# Patient Record
Sex: Female | Born: 1965 | Race: White | Hispanic: No | Marital: Married | Smoking: Former smoker
Health system: Southern US, Community
[De-identification: ages and names within clinical notes are randomized; demographics above are authoritative.]

## PROBLEM LIST (undated history)

## (undated) DIAGNOSIS — M2550 Pain in unspecified joint: Secondary | ICD-10-CM

## (undated) DIAGNOSIS — K829 Disease of gallbladder, unspecified: Secondary | ICD-10-CM

## (undated) DIAGNOSIS — Z972 Presence of dental prosthetic device (complete) (partial): Secondary | ICD-10-CM

## (undated) DIAGNOSIS — Z973 Presence of spectacles and contact lenses: Secondary | ICD-10-CM

## (undated) DIAGNOSIS — M1990 Unspecified osteoarthritis, unspecified site: Secondary | ICD-10-CM

## (undated) DIAGNOSIS — S83206A Unspecified tear of unspecified meniscus, current injury, right knee, initial encounter: Secondary | ICD-10-CM

## (undated) DIAGNOSIS — Z9889 Other specified postprocedural states: Secondary | ICD-10-CM

## (undated) DIAGNOSIS — R112 Nausea with vomiting, unspecified: Secondary | ICD-10-CM

## (undated) DIAGNOSIS — C50112 Malignant neoplasm of central portion of left female breast: Principal | ICD-10-CM

## (undated) DIAGNOSIS — F329 Major depressive disorder, single episode, unspecified: Secondary | ICD-10-CM

## (undated) DIAGNOSIS — E119 Type 2 diabetes mellitus without complications: Secondary | ICD-10-CM

## (undated) DIAGNOSIS — H3320 Serous retinal detachment, unspecified eye: Secondary | ICD-10-CM

## (undated) DIAGNOSIS — K589 Irritable bowel syndrome without diarrhea: Secondary | ICD-10-CM

## (undated) DIAGNOSIS — R7303 Prediabetes: Secondary | ICD-10-CM

## (undated) DIAGNOSIS — R3915 Urgency of urination: Secondary | ICD-10-CM

## (undated) DIAGNOSIS — Z8742 Personal history of other diseases of the female genital tract: Secondary | ICD-10-CM

## (undated) DIAGNOSIS — F32A Depression, unspecified: Secondary | ICD-10-CM

## (undated) DIAGNOSIS — F411 Generalized anxiety disorder: Secondary | ICD-10-CM

## (undated) DIAGNOSIS — D649 Anemia, unspecified: Secondary | ICD-10-CM

## (undated) DIAGNOSIS — H269 Unspecified cataract: Secondary | ICD-10-CM

## (undated) DIAGNOSIS — F419 Anxiety disorder, unspecified: Secondary | ICD-10-CM

## (undated) DIAGNOSIS — I10 Essential (primary) hypertension: Secondary | ICD-10-CM

## (undated) DIAGNOSIS — N83209 Unspecified ovarian cyst, unspecified side: Secondary | ICD-10-CM

## (undated) HISTORY — PX: LAPAROSCOPIC APPENDECTOMY: SUR753

## (undated) HISTORY — DX: Malignant neoplasm of central portion of left female breast: C50.112

## (undated) HISTORY — DX: Disease of gallbladder, unspecified: K82.9

## (undated) HISTORY — DX: Pain in unspecified joint: M25.50

## (undated) HISTORY — DX: Anemia, unspecified: D64.9

## (undated) HISTORY — PX: APPENDECTOMY: SHX54

## (undated) HISTORY — PX: LAPAROSCOPIC CHOLECYSTECTOMY: SUR755

## (undated) HISTORY — PX: BILATERAL TOTAL MASTECTOMY WITH AXILLARY LYMPH NODE DISSECTION: SHX6364

## (undated) HISTORY — DX: Unspecified osteoarthritis, unspecified site: M19.90

## (undated) HISTORY — DX: Serous retinal detachment, unspecified eye: H33.20

## (undated) HISTORY — PX: ACHILLES TENDON REPAIR: SUR1153

## (undated) HISTORY — DX: Anxiety disorder, unspecified: F41.9

## (undated) HISTORY — PX: OTHER SURGICAL HISTORY: SHX169

## (undated) HISTORY — DX: Irritable bowel syndrome, unspecified: K58.9

## (undated) HISTORY — PX: ELBOW SURGERY: SHX618

---
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
## 1989-09-08 HISTORY — PX: TUBAL LIGATION: SHX77

## 1996-05-09 HISTORY — PX: TOTAL ABDOMINAL HYSTERECTOMY: SHX209

## 1996-05-09 HISTORY — PX: ABDOMINAL HYSTERECTOMY: SHX81

## 1998-08-10 ENCOUNTER — Emergency Department (HOSPITAL_COMMUNITY): Admission: EM | Admit: 1998-08-10 | Discharge: 1998-08-10 | Payer: Self-pay | Admitting: Unknown Physician Specialty

## 1998-09-08 HISTORY — PX: BLADDER SUSPENSION: SHX72

## 1999-10-04 ENCOUNTER — Inpatient Hospital Stay (HOSPITAL_COMMUNITY): Admission: AD | Admit: 1999-10-04 | Discharge: 1999-10-04 | Payer: Self-pay | Admitting: Obstetrics & Gynecology

## 2000-04-10 ENCOUNTER — Emergency Department (HOSPITAL_COMMUNITY): Admission: EM | Admit: 2000-04-10 | Discharge: 2000-04-10 | Payer: Self-pay | Admitting: Emergency Medicine

## 2000-10-23 ENCOUNTER — Emergency Department (HOSPITAL_COMMUNITY): Admission: EM | Admit: 2000-10-23 | Discharge: 2000-10-23 | Payer: Self-pay | Admitting: Emergency Medicine

## 2001-05-07 ENCOUNTER — Other Ambulatory Visit: Admission: RE | Admit: 2001-05-07 | Discharge: 2001-05-07 | Payer: Self-pay | Admitting: Obstetrics and Gynecology

## 2004-10-29 ENCOUNTER — Ambulatory Visit: Payer: Self-pay | Admitting: Internal Medicine

## 2004-11-14 ENCOUNTER — Ambulatory Visit: Payer: Self-pay | Admitting: Internal Medicine

## 2004-12-25 ENCOUNTER — Ambulatory Visit: Payer: Self-pay | Admitting: Internal Medicine

## 2004-12-26 ENCOUNTER — Ambulatory Visit (HOSPITAL_COMMUNITY): Admission: RE | Admit: 2004-12-26 | Discharge: 2004-12-26 | Payer: Self-pay | Admitting: Family Medicine

## 2005-01-31 ENCOUNTER — Ambulatory Visit: Payer: Self-pay | Admitting: Family Medicine

## 2005-02-19 ENCOUNTER — Ambulatory Visit (HOSPITAL_COMMUNITY): Admission: RE | Admit: 2005-02-19 | Discharge: 2005-02-19 | Payer: Self-pay | Admitting: *Deleted

## 2008-09-08 HISTORY — PX: ACHILLES TENDON REPAIR: SUR1153

## 2009-09-03 ENCOUNTER — Encounter: Admission: RE | Admit: 2009-09-03 | Discharge: 2009-09-03 | Payer: Self-pay | Admitting: Family Medicine

## 2010-09-05 ENCOUNTER — Encounter
Admission: RE | Admit: 2010-09-05 | Discharge: 2010-09-05 | Payer: Self-pay | Source: Home / Self Care | Attending: Family Medicine | Admitting: Family Medicine

## 2011-01-24 NOTE — Group Therapy Note (Signed)
NAME:  Christina [name], Christina [name] NO.:  [id number]   MEDICAL RECORD NO.:  [id number]          PATIENT TYPE:  WOC   LOCATION:  [facility]                   FACILITY:  [facility]   PHYSICIAN:  Tinnie Gens, MD        DATE OF BIRTH:  16-May-1966   DATE OF SERVICE:                                    CLINIC NOTE   CHIEF COMPLAINT:  Right ovarian cyst.   HISTORY OF PRESENT ILLNESS:  The patient is a 45 year old gravida 4, para 3,  0-1-3, who is status post hysterectomy for endometriosis in 1996.  She has  had several recurrent problems with ovarian cysts, the latest of which was  diagnosed in April and was found to be a right-sided complex 5.9 x 4.9 x 4.2  cm cyst with clot in it.  The patient states she had acute onset of pain  then, but has gotten somewhat better and actually is asymptomatic at  present.  The patient is just wondering if there is something she can do  about this because it continues to be a problem.   The patient additionally today, is complaining of some difficulty with  weight loss, despite a lot of different methods that she has tried. She  states that she walks a lot and drinks Pepsi.  But some of it is diet.  She  also snacks, but only a few times a week.  She has tried Dexatrim and other  over-the-counter methods and has not been able to lose enough weight.   The patient also reports some leakage of fluid and incontinence, especially  with sneezing and coughing, but she does have problems with some urge  incontinence and what sounds like bladder spasm as well.   PAST MEDICAL HISTORY:  1.  Elevated blood pressure.  2.  Possibly and episode of PVCs.   PAST SURGICAL HISTORY:  1.  C section x3.  2.  Hysterectomy.  3.  Gallbladder removed.  4.  Several laparoscopies for different things.   MEDICATIONS:  None.   ALLERGIES:  None known.   GYNECOLOGIC HISTORY:  1.  Menarche at age 89.  2.  No more cycles secondary to hysterectomy.  3.  History of  endometriosis  4.  No history of abnormal Pap.   OBSTETRIC HISTORY:  1.  G4, P3.  2.  Three previous C sections.   FAMILY HISTORY:  Diabetes, coronary artery disease, hypertension.   SOCIAL HISTORY:  No tobacco, alcohol or drug use.  She does work.   A 14 point review of systems is reviewed and is positive for swelling in the  legs, problems with vision, some chest pain that she sees the outpatient  clinic for, loss of urine with coughing, sneezing and pain with intercourse.  Otherwise, this is negative.   PHYSICAL EXAMINATION:  VITAL SIGNS:  As noted in the chart.  Her weight is  215 pounds.  GENERAL:  She is a well-developed, well-nourished, moderately obese white  female in no acute distress.  GU:  Normal external female genitalia. The vagina is pink and rugated.  The  cervix and uterus  are surgically absent.  On bimanual exam, there is no  significant mass or tenderness noted.  With Valsalva, there is no  significant descent of the vaginal cuff.  No cystocele nor rectocele was  appreciated.   IMPRESSION:  1.  Recurrent ovarian cysts, hemorrhagic in nature, asymptomatic at present.  2.  History of endometriosis.  3.  Urinary incontinence.   PLAN:  1.  Will follow up her pelvic ultrasound to make sure the cyst as resolved.  2.  Start Mircette to promote ovarian quiescence.  3.  The patient is to see a urologist.  4.  Lots of weight loss techniques were discussed with this patient.  15      minutes were used to talk about her current diet and exercise, muscle      building types of exercise.  5.  The patient, who had a hysterectomy for nondysplastic or cervical cancer      reasons, does not need further vaginal cytologies.  6.  The patient will followup with me in three months to see how her birth      control is doing.  I did advise her that this may increase her blood      pressure.  She will continue to see Dr. Elvin So for followup and      treatment of this.       TP/MEDQ  D:  01/31/2005  T:  01/31/2005  Job:  [id number]   cc:   Cassandria Anger, Dr  Outpatient Clinic

## 2011-02-12 ENCOUNTER — Other Ambulatory Visit: Payer: Self-pay | Admitting: Obstetrics & Gynecology

## 2011-02-12 DIAGNOSIS — R1900 Intra-abdominal and pelvic swelling, mass and lump, unspecified site: Secondary | ICD-10-CM

## 2011-02-17 ENCOUNTER — Other Ambulatory Visit: Payer: Self-pay

## 2011-02-18 ENCOUNTER — Ambulatory Visit
Admission: RE | Admit: 2011-02-18 | Discharge: 2011-02-18 | Disposition: A | Discharge status: Home / Self Care | Payer: Self-pay | Source: Ambulatory Visit | Attending: Obstetrics & Gynecology | Admitting: Obstetrics & Gynecology

## 2011-02-18 DIAGNOSIS — R1900 Intra-abdominal and pelvic swelling, mass and lump, unspecified site: Secondary | ICD-10-CM

## 2011-02-18 MED ORDER — GADOBENATE DIMEGLUMINE 529 MG/ML IV SOLN
15.0000 mL | Freq: Once | INTRAVENOUS | Status: AC | PRN
  Administered 2011-02-18: 15 mL via INTRAVENOUS

## 2011-03-11 ENCOUNTER — Ambulatory Visit (INDEPENDENT_AMBULATORY_CARE_PROVIDER_SITE_OTHER): Payer: BC Managed Care – PPO | Admitting: Surgery

## 2011-03-11 ENCOUNTER — Encounter (INDEPENDENT_AMBULATORY_CARE_PROVIDER_SITE_OTHER): Payer: Self-pay | Admitting: Surgery

## 2011-03-11 VITALS — BP 122/86 | HR 58 | Temp 98.1°F | Ht 67.0 in | Wt 174.6 lb

## 2011-03-11 DIAGNOSIS — N809 Endometriosis, unspecified: Secondary | ICD-10-CM | POA: Insufficient documentation

## 2011-03-11 DIAGNOSIS — K388 Other specified diseases of appendix: Secondary | ICD-10-CM

## 2011-03-11 DIAGNOSIS — K389 Disease of appendix, unspecified: Secondary | ICD-10-CM

## 2011-03-11 NOTE — Patient Instructions (Signed)
Schedule for surgery to laparoscopically explore abdomen and remove appendix. Appendectomy, Adult Care Before and After Surgery Once the diagnosis of appendiceal mucocele, the procedure is to remove the appendix. This is done surgically by making a cut (incision) in the right lower belly. This is most often done under a general anesthetic so that you are sleeping during the procedure. Following the procedure, you will be taken to a recovery room. Once you have recovered from the anesthetic you will be returned to your room. Your caregiver will tell you how long you will stay in the hospital. Following the procedure you will be given pain medications to keep you comfortable. BEFORE SURGERY LET YOUR CAREGIVERS KNOW ABOUT THE FOLLOWING:  Allergies including aspirin and latex.  Medications taken including herbs, eye drops, over the counter medications, and creams.   Use of steroids (by mouth or creams).   Previous problems with anesthetics or novocain.   Previous surgery.   Possibility of pregnancy, if this applies.  History of blood clots (thrombophlebitis).   History of bleeding or blood problems.   Other health problems.   AFTER YOUR SURGERY After surgery, you will be taken to the recovery area where a nurse will watch you and check your progress. Once you're awake, stable, and taking fluids well, without other problems you'll be returned to your room or allowed to go home. HOME CARE INSTRUCTIONS AFTER YOUR SURGERY (APPENDECTOMY)  Once you are home an ice pack applied to the operative site may help with discomfort and keep swelling down. Use ice for ONLY 15-20 minutes at a time, with rest periods of 2-3 hours between uses. Never place the ice pack directly on your skin; use a towel between the ice pack and your skin.   Change dressings as directed.   Only take over-the-counter or prescription medicines for pain, discomfort, or fever as directed by your caregiver.   You may continue  normal diet as directed.   There should be no heavy lifting (more than 10 pounds) or contact sports for three weeks, or as directed.   Keep the wound dry and clean. The wound may be washed gently with soap and water. Gently blot or dab dry following cleansing without rubbing. Do not take baths, use swimming pools or use hot tubs for ten days, or as instructed by your caregiver.  SEEK MEDICAL CARE IF:  There is redness, swelling, or increasing pain in the wound area.   Pus is coming from the wound.   An unexplained oral temperature above 101 F (38.3 C) develops.   You notice a foul smell coming from the wound or dressing.   The wound edges break open after sutures or staples have been removed.   You develop increasing abdominal pain.  SEEK IMMEDIATE MEDICAL CARE IF:  You develop a rash.   You have difficulty breathing, or develop any reaction or side effects to medications given.  Document Released: 04/08/2004 Document Re-Released: 02/12/2010 [facility] Patient Information 2011 [place], [place].

## 2011-03-11 NOTE — Progress Notes (Signed)
Subjective:     Patient ID: Christina [name], female   DOB: 02/27/1966, 45 y.o.   MRN: [id number]    BP 122/86  Pulse 58  Temp 98.1 F (36.7 C)  Ht 5\' 7"  (1.702 m)  Wt 174 lb 9.6 oz (79.198 kg)  BMI 27.35 kg/m2    HPI  Primary care physician: Leda Quail  Reason for visit: Suprapubic pain with cystic mass. ? Appendiceal mucocele.  Patient is a healthy middle-aged woman. She's had history of endometriosis status post hysterectomy. She has a bowel movement every day, however she was told she has irritable bowel syndrome and gets constipation about one time a month where she won't have about 3 days but usually resolves.  No rectal bleeding or discharge no nausea or vomiting. She notes she does get some suprapubic pain occasionally. It's not associated with eating nor activity. She feel it with pressure applied.  Not a severe sharp stabbing pain. More of a crampy ache. She has lost about 40 pounds but this is been intentional. She's getting more physically active.  Because she noticed this lower abdominal pain, she saw Dr. Hyacinth Meeker. U/S pelvis and noted mild cystic ovaries. However there is a fluid filled structure suprapubic that was of concern. An MRI is suspicious for an appendiceal mucocele. She was sent to me by Dr. Hyacinth Meeker for consideration of surgical removal.  Review of Systems  Constitutional: Negative for fever, chills, diaphoresis, appetite change and fatigue.  HENT: Negative for nosebleeds, sore throat, mouth sores, neck pain and neck stiffness.   Eyes: Negative for photophobia, discharge and visual disturbance.  Respiratory: Negative for cough, choking, chest tightness and shortness of breath.   Cardiovascular: Negative for chest pain and palpitations.  Gastrointestinal: Positive for constipation. Negative for nausea, vomiting, abdominal pain, diarrhea, blood in stool, abdominal distention, anal bleeding and rectal pain.       Constipation 1x/month (No BM for ~3d)    Genitourinary: Negative for dysuria, frequency, flank pain, vaginal bleeding, vaginal discharge and difficulty urinating.  Musculoskeletal: Negative for back pain, arthralgias and gait problem.  Skin: Negative for color change, pallor and rash.  Neurological: Negative for dizziness, speech difficulty, weakness and numbness.  Hematological: Negative for adenopathy. Does not bruise/bleed easily.  Psychiatric/Behavioral: Negative for confusion and agitation. The patient is not nervous/anxious.        Objective:   Physical Exam  Constitutional: She is oriented to person, place, and time. She appears well-developed and well-nourished. No distress.  HENT:  Head: Normocephalic.  Mouth/Throat: Oropharynx is clear and moist. No oropharyngeal exudate.  Eyes: Conjunctivae and EOM are normal. Pupils are equal, round, and reactive to light. No scleral icterus.  Neck: Normal range of motion. Neck supple. No tracheal deviation present.  Cardiovascular: Normal rate, regular rhythm and intact distal pulses.   Pulmonary/Chest: Effort normal and breath sounds normal. No respiratory distress. She exhibits no tenderness.  Abdominal: Soft. Bowel sounds are normal. She exhibits no distension and no mass. There is tenderness. There is no rebound and no guarding. Hernia confirmed negative in the right inguinal area and confirmed negative in the left inguinal area.       Mild suprapubic TTP.  Pfannenstiel incision clean. No hernia.  Genitourinary: No vaginal discharge found.  Musculoskeletal: Normal range of motion. She exhibits no tenderness.  Lymphadenopathy:    She has no cervical adenopathy.       Right: No inguinal adenopathy present.       Left: No inguinal adenopathy present.  Neurological: She is alert and oriented to person, place, and time. No cranial nerve deficit. She exhibits normal muscle tone. Coordination normal.  Skin: Skin is warm and dry. No rash noted. She is not diaphoretic. No erythema.   Psychiatric: She has a normal mood and affect. Her behavior is normal. Judgment and thought content normal.       Assessment:     Cystic mass in suprapubic region by MRI and ultrasound. Seems be coming off cecum, strongly suspicious for appendiceal mucocele.    Plan:     At this point I think it would be wise to do diagnostic laparoscopy, lysis of adhesions, and remove the appendix. I did note that the possibility of this cystic mass is not coming off the appendix but other structures. There's a possibility the right ovary or hydrosalpinx or salpinx and may need to be removed at the same time. However the left ovary seemed free and I will try to avoid using that.  The differential diagnosis was discussed. The technique of surgery was discussed. Risks benefits alternatives discussed. I will try to minimize risk of perforation and therefore some peritonitis pseudomyxei. Questions answered and she agreed to proceed . We will work to coordinate this in a convenient time.

## 2011-03-13 ENCOUNTER — Telehealth (INDEPENDENT_AMBULATORY_CARE_PROVIDER_SITE_OTHER): Payer: Self-pay

## 2011-03-14 ENCOUNTER — Telehealth (INDEPENDENT_AMBULATORY_CARE_PROVIDER_SITE_OTHER): Payer: Self-pay | Admitting: Surgery

## 2011-03-14 ENCOUNTER — Telehealth (INDEPENDENT_AMBULATORY_CARE_PROVIDER_SITE_OTHER): Payer: Self-pay

## 2011-03-17 NOTE — Telephone Encounter (Signed)
Spoke with patient, made aware that with no heavy lifting at her job she could return a week after surgery, sometimes up to two weeks. She will get RTW note after surgery and call with any other questions.

## 2011-04-01 NOTE — Telephone Encounter (Signed)
Duplicate

## 2011-04-01 NOTE — Telephone Encounter (Signed)
Patient would like to know how long she will be out of work due to [employer] append ???

## 2011-04-02 ENCOUNTER — Other Ambulatory Visit (INDEPENDENT_AMBULATORY_CARE_PROVIDER_SITE_OTHER): Payer: Self-pay | Admitting: Surgery

## 2011-04-02 ENCOUNTER — Encounter (HOSPITAL_COMMUNITY): Payer: BC Managed Care – PPO

## 2011-04-02 LAB — CBC
HCT: 40.5 % (ref 36.0–46.0)
Hemoglobin: 13 g/dL (ref 12.0–15.0)
MCH: 28.3 pg (ref 26.0–34.0)
MCHC: 32.1 g/dL (ref 30.0–36.0)
MCV: 88.2 fL (ref 78.0–100.0)
Platelets: 288 10*3/uL (ref 150–400)
RBC: 4.59 MIL/uL (ref 3.87–5.11)
RDW: 12.9 % (ref 11.5–15.5)
WBC: 8.6 10*3/uL (ref 4.0–10.5)

## 2011-04-02 LAB — SURGICAL PCR SCREEN
MRSA, PCR: NEGATIVE
Staphylococcus aureus: NEGATIVE

## 2011-04-07 ENCOUNTER — Other Ambulatory Visit (INDEPENDENT_AMBULATORY_CARE_PROVIDER_SITE_OTHER): Payer: Self-pay | Admitting: Surgery

## 2011-04-07 ENCOUNTER — Ambulatory Visit (HOSPITAL_COMMUNITY)
Admission: RE | Admit: 2011-04-07 | Discharge: 2011-04-07 | Disposition: A | Discharge status: Home / Self Care | Payer: BC Managed Care – PPO | Source: Ambulatory Visit | Attending: Surgery | Admitting: Surgery

## 2011-04-07 DIAGNOSIS — D126 Benign neoplasm of colon, unspecified: Secondary | ICD-10-CM

## 2011-04-07 DIAGNOSIS — D201 Benign neoplasm of soft tissue of peritoneum: Secondary | ICD-10-CM

## 2011-04-07 DIAGNOSIS — Z90710 Acquired absence of both cervix and uterus: Secondary | ICD-10-CM | POA: Insufficient documentation

## 2011-04-07 DIAGNOSIS — N736 Female pelvic peritoneal adhesions (postinfective): Secondary | ICD-10-CM | POA: Insufficient documentation

## 2011-04-07 DIAGNOSIS — D200 Benign neoplasm of soft tissue of retroperitoneum: Secondary | ICD-10-CM

## 2011-04-07 DIAGNOSIS — N949 Unspecified condition associated with female genital organs and menstrual cycle: Secondary | ICD-10-CM | POA: Insufficient documentation

## 2011-04-07 DIAGNOSIS — D490 Neoplasm of unspecified behavior of digestive system: Secondary | ICD-10-CM | POA: Insufficient documentation

## 2011-04-07 DIAGNOSIS — Z01812 Encounter for preprocedural laboratory examination: Secondary | ICD-10-CM | POA: Insufficient documentation

## 2011-04-09 NOTE — Op Note (Signed)
[name], [name] NO.:  [id number]  MEDICAL RECORD NO.:  [id number]  LOCATION:  [facility]                         FACILITY:  [facility]  PHYSICIAN:  Ardeth Sportsman, MD     DATE OF BIRTH:  [birth year]  DATE OF PROCEDURE:  04/07/2011 DATE OF DISCHARGE:                              OPERATIVE REPORT   PRIMARY CARE PHYSICIAN:  Gynecology; Lum Keas, MD  SURGEON:  Ardeth Sportsman, MD  ASSISTANT:  RN  PREOPERATIVE DIAGNOSES: 1. 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 2. Pelvic pain status post hysterectomy and removal for endometriosis     in the past.  POSTOPERATIVE DIAGNOSES: 1. 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 2. No evidence of pseudomyxoma peritonei. 3. Firm peritoneal mass of right sigmoid mesentery (? atrophic or     endometrioma). 4. Dense adhesions in pelvis, status post prior surgery.  PROCEDURE PERFORMED: 1. Lysis of adhesions for 90 minutes. 2. Laparoscopic appendectomy. 3. Excision of peritoneal mass, 5 mm in size.  ANESTHESIA: 1. General anesthesia. 2. Local anesthetic as field block around port sites.  SPECIMENS: 1. Appendix. 2. Peritoneal mass of sigmoid mesentery.  ESTIMATED BLOOD LOSS:  Minimal.  DRAINS:  None.  COMPLICATIONS:  None apparent.  INDICATIONS:  Christina [name] is a 45 year old healthy female who has had history of endometriosis status post hysterectomy.  She does have a history of some intermittent constipation.  She often will get some suprapubic pain.  She had a workup including an ultrasound and MRI, which showed a cystic mass within the appendix suspicious for a mucocele.  She had a small right ovarian cyst that was not too impressive.  Dr. Hyacinth Meeker sent the patient to me for evaluation.  The anatomy and physiology of the digestive tract was explained. Differential diagnosis discussed.  Options discussed.  Recommendation was made for diagnostic laparoscopy with probable removal of appendix. Possibility of other  etiologies and other problems were discussed. Risks, benefits, and alternatives were discussed.  Questions were answered and she agreed to proceed.  OPERATIVE FINDINGS:  She had dense adhesions to the pelvis.  She had a redundant sigmoid colon that was sort of an S-shaped tortuousness with part of it adherent down into the low pelvis.  She had a 5 mm firm spherical mass of the sigmoid mesentery at the base of the right mesentery near the sacral promontory.  It looked almost sort of as a coverless endometrioma.  I did not see endometriosis elsewhere.  I did not see pseudomyxoma peritonei elsewhere.  DESCRIPTION OF PROCEDURE:  Informed consent was confirmed.  The patient underwent general anesthesia without any difficulty.  She received IV antibiotics.  She was positioned in low lithotomy with her arms tucked. Her abdomen was prepped and draped in a sterile fashion.  She had a Foley catheter sterilely placed.  Surgical timeout confirmed our plan.  I placed a 5 mm port through the prior infraumbilical incision using a modified cutdown and optical entry technique.  The entry was clean. Again, inspection revealed no injury.  I induced carbon dioxide insufflation.  Under direct visualization, I placed 5 mm ports in the left lower quadrant and left upper quadrant since she had dense adhesions to  her periumbilical midline and pelvis.  I began with dissection to free the greater omental attachments off the anterior abdominal wall from supraumbilical down towards the suprapubic rim.  I then found freeing that up there were dense adhesions to the sigmoid colon, cecum and small bowel and gradually isolated and freed those off using primarily cold scissors.  There were some thicker bands, so I did use an ultrasonic Harmonic scalpel to help free those off and once I had done that, I freed small intestine off the adhesions to the sigmoid mesentery, pelvis, and adnexa.  With that, I could see a  blind loop leaving the cecum going over towards the left lower quadrant consistent with the appendix.  It was thickened in the middle suspicious for a small mucocele within in.  I did not see any drops of mucus or gelatinous material.  There was no evidence of pseudomyxoma peritonei.  Because it did not look particularly inflamed or kinked and she had chronic pelvic pain and she had dense adhesions to the pelvis, I decided to proceed with lysis of adhesions.  I found sigmoid mesentery and freed that off the left ovary.  It was rather densely adherent to it.  I was able to eventually free that off.  I then freed some colon off first the left ovary, then the right ovary, as well as her severe dense adhesions to that.  The right ovary was cystic and about 3 x 2 x 4 cm in size. However, there was no firm abnormality.  The fimbriae and the fallopian tubes all looked normal.  She is obviously status post hysterectomy.  I began to free up the sigmoid and rectum adhesions off the sidewalls. There were some dense inflammatory adhesions, but I was able to stay off that and free those off and the easier ones, I got them off the ovary where the most dense adhesions were.  With that, I could get over to the peritoneal brim, I could help free the prior hysterectomy vaginal cuff off the anterior rectum as well and get down into the true deep pelvis and that was all clean.  There was 1 dense adhesion of sigmoid down into the deep anterior pelvis and I isolated that and freed that off using cold scissors.  With that, I finally could release the rectosigmoid out of the pelvis.  It was rather a S-shaped dense wad.  I eventually freed up from the mesentery side up to the bowel wall serosa.  I was able to free these intraloop of folded adhesions and help straight out the rectosigmoid.  It was mildly redundant but with that, I could better see the sigmoid colon.  She had some generous epiploic appendages,  but eventually freed everything off.  I did meticulous inspection numerous times and saw no evidence of any serosal tears or any abnormalities of the ovaries or any bowel breach.  I then proceeded with appendectomy.  I was able to isolate the appendix and free the mesoappendix off the appendix.  The entrance end of the cecum was normal sized and more narrow.  I transected the appendix off the cecum using a laparoscopic stapler, sticking a healthy cuff of 5 mm of cecum easily.  I replaced that in Endocatch bag and removed that.  Of note when I was doing dissection, I saw a hard spherical nodule coming off the sigmoid mesentery on the right side and about the level of the sacral promontory.  I was able to elevate  that and free that off using scissors and removed that.  I sent that off.  I saw no other studding, no other mucous deposits or any other abnormalities.  I had upsized the umbilical port to 12 mm for the stapling.  I closed that defect using 0 Vicryl stitch using laparoscopic suture passer.  I did copious irrigation and hemostasis was excellent.  I irrigated over 2 L.  I evacuated carbon dioxide from the ports and closed the tight fascial stitch down and closed the skin.  Sterile dressing applied.  The patient was extubated and sent to recovery room in stable condition. I discussed postoperative care with the patient in detail and I am about to discuss with her family now.     Ardeth Sportsman, MD     SCG/MEDQ  D:  04/07/2011  T:  04/07/2011  Job:  [id number]  cc:   M. Leda Quail, MD Fax: [phone]  Electronically Signed by Karie Soda MD on 04/09/2011 08:56:35 AM

## 2011-04-15 ENCOUNTER — Telehealth (INDEPENDENT_AMBULATORY_CARE_PROVIDER_SITE_OTHER): Payer: Self-pay

## 2011-04-15 NOTE — Telephone Encounter (Signed)
Called pt to get her set up for follow up appt with Dr Michaell Cowing. The pt made an appt for 04-29-11.Hulda Humphrey

## 2011-04-29 ENCOUNTER — Ambulatory Visit (INDEPENDENT_AMBULATORY_CARE_PROVIDER_SITE_OTHER): Payer: BC Managed Care – PPO | Admitting: Surgery

## 2011-04-29 ENCOUNTER — Encounter (INDEPENDENT_AMBULATORY_CARE_PROVIDER_SITE_OTHER): Payer: Self-pay | Admitting: Surgery

## 2011-04-29 VITALS — BP 116/78 | HR 64 | Temp 98.1°F | Wt 174.6 lb

## 2011-04-29 DIAGNOSIS — K388 Other specified diseases of appendix: Secondary | ICD-10-CM

## 2011-04-29 DIAGNOSIS — K389 Disease of appendix, unspecified: Secondary | ICD-10-CM

## 2011-04-29 DIAGNOSIS — D259 Leiomyoma of uterus, unspecified: Secondary | ICD-10-CM

## 2011-04-29 NOTE — Progress Notes (Signed)
Subjective:     Patient ID: Christina [name], female   DOB: 1965/12/27, 45 y.o.   MRN: [id number]  HPI  Reason for visit: Followup after surgery.  The patient feels well. Has a little bit gassy and bloating this. Improving. Daily bowel movements. Off all meds. Past Medical History  Diagnosis Date  . Arthritis     hands and knees   Past Surgical History  Procedure Date  . Cesarean section   . Cholecystectomy   . Achilles tendon repair   . Tubal ligation   . Abdominal hysterectomy   . Appendectomy Aug2012    Early mucocele of appendix, removal of small leiomyoma from pelvis, lysis of adhesions   Current outpatient prescriptions:Sertraline HCl (ZOLOFT PO), Take 75 mg by mouth daily.  , Disp: , Rfl:   No Known Allergies    Review of Systems  Constitutional: Negative for fever, chills and diaphoresis.  HENT: Negative for ear pain, sore throat and trouble swallowing.   Eyes: Negative for photophobia and visual disturbance.  Respiratory: Negative for cough and choking.   Cardiovascular: Negative for chest pain and palpitations.  Gastrointestinal: Negative for nausea, vomiting, abdominal pain, diarrhea, constipation, blood in stool, anal bleeding and rectal pain.  Genitourinary: Negative for dysuria, frequency and difficulty urinating.  Musculoskeletal: Negative for myalgias and gait problem.  Skin: Negative for color change, pallor and rash.  Neurological: Negative for dizziness, speech difficulty, weakness and numbness.  Hematological: Negative for adenopathy.  Psychiatric/Behavioral: Negative for confusion and agitation. The patient is not nervous/anxious.        Objective:   Physical Exam  Constitutional: She is oriented to person, place, and time. She appears well-developed and well-nourished. No distress.  HENT:  Head: Normocephalic.  Mouth/Throat: Oropharynx is clear and moist. No oropharyngeal exudate.  Eyes: Conjunctivae and EOM are normal. Pupils are equal, round,  and reactive to light. No scleral icterus.  Neck: Normal range of motion. No tracheal deviation present.  Cardiovascular: Normal rate and intact distal pulses.   Pulmonary/Chest: Effort normal. No respiratory distress. She exhibits no tenderness.  Abdominal: Soft. She exhibits no distension. There is no tenderness. Hernia confirmed negative in the right inguinal area and confirmed negative in the left inguinal area.       Incisions clean with normal healing ridges.  No hernias  Musculoskeletal: Normal range of motion. She exhibits no tenderness.  Lymphadenopathy:       Right: No inguinal adenopathy present.       Left: No inguinal adenopathy present.  Neurological: She is alert and oriented to person, place, and time. No cranial nerve deficit. She exhibits normal muscle tone. Coordination normal.  Skin: Skin is warm and dry. No rash noted. She is not diaphoretic.  Psychiatric: She has a normal mood and affect. Her behavior is normal.       Assessment:     Status post removal of early mucocele the appendix, small lipoma of the pelvis, lysis of adhesions. Improving.    Plan:     Increase activities tolerated.  Consider bowel regimen with flax seed, active culture yogurt, and increased physical activity. That should help improve things.  Return to clinic p.r.n.  She was discussed at [facility]. We do not feel any further monitoring or intervention is required since it was very early with good margins.

## 2011-08-21 ENCOUNTER — Other Ambulatory Visit: Payer: Self-pay | Admitting: Family Medicine

## 2011-08-21 DIAGNOSIS — Z1231 Encounter for screening mammogram for malignant neoplasm of breast: Secondary | ICD-10-CM

## 2011-09-10 ENCOUNTER — Ambulatory Visit
Admission: RE | Admit: 2011-09-10 | Discharge: 2011-09-10 | Disposition: A | Discharge status: Home / Self Care | Payer: BC Managed Care – PPO | Source: Ambulatory Visit | Attending: Family Medicine | Admitting: Family Medicine

## 2011-09-10 DIAGNOSIS — Z1231 Encounter for screening mammogram for malignant neoplasm of breast: Secondary | ICD-10-CM

## 2012-08-30 ENCOUNTER — Other Ambulatory Visit: Payer: Self-pay | Admitting: Family Medicine

## 2012-08-30 DIAGNOSIS — Z1231 Encounter for screening mammogram for malignant neoplasm of breast: Secondary | ICD-10-CM

## 2012-09-17 ENCOUNTER — Ambulatory Visit: Payer: BC Managed Care – PPO

## 2012-09-24 ENCOUNTER — Ambulatory Visit
Admission: RE | Admit: 2012-09-24 | Discharge: 2012-09-24 | Disposition: A | Discharge status: Home / Self Care | Payer: BC Managed Care – PPO | Source: Ambulatory Visit | Attending: Family Medicine | Admitting: Family Medicine

## 2012-09-24 DIAGNOSIS — Z1231 Encounter for screening mammogram for malignant neoplasm of breast: Secondary | ICD-10-CM

## 2013-02-21 ENCOUNTER — Encounter: Payer: Self-pay | Admitting: Nurse Practitioner

## 2013-02-21 ENCOUNTER — Ambulatory Visit (INDEPENDENT_AMBULATORY_CARE_PROVIDER_SITE_OTHER): Payer: BC Managed Care – PPO | Admitting: Nurse Practitioner

## 2013-02-21 VITALS — BP 112/70 | HR 68 | Resp 14 | Ht 66.5 in | Wt 197.2 lb

## 2013-02-21 DIAGNOSIS — Z0000 Encounter for general adult medical examination without abnormal findings: Secondary | ICD-10-CM

## 2013-02-21 DIAGNOSIS — Z01419 Encounter for gynecological examination (general) (routine) without abnormal findings: Secondary | ICD-10-CM

## 2013-02-21 LAB — POCT URINALYSIS DIPSTICK
Leukocytes, UA: NEGATIVE
Spec Grav, UA: 1.02
Urobilinogen, UA: NEGATIVE
pH, UA: 6.5

## 2013-02-21 LAB — HEMOGLOBIN, FINGERSTICK: Hemoglobin, fingerstick: 13.3 g/dL (ref 12.0–16.0)

## 2013-02-21 NOTE — Patient Instructions (Addendum)

## 2013-02-21 NOTE — Progress Notes (Signed)
47 y.o. G4, P3. Divorced Caucasian Fe here for annual exam.   She has had an episode of increased vaso symptoms at night that have been intolerable. They are only occasional.  Will monitor these symptoms and call back if worsens. Mother is having microdiskectomy this afternoon.  Her grandfather is in poor health and may be passing soon.   No LMP recorded. Patient has had a hysterectomy.          Sexually active: yes  The current method of family planning is status post hysterectomy.    Exercising: no  The patient does not participate in regular exercise at present. Smoker:  no  Health Maintenance: Pap:   MMG:  09/27/2012 normal TDaP:  08/2009 Labs: Hgb-13.7   reports that she has quit smoking. She has never used smokeless tobacco. She reports that she does not drink alcohol or use illicit drugs.  Past Medical History  Diagnosis Date  . Arthritis     hands and knees    Past Surgical History  Procedure Laterality Date  . Cesarean section    . Cholecystectomy    . Achilles tendon repair    . Tubal ligation    . Abdominal hysterectomy    . Appendectomy  Aug2012    Early mucocele of appendix, removal of small leiomyoma from pelvis, lysis of adhesions    Current Outpatient Prescriptions  Medication Sig Dispense Refill  . Sertraline HCl (ZOLOFT PO) Take 75 mg by mouth daily.         No current facility-administered medications for this visit.    No family history on file.  ROS:  Pertinent items are noted in HPI.  Otherwise, a comprehensive ROS was negative.  Exam:   Ht 5' 6.5" (1.689 m)  Wt 197 lb 3.2 oz (89.449 kg)  BMI 31.36 kg/m2 Height: 5' 6.5" (168.9 cm)  Ht Readings from Last 3 Encounters:  02/21/13 5' 6.5" (1.689 m)  03/11/11 5\' 7"  (1.702 m)    General appearance: alert, cooperative and appears stated age Head: Normocephalic, without obvious abnormality, atraumatic Neck: no adenopathy, supple, symmetrical, trachea midline and thyroid normal to inspection and  palpation Lungs: clear to auscultation bilaterally Breasts: normal appearance, no masses or tenderness Heart: regular rate and rhythm Abdomen: soft, non-tender; no masses,  no organomegaly Extremities: extremities normal, atraumatic, no cyanosis or edema Skin: Skin color, texture, turgor normal. No rashes or lesions Lymph nodes: Cervical, supraclavicular, and axillary nodes normal. No abnormal inguinal nodes palpated Neurologic: Grossly normal   Pelvic: External genitalia:  no lesions              Urethra:  normal appearing urethra with no masses, tenderness or lesions              Bartholin's and Skene's: normal                 Vagina: normal appearing vagina with normal color and discharge, no lesions              Cervix: absent              Pap taken: no Bimanual Exam:  Uterus:  uterus absent              Adnexa: no mass, fullness, tenderness               Rectovaginal: Confirms               Anus:  normal sphincter tone, no lesions  A:  Well Woman with normal exam  S/P TAH secondary to endometriosis 1997  P:   Pap smear as per guidelines   Mammogram due 1/ 2015  counseled on menopause, adequate intake of calcium and vitamin D,   diet and exercise  If vaso symptoms worsens to call back return annually or prn  An After Visit Summary was printed and given to the patient.

## 2013-02-25 NOTE — Progress Notes (Signed)
Encounter reviewed by Dr. Ramses Klecka Silva.  

## 2013-12-02 ENCOUNTER — Other Ambulatory Visit: Payer: Self-pay

## 2013-12-02 DIAGNOSIS — Z1231 Encounter for screening mammogram for malignant neoplasm of breast: Secondary | ICD-10-CM

## 2013-12-14 ENCOUNTER — Ambulatory Visit
Admission: RE | Admit: 2013-12-14 | Discharge: 2013-12-14 | Disposition: A | Discharge status: Home / Self Care | Payer: Self-pay | Source: Ambulatory Visit

## 2013-12-14 DIAGNOSIS — Z1231 Encounter for screening mammogram for malignant neoplasm of breast: Secondary | ICD-10-CM

## 2013-12-15 ENCOUNTER — Ambulatory Visit: Payer: BC Managed Care – PPO

## 2013-12-15 ENCOUNTER — Other Ambulatory Visit: Payer: Self-pay | Admitting: Family Medicine

## 2013-12-15 DIAGNOSIS — R928 Other abnormal and inconclusive findings on diagnostic imaging of breast: Secondary | ICD-10-CM

## 2013-12-29 ENCOUNTER — Ambulatory Visit
Admission: RE | Admit: 2013-12-29 | Discharge: 2013-12-29 | Disposition: A | Discharge status: Home / Self Care | Payer: BC Managed Care – PPO | Source: Ambulatory Visit | Attending: Family Medicine | Admitting: Family Medicine

## 2013-12-29 DIAGNOSIS — R928 Other abnormal and inconclusive findings on diagnostic imaging of breast: Secondary | ICD-10-CM

## 2013-12-30 ENCOUNTER — Other Ambulatory Visit: Payer: BC Managed Care – PPO

## 2014-07-10 ENCOUNTER — Encounter: Payer: Self-pay | Admitting: Nurse Practitioner

## 2014-07-15 ENCOUNTER — Encounter (HOSPITAL_COMMUNITY): Payer: Self-pay | Admitting: *Deleted

## 2014-07-15 ENCOUNTER — Emergency Department (HOSPITAL_COMMUNITY): Payer: BC Managed Care – PPO

## 2014-07-15 ENCOUNTER — Emergency Department (HOSPITAL_COMMUNITY)
Admission: EM | Admit: 2014-07-15 | Discharge: 2014-07-15 | Disposition: A | Discharge status: Home / Self Care | Payer: BC Managed Care – PPO | Attending: Emergency Medicine | Admitting: Emergency Medicine

## 2014-07-15 DIAGNOSIS — N8320 Unspecified ovarian cysts: Secondary | ICD-10-CM | POA: Insufficient documentation

## 2014-07-15 DIAGNOSIS — Z8739 Personal history of other diseases of the musculoskeletal system and connective tissue: Secondary | ICD-10-CM | POA: Insufficient documentation

## 2014-07-15 DIAGNOSIS — M549 Dorsalgia, unspecified: Secondary | ICD-10-CM | POA: Insufficient documentation

## 2014-07-15 DIAGNOSIS — N83202 Unspecified ovarian cyst, left side: Secondary | ICD-10-CM

## 2014-07-15 DIAGNOSIS — Z79899 Other long term (current) drug therapy: Secondary | ICD-10-CM | POA: Insufficient documentation

## 2014-07-15 DIAGNOSIS — N83201 Unspecified ovarian cyst, right side: Secondary | ICD-10-CM

## 2014-07-15 DIAGNOSIS — R1030 Lower abdominal pain, unspecified: Secondary | ICD-10-CM

## 2014-07-15 DIAGNOSIS — N200 Calculus of kidney: Secondary | ICD-10-CM

## 2014-07-15 DIAGNOSIS — Z87891 Personal history of nicotine dependence: Secondary | ICD-10-CM | POA: Insufficient documentation

## 2014-07-15 HISTORY — DX: Unspecified ovarian cyst, unspecified side: N83.209

## 2014-07-15 LAB — COMPREHENSIVE METABOLIC PANEL
ALT: 14 U/L (ref 0–35)
AST: 15 U/L (ref 0–37)
Albumin: 3 g/dL — ABNORMAL LOW (ref 3.5–5.2)
Alkaline Phosphatase: 50 U/L (ref 39–117)
Anion gap: 8 (ref 5–15)
BUN: 16 mg/dL (ref 6–23)
CO2: 26 mEq/L (ref 19–32)
Calcium: 9.8 mg/dL (ref 8.4–10.5)
Chloride: 107 mEq/L (ref 96–112)
Creatinine, Ser: 0.78 mg/dL (ref 0.50–1.10)
GFR calc Af Amer: >90 mL/min (ref >90)
GFR calc non Af Amer: >90 mL/min (ref >90)
Glucose, Bld: 97 mg/dL (ref 70–99)
Potassium: 3.9 mEq/L (ref 3.7–5.3)
Sodium: 141 mEq/L (ref 137–147)
Total Bilirubin: <0.2 mg/dL — ABNORMAL LOW (ref 0.3–1.2)
Total Protein: 6 g/dL (ref 6.0–8.3)

## 2014-07-15 LAB — CBC WITH DIFFERENTIAL/PLATELET
Basophils Absolute: 0 10*3/uL (ref 0.0–0.1)
Basophils Relative: 0 % (ref 0–1)
Eosinophils Absolute: 0.1 10*3/uL (ref 0.0–0.7)
Eosinophils Relative: 1 % (ref 0–5)
HCT: 40.6 % (ref 36.0–46.0)
Hemoglobin: 13.2 g/dL (ref 12.0–15.0)
Lymphocytes Relative: 29 % (ref 12–46)
Lymphs Abs: 2.9 10*3/uL (ref 0.7–4.0)
MCH: 28.4 pg (ref 26.0–34.0)
MCHC: 32.5 g/dL (ref 30.0–36.0)
MCV: 87.3 fL (ref 78.0–100.0)
Monocytes Absolute: 0.7 10*3/uL (ref 0.1–1.0)
Monocytes Relative: 7 % (ref 3–12)
Neutro Abs: 6.4 10*3/uL (ref 1.7–7.7)
Neutrophils Relative %: 63 % (ref 43–77)
Platelets: 237 10*3/uL (ref 150–400)
RBC: 4.65 MIL/uL (ref 3.87–5.11)
RDW: 13.8 % (ref 11.5–15.5)
WBC: 10.2 10*3/uL (ref 4.0–10.5)

## 2014-07-15 LAB — WET PREP, GENITAL
Clue Cells Wet Prep HPF POC: NONE SEEN
Trich, Wet Prep: NONE SEEN
WBC, Wet Prep HPF POC: NONE SEEN
Yeast Wet Prep HPF POC: NONE SEEN

## 2014-07-15 LAB — URINALYSIS, ROUTINE W REFLEX MICROSCOPIC
Bilirubin Urine: NEGATIVE
Glucose, UA: NEGATIVE mg/dL
Hgb urine dipstick: NEGATIVE
Ketones, ur: NEGATIVE mg/dL
Leukocytes, UA: NEGATIVE
Nitrite: NEGATIVE
Protein, ur: NEGATIVE mg/dL
Specific Gravity, Urine: 1.016 (ref 1.005–1.030)
Urobilinogen, UA: 0.2 mg/dL (ref 0.0–1.0)
pH: 6.5 (ref 5.0–8.0)

## 2014-07-15 LAB — LIPASE, BLOOD: Lipase: 45 U/L (ref 11–59)

## 2014-07-15 MED ORDER — IBUPROFEN 200 MG PO TABS
400.0000 mg | ORAL_TABLET | Freq: Once | ORAL | Status: AC
  Administered 2014-07-15: 400 mg via ORAL
  Filled 2014-07-15: qty 2

## 2014-07-15 MED ORDER — IOHEXOL 300 MG/ML  SOLN
100.0000 mL | Freq: Once | INTRAMUSCULAR | Status: AC | PRN
  Administered 2014-07-15: 100 mL via INTRAVENOUS

## 2014-07-15 MED ORDER — SODIUM CHLORIDE 0.9 % IV BOLUS (SEPSIS)
1000.0000 mL | Freq: Once | INTRAVENOUS | Status: AC
  Administered 2014-07-15: 1000 mL via INTRAVENOUS

## 2014-07-15 MED ORDER — IOHEXOL 300 MG/ML  SOLN
50.0000 mL | Freq: Once | INTRAMUSCULAR | Status: AC | PRN
  Administered 2014-07-15: 50 mL via ORAL

## 2014-07-15 MED ORDER — ONDANSETRON HCL 8 MG PO TABS: 8.0000 mg | ORAL_TABLET | Freq: Three times a day (TID) | ORAL | Status: DC | PRN

## 2014-07-15 MED ORDER — TAMSULOSIN HCL 0.4 MG PO CAPS: 0.4000 mg | ORAL_CAPSULE | Freq: Every day | ORAL | Status: DC

## 2014-07-15 MED ORDER — HYDROCODONE-ACETAMINOPHEN 5-325 MG PO TABS: 1.0000 | ORAL_TABLET | Freq: Four times a day (QID) | ORAL | Status: DC | PRN

## 2014-07-15 MED ORDER — NAPROXEN 500 MG PO TABS: 500.0000 mg | ORAL_TABLET | Freq: Two times a day (BID) | ORAL | Status: DC | PRN

## 2014-07-15 NOTE — ED Notes (Signed)
Pt reports hx of ovarian cyst, pt reports lower abdominal pain, lower back pain x1 month. Pain 7/10. Denies n/v/d.

## 2014-07-15 NOTE — Discharge Instructions (Signed)
Take naprosyn as directed as needed for pain using norco for breakthrough pain. Do not drive or operate machinery with pain medication use. If you develop symptoms of a kidney stone, use these pain medications, as well as zofran as needed for nausea and flomax to help pass the stone. Followup with your gynecologist in the next 1 to 2 weeks for recheck of ongoing pain, however for intractable or uncontrollable pain at home then return to the emergency department. If symptoms worsen or change, return to the ER.   Abdominal Pain, Women Abdominal (stomach, pelvic, or belly) pain can be caused by many things. It is important to tell your doctor:  The location of the pain.  Does it come and go or is it present all the time?  Are there things that start the pain (eating certain foods, exercise)?  Are there other symptoms associated with the pain (fever, nausea, vomiting, diarrhea)? All of this is helpful to know when trying to find the cause of the pain. CAUSES   Stomach: virus or bacteria infection, or ulcer.  Intestine: appendicitis (inflamed appendix), regional ileitis (Crohn's disease), ulcerative colitis (inflamed colon), irritable bowel syndrome, diverticulitis (inflamed diverticulum of the colon), or cancer of the stomach or intestine.  Gallbladder disease or stones in the gallbladder.  Kidney disease, kidney stones, or infection.  Pancreas infection or cancer.  Fibromyalgia (pain disorder).  Diseases of the female organs:  Uterus: fibroid (non-cancerous) tumors or infection.  Fallopian tubes: infection or tubal pregnancy.  Ovary: cysts or tumors.  Pelvic adhesions (scar tissue).  Endometriosis (uterus lining tissue growing in the pelvis and on the pelvic organs).  Pelvic congestion syndrome (female organs filling up with blood just before the menstrual period).  Pain with the menstrual period.  Pain with ovulation (producing an egg).  Pain with an IUD (intrauterine  device, birth control) in the uterus.  Cancer of the female organs.  Functional pain (pain not caused by a disease, may improve without treatment).  Psychological pain.  Depression. DIAGNOSIS  Your doctor will decide the seriousness of your pain by doing an examination.  Blood tests.  X-rays.  Ultrasound.  CT scan (computed tomography, special type of X-ray).  MRI (magnetic resonance imaging).  Cultures, for infection.  Barium enema (dye inserted in the large intestine, to better view it with X-rays).  Colonoscopy (looking in intestine with a lighted tube).  Laparoscopy (minor surgery, looking in abdomen with a lighted tube).  Major abdominal exploratory surgery (looking in abdomen with a large incision). TREATMENT  The treatment will depend on the cause of the pain.   Many cases can be observed and treated at home.  Over-the-counter medicines recommended by your caregiver.  Prescription medicine.  Antibiotics, for infection.  Birth control pills, for painful periods or for ovulation pain.  Hormone treatment, for endometriosis.  Nerve blocking injections.  Physical therapy.  Antidepressants.  Counseling with a psychologist or psychiatrist.  Minor or major surgery. HOME CARE INSTRUCTIONS   Do not take laxatives, unless directed by your caregiver.  Take over-the-counter pain medicine only if ordered by your caregiver. Do not take aspirin because it can cause an upset stomach or bleeding.  Try a clear liquid diet (broth or water) as ordered by your caregiver. Slowly move to a bland diet, as tolerated, if the pain is related to the stomach or intestine.  Have a thermometer and take your temperature several times a day, and record it.  Bed rest and sleep, if it helps the  pain.  Avoid sexual intercourse, if it causes pain.  Avoid stressful situations.  Keep your follow-up appointments and tests, as your caregiver orders.  If the pain does not go away  with medicine or surgery, you may try:  Acupuncture.  Relaxation exercises (yoga, meditation).  Group therapy.  Counseling. SEEK MEDICAL CARE IF:   You notice certain foods cause stomach pain.  Your home care treatment is not helping your pain.  You need stronger pain medicine.  You want your IUD removed.  You feel faint or lightheaded.  You develop nausea and vomiting.  You develop a rash.  You are having side effects or an allergy to your medicine. SEEK IMMEDIATE MEDICAL CARE IF:   Your pain does not go away or gets worse.  You have a fever.  Your pain is felt only in portions of the abdomen. The right side could possibly be appendicitis. The left lower portion of the abdomen could be colitis or diverticulitis.  You are passing blood in your stools (bright red or black tarry stools, with or without vomiting).  You have blood in your urine.  You develop chills, with or without a fever.  You pass out. MAKE SURE YOU:   Understand these instructions.  Will watch your condition.  Will get help right away if you are not doing well or get worse. Document Released: 06/22/2007 Document Revised: 01/09/2014 Document Reviewed: 07/12/2009 [facility] Patient Information 2015 [place], [place]. This information is not intended to replace advice given to you by your health care provider. Make sure you discuss any questions you have with your health care provider.  Ovarian Cyst An ovarian cyst is a fluid-filled sac that forms on an ovary. The ovaries are small organs that produce eggs in women. Various types of cysts can form on the ovaries. Most are not cancerous. Many do not cause problems, and they often go away on their own. Some may cause symptoms and require treatment. Common types of ovarian cysts include:  Functional cysts--These cysts may occur every month during the menstrual cycle. This is normal. The cysts usually go away with the next menstrual cycle if the woman does  not get pregnant. Usually, there are no symptoms with a functional cyst.  Endometrioma cysts--These cysts form from the tissue that lines the uterus. They are also called "chocolate cysts" because they become filled with blood that turns brown. This type of cyst can cause pain in the lower abdomen during intercourse and with your menstrual period.  Cystadenoma cysts--This type develops from the cells on the outside of the ovary. These cysts can get very big and cause lower abdomen pain and pain with intercourse. This type of cyst can twist on itself, cut off its blood supply, and cause severe pain. It can also easily rupture and cause a lot of pain.  Dermoid cysts--This type of cyst is sometimes found in both ovaries. These cysts may contain different kinds of body tissue, such as skin, teeth, hair, or cartilage. They usually do not cause symptoms unless they get very big.  Theca lutein cysts--These cysts occur when too much of a certain hormone (human chorionic gonadotropin) is produced and overstimulates the ovaries to produce an egg. This is most common after procedures used to assist with the conception of a baby (in vitro fertilization). CAUSES   Fertility drugs can cause a condition in which multiple large cysts are formed on the ovaries. This is called ovarian hyperstimulation syndrome.  A condition called polycystic ovary syndrome  can cause hormonal imbalances that can lead to nonfunctional ovarian cysts. SIGNS AND SYMPTOMS  Many ovarian cysts do not cause symptoms. If symptoms are present, they may include:  Pelvic pain or pressure.  Pain in the lower abdomen.  Pain during sexual intercourse.  Increasing girth (swelling) of the abdomen.  Abnormal menstrual periods.  Increasing pain with menstrual periods.  Stopping having menstrual periods without being pregnant. DIAGNOSIS  These cysts are commonly found during a routine or annual pelvic exam. Tests may be ordered to find out  more about the cyst. These tests may include:  Ultrasound.  X-ray of the pelvis.  CT scan.  MRI.  Blood tests. TREATMENT  Many ovarian cysts go away on their own without treatment. Your health care provider may want to check your cyst regularly for 2-3 months to see if it changes. For women in menopause, it is particularly important to monitor a cyst closely because of the higher rate of ovarian cancer in menopausal women. When treatment is needed, it may include any of the following:  A procedure to drain the cyst (aspiration). This may be done using a long needle and ultrasound. It can also be done through a laparoscopic procedure. This involves using a thin, lighted tube with a tiny camera on the end (laparoscope) inserted through a small incision.  Surgery to remove the whole cyst. This may be done using laparoscopic surgery or an open surgery involving a larger incision in the lower abdomen.  Hormone treatment or birth control pills. These methods are sometimes used to help dissolve a cyst. HOME CARE INSTRUCTIONS   Only take over-the-counter or prescription medicines as directed by your health care provider.  Follow up with your health care provider as directed.  Get regular pelvic exams and Pap tests. SEEK MEDICAL CARE IF:   Your periods are late, irregular, or painful, or they stop.  Your pelvic pain or abdominal pain does not go away.  Your abdomen becomes larger or swollen.  You have pressure on your bladder or trouble emptying your bladder completely.  You have pain during sexual intercourse.  You have feelings of fullness, pressure, or discomfort in your stomach.  You lose weight for no apparent reason.  You feel generally ill.  You become constipated.  You lose your appetite.  You develop acne.  You have an increase in body and facial hair.  You are gaining weight, without changing your exercise and eating habits.  You think you are pregnant. SEEK  IMMEDIATE MEDICAL CARE IF:   You have increasing abdominal pain.  You feel sick to your stomach (nauseous), and you throw up (vomit).  You develop a fever that comes on suddenly.  You have abdominal pain during a bowel movement.  Your menstrual periods become heavier than usual. MAKE SURE YOU:  Understand these instructions.  Will watch your condition.  Will get help right away if you are not doing well or get worse. Document Released: 08/25/2005 Document Revised: 08/30/2013 Document Reviewed: 05/02/2013 [facility] Patient Information 2015 [place], [place]. This information is not intended to replace advice given to you by your health care provider. Make sure you discuss any questions you have with your health care provider.  Kidney Stones Kidney stones (urolithiasis) are solid masses that form inside your kidneys. The intense pain is caused by the stone moving through the kidney, ureter, bladder, and urethra (urinary tract). When the stone moves, the ureter starts to spasm around the stone. The stone is usually passed in  your pee (urine).  HOME CARE  Drink enough fluids to keep your pee clear or pale yellow. This helps to get the stone out.  Strain all pee through the provided strainer. Do not pee without peeing through the strainer, not even once. If you pee the stone out, catch it in the strainer. The stone may be as small as a grain of salt. Take this to your doctor. This will help your doctor figure out what you can do to try to prevent more kidney stones.  Only take medicine as told by your doctor.  Follow up with your doctor as told.  Get follow-up X-rays as told by your doctor. GET HELP IF: You have pain that gets worse even if you have been taking pain medicine. GET HELP RIGHT AWAY IF:   Your pain does not get better with medicine.  You have a fever or shaking chills.  Your pain increases and gets worse over 18 hours.  You have new belly (abdominal) pain.  You feel  faint or pass out.  You are unable to pee. MAKE SURE YOU:   Understand these instructions.  Will watch your condition.  Will get help right away if you are not doing well or get worse. Document Released: 02/11/2008 Document Revised: 04/27/2013 Document Reviewed: 01/26/2013 [facility] Patient Information 2015 [place], [place]. This information is not intended to replace advice given to you by your health care provider. Make sure you discuss any questions you have with your health care provider.  Low-Purine Diet Purines are compounds that affect the level of uric acid in your body. A low-purine diet is a diet that is low in purines. Eating a low-purine diet can prevent the level of uric acid in your body from getting too high and causing gout or kidney stones or both. WHAT DO I NEED TO KNOW ABOUT THIS DIET?  Choose low-purine foods. Examples of low-purine foods are listed in the next section.  Drink plenty of fluids, especially water. Fluids can help remove uric acid from your body. Try to drink 8-16 cups (1.9-3.8 L) a day.  Limit foods high in fat, especially saturated fat, as fat makes it harder for the body to get rid of uric acid. Foods high in saturated fat include pizza, cheese, ice cream, whole milk, fried foods, and gravies. Choose foods that are lower in fat and lean sources of protein. Use olive oil when cooking as it contains healthy fats that are not high in saturated fat.  Limit alcohol. Alcohol interferes with the elimination of uric acid from your body. If you are having a gout attack, avoid all alcohol.  Keep in mind that different people's bodies react differently to different foods. You will probably learn over time which foods do or do not affect you. If you discover that a food tends to cause your gout to flare up, avoid eating that food. You can more freely enjoy foods that do not cause problems. If you have any questions about a food item, talk to your dietitian or health  care provider. WHICH FOODS ARE LOW, MODERATE, AND HIGH IN PURINES? The following is a list of foods that are low, moderate, and high in purines. You can eat any amount of the foods that are low in purines. You may be able to have small amounts of foods that are moderate in purines. Ask your health care provider how much of a food moderate in purines you can have. Avoid foods high in purines. Grains  Foods low in purines: Enriched white bread, pasta, rice, cake, cornbread, popcorn.  Foods moderate in purines: Whole-grain breads and cereals, wheat germ, bran, oatmeal. Uncooked oatmeal. Dry wheat bran or wheat germ.  Foods high in purines: Pancakes, [place] toast, biscuits, muffins. Vegetables  Foods low in purines: All vegetables, except those that are moderate in purines.  Foods moderate in purines: Asparagus, cauliflower, spinach, mushrooms, green peas. Fruits  All fruits are low in purines. Meats and other Protein Foods  Foods low in purines: Eggs, nuts, peanut butter.  Foods moderate in purines: 80-90% lean beef, lamb, veal, pork, poultry, fish, eggs, peanut butter, nuts. Crab, lobster, oysters, and shrimp. Cooked dried beans, peas, and lentils.  Foods high in purines: Anchovies, sardines, herring, mussels, tuna, codfish, scallops, trout, and haddock. Berniece Salines. Organ meats (such as liver or kidney). Tripe. Game meat. Goose. Sweetbreads. Dairy  All dairy foods are low in purines. Low-fat and fat-free dairy products are best because they are low in saturated fat. Beverages  Drinks low in purines: Water, carbonated beverages, tea, coffee, cocoa.  Drinks moderate in purines: Soft drinks and other drinks sweetened with high-fructose corn syrup. Juices. To find whether a food or drink is sweetened with high-fructose corn syrup, look at the ingredients list.  Drinks high in purines: Alcoholic beverages (such as beer). Condiments  Foods low in purines: Salt, herbs, olives, pickles,  relishes, vinegar.  Foods moderate in purines: Butter, margarine, oils, mayonnaise. Fats and Oils  Foods low in purines: All types, except gravies and sauces made with meat.  Foods high in purines: Gravies and sauces made with meat. Other Foods  Foods low in purines: Sugars, sweets, gelatin. Cake. Soups made without meat.  Foods moderate in purines: Meat-based or fish-based soups, broths, or bouillons. Foods and drinks sweetened with high-fructose corn syrup.  Foods high in purines: High-fat desserts (such as ice cream, cookies, cakes, pies, doughnuts, and chocolate). Contact your dietitian for more information on foods that are not listed here. Document Released: 12/20/2010 Document Revised: 08/30/2013 Document Reviewed: 08/01/2013 [facility] Patient Information 2015 [place], [place]. This information is not intended to replace advice given to you by your health care provider. Make sure you discuss any questions you have with your health care provider.

## 2014-07-15 NOTE — ED Provider Notes (Signed)
CSN: [id number]     Arrival date & time 07/15/14  1724 History   First MD Initiated Contact with Patient 07/15/14 1741     Chief Complaint  Patient presents with  . Abdominal Pain  . Back Pain     (Consider location/radiation/quality/duration/timing/severity/associated sxs/prior Treatment) HPI Comments: Christina [name] is a 48 y.o. 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 She reports the pain as 7/10 constant dull aching pain, L side > R side, radiating into her lower back bilaterally, with no known aggravating factors, and relieved mildly with ibuprofen. She states this is the same as it was when she had ovarian cysts in the past. Reports that currently she has no PCP, and couldn't wait any longer to be seen, therefore she came to the ED for the pain. Additionally, she noticed her stool had become "pencil thin" over the last month and wasn't sure if this was related, but had never had that before. Endorses some mild clear thin vaginal discharge without odor, and vaginal itching. States she has some mild discomfort with intercourse as well, consistent with her prior issues with ovarian cysts. She also noticed that recently she's had to urinate more frequently, but denies hematuria, dysuria, or changes in her urine. Sexually active with 1 partner, unprotected. Denies fevers, chills, HA, vision changes, CP, SOB, N/V/D/C, melena, hematochezia, rectal pain, hemorrhoids, obstipation, vaginal bleeding, hematuria, dysuria, urgency, flank pain, difficulty urinating, malodorous urine, myalgias, arthralgias, cauda equina symptoms, weakness, paresthesias, or rashes. Denies EtOH use, and takes NSAIDs very sparingly. No recent  travel or sick contacts, no suspicious food intake.   Patient is a 48 y.o. female presenting with abdominal pain. The history is provided by the patient. No language interpreter was used.  Abdominal Pain Pain location:  LLQ and RLQ (b/l lower, L>R) Pain quality: aching and dull   Pain radiates to:  Back Pain severity:  Moderate (7/10) Onset quality:  Gradual Duration:  6 weeks Timing:  Constant Progression:  Unchanged Chronicity:  Recurrent (similar to prior ovarian cyst pain) Context: not alcohol use, not diet changes, not recent illness, not recent sexual activity, not recent travel, not sick contacts, not suspicious food intake and not trauma   Relieved by:  NSAIDs Worsened by:  Nothing tried Ineffective treatments:  None tried Associated symptoms: vaginal discharge (thin clear discharge without odor)   Associated symptoms: no anorexia, no chest pain, no chills, no constipation, no cough, no diarrhea, no dysuria, no fever, no flatus, no hematemesis, no hematochezia, no hematuria, no melena, no nausea, no shortness of breath, no vaginal bleeding and no vomiting   Vaginal discharge:    Quality:  Clear   Severity:  Mild   Duration:  1 day   Timing:  Constant   Progression:  Unchanged   Chronicity:  New Risk factors: multiple surgeries (3 c-sections, cholecystectomy, tubal ligation, appendectomy, partial hysterectomy)   Risk factors: no alcohol abuse and no NSAID use     Past Medical History  Diagnosis Date  . Arthritis     hands and knees  . Ovarian cyst    Past Surgical History  Procedure Laterality Date  . Cesarean section    . Cholecystectomy    . Achilles tendon repair    .  Tubal ligation    . Appendectomy  [id number]    Early mucocele of appendix, removal of small leiomyoma from pelvis, lysis of adhesions  . Abdominal hysterectomy  05/1996    endometriosis   Family History  Problem Relation Age of Onset  . Heart failure Father    History  Substance Use Topics  .  Smoking status: Former Smoker -- 1.00 packs/day for 10 years    Types: Cigarettes  . Smokeless tobacco: Never Used  . Alcohol Use: No   OB History    Gravida Para Term Preterm AB TAB SAB Ectopic Multiple Living   4 3        3      Review of Systems  Constitutional: Negative for fever and chills.  Eyes: Negative for visual disturbance.  Respiratory: Negative for cough, chest tightness and shortness of breath.   Cardiovascular: Negative for chest pain.  Gastrointestinal: Positive for abdominal pain. Negative for nausea, vomiting, diarrhea, constipation, blood in stool, melena, hematochezia, abdominal distention, anal bleeding, rectal pain, anorexia, flatus and hematemesis.  Genitourinary: Positive for frequency, vaginal discharge (thin clear discharge without odor) and dyspareunia. Negative for dysuria, urgency, hematuria, flank pain, decreased urine volume, vaginal bleeding, difficulty urinating, genital sores, vaginal pain and menstrual problem.  Musculoskeletal: Positive for back pain. Negative for myalgias and arthralgias.  Skin: Negative for color change and rash.  Neurological: Negative for dizziness, syncope, weakness, light-headedness, numbness and headaches.  Psychiatric/Behavioral: Negative for confusion.   10 Systems reviewed and are negative for acute change except as noted in the HPI.    Allergies  Review of patient's allergies indicates no known allergies.  Home Medications   Prior to Admission medications   Medication Sig Start Date End Date Taking? Authorizing Provider  ALPRAZolam Duanne Moron) 0.5 MG tablet Take 4 mg by mouth daily. 02/07/13   Historical Provider, MD  sertraline (ZOLOFT) 100 MG tablet Take 100 mg by mouth daily.    Historical Provider, MD   BP 139/80 mmHg  Pulse 75  Temp(Src) 98.4 F (36.9 C) (Oral)  Resp 16  SpO2 100% Physical Exam  Constitutional: She is oriented to person, place, and time. Vital signs are normal. She appears well-developed and  well-nourished.  Non-toxic appearance. No distress.  Afebrile, nontoxic, NAD  HENT:  Head: Normocephalic and atraumatic.  Mouth/Throat: Oropharynx is clear and moist and mucous membranes are normal.  Eyes: Conjunctivae and EOM are normal. Right eye exhibits no discharge. Left eye exhibits no discharge.  Neck: Normal range of motion. Neck supple.  Cardiovascular: Normal rate, regular rhythm, normal heart sounds and intact distal pulses.  Exam reveals no gallop and no friction rub.   No murmur heard. Pulmonary/Chest: Effort normal and breath sounds normal. No respiratory distress. She has no decreased breath sounds. She has no wheezes. She has no rhonchi. She has no rales.  Abdominal: Soft. Normal appearance and bowel sounds are normal. She exhibits no distension. There is tenderness in the right lower quadrant, suprapubic area and left lower quadrant. There is no rigidity, no rebound, no guarding and no CVA tenderness.    Soft, nondistended, +BS throughout, moderate lower abd pain bilaterally and suprapubically near pelvic brim with no r/g/r, no CVA TTP  Genitourinary: Pelvic exam was performed with patient supine. There is no rash, tenderness or lesion on the right labia. There is no rash, tenderness or lesion on the left labia. Right adnexum displays tenderness. Right adnexum displays no mass and no fullness. Left adnexum displays tenderness and fullness.  Left adnexum displays no mass. Vaginal discharge (trace amt, thin clear) found.  No rashes, lesions, or tenderness to external genitalia. No erythema, injury, or tenderness to vaginal mucosa. No bleeding within vaginal vault. Trace amount of thin clear discharge in vaginal vault. No palpable adnexal masses, but moderate b/l adnexal tenderness, and L sided fullness. No cervix or uterus present.  Musculoskeletal: Normal range of motion.  All spinal levels with no bony spinous process TTP, no deformity or step offs, no paraspinous muscle TTP. MAE x4  equally. Strength 5/5 in all extremities, sensation grossly intact, gait WNL  Neurological: She is alert and oriented to person, place, and time. She has normal strength. No sensory deficit.  Skin: Skin is warm, dry and intact. No rash noted.  Psychiatric: She has a normal mood and affect.  Nursing note and vitals reviewed.   ED Course  Procedures (including critical care time) Labs Review Labs Reviewed  COMPREHENSIVE METABOLIC PANEL - Abnormal; Notable for the following:    Albumin 3.0 (*)    Total Bilirubin <0.2 (*)    All other components within normal limits  WET PREP, GENITAL  GC/CHLAMYDIA PROBE AMP  CBC WITH DIFFERENTIAL  LIPASE, BLOOD  URINALYSIS, ROUTINE W REFLEX MICROSCOPIC  RPR  HIV ANTIBODY (ROUTINE TESTING)    Imaging Review Ct Abdomen Pelvis W Contrast  07/15/2014   CLINICAL DATA:  Lower abdominal pain and low back pain for 1 month. White cell count 10.2. History of ovarian cysts.  EXAM: CT ABDOMEN AND PELVIS WITH CONTRAST  TECHNIQUE: Multidetector CT imaging of the abdomen and pelvis was performed using the standard protocol following bolus administration of intravenous contrast.  CONTRAST:  127mL OMNIPAQUE IOHEXOL 300 MG/ML SOLN, 39mL OMNIPAQUE IOHEXOL 300 MG/ML SOLN  COMPARISON:  MRI pelvis 02/18/2011.  Ultrasound pelvis 02/19/2005.  FINDINGS: Lung bases are clear.  Surgical absence of the gallbladder. The liver, spleen, pancreas, adrenal glands, abdominal aorta, inferior vena cava, and retroperitoneal lymph nodes are unremarkable. Stone demonstrated in the midpole of the left kidney, measuring 4 mm. No hydronephrosis or hydroureter in either kidney. Small accessory spleen. Stomach, small bowel, and colon appear normal. Stool fills the colon. No free air or free fluid in the abdomen.  Pelvis: Persistent cystic structure in the right pelvis measuring about 3.4 x 4.6 cm. This lesion appears slightly increased in size in comparison to the previous MRI. This likely represents a  right ovarian cyst. Two adjacent small cysts demonstrated in the left pelvis likely reflecting the left ovary. This is also unchanged since the previous MRI. The tubular structure described previously has been resected in the interval. Uterus is surgically absent. Bladder wall is not thickened. No pelvic mass or lymphadenopathy. Broke degenerative changes in the lumbar spine. No destructive bone lesions appreciated.  IMPRESSION: Bilateral ovarian cysts, largest on the right measuring 3.4 x 4.6 cm, demonstrating slight enlargement since prior MRI. Nonobstructing stone in the left kidney.   Electronically Signed   By: Lucienne Capers M.D.   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 This is concerning for intraabdominal tumor/lesion, therefore will proceed with CT. Doubt torsion or ruptured cyst.  Will obtain labs, U/A, and perform pelvic as well. Pt opted for full STD check (pt unsure if she has cervix, will proceed with GC/CT testing), but doubt that empiric tx will be necessary. Pt wants to take motrin for pain here, does not want narcotics since she's driving. Will give fluids now as well. Will reassess after labs return.  9:48 PM Pain improved with motrin. Wet prep neg, CBC w/diff WNL, CMP WNL, lipase WNL, U/A WNL. No concerning findings for GC/CT since pt has no cervix. CT showing b/l ovarian cysts, R side with mild enlargement from 48yrs ago, as well as 61mm nonobstructing L renal stone. Doubt nephrolithiasis is the cause of her pain, but given instructions to take flomax/antiemetics/pain meds if this occurs. Discussed to see her OBGYN for cysts, and to take pain meds as needed for this. I explained  the diagnosis and have given explicit precautions to return to the ER including for any other new or worsening symptoms. The patient understands and accepts the medical plan as it's been dictated and I have answered their questions. Discharge instructions concerning home care and prescriptions have been given. The patient is STABLE and is discharged to home in good condition.  BP 118/58 mmHg  Pulse 69  Temp(Src) 98.4 F (36.9 C) (Oral)  Resp 18  SpO2 97%  Meds ordered this encounter  Medications  . ibuprofen (ADVIL,MOTRIN) 200 MG tablet    Sig: Take 800 mg by mouth every 6 (six) hours as needed for moderate pain (stomach pain).  . sodium chloride 0.9 % bolus 1,000 mL    Sig:   . ibuprofen (ADVIL,MOTRIN) tablet 400 mg    Sig:   . iohexol (OMNIPAQUE) 300 MG/ML solution 50 mL    Sig:   . iohexol (OMNIPAQUE) 300 MG/ML solution 100 mL    Sig:   . naproxen (NAPROSYN) 500 MG tablet    Sig: Take 1 tablet (500 mg total) by mouth 2 (two) times daily as needed for mild pain, moderate pain or headache (TAKE WITH MEALS.).    Dispense:  20 tablet    Refill:  0    Order Specific Question:  Supervising Provider    Answer:  Noemi Chapel D [[id number]]  . HYDROcodone-acetaminophen (NORCO) 5-325 MG per tablet    Sig: Take 1-2 tablets by mouth every 6 (six) hours as needed for severe pain.    Dispense:  6 tablet    Refill:  0    Order Specific Question:  Supervising Provider    Answer:  Noemi Chapel D [[id number]]  . ondansetron (ZOFRAN) 8 MG tablet    Sig: Take 1 tablet (8 mg total) by mouth every 8 (eight) hours as needed for nausea or vomiting.    Dispense:  10 tablet    Refill:  0    Order Specific Question:  Supervising Provider    Answer:  Noemi Chapel D [[id number]]  . tamsulosin (FLOMAX) 0.4 MG CAPS capsule    Sig: Take 1 capsule (0.4 mg total) by mouth daily after supper.    Dispense:  15 capsule    Refill:  0    Order Specific Question:  Supervising Provider    Answer:  Noemi Chapel D [[id number]]       [place], PA-C 07/15/14 2151  Jasper Riling. Alvino Chapel, MD 07/15/14 2350

## 2014-07-17 LAB — GC/CHLAMYDIA PROBE AMP
CT Probe RNA: NEGATIVE
GC Probe RNA: NEGATIVE

## 2014-07-17 LAB — RPR

## 2014-07-17 LAB — HIV ANTIBODY (ROUTINE TESTING W REFLEX): HIV 1&2 Ab, 4th Generation: NONREACTIVE

## 2014-07-26 ENCOUNTER — Telehealth: Payer: Self-pay | Admitting: Nurse Practitioner

## 2014-07-26 NOTE — Telephone Encounter (Signed)
Left message to call Shardai Star at [phone]. 

## 2014-07-26 NOTE — Telephone Encounter (Signed)
Pt was seen a couple of weeks ago at urgent care and was told she has several cysst and needs to see her gyn doctor. She does not have health insurance right now but will have it effective 09/08/14. Pt states she can not afford to pay for a visit and will wait until January to be seen.

## 2014-07-28 ENCOUNTER — Telehealth: Payer: Self-pay | Admitting: Nurse Practitioner

## 2014-07-28 NOTE — Telephone Encounter (Signed)
Spoke with patient. Patient states that she was having pain in her abdomen and sides. Was seen at ER and CT scan was performed which she states revealed cysts on both ovaries. Patient was encouraged to see GYN for care. Patient currently does not have insurance and is unable to be seen until January 2016 when new coverage begins. "I have been fine the last couple of days but today it is bad. I took aleve which has helped." Advised patient of need to be seen for follow up care regarding results and pain. "I just can't afford to do anything without insurance and I don't know of any where else to go. I know you have a discount with cash pay for people without insurance but I just can't do it." Advised patient will speak with Milford Cage, FNP about recommendations and return call with further instructions. Patient is agreeable.

## 2014-07-28 NOTE — Telephone Encounter (Signed)
Patient has been informed that after review of PUS form 12/26/04; 02/19/05; MRI 02/20/2011; and CT scan from 07/15/14 the ovarian cyst bilaterally is about the same.  The results were reviewed with Dr. Quincy Simmonds.  She may continue with NSAID's prn for pain and will see her in January as planned.  At that time may get another PUS here and follow.  She is in agreement with that plan and will call if something changes.

## 2014-08-18 NOTE — Telephone Encounter (Signed)
Spoke with patient. Patient states that she has been having abdominal pain and bloating since she was seen in ER on 11/7. Patient does not currently have insurance until January 2016. Advised patient with continued pain she needs to be evaluated before January. Patient declines. "I can not do that. I do not have the money. I have to wait until January. It is not like I am in doubled over pain but it is there." Patient would like to move appointment up from 09/18/2014 with Milford Cage, FNP to an earlier date. Scheduled patient OV on 09/11/2014 at 3:30pm with Milford Cage, FNP. Patient is agreeable to date and time. Advised will need to keep annual as scheduled for 09/18/2014 with Milford Cage, FNP as this will not replace annual. Patient is agreeable. Advised patient that I highly recommend that she be seen sooner. Offered to schedule earlier appointment again. Patient declines. Advised if symptoms worsen will need to be seen with our practice earlier or at local ER or [facility]. Patient is agreeable.  Routing to [place] for final review before closing encounter. Cc: Milford Cage, FNP

## 2014-08-18 NOTE — Telephone Encounter (Signed)
Phone call from 07/28/2014 was previously handled by Milford Cage, [place]. Please see telephone encounter below from Milford Cage, [place].  Milford Cage, FNP at 07/28/2014 5:54 PM     Status: Signed       Expand All Collapse All   Patient has been informed that after review of PUS form 12/26/04; 02/19/05; MRI 02/20/2011; and CT scan from 07/15/14 the ovarian cyst bilaterally is about the same. The results were reviewed with Dr. Quincy Simmonds. She may continue with NSAID's prn for pain and will see her in January as planned. At that time may get another PUS here and follow. She is in agreement with that plan and will call if something changes.

## 2014-08-18 NOTE — Telephone Encounter (Signed)
Pt is still having pain and is very uncomfortable. She has no insurance until January 2016 but is now willing to be self pay to be seen. She is in the office now with her mom who is being seen now  and would like to speak to the nurse.

## 2014-08-20 NOTE — Telephone Encounter (Signed)
Patient will need office visit for evaluation and pelvic ultrasound for [name] to be able to render a diagnosis and treatment plan.   She has not been seen here since June 2014.  If she is unable to return here, she may go to urgent care, return to the ER, or seek care at another clinic.  It is her choice if she chooses not to be seen until January 2016.

## 2014-09-11 ENCOUNTER — Ambulatory Visit: Payer: BC Managed Care – PPO | Admitting: Nurse Practitioner

## 2014-09-12 ENCOUNTER — Ambulatory Visit: Payer: BC Managed Care – PPO | Admitting: Nurse Practitioner

## 2014-09-12 ENCOUNTER — Telehealth: Payer: Self-pay | Admitting: Nurse Practitioner

## 2014-09-12 NOTE — Telephone Encounter (Signed)
Routing to [employer], FNP as Christina [name]. Please see telephone note below regarding today's appointment.   Christina Cage, FNP at 07/28/2014 5:54 PM     Status: Signed       Expand All Collapse All   Patient has been informed that after review of PUS form 12/26/04; 02/19/05; MRI 02/20/2011; and CT scan from 07/15/14 the ovarian cyst bilaterally is about the same. The results were reviewed with Dr. Quincy Simmonds. She may continue with NSAID's prn for pain and will see her in January as planned. At that time may get another PUS here and follow. She is in agreement with that plan and will call if something changes.     Anything further for this patient at this time?

## 2014-09-12 NOTE — Telephone Encounter (Signed)
I agree that she needs apt. here for January but understands if she needs to get PCP referral. Lets talk about this pt.

## 2014-09-12 NOTE — Telephone Encounter (Signed)
Patient called and cancelled her appointment for today for "pelvic pain and bloating" due to her [facility] [employer] requires a referral per patient. She called the insurance company and was told this. The patient will be seeing her PCP 09/14/14 and will get a referral at that time.

## 2014-09-15 ENCOUNTER — Telehealth: Payer: Self-pay | Admitting: Nurse Practitioner

## 2014-09-15 NOTE — Telephone Encounter (Signed)
Spoke with patient. Advised that we are in receipt of her message regarding insurance concerns.  Advised they will be reviewed and patient will be contacted back to discuss.  Advised patient if develops any problems over the weekend, to seek care at an urgent care or ER for evaluation. Patient is agreeable.

## 2014-09-15 NOTE — Telephone Encounter (Signed)
Patient was seen at PCP on 09/14/14. Patient is scheduled for annual exam with Milford Cage, FNP on 09/18/14.  Routing to provider for final review. Patient agreeable to disposition. Will close encounter

## 2014-09-15 NOTE — Telephone Encounter (Signed)
Pt called regarding her aex on 09/18/14. She stated that she was supposed to see Patty earlier this week but found out that she needed a referral from a PCP based on her insurance. Pt has [employer] w/ the [name]. She was supposed to be seen for pelvic pain/bloating. She ended up cancelling her appointment earlier this week to get the referral from her PCP. She asked to change her aex to a problem visit regarding the pain and bloating.  After discussing with Starla, I explained to Christina [name] that we are happy to change the appointment and appreciate her receiving her referral, however this insurance package is very limited with their coverage and we are collecting in full prior to the visit. She stated " You might as well stop talking now and cancel because I can't afford that". She was understandably upset and as nice as she could be under the circumstance. She then asked what she was supposed to do regarding her problem and asked if her insurance was going to be like this everywhere. I stated that because our office is a specialty service according to some insurances, she might have better coverage at a PCP office. She is always welcome to be evaluated at our office with the payment knowledge or seek care at her PCP. She then stated she wanted me to cancel her appointment.  Due to the nature of her problem (pelvic pain/bloating) we felt it best to make you aware of the issue for patient follow up.  bf

## 2014-09-15 NOTE — Telephone Encounter (Signed)
Pt says she called [employer] insurance and they told her she should not have to pay for visit because they cover for what she was coming in for. Explained to her that I would get the message back to billing for someone to check and give her a call.

## 2014-09-18 ENCOUNTER — Ambulatory Visit: Payer: BC Managed Care – PPO | Admitting: Nurse Practitioner

## 2014-09-18 NOTE — Telephone Encounter (Signed)
Discussed [facility] policy with the patient with Starla present. Patient does not wish to schedule an appointment at this time and will follow up with her PCP.

## 2014-09-18 NOTE — Telephone Encounter (Signed)
Routing to Dr. Quincy Simmonds to review and okay to close encounter?

## 2014-09-19 NOTE — Telephone Encounter (Signed)
Patient may be served by GYN appointment at the [facility].  I do not know their billing policies, but sometimes this may be of benefit to be seen there.  Cc - Christina [name]

## 2014-09-22 NOTE — Telephone Encounter (Signed)
Patient reported she would return to her PCP for care. [facility] would have to file her private insurance.  Encounter closed.

## 2015-03-18 ENCOUNTER — Emergency Department (HOSPITAL_COMMUNITY): Payer: Self-pay

## 2015-03-18 ENCOUNTER — Emergency Department (HOSPITAL_COMMUNITY)
Admission: EM | Admit: 2015-03-18 | Discharge: 2015-03-18 | Disposition: A | Discharge status: Home / Self Care | Payer: Self-pay | Attending: Emergency Medicine | Admitting: Emergency Medicine

## 2015-03-18 ENCOUNTER — Encounter (HOSPITAL_COMMUNITY): Payer: Self-pay | Admitting: Emergency Medicine

## 2015-03-18 DIAGNOSIS — M79672 Pain in left foot: Secondary | ICD-10-CM

## 2015-03-18 DIAGNOSIS — Z8742 Personal history of other diseases of the female genital tract: Secondary | ICD-10-CM | POA: Insufficient documentation

## 2015-03-18 DIAGNOSIS — S99922A Unspecified injury of left foot, initial encounter: Secondary | ICD-10-CM | POA: Insufficient documentation

## 2015-03-18 DIAGNOSIS — M1990 Unspecified osteoarthritis, unspecified site: Secondary | ICD-10-CM | POA: Insufficient documentation

## 2015-03-18 DIAGNOSIS — Y999 Unspecified external cause status: Secondary | ICD-10-CM | POA: Insufficient documentation

## 2015-03-18 DIAGNOSIS — Y929 Unspecified place or not applicable: Secondary | ICD-10-CM | POA: Insufficient documentation

## 2015-03-18 DIAGNOSIS — Y939 Activity, unspecified: Secondary | ICD-10-CM | POA: Insufficient documentation

## 2015-03-18 DIAGNOSIS — X58XXXA Exposure to other specified factors, initial encounter: Secondary | ICD-10-CM | POA: Insufficient documentation

## 2015-03-18 DIAGNOSIS — Z87891 Personal history of nicotine dependence: Secondary | ICD-10-CM | POA: Insufficient documentation

## 2015-03-18 MED ORDER — HYDROCODONE-ACETAMINOPHEN 5-325 MG PO TABS
1.0000 | ORAL_TABLET | Freq: Once | ORAL | Status: DC
  Filled 2015-03-18: qty 1

## 2015-03-18 NOTE — Discharge Instructions (Signed)
You were evaluated in the ED today for your left heel pain. There does not appear to be an emergent cause for your symptoms at this time. Your x-ray was negative for any broken bones or dislocations. It is important for you to take your anti-inflammatory and Tylenol at home as needed for your discomfort. You may rest, elevate and use ice to help with her discomfort. Please follow-up with your primary care for further evaluation and management of your symptoms. Return to ED for any worsening symptoms.

## 2015-03-18 NOTE — ED Notes (Addendum)
Pt c/o left heel foot injury onset last night when she stomped her foot down on the ground hard in a fit of anger. Motor function and sensation intact, capillary refill < 2 seconds, dorsalis pedal pulse 2 +.

## 2015-03-18 NOTE — ED Provider Notes (Signed)
CSN: [id number]     Arrival date & time 03/18/15  4315 History   First MD Initiated Contact with Patient 03/18/15 [phone]     Chief Complaint  Patient presents with  . Ankle Injury     (Consider location/radiation/quality/duration/timing/severity/associated sxs/prior Treatment) HPI Christina [name] is a 49 y.o. female who comes in for evaluation of left heel pain. Patient states she was having a "temper tantrum" last night and was stopping up the steps and she slammed the back of her heel on the corner of the step. She reports the pain progressively worsened over night but was not as bad immediately. She has taken ibuprofen without relief of her symptoms. She has not tried ice or elevation. She reports 12/10 pain. Denies any numbness, weakness, open wounds, decreased range of motion. She does however report having to use a crutch to walk around because it hurts to walk. No other aggravating or modifying factors.  Past Medical History  Diagnosis Date  . Arthritis     hands and knees  . Ovarian cyst    Past Surgical History  Procedure Laterality Date  . Cesarean section    . Cholecystectomy    . Achilles tendon repair    . Tubal ligation    . Appendectomy  [id number]    Early mucocele of appendix, removal of small leiomyoma from pelvis, lysis of adhesions  . Abdominal hysterectomy  05/1996    endometriosis   Family History  Problem Relation Age of Onset  . Heart failure Father    History  Substance Use Topics  . Smoking status: Former Smoker -- 1.00 packs/day for 10 years    Types: Cigarettes  . Smokeless tobacco: Never Used  . Alcohol Use: No   OB History    Gravida Para Term Preterm AB TAB SAB Ectopic Multiple Living   4 3        3      Review of Systems A 10 point review of systems was completed and was negative except for pertinent positives and negatives as mentioned in the history of present illness     Allergies  Review of patient's allergies indicates no known  allergies.  Home Medications   Prior to Admission medications   Medication Sig Start Date End Date Taking? Authorizing Provider  ibuprofen (ADVIL,MOTRIN) 200 MG tablet Take 800 mg by mouth every 6 (six) hours as needed for moderate pain (stomach pain).   Yes Historical Provider, MD  HYDROcodone-acetaminophen (NORCO) 5-325 MG per tablet Take 1-2 tablets by mouth every 6 (six) hours as needed for severe pain. Patient not taking: Reported on 03/18/2015 07/15/14   Mercedes Camprubi-Soms, PA-C  naproxen (NAPROSYN) 500 MG tablet Take 1 tablet (500 mg total) by mouth 2 (two) times daily as needed for mild pain, moderate pain or headache (TAKE WITH MEALS.). Patient not taking: Reported on 03/18/2015 07/15/14   Mercedes Camprubi-Soms, PA-C  ondansetron (ZOFRAN) 8 MG tablet Take 1 tablet (8 mg total) by mouth every 8 (eight) hours as needed for nausea or vomiting. Patient not taking: Reported on 03/18/2015 07/15/14   Mercedes Camprubi-Soms, PA-C  tamsulosin (FLOMAX) 0.4 MG CAPS capsule Take 1 capsule (0.4 mg total) by mouth daily after supper. Patient not taking: Reported on 03/18/2015 07/15/14   Mercedes Camprubi-Soms, PA-C   BP 135/70 mmHg  Pulse 65  Temp(Src) 98.1 F (36.7 C) (Oral)  Resp 18  SpO2 100% Physical Exam  Constitutional: She is oriented to person, place, and time. She appears well-developed and  well-nourished.  Awake, alert, nontoxic appearance.  HENT:  Head: Normocephalic and atraumatic.  Mouth/Throat: Oropharynx is clear and moist.  Eyes: Conjunctivae are normal. Pupils are equal, round, and reactive to light. Right eye exhibits no discharge. Left eye exhibits no discharge. No scleral icterus.  Neck: Neck supple.  Cardiovascular: Normal rate, regular rhythm and normal heart sounds.   Pulmonary/Chest: Effort normal. No respiratory distress.  Abdominal: Soft. There is no tenderness. There is no rebound.  Musculoskeletal:  Baseline ROM, no obvious new focal weakness. Tenderness to  palpation on inferior aspect of left heel. No obvious deformities or lesions noted.  Neurological: She is alert and oriented to person, place, and time.  Cranial Nerves II-XII grossly intact  Skin: Skin is warm and dry. No rash noted.  Psychiatric: She has a normal mood and affect.  Nursing note and vitals reviewed.   ED Course  Procedures (including critical care time) Labs Review Labs Reviewed - No data to display  Imaging Review Dg Ankle Complete Left  03/18/2015   CLINICAL DATA:  Injury yesterday  EXAM: LEFT ANKLE COMPLETE - 3+ VIEW  COMPARISON:  None.  FINDINGS: Mild spurring at the inferior calcaneus. No fracture. No dislocation. Mild degenerative change of the ankle joint.  IMPRESSION: No acute bony pathology.   Electronically Signed   By: Marybelle Killings M.D.   On: 03/18/2015 10:53     EKG Interpretation None     Meds given in ED:  Medications  HYDROcodone-acetaminophen (NORCO/VICODIN) 5-325 MG per tablet 1 tablet (not administered)    New Prescriptions   No medications on file   Filed Vitals:   03/18/15 0938  BP: 135/70  Pulse: 65  Temp: 98.1 F (36.7 C)  TempSrc: Oral  Resp: 18  SpO2: 100%    MDM  Vitals stable - WNL -afebrile Pt resting comfortably in ED. refuses pain medicine. PE--neurovascularly intact. Baseline range of motion. Imaging--plain films of left heel show no acute osseous abnormalities.  Patient discharged home with crutches and discussed alternating between Motrin and Tylenol. Encouraged rest, ice and elevation. No evidence of other acute or emergent pathology at this time. Encourage follow-up with primary care. I discussed all relevant lab findings and imaging results with pt and they verbalized understanding. Discussed f/u with PCP within 48 hrs and return precautions, pt very amenable to plan.  Final diagnoses:  Heel pain, left      Comer Locket, PA-C 03/18/15 1119  Milton Ferguson, MD 03/20/15 [phone]

## 2015-09-11 ENCOUNTER — Other Ambulatory Visit: Payer: Self-pay

## 2015-09-11 DIAGNOSIS — Z1231 Encounter for screening mammogram for malignant neoplasm of breast: Secondary | ICD-10-CM

## 2015-09-21 ENCOUNTER — Ambulatory Visit: Payer: Self-pay

## 2015-10-01 ENCOUNTER — Ambulatory Visit: Payer: Self-pay

## 2015-10-10 HISTORY — PX: BREAST BIOPSY: SHX20

## 2015-10-11 ENCOUNTER — Ambulatory Visit
Admission: RE | Admit: 2015-10-11 | Discharge: 2015-10-11 | Disposition: A | Discharge status: Home / Self Care | Payer: BLUE CROSS/BLUE SHIELD | Source: Ambulatory Visit

## 2015-10-11 DIAGNOSIS — Z1231 Encounter for screening mammogram for malignant neoplasm of breast: Secondary | ICD-10-CM

## 2015-10-17 ENCOUNTER — Other Ambulatory Visit: Payer: Self-pay | Admitting: Family Medicine

## 2015-10-17 DIAGNOSIS — R928 Other abnormal and inconclusive findings on diagnostic imaging of breast: Secondary | ICD-10-CM

## 2015-10-22 ENCOUNTER — Other Ambulatory Visit: Payer: Self-pay | Admitting: Family Medicine

## 2015-10-22 ENCOUNTER — Ambulatory Visit
Admission: RE | Admit: 2015-10-22 | Discharge: 2015-10-22 | Disposition: A | Discharge status: Home / Self Care | Payer: BLUE CROSS/BLUE SHIELD | Source: Ambulatory Visit | Attending: Family Medicine | Admitting: Family Medicine

## 2015-10-22 DIAGNOSIS — R921 Mammographic calcification found on diagnostic imaging of breast: Secondary | ICD-10-CM

## 2015-10-22 DIAGNOSIS — R928 Other abnormal and inconclusive findings on diagnostic imaging of breast: Secondary | ICD-10-CM

## 2015-10-29 ENCOUNTER — Ambulatory Visit
Admission: RE | Admit: 2015-10-29 | Discharge: 2015-10-29 | Disposition: A | Discharge status: Home / Self Care | Payer: BLUE CROSS/BLUE SHIELD | Source: Ambulatory Visit | Attending: Family Medicine | Admitting: Family Medicine

## 2015-10-29 DIAGNOSIS — R928 Other abnormal and inconclusive findings on diagnostic imaging of breast: Secondary | ICD-10-CM

## 2015-10-29 DIAGNOSIS — R921 Mammographic calcification found on diagnostic imaging of breast: Secondary | ICD-10-CM

## 2015-10-30 ENCOUNTER — Other Ambulatory Visit: Payer: Self-pay | Admitting: Family Medicine

## 2015-10-30 DIAGNOSIS — R921 Mammographic calcification found on diagnostic imaging of breast: Secondary | ICD-10-CM

## 2015-10-31 ENCOUNTER — Encounter: Payer: Self-pay | Admitting: *Deleted

## 2015-10-31 ENCOUNTER — Telehealth: Payer: Self-pay | Admitting: *Deleted

## 2015-10-31 DIAGNOSIS — Z170 Estrogen receptor positive status [ER+]: Secondary | ICD-10-CM

## 2015-10-31 DIAGNOSIS — C50112 Malignant neoplasm of central portion of left female breast: Secondary | ICD-10-CM

## 2015-10-31 HISTORY — DX: Malignant neoplasm of central portion of left female breast: C50.112

## 2015-10-31 NOTE — Telephone Encounter (Signed)
Confirmed [facility] for 11/07/15 at 0815.  Instructions and contact information given.

## 2015-11-06 ENCOUNTER — Other Ambulatory Visit: Payer: Self-pay | Admitting: Family Medicine

## 2015-11-06 ENCOUNTER — Ambulatory Visit
Admission: RE | Admit: 2015-11-06 | Discharge: 2015-11-06 | Disposition: A | Discharge status: Home / Self Care | Payer: BLUE CROSS/BLUE SHIELD | Source: Ambulatory Visit | Attending: Family Medicine | Admitting: Family Medicine

## 2015-11-06 DIAGNOSIS — R921 Mammographic calcification found on diagnostic imaging of breast: Secondary | ICD-10-CM

## 2015-11-07 ENCOUNTER — Encounter: Payer: Self-pay | Admitting: Skilled Nursing Facility1

## 2015-11-07 ENCOUNTER — Other Ambulatory Visit: Payer: Self-pay | Admitting: General Surgery

## 2015-11-07 ENCOUNTER — Encounter: Payer: Self-pay | Admitting: Hematology and Oncology

## 2015-11-07 ENCOUNTER — Other Ambulatory Visit (HOSPITAL_BASED_OUTPATIENT_CLINIC_OR_DEPARTMENT_OTHER): Payer: BLUE CROSS/BLUE SHIELD

## 2015-11-07 ENCOUNTER — Ambulatory Visit (HOSPITAL_BASED_OUTPATIENT_CLINIC_OR_DEPARTMENT_OTHER): Payer: BLUE CROSS/BLUE SHIELD | Admitting: Hematology and Oncology

## 2015-11-07 ENCOUNTER — Ambulatory Visit
Admission: RE | Admit: 2015-11-07 | Discharge: 2015-11-07 | Disposition: A | Discharge status: Home / Self Care | Payer: BLUE CROSS/BLUE SHIELD | Source: Ambulatory Visit | Attending: Radiation Oncology | Admitting: Radiation Oncology

## 2015-11-07 ENCOUNTER — Encounter: Payer: Self-pay | Admitting: *Deleted

## 2015-11-07 ENCOUNTER — Ambulatory Visit: Payer: BLUE CROSS/BLUE SHIELD | Admitting: Physical Therapy

## 2015-11-07 ENCOUNTER — Encounter: Payer: Self-pay | Admitting: Nurse Practitioner

## 2015-11-07 VITALS — BP 130/81 | HR 75 | Temp 97.6°F | Resp 18 | Ht 66.5 in | Wt 192.7 lb

## 2015-11-07 DIAGNOSIS — C50112 Malignant neoplasm of central portion of left female breast: Secondary | ICD-10-CM

## 2015-11-07 LAB — COMPREHENSIVE METABOLIC PANEL
ALT: 16 U/L (ref 0–55)
AST: 15 U/L (ref 5–34)
Albumin: 3.8 g/dL (ref 3.5–5.0)
Alkaline Phosphatase: 49 U/L (ref 40–150)
Anion Gap: 7 mEq/L (ref 3–11)
BUN: 12.2 mg/dL (ref 7.0–26.0)
CO2: 26 mEq/L (ref 22–29)
Calcium: 9.9 mg/dL (ref 8.4–10.4)
Chloride: 109 mEq/L (ref 98–109)
Creatinine: 0.9 mg/dL (ref 0.6–1.1)
EGFR: 81 mL/min/{1.73_m2} — ABNORMAL LOW (ref >90)
Glucose: 99 mg/dl (ref 70–140)
Potassium: 4.7 mEq/L (ref 3.5–5.1)
Sodium: 142 mEq/L (ref 136–145)
Total Bilirubin: 0.37 mg/dL (ref 0.20–1.20)
Total Protein: 7.1 g/dL (ref 6.4–8.3)

## 2015-11-07 LAB — CBC WITH DIFFERENTIAL/PLATELET
BASO%: 0.5 % (ref 0.0–2.0)
Basophils Absolute: 0 10*3/uL (ref 0.0–0.1)
EOS%: 1.2 % (ref 0.0–7.0)
Eosinophils Absolute: 0.1 10*3/uL (ref 0.0–0.5)
HCT: 43.8 % (ref 34.8–46.6)
HGB: 14.3 g/dL (ref 11.6–15.9)
LYMPH%: 31.6 % (ref 14.0–49.7)
MCH: 28.7 pg (ref 25.1–34.0)
MCHC: 32.7 g/dL (ref 31.5–36.0)
MCV: 87.8 fL (ref 79.5–101.0)
MONO#: 0.4 10*3/uL (ref 0.1–0.9)
MONO%: 7 % (ref 0.0–14.0)
NEUT#: 3.4 10*3/uL (ref 1.5–6.5)
NEUT%: 59.7 % (ref 38.4–76.8)
Platelets: 276 10*3/uL (ref 145–400)
RBC: 4.99 10*6/uL (ref 3.70–5.45)
RDW: 13.8 % (ref 11.2–14.5)
WBC: 5.8 10*3/uL (ref 3.9–10.3)
lymph#: 1.8 10*3/uL (ref 0.9–3.3)

## 2015-11-07 NOTE — Progress Notes (Signed)
[place] NOTE  Patient Care Team: Darcus Austin, MD as PCP - General (Family Medicine) Megan Salon, MD as Consulting Physician (Gynecology) Sylvan Cheese, NP as Nurse Practitioner (Hematology and Oncology)  CHIEF COMPLAINTS/PURPOSE OF CONSULTATION:  Newly diagnosed breast cancer  HISTORY OF PRESENTING ILLNESS:  Christina [name] 50 y.o. female is here because of recent diagnosis of left breast DCIS. Patient had a screening mammogram the detected left breast calcifications primarily in the subareolar region 11 mm in size. There was additional calcifications in the central the breast. Biopsy of the central calcification showed fibrocystic changes. 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 She was presented this morning at the multidisciplinary tumor board and she is here today to discuss a treatment plan.  I reviewed her records extensively and collaborated the history with the patient.  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  She was never exposed to fertility medications or hormone replacement therapy.  She has  family history of Breast/GYN/GI cancer Paternal grandmother with breast cancer  MEDICAL HISTORY:  Past Medical History  Diagnosis Date  . Arthritis     hands and knees  . Ovarian cyst   . 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  Breast cancer ([place])   . Anxiety     SURGICAL HISTORY: Past Surgical History  Procedure Laterality Date  . Cesarean section    . Cholecystectomy    . Achilles tendon repair    . Tubal ligation    . Appendectomy  [id number]    Early mucocele of appendix, removal of small leiomyoma from pelvis, lysis of adhesions  . Abdominal hysterectomy  05/1996    endometriosis    SOCIAL HISTORY: Social History   Social History  . Marital Status: Single    Spouse Name: N/A  . Number of Children: N/A  . Years of Education: N/A   Occupational History  . Not on file.   Social History Main Topics  . Smoking status: Former Smoker -- 1.00 packs/day for 10 years    Types: Cigarettes  . Smokeless tobacco: Never Used  . Alcohol Use: No  . Drug Use: No  . Sexual Activity: Yes    Birth Control/ Protection: Surgical     Comment: hysterectomy   Other Topics Concern  . Not on file   Social History Narrative    FAMILY HISTORY: Family History  Problem Relation Age of Onset  . Heart failure Father     ALLERGIES:  has No Known Allergies.  MEDICATIONS:  Current Outpatient Prescriptions  Medication Sig Dispense Refill  . amitriptyline (ELAVIL) 100 MG tablet Take 100 mg by mouth at bedtime.     No current facility-administered medications for this visit.    REVIEW OF SYSTEMS:   Constitutional: Denies fevers, chills or abnormal night sweats Eyes: Denies blurriness of vision, double vision or watery  eyes Ears, nose, mouth, throat, and face: Denies mucositis or sore throat Respiratory: Denies cough, dyspnea or wheezes Cardiovascular: Denies palpitation, chest discomfort or lower extremity swelling Gastrointestinal:  Denies nausea, heartburn or change in bowel habits Skin: Denies abnormal skin rashes Lymphatics: Denies new lymphadenopathy or easy bruising Neurological:Denies numbness, tingling or new weaknesses Behavioral/Psych: Mood is stable, no new changes  Breast:  Denies any palpable  lumps or discharge All other systems were reviewed with the patient and are negative.  PHYSICAL EXAMINATION: ECOG PERFORMANCE STATUS: 0 - Asymptomatic  Filed Vitals:   11/07/15 0850  BP: 130/81  Pulse: 75  Temp: 97.6 F (36.4 C)  Resp: 18   Filed Weights   11/07/15 0850  Weight: 192 lb 11.2 oz (87.408 kg)    GENERAL:alert, no distress and comfortable SKIN: skin color, texture, turgor are normal, no rashes or significant lesions EYES: normal, conjunctiva are pink and non-injected, sclera clear OROPHARYNX:no exudate, no erythema and lips, buccal mucosa, and tongue normal  NECK: supple, thyroid normal size, non-tender, without nodularity LYMPH:  no palpable lymphadenopathy in the cervical, axillary or inguinal LUNGS: clear to auscultation and percussion with normal breathing effort HEART: regular rate & rhythm and no murmurs and no lower extremity edema ABDOMEN:abdomen soft, non-tender and normal bowel sounds Musculoskeletal:no cyanosis of digits and no clubbing  PSYCH: alert & oriented x 3 with fluent speech NEURO: no focal motor/sensory deficits BREAST: No palpable nodules in breast. No palpable axillary or supraclavicular lymphadenopathy (exam performed in the presence of a chaperone)   LABORATORY DATA:  I have reviewed the data as listed Lab Results  Component Value Date   WBC 5.8 11/07/2015   HGB 14.3 11/07/2015   HCT 43.8 11/07/2015   MCV 87.8 11/07/2015   PLT 276 11/07/2015   Lab Results  Component Value Date   NA 142 11/07/2015   K 4.7 11/07/2015   CL 107 07/15/2014   CO2 26 11/07/2015    RADIOGRAPHIC STUDIES: I have personally reviewed the radiological reports and agreed with the findings in the report.  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 It is considered a precancerous lesion. DCIS is classified as a 0. It is generally detected through mammograms as calcifications. We discussed the significance of grades and its impact on prognosis. We also discussed the importance of ER and PR receptors and their implications to adjuvant treatment options. Prognosis of DCIS dependence on grade, comedo necrosis. It is anticipated that if not treated, 20-30% of DCIS can develop into invasive breast cancer.  Recommendation: MRI of the breast to determine the extent of disease, genetics counseling 1. Breast conserving surgery 2. Followed by adjuvant radiation therapy 3. Followed by antiestrogen therapy with tamoxifen 5 years  Tamoxifen counseling: We discussed the risks and benefits of tamoxifen. These include but not limited to insomnia, hot flashes, mood changes, vaginal dryness, and weight gain. Although rare, serious side effects including endometrial cancer, risk of blood clots were also discussed. We strongly believe that the benefits far outweigh the risks. Patient understands these risks and consented to starting treatment. Planned treatment duration is 5 years.  Return to clinic after surgery to discuss the final pathology report and come  up with an adjuvant treatment plan.     All questions were answered. The patient knows to call the clinic with any problems, questions or concerns.    Rulon Eisenmenger, MD 11/07/2015

## 2015-11-07 NOTE — Progress Notes (Signed)
Subjective:     Patient ID: Christina [name], female   DOB: 10/06/1965, 50 y.o.   MRN: [id number]  HPI   Review of Systems     Objective:   Physical Exam For the patient to understand and be given the tools to implement a healthy plant based diet during their cancer diagnosis.    Assessment:     Patient was seen today and found to be pleasant and accompanied by her seemingly supportive husband. PTs ht 5'6'', 192 pounds, BMI 30.7. Pts labs WNL. Pt was responisve to the recommendations/information but did not have questions.  Pt states her and her husband have talked about starting to walk and thinks she will start now.      Plan:     Dietitian educated the patient on implementing a plant based diet by incorporating more plant proteins, fruits, and vegetables. As a part of a healthy routine physical activity was discussed. A folder of evidence based information with a focus on a plant based diet and general nutrition during cancer was given to the patient.  The importance of legitimate, evidence based information was discussed and examples were given. As a part of the continuum of care the cancer dietitian's contact information was given to the patient in the event they would like to have a follow up appointment.

## 2015-11-07 NOTE — Progress Notes (Signed)
Radiation Oncology         [phone] ________________________________  Name: Christina [name] MRN: [id number]  Date: 11/07/2015  DOB: 1966-07-11  [id number] RUTH, MD  Stark Klein, MD     REFERRING PHYSICIAN: Stark Klein, MD   DIAGNOSIS: The encounter diagnosis was 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, left.   HISTORY OF PRESENT ILLNESS:Christina [name] is a 50 y.o. female with a newly diagnosed left breast cancer. She was seen on 10/11/15 for a screening mammogram which revealed calcifications and subsequently a diagnostic mammogram on 10/22/15 11 mm group of course calcifications anteriorly, and subareolar with amorphous calcifications centrally consistent with a milk of calcification. 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 She comes today for further evaluation and discussion of radiotherapy with Dr. Tammi [name].   PREVIOUS RADIATION THERAPY: No   PAST MEDICAL HISTORY:  Past Medical History  Diagnosis Date  . Arthritis     hands and knees  . Ovarian cyst   . 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 Breast cancer ([place])   . Anxiety        PAST SURGICAL HISTORY: Past Surgical History  Procedure Laterality Date  . Cesarean section    . Cholecystectomy    . Achilles tendon repair    . Tubal ligation    . Appendectomy  [id number]    Early mucocele of appendix, removal of small leiomyoma from pelvis, lysis of adhesions  . Abdominal hysterectomy  05/1996    endometriosis     FAMILY HISTORY:  Family History  Problem Relation Age of Onset  . Heart failure Father      SOCIAL HISTORY:  reports that she has quit smoking. Her smoking use included Cigarettes. She has a 10 pack-year smoking history. She has never used smokeless tobacco. She reports that she does not drink alcohol or use illicit drugs.   ALLERGIES: Review of patient's allergies indicates no known  allergies.   MEDICATIONS:  Current Outpatient Prescriptions  Medication Sig Dispense Refill  . amitriptyline (ELAVIL) 100 MG tablet Take 100 mg by mouth at bedtime.     No current facility-administered medications for this encounter.     REVIEW OF SYSTEMS: On review of systems, the patient states she is doing well. She denies any chest pain, shortness of breath, fevers, chills, nausea, vomiting, abdominal pain, urinary or bowel dysfunction. A complete review of systems is otherwise negative.    PHYSICAL EXAM:  BP 130/81 P 75 RR 18 T 97.6 Pulse Ox 100%  Pain scale 0/10 In general this is a well appearing Caucasian female in no acute distress. She is alert and oriented x4 and appropriate throughout the examination. HEENT reveals that the patient is normocephalic, atraumatic. EOMs are intact. Skin is intact without any evidence of gross lesions. Cardiovascular exam reveals a regular rate and rhythm, no clicks rubs or murmurs are auscultated. Chest is clear to auscultation bilaterally. Lymphatic assessment is performed and does not reveal any adenopathy in the cervical, supraclavicular, axillary, or inguinal chains.  Bilateral breasts are examined and no palpable masses are noted of either breast. There are post biopsy changes along the left breast with minimal ecchymosis, no nipple bleeding or discharge is noted. Abdomen has active bowel sounds in all quadrants and is intact. The abdomen is soft, non tender, non distended. Lower extremities are negative for pretibial pitting edema, deep calf tenderness, cyanosis or clubbing.  ECOG = 0  0 - Asymptomatic (Fully active, able to carry on all predisease activities without restriction)  1 - Symptomatic but completely ambulatory (Restricted in physically strenuous activity but ambulatory and able to carry out work of a light or sedentary nature. For example, light housework, office work)  2 - Symptomatic, <50% in bed during the day (Ambulatory  and capable of all self care but unable to carry out any work activities. Up and about more than 50% of waking hours)  3 - Symptomatic, >50% in bed, but not bedbound (Capable of only limited self-care, confined to bed or chair 50% or more of waking hours)  4 - Bedbound (Completely disabled. Cannot carry on any self-care. Totally confined to bed or chair)  5 - Death   Eustace Pen MM, Creech RH, Tormey DC, et al. [phone]). "Toxicity and response criteria of the [facility]". [place] Oncol. 5 (6): 649-55    LABORATORY DATA:  Lab Results  Component Value Date   WBC 5.8 11/07/2015   HGB 14.3 11/07/2015   HCT 43.8 11/07/2015   MCV 87.8 11/07/2015   PLT 276 11/07/2015   Lab Results  Component Value Date   NA 142 11/07/2015   K 4.7 11/07/2015   CL 107 07/15/2014   CO2 26 11/07/2015   Lab Results  Component Value Date   ALT 16 11/07/2015   AST 15 11/07/2015   ALKPHOS 49 11/07/2015   BILITOT 0.37 11/07/2015      RADIOGRAPHY: Mm Digital Diagnostic Unilat L  11/06/2015  CLINICAL DATA:  Stereotactic biopsy was performed of calcifications in the upper central left breast, posterior third. 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 EXAM: DIAGNOSTIC LEFT MAMMOGRAM POST STEREOTACTIC BIOPSY COMPARISON:  10/29/2015, 10/22/2015, 10/11/2015, 12/14/2013 FINDINGS: Mammographic images were obtained following stereotactic guided biopsy of calcifications in the upper central left breast, posterior third. An X shaped biopsy clip is satisfactorily positioned at the biopsy site. X shaped biopsy clip placed today is 6 cm superior and posterior to the coil shaped biopsy clip placed at the stereotactic biopsy performed 10/29/2015. IMPRESSION: Satisfactory position of X shaped biopsy clip in the upper central left breast. X shaped biopsy clip placed today is  approximately 6 cm superior and posterior to the coil shaped biopsy clip placed in the retroareolar left breast on 10/29/2015. Final Assessment: Post Procedure Mammograms for Marker Placement Electronically Signed   By: Curlene Dolphin M.D.   On: 11/06/2015 16:17   Mm Digital Diagnostic Unilat L  10/29/2015  CLINICAL DATA:  Post biopsy mammogram of the left breast for clip placement. EXAM: DIAGNOSTIC LEFT MAMMOGRAM POST STEREOTACTIC BIOPSY COMPARISON:  Previous exam(s). FINDINGS: Mammographic images were obtained following stereotactic guided biopsy of calcifications in the subareolar left breast. The coil shaped biopsy marking clip is appropriately positioned at the intended site of biopsy in the subareolar left breast IMPRESSION: Appropriate positioning of the coil shaped biopsy marking clip in the subareolar left breast. Final Assessment: Post Procedure Mammograms for Marker Placement Electronically Signed   By: Ammie Ferrier M.D.   On: 10/29/2015 09:56   Mm Digital Diagnostic Unilat L  10/22/2015  CLINICAL DATA:  50 year old female, callback from screening mammogram for left breast calcifications EXAM: DIGITAL DIAGNOSTIC LEFT MAMMOGRAM COMPARISON:  Previous exam(s). ACR Breast Density Category c: The breast tissue is heterogeneously dense, which may obscure small masses. FINDINGS: CC and lateral  magnification views of the left breast were performed. On the additional views, there is a new 11 mm group of coarse heterogeneous calcifications within the anterior, subareolar left breast. There are extensive additional, predominantly amorphous calcifications within the central left breast, many of which layer on the lateral view consistent with milk of calcium. IMPRESSION: Indeterminate calcifications within the anterior, subareolar left breast. RECOMMENDATION: Stereotactic guided left breast biopsy. If biopsy results are benign, six-month follow-up of the remaining left breast calcifications is recommended. I  have discussed the findings and recommendations with the patient. Results were also provided in writing at the conclusion of the visit. If applicable, a reminder letter will be sent to the patient regarding the next appointment. BI-RADS CATEGORY  4: Suspicious. Electronically Signed   By: Pamelia Hoit M.D.   On: 10/22/2015 17:54   Mm Digital Screening Bilateral  10/16/2015  CLINICAL DATA:  Screening. EXAM: DIGITAL SCREENING BILATERAL MAMMOGRAM WITH CAD COMPARISON:  Previous exam(s). ACR Breast Density Category c: The breast tissue is heterogeneously dense, which may obscure small masses. FINDINGS: In the left breast, calcifications warrant further evaluation. In the right breast, no findings suspicious for malignancy. Images were processed with CAD. IMPRESSION: Further evaluation is suggested for calcifications in the left breast. RECOMMENDATION: Diagnostic mammogram of the left breast. (Code:[id number]) The patient will be contacted regarding the findings, and additional imaging will be scheduled. BI-RADS CATEGORY  0: Incomplete. Need additional imaging evaluation and/or prior mammograms for comparison. Electronically Signed   By: Nolon Nations M.D.   On: 10/16/2015 11:30   Mm Lt Breast Bx W Loc Dev 1st Lesion Image Bx Spec Stereo Guide  11/06/2015  CLINICAL DATA:  Stereotactic biopsy was recommended of calcifications in the upper central right breast. Recently, calcifications were biopsied in the retroareolar left breast, demonstrating high-grade ductal carcinoma in situ. EXAM: BREAST STEREOTACTIC CORE NEEDLE BIOPSY COMPARISON:  Previous exams. FINDINGS: The patient and I discussed the procedure of stereotactic-guided biopsy including benefits and alternatives. We discussed the high likelihood of a successful procedure. We discussed the risks of the procedure including infection, bleeding, tissue injury, clip migration, and inadequate sampling. Informed written consent was given. The usual time out protocol  was performed immediately prior to the procedure. Using sterile technique and 2 % Lidocaine with and without epinephrine as local anesthetic, under stereotactic guidance, a 9 gauge vacuum assist device was used to perform core needle biopsy of calcifications in the upper central left breast using a superior to inferior approach. Specimen radiograph was performed showing multiple calcifications within 2 of the cores. Specimens with calcifications are identified for pathology. At the conclusion of the procedure, an X shaped tissue marker clip was deployed into the biopsy cavity. Follow-up 2-view mammogram was performed and dictated separately. IMPRESSION: Stereotactic-guided biopsy of the left breast. No apparent complications. Electronically Signed   By: Curlene Dolphin M.D.   On: 11/06/2015 16:13   Mm Lt Breast Bx W Loc Dev 1st Lesion Image Bx Spec Stereo Guide  10/30/2015  ADDENDUM REPORT: 10/30/2015 14:51 ADDENDUM: Pathology revealed HIGH GRADE DUCTAL CARCINOMA IN SITU WITH CALCIFICATIONS of the subareolar area of the Left breast. This was found to be concordant by Dr. Ammie Ferrier. Pathology results were discussed with the patient by telephone. The patient reported tenderness and is doing well after the biopsy. Post biopsy instructions and care were reviewed and questions were answered. The patient was encouraged to call [facility] for any additional concerns. The patient was referred to  [facility] on November 07, 2015. The patient is scheduled for a Left breast stereotatic biopsy on November 06, 2015. Pathology results reported by Terie Purser, RN on 10/30/2015. Electronically Signed   By: Ammie Ferrier M.D.   On: 10/30/2015 14:51  10/30/2015  CLINICAL DATA:  50 year old female presenting for stereotactic biopsy of left breast calcifications. EXAM: LEFT BREAST STEREOTACTIC CORE NEEDLE BIOPSY COMPARISON:   Previous exams. FINDINGS: The patient and I discussed the procedure of stereotactic-guided biopsy including benefits and alternatives. We discussed the high likelihood of a successful procedure. We discussed the risks of the procedure including infection, bleeding, tissue injury, clip migration, and inadequate sampling. Informed written consent was given. The usual time out protocol was performed immediately prior to the procedure. Using sterile technique and 1% Lidocaine as local anesthetic, under stereotactic guidance, a 9 gauge vacuum assisted device was used to perform core needle biopsy of calcifications in the subareolar left breast using a superior approach. Specimen radiograph was performed showing multiple calcifications within several cores. Specimens with calcifications are identified for pathology. At the conclusion of the procedure, a coil shaped tissue marker clip was deployed into the biopsy cavity. Follow-up 2-view mammogram was performed and dictated separately. IMPRESSION: Stereotactic-guided biopsy of calcifications in the subareolar left breast. No apparent complications. Electronically Signed: By: Ammie Ferrier M.D. On: 10/29/2015 09:46       IMPRESSION:  DCIS of the left breast   PLAN:  Dr. Lisbeth Renshaw discusses the pathology findings and reviews the nature of in situ disease. The consensus from the breast conference include proceeding with genetic testing, and an MRI to rule out extensive disease. If both of these are negative, or if she elects to proceed only with breast conservation with lumpectomy, we would recommend 33 fractions of external radiotherapy to the breast followed by antiestrogen therapy. He discusses with the patient the role of breath hold technique to to the location of her cancer.  He discusses the role of radiotherapy to treat microscopic disease and to reduce her risk of recurrence by 70%. The risks, benefits, and long term effects of radiation were reviewed with  the patient.  If she did undergo mastectomy for management of her disease, we would not anticipate a role for radiotherapy. We will follow her case along and see her back about 2-3 weeks following surgery if she does undergo lumpectomy.    The above documentation reflects my direct findings during this shared patient visit. Please see the separate note by Dr. Lisbeth Renshaw on this date for the remainder of the patient's plan of care.  Carola Rhine, PAC

## 2015-11-07 NOTE — Patient Instructions (Signed)
Contact our office if you have any questions following today's appointment: [phone].  

## 2015-11-07 NOTE — Progress Notes (Signed)
Christina [name] is a very pleasant 50 y.o. female from [place], [place] with newly diagnosed ductal carcinoma in situ of the left breast.  Biopsy results revealed the tumor's prognostic profile is ER positive and PR positive.  She presents today with her husband to the [facility]) for treatment consideration and recommendations from the breast surgeon, radiation oncologist, and medical oncologist.     I briefly met with Christina [name] and her husband during her [facility] visit today. We discussed the purpose of the Survivorship Clinic, which will include monitoring for recurrence, coordinating completion of age and gender-appropriate cancer screenings, promotion of overall wellness, as well as managing potential late/long-term side effects of anti-cancer treatments.    The treatment plan for Christina [name] will likely include surgery, radiation therapy, and anti-estrogen therapy.  She will meet with the Genetics Counselor due to her family history of breast cancer.  As of today, the intent of treatment for Christina [name] is cure, therefore she will be eligible for the Survivorship Clinic upon her completion of treatment.  Her survivorship care plan (SCP) document will be drafted and updated throughout the course of her treatment trajectory. She will receive the SCP in an office visit with myself in the Survivorship Clinic once she has completed treatment.   Christina [name] was encouraged to ask questions and all questions were answered to her satisfaction.  She was given my business card and encouraged to contact me with any concerns regarding survivorship.  I look forward to participating in her care.   Kenn File, [place] [phone]

## 2015-11-07 NOTE — Addendum Note (Signed)
Encounter addended by: Hayden Pedro, PA-C on: 11/07/2015  3:30 PM<BR>     Documentation filed: Follow-up Section

## 2015-11-07 NOTE — Progress Notes (Signed)
[facility] Psychosocial Distress Screening Clinical Social Work  Clinical Social Work met with pt and her husband, Christina [name] at [facility] to introduce self, explain role of CSW/Pt and Family Support Team and to review the distress screening protocol. Pt feeling much better and less anxious after learning of her plan today. Pt feels transportation will not be an issue for her and she plans to work through [facility] was referred by distress screening protocol.  The patient scored a 9 on the Psychosocial Distress Thermometer which indicates severe distress. Pt reports to feel less distressed after meeting with the team today. Pt feels she can mange her anxiety, but was encouraged to reach out to [facility] team if needed. Clinical Social Worker reviewed options for support and resources. Pt will consider and agrees to follow up as needed. Pt denied other needs or barriers to treatment.    ONCBCN DISTRESS SCREENING 11/07/2015  Screening Type Initial Screening  Distress experienced in past week (1-10) 9  Emotional problem type Nervousness/Anxiety  Referral to clinical social work Yes  Referral to support programs Yes    Clinical Social Worker follow up needed: No.  If yes, follow up plan:  Christina [name], [name]  [facility] Phone: [phone] Fax: [phone]

## 2015-11-07 NOTE — Assessment & Plan Note (Signed)
Left breast biopsy subareolar 10/29/2015: DCIS with calcifications, ER 95%, PR 90%, high-grade Screening detected left breast calcifications of irregular 11 mm biopsy DCIS high-grade, central calcifications biopsy was fibrocystic change  Pathology review: I discussed with the patient the difference between DCIS and invasive breast cancer. It is considered a precancerous lesion. DCIS is classified as a 0. It is generally detected through mammograms as calcifications. We discussed the significance of grades and its impact on prognosis. We also discussed the importance of ER and PR receptors and their implications to adjuvant treatment options. Prognosis of DCIS dependence on grade, comedo necrosis. It is anticipated that if not treated, 20-30% of DCIS can develop into invasive breast cancer.  Recommendation: MRI of the breast to determine the extent of disease, genetics counseling 1. Breast conserving surgery 2. Followed by adjuvant radiation therapy 3. Followed by antiestrogen therapy with tamoxifen 5 years  Tamoxifen counseling: We discussed the risks and benefits of tamoxifen. These include but not limited to insomnia, hot flashes, mood changes, vaginal dryness, and weight gain. Although rare, serious side effects including endometrial cancer, risk of blood clots were also discussed. We strongly believe that the benefits far outweigh the risks. Patient understands these risks and consented to starting treatment. Planned treatment duration is 5 years.  Return to clinic after surgery to discuss the final pathology report and come up with an adjuvant treatment plan.

## 2015-11-07 NOTE — Progress Notes (Deleted)
ONCBCN DISTRESS SCREENING 11/07/2015  Screening Type Initial Screening  Distress experienced in past week (1-10) 9  Emotional problem type Nervousness/Anxiety  Referral to clinical social work Yes  Referral to support programs Yes

## 2015-11-08 ENCOUNTER — Other Ambulatory Visit: Payer: BLUE CROSS/BLUE SHIELD

## 2015-11-08 ENCOUNTER — Encounter: Payer: Self-pay | Admitting: Genetic Counselor

## 2015-11-08 ENCOUNTER — Ambulatory Visit (HOSPITAL_BASED_OUTPATIENT_CLINIC_OR_DEPARTMENT_OTHER): Payer: BLUE CROSS/BLUE SHIELD | Admitting: Genetic Counselor

## 2015-11-08 DIAGNOSIS — Z800 Family history of malignant neoplasm of digestive organs: Secondary | ICD-10-CM | POA: Diagnosis not present

## 2015-11-08 DIAGNOSIS — C50112 Malignant neoplasm of central portion of left female breast: Secondary | ICD-10-CM | POA: Diagnosis not present

## 2015-11-08 DIAGNOSIS — Z8042 Family history of malignant neoplasm of prostate: Secondary | ICD-10-CM

## 2015-11-08 DIAGNOSIS — Z801 Family history of malignant neoplasm of trachea, bronchus and lung: Secondary | ICD-10-CM

## 2015-11-08 DIAGNOSIS — Z803 Family history of malignant neoplasm of breast: Secondary | ICD-10-CM | POA: Diagnosis not present

## 2015-11-08 DIAGNOSIS — Z315 Encounter for genetic counseling: Secondary | ICD-10-CM | POA: Diagnosis not present

## 2015-11-08 DIAGNOSIS — Z8052 Family history of malignant neoplasm of bladder: Secondary | ICD-10-CM

## 2015-11-08 NOTE — Progress Notes (Signed)
REFERRING PROVIDER: Nicholas Lose, MD  PRIMARY PROVIDER:  Marjorie Smolder, MD  PRIMARY REASON FOR VISIT:  1. 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 Family history of breast cancer in female   3. Family history of prostate cancer in father   67. Family history of colon cancer   5. Family history of bladder cancer   6. Family history of lung cancer      HISTORY OF PRESENT ILLNESS:   Christina [name], a 50 y.o. female, was seen for a [facility] cancer genetics consultation at the request of Dr. Lindi Adie due to a personal history of breast cancer at 43, family history of early onset breast cancer, and family history of prostate and other cancers.  Christina [name] presents to clinic today to discuss the possibility of a hereditary predisposition to cancer, genetic testing, and to further clarify her future cancer risks, as well as potential cancer risks for family members.   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  Hormone receptor status was ER/PR+. Surgical/treatment decisions are pending genetic test results.  Christina [name] also has a history of an "early mucocele" of the appendix, diagnosed in July 2012 at the age of 40.  This was treated with appendectomy.   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  First live birth at age 72.  OCP use for approximately 0 years.  Ovaries intact: Yes.  Hysterectomy: Yes, in 1997 for endometriosis.  Menopausal status: Postmenopausal.  HRT use: 0 years. Colonoscopy: No; not examined. Mammogram within the last year: Yes. Number of breast biopsies:  2. Up to date with pelvic exams:  Yes. Any excessive radiation exposure in the past:  No, but does report a history of secondhand smoke  Past Medical History  Diagnosis Date  . Arthritis     hands and knees  . Ovarian cyst   . 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 Breast cancer ([place])   . Anxiety     Past Surgical History  Procedure Laterality Date  . Cesarean section    . Cholecystectomy    . Achilles tendon repair    . Tubal ligation    . Appendectomy  [id number]    Early mucocele of appendix, removal of small leiomyoma from pelvis, lysis of adhesions  . Abdominal hysterectomy  05/1996    endometriosis    Social History   Social History  . Marital Status: Single    Spouse Name: N/A  . Number of Children: N/A  . Years of Education: N/A   Social History Main Topics  . Smoking status: Former Smoker -- 1.00 packs/day for 10 years    Types: Cigarettes  . Smokeless tobacco: Never Used  . Alcohol Use: No  . Drug Use: No  . Sexual Activity: Yes    Birth Control/ Protection: Surgical     Comment: hysterectomy   Other Topics Concern  . Not on file   Social History Narrative     FAMILY HISTORY:  We obtained a detailed, 4-generation family history.  Significant diagnoses are listed below:  Family History  Problem Relation Age of Onset  . Heart failure Father   . Prostate cancer Father 27  . Colon polyps Mother     approx 2  . Other Mother     hx HPV and hysterectomy due to precancerous cells  . Other Sister 79    hx of hysterectomy for unspecified reason; still has ovaries  . Other Sister 53    paternal half-sister hx of hysterectomy for unspecified reason; still has ovaries  . Bladder Cancer Maternal Uncle 79    not a smoker  . Kidney failure Maternal Grandmother   . Congestive Heart Failure Maternal Grandmother   . Colon cancer Maternal Grandmother 52  . Lung cancer Maternal Grandfather 58    smoker  . Breast cancer Paternal Grandmother      dx. early 23s; w/ hx of trauma to breast  . Crohn's disease Daughter   . Lung cancer Maternal Uncle 25    smoker    Christina [name] has one son, age 34, and two daughters, ages 48 and 71.  She currently has one grandson and one granddaughter, with one granddaughter on the way.  Christina [name] has one full sister, age 77, who has no history of cancer but who does have a history of a hysterectomy at age 54 for an unspecified reason.  She also has one paternal half-sister who is currently 67 who also has a history of a hysterectomy at 50 for an unspecified reason.  Christina [name] mother is currently 58 and also has a history of a hysterectomy--this was due to HPV and a history of pre-cancerous cells.  Christina [name] mother has a history of approximately two colon polyps.  Christina [name]'s father died of a heart attack at the age of 63.  He was diagnosed with prostate cancer at the age of 57.  Christina [name]'s father had two full brothers--both are currently between age 77 and 1 and neither have ever had cancer.  One uncle has one son and a daughter; the other uncle has one son.  There is no history of cancer in these paternal first cousins.  Christina [name]'s paternal grandmother died of breast cancer in her early 48s; she reportedly had a trauma to her breast prior to this diagnosis.  Christina [name]'s paternal grandfather died at the age of 43.  She has no information for any paternal great aunts/uncles or great grandparents.  Christina [name]'s mother has two full brothers and one full sister.  Her sister died as a baby in a car accident at the age of 50 months.  One brother died of lung cancer at the age of 53; he was a smoker.  He has two sons and one daughter; one son died of an overdose in his early 41s and the other two children are currently in their early 72s.  One brother is still living at 82; he was diagnosed with bladder cancer recently and has no history of smoking.  Christina [name]'s maternal grandmother died of kidney failure  and congestive heart failure at the age of 29.  She had a history of colon cancer which was diagnosed at the age of 6.  Christina [name]'s maternal grandfather died of lung cancer at 45; he was a smoker.  She has no further information for any maternal great aunts/uncles or great grandparents.     Patient's maternal and paternal ancestors are of Caucasian descent. There is no reported Ashkenazi Jewish ancestry. There is no known consanguinity.  GENETIC  COUNSELING ASSESSMENT: FELIX PRATT is a 50 y.o. female with a personal and family history of cancer which is somewhat suggestive of a hereditary breast cancer syndrome and predisposition to cancer. We, therefore, discussed and recommended the following at today's visit.   DISCUSSION: We reviewed the characteristics, features and inheritance patterns of hereditary cancer syndromes, particularly those caused by mutations within the BRCA1/2, CHEK2, and Lynch syndrome genes. We also discussed genetic testing, including the appropriate family members to test, the process of testing, insurance coverage and turn-around-time for results. We discussed the implications of a negative, positive and/or variant of uncertain significant result. We recommended Christina [name] pursue genetic testing for the 20-gene Breast/Ovarian Cancer Panel through [facility].  The Breast/Ovarian Cancer Panel offered by [facility] Hope Pigeon, MD) includes sequencing and deletion/duplication analysis for the following 19 genes:  ATM, BARD1, BRCA1, BRCA2, BRIP1, CDH1, CHEK2, FANCC, MLH1, MSH2, MSH6, NBN, PALB2, PMS2, PTEN, RAD51C, RAD51D, TP53, and XRCC2.  This panel also includes deletion/duplication analysis (without sequencing) for one gene, EPCAM.  Based on Christina [name]'s personal and family history of cancer, she meets medical criteria for genetic testing. Despite that she meets criteria, she may still have an out of pocket cost. We discussed that if her out of pocket cost  for testing is over $100, the laboratory will call and confirm whether she wants to proceed with testing.  If the out of pocket cost of testing is less than $100 she will be billed by the genetic testing laboratory.    PLAN: After considering the risks, benefits, and limitations, Christina [name]  provided informed consent to pursue genetic testing and the blood sample was sent to [facility] for analysis of the 20-gene Breast/Ovarian Cancer Panel. Results are generally available within approximately 2-3 weeks' time, however, since Ms. Borger will use these results to help inform her surgical/treatment decisions, we will ask that [facility] return these results STAT.  Thus, we should be able to expect these results back closer to the 2-week time frame, at which point they will be disclosed by telephone to Ms. Locust, as will any additional recommendations warranted by these results. Ms. Zehner will receive a summary of her genetic counseling visit and a copy of her results once available. This information will also be available in Epic. We encouraged Ms. Palau to remain in contact with cancer genetics annually so that we can continuously update the family history and inform her of any changes in cancer genetics and testing that may be of benefit for her family. Ms. Knabe's questions were answered to her satisfaction today. Our contact information was provided should additional questions or concerns arise.  Thank you for the referral and allowing [name] to share in the care of your patient.   Jeanine Luz, MS, [facility] Certified Genetic Counselor [email or web address] Phone: [phone]  The patient was seen for a total of 60 minutes in face-to-face genetic counseling.  This patient was discussed with Drs. Magrinat, Lindi Adie and/or Burr Medico who agrees with the above.    _______________________________________________________________________ For Office Staff:  Number of people involved in session: 1 Was an  Intern/ student involved with case: No

## 2015-11-09 ENCOUNTER — Ambulatory Visit
Admission: RE | Admit: 2015-11-09 | Discharge: 2015-11-09 | Disposition: A | Discharge status: Home / Self Care | Payer: BLUE CROSS/BLUE SHIELD | Source: Ambulatory Visit | Attending: General Surgery | Admitting: General Surgery

## 2015-11-09 DIAGNOSIS — C50112 Malignant neoplasm of central portion of left female breast: Secondary | ICD-10-CM

## 2015-11-09 MED ORDER — GADOBENATE DIMEGLUMINE 529 MG/ML IV SOLN
15.0000 mL | Freq: Once | INTRAVENOUS | Status: AC | PRN
  Administered 2015-11-09: 15 mL via INTRAVENOUS

## 2015-11-12 ENCOUNTER — Telehealth: Payer: Self-pay | Admitting: *Deleted

## 2015-11-12 NOTE — Telephone Encounter (Signed)
Spoke to pt concerning Howard from 11/07/15. Denies questions or concerns regarding dx or treatment care plan. Encourage pt to call with needs. Received verbal understanding.

## 2015-11-22 ENCOUNTER — Telehealth: Payer: Self-pay | Admitting: Genetic Counselor

## 2015-11-22 ENCOUNTER — Telehealth: Payer: Self-pay

## 2015-11-22 NOTE — Telephone Encounter (Signed)
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  Discussed that this is a moderate risk gene and that this confers a lifetime breast cancer risk of approximately 24-40%.  Discussed that additional breast screening is recommended--annual mammograms and consider breast MRIs w/ contrast.  Surgical recommendations from the NCCN state: "risk-reducing mastectomy be considered based on family history".  Christina [name]'s surgery date has not be set yet.  Discussed that no known increased risk for ovarian cancer has been found with mutations in this gene.  Also discussed that there may be other cancer risks, such as prostate, pancreatic, and colon cancer risks that we find are increased by mutations in this gene in the future, but at this time there is not significant evidence.  Discussed that her three children are all at a 50% for having inherited this same mutation.  This information will be helpful for her daughters to have, so that they will have a better idea of their own breast cancer risks.  This information may not be as helpful for Christina [name] son to have, since we are not certain of cancer risks for men at this time, but, he can still have testing for this genetic change, and he would be on our radar for future cancer risks information updates in the future, if he were to test positive.  It can also be helpful information for his own family planning.  Discussed that we can offer testing to Christina [name] mother to see if this mutation is being inherited from her side of the family.  This is helpful information for more distant relatives to have.  Christina [name] would like to come back in for a follow-up appointment, but would like to first find out about when her surgery will be.  She will call me to set-up a follow-up appointment after learning this information.  She knows she is welcome to call me with any questions  she has.

## 2015-11-22 NOTE — Telephone Encounter (Signed)
Patient called for genetic test results.  Christina [name] aware.

## 2015-11-23 ENCOUNTER — Ambulatory Visit: Payer: Self-pay | Admitting: Genetic Counselor

## 2015-11-23 DIAGNOSIS — Z803 Family history of malignant neoplasm of breast: Secondary | ICD-10-CM

## 2015-11-23 DIAGNOSIS — Z1501 Genetic susceptibility to malignant neoplasm of breast: Secondary | ICD-10-CM

## 2015-11-23 DIAGNOSIS — Z1589 Genetic susceptibility to other disease: Secondary | ICD-10-CM

## 2015-11-23 DIAGNOSIS — C50112 Malignant neoplasm of central portion of left female breast: Secondary | ICD-10-CM

## 2015-11-23 DIAGNOSIS — Z809 Family history of malignant neoplasm, unspecified: Secondary | ICD-10-CM

## 2015-11-23 DIAGNOSIS — Z800 Family history of malignant neoplasm of digestive organs: Secondary | ICD-10-CM

## 2015-11-23 DIAGNOSIS — Z1509 Genetic susceptibility to other malignant neoplasm: Secondary | ICD-10-CM

## 2015-11-23 DIAGNOSIS — Z1379 Encounter for other screening for genetic and chromosomal anomalies: Secondary | ICD-10-CM

## 2015-11-26 ENCOUNTER — Ambulatory Visit (HOSPITAL_BASED_OUTPATIENT_CLINIC_OR_DEPARTMENT_OTHER): Payer: BLUE CROSS/BLUE SHIELD | Admitting: Hematology and Oncology

## 2015-11-26 ENCOUNTER — Encounter: Payer: Self-pay | Admitting: Hematology and Oncology

## 2015-11-26 VITALS — BP 129/79 | HR 77 | Temp 98.6°F | Resp 18 | Wt 199.9 lb

## 2015-11-26 DIAGNOSIS — C50112 Malignant neoplasm of central portion of left female breast: Secondary | ICD-10-CM

## 2015-11-26 NOTE — Progress Notes (Signed)
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 She is here to discuss the implications of this finding and to discuss it treatment plan.  REVIEW OF SYSTEMS:   Constitutional: Denies fevers, chills or abnormal weight loss Eyes: Denies blurriness of vision Ears, nose, mouth, throat, and face: Denies mucositis or sore throat Respiratory: Denies cough, dyspnea or wheezes Cardiovascular: Denies palpitation, chest discomfort Gastrointestinal:  Denies nausea, heartburn or change in bowel habits Skin: Denies abnormal skin rashes Lymphatics: Denies new lymphadenopathy or easy bruising Neurological:Denies numbness, tingling or new weaknesses Behavioral/Psych: Mood is stable, no new changes  Extremities: No lower extremity edema Breast:  denies any pain or lumps or nodules in either breasts All other systems were reviewed with the patient and are  negative.  I have reviewed the past medical history, past surgical history, social history and family history with the patient and they are unchanged from previous note.  ALLERGIES:  has No Known Allergies.  MEDICATIONS:  Current Outpatient Prescriptions  Medication Sig Dispense Refill  . amitriptyline (ELAVIL) 100 MG tablet Take 100 mg by mouth at bedtime.     No current facility-administered medications for this visit.    PHYSICAL EXAMINATION: ECOG PERFORMANCE STATUS: 0 - Asymptomatic  Filed Vitals:   11/26/15 1513  BP: 129/79  Pulse: 77  Temp: 98.6 F (37 C)  Resp: 18   Filed Weights   11/26/15 1513  Weight: 199 lb 14.4 oz (90.674 kg)    GENERAL:alert, no distress and comfortable SKIN: skin color, texture, turgor are normal, no rashes or significant lesions EYES: normal, Conjunctiva are pink and non-injected, sclera clear OROPHARYNX:no exudate, no erythema and lips, buccal mucosa, and tongue normal  NECK: supple, thyroid normal size, non-tender, without nodularity LYMPH:  no palpable lymphadenopathy in the cervical, axillary or inguinal LUNGS: clear to auscultation and percussion with normal breathing effort HEART: regular rate & rhythm and no murmurs and no lower extremity edema ABDOMEN:abdomen soft, non-tender and normal bowel sounds MUSCULOSKELETAL:no cyanosis of digits and no clubbing  NEURO: alert & oriented x 3 with fluent speech, no focal motor/sensory deficits EXTREMITIES: No lower extremity edema  LABORATORY DATA:  I have reviewed the data as listed   Chemistry      Component Value Date/Time   NA 142 11/07/2015 0839   NA 141 07/15/2014 1850   K 4.7 11/07/2015 0839   K 3.9 07/15/2014 1850   CL 107 07/15/2014 1850   CO2 26 11/07/2015 0839   CO2  26 07/15/2014 1850   BUN 12.2 11/07/2015 0839   BUN 16 07/15/2014 1850   CREATININE 0.9 11/07/2015 0839   CREATININE 0.78 07/15/2014 1850      Component Value Date/Time   CALCIUM 9.9 11/07/2015 0839    CALCIUM 9.8 07/15/2014 1850   ALKPHOS 49 11/07/2015 0839   ALKPHOS 50 07/15/2014 1850   AST 15 11/07/2015 0839   AST 15 07/15/2014 1850   ALT 16 11/07/2015 0839   ALT 14 07/15/2014 1850   BILITOT 0.37 11/07/2015 0839   BILITOT <0.2* 07/15/2014 1850       Lab Results  Component Value Date   WBC 5.8 11/07/2015   HGB 14.3 11/07/2015   HCT 43.8 11/07/2015   MCV 87.8 11/07/2015   PLT 276 11/07/2015   NEUTROABS 3.4 11/07/2015     ASSESSMENT & PLAN:  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 She does not have any family history of breast cancer hence believe her risk is generally between 20-30%. There is also risk of colon cancer and pancreatic cancer as well as prostate cancer in men. I recommended that she undergo colonoscopy every 5 years. For breast cancer surveillance: I offered the patient 2 options. She can undergo breast reduction bilateral mastectomies or she can undergo active surveillance with mammogram and breast MRI every year. At this point patient chose to undergo active surveillance with mammograms and annual breast MRIs.  Recommendation:  1. Breast conserving surgery 2. Followed by adjuvant radiation therapy 3. Followed by antiestrogen therapy with tamoxifen 5 years  Return to clinic after surgery to discuss the final pathology report and come up with an adjuvant treatment plan. I informed of the patient's decision to Dr. Barry Dienes so that she can plan her surgery. Thank you  No orders of the defined types were  placed in this encounter.   The patient has a good understanding of the overall plan. she agrees with it. she will call with any problems that may develop before the next visit here.   Rulon Eisenmenger, MD 11/26/2015

## 2015-11-26 NOTE — Assessment & Plan Note (Signed)
Left breast biopsy subareolar 10/29/2015: DCIS with calcifications, ER 95%, PR 90%, high-grade Screening detected left breast calcifications of irregular 11 mm biopsy DCIS high-grade, central calcifications biopsy was fibrocystic change Genetic testing: ATM mutation  ATM gene mutation: I discussed with the patient that having ATM gene mutation leads to 2-3 times increased risk of breast cancer which translates into a 30% lifetime risk of breast cancer. She does not have any family history of breast cancer hence believe her risk is generally between 20-30%. There is also risk of colon cancer and pancreatic cancer as well as prostate cancer in men. I recommended that she undergo colonoscopy every 5 years. For breast cancer surveillance: I offered the patient 2 options. She can undergo breast reduction bilateral mastectomies or she can undergo active surveillance with mammogram and breast MRI every year. At this point patient chose to undergo active surveillance with mammograms and annual breast MRIs.  Recommendation:  1. Breast conserving surgery 2. Followed by adjuvant radiation therapy 3. Followed by antiestrogen therapy with tamoxifen 5 years  Tamoxifen counseling: We discussed the risks and benefits of tamoxifen. These include but not limited to insomnia, hot flashes, mood changes, vaginal dryness, and weight gain. Although rare, serious side effects including endometrial cancer, risk of blood clots were also discussed. We strongly believe that the benefits far outweigh the risks. Patient understands these risks and consented to starting treatment. Planned treatment duration is 5 years.  Return to clinic after surgery to discuss the final pathology report and come up with an adjuvant treatment plan.

## 2015-11-28 ENCOUNTER — Other Ambulatory Visit: Payer: Self-pay | Admitting: General Surgery

## 2015-11-28 ENCOUNTER — Telehealth: Payer: Self-pay

## 2015-11-28 DIAGNOSIS — C50112 Malignant neoplasm of central portion of left female breast: Secondary | ICD-10-CM

## 2015-11-28 NOTE — Telephone Encounter (Signed)
Pt called to let Dr. Lindi Adie know she has changed her mind about surgeries and wants to go ahead with bilateral mastectomies.  Routed to Dr. Lindi Adie and Dr. Barry Dienes.

## 2015-11-29 ENCOUNTER — Telehealth: Payer: Self-pay | Admitting: *Deleted

## 2015-11-29 NOTE — Telephone Encounter (Signed)
Called pt to verify surgery decision of bilateral mastectomies. Pt verifies wishes. Asked pt if she would like reconstruction. Pt relay she would like that if her insurance covers it. Scheduled and confirmed appt for pt to see Dr. Marla Roe on 12/07/15 at 1:00PM. Denies further needs at this time. Encourage pt to call with questions.

## 2015-12-07 ENCOUNTER — Other Ambulatory Visit: Payer: Self-pay | Admitting: General Surgery

## 2015-12-07 DIAGNOSIS — C50112 Malignant neoplasm of central portion of left female breast: Secondary | ICD-10-CM

## 2015-12-13 DIAGNOSIS — F331 Major depressive disorder, recurrent, moderate: Secondary | ICD-10-CM | POA: Diagnosis not present

## 2015-12-14 ENCOUNTER — Other Ambulatory Visit: Payer: Self-pay | Admitting: *Deleted

## 2015-12-14 ENCOUNTER — Telehealth: Payer: Self-pay | Admitting: Hematology and Oncology

## 2015-12-14 NOTE — Telephone Encounter (Signed)
left msg for appt 5/12 @ 10:15

## 2015-12-17 ENCOUNTER — Telehealth: Payer: Self-pay | Admitting: Genetic Counselor

## 2015-12-17 NOTE — Telephone Encounter (Signed)
Discussed setting up a follow-up genetic counseling appt now that Christina [name]'s surgery is set for 5/3.  She is trying not to miss too much work since she will be out for her surgery, so she would like to come in during that 2-week period following her surgery that she will already be off from work.  We made a plan for her to call me when she feels recuperated enough to come in, and we can schedule something then.  In the meantime, I will send her a letter so that she has more information on her test result, and also so that she has a copy of her test result and some copies to share with other relatives who may want to get genetic counseling and testing in the meantime.  She has my office number if she has any questions in the meantime.

## 2015-12-18 DIAGNOSIS — F330 Major depressive disorder, recurrent, mild: Secondary | ICD-10-CM | POA: Diagnosis not present

## 2015-12-18 DIAGNOSIS — M7711 Lateral epicondylitis, right elbow: Secondary | ICD-10-CM | POA: Diagnosis not present

## 2015-12-18 DIAGNOSIS — R350 Frequency of micturition: Secondary | ICD-10-CM | POA: Diagnosis not present

## 2015-12-18 DIAGNOSIS — N3000 Acute cystitis without hematuria: Secondary | ICD-10-CM | POA: Diagnosis not present

## 2015-12-31 ENCOUNTER — Encounter: Payer: Self-pay | Admitting: Genetic Counselor

## 2015-12-31 DIAGNOSIS — Z1379 Encounter for other screening for genetic and chromosomal anomalies: Secondary | ICD-10-CM | POA: Insufficient documentation

## 2015-12-31 DIAGNOSIS — Z1589 Genetic susceptibility to other disease: Secondary | ICD-10-CM | POA: Insufficient documentation

## 2015-12-31 DIAGNOSIS — Z1501 Genetic susceptibility to malignant neoplasm of breast: Secondary | ICD-10-CM | POA: Insufficient documentation

## 2015-12-31 DIAGNOSIS — Z1509 Genetic susceptibility to other malignant neoplasm: Secondary | ICD-10-CM | POA: Insufficient documentation

## 2015-12-31 NOTE — Progress Notes (Signed)
GENETIC TEST RESULTS   Patient Name: Christina [name] Patient Age: 50 y.o. 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 Berkovich was seen in the [facility] on personal history of breast cancer at 55, family history of breast, prostate and other cancers, and concern regarding a hereditary predisposition to cancer in the family. Please refer to the prior Genetics clinic note from November 08, 2015 for more information regarding Ms. Minchew's medical and family histories and our assessment at the time.   GENETIC TESTING:  At the time of Ms. Roser's visit on November 08, 2015, we recommended she pursue genetic testing of the 20 genes on the Breast/Ovarian Cancer Panel. This test included sequencing and deletion/duplication analysis of the following genes: ATM, BARD1, BRCA1, BRCA2, BRIP1, CDH1, CHEK2, EPCAM, FANCC, MLH1, MSH2, MSH6, NBN, PALB2, PMS2, PTEN, RAD51C, RAD51D, TP53, and XRCC2, and was performed at [facility] in [place], [place].  Testing revealed a pathogenic mutation in one copy of the ATM gene called "c.3154-2A>G (IVS21-2A>G)".  ATM GENE: The ATM gene is involved in cell growth and division rate as well as in the detection and surveillance of DNA damage.  A pathogenic mutation within the ATM gene can thus increase risks for certain types of cancer.  MEDICAL MANAGEMENT: Women who have one ATM gene mutation have an approximate 2-fold increased risk for breast cancer (approximately 27-40% by age 46y).   Ms. Deckard is at increased risk of a second breast cancer and we, therefore, discussed the recommendations below from the [employer]  (NCCN; version 2.2017) that are specific to women with an ATM mutation.   Breast Cancer:  - Annual mammogram and consider breast MRI with contrast starting at age 84y - Option of risk-reducing  bilateral mastectomies should be considered based on family history  Regular self breast exams are also recommended, as well as are clinical breast exams annually or twice annually.  Ovarian Cancer: - No increased risk of ovarian cancer.  Other Cancers: - Unknown or insufficient evidence for pancreas or prostate cancer risks. - Additional cancer screening can be individualized based on family history.  There is no family history of pancreatic or melanoma cancers.  Ms. Wintle's father was diagnosed with prostate cancer at 55.  Her maternal grandmother had colon cancer at 25.  At this time, there is likely no additional cancer screening that would be recommended.   Ms. Peek will have bilateral mastectomies on Jan 09, 2016, and, thus, she will have taken the most effective option available to reduce breast cancer risk. Following which, we recommend she continue to follow healthcare management guidelines that have been provided to her by her overseeing providers.   FAMILY MEMBERS: It is important that all of Ms. Mccurley's relatives (both men and women) know of the presence of this gene mutation. Site-specific genetic testing can sort out who in the family is at risk and who is not.   Ms. Kraner's children and sisters have a 50% chance to have inherited this mutation. We recommend they have genetic testing for this same mutation, as identifying the presence of this mutation would allow them to also take advantage of risk-reducing measures.   ATAXIA-TELANGIECTASIA: Individuals with a ATM mutation are at a greater risk for having children with a rare autosomal recessive condition called Ataxia-telangiectasia (A-T). AT is characterized by progressive cerebellar degeneration (ataxia), dilated blood vessels in the eyes and skin (  telangiectasia), immunodeficiency, chromosomal instability, increased sensitivity to ionizing radiation and a predisposition to lymphoma and leukemia.  Both parents must be carriers  of a pathogenic ATM mutation to have a chance to have a child with AT.  If both parents are carriers, there is a 25% chance that their child will be affected.  Therefore, individuals of childbearing age who have a known ATM mutation may want to consider having their spouse tested to determine their risk for having a child with AT.  These individuals can discuss testing with a prenatal genetic counselor, and the best time to discuss is when one is thinking about starting a family.  SUPPORT AND RESOURCES: If Ms. Camino is interested in ATM-specific information and support, there are two groups, Facing Our Risk of Cancer Empowered (FORCE; www.facingourrisk.com) and Bright Pink (www.brightpink.org) which some people have found useful. They provide opportunities to speak with other individuals from high-risk families. To locate genetic counselors in other cities, visit the website of the [employer] (ArtistMovie.se) and search for a Oncologist by zip code.  We encouraged Ms. Chestang to meet with [name] in person for a follow-up results session to go over these recommendations in more detail and so that we can answer any of her questions.  She would like to plan to meet during the week following her surgery, so that she does not have to take any additional time off work.  We also recommend that she remain in contact with [name] on an annual basis so we can update her personal and family histories, and let her know of advances in cancer genetics that may benefit the family. Our contact number was provided. Ms. Herzig's questions were answered to her satisfaction today, and she knows she is welcome to call anytime with additional questions.    Jeanine Luz, [place], [place] Certified Genetic Counselor Phone: [phone] [email or web address]

## 2016-01-01 ENCOUNTER — Inpatient Hospital Stay (HOSPITAL_COMMUNITY)
Admission: RE | Admit: 2016-01-01 | Discharge: 2016-01-01 | Disposition: A | Discharge status: Home / Self Care | Payer: BLUE CROSS/BLUE SHIELD | Source: Ambulatory Visit

## 2016-01-03 ENCOUNTER — Other Ambulatory Visit: Payer: Self-pay | Admitting: Plastic Surgery

## 2016-01-03 DIAGNOSIS — C50112 Malignant neoplasm of central portion of left female breast: Secondary | ICD-10-CM

## 2016-01-03 NOTE — H&P (Signed)
Christina [name] is an 50 y.o. female.   Chief Complaint: breast cancer HPI: The patient is a 50 yrs old wf here for breast reconstruction. She went for a screening mammogram and was found to have calcifications of the LEFT breast in the subareolar location. The upper central location was also positive for calcifications and fibrocystic disease. 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 She is 5 feet 7 inches tall, weighs 189 pounds, Preop = 36 C. Her genetics was positive but she is not sure about which one was positive. She does have her kidneys and is wanting to have bilateral mastectomies. She is in not smoking (quit 2009) and has family support with her friends and kids. She had several surgeries as listed below. All referring reports were reviewed. She has mild ptosis of her breasts. No masses palpated.    Past Medical History  Diagnosis Date  . Arthritis     hands and knees  . Ovarian cyst   . 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 Breast cancer ([place])   . Anxiety     Past Surgical History  Procedure Laterality Date  . Cesarean section    . Cholecystectomy    . Achilles tendon repair    . Tubal ligation    . Appendectomy  [id number]    Early mucocele of appendix, removal of small leiomyoma from pelvis, lysis of adhesions  . Abdominal hysterectomy  05/1996    endometriosis    Family History  Problem Relation Age of Onset  . Heart failure Father   . Prostate cancer Father 44  . Colon polyps Mother     approx 2  . Other Mother     hx HPV and hysterectomy due to precancerous cells  . Other Sister 38    hx of hysterectomy for unspecified reason; still has ovaries  . Other Sister 23    paternal half-sister hx of hysterectomy for unspecified reason; still has ovaries  . Bladder Cancer Maternal Uncle 79    not a smoker  . Kidney failure Maternal Grandmother   . Congestive Heart Failure Maternal Grandmother    . Colon cancer Maternal Grandmother 63  . Lung cancer Maternal Grandfather 46    smoker  . Breast cancer Paternal Grandmother     dx. early 18s; w/ hx of trauma to breast  . Crohn's disease Daughter   . Lung cancer Maternal Uncle 20    smoker   Social History:  reports that she has quit smoking. Her smoking use included Cigarettes. She has a 10 pack-year smoking history. She has never used smokeless tobacco. She reports that she does not drink alcohol or use illicit drugs.  Allergies: No Known Allergies   (Not in a hospital admission)  No results found for this or any previous visit (from the past 48 hour(s)). No results found.  Review of Systems  Constitutional: Negative.   HENT: Negative.   Eyes: Negative.   Respiratory: Negative.   Cardiovascular: Negative.   Gastrointestinal: Negative.   Genitourinary: Negative.   Musculoskeletal: Negative.   Skin: Negative.   Neurological: Negative.   Psychiatric/Behavioral: Negative.     There were no vitals taken for this visit. Physical Exam  Constitutional: She is oriented to person, place, and time. She appears well-developed and well-nourished.  HENT:  Head: Normocephalic and atraumatic.  Eyes: Conjunctivae and EOM are normal. Pupils are equal, round, and reactive to light.  Cardiovascular: Normal  rate.   Respiratory: Effort normal.  GI: Soft.  Neurological: She is alert and oriented to person, place, and time.  Skin: Skin is warm.  Psychiatric: She has a normal mood and affect. Her behavior is normal. Judgment and thought content normal.     Assessment/Plan Once all reconstruction options were presented, a focused discussion was had regarding the patient's suitability for each of these procedures. She would like to have bilateral immediate breast reconstruction with expanders and acellular dermal matrix placed. She is aware the NAC may need to be ressected on the left. This may depend on the path  intraoperatively.  Wallace Going, DO 01/03/2016, 7:48 AM

## 2016-01-07 ENCOUNTER — Encounter (HOSPITAL_COMMUNITY)
Admission: RE | Admit: 2016-01-07 | Discharge: 2016-01-07 | Disposition: A | Discharge status: Home / Self Care | Payer: BLUE CROSS/BLUE SHIELD | Source: Ambulatory Visit | Attending: General Surgery | Admitting: General Surgery

## 2016-01-07 ENCOUNTER — Encounter (HOSPITAL_COMMUNITY): Payer: Self-pay

## 2016-01-07 VITALS — BP 117/69 | HR 69 | Temp 97.7°F | Resp 20 | Ht 67.5 in | Wt 196.3 lb

## 2016-01-07 DIAGNOSIS — C50112 Malignant neoplasm of central portion of left female breast: Secondary | ICD-10-CM

## 2016-01-07 DIAGNOSIS — Z87891 Personal history of nicotine dependence: Secondary | ICD-10-CM | POA: Diagnosis not present

## 2016-01-07 DIAGNOSIS — E669 Obesity, unspecified: Secondary | ICD-10-CM | POA: Diagnosis not present

## 2016-01-07 DIAGNOSIS — Z90710 Acquired absence of both cervix and uterus: Secondary | ICD-10-CM | POA: Diagnosis not present

## 2016-01-07 DIAGNOSIS — Z6830 Body mass index (BMI) 30.0-30.9, adult: Secondary | ICD-10-CM | POA: Diagnosis not present

## 2016-01-07 DIAGNOSIS — F419 Anxiety disorder, unspecified: Secondary | ICD-10-CM | POA: Diagnosis not present

## 2016-01-07 HISTORY — DX: Other specified postprocedural states: Z98.890

## 2016-01-07 HISTORY — DX: Depression, unspecified: F32.A

## 2016-01-07 HISTORY — DX: Urgency of urination: R39.15

## 2016-01-07 HISTORY — DX: Nausea with vomiting, unspecified: R11.2

## 2016-01-07 HISTORY — DX: Major depressive disorder, single episode, unspecified: F32.9

## 2016-01-07 LAB — CBC
HCT: 41.5 % (ref 36.0–46.0)
Hemoglobin: 13.6 g/dL (ref 12.0–15.0)
MCH: 28.9 pg (ref 26.0–34.0)
MCHC: 32.8 g/dL (ref 30.0–36.0)
MCV: 88.3 fL (ref 78.0–100.0)
Platelets: 258 10*3/uL (ref 150–400)
RBC: 4.7 MIL/uL (ref 3.87–5.11)
RDW: 13.5 % (ref 11.5–15.5)
WBC: 8.1 10*3/uL (ref 4.0–10.5)

## 2016-01-07 LAB — BASIC METABOLIC PANEL
Anion gap: 8 (ref 5–15)
BUN: 12 mg/dL (ref 6–20)
CO2: 23 mmol/L (ref 22–32)
Calcium: 9.5 mg/dL (ref 8.9–10.3)
Chloride: 109 mmol/L (ref 101–111)
Creatinine, Ser: 0.76 mg/dL (ref 0.44–1.00)
GFR calc Af Amer: >60 mL/min (ref >60)
GFR calc non Af Amer: >60 mL/min (ref >60)
Glucose, Bld: 128 mg/dL — ABNORMAL HIGH (ref 65–99)
Potassium: 4.2 mmol/L (ref 3.5–5.1)
Sodium: 140 mmol/L (ref 135–145)

## 2016-01-08 NOTE — Anesthesia Preprocedure Evaluation (Addendum)
Anesthesia Evaluation  Patient identified by MRN, date of birth, ID band Patient awake    Reviewed: Allergy & Precautions, NPO status , Patient's Chart, lab work & pertinent test results  History of Anesthesia Complications (+) PONV and history of anesthetic complications  Airway Mallampati: II  TM Distance: >3 FB Neck ROM: Full    Dental  (+) Teeth Intact, Dental Advisory Given, Partial Upper   Pulmonary former smoker,    Pulmonary exam normal breath sounds clear to auscultation       Cardiovascular Exercise Tolerance: Good negative cardio ROS Normal cardiovascular exam Rhythm:Regular Rate:Normal     Neuro/Psych PSYCHIATRIC DISORDERS Anxiety Depression negative neurological ROS     GI/Hepatic negative GI ROS, Neg liver ROS,   Endo/Other  Obesity   Renal/GU negative Renal ROS   Urgency     Musculoskeletal  (+) Arthritis , Osteoarthritis,    Abdominal   Peds  Hematology negative hematology ROS (+)   Anesthesia Other Findings Day of surgery medications reviewed with the patient.  Left breast cancer  Reproductive/Obstetrics                           Anesthesia Physical Anesthesia Plan  ASA: III  Anesthesia Plan: General   Post-op Pain Management: GA combined w/ Regional for post-op pain   Induction: Intravenous  Airway Management Planned: Oral ETT  Additional Equipment:   Intra-op Plan:   Post-operative Plan: Extubation in OR  Informed Consent: I have reviewed the patients History and Physical, chart, labs and discussed the procedure including the risks, benefits and alternatives for the proposed anesthesia with the patient or authorized representative who has indicated his/her understanding and acceptance.   Dental advisory given  Plan Discussed with: CRNA  Anesthesia Plan Comments: (Risks/benefits of general anesthesia discussed with patient including risk of damage  to teeth, lips, gum, and tongue, nausea/vomiting, allergic reactions to medications, and the possibility of heart attack, stroke and death.  All patient questions answered.  Patient wishes to proceed.  Bilateral PECS blocks in preop.)       Anesthesia Quick Evaluation

## 2016-01-08 NOTE — H&P (Signed)
Christina [name]  Location: [facility] Patient #: [id number] DOB: [birth year] Undefined / Language: Cleophus Molt / Race: White Female   History of Present Illness  The patient is a 50 year old female who presents with breast cancer. Patient is a 50 year old female who presents in consultation at the request of Dr. Inda Merlin for new diagnosis of left breast cancer. The patient had screening detected calcifications seen on mammogram. These were primarily in the subareolar location. There were 11 mm in diameter. She does have other amorphous calcifications which had been consistent with milk of calcium and were more stable. Upper central location of these calcifications was biopsied and showed fibrocystic disease. The total span of the calcifications was 8.5 cm.  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 This was ER and PR positive. She had a paternal grandmother with breast cancer.  Menarche was at age 78. She is a G3 P3 with her first child in her upper teens. She is status post hysterectomy and therefore is no longer having periods.  mammogram FINDINGS: CC and lateral magnification views of the left breast were performed. On the additional views, there is a new 11 mm group of coarse heterogeneous calcifications within the anterior, subareolar left breast. There are extensive additional, predominantly amorphous calcifications within the central left breast, many of which layer on the lateral view consistent with milk of calcium.  IMPRESSION: Indeterminate calcifications within the anterior, subareolar left breast.  RECOMMENDATION: Stereotactic guided left breast biopsy. If biopsy results are benign, six-month follow-up of the remaining left breast calcifications is recommended.  I have discussed the findings and recommendations with the patient. Results were also provided in writing at the conclusion of the visit. If applicable, a reminder letter  will be sent to the patient regarding the next appointment.  BI-RADS CATEGORY 4: Suspicious.   Pathology Breast, left, needle core biopsy, subareolar - DUCTAL CARCINOMA IN SITU WITH CALCIFICATIONS. - SEE COMMENT.  Breast, left, needle core biopsy, upper central - FIBROCYSTIC CHANGES WITH CALCIFICATIONS AND SCLEROSING ADENOSIS. - NO MALIGNANCY IDENTIFIED.  Labs CBC, CMET essentially normal.   Treatment team Dr. Lisbeth Renshaw Dr. Lindi Adie Dr. Inda Merlin Dr. Theda Sers (radiology)    Other Problems  Anxiety Disorder Arthritis Bladder Problems Depression Gastroesophageal Reflux Disease Heart murmur  Past Surgical History Appendectomy Gallbladder Surgery - Laparoscopic Hysterectomy (not due to cancer) - Partial  Diagnostic Studies History Colonoscopy never Mammogram within last year Pap Smear 1-5 years ago  Medication History  No Current Medications Medications Reconciled  Social History Caffeine use Carbonated beverages. No alcohol use No drug use Tobacco use Former smoker.  Family HistoryHeart Disease Father.  Pregnancy / Birth History  Age at menarche 39 years. Contraceptive History Oral contraceptives. Gravida 3 Maternal age 83-20 Para 3    Review of Systems General Present- Fatigue. Not Present- Appetite Loss, Chills, Fever, Night Sweats, Weight Gain and Weight Loss. Skin Present- Dryness. Not Present- Change in Wart/Mole, Hives, Jaundice, New Lesions, Non-Healing Wounds, Rash and Ulcer. HEENT Present- Wears glasses/contact lenses. Not Present- Earache, Hearing Loss, Hoarseness, Nose Bleed, Oral Ulcers, Ringing in the Ears, Seasonal Allergies, Sinus Pain, Sore Throat, Visual Disturbances and Yellow Eyes. Respiratory Present- Snoring. Not Present- Bloody sputum, Chronic Cough, Difficulty Breathing and Wheezing. Breast Not Present- Breast Mass, Breast Pain, Nipple Discharge and Skin Changes. Cardiovascular Present- Swelling of Extremities.  Not Present- Chest Pain, Difficulty Breathing Lying Down, Leg Cramps, Palpitations, Rapid Heart Rate and Shortness of Breath. Gastrointestinal Present- Difficulty Swallowing. Not Present- Abdominal  Pain, Bloating, Bloody Stool, Change in Bowel Habits, Chronic diarrhea, Constipation, Excessive gas, Gets full quickly at meals, Hemorrhoids, Indigestion, Nausea, Rectal Pain and Vomiting. Female Genitourinary Present- Frequency and Urgency. Not Present- Nocturia, Painful Urination and Pelvic Pain. Musculoskeletal Not Present- Back Pain, Joint Pain, Joint Stiffness, Muscle Pain, Muscle Weakness and Swelling of Extremities. Neurological Not Present- Decreased Memory, Fainting, Headaches, Numbness, Seizures, Tingling, Tremor, Trouble walking and Weakness. Psychiatric Present- Depression. Not Present- Anxiety, Bipolar, Change in Sleep Pattern, Fearful and Frequent crying. Endocrine Not Present- Cold Intolerance, Excessive Hunger, Hair Changes, Heat Intolerance, Hot flashes and New Diabetes. Hematology Not Present- Easy Bruising, Excessive bleeding, Gland problems, HIV and Persistent Infections.  Vitals  Weight: 192.7 lb Height: 66in Body Surface Area: 1.97 m Body Mass Index: 31.1 kg/m  Temp.: 97.51F  Pulse: 75 (Regular)  Resp.: 18 (Unlabored)  BP: 130/81 (Sitting, Left Arm, Standard)       Physical Exam General Mental Status-Alert. General Appearance-Consistent with stated age. Hydration-Well hydrated. Voice-Normal.  Head and Neck Head-normocephalic, atraumatic with no lesions or palpable masses. Trachea-midline. Thyroid Gland Characteristics - normal size and consistency.  Eye Eyeball - Bilateral-Extraocular movements intact. Sclera/Conjunctiva - Bilateral-No scleral icterus.  Chest and Lung Exam Chest and lung exam reveals -quiet, even and easy respiratory effort with no use of accessory muscles and on auscultation, normal breath sounds, no  adventitious sounds and normal vocal resonance. Inspection Chest Wall - Normal. Back - normal.  Breast Note: breasts are symmetric bilaterally. They are reasonably desee with minimal ptosis. She has no palpable masses although her lateral left breast does have some bruising from her biopsies. She has no nipple retraction or skin dimpling. There is no lymphadenopathy in either breast.   Cardiovascular Cardiovascular examination reveals -normal heart sounds, regular rate and rhythm with no murmurs and normal pedal pulses bilaterally.  Abdomen Inspection Inspection of the abdomen reveals - No Hernias. Palpation/Percussion Palpation and Percussion of the abdomen reveal - Soft, Non Tender, No Rebound tenderness, No Rigidity (guarding) and No hepatosplenomegaly. Auscultation Auscultation of the abdomen reveals - Bowel sounds normal.  Neurologic Neurologic evaluation reveals -alert and oriented x 3 with no impairment of recent or remote memory. Mental Status-Normal.  Musculoskeletal Global Assessment -Note: no gross deformities.  Normal Exam - Left-Upper Extremity Strength Normal and Lower Extremity Strength Normal. Normal Exam - Right-Upper Extremity Strength Normal and Lower Extremity Strength Normal.  Lymphatic Head & Neck  General Head & Neck Lymphatics: Bilateral - Description - Normal. Axillary  General Axillary Region: Bilateral - Description - Normal. Tenderness - Non Tender. Femoral & Inguinal  Generalized Femoral & Inguinal Lymphatics: Bilateral - Description - No Generalized lymphadenopathy.    Assessment & Plan CARCINOMA OF CENTRAL PORTION OF LEFT FEMALE BREAST (C50.112) Impression: Patient has a new diagnosis of left breast cancer. This is clinical Tis. It is a bit unclear how extensive the diseases. The new calcifications are 11 mm, but the other calcifications been a 0.5 cm. She will receive an MRI to help clarify this. She may require additional  biopsies. Additionally, we will have her see genetics to to her age and family history. After this information is back, we will then set her up for surgery. If she truly has 8.5 cm DCIS, she will need a mastectomy. This would also lead to likely reconstruction. If she does not have that extensive of disease, we would be able to proceed with lumpectomy followed by radiation. Either way, adjuvant chemoprevention would be offered.  I discussed lumpectomy with  the patient. I reviewed the rationale for seed placement. I discussed the risks and benefits. I do not think she needs a lymph node biopsy due to the small size of DCIS. If she were to get a mastectomy, we would plan to do a sentinel node biopsy.  If we do need to change to a mastectomy, I will need to see her back to discuss surgery again. It will take at least 2 weeks for genetic test results come back.  60 min spent in evaluation, examination, counseling, and coordination of care. >50% spent in counseling. BREAST NEOPLASM, TIS (DCIS), LEFT (D05.12)    Signed by Stark Klein, MD

## 2016-01-09 ENCOUNTER — Encounter (HOSPITAL_COMMUNITY)
Admission: RE | Admit: 2016-01-09 | Discharge: 2016-01-09 | Disposition: A | Discharge status: Home / Self Care | Payer: BLUE CROSS/BLUE SHIELD | Source: Ambulatory Visit | Attending: General Surgery | Admitting: General Surgery

## 2016-01-09 ENCOUNTER — Encounter (HOSPITAL_COMMUNITY)
Admission: RE | Disposition: A | Discharge status: Home / Self Care | Payer: Self-pay | Source: Ambulatory Visit | Attending: General Surgery

## 2016-01-09 ENCOUNTER — Ambulatory Visit (HOSPITAL_COMMUNITY): Payer: BLUE CROSS/BLUE SHIELD | Admitting: Anesthesiology

## 2016-01-09 ENCOUNTER — Ambulatory Visit (HOSPITAL_COMMUNITY)
Admission: RE | Admit: 2016-01-09 | Discharge: 2016-01-11 | Disposition: A | Discharge status: Home / Self Care | Payer: BLUE CROSS/BLUE SHIELD | Source: Ambulatory Visit | Attending: General Surgery | Admitting: General Surgery

## 2016-01-09 ENCOUNTER — Encounter (HOSPITAL_COMMUNITY): Payer: Self-pay | Admitting: Anesthesiology

## 2016-01-09 DIAGNOSIS — Z87891 Personal history of nicotine dependence: Secondary | ICD-10-CM | POA: Insufficient documentation

## 2016-01-09 DIAGNOSIS — D241 Benign neoplasm of right breast: Secondary | ICD-10-CM | POA: Diagnosis not present

## 2016-01-09 DIAGNOSIS — Z6830 Body mass index (BMI) 30.0-30.9, adult: Secondary | ICD-10-CM | POA: Diagnosis not present

## 2016-01-09 DIAGNOSIS — E669 Obesity, unspecified: Secondary | ICD-10-CM | POA: Insufficient documentation

## 2016-01-09 DIAGNOSIS — C9012 Plasma cell leukemia in relapse: Secondary | ICD-10-CM | POA: Diagnosis not present

## 2016-01-09 DIAGNOSIS — C50112 Malignant neoplasm of central portion of left female breast: Secondary | ICD-10-CM | POA: Diagnosis not present

## 2016-01-09 DIAGNOSIS — C50912 Malignant neoplasm of unspecified site of left female breast: Secondary | ICD-10-CM | POA: Diagnosis not present

## 2016-01-09 DIAGNOSIS — F419 Anxiety disorder, unspecified: Secondary | ICD-10-CM | POA: Diagnosis not present

## 2016-01-09 DIAGNOSIS — N6012 Diffuse cystic mastopathy of left breast: Secondary | ICD-10-CM | POA: Diagnosis not present

## 2016-01-09 DIAGNOSIS — K219 Gastro-esophageal reflux disease without esophagitis: Secondary | ICD-10-CM | POA: Diagnosis not present

## 2016-01-09 DIAGNOSIS — N6011 Diffuse cystic mastopathy of right breast: Secondary | ICD-10-CM | POA: Diagnosis not present

## 2016-01-09 DIAGNOSIS — G8918 Other acute postprocedural pain: Secondary | ICD-10-CM | POA: Diagnosis not present

## 2016-01-09 DIAGNOSIS — Z90710 Acquired absence of both cervix and uterus: Secondary | ICD-10-CM | POA: Insufficient documentation

## 2016-01-09 DIAGNOSIS — R921 Mammographic calcification found on diagnostic imaging of breast: Secondary | ICD-10-CM | POA: Diagnosis not present

## 2016-01-09 DIAGNOSIS — N6081 Other benign mammary dysplasias of right breast: Secondary | ICD-10-CM | POA: Diagnosis not present

## 2016-01-09 HISTORY — PX: NIPPLE SPARING MASTECTOMY/SENTINAL LYMPH NODE BIOPSY/RECONSTRUCTION/PLACEMENT OF TISSUE EXPANDER: SHX6484

## 2016-01-09 HISTORY — PX: MASTECTOMY: SHX3

## 2016-01-09 HISTORY — PX: BREAST RECONSTRUCTION WITH PLACEMENT OF TISSUE EXPANDER AND FLEX HD (ACELLULAR HYDRATED DERMIS): SHX6295

## 2016-01-09 SURGERY — NIPPLE SPARING MASTECTOMY WITH SENTINAL LYMPH NODE BIOPSY AND  RECONSTRUCTION WITH PLACEMENT OF TISSUE EXPANDER
Anesthesia: General | Site: Breast | Laterality: Bilateral

## 2016-01-09 MED ORDER — METHOCARBAMOL 500 MG PO TABS
500.0000 mg | ORAL_TABLET | Freq: Four times a day (QID) | ORAL | Status: DC | PRN
  Administered 2016-01-09 – 2016-01-10 (×2): 500 mg via ORAL
  Filled 2016-01-09 (×2): qty 1

## 2016-01-09 MED ORDER — LIDOCAINE 2% (20 MG/ML) 5 ML SYRINGE
INTRAMUSCULAR | Status: AC
  Filled 2016-01-09: qty 5

## 2016-01-09 MED ORDER — GLYCOPYRROLATE 0.2 MG/ML IV SOSY
PREFILLED_SYRINGE | INTRAVENOUS | Status: AC
  Filled 2016-01-09: qty 9

## 2016-01-09 MED ORDER — ARTIFICIAL TEARS OP OINT
TOPICAL_OINTMENT | OPHTHALMIC | Status: AC
  Filled 2016-01-09: qty 3.5

## 2016-01-09 MED ORDER — SODIUM CHLORIDE 0.9 % IJ SOLN
INTRAMUSCULAR | Status: AC
  Filled 2016-01-09: qty 20

## 2016-01-09 MED ORDER — 0.9 % SODIUM CHLORIDE (POUR BTL) OPTIME
TOPICAL | Status: DC | PRN
  Administered 2016-01-09 (×2): 1000 mL

## 2016-01-09 MED ORDER — DEXAMETHASONE SODIUM PHOSPHATE 10 MG/ML IJ SOLN
INTRAMUSCULAR | Status: DC | PRN
  Administered 2016-01-09: 8 mg via INTRAVENOUS

## 2016-01-09 MED ORDER — MIDAZOLAM HCL 5 MG/5ML IJ SOLN
INTRAMUSCULAR | Status: DC | PRN
  Administered 2016-01-09: 1 mg via INTRAVENOUS

## 2016-01-09 MED ORDER — SODIUM CHLORIDE 0.9 % IJ SOLN
INTRAMUSCULAR | Status: DC | PRN
  Administered 2016-01-09: 120 mL

## 2016-01-09 MED ORDER — KETOROLAC TROMETHAMINE 30 MG/ML IJ SOLN
INTRAMUSCULAR | Status: AC
  Filled 2016-01-09: qty 1

## 2016-01-09 MED ORDER — LIDOCAINE HCL (CARDIAC) 20 MG/ML IV SOLN
INTRAVENOUS | Status: DC | PRN
  Administered 2016-01-09: 100 mg via INTRAVENOUS

## 2016-01-09 MED ORDER — SCOPOLAMINE 1 MG/3DAYS TD PT72
1.0000 | MEDICATED_PATCH | TRANSDERMAL | Status: DC
  Filled 2016-01-09: qty 1

## 2016-01-09 MED ORDER — DIPHENHYDRAMINE HCL 50 MG/ML IJ SOLN: 12.5000 mg | Freq: Four times a day (QID) | INTRAMUSCULAR | Status: DC | PRN

## 2016-01-09 MED ORDER — CEFAZOLIN SODIUM 1 G IJ SOLR
INTRAMUSCULAR | Status: AC
  Filled 2016-01-09: qty 20

## 2016-01-09 MED ORDER — ROCURONIUM BROMIDE 50 MG/5ML IV SOLN
INTRAVENOUS | Status: AC
  Filled 2016-01-09: qty 2

## 2016-01-09 MED ORDER — SENNA 8.6 MG PO TABS
1.0000 | ORAL_TABLET | Freq: Two times a day (BID) | ORAL | Status: DC
Start: 2016-01-09 — End: 2016-01-11
  Administered 2016-01-09 – 2016-01-11 (×4): 8.6 mg via ORAL
  Filled 2016-01-09 (×4): qty 1

## 2016-01-09 MED ORDER — AMITRIPTYLINE HCL 50 MG PO TABS
100.0000 mg | ORAL_TABLET | Freq: Every day | ORAL | Status: DC
  Administered 2016-01-09 – 2016-01-10 (×2): 100 mg via ORAL
  Filled 2016-01-09 (×2): qty 2

## 2016-01-09 MED ORDER — CEFAZOLIN SODIUM-DEXTROSE 2-4 GM/100ML-% IV SOLN
2.0000 g | Freq: Three times a day (TID) | INTRAVENOUS | Status: AC
  Administered 2016-01-09: 2 g via INTRAVENOUS
  Filled 2016-01-09 (×2): qty 100

## 2016-01-09 MED ORDER — LACTATED RINGERS IV SOLN
INTRAVENOUS | Status: DC | PRN
  Administered 2016-01-09 (×3): via INTRAVENOUS

## 2016-01-09 MED ORDER — PROMETHAZINE HCL 25 MG/ML IJ SOLN
6.2500 mg | INTRAMUSCULAR | Status: DC | PRN
  Administered 2016-01-09: 6.25 mg via INTRAVENOUS
  Filled 2016-01-09 (×2): qty 1

## 2016-01-09 MED ORDER — PROPOFOL 10 MG/ML IV BOLUS
INTRAVENOUS | Status: DC | PRN
  Administered 2016-01-09: 150 mg via INTRAVENOUS

## 2016-01-09 MED ORDER — ROCURONIUM BROMIDE 100 MG/10ML IV SOLN
INTRAVENOUS | Status: DC | PRN
  Administered 2016-01-09: 50 mg via INTRAVENOUS

## 2016-01-09 MED ORDER — KCL IN DEXTROSE-NACL 20-5-0.45 MEQ/L-%-% IV SOLN
INTRAVENOUS | Status: DC
  Administered 2016-01-09 – 2016-01-10 (×2): via INTRAVENOUS
  Filled 2016-01-09 (×2): qty 1000

## 2016-01-09 MED ORDER — ACETAMINOPHEN 650 MG RE SUPP: 650.0000 mg | Freq: Four times a day (QID) | RECTAL | Status: DC | PRN

## 2016-01-09 MED ORDER — ONDANSETRON HCL 4 MG/2ML IJ SOLN
INTRAMUSCULAR | Status: AC
  Filled 2016-01-09: qty 2

## 2016-01-09 MED ORDER — SODIUM CHLORIDE 0.9 % IR SOLN
Status: DC | PRN
  Administered 2016-01-09: 1000 mL

## 2016-01-09 MED ORDER — BUPROPION HCL ER (XL) 150 MG PO TB24
150.0000 mg | ORAL_TABLET | ORAL | Status: DC
  Administered 2016-01-10 – 2016-01-11 (×2): 150 mg via ORAL
  Filled 2016-01-09 (×2): qty 1

## 2016-01-09 MED ORDER — ONDANSETRON HCL 4 MG/2ML IJ SOLN: 4.0000 mg | Freq: Four times a day (QID) | INTRAMUSCULAR | Status: DC | PRN

## 2016-01-09 MED ORDER — KETOROLAC TROMETHAMINE 30 MG/ML IJ SOLN
30.0000 mg | Freq: Four times a day (QID) | INTRAMUSCULAR | Status: DC | PRN
  Administered 2016-01-09: 30 mg via INTRAVENOUS

## 2016-01-09 MED ORDER — SODIUM CHLORIDE 0.9 % IR SOLN
Status: DC | PRN
  Administered 2016-01-09: 500 mL

## 2016-01-09 MED ORDER — MIDAZOLAM HCL 2 MG/2ML IJ SOLN
INTRAMUSCULAR | Status: AC
  Filled 2016-01-09: qty 2

## 2016-01-09 MED ORDER — ARTIFICIAL TEARS OP OINT
TOPICAL_OINTMENT | OPHTHALMIC | Status: DC | PRN
  Administered 2016-01-09: 1 via OPHTHALMIC

## 2016-01-09 MED ORDER — HYDROMORPHONE HCL 1 MG/ML IJ SOLN
0.5000 mg | INTRAMUSCULAR | Status: DC | PRN
  Administered 2016-01-09 – 2016-01-10 (×2): 1 mg via INTRAVENOUS
  Filled 2016-01-09 (×2): qty 1

## 2016-01-09 MED ORDER — ONDANSETRON HCL 4 MG/2ML IJ SOLN
4.0000 mg | Freq: Once | INTRAMUSCULAR | Status: AC | PRN
  Administered 2016-01-09: 4 mg via INTRAVENOUS

## 2016-01-09 MED ORDER — CEFAZOLIN SODIUM-DEXTROSE 2-4 GM/100ML-% IV SOLN
2.0000 g | INTRAVENOUS | Status: AC
  Administered 2016-01-09 (×2): 2 g via INTRAVENOUS
  Filled 2016-01-09: qty 100

## 2016-01-09 MED ORDER — ONDANSETRON 4 MG PO TBDP: 4.0000 mg | ORAL_TABLET | Freq: Four times a day (QID) | ORAL | Status: DC | PRN

## 2016-01-09 MED ORDER — ACETAMINOPHEN 325 MG PO TABS: 650.0000 mg | ORAL_TABLET | Freq: Four times a day (QID) | ORAL | Status: DC | PRN

## 2016-01-09 MED ORDER — DEXAMETHASONE SODIUM PHOSPHATE 10 MG/ML IJ SOLN
INTRAMUSCULAR | Status: AC
  Filled 2016-01-09: qty 1

## 2016-01-09 MED ORDER — FENTANYL CITRATE (PF) 100 MCG/2ML IJ SOLN: 25.0000 ug | INTRAMUSCULAR | Status: DC | PRN

## 2016-01-09 MED ORDER — FENTANYL CITRATE (PF) 250 MCG/5ML IJ SOLN
INTRAMUSCULAR | Status: AC
  Filled 2016-01-09: qty 5

## 2016-01-09 MED ORDER — WHITE PETROLATUM GEL
Status: AC
  Administered 2016-01-09: 18:00:00
  Filled 2016-01-09: qty 1

## 2016-01-09 MED ORDER — DIPHENHYDRAMINE HCL 12.5 MG/5ML PO ELIX: 12.5000 mg | ORAL_SOLUTION | Freq: Four times a day (QID) | ORAL | Status: DC | PRN

## 2016-01-09 MED ORDER — NEOSTIGMINE METHYLSULFATE 5 MG/5ML IV SOSY
PREFILLED_SYRINGE | INTRAVENOUS | Status: AC
  Filled 2016-01-09: qty 5

## 2016-01-09 MED ORDER — GLYCOPYRROLATE 0.2 MG/ML IJ SOLN
INTRAMUSCULAR | Status: DC | PRN
  Administered 2016-01-09: .4 mg via INTRAVENOUS

## 2016-01-09 MED ORDER — KETOROLAC TROMETHAMINE 30 MG/ML IJ SOLN
30.0000 mg | Freq: Four times a day (QID) | INTRAMUSCULAR | Status: AC
  Administered 2016-01-09 – 2016-01-10 (×4): 30 mg via INTRAVENOUS
  Filled 2016-01-09 (×4): qty 1

## 2016-01-09 MED ORDER — TECHNETIUM TC 99M SULFUR COLLOID FILTERED
1.0000 | Freq: Once | INTRAVENOUS | Status: AC | PRN
  Administered 2016-01-09: 1 via INTRADERMAL

## 2016-01-09 MED ORDER — PROPOFOL 10 MG/ML IV BOLUS
INTRAVENOUS | Status: AC
  Filled 2016-01-09: qty 40

## 2016-01-09 MED ORDER — FENTANYL CITRATE (PF) 100 MCG/2ML IJ SOLN
INTRAMUSCULAR | Status: DC | PRN
  Administered 2016-01-09 (×8): 50 ug via INTRAVENOUS

## 2016-01-09 MED ORDER — SIMETHICONE 80 MG PO CHEW: 40.0000 mg | CHEWABLE_TABLET | Freq: Four times a day (QID) | ORAL | Status: DC | PRN

## 2016-01-09 MED ORDER — ONDANSETRON HCL 4 MG/2ML IJ SOLN
INTRAMUSCULAR | Status: DC | PRN
  Administered 2016-01-09: 4 mg via INTRAVENOUS

## 2016-01-09 MED ORDER — OXYCODONE HCL 5 MG PO TABS
5.0000 mg | ORAL_TABLET | ORAL | Status: DC | PRN
  Administered 2016-01-10 – 2016-01-11 (×5): 10 mg via ORAL
  Filled 2016-01-09 (×5): qty 2

## 2016-01-09 MED ORDER — SCOPOLAMINE 1 MG/3DAYS TD PT72
MEDICATED_PATCH | TRANSDERMAL | Status: DC | PRN
  Administered 2016-01-09: 1 via TRANSDERMAL

## 2016-01-09 MED ORDER — NEOSTIGMINE METHYLSULFATE 10 MG/10ML IV SOLN
INTRAVENOUS | Status: DC | PRN
  Administered 2016-01-09: 3 mg via INTRAVENOUS

## 2016-01-09 SURGICAL SUPPLY — 89 items
ATCH SMKEVC FLXB CAUT HNDSWH (FILTER) ×1 IMPLANT
BAG DECANTER FOR FLEXI CONT (MISCELLANEOUS) ×6 IMPLANT
BINDER BREAST XLRG (GAUZE/BANDAGES/DRESSINGS) ×3 IMPLANT
BIOPATCH RED 1 DISK 7.0 (GAUZE/BANDAGES/DRESSINGS) ×4 IMPLANT
BIOPATCH RED 1IN DISK 7.0MM (GAUZE/BANDAGES/DRESSINGS) ×2
BNDG COHESIVE 6X5 TAN STRL LF (GAUZE/BANDAGES/DRESSINGS) ×3 IMPLANT
CANISTER SUCTION 2500CC (MISCELLANEOUS) ×6 IMPLANT
CHLORAPREP W/TINT 26ML (MISCELLANEOUS) ×3 IMPLANT
CLIP TI MEDIUM 24 (CLIP) ×3 IMPLANT
CLIP TI WIDE RED SMALL 24 (CLIP) ×3 IMPLANT
COUNTER NEEDLE 20 DBL MAG RED (NEEDLE) ×6 IMPLANT
COVER MAYO STAND STRL (DRAPES) ×3 IMPLANT
COVER PROBE W GEL 5X96 (DRAPES) ×3 IMPLANT
COVER SURGICAL LIGHT HANDLE (MISCELLANEOUS) ×9 IMPLANT
DERMABOND ADVANCED (GAUZE/BANDAGES/DRESSINGS) ×2
DERMABOND ADVANCED .7 DNX12 (GAUZE/BANDAGES/DRESSINGS) ×1 IMPLANT
DRAIN CHANNEL 19F RND (DRAIN) ×6 IMPLANT
DRAPE ORTHO SPLIT 77X108 STRL (DRAPES) ×4
DRAPE PROXIMA HALF (DRAPES) ×15 IMPLANT
DRAPE SURG 17X23 STRL (DRAPES) ×6 IMPLANT
DRAPE SURG ORHT 6 SPLT 77X108 (DRAPES) ×2 IMPLANT
DRAPE UTILITY XL STRL (DRAPES) ×6 IMPLANT
DRAPE WARM FLUID 44X44 (DRAPE) ×3 IMPLANT
ELECT BLADE 4.0 EZ CLEAN MEGAD (MISCELLANEOUS) ×6
ELECT BLADE 6.5 EXT (BLADE) ×3 IMPLANT
ELECT CAUTERY BLADE 6.4 (BLADE) ×3 IMPLANT
ELECT REM PT RETURN 9FT ADLT (ELECTROSURGICAL) ×3
ELECTRODE BLDE 4.0 EZ CLN MEGD (MISCELLANEOUS) ×2 IMPLANT
ELECTRODE REM PT RTRN 9FT ADLT (ELECTROSURGICAL) ×1 IMPLANT
EVACUATOR SILICONE 100CC (DRAIN) ×6 IMPLANT
EVACUATOR SMOKE ACCUVAC VALLEY (FILTER) ×2
GAUZE SPONGE 4X4 12PLY STRL (GAUZE/BANDAGES/DRESSINGS) ×6 IMPLANT
GLOVE BIO SURGEON STRL SZ 6 (GLOVE) ×3 IMPLANT
GLOVE BIO SURGEON STRL SZ 6.5 (GLOVE) ×4 IMPLANT
GLOVE BIO SURGEONS STRL SZ 6.5 (GLOVE) ×2
GLOVE BIOGEL M 7.0 STRL (GLOVE) ×3 IMPLANT
GLOVE BIOGEL PI IND STRL 6 (GLOVE) ×1 IMPLANT
GLOVE BIOGEL PI IND STRL 6.5 (GLOVE) ×2 IMPLANT
GLOVE BIOGEL PI IND STRL 7.5 (GLOVE) ×1 IMPLANT
GLOVE BIOGEL PI INDICATOR 6 (GLOVE) ×2
GLOVE BIOGEL PI INDICATOR 6.5 (GLOVE) ×4
GLOVE BIOGEL PI INDICATOR 7.5 (GLOVE) ×2
GLOVE ECLIPSE 7.5 STRL STRAW (GLOVE) ×6 IMPLANT
GLOVE SURG SS PI 8.0 STRL IVOR (GLOVE) ×3 IMPLANT
GOWN STRL REUS W/ TWL LRG LVL3 (GOWN DISPOSABLE) ×4 IMPLANT
GOWN STRL REUS W/ TWL XL LVL3 (GOWN DISPOSABLE) ×1 IMPLANT
GOWN STRL REUS W/TWL 2XL LVL3 (GOWN DISPOSABLE) ×3 IMPLANT
GOWN STRL REUS W/TWL LRG LVL3 (GOWN DISPOSABLE) ×8
GOWN STRL REUS W/TWL XL LVL3 (GOWN DISPOSABLE) ×2
GRAFT FLEX HD BILAT 4X16 THICK (Tissue Mesh) ×3 IMPLANT
ILLUMINATOR WAVEGUIDE N/F (MISCELLANEOUS) IMPLANT
IMPL BREAST TIS EXP M 350CC (Breast) ×2 IMPLANT
IMPLANT BREAST TIS EXP M 350CC (Breast) ×6 IMPLANT
KIT BASIN OR (CUSTOM PROCEDURE TRAY) ×6 IMPLANT
KIT ROOM TURNOVER OR (KITS) ×6 IMPLANT
LIGHT WAVEGUIDE WIDE FLAT (MISCELLANEOUS) ×3 IMPLANT
NEEDLE FILTER BLUNT 18X 1/2SAF (NEEDLE)
NEEDLE FILTER BLUNT 18X1 1/2 (NEEDLE) IMPLANT
NEEDLE SPNL 22GX3.5 QUINCKE BK (NEEDLE) ×3 IMPLANT
NS IRRIG 1000ML POUR BTL (IV SOLUTION) ×6 IMPLANT
PACK GENERAL/GYN (CUSTOM PROCEDURE TRAY) ×3 IMPLANT
PAD ABD 8X10 STRL (GAUZE/BANDAGES/DRESSINGS) ×12 IMPLANT
PAD ARMBOARD 7.5X6 YLW CONV (MISCELLANEOUS) ×3 IMPLANT
PIN SAFETY STERILE (MISCELLANEOUS) ×6 IMPLANT
PREFILTER EVAC NS 1 1/3-3/8IN (MISCELLANEOUS) ×3 IMPLANT
PREFILTER SMOKE EVAC (FILTER) ×3 IMPLANT
SET ASEPTIC TRANSFER (MISCELLANEOUS) ×3 IMPLANT
SPONGE LAP 18X18 X RAY DECT (DISPOSABLE) ×18 IMPLANT
STAPLER VISISTAT 35W (STAPLE) ×3 IMPLANT
STOCKINETTE IMPERVIOUS LG (DRAPES) ×3 IMPLANT
SUT ETHILON 2 0 FS 18 (SUTURE) IMPLANT
SUT MNCRL AB 4-0 PS2 18 (SUTURE) ×6 IMPLANT
SUT MON AB 3-0 SH 27 (SUTURE) ×4
SUT MON AB 3-0 SH27 (SUTURE) ×2 IMPLANT
SUT MON AB 4-0 PC3 18 (SUTURE) IMPLANT
SUT MON AB 5-0 PS2 18 (SUTURE) ×6 IMPLANT
SUT PDS AB 2-0 CT1 27 (SUTURE) ×18 IMPLANT
SUT SILK 4 0 PS 2 (SUTURE) ×6 IMPLANT
SUT VIC AB 3-0 PS2 18 (SUTURE) ×2
SUT VIC AB 3-0 PS2 18XBRD (SUTURE) ×1 IMPLANT
SUT VIC AB 3-0 SH 18 (SUTURE) IMPLANT
SUT VIC AB 3-0 SH 8-18 (SUTURE) ×3 IMPLANT
SYR 50ML SLIP (SYRINGE) ×3 IMPLANT
TOWEL OR 17X24 6PK STRL BLUE (TOWEL DISPOSABLE) ×3 IMPLANT
TOWEL OR 17X26 10 PK STRL BLUE (TOWEL DISPOSABLE) ×3 IMPLANT
TRAY FOLEY CATH 14FRSI W/METER (CATHETERS) ×3 IMPLANT
TUBE CONNECTING 12'X1/4 (SUCTIONS) ×1
TUBE CONNECTING 12X1/4 (SUCTIONS) ×2 IMPLANT
YANKAUER SUCT BULB TIP NO VENT (SUCTIONS) ×3 IMPLANT

## 2016-01-09 NOTE — Anesthesia Procedure Notes (Addendum)
Anesthesia Regional Block:  Pectoralis block  Pre-Anesthetic Checklist: ,, timeout performed, Correct Patient, Correct Site, Correct Laterality, Correct Procedure, Correct Position, site marked, Risks and benefits discussed,  Surgical consent,  Pre-op evaluation,  At surgeon's request and post-op pain management  Laterality: Right and Left  Prep: chloraprep       Needles:  Injection technique: Single-shot  Needle Type: Echogenic Needle     Needle Length: 9cm 9 cm Needle Gauge: 21 and 21 G    Additional Needles:  Procedures: ultrasound guided (picture in chart) Pectoralis block Narrative:  Injection made incrementally with aspirations every 5 mL.  Performed by: Personally  Anesthesiologist: Catalina Gravel  Additional Notes: No pain on injection. No increased resistance to injection. Injection made in 5cc increments.  Good needle visualization.  Patient tolerated procedure well.  Right and Left PECS blocks.   Procedure Name: Intubation Date/Time: 01/09/2016 9:00 AM Performed by: Terrill Mohr Pre-anesthesia Checklist: Patient identified, Emergency Drugs available, Suction available and Patient being monitored Patient Re-evaluated:Patient Re-evaluated prior to inductionOxygen Delivery Method: Circle system utilized Preoxygenation: Pre-oxygenation with 100% oxygen Intubation Type: IV induction Ventilation: Mask ventilation without difficulty Laryngoscope Size: Mac and 3 Grade View: Grade II Tube type: Oral Tube size: 7.5 mm Number of attempts: 1 Airway Equipment and Method: Stylet Placement Confirmation: ETT inserted through vocal cords under direct vision,  breath sounds checked- equal and bilateral and positive ETCO2 Secured at: 21 (cm at upper gum) cm Tube secured with: Tape Dental Injury: Teeth and Oropharynx as per pre-operative assessment

## 2016-01-09 NOTE — Transfer of Care (Signed)
Immediate Anesthesia Transfer of Care Note  Patient: Christina [name]  Procedure(s) Performed: Procedure(s): BILATERAL NIPPLE SPARING MASTECTOMY WITH LEFT SENTINAL LYMPH NODE BIOPSY  (Bilateral) BREAST RECONSTRUCTION WITH PLACEMENT OF TISSUE EXPANDER AND FLEX HD (ACELLULAR HYDRATED DERMIS) (Bilateral)  Patient Location: PACU  Anesthesia Type:General  Level of Consciousness: sedated and patient cooperative  Airway & Oxygen Therapy: Patient Spontanous Breathing and Patient connected to nasal cannula oxygen  Post-op Assessment: Report given to RN, Post -op Vital signs reviewed and stable and Patient moving all extremities  Post vital signs: Reviewed and stable  Last Vitals:  Filed Vitals:   01/09/16 0736  BP: 141/78  Pulse: 74  Temp: 36.9 C  Resp: 20    Last Pain: There were no vitals filed for this visit.    Patients Stated Pain Goal: 4 ([id number])  Complications: No apparent anesthesia complications

## 2016-01-09 NOTE — Op Note (Signed)
Bilateral Nipple sparing Mastectomy with left Sentinel Node Biopsy Procedure Note  Indications: This patient presents with history of left breast cancer with clinically negative axillary lymph node exam and genetic predisposition to breast cancer.  Pre-operative Diagnosis: left breast cancer, [id number]  Post-operative Diagnosis: left breast cancer, same  Surgeon: Stark Klein   Anesthesia: General endotracheal anesthesia and Local anesthesia 0.25.% bupivacaine, with epinephrine  ASA Class: 3  Procedure Details  The patient was seen in the Holding Room. The risks, benefits, complications, treatment options, and expected outcomes were discussed with the patient. The possibilities of reaction to medication, pulmonary aspiration, bleeding, infection, the need for additional procedures, failure to diagnose a condition, and creating a complication requiring transfusion or operation were discussed with the patient. The patient concurred with the proposed plan, giving informed consent.  The site of surgery properly noted/marked. The patient was taken to Operating Room # 8, identified as Janeane Jurica and the procedure verified as Bilateral Nipple Sparing Mastectomy and Left Sentinel Node Biopsy. A Time Out was held and the above information confirmed.    After induction of anesthesia, the chest, upper abdomen, and left arm were prepped and draped in standard fashion.   The borders of the breast were identified and marked.  The inframammary incisions of the breast were drawn out to make sure incision lines were equidistant in length.  The right mastectomy was done first.  Mastectomy hooks were used to provide elevation of the skin edges, and the cautery was used to create the mastectomy flaps.  The dissection was taken to the fascia of the pectoralis major.  The penetrating vessels were clipped as needed.  The superior flap was taken medially to the lateral sternal border, superiorly to the inferior border of  the clavicle. The dissection was taken laterally to the border of the latissimus.  The breast was taken off including the pectoralis fascia and the axillary tail marked.  The breast under the nipple was marked.  There was a skin burn in the right axillary area.  This was trimmed and closed.    The left side was addressed similarly.  Additional tissue was taken at the subareolar location.  Using a hand-held gamma probe, axillary sentinel nodes were identified.  Four level 2 axillary sentinel nodes were removed and submitted to pathology.  The findings are below.  The lymphovascular channels were clipped with metal clips.    The sentinel lymph node was performed via the inframammary incision.      The wounds were irrigated.  The patient was turned over to Dr. Marla Roe for reconstruction.    Findings: grossly clear surgical margins  Estimated Blood Loss:  100 mL         Drains: per Dr. Marla Roe                Specimens: right breast, left breast, left subareolar tissue and 4 axillary sentinel nodes, all were hot, cps from [id number]         Complications:  None; patient tolerated the procedure well.         Disposition: PACU - hemodynamically stable.         Condition: stable

## 2016-01-09 NOTE — Interval H&P Note (Signed)
History and Physical Interval Note:  01/09/2016 8:07 AM  Christina [name]  has presented today for surgery, with the diagnosis of LEFT BREAST CANCER, GENTETIC PREDISPOSITION TO BREAST CANCER  The various methods of treatment have been discussed with the patient and family. After consideration of risks, benefits and other options for treatment, the patient has consented to  Procedure(s): BILATERAL NIPPLE SPARING MASTECTOMY WITH LEFT SENTINAL LYMPH NODE BIOPSY  (Bilateral) BREAST RECONSTRUCTION WITH PLACEMENT OF TISSUE EXPANDER AND FLEX HD (ACELLULAR HYDRATED DERMIS) (Bilateral) as a surgical intervention .  The patient's history has been reviewed, patient examined, no change in status, stable for surgery.  I have reviewed the patient's chart and labs.  Questions were answered to the patient's satisfaction.     Wallace Going

## 2016-01-09 NOTE — Brief Op Note (Signed)
01/09/2016  11:58 AM  PATIENT:  Christina [name]  50 y.o. female  PRE-OPERATIVE DIAGNOSIS:  LEFT BREAST CANCER, GENTETIC PREDISPOSITION TO BREAST CANCER  POST-OPERATIVE DIAGNOSIS:  LEFT BREAST CANCER, GENTETIC PREDISPOSITION TO BREAST CANCER  PROCEDURE:  Procedure(s): BILATERAL NIPPLE SPARING MASTECTOMY WITH LEFT SENTINAL LYMPH NODE BIOPSY  (Bilateral) BREAST RECONSTRUCTION WITH PLACEMENT OF TISSUE EXPANDER AND FLEX HD (ACELLULAR HYDRATED DERMIS) (Bilateral)  SURGEON:  Surgeon(s) and Role: Panel 1:    * Stark Klein, MD - Primary  Panel 2:    * Loel Lofty Anais Koenen, DO - Primary  PHYSICIAN ASSISTANT: Shawn Rayburn, PA  ASSISTANTS: none   ANESTHESIA:   general  EBL:  Total I/O In: 1000 [I.V.:1000] Out: 975 [Urine:850; Blood:125]  BLOOD ADMINISTERED:none  DRAINS: (2) Jackson-Pratt drain(s) with closed bulb suction in the breast pocket with one on each side   LOCAL MEDICATIONS USED:  NONE  SPECIMEN:  No Specimen  DISPOSITION OF SPECIMEN:  N/A  COUNTS:  YES  TOURNIQUET:  * No tourniquets in log *  DICTATION: .Dragon Dictation  PLAN OF CARE: admit  PATIENT DISPOSITION:  PACU - hemodynamically stable.   Delay start of Pharmacological VTE agent (>24hrs) due to surgical blood loss or risk of bleeding: no

## 2016-01-09 NOTE — H&P (View-Only) (Signed)
Christina [name] is an 50 y.o. female.   Chief Complaint: breast cancer HPI: The patient is a 50 yrs old wf here for breast reconstruction. She went for a screening mammogram and was found to have calcifications of the LEFT breast in the subareolar location. The upper central location was also positive for calcifications and fibrocystic disease. 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 She is 5 feet 7 inches tall, weighs 189 pounds, Preop = 36 C. Her genetics was positive but she is not sure about which one was positive. She does have her kidneys and is wanting to have bilateral mastectomies. She is in not smoking (quit 2009) and has family support with her friends and kids. She had several surgeries as listed below. All referring reports were reviewed. She has mild ptosis of her breasts. No masses palpated.    Past Medical History  Diagnosis Date  . Arthritis     hands and knees  . Ovarian cyst   . 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 Breast cancer ([place])   . Anxiety     Past Surgical History  Procedure Laterality Date  . Cesarean section    . Cholecystectomy    . Achilles tendon repair    . Tubal ligation    . Appendectomy  [id number]    Early mucocele of appendix, removal of small leiomyoma from pelvis, lysis of adhesions  . Abdominal hysterectomy  05/1996    endometriosis    Family History  Problem Relation Age of Onset  . Heart failure Father   . Prostate cancer Father 32  . Colon polyps Mother     approx 2  . Other Mother     hx HPV and hysterectomy due to precancerous cells  . Other Sister 39    hx of hysterectomy for unspecified reason; still has ovaries  . Other Sister 44    paternal half-sister hx of hysterectomy for unspecified reason; still has ovaries  . Bladder Cancer Maternal Uncle 79    not a smoker  . Kidney failure Maternal Grandmother   . Congestive Heart Failure Maternal Grandmother    . Colon cancer Maternal Grandmother 83  . Lung cancer Maternal Grandfather 1    smoker  . Breast cancer Paternal Grandmother     dx. early 28s; w/ hx of trauma to breast  . Crohn's disease Daughter   . Lung cancer Maternal Uncle 40    smoker   Social History:  reports that she has quit smoking. Her smoking use included Cigarettes. She has a 10 pack-year smoking history. She has never used smokeless tobacco. She reports that she does not drink alcohol or use illicit drugs.  Allergies: No Known Allergies   (Not in a hospital admission)  No results found for this or any previous visit (from the past 48 hour(s)). No results found.  Review of Systems  Constitutional: Negative.   HENT: Negative.   Eyes: Negative.   Respiratory: Negative.   Cardiovascular: Negative.   Gastrointestinal: Negative.   Genitourinary: Negative.   Musculoskeletal: Negative.   Skin: Negative.   Neurological: Negative.   Psychiatric/Behavioral: Negative.     There were no vitals taken for this visit. Physical Exam  Constitutional: She is oriented to person, place, and time. She appears well-developed and well-nourished.  HENT:  Head: Normocephalic and atraumatic.  Eyes: Conjunctivae and EOM are normal. Pupils are equal, round, and reactive to light.  Cardiovascular: Normal  rate.   Respiratory: Effort normal.  GI: Soft.  Neurological: She is alert and oriented to person, place, and time.  Skin: Skin is warm.  Psychiatric: She has a normal mood and affect. Her behavior is normal. Judgment and thought content normal.     Assessment/Plan Once all reconstruction options were presented, a focused discussion was had regarding the patient's suitability for each of these procedures. She would like to have bilateral immediate breast reconstruction with expanders and acellular dermal matrix placed. She is aware the NAC may need to be ressected on the left. This may depend on the path  intraoperatively.  Wallace Going, DO 01/03/2016, 7:48 AM

## 2016-01-09 NOTE — Op Note (Signed)
Op report    DATE OF OPERATION:  01/09/2016  LOCATION: [facility]  SURGICAL DIVISION: Plastic Surgery  PREOPERATIVE DIAGNOSES:  1.Left Breast cancer.    POSTOPERATIVE DIAGNOSES:  1. Left Breast cancer.   PROCEDURE:  1. Bilateral immediate breast reconstruction with placement of Acellular Dermal Matrix and tissue expanders.  SURGEON: Anvitha Hutmacher Sanger Devorah Givhan, DO  ASSISTANT: Shawn Rayburn, PA  ANESTHESIA:  General.   COMPLICATIONS: None.   EXPANDERS: Left - Mentor 350 cc.  250 cc of saline placed Right - Mentor 350 cc. 250 cc of saline placed Acellular Dermal Matrix 4 x 16 cm (two)  INDICATIONS FOR PROCEDURE:  The patient, Christina [name], is a 50 y.o. female born on 02-Sep-1966, is here for  immediate first stage breast reconstruction with placement of bilateral tissue expander and Acellular dermal matrix. MRN: [id number]  CONSENT:  Informed consent was obtained directly from the patient. Risks, benefits and alternatives were fully discussed. Specific risks including but not limited to bleeding, infection, hematoma, seroma, scarring, pain, implant infection, implant extrusion, capsular contracture, asymmetry, wound healing problems, and need for further surgery were all discussed. The patient did have an ample opportunity to have her questions answered to her satisfaction.   DESCRIPTION OF PROCEDURE:  The patient was taken to the operating room by the general surgery team. SCDs were placed and IV antibiotics were given. The patient's chest was prepped and draped in a sterile fashion. A time out was performed and the implants to be used were identified.  Bilateral mastectomies were performed.  Once the general surgery team had completed their portion of the case the patient was rendered to the plastic and reconstructive surgery team.  Right:  The pectoralis major muscle was lifted from the chest wall with release of the lateral edge and lateral inframammary fold.  The  pocket was irrigated with antibiotic solution and hemostasis was achieved with electrocautery.  The ADM was then prepared according to the manufacture guidelines and slits placed to help with postoperative fluid management.  The ADM was then sutured to the inferior and lateral edge of the inframammary fold with 2-0 PDS starting with an interrupted stitch and then a running stitch.  The lateral portion was sutured to with interrupted sutures after the expander was placed.  The expander was prepared according to the manufacture guidelines, the air evacuated and then it was placed under the ADM and pectoralis major muscle.  The inferior and lateral tabs were used to secure the expander to the chest wall with 2-0 PDS.  The drain was placed at the inframammary fold over the ADM and secured to the skin with 4-0 Silk.  Left:  The pectoralis major muscle was lifted from the chest wall with release of the lateral edge and lateral inframammary fold.  The pocket was irrigated with antibiotic solution and hemostasis was achieved with electrocautery.  The ADM was then prepared according to the manufacture guidelines and slits placed to help with postoperative fluid management.  The ADM was then sutured to the inferior and lateral edge of the inframammary fold with 2-0 PDS starting with an interrupted stitch and then a running stitch.  The lateral portion was sutured to with interrupted sutures after the expander was placed.  The expander was prepared according to the manufacture guidelines, the air evacuated and then it was placed under the ADM and pectoralis major muscle.  The inferior and lateral tabs were used to secure the expander to the chest wall with 2-0  PDS.  The drain was placed at the inframammary fold over the ADM and secured to the skin with 4-0 Silk.    The deep layers were closed with 3-0 Vicryl followed by 4-0 Monocryl.  The skin was closed with 5-0 Monocryl and then dermabond was applied.  The ABDs and  breast binder were placed.  The patient tolerated the procedure well and there were no complications.  The patient was allowed to wake from anesthesia and taken to the recovery room in satisfactory condition.

## 2016-01-09 NOTE — Interval H&P Note (Signed)
History and Physical Interval Note:  01/09/2016 8:39 AM  Christina [name]  has presented today for surgery, with the diagnosis of LEFT BREAST CANCER, GENTETIC PREDISPOSITION TO BREAST CANCER  The various methods of treatment have been discussed with the patient and family. After consideration of risks, benefits and other options for treatment, the patient has consented to  Procedure(s): BILATERAL NIPPLE SPARING MASTECTOMY WITH LEFT SENTINAL LYMPH NODE BIOPSY  (Bilateral) BREAST RECONSTRUCTION WITH PLACEMENT OF TISSUE EXPANDER AND FLEX HD (ACELLULAR HYDRATED DERMIS) (Bilateral) as a surgical intervention .  The patient's history has been reviewed, patient examined, no change in status, stable for surgery.  I have reviewed the patient's chart and labs.  Questions were answered to the patient's satisfaction.     Mekhi Lascola

## 2016-01-10 ENCOUNTER — Encounter (HOSPITAL_COMMUNITY): Payer: Self-pay | Admitting: General Surgery

## 2016-01-10 DIAGNOSIS — Z6830 Body mass index (BMI) 30.0-30.9, adult: Secondary | ICD-10-CM | POA: Diagnosis not present

## 2016-01-10 DIAGNOSIS — E669 Obesity, unspecified: Secondary | ICD-10-CM | POA: Diagnosis not present

## 2016-01-10 DIAGNOSIS — C50112 Malignant neoplasm of central portion of left female breast: Secondary | ICD-10-CM | POA: Diagnosis not present

## 2016-01-10 DIAGNOSIS — F419 Anxiety disorder, unspecified: Secondary | ICD-10-CM | POA: Diagnosis not present

## 2016-01-10 DIAGNOSIS — Z90710 Acquired absence of both cervix and uterus: Secondary | ICD-10-CM | POA: Diagnosis not present

## 2016-01-10 DIAGNOSIS — Z87891 Personal history of nicotine dependence: Secondary | ICD-10-CM | POA: Diagnosis not present

## 2016-01-10 LAB — BASIC METABOLIC PANEL
Anion gap: 7 (ref 5–15)
BUN: 9 mg/dL (ref 6–20)
CO2: 22 mmol/L (ref 22–32)
Calcium: 8.5 mg/dL — ABNORMAL LOW (ref 8.9–10.3)
Chloride: 110 mmol/L (ref 101–111)
Creatinine, Ser: 0.69 mg/dL (ref 0.44–1.00)
GFR calc Af Amer: >60 mL/min (ref >60)
GFR calc non Af Amer: >60 mL/min (ref >60)
Glucose, Bld: 120 mg/dL — ABNORMAL HIGH (ref 65–99)
Potassium: 4.2 mmol/L (ref 3.5–5.1)
Sodium: 139 mmol/L (ref 135–145)

## 2016-01-10 LAB — CBC
HCT: 31.8 % — ABNORMAL LOW (ref 36.0–46.0)
Hemoglobin: 10 g/dL — ABNORMAL LOW (ref 12.0–15.0)
MCH: 27.8 pg (ref 26.0–34.0)
MCHC: 31.4 g/dL (ref 30.0–36.0)
MCV: 88.3 fL (ref 78.0–100.0)
Platelets: 265 10*3/uL (ref 150–400)
RBC: 3.6 MIL/uL — ABNORMAL LOW (ref 3.87–5.11)
RDW: 13.9 % (ref 11.5–15.5)
WBC: 15.7 10*3/uL — ABNORMAL HIGH (ref 4.0–10.5)

## 2016-01-10 NOTE — Progress Notes (Signed)
1 Day Post-Op  Subjective: Patient is quite sore.  She has been requiring IV meds for pain control.    Objective: Vital signs in last 24 hours: Temp:  [98.2 F (36.8 C)-98.7 F (37.1 C)] 98.6 F (37 C) (05/04 0457) Pulse Rate:  [73-85] 73 (05/04 0457) Resp:  [18-28] 18 (05/04 0457) BP: (107-141)/(53-87) 107/58 mmHg (05/04 0457) SpO2:  [97 %-100 %] 97 % (05/04 0457) Weight:  [88.905 kg (196 lb)] 88.905 kg (196 lb) (05/03 0755) Last BM Date: 01/08/16  Intake/Output from previous day: 05/03 0701 - 05/04 0700 In: 3250 [P.O.:240; I.V.:2910; IV Piggyback:100] Out: [id number] [Urine:3165; Drains:195; Blood:195] Intake/Output this shift:    General appearance: alert, cooperative and mild distress Resp: breathing comfortably Chest wall: anticipated tenderness bilaterally.  No significant bruising.  Drains serosang.  incisions c/d/i.    Lab Results:   Recent Labs  01/07/16 0821 01/10/16 0632  WBC 8.1 15.7*  HGB 13.6 10.0*  HCT 41.5 31.8*  PLT 258 265   BMET  Recent Labs  01/07/16 0821  NA 140  K 4.2  CL 109  CO2 23  GLUCOSE 128*  BUN 12  CREATININE 0.76  CALCIUM 9.5   PT/INR No results for input(s): LABPROT, INR in the last 72 hours. ABG No results for input(s): PHART, HCO3 in the last 72 hours.  Invalid input(s): PCO2, PO2  Studies/Results: Nm Sentinel Node Inj-no Rpt (breast)  01/09/2016  CLINICAL DATA: left breast cancer Sulfur colloid was injected intradermally by the nuclear medicine technologist for breast cancer sentinel node localization.    Anti-infectives: Anti-infectives    Start     Dose/Rate Route Frequency Ordered Stop   01/09/16 2100  ceFAZolin (ANCEF) IVPB 2g/100 mL premix     2 g 200 mL/hr over 30 Minutes Intravenous Every 8 hours 01/09/16 1734 01/09/16 2235   01/09/16 1328  polymyxin B 500,000 Units, bacitracin 50,000 Units in sodium chloride irrigation 0.9 % 500 mL irrigation  Status:  Discontinued       As needed 01/09/16 1328 01/09/16 1428    01/09/16 0737  ceFAZolin (ANCEF) IVPB 2g/100 mL premix     2 g 200 mL/hr over 30 Minutes Intravenous On call to O.R. 01/09/16 0737 01/09/16 1320      Assessment/Plan: s/p Procedure(s): BILATERAL NIPPLE SPARING MASTECTOMY WITH LEFT SENTINAL LYMPH NODE BIOPSY  (Bilateral) BREAST RECONSTRUCTION WITH PLACEMENT OF TISSUE EXPANDER AND FLEX HD (ACELLULAR HYDRATED DERMIS) (Bilateral) Diet as tolerated.    Probably will need another day due to pain control.     Ambulate   Davonna Ertl 01/10/2016

## 2016-01-10 NOTE — Anesthesia Postprocedure Evaluation (Signed)
Anesthesia Post Note  Patient: Christina [name]  Procedure(s) Performed: Procedure(s) (LRB): BILATERAL NIPPLE SPARING MASTECTOMY WITH LEFT SENTINAL LYMPH NODE BIOPSY  (Bilateral) BREAST RECONSTRUCTION WITH PLACEMENT OF TISSUE EXPANDER AND FLEX HD (ACELLULAR HYDRATED DERMIS) (Bilateral)  Patient location during evaluation: PACU Anesthesia Type: General and Regional Level of consciousness: awake and alert Pain management: pain level controlled Vital Signs Assessment: post-procedure vital signs reviewed and stable Respiratory status: spontaneous breathing, nonlabored ventilation, respiratory function stable and patient connected to nasal cannula oxygen Cardiovascular status: blood pressure returned to baseline and stable Postop Assessment: no signs of nausea or vomiting Anesthetic complications: no    Last Vitals:  Filed Vitals:   01/10/16 0206 01/10/16 0457  BP: 108/53 107/58  Pulse: 80 73  Temp: 36.8 C 37 C  Resp: 18 18    Last Pain:  Filed Vitals:   01/10/16 0542  PainSc: 3                  Catalina Gravel

## 2016-01-11 DIAGNOSIS — C50912 Malignant neoplasm of unspecified site of left female breast: Secondary | ICD-10-CM | POA: Diagnosis not present

## 2016-01-11 DIAGNOSIS — Z6830 Body mass index (BMI) 30.0-30.9, adult: Secondary | ICD-10-CM | POA: Diagnosis not present

## 2016-01-11 DIAGNOSIS — E669 Obesity, unspecified: Secondary | ICD-10-CM | POA: Diagnosis not present

## 2016-01-11 DIAGNOSIS — Z87891 Personal history of nicotine dependence: Secondary | ICD-10-CM | POA: Diagnosis not present

## 2016-01-11 DIAGNOSIS — Z90710 Acquired absence of both cervix and uterus: Secondary | ICD-10-CM | POA: Diagnosis not present

## 2016-01-11 DIAGNOSIS — C50112 Malignant neoplasm of central portion of left female breast: Secondary | ICD-10-CM | POA: Diagnosis not present

## 2016-01-11 DIAGNOSIS — F419 Anxiety disorder, unspecified: Secondary | ICD-10-CM | POA: Diagnosis not present

## 2016-01-11 LAB — BASIC METABOLIC PANEL
Anion gap: 6 (ref 5–15)
BUN: 11 mg/dL (ref 6–20)
CO2: 22 mmol/L (ref 22–32)
Calcium: 9.1 mg/dL (ref 8.9–10.3)
Chloride: 112 mmol/L — ABNORMAL HIGH (ref 101–111)
Creatinine, Ser: 0.76 mg/dL (ref 0.44–1.00)
GFR calc Af Amer: >60 mL/min (ref >60)
GFR calc non Af Amer: >60 mL/min (ref >60)
Glucose, Bld: 116 mg/dL — ABNORMAL HIGH (ref 65–99)
Potassium: 4.1 mmol/L (ref 3.5–5.1)
Sodium: 140 mmol/L (ref 135–145)

## 2016-01-11 LAB — CBC
HCT: 30.5 % — ABNORMAL LOW (ref 36.0–46.0)
Hemoglobin: 10 g/dL — ABNORMAL LOW (ref 12.0–15.0)
MCH: 29.6 pg (ref 26.0–34.0)
MCHC: 32.8 g/dL (ref 30.0–36.0)
MCV: 90.2 fL (ref 78.0–100.0)
Platelets: 238 10*3/uL (ref 150–400)
RBC: 3.38 MIL/uL — ABNORMAL LOW (ref 3.87–5.11)
RDW: 14.4 % (ref 11.5–15.5)
WBC: 11.9 10*3/uL — ABNORMAL HIGH (ref 4.0–10.5)

## 2016-01-11 MED ORDER — DIPHENHYDRAMINE HCL 12.5 MG/5ML PO ELIX: 12.5000 mg | ORAL_SOLUTION | Freq: Four times a day (QID) | ORAL | Status: DC | PRN

## 2016-01-11 NOTE — Progress Notes (Signed)
2 Days Post-Op  Subjective: Feeling much better this morning and ready to go home.  Bilateral breast flaps are viable, slightly ecchymotic, no hematoma, no infection. JP drainage is as expected.   Objective: Vital signs in last 24 hours: Temp:  [98.4 F (36.9 C)-99 F (37.2 C)] 99 F (37.2 C) (05/05 0616) Pulse Rate:  [78-88] 78 (05/05 0616) Resp:  [16-18] 16 (05/05 0616) BP: (106-126)/(57-68) 115/68 mmHg (05/05 0616) SpO2:  [98 %-100 %] 98 % (05/05 0616) Last BM Date: 01/08/16  Intake/Output from previous day: 05/04 0701 - 05/05 0700 In: 2769.3 [P.O.:1270; I.V.:1499.3] Out: 1370 [Urine:900; Drains:470] Intake/Output this shift:    General appearance: alert, cooperative, appears stated age and no distress Resp: clear to auscultation bilaterally Cardio: regular rate and rhythm Bilateral NSM with viable flaps, no hematoma, no signs of infection. JP drainage moderate serosanguinous  Lab Results:   Recent Labs  01/10/16 0632 01/11/16 0609  WBC 15.7* 11.9*  HGB 10.0* 10.0*  HCT 31.8* 30.5*  PLT 265 238   BMET  Recent Labs  01/10/16 0632 01/11/16 0609  NA 139 140  K 4.2 4.1  CL 110 112*  CO2 22 22  GLUCOSE 120* 116*  BUN 9 11  CREATININE 0.69 0.76  CALCIUM 8.5* 9.1   PT/INR No results for input(s): LABPROT, INR in the last 72 hours. ABG No results for input(s): PHART, HCO3 in the last 72 hours.  Invalid input(s): PCO2, PO2  Studies/Results: Nm Sentinel Node Inj-no Rpt (breast)  01/09/2016  CLINICAL DATA: left breast cancer Sulfur colloid was injected intradermally by the nuclear medicine technologist for breast cancer sentinel node localization.    Anti-infectives: Anti-infectives    Start     Dose/Rate Route Frequency Ordered Stop   01/09/16 2100  ceFAZolin (ANCEF) IVPB 2g/100 mL premix     2 g 200 mL/hr over 30 Minutes Intravenous Every 8 hours 01/09/16 1734 01/09/16 2235   01/09/16 1328  polymyxin B 500,000 Units, bacitracin 50,000 Units in sodium  chloride irrigation 0.9 % 500 mL irrigation  Status:  Discontinued       As needed 01/09/16 1328 01/09/16 1428   01/09/16 0737  ceFAZolin (ANCEF) IVPB 2g/100 mL premix     2 g 200 mL/hr over 30 Minutes Intravenous On call to O.R. 01/09/16 0737 01/09/16 1320      Assessment/Plan: s/p Procedure(s): BILATERAL NIPPLE SPARING MASTECTOMY WITH LEFT SENTINAL LYMPH NODE BIOPSY  (Bilateral) BREAST RECONSTRUCTION WITH PLACEMENT OF TISSUE EXPANDER AND FLEX HD (ACELLULAR HYDRATED DERMIS) (Bilateral)  Doing well following bilateral NSM for left breast cancer.  Discharge patient home today.  She has prescriptions already at home and follow up appointments.      Orlandis Sanden,PA-C Plastic Surgery [phone]

## 2016-01-11 NOTE — Progress Notes (Signed)
Patient ID: Christina [name], female   DOB: [birth year], 50 y.o.   MRN: [id number]      [address] [place], [place] [address]    Phone: [phone] FAX: [phone]     Subjective: Labs are stable. Vss.  Afebrile.  Pain controlled. Tolerating POs.   Objective:  Vital signs:  Filed Vitals:   01/10/16 0958 01/10/16 1341 01/10/16 2303 01/11/16 0616  BP: 106/57 126/60 115/58 115/68  Pulse: 79 88 79 78  Temp: 98.5 F (36.9 C) 98.4 F (36.9 C) 98.7 F (37.1 C) 99 F (37.2 C)  TempSrc: Oral Oral Oral Oral  Resp: '18 18 18 16  ' Height:      Weight:      SpO2: 100% 100% 100% 98%    Last BM Date: 01/08/16  Intake/Output   Yesterday:  05/04 0701 - 05/05 0700 In: 9233.0 [P.O.:1270; I.V.:1499.3] Out: 1370 [Urine:900; Drains:470] This shift:  Total I/O In: 100 [P.O.:100] Out: -    Physical Exam: General: Pt awake/alert/oriented x4 in no acute distress    Problem List:   Active Problems:   Breast cancer, left (HCC)    Results:   Labs: Results for orders placed or performed during the hospital encounter of 01/09/16 (from the past 48 hour(s))  CBC     Status: Abnormal   Collection Time: 01/10/16  6:32 AM  Result Value Ref Range   WBC 15.7 (H) 4.0 - 10.5 K/uL   RBC 3.60 (L) 3.87 - 5.11 MIL/uL   Hemoglobin 10.0 (L) 12.0 - 15.0 g/dL   HCT 31.8 (L) 36.0 - 46.0 %   MCV 88.3 78.0 - 100.0 fL   MCH 27.8 26.0 - 34.0 pg   MCHC 31.4 30.0 - 36.0 g/dL   RDW 13.9 11.5 - 15.5 %   Platelets 265 150 - 400 K/uL  Basic metabolic panel     Status: Abnormal   Collection Time: 01/10/16  6:32 AM  Result Value Ref Range   Sodium 139 135 - 145 mmol/L   Potassium 4.2 3.5 - 5.1 mmol/L   Chloride 110 101 - 111 mmol/L   CO2 22 22 - 32 mmol/L   Glucose, Bld 120 (H) 65 - 99 mg/dL   BUN 9 6 - 20 mg/dL   Creatinine, Ser 0.69 0.44 - 1.00 mg/dL   Calcium 8.5 (L) 8.9 - 10.3 mg/dL   GFR calc non Af Amer >60 >60 mL/min   GFR calc Af Amer >60 >60 mL/min    Comment: (NOTE) The eGFR has been calculated using the CKD EPI equation. This calculation has not been validated in all clinical situations. eGFR's persistently <60 mL/min signify possible Chronic Kidney Disease.    Anion gap 7 5 - 15  CBC     Status: Abnormal   Collection Time: 01/11/16  6:09 AM  Result Value Ref Range   WBC 11.9 (H) 4.0 - 10.5 K/uL   RBC 3.38 (L) 3.87 - 5.11 MIL/uL   Hemoglobin 10.0 (L) 12.0 - 15.0 g/dL   HCT 30.5 (L) 36.0 - 46.0 %   MCV 90.2 78.0 - 100.0 fL   MCH 29.6 26.0 - 34.0 pg   MCHC 32.8 30.0 - 36.0 g/dL   RDW 14.4 11.5 - 15.5 %   Platelets 238 150 - 400 K/uL  Basic metabolic panel     Status: Abnormal   Collection Time: 01/11/16  6:09 AM  Result Value Ref  Range   Sodium 140 135 - 145 mmol/L   Potassium 4.1 3.5 - 5.1 mmol/L   Chloride 112 (H) 101 - 111 mmol/L   CO2 22 22 - 32 mmol/L   Glucose, Bld 116 (H) 65 - 99 mg/dL   BUN 11 6 - 20 mg/dL   Creatinine, Ser 0.76 0.44 - 1.00 mg/dL   Calcium 9.1 8.9 - 10.3 mg/dL   GFR calc non Af Amer >60 >60 mL/min   GFR calc Af Amer >60 >60 mL/min    Comment: (NOTE) The eGFR has been calculated using the CKD EPI equation. This calculation has not been validated in all clinical situations. eGFR's persistently <60 mL/min signify possible Chronic Kidney Disease.    Anion gap 6 5 - 15    Imaging / Studies: No results found.  Medications / Allergies:  Scheduled Meds: . amitriptyline  100 mg Oral QHS  . buPROPion  150 mg Oral [id number]  . senna  1 tablet Oral BID   Continuous Infusions: . dextrose 5 % and 0.45 % NaCl with KCl 20 mEq/L 100 mL/hr at 01/10/16 0434   PRN Meds:.acetaminophen **OR** acetaminophen, diphenhydrAMINE **OR** diphenhydrAMINE, HYDROmorphone (DILAUDID) injection, [COMPLETED] ketorolac **FOLLOWED BY** ketorolac, methocarbamol, ondansetron **OR** ondansetron (ZOFRAN) IV, oxyCODONE, promethazine, simethicone  Antibiotics: Anti-infectives    Start      Dose/Rate Route Frequency Ordered Stop   01/09/16 2100  ceFAZolin (ANCEF) IVPB 2g/100 mL premix     2 g 200 mL/hr over 30 Minutes Intravenous Every 8 hours 01/09/16 1734 01/09/16 2235   01/09/16 1328  polymyxin B 500,000 Units, bacitracin 50,000 Units in sodium chloride irrigation 0.9 % 500 mL irrigation  Status:  Discontinued       As needed 01/09/16 1328 01/09/16 1428   01/09/16 0737  ceFAZolin (ANCEF) IVPB 2g/100 mL premix     2 g 200 mL/hr over 30 Minutes Intravenous On call to O.R. 01/09/16 0737 01/09/16 1320        Assessment/Plan POD#2 bilatetal nipple sparing mastectomy with left sentinel LNB, breast reconstruction with placement of soft tissue expander--Dr. Byerly/Dillingham  Stable for DC Follow up arranged.   Erby Pian, [facility] Surgery Pager [phone]) For consults and floor pages call [phone](7A-4:30P)  01/11/2016 12:19 PM

## 2016-01-11 NOTE — Discharge Instructions (Signed)
Wear breast binder except for hygiene  Record drain amounts  Up walking as much as tolerable  No lifting more than 5 lbs.

## 2016-01-11 NOTE — Discharge Summary (Signed)
Physician Discharge Summary  Patient ID: Christina [name] MRN: [id number] DOB/AGE: 1965-10-15 50 y.o.  Admit date: 01/09/2016 Discharge date: 01/11/2016  Admission Diagnoses: 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, left  Discharge Diagnoses:  Active Problems:   Breast cancer, left [facility])   Discharged Condition: good  Hospital Course: IMOJEAN MUNDORF is a 50 yo female diagnosed with left breast cancer, [id number],  who was admitted for bilateral nipple sparing mastectomies and sentinel node biopsy on the left followed by immediate bilateral breast reconstruction with tissue expanders and ADM placement. Pathology is pending at this time. She did well following the procedure and is medically stable for discharge.   Consults: None  Treatments: surgery: Bilateral nipple sparing mastectomies with left sentinel node biopsy x 4 for left breast cancer with immediate bilateral reconstruction with tissue expanders and ADM.   Discharge Exam: Blood pressure 115/68, pulse 78, temperature 99 F (37.2 C), temperature source Oral, resp. rate 16, height 5' 7.5" (1.715 m), weight 88.905 kg (196 lb), SpO2 98 %. General appearance: alert, cooperative, appears stated age and no distress Resp: clear to auscultation bilaterally Cardio: regular rate and rhythm bilateral breast flaps are viable and without signs of infection or hematoma. JP drainage serosanguinous  Disposition: 01-Home or Self Care     Medication List    TAKE these medications        amitriptyline 100 MG tablet  Commonly known as:  ELAVIL  Take 100 mg by mouth at bedtime.     b complex vitamins tablet  Take 1 tablet by mouth daily.     buPROPion 150 MG 24 hr tablet  Commonly known as:  WELLBUTRIN XL  Take 150 mg by mouth every morning.     diphenhydrAMINE 12.5 MG/5ML elixir  Commonly known as:  BENADRYL  Take 5 mLs (12.5 mg total) by mouth every 6 (six) hours as needed for itching.     multivitamin tablet  Take 1 tablet by  mouth daily.     vitamin C 1000 MG tablet  Take 1,000 mg by mouth daily.           Follow-up Information    Follow up with Wallace Going, DO In 1 week.   Specialty:  Plastic Surgery   Contact information:   [place] [address] [phone]       Follow up with [facility], MD In 2 weeks.   Specialty:  General Surgery   Contact information:   [address] [place] [address] [phone]       Follow up with Wallace Going, DO In 1 week.   Specialty:  Plastic Surgery   Why:  follow up next week   Contact information:   [place] [address] [phone]       Signed: [facility] [phone]

## 2016-01-11 NOTE — Discharge Planning (Signed)
AVS reviewed and given to patient who has her meds at home already per PA. D'cd per w/c at 18 to private car home with all personal belongings accompanied by Mother.

## 2016-01-16 NOTE — Progress Notes (Signed)
Quick Note:  Please let patient know margins are negative, opposite breast had no cancer, and lymph nodes are negative. ______

## 2016-01-18 ENCOUNTER — Telehealth: Payer: Self-pay | Admitting: Hematology and Oncology

## 2016-01-18 ENCOUNTER — Ambulatory Visit (HOSPITAL_BASED_OUTPATIENT_CLINIC_OR_DEPARTMENT_OTHER): Payer: BLUE CROSS/BLUE SHIELD | Admitting: Hematology and Oncology

## 2016-01-18 ENCOUNTER — Encounter: Payer: Self-pay | Admitting: Hematology and Oncology

## 2016-01-18 VITALS — BP 132/78 | HR 98 | Temp 98.3°F | Resp 18 | Ht 67.5 in | Wt 194.2 lb

## 2016-01-18 DIAGNOSIS — C50112 Malignant neoplasm of central portion of left female breast: Secondary | ICD-10-CM

## 2016-01-18 NOTE — Progress Notes (Signed)
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, left   Staging form: Breast, AJCC 7th Edition     Clinical stage from 11/07/2015: Stage 0 (Tis (DCIS), N0, M0) - Unsigned       Staging comments: Staged in breast conference on 3.1.17  SUMMARY OF ONCOLOGIC HISTORY:   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, left   10/29/2015 Mammogram Screening detected left breast calcifications of irregular 11 mm biopsy DCIS high-grade, central calcifications biopsy was fibrocystic change   10/29/2015 Initial Diagnosis Left breast biopsy subareolar: DCIS with calcifications, ER 95%, PR 90%, high-grade   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 She is recoveringwas reasonably well from the surgery and is not necessarily needing any pain medications.  REVIEW OF SYSTEMS:   Constitutional: Denies fevers, chills or abnormal weight loss Eyes: Denies blurriness of vision Ears, nose, mouth, throat, and face: Denies mucositis or sore throat Respiratory: Denies cough, dyspnea or wheezes Cardiovascular: Denies palpitation, chest discomfort Gastrointestinal:  Denies nausea, heartburn or change in bowel  habits Skin: Denies abnormal skin rashes Lymphatics: Denies new lymphadenopathy or easy bruising Neurological:Denies numbness, tingling or new weaknesses Behavioral/Psych: Mood is stable, no new changes  Extremities: No lower extremity edema Breast:  recent bilateral mastectomies All other systems were reviewed with the patient and are negative.  I have reviewed the past medical history, past surgical history, social history and family history with the patient and they are unchanged from previous note.  ALLERGIES:  has No Known Allergies.  MEDICATIONS:  Current Outpatient Prescriptions  Medication Sig Dispense Refill  . amitriptyline (ELAVIL) 100 MG tablet Take 100 mg by mouth at bedtime.    . Ascorbic Acid (VITAMIN C) 1000 MG tablet Take 1,000 mg by mouth daily.    Marland Kitchen b complex vitamins tablet Take 1 tablet by mouth daily.    Marland Kitchen buPROPion (WELLBUTRIN XL) 150 MG 24 hr tablet Take 150 mg by mouth every morning.    . diphenhydrAMINE (BENADRYL) 12.5 MG/5ML elixir Take 5 mLs (12.5 mg total) by mouth every 6 (six) hours as needed for itching. 120 mL 0  . Multiple Vitamin (MULTIVITAMIN) tablet Take 1 tablet by mouth daily.     No current facility-administered medications for this visit.    PHYSICAL EXAMINATION: ECOG PERFORMANCE STATUS: 1 - Symptomatic but completely ambulatory  Filed Vitals:   01/18/16 0948  BP: 132/78  Pulse: 98  Temp: 98.3 F (36.8 C)  Resp: 18   Filed Weights   01/18/16 0948  Weight: 194 lb 3.2 oz (88.089 kg)    GENERAL:alert, no distress and comfortable SKIN: skin color, texture, turgor are normal, no rashes or significant lesions EYES: normal, Conjunctiva are pink and non-injected, sclera clear OROPHARYNX:no exudate, no erythema and lips,  buccal mucosa, and tongue normal  NECK: supple, thyroid normal size, non-tender, without nodularity LYMPH:  no palpable lymphadenopathy in the cervical, axillary or inguinal LUNGS: clear to auscultation and percussion  with normal breathing effort HEART: regular rate & rhythm and no murmurs and no lower extremity edema ABDOMEN:abdomen soft, non-tender and normal bowel sounds MUSCULOSKELETAL:no cyanosis of digits and no clubbing  NEURO: alert & oriented x 3 with fluent speech, no focal motor/sensory deficits EXTREMITIES: No lower extremity edema  LABORATORY DATA:  I have reviewed the data as listed   Chemistry      Component Value Date/Time   NA 140 01/11/2016 0609   NA 142 11/07/2015 0839   K 4.1 01/11/2016 0609   K 4.7 11/07/2015 0839   CL 112* 01/11/2016 0609   CO2 22 01/11/2016 0609   CO2 26 11/07/2015 0839   BUN 11 01/11/2016 0609   BUN 12.2 11/07/2015 0839   CREATININE 0.76 01/11/2016 0609   CREATININE 0.9 11/07/2015 0839      Component Value Date/Time   CALCIUM 9.1 01/11/2016 0609   CALCIUM 9.9 11/07/2015 0839   ALKPHOS 49 11/07/2015 0839   ALKPHOS 50 07/15/2014 1850   AST 15 11/07/2015 0839   AST 15 07/15/2014 1850   ALT 16 11/07/2015 0839   ALT 14 07/15/2014 1850   BILITOT 0.37 11/07/2015 0839   BILITOT <0.2* 07/15/2014 1850       Lab Results  Component Value Date   WBC 11.9* 01/11/2016   HGB 10.0* 01/11/2016   HCT 30.5* 01/11/2016   MCV 90.2 01/11/2016   PLT 238 01/11/2016   NEUTROABS 3.4 11/07/2015   ASSESSMENT & PLAN:  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, left left mastectomy 01/09/2016: Multifocal IDC 0.6cm (grade 2), 0.4, 0.3 cm (grade 1), IG-DCIS and sep focus of HG DCIS; right mastectomy:PASH, 0/4 Lt Axill LN Neg ER/PR HER-2 are being retested Previously E 95%, PR 90% ATM Mutation (risk of breast, colon, pancreatic cancers)  Pathology review: I discussed the final pathology report including the fact that she does have invasive breast cancer. Staging was also discussed. Her weight for the results of ER/PR and HER-2/neu testing.  Recommendation: 1. Oncotype DX testing to determine if she would benefit from systemic chemotherapy 2. No role of adjuvant  radiation therapy since she had mastectomies 3. Adjuvant antiestrogen therapy with tamoxifen 5 years to decrease the likelihood of distant recurrence. I will call the patient when the results of Oncotype DX testing. If she is low risk that I will call a prescription for tamoxifen. Return to clinic in 4 months for follow-up or sooner if she needs chemotherapy.  No orders of the defined types were placed in this encounter.   The patient has a good understanding of the overall plan. she agrees with it. she will call with any problems that may develop before the next visit here.   Rulon Eisenmenger, MD 01/18/2016

## 2016-01-18 NOTE — Telephone Encounter (Signed)
appt made and avs printed °

## 2016-01-18 NOTE — Assessment & Plan Note (Addendum)
left mastectomy 01/09/2016: Multifocal IDC 0.6cm (grade 2), 0.4, 0.3 cm (grade 1), IG-DCIS and sep focus of HG DCIS; right mastectomy:PASH, 0/4 Lt Axill LN Neg ER/PR HER-2 are being retested Previously E 95%, PR 90% ATM Mutation (risk of breast, colon, pancreatic cancers)  Pathology review: I discussed the final pathology report including the fact that she does have invasive breast cancer. Staging was also discussed. Her weight for the results of ER/PR and HER-2/neu testing.  Recommendation: 1. Oncotype DX testing to determine if she would benefit from systemic chemotherapy 2. No role of adjuvant radiation therapy since she had mastectomies 3. Adjuvant antiestrogen therapy with tamoxifen 5 years to decrease the likelihood of distant recurrence. 

## 2016-01-21 ENCOUNTER — Telehealth: Payer: Self-pay | Admitting: *Deleted

## 2016-01-21 NOTE — Telephone Encounter (Signed)
Received order per Dr. Lindi Adie for Oncotype testing. Requisition sent to pathology. Received by Varney Biles. PAC sent to [facility]

## 2016-01-22 NOTE — Progress Notes (Signed)
Received VM from pt requesting HER-2 test results.  I spoke with pt to inform her that these results take about 14 days for results to come back.  I also instructed patient that we would contact her with these results once obtained.  Pt verbalized understanding and no further questions at time of call.

## 2016-01-27 ENCOUNTER — Encounter (HOSPITAL_COMMUNITY): Payer: Self-pay

## 2016-01-27 ENCOUNTER — Emergency Department (HOSPITAL_COMMUNITY): Payer: BLUE CROSS/BLUE SHIELD

## 2016-01-27 ENCOUNTER — Emergency Department (HOSPITAL_COMMUNITY)
Admission: EM | Admit: 2016-01-27 | Discharge: 2016-01-28 | Disposition: A | Discharge status: Home / Self Care | Payer: BLUE CROSS/BLUE SHIELD | Attending: Emergency Medicine | Admitting: Emergency Medicine

## 2016-01-27 DIAGNOSIS — Z79899 Other long term (current) drug therapy: Secondary | ICD-10-CM | POA: Diagnosis not present

## 2016-01-27 DIAGNOSIS — M1990 Unspecified osteoarthritis, unspecified site: Secondary | ICD-10-CM | POA: Diagnosis not present

## 2016-01-27 DIAGNOSIS — R5081 Fever presenting with conditions classified elsewhere: Secondary | ICD-10-CM | POA: Diagnosis not present

## 2016-01-27 DIAGNOSIS — Z862 Personal history of diseases of the blood and blood-forming organs and certain disorders involving the immune mechanism: Secondary | ICD-10-CM | POA: Diagnosis not present

## 2016-01-27 DIAGNOSIS — Z87891 Personal history of nicotine dependence: Secondary | ICD-10-CM | POA: Diagnosis not present

## 2016-01-27 DIAGNOSIS — F419 Anxiety disorder, unspecified: Secondary | ICD-10-CM | POA: Insufficient documentation

## 2016-01-27 DIAGNOSIS — R000 Tachycardia, unspecified: Secondary | ICD-10-CM | POA: Insufficient documentation

## 2016-01-27 DIAGNOSIS — Z853 Personal history of malignant neoplasm of breast: Secondary | ICD-10-CM | POA: Diagnosis not present

## 2016-01-27 DIAGNOSIS — L03313 Cellulitis of chest wall: Secondary | ICD-10-CM | POA: Diagnosis not present

## 2016-01-27 DIAGNOSIS — Z8719 Personal history of other diseases of the digestive system: Secondary | ICD-10-CM | POA: Insufficient documentation

## 2016-01-27 DIAGNOSIS — L539 Erythematous condition, unspecified: Secondary | ICD-10-CM | POA: Insufficient documentation

## 2016-01-27 DIAGNOSIS — R509 Fever, unspecified: Secondary | ICD-10-CM

## 2016-01-27 DIAGNOSIS — Z0389 Encounter for observation for other suspected diseases and conditions ruled out: Secondary | ICD-10-CM | POA: Diagnosis not present

## 2016-01-27 DIAGNOSIS — L03818 Cellulitis of other sites: Secondary | ICD-10-CM

## 2016-01-27 DIAGNOSIS — F329 Major depressive disorder, single episode, unspecified: Secondary | ICD-10-CM | POA: Diagnosis not present

## 2016-01-27 LAB — URINALYSIS, ROUTINE W REFLEX MICROSCOPIC
Bilirubin Urine: NEGATIVE
Glucose, UA: NEGATIVE mg/dL
Hgb urine dipstick: NEGATIVE
Ketones, ur: NEGATIVE mg/dL
Leukocytes, UA: NEGATIVE
Nitrite: NEGATIVE
Protein, ur: NEGATIVE mg/dL
Specific Gravity, Urine: 1.016 (ref 1.005–1.030)
pH: 7.5 (ref 5.0–8.0)

## 2016-01-27 LAB — COMPREHENSIVE METABOLIC PANEL
ALT: 18 U/L (ref 14–54)
AST: 16 U/L (ref 15–41)
Albumin: 3.4 g/dL — ABNORMAL LOW (ref 3.5–5.0)
Alkaline Phosphatase: 54 U/L (ref 38–126)
Anion gap: 7 (ref 5–15)
BUN: 11 mg/dL (ref 6–20)
CO2: 22 mmol/L (ref 22–32)
Calcium: 9.6 mg/dL (ref 8.9–10.3)
Chloride: 107 mmol/L (ref 101–111)
Creatinine, Ser: 0.86 mg/dL (ref 0.44–1.00)
GFR calc Af Amer: >60 mL/min (ref >60)
GFR calc non Af Amer: >60 mL/min (ref >60)
Glucose, Bld: 120 mg/dL — ABNORMAL HIGH (ref 65–99)
Potassium: 3.9 mmol/L (ref 3.5–5.1)
Sodium: 136 mmol/L (ref 135–145)
Total Bilirubin: 0.4 mg/dL (ref 0.3–1.2)
Total Protein: 6.7 g/dL (ref 6.5–8.1)

## 2016-01-27 LAB — CBC WITH DIFFERENTIAL/PLATELET
Basophils Absolute: 0 10*3/uL (ref 0.0–0.1)
Basophils Relative: 0 %
Eosinophils Absolute: 0.2 10*3/uL (ref 0.0–0.7)
Eosinophils Relative: 1 %
HCT: 37 % (ref 36.0–46.0)
Hemoglobin: 11.8 g/dL — ABNORMAL LOW (ref 12.0–15.0)
Lymphocytes Relative: 11 %
Lymphs Abs: 1.6 10*3/uL (ref 0.7–4.0)
MCH: 27.7 pg (ref 26.0–34.0)
MCHC: 31.9 g/dL (ref 30.0–36.0)
MCV: 86.9 fL (ref 78.0–100.0)
Monocytes Absolute: 1.3 10*3/uL — ABNORMAL HIGH (ref 0.1–1.0)
Monocytes Relative: 9 %
Neutro Abs: 11.2 10*3/uL — ABNORMAL HIGH (ref 1.7–7.7)
Neutrophils Relative %: 79 %
Platelets: 376 10*3/uL (ref 150–400)
RBC: 4.26 MIL/uL (ref 3.87–5.11)
RDW: 14 % (ref 11.5–15.5)
WBC: 14.2 10*3/uL — ABNORMAL HIGH (ref 4.0–10.5)

## 2016-01-27 LAB — I-STAT CG4 LACTIC ACID, ED: Lactic Acid, Venous: 1.59 mmol/L (ref 0.5–2.0)

## 2016-01-27 MED ORDER — SODIUM CHLORIDE 0.9 % IV BOLUS (SEPSIS)
1000.0000 mL | Freq: Once | INTRAVENOUS | Status: AC
  Administered 2016-01-28: 1000 mL via INTRAVENOUS

## 2016-01-27 MED ORDER — IBUPROFEN 200 MG PO TABS
600.0000 mg | ORAL_TABLET | Freq: Once | ORAL | Status: AC
  Administered 2016-01-28: 600 mg via ORAL
  Filled 2016-01-27: qty 3

## 2016-01-27 NOTE — ED Notes (Signed)
Patient complains of a fever of 104 at home and took tylenol 650mg .  Fever 102 in triage warm to the touch.  Had a double mastectomy on may 3rd.  Incision sites appear to have slight purulent drainage. Tachycardic in triage. Patient A&Ox4

## 2016-01-27 NOTE — ED Provider Notes (Signed)
CSN: [id number]     Arrival date & time 01/27/16  2211 History  By signing my name below, I, Rowan Blase, attest that this documentation has been prepared under the direction and in the presence of Merryl Hacker, MD . Electronically Signed: Rowan Blase, Scribe. 01/27/2016. 11:21 PM.   Chief Complaint  Patient presents with  . Fever    recent surgery   The history is provided by the patient. No language interpreter was used.   HPI Comments:  Christina [name] is a 50 y.o. female with PMHx of breast cancer who presents to the Emergency Department complaining of fever measured 104.6 at home ~4 hours ago. Pt had a double mastectomy 2.5 weeks ago. She reports associated headache, and intermittent throbbing pain and increasing redness to incision site of her right breast for the past few days. She also notes green drainage from the incision earlier today. Pt took 650 mg Tylenol PTA without relief. Pt is not currently in chemotherapy. Pt called her surgeon Dr. Eldridge Abrahams earlier tonight and was referred to the ED; she has a follow-up appointment with the surgeon tomorrow. Denies any pain currently, cough, sore throat, congestion, neck stiffness, diarrhea, vomiting, back pain, or urinary symptoms.  Past Medical History  Diagnosis Date  . Arthritis     hands and knees  . Ovarian cyst     "they come and go" (01/09/2016)  . Anxiety   . PONV (postoperative nausea and vomiting)   . Depression   . Urgency of urination   . 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 GERD (gastroesophageal reflux disease)    Past Surgical History  Procedure Laterality Date  . Cesarean section  1987; 1989; 1991  . Achilles tendon repair Right 2010  . Appendectomy  [id number]    Early mucocele of appendix, removal of small leiomyoma from pelvis, lysis of adhesions  . Mastectomy Bilateral 01/09/2016  . Breast reconstruction with placement of tissue expander and flex hd (acellular hydrated dermis)  Bilateral 01/09/2016  . Laparoscopic cholecystectomy  ~ 1999  . Breast biopsy Left 10/2015  . Abdominal hysterectomy  05/1996    endometriosis  . Tubal ligation  1991  . Nipple sparing mastectomy/sentinal lymph node biopsy/reconstruction/placement of tissue expander Bilateral 01/09/2016    Procedure: BILATERAL NIPPLE SPARING MASTECTOMY WITH LEFT SENTINAL LYMPH NODE BIOPSY ;  Surgeon: Stark Klein, MD;  Location: [place];  Service: General;  Laterality: Bilateral;  . Breast reconstruction with placement of tissue expander and flex hd (acellular hydrated dermis) Bilateral 01/09/2016    Procedure: BREAST RECONSTRUCTION WITH PLACEMENT OF TISSUE EXPANDER AND FLEX HD (ACELLULAR HYDRATED DERMIS);  Surgeon: Loel Lofty Dillingham, DO;  Location: [place];  Service: Plastics;  Laterality: Bilateral;   Family History  Problem Relation Age of Onset  . Heart failure Father   . Prostate cancer Father 78  . Colon polyps Mother     approx 2  . Other Mother     hx HPV and hysterectomy due to precancerous cells  . Other Sister 66    hx of hysterectomy for unspecified reason; still has ovaries  . Other Sister 32    paternal half-sister hx of hysterectomy for unspecified reason; still has ovaries  . Bladder Cancer Maternal Uncle 79    not a smoker  . Kidney failure Maternal Grandmother   . Congestive Heart Failure Maternal Grandmother   . Colon cancer Maternal Grandmother 17  . Lung cancer Maternal Grandfather 59  smoker  . Breast cancer Paternal Grandmother     dx. early 35s; w/ hx of trauma to breast  . Crohn's disease Daughter   . Lung cancer Maternal Uncle 52    smoker   Social History  Substance Use Topics  . Smoking status: Former Smoker -- 1.00 packs/day for 10 years    Types: Cigarettes    Quit date: 06/08/2008  . Smokeless tobacco: Never Used  . Alcohol Use: No   OB History    Gravida Para Term Preterm AB TAB SAB Ectopic Multiple Living   4 3        3      Review of Systems  Constitutional:  Positive for fever.  HENT: Negative for sore throat.   Respiratory: Negative for cough and shortness of breath.   Cardiovascular: Negative for chest pain.  Gastrointestinal: Negative for nausea, vomiting, abdominal pain and diarrhea.  Genitourinary: Negative for dysuria and flank pain.  Skin: Positive for color change. Negative for wound.  Neurological: Positive for headaches.  All other systems reviewed and are negative.   Allergies  Review of patient's allergies indicates no known allergies.  Home Medications   Prior to Admission medications   Medication Sig Start Date End Date Taking? Authorizing Provider  amitriptyline (ELAVIL) 100 MG tablet Take 100 mg by mouth at bedtime.   Yes Historical Provider, MD  Ascorbic Acid (VITAMIN C) 1000 MG tablet Take 1,000 mg by mouth daily.   Yes Historical Provider, MD  b complex vitamins tablet Take 1 tablet by mouth daily.   Yes Historical Provider, MD  buPROPion (WELLBUTRIN XL) 150 MG 24 hr tablet Take 150 mg by mouth every morning.   Yes Historical Provider, MD  Multiple Vitamin (MULTIVITAMIN) tablet Take 1 tablet by mouth daily.   Yes Historical Provider, MD  cephALEXin (KEFLEX) 500 MG capsule Take 1 capsule (500 mg total) by mouth 4 (four) times daily. 01/28/16   Merryl Hacker, MD  diphenhydrAMINE (BENADRYL) 12.5 MG/5ML elixir Take 5 mLs (12.5 mg total) by mouth every 6 (six) hours as needed for itching. 01/11/16   Shawn M Rayburn, PA-C   BP 122/67 mmHg  Pulse 101  Temp(Src) 102.9 F (39.4 C) (Oral)  Resp 15  Ht 5\' 7"  (1.702 m)  Wt 175 lb (79.379 kg)  BMI 27.40 kg/m2  SpO2 98%   Physical Exam  Constitutional: She is oriented to person, place, and time. She appears well-developed and well-nourished. No distress.  HENT:  Head: Normocephalic and atraumatic.  Eyes: Pupils are equal, round, and reactive to light.  Cardiovascular: Regular rhythm and normal heart sounds.   Tachycardia  Pulmonary/Chest: Effort normal and breath sounds  normal. No respiratory distress. She has no wheezes.  Incisions in the bilateral axilla and below the bilateral breasts clean dry and intact, well healing, 2 drains in the bilateral lower chest walls, serosanguineous fluid drainage noted, mild erythema noted around the right drain with some tenderness to palpation, no crepitus  Abdominal: Soft. Bowel sounds are normal.  Neurological: She is alert and oriented to person, place, and time.  Skin: Skin is warm and dry.  Psychiatric: She has a normal mood and affect.  Nursing note and vitals reviewed.   ED Course  Procedures  DIAGNOSTIC STUDIES:  Oxygen Saturation is 100% on RA, normal by my interpretation.    COORDINATION OF CARE:  11:07 PM Will evaluate imaging when it returns and administer pain medication. Discussed treatment plan with pt at bedside and pt agreed  to plan.  12:44 AM Consult with surgery.  Labs Review Labs Reviewed  COMPREHENSIVE METABOLIC PANEL - Abnormal; Notable for the following:    Glucose, Bld 120 (*)    Albumin 3.4 (*)    All other components within normal limits  CBC WITH DIFFERENTIAL/PLATELET - Abnormal; Notable for the following:    WBC 14.2 (*)    Hemoglobin 11.8 (*)    Neutro Abs 11.2 (*)    Monocytes Absolute 1.3 (*)    All other components within normal limits  URINALYSIS, ROUTINE W REFLEX MICROSCOPIC (NOT AT [facility]) - Abnormal; Notable for the following:    APPearance HAZY (*)    All other components within normal limits  CULTURE, BLOOD (ROUTINE X 2)  CULTURE, BLOOD (ROUTINE X 2)  URINE CULTURE  I-STAT BETA HCG BLOOD, ED ([facility], [facility], [facility] ONLY)  I-STAT CG4 LACTIC ACID, ED  I-STAT CG4 LACTIC ACID, ED    Imaging Review Dg Chest 2 View  01/28/2016  CLINICAL DATA:  Initial evaluation for possible sepsis. EXAM: CHEST  2 VIEW COMPARISON:  Prior radiograph from 01/08/2007. FINDINGS: Cardiac and mediastinal silhouettes are within normal limits. Lungs are normally inflated. No focal infiltrate, pulmonary  edema, or pleural effusion. No pneumothorax. Surgical clips overlie the left axilla. Bilateral breast implants in place. IMPRESSION: No radiographic evidence for active cardiopulmonary disease. Electronically Signed   By: Jeannine Boga M.D.   On: 01/28/2016 00:27   I have personally reviewed and evaluated these images and lab results as part of my medical decision-making.   EKG Interpretation None      MDM   Final diagnoses:  Other specified fever  Cellulitis of other specified site    Patient presents with fever. Recent mastectomy and breast augmentation for rest cancer. She denies any infectious symptoms. She is nontoxic but febrile to 102.9 and tachycardic. She is not hypotensive. Basic labwork is notable for leukocytosis. On physical exam she is draining serosanguineous drainage in her bilateral drains. No purulent drainage. Breath sounds are clear. She does have mild erythema surrounding the right drainage site which is tender to palpation. This could be early cellulitis. Lactate is normal. She does not appear to be septic. Patient was given fluids. She was also given a dose of Keflex. Discuss with Dr. Barry Dienes. Will discharge home with Keflex and close follow-up with Dr. Marla Roe as scheduled today.  After history, exam, and medical workup I feel the patient has been appropriately medically screened and is safe for discharge home. Pertinent diagnoses were discussed with the patient. Patient was given return precautions.   I personally performed the services described in this documentation, which was scribed in my presence. The recorded information has been reviewed and is accurate.    Merryl Hacker, MD 01/28/16 [place]

## 2016-01-27 NOTE — ED Notes (Signed)
Pt states she took 650mg  of Tylenol already tonight PTA around 2100.

## 2016-01-28 MED ORDER — VANCOMYCIN HCL IN DEXTROSE 1-5 GM/200ML-% IV SOLN: 1000.0000 mg | Freq: Once | INTRAVENOUS | Status: DC

## 2016-01-28 MED ORDER — CEPHALEXIN 250 MG PO CAPS
500.0000 mg | ORAL_CAPSULE | Freq: Once | ORAL | Status: AC
  Administered 2016-01-28: 500 mg via ORAL
  Filled 2016-01-28: qty 2

## 2016-01-28 MED ORDER — CEPHALEXIN 500 MG PO CAPS: 500.0000 mg | ORAL_CAPSULE | Freq: Four times a day (QID) | ORAL | Status: DC

## 2016-01-28 NOTE — Discharge Instructions (Signed)
You were seen today for fever. Your workup is largely reassuring. You do have some redness around your drainage site which could be early cellulitis. Follow-up closely with your surgeon. If you develop any new or worsening symptoms he'll need to be reevaluated immediately.  Cellulitis Cellulitis is an infection of the skin and the tissue beneath it. The infected area is usually red and tender. Cellulitis occurs most often in the arms and lower legs.  CAUSES  Cellulitis is caused by bacteria that enter the skin through cracks or cuts in the skin. The most common types of bacteria that cause cellulitis are staphylococci and streptococci. SIGNS AND SYMPTOMS   Redness and warmth.  Swelling.  Tenderness or pain.  Fever. DIAGNOSIS  Your health care provider can usually determine what is wrong based on a physical exam. Blood tests may also be done. TREATMENT  Treatment usually involves taking an antibiotic medicine. HOME CARE INSTRUCTIONS   Take your antibiotic medicine as directed by your health care provider. Finish the antibiotic even if you start to feel better.  Keep the infected arm or leg elevated to reduce swelling.  Apply a warm cloth to the affected area up to 4 times per day to relieve pain.  Take medicines only as directed by your health care provider.  Keep all follow-up visits as directed by your health care provider. SEEK MEDICAL CARE IF:   You notice red streaks coming from the infected area.  Your red area gets larger or turns dark in color.  Your bone or joint underneath the infected area becomes painful after the skin has healed.  Your infection returns in the same area or another area.  You notice a swollen bump in the infected area.  You develop new symptoms.  You have a fever. SEEK IMMEDIATE MEDICAL CARE IF:   You feel very sleepy.  You develop vomiting or diarrhea.  You have a general ill feeling (malaise) with muscle aches and pains.   This  information is not intended to replace advice given to you by your health care provider. Make sure you discuss any questions you have with your health care provider.   Document Released: 06/04/2005 Document Revised: 05/16/2015 Document Reviewed: 11/10/2011 Elsevier Interactive Patient Education 2016 [employer].  Fever, Adult A fever is an increase in the body's temperature. It is usually defined as a temperature of 100F (38C) or higher. Brief mild or moderate fevers generally have no long-term effects, and they often do not require treatment. Moderate or high fevers may make you feel uncomfortable and can sometimes be a sign of a serious illness or disease. The sweating that may occur with repeated or prolonged fever may also cause dehydration. Fever is confirmed by taking a temperature with a thermometer. A measured temperature can vary with:  Age.  Time of day.  Location of the thermometer:  Mouth (oral).  Rectum (rectal).  Ear (tympanic).  Underarm (axillary).  Forehead (temporal). HOME CARE INSTRUCTIONS Pay attention to any changes in your symptoms. Take these actions to help with your condition:  Take over-the counter and prescription medicines only as told by your health care provider. Follow the dosing instructions carefully.  If you were prescribed an antibiotic medicine, take it as told by your health care provider. Do not stop taking the antibiotic even if you start to feel better.  Rest as needed.  Drink enough fluid to keep your urine clear or pale yellow. This helps to prevent dehydration.  Sponge yourself or bathe  with room-temperature water to help reduce your body temperature as needed. Do not use ice water.  Do not overbundle yourself in blankets or heavy clothes. SEEK MEDICAL CARE IF:  You vomit.  You cannot eat or drink without vomiting.  You have diarrhea.  You have pain when you urinate.  Your symptoms do not improve with treatment.  You  develop new symptoms.  You develop excessive weakness. SEEK IMMEDIATE MEDICAL CARE IF:  You have shortness of breath or have trouble breathing.  You are dizzy or you faint.  You are disoriented or confused.  You develop signs of dehydration, such as a dry mouth, decreased urination, or paleness.  You develop severe pain in your abdomen.  You have persistent vomiting or diarrhea.  You develop a skin rash.  Your symptoms suddenly get worse.   This information is not intended to replace advice given to you by your health care provider. Make sure you discuss any questions you have with your health care provider.   Document Released: 02/18/2001 Document Revised: 05/16/2015 Document Reviewed: 10/19/2014 Elsevier Interactive Patient Education [employer].

## 2016-01-29 ENCOUNTER — Telehealth: Payer: Self-pay | Admitting: *Deleted

## 2016-01-29 DIAGNOSIS — C50112 Malignant neoplasm of central portion of left female breast: Secondary | ICD-10-CM | POA: Diagnosis not present

## 2016-01-29 DIAGNOSIS — Z9013 Acquired absence of bilateral breasts and nipples: Secondary | ICD-10-CM | POA: Diagnosis not present

## 2016-01-29 LAB — URINE CULTURE

## 2016-01-29 MED ORDER — TAMOXIFEN CITRATE 20 MG PO TABS: 20.0000 mg | ORAL_TABLET | Freq: Every day | ORAL | Status: DC

## 2016-01-29 NOTE — Telephone Encounter (Signed)
Received oncotype score of 5/5%. Called pt with results. Discussed Tamoxifen and need for pt to call PCP to change Wellbutrin to Effexor. Discussed survivorship program and referral. Encourage pt to call with needs. Received verbal understanding.

## 2016-01-30 ENCOUNTER — Telehealth: Payer: Self-pay | Admitting: Hematology and Oncology

## 2016-01-30 ENCOUNTER — Encounter (HOSPITAL_COMMUNITY): Payer: Self-pay

## 2016-01-30 NOTE — Telephone Encounter (Signed)
s.w. pt and advised on Aug appts....pt ok and aware °

## 2016-02-01 ENCOUNTER — Encounter (HOSPITAL_COMMUNITY): Payer: Self-pay

## 2016-02-01 LAB — CULTURE, BLOOD (ROUTINE X 2)
Culture: NO GROWTH
Culture: NO GROWTH

## 2016-02-08 ENCOUNTER — Encounter (HOSPITAL_BASED_OUTPATIENT_CLINIC_OR_DEPARTMENT_OTHER): Payer: Self-pay | Admitting: *Deleted

## 2016-02-08 ENCOUNTER — Ambulatory Visit: Payer: Self-pay | Admitting: Physician Assistant

## 2016-02-11 ENCOUNTER — Ambulatory Visit (HOSPITAL_BASED_OUTPATIENT_CLINIC_OR_DEPARTMENT_OTHER)
Admission: RE | Admit: 2016-02-11 | Discharge: 2016-02-11 | Disposition: A | Discharge status: Home / Self Care | Payer: BLUE CROSS/BLUE SHIELD | Source: Ambulatory Visit | Attending: Plastic Surgery | Admitting: Plastic Surgery

## 2016-02-11 ENCOUNTER — Encounter (HOSPITAL_BASED_OUTPATIENT_CLINIC_OR_DEPARTMENT_OTHER): Payer: Self-pay | Admitting: *Deleted

## 2016-02-11 ENCOUNTER — Ambulatory Visit (HOSPITAL_BASED_OUTPATIENT_CLINIC_OR_DEPARTMENT_OTHER): Payer: BLUE CROSS/BLUE SHIELD | Admitting: Anesthesiology

## 2016-02-11 ENCOUNTER — Encounter (HOSPITAL_BASED_OUTPATIENT_CLINIC_OR_DEPARTMENT_OTHER)
Admission: RE | Disposition: A | Discharge status: Home / Self Care | Payer: Self-pay | Source: Ambulatory Visit | Attending: Plastic Surgery

## 2016-02-11 DIAGNOSIS — Z853 Personal history of malignant neoplasm of breast: Secondary | ICD-10-CM | POA: Insufficient documentation

## 2016-02-11 DIAGNOSIS — L7682 Other postprocedural complications of skin and subcutaneous tissue: Secondary | ICD-10-CM | POA: Diagnosis not present

## 2016-02-11 DIAGNOSIS — F419 Anxiety disorder, unspecified: Secondary | ICD-10-CM | POA: Diagnosis not present

## 2016-02-11 DIAGNOSIS — M1990 Unspecified osteoarthritis, unspecified site: Secondary | ICD-10-CM | POA: Diagnosis not present

## 2016-02-11 DIAGNOSIS — L7634 Postprocedural seroma of skin and subcutaneous tissue following other procedure: Secondary | ICD-10-CM | POA: Insufficient documentation

## 2016-02-11 DIAGNOSIS — T814XXA Infection following a procedure, initial encounter: Secondary | ICD-10-CM | POA: Diagnosis not present

## 2016-02-11 DIAGNOSIS — Y838 Other surgical procedures as the cause of abnormal reaction of the patient, or of later complication, without mention of misadventure at the time of the procedure: Secondary | ICD-10-CM | POA: Insufficient documentation

## 2016-02-11 DIAGNOSIS — Z87891 Personal history of nicotine dependence: Secondary | ICD-10-CM | POA: Diagnosis not present

## 2016-02-11 DIAGNOSIS — C50912 Malignant neoplasm of unspecified site of left female breast: Secondary | ICD-10-CM | POA: Diagnosis not present

## 2016-02-11 HISTORY — PX: INCISION AND DRAINAGE OF WOUND: SHX1803

## 2016-02-11 HISTORY — PX: REMOVAL OF TISSUE EXPANDER: SHX6324

## 2016-02-11 SURGERY — IRRIGATION AND DEBRIDEMENT WOUND
Anesthesia: General | Site: Breast | Laterality: Bilateral

## 2016-02-11 MED ORDER — HYDROCODONE-ACETAMINOPHEN 5-325 MG PO TABS: 1.0000 | ORAL_TABLET | Freq: Four times a day (QID) | ORAL | Status: DC | PRN

## 2016-02-11 MED ORDER — FENTANYL CITRATE (PF) 100 MCG/2ML IJ SOLN
50.0000 ug | INTRAMUSCULAR | Status: AC | PRN
  Administered 2016-02-11: 25 ug via INTRAVENOUS
  Administered 2016-02-11: 50 ug via INTRAVENOUS
  Administered 2016-02-11: 25 ug via INTRAVENOUS
  Administered 2016-02-11: 50 ug via INTRAVENOUS
  Administered 2016-02-11 (×2): 25 ug via INTRAVENOUS
  Administered 2016-02-11: 100 ug via INTRAVENOUS

## 2016-02-11 MED ORDER — ONDANSETRON HCL 4 MG/2ML IJ SOLN
INTRAMUSCULAR | Status: AC
  Filled 2016-02-11: qty 2

## 2016-02-11 MED ORDER — DEXAMETHASONE SODIUM PHOSPHATE 4 MG/ML IJ SOLN
INTRAMUSCULAR | Status: DC | PRN
  Administered 2016-02-11: 10 mg via INTRAVENOUS

## 2016-02-11 MED ORDER — MIDAZOLAM HCL 2 MG/2ML IJ SOLN
1.0000 mg | INTRAMUSCULAR | Status: DC | PRN
  Administered 2016-02-11: 2 mg via INTRAVENOUS

## 2016-02-11 MED ORDER — SODIUM CHLORIDE 0.9 % IR SOLN
Status: DC | PRN
  Administered 2016-02-11: 1000 mL

## 2016-02-11 MED ORDER — LIDOCAINE HCL (PF) 1 % IJ SOLN
INTRAMUSCULAR | Status: AC
  Filled 2016-02-11: qty 30

## 2016-02-11 MED ORDER — PROPOFOL 10 MG/ML IV BOLUS
INTRAVENOUS | Status: AC
  Filled 2016-02-11: qty 20

## 2016-02-11 MED ORDER — METOCLOPRAMIDE HCL 5 MG/ML IJ SOLN: 10.0000 mg | Freq: Once | INTRAMUSCULAR | Status: DC | PRN

## 2016-02-11 MED ORDER — CEFAZOLIN SODIUM-DEXTROSE 2-4 GM/100ML-% IV SOLN: 2.0000 g | INTRAVENOUS | Status: DC

## 2016-02-11 MED ORDER — GLYCOPYRROLATE 0.2 MG/ML IJ SOLN: 0.2000 mg | Freq: Once | INTRAMUSCULAR | Status: DC | PRN

## 2016-02-11 MED ORDER — SCOPOLAMINE 1 MG/3DAYS TD PT72
1.0000 | MEDICATED_PATCH | Freq: Once | TRANSDERMAL | Status: DC | PRN
  Administered 2016-02-11: 1.5 mg via TRANSDERMAL

## 2016-02-11 MED ORDER — ARTIFICIAL TEARS OP OINT
TOPICAL_OINTMENT | OPHTHALMIC | Status: AC
  Filled 2016-02-11: qty 3.5

## 2016-02-11 MED ORDER — BUPIVACAINE-EPINEPHRINE (PF) 0.25% -1:200000 IJ SOLN
INTRAMUSCULAR | Status: AC
  Filled 2016-02-11: qty 30

## 2016-02-11 MED ORDER — FENTANYL CITRATE (PF) 100 MCG/2ML IJ SOLN
INTRAMUSCULAR | Status: AC
  Filled 2016-02-11: qty 2

## 2016-02-11 MED ORDER — LIDOCAINE 2% (20 MG/ML) 5 ML SYRINGE
INTRAMUSCULAR | Status: DC | PRN
  Administered 2016-02-11: 100 mg via INTRAVENOUS

## 2016-02-11 MED ORDER — CEFAZOLIN SODIUM-DEXTROSE 2-4 GM/100ML-% IV SOLN
INTRAVENOUS | Status: AC
  Filled 2016-02-11: qty 100

## 2016-02-11 MED ORDER — LIDOCAINE 2% (20 MG/ML) 5 ML SYRINGE
INTRAMUSCULAR | Status: AC
  Filled 2016-02-11: qty 5

## 2016-02-11 MED ORDER — 0.9 % SODIUM CHLORIDE (POUR BTL) OPTIME
TOPICAL | Status: DC | PRN
  Administered 2016-02-11: 1000 mL

## 2016-02-11 MED ORDER — BUPIVACAINE HCL (PF) 0.25 % IJ SOLN
INTRAMUSCULAR | Status: AC
  Filled 2016-02-11: qty 30

## 2016-02-11 MED ORDER — CEFAZOLIN SODIUM-DEXTROSE 2-4 GM/100ML-% IV SOLN
2.0000 g | INTRAVENOUS | Status: AC
  Administered 2016-02-11: 2 g via INTRAVENOUS

## 2016-02-11 MED ORDER — PROPOFOL 10 MG/ML IV BOLUS
INTRAVENOUS | Status: DC | PRN
  Administered 2016-02-11: 200 mg via INTRAVENOUS

## 2016-02-11 MED ORDER — DEXAMETHASONE SODIUM PHOSPHATE 10 MG/ML IJ SOLN
INTRAMUSCULAR | Status: AC
  Filled 2016-02-11: qty 1

## 2016-02-11 MED ORDER — ONDANSETRON HCL 4 MG/2ML IJ SOLN
INTRAMUSCULAR | Status: DC | PRN
  Administered 2016-02-11: 4 mg via INTRAVENOUS

## 2016-02-11 MED ORDER — LACTATED RINGERS IV SOLN
INTRAVENOUS | Status: DC
Start: 2016-02-11 — End: 2016-02-11

## 2016-02-11 MED ORDER — LACTATED RINGERS IV SOLN
INTRAVENOUS | Status: DC
Start: 2016-02-11 — End: 2016-02-11
  Administered 2016-02-11 (×2): via INTRAVENOUS

## 2016-02-11 MED ORDER — LIDOCAINE-EPINEPHRINE 1 %-1:100000 IJ SOLN
INTRAMUSCULAR | Status: AC
  Filled 2016-02-11: qty 1

## 2016-02-11 MED ORDER — MIDAZOLAM HCL 2 MG/2ML IJ SOLN
INTRAMUSCULAR | Status: AC
  Filled 2016-02-11: qty 2

## 2016-02-11 MED ORDER — FENTANYL CITRATE (PF) 100 MCG/2ML IJ SOLN
25.0000 ug | INTRAMUSCULAR | Status: DC | PRN
  Administered 2016-02-11 (×4): 25 ug via INTRAVENOUS

## 2016-02-11 MED ORDER — MEPERIDINE HCL 25 MG/ML IJ SOLN: 6.2500 mg | INTRAMUSCULAR | Status: DC | PRN

## 2016-02-11 MED ORDER — MUPIROCIN 2 % EX OINT
TOPICAL_OINTMENT | CUTANEOUS | Status: DC | PRN
  Administered 2016-02-11: 1 via TOPICAL

## 2016-02-11 MED ORDER — SCOPOLAMINE 1 MG/3DAYS TD PT72
MEDICATED_PATCH | TRANSDERMAL | Status: AC
  Filled 2016-02-11: qty 1

## 2016-02-11 MED ORDER — MUPIROCIN 2 % EX OINT
TOPICAL_OINTMENT | CUTANEOUS | Status: AC
  Filled 2016-02-11: qty 22

## 2016-02-11 SURGICAL SUPPLY — 93 items
BAG DECANTER FOR FLEXI CONT (MISCELLANEOUS) ×4 IMPLANT
BANDAGE ACE 3X5.8 VEL STRL LF (GAUZE/BANDAGES/DRESSINGS) IMPLANT
BANDAGE ACE 4X5 VEL STRL LF (GAUZE/BANDAGES/DRESSINGS) IMPLANT
BANDAGE ACE 6X5 VEL STRL LF (GAUZE/BANDAGES/DRESSINGS) IMPLANT
BINDER BREAST LRG (GAUZE/BANDAGES/DRESSINGS) IMPLANT
BINDER BREAST MEDIUM (GAUZE/BANDAGES/DRESSINGS) IMPLANT
BINDER BREAST XLRG (GAUZE/BANDAGES/DRESSINGS) ×2 IMPLANT
BINDER BREAST XXLRG (GAUZE/BANDAGES/DRESSINGS) IMPLANT
BIOPATCH RED 1 DISK 7.0 (GAUZE/BANDAGES/DRESSINGS) IMPLANT
BLADE HEX COATED 2.75 (ELECTRODE) ×2 IMPLANT
BLADE SURG 10 STRL SS (BLADE) IMPLANT
BLADE SURG 15 STRL LF DISP TIS (BLADE) ×2 IMPLANT
BLADE SURG 15 STRL SS (BLADE) ×2
BNDG CONFORM 2 STRL LF (GAUZE/BANDAGES/DRESSINGS) IMPLANT
BNDG CONFORM 3 STRL LF (GAUZE/BANDAGES/DRESSINGS) IMPLANT
BNDG GAUZE ELAST 4 BULKY (GAUZE/BANDAGES/DRESSINGS) IMPLANT
CANISTER SUCT 1200ML W/VALVE (MISCELLANEOUS) ×6 IMPLANT
CHLORAPREP W/TINT 26ML (MISCELLANEOUS) ×6 IMPLANT
CONT SPEC 4OZ CLIKSEAL STRL BL (MISCELLANEOUS) ×4 IMPLANT
CORDS BIPOLAR (ELECTRODE) IMPLANT
COVER BACK TABLE 60X90IN (DRAPES) ×2 IMPLANT
COVER MAYO STAND STRL (DRAPES) ×2 IMPLANT
DECANTER SPIKE VIAL GLASS SM (MISCELLANEOUS) IMPLANT
DERMABOND ADVANCED (GAUZE/BANDAGES/DRESSINGS)
DERMABOND ADVANCED .7 DNX12 (GAUZE/BANDAGES/DRESSINGS) IMPLANT
DRAIN CHANNEL 19F RND (DRAIN) ×4 IMPLANT
DRAPE INCISE IOBAN 66X45 STRL (DRAPES) IMPLANT
DRAPE LAPAROSCOPIC ABDOMINAL (DRAPES) ×2 IMPLANT
DRAPE U-SHAPE 76X120 STRL (DRAPES) IMPLANT
DRESSING DUODERM 4X4 STERILE (GAUZE/BANDAGES/DRESSINGS) IMPLANT
DRSG ADAPTIC 3X8 NADH LF (GAUZE/BANDAGES/DRESSINGS) IMPLANT
DRSG EMULSION OIL 3X3 NADH (GAUZE/BANDAGES/DRESSINGS) IMPLANT
DRSG PAD ABDOMINAL 8X10 ST (GAUZE/BANDAGES/DRESSINGS) ×4 IMPLANT
ELECT BLADE 4.0 EZ CLEAN MEGAD (MISCELLANEOUS) ×2
ELECT REM PT RETURN 9FT ADLT (ELECTROSURGICAL) ×2
ELECTRODE BLDE 4.0 EZ CLN MEGD (MISCELLANEOUS) ×1 IMPLANT
ELECTRODE REM PT RTRN 9FT ADLT (ELECTROSURGICAL) ×1 IMPLANT
EVACUATOR SILICONE 100CC (DRAIN) ×4 IMPLANT
GAUZE SPONGE 4X4 12PLY STRL (GAUZE/BANDAGES/DRESSINGS) ×2 IMPLANT
GAUZE XEROFORM 1X8 LF (GAUZE/BANDAGES/DRESSINGS) IMPLANT
GAUZE XEROFORM 5X9 LF (GAUZE/BANDAGES/DRESSINGS) IMPLANT
GLOVE BIO SURGEON STRL SZ 6.5 (GLOVE) ×8 IMPLANT
GLOVE BIO SURGEON STRL SZ8 (GLOVE) IMPLANT
GLOVE SURG SS PI 7.0 STRL IVOR (GLOVE) ×2 IMPLANT
GOWN STRL REUS W/ TWL LRG LVL3 (GOWN DISPOSABLE) ×3 IMPLANT
GOWN STRL REUS W/TWL LRG LVL3 (GOWN DISPOSABLE) ×3
IV NS 1000ML (IV SOLUTION)
IV NS 1000ML BAXH (IV SOLUTION) IMPLANT
IV NS 500ML (IV SOLUTION)
IV NS 500ML BAXH (IV SOLUTION) IMPLANT
KIT FILL SYSTEM UNIVERSAL (SET/KITS/TRAYS/PACK) IMPLANT
MANIFOLD NEPTUNE II (INSTRUMENTS) IMPLANT
NDL SAFETY ECLIPSE 18X1.5 (NEEDLE) ×1 IMPLANT
NEEDLE HYPO 18GX1.5 SHARP (NEEDLE) ×1
NEEDLE HYPO 25X1 1.5 SAFETY (NEEDLE) IMPLANT
NS IRRIG 1000ML POUR BTL (IV SOLUTION) ×2 IMPLANT
PACK BASIN DAY SURGERY FS (CUSTOM PROCEDURE TRAY) ×2 IMPLANT
PADDING CAST ABS 3INX4YD NS (CAST SUPPLIES)
PADDING CAST ABS 4INX4YD NS (CAST SUPPLIES)
PADDING CAST ABS COTTON 3X4 (CAST SUPPLIES) IMPLANT
PADDING CAST ABS COTTON 4X4 ST (CAST SUPPLIES) IMPLANT
PENCIL BUTTON HOLSTER BLD 10FT (ELECTRODE) ×2 IMPLANT
PIN SAFETY STERILE (MISCELLANEOUS) ×2 IMPLANT
SHEET MEDIUM DRAPE 40X70 STRL (DRAPES) ×2 IMPLANT
SLEEVE SCD COMPRESS KNEE MED (MISCELLANEOUS) ×2 IMPLANT
SPLINT PLASTER CAST XFAST 3X15 (CAST SUPPLIES) IMPLANT
SPLINT PLASTER XTRA FASTSET 3X (CAST SUPPLIES)
SPONGE GAUZE 4X4 12PLY STER LF (GAUZE/BANDAGES/DRESSINGS) IMPLANT
SPONGE LAP 18X18 X RAY DECT (DISPOSABLE) ×6 IMPLANT
STAPLER VISISTAT 35W (STAPLE) IMPLANT
STRIP CLOSURE SKIN 1/2X4 (GAUZE/BANDAGES/DRESSINGS) IMPLANT
SURGILUBE 2OZ TUBE FLIPTOP (MISCELLANEOUS) IMPLANT
SUT MNCRL AB 4-0 PS2 18 (SUTURE) ×4 IMPLANT
SUT MON AB 3-0 SH 27 (SUTURE) ×2
SUT MON AB 3-0 SH27 (SUTURE) ×2 IMPLANT
SUT MON AB 5-0 PS2 18 (SUTURE) ×4 IMPLANT
SUT PDS AB 2-0 CT2 27 (SUTURE) ×2 IMPLANT
SUT SILK 3 0 PS 1 (SUTURE) ×4 IMPLANT
SUT SILK 4 0 PS 2 (SUTURE) IMPLANT
SUT VIC AB 3-0 SH 27 (SUTURE)
SUT VIC AB 3-0 SH 27X BRD (SUTURE) IMPLANT
SUT VIC AB 5-0 PS2 18 (SUTURE) IMPLANT
SUT VICRYL 4-0 PS2 18IN ABS (SUTURE) IMPLANT
SWAB COLLECTION DEVICE MRSA (MISCELLANEOUS) ×2 IMPLANT
SWAB CULTURE ESWAB REG 1ML (MISCELLANEOUS) ×2 IMPLANT
SYR BULB IRRIGATION 50ML (SYRINGE) ×2 IMPLANT
SYR CONTROL 10ML LL (SYRINGE) IMPLANT
TAPE HYPAFIX 6X30 (GAUZE/BANDAGES/DRESSINGS) IMPLANT
TOWEL OR 17X24 6PK STRL BLUE (TOWEL DISPOSABLE) ×4 IMPLANT
TRAY DSU PREP LF (CUSTOM PROCEDURE TRAY) IMPLANT
TUBE CONNECTING 20X1/4 (TUBING) ×2 IMPLANT
UNDERPAD 30X30 (UNDERPADS AND DIAPERS) ×4 IMPLANT
YANKAUER SUCT BULB TIP NO VENT (SUCTIONS) ×2 IMPLANT

## 2016-02-11 NOTE — Interval H&P Note (Signed)
History and Physical Interval Note:  02/11/2016 12:03 PM  Christina [name]  has presented today for surgery, with the diagnosis of SEROMA AFTER PLACEMENT OF BILATERAL TISSUE EXPANDERS AND ACELLULAR DERMIS MATRIX  The various methods of treatment have been discussed with the patient and family. After consideration of risks, benefits and other options for treatment, the patient has consented to  Procedure(s): IRRIGATION AND DEBRIDEMENT OF BILATERAL BREAST POCKET (Bilateral) REMOVAL OF BILATERAL TISSUE EXPANDERS WITH POSSIBLE  REPLACEMENT OF ACELLULAR DERMIS AND TISSUE EXPANDERS (Bilateral) as a surgical intervention .  The patient's history has been reviewed, patient examined, no change in status, stable for surgery.  I have reviewed the patient's chart and labs.  Questions were answered to the patient's satisfaction.     Wallace Going

## 2016-02-11 NOTE — Anesthesia Postprocedure Evaluation (Signed)
Anesthesia Post Note  Patient: Christina [name]  Procedure(s) Performed: Procedure(s) (LRB): IRRIGATION AND DEBRIDEMENT OF BILATERAL BREAST POCKET (Bilateral) REMOVAL OF BILATERAL TISSUE EXPANDERS AND FLEX HD REMOVAL (Bilateral)  Patient location during evaluation: PACU Anesthesia Type: General Level of consciousness: awake and alert Pain management: pain level controlled Vital Signs Assessment: post-procedure vital signs reviewed and stable Respiratory status: spontaneous breathing, nonlabored ventilation, respiratory function stable and patient connected to nasal cannula oxygen Cardiovascular status: blood pressure returned to baseline and stable Postop Assessment: no signs of nausea or vomiting Anesthetic complications: no    Last Vitals:  Filed Vitals:   02/11/16 1214 02/11/16 1445  BP: 129/74 129/66  Pulse: 94 93  Temp: 36.8 C 37 C  Resp: 20 20    Last Pain:  Filed Vitals:   02/11/16 1456  PainSc: 0-No pain                 Montez Hageman

## 2016-02-11 NOTE — Anesthesia Preprocedure Evaluation (Addendum)
Anesthesia Evaluation  Patient identified by MRN, date of birth, ID band Patient awake    Reviewed: Allergy & Precautions, NPO status , Patient's Chart, lab work & pertinent test results  History of Anesthesia Complications (+) PONV  Airway Mallampati: II  TM Distance: >3 FB Neck ROM: Full    Dental  (+) Teeth Intact, Dental Advisory Given, Partial Upper   Pulmonary former smoker,    Pulmonary exam normal breath sounds clear to auscultation       Cardiovascular Exercise Tolerance: Good negative cardio ROS Normal cardiovascular exam Rhythm:Regular Rate:Normal     Neuro/Psych PSYCHIATRIC DISORDERS Anxiety Depression negative neurological ROS     GI/Hepatic negative GI ROS, Neg liver ROS,   Endo/Other  Obesity   Renal/GU negative Renal ROS   Urgency     Musculoskeletal  (+) Arthritis , Osteoarthritis,    Abdominal   Peds  Hematology negative hematology ROS (+)   Anesthesia Other Findings Day of surgery medications reviewed with the patient.  Left breast cancer  Reproductive/Obstetrics                            Anesthesia Physical  Anesthesia Plan  ASA: III  Anesthesia Plan: General   Post-op Pain Management:    Induction: Intravenous  Airway Management Planned: Oral ETT and LMA  Additional Equipment:   Intra-op Plan:   Post-operative Plan: Extubation in OR  Informed Consent: I have reviewed the patients History and Physical, chart, labs and discussed the procedure including the risks, benefits and alternatives for the proposed anesthesia with the patient or authorized representative who has indicated his/her understanding and acceptance.   Dental advisory given  Plan Discussed with: CRNA  Anesthesia Plan Comments:         Anesthesia Quick Evaluation

## 2016-02-11 NOTE — Op Note (Signed)
OPERATIVE NOTE  DATE OF OPERATION: 02/11/2016  LOCATION: [place]  PREOPERATIVE DIAGNOSIS: Breast Cancer, post bilateral mastectomies with Tissue Expander and FlexHD placement - seroma, infection  POSTOPERATIVE DIAGNOSIS: Same  PROCEDURE:  Removal of bilateral breast Tissue Expander and FlexHD  SURGEON: [name] Richins Sanger Prudy Candy, DO  ASSISTANT: Shawn Rayburn, PA  ANESTHESIA: General and Local  EBL: Minimal  CONDITION: Stable  COMPLICATIONS: None  INDICATION:  The patient, Christina [name], is a 50 y.o. year old female born on [birth year].  She underwent bilateral mastectomies with tissue expanders and Flex HD placement over one month ago.  She was found to have a severe seroma on both sides.  There was concern for infection and cultures showed positive.  She presents for removal and replacement if possible. Otherwise removal and delayed replacement.  Risks and complications were discussed and included bleeding, pain, scar and risk of infection. [id number]  PROCEDURE DETAILS:  The patient was taken to the operating room and placed on the operating room table in the supine position.  General anesthetic was administered.  All bone prominent areas were padded.  The involved area was prepped and draped in a sterile fashion.  A time out was called and all information was confirmed to be correct by all in the room.  The previous incision was opened on the left breast.  The expander was released from the tissue and removed completely. The Flex HD was not incorporated and therefore removed.  The drainage was green in color.  The Flex was sent for gram stain culture and sensitivity.  The pocket was irrigated with antibiotic solution and hemostasis was achieved with electrocautery.  A drain was placed and secured to the skin with a 4-0 Silk.  The deep layers were closed with 3-0 Monocryl.  The subcutaneous tissue was re-approximated with 4.0 Monocryl.  The skin edges were closed with a  running 5-0 Monocryl.    The previous incision was opened on the right breast.  The expander was released from the tissue and removed completely. The Flex HD was not incorporated and therefore removed.  The drainage was thick and yellow in color.  The Flex was sent for gram stain culture and sensitivity.  Additional fluid cultures were sent for micro as well.  The pocket was irrigated with antibiotic solution and hemostasis was achieved with electrocautery.  A drain was placed and secured to the skin with a 4-0 Silk.  The deep layers were closed with 3-0 Monocryl.  The subcutaneous tissue was re-approximated with 4.0 Monocryl.  The skin edges were closed with a running 5-0 Monocryl.  The skin on both sides looked much better by the end of the case.  The area was then washed off and a protective dressing was applied.  The patient tolerated the procedure well.  There were no complication.  The patient was allowed to wake from anesthesia and taken to the recovery room in stable condition. The family was notified at the end of the case.

## 2016-02-11 NOTE — Discharge Instructions (Signed)
About my Jackson-Pratt Bulb Drain  What is a Jackson-Pratt bulb? A Jackson-Pratt is a soft, round device used to collect drainage. It is connected to a long, thin drainage catheter, which is held in place by one or two small stiches near your surgical incision site. When the bulb is squeezed, it forms a vacuum, forcing the drainage to empty into the bulb.  Emptying the Jackson-Pratt bulb- To empty the bulb: 1. Release the plug on the top of the bulb. 2. Pour the bulb's contents into a measuring container which your nurse will provide. 3. Record the time emptied and amount of drainage. Empty the drain(s) as often as your     doctor or nurse recommends.  Date                  Time                    Amount (Drain 1)                 Amount (Drain 2)  _____________________________________________________________________  _____________________________________________________________________  _____________________________________________________________________  _____________________________________________________________________  _____________________________________________________________________  _____________________________________________________________________  _____________________________________________________________________  _____________________________________________________________________  Squeezing the Jackson-Pratt Bulb- To squeeze the bulb: 1. Make sure the plug at the top of the bulb is open. 2. Squeeze the bulb tightly in your fist. You will hear air squeezing from the bulb. 3. Replace the plug while the bulb is squeezed. 4. Use a safety pin to attach the bulb to your clothing. This will keep the catheter from     pulling at the bulb insertion site.  When to call your doctor- Call your doctor if:  Drain site becomes red, swollen or hot.  You have a fever greater than 101 degrees F.  There is oozing at the drain site.  Drain falls out (apply a guaze  bandage over the drain hole and secure it with tape).  Drainage increases daily not related to activity patterns. (You will usually have more drainage when you are active than when you are resting.)  Drainage has a bad odor.  Call your surgeon if you experience:   1.  Fever over 101.0. 2.  Inability to urinate. 3.  Nausea and/or vomiting. 4.  Extreme swelling or bruising at the surgical site. 5.  Continued bleeding from the incision. 6.  Increased pain, redness or drainage from the incision. 7.  Problems related to your pain medication. 8.  Any problems and/or concerns   Post Anesthesia Home Care Instructions  Activity: Get plenty of rest for the remainder of the day. A responsible adult should stay with you for 24 hours following the procedure.  For the next 24 hours, DO NOT: -Drive a car -Paediatric nurse -Drink alcoholic beverages -Take any medication unless instructed by your physician -Make any legal decisions or sign important papers.  Meals: Start with liquid foods such as gelatin or soup. Progress to regular foods as tolerated. Avoid greasy, spicy, heavy foods. If nausea and/or vomiting occur, drink only clear liquids until the nausea and/or vomiting subsides. Call your physician if vomiting continues.  Special Instructions/Symptoms: Your throat may feel dry or sore from the anesthesia or the breathing tube placed in your throat during surgery. If this causes discomfort, gargle with warm salt water. The discomfort should disappear within 24 hours.  If you had a scopolamine patch placed behind your ear for the management of post- operative nausea and/or vomiting:  1. The medication in the patch is effective for 72 hours,  after which it should be removed.  Wrap patch in a tissue and discard in the trash. Wash hands thoroughly with soap and water. 2. You may remove the patch earlier than 72 hours if you experience unpleasant side effects which may include dry mouth,  dizziness or visual disturbances. 3. Avoid touching the patch. Wash your hands with soap and water after contact with the patch.   May shower. Continue binder or sports bra. Drain care.

## 2016-02-11 NOTE — Brief Op Note (Signed)
02/11/2016  2:41 PM  PATIENT:  Christina [name]  50 y.o. female  PRE-OPERATIVE DIAGNOSIS:  SEROMA AFTER PLACEMENT OF BILATERAL TISSUE EXPANDERS AND ACELLULAR DERMIS MATRIX  POST-OPERATIVE DIAGNOSIS:  same  PROCEDURE:  Procedure(s): IRRIGATION AND DEBRIDEMENT OF BILATERAL BREAST POCKET (Bilateral) REMOVAL OF BILATERAL TISSUE EXPANDERS WITH POSSIBLE  REPLACEMENT OF ACELLULAR DERMIS AND TISSUE EXPANDERS (Bilateral)  SURGEON:  Surgeon(s) and Role:    * Darice Vicario S Sister Carbone, DO - Primary  PHYSICIAN ASSISTANT: Shawn Rayburn, PA  ASSISTANTS: none   ANESTHESIA:   general  EBL:  Total I/O In: 1300 [I.V.:1300] Out: 100 [Blood:100]  BLOOD ADMINISTERED:none  DRAINS: (2) Jackson-Pratt drain(s) with closed bulb suction in the breast pocket   LOCAL MEDICATIONS USED:  NONE  SPECIMEN:  Source of Specimen:  micro bilaterally  DISPOSITION OF SPECIMEN:  micro  COUNTS:  YES  TOURNIQUET:  * No tourniquets in log *  DICTATION: .Dragon Dictation  PLAN OF CARE: Discharge to home after PACU  PATIENT DISPOSITION:  PACU - hemodynamically stable.   Delay start of Pharmacological VTE agent (>24hrs) due to surgical blood loss or risk of bleeding: no

## 2016-02-11 NOTE — Anesthesia Procedure Notes (Signed)
Procedure Name: LMA Insertion Date/Time: 02/11/2016 12:15 PM Performed by: Lieutenant Diego Pre-anesthesia Checklist: Patient identified, Emergency Drugs available, Suction available and Patient being monitored Patient Re-evaluated:Patient Re-evaluated prior to inductionOxygen Delivery Method: Circle system utilized Preoxygenation: Pre-oxygenation with 100% oxygen Intubation Type: IV induction Ventilation: Mask ventilation without difficulty LMA: LMA inserted LMA Size: 4.0 Number of attempts: 1 Airway Equipment and Method: Bite block Placement Confirmation: positive ETCO2 and breath sounds checked- equal and bilateral Tube secured with: Tape Dental Injury: Teeth and Oropharynx as per pre-operative assessment

## 2016-02-11 NOTE — H&P (Signed)
Christina [name] is an 50 y.o. female.   Chief Complaint: breast seroma HPI: The patient is a 50 yrs old wf here for further treatment after mastectomy.  She is 5 weeks post op breast reconstruction.  Breast flaps are health and not red.  She has had high amounts of serous drainage since surgery.  It became cloudy and cultures showed MSSA and A. Ursingii.  She is being treated with antibiotics.  History: She went for a screening mammogram and was found to have calcifications of the LEFT breast in the subareolar location. The upper central location was also positive for calcifications and fibrocystic disease. 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 She is 5 feet 7 inches tall, weighs 189 pounds, Preop = 36 C. Her genetics was positive. She quit smoking in 2009. She had mild ptosis of her breasts.   Past Medical History  Diagnosis Date  . Arthritis     hands and knees  . Ovarian cyst     "they come and go" (01/09/2016)  . Anxiety   . PONV (postoperative nausea and vomiting)   . Depression   . Urgency of urination   . 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 Cesarean section  1987; 1989; 1991  . Achilles tendon repair Right 2010  . Appendectomy  [id number]    Early mucocele of appendix, removal of small leiomyoma from pelvis, lysis of adhesions  . Mastectomy Bilateral 01/09/2016  . Breast reconstruction with placement of tissue expander and flex hd (acellular hydrated dermis) Bilateral 01/09/2016  . Laparoscopic cholecystectomy  ~ 1999  . Breast biopsy Left 10/2015  . Abdominal hysterectomy  05/1996    endometriosis  . Tubal ligation  1991  . Nipple sparing mastectomy/sentinal lymph node biopsy/reconstruction/placement of tissue expander Bilateral 01/09/2016    Procedure: BILATERAL NIPPLE SPARING MASTECTOMY WITH LEFT SENTINAL LYMPH NODE BIOPSY ;  Surgeon: Stark Klein, MD;   Location: [facility];  Service: General;  Laterality: Bilateral;  . Breast reconstruction with placement of tissue expander and flex hd (acellular hydrated dermis) Bilateral 01/09/2016    Procedure: BREAST RECONSTRUCTION WITH PLACEMENT OF TISSUE EXPANDER AND FLEX HD (ACELLULAR HYDRATED DERMIS);  Surgeon: Loel Lofty Dillingham, DO;  Location: [facility];  Service: Plastics;  Laterality: Bilateral;    Family History  Problem Relation Age of Onset  . Heart failure Father   . Prostate cancer Father 60  . Colon polyps Mother     approx 2  . Other Mother     hx HPV and hysterectomy due to precancerous cells  . Other Sister 69    hx of hysterectomy for unspecified reason; still has ovaries  . Other Sister 3    paternal half-sister hx of hysterectomy for unspecified reason; still has ovaries  . Bladder Cancer Maternal Uncle 79    not a smoker  . Kidney failure Maternal Grandmother   . Congestive Heart Failure Maternal Grandmother   . Colon cancer Maternal Grandmother 15  . Lung cancer Maternal Grandfather 77    smoker  . Breast cancer Paternal Grandmother     dx. early 59s; w/ hx of trauma to breast  . Crohn's disease Daughter   . Lung cancer Maternal Uncle 57    smoker   Social History:  reports that she quit smoking about 7 years ago. Her smoking use included Cigarettes. She has a 10 pack-year smoking history.  She has never used smokeless tobacco. She reports that she does not drink alcohol or use illicit drugs.  Allergies: No Known Allergies  No prescriptions prior to admission    No results found for this or any previous visit (from the past 48 hour(s)). No results found.  Review of Systems  Constitutional: Positive for fever.  HENT: Negative.   Eyes: Negative.   Respiratory: Negative.   Cardiovascular: Negative.   Gastrointestinal: Negative.   Genitourinary: Negative.   Musculoskeletal: Negative.   Skin: Negative.   Psychiatric/Behavioral: Negative.     Height 5\' 7"  (1.702 m),  weight 83.915 kg (185 lb). Physical Exam  Constitutional: She is oriented to person, place, and time. She appears well-developed and well-nourished.  HENT:  Head: Normocephalic and atraumatic.  Eyes: Conjunctivae and EOM are normal. Pupils are equal, round, and reactive to light.  Cardiovascular: Normal rate.   Respiratory: Effort normal. No respiratory distress. She has no wheezes.  GI: Soft. She exhibits no distension. There is no tenderness.  Neurological: She is alert and oriented to person, place, and time.  Skin: Skin is warm.  Psychiatric: She has a normal mood and affect. Her behavior is normal. Thought content normal.     Assessment/Plan Plan for wash out and possible exchange of expanders in breasts.  Wallace Going, DO 02/11/2016, 7:22 AM

## 2016-02-11 NOTE — Transfer of Care (Signed)
Immediate Anesthesia Transfer of Care Note  Patient: Christina [name]  Procedure(s) Performed: Procedure(s): IRRIGATION AND DEBRIDEMENT OF BILATERAL BREAST POCKET (Bilateral) REMOVAL OF BILATERAL TISSUE EXPANDERS WITH POSSIBLE  REPLACEMENT OF ACELLULAR DERMIS AND TISSUE EXPANDERS (Bilateral)  Patient Location: PACU  Anesthesia Type:General  Level of Consciousness: awake  Airway & Oxygen Therapy: Patient Spontanous Breathing and Patient connected to face mask oxygen  Post-op Assessment: Report given to RN and Post -op Vital signs reviewed and stable  Post vital signs: Reviewed and stable  Last Vitals:  Filed Vitals:   02/11/16 1214 02/11/16 1445  BP: 129/74 129/66  Pulse: 94 93  Temp: 36.8 C   Resp: 20 20    Last Pain:  Filed Vitals:   02/11/16 1447  PainSc: 7          Complications: No apparent anesthesia complications

## 2016-02-12 ENCOUNTER — Encounter (HOSPITAL_BASED_OUTPATIENT_CLINIC_OR_DEPARTMENT_OTHER): Payer: Self-pay | Admitting: Plastic Surgery

## 2016-02-16 LAB — AEROBIC/ANAEROBIC CULTURE W GRAM STAIN (SURGICAL/DEEP WOUND)

## 2016-02-16 LAB — AEROBIC/ANAEROBIC CULTURE (SURGICAL/DEEP WOUND)

## 2016-02-18 DIAGNOSIS — C50912 Malignant neoplasm of unspecified site of left female breast: Secondary | ICD-10-CM | POA: Diagnosis not present

## 2016-02-26 DIAGNOSIS — M25521 Pain in right elbow: Secondary | ICD-10-CM | POA: Diagnosis not present

## 2016-03-17 DIAGNOSIS — N898 Other specified noninflammatory disorders of vagina: Secondary | ICD-10-CM | POA: Diagnosis not present

## 2016-03-17 DIAGNOSIS — C50912 Malignant neoplasm of unspecified site of left female breast: Secondary | ICD-10-CM | POA: Diagnosis not present

## 2016-03-18 ENCOUNTER — Other Ambulatory Visit: Payer: Self-pay | Admitting: Plastic Surgery

## 2016-03-18 DIAGNOSIS — N6489 Other specified disorders of breast: Secondary | ICD-10-CM

## 2016-03-18 DIAGNOSIS — R609 Edema, unspecified: Secondary | ICD-10-CM

## 2016-03-18 DIAGNOSIS — Z853 Personal history of malignant neoplasm of breast: Secondary | ICD-10-CM

## 2016-03-20 ENCOUNTER — Ambulatory Visit
Admission: RE | Admit: 2016-03-20 | Discharge: 2016-03-20 | Disposition: A | Discharge status: Home / Self Care | Payer: BLUE CROSS/BLUE SHIELD | Source: Ambulatory Visit | Attending: Plastic Surgery | Admitting: Plastic Surgery

## 2016-03-20 ENCOUNTER — Other Ambulatory Visit: Payer: Self-pay

## 2016-03-20 DIAGNOSIS — C50112 Malignant neoplasm of central portion of left female breast: Secondary | ICD-10-CM

## 2016-03-20 DIAGNOSIS — N6489 Other specified disorders of breast: Secondary | ICD-10-CM | POA: Diagnosis not present

## 2016-03-20 DIAGNOSIS — Z853 Personal history of malignant neoplasm of breast: Secondary | ICD-10-CM

## 2016-03-20 MED ORDER — VENLAFAXINE HCL ER 37.5 MG PO CP24: 37.5000 mg | ORAL_CAPSULE | Freq: Every day | ORAL | Status: DC

## 2016-03-20 NOTE — Progress Notes (Signed)
Received VM from pt requesting a call back to discuss her medications.  Called pt back to discuss.  Pt reports being on Wellbutrin prior to starting tamoxifen; however, once started on tamoxifen she stopped taking Wellbutrin d/t potential for decrease in effectiveness when taken in combination of tamoxifen.  Pt states she did not want to take anything that would hinder tamoxifen effectiveness.  I informed pt there were other options that were better in combination with tamoxifen.  Prescribed pt Effexor ER 37.5mg  daily per Dr. Lindi Adie.  Pt verbalized understanding and without further questions at this time.  Pt knows to contact us should she have further questions or concerns.

## 2016-03-24 ENCOUNTER — Other Ambulatory Visit: Payer: BLUE CROSS/BLUE SHIELD

## 2016-04-02 DIAGNOSIS — F331 Major depressive disorder, recurrent, moderate: Secondary | ICD-10-CM | POA: Diagnosis not present

## 2016-04-09 ENCOUNTER — Telehealth: Payer: Self-pay | Admitting: Adult Health

## 2016-04-09 NOTE — Telephone Encounter (Signed)
lvm to inform pt of r/s survivorship appt to 8/28 at 930 am per GD

## 2016-04-17 DIAGNOSIS — C50911 Malignant neoplasm of unspecified site of right female breast: Secondary | ICD-10-CM | POA: Diagnosis not present

## 2016-04-18 ENCOUNTER — Encounter: Payer: BLUE CROSS/BLUE SHIELD | Admitting: Adult Health

## 2016-04-18 DIAGNOSIS — C50112 Malignant neoplasm of central portion of left female breast: Secondary | ICD-10-CM | POA: Diagnosis not present

## 2016-04-18 DIAGNOSIS — Z9013 Acquired absence of bilateral breasts and nipples: Secondary | ICD-10-CM | POA: Diagnosis not present

## 2016-04-18 DIAGNOSIS — Z0000 Encounter for general adult medical examination without abnormal findings: Secondary | ICD-10-CM | POA: Diagnosis not present

## 2016-04-25 DIAGNOSIS — M7711 Lateral epicondylitis, right elbow: Secondary | ICD-10-CM | POA: Diagnosis not present

## 2016-04-25 DIAGNOSIS — M25521 Pain in right elbow: Secondary | ICD-10-CM | POA: Diagnosis not present

## 2016-05-05 ENCOUNTER — Ambulatory Visit (HOSPITAL_BASED_OUTPATIENT_CLINIC_OR_DEPARTMENT_OTHER): Payer: BLUE CROSS/BLUE SHIELD | Admitting: Adult Health

## 2016-05-05 ENCOUNTER — Encounter: Payer: Self-pay | Admitting: Adult Health

## 2016-05-05 ENCOUNTER — Telehealth: Payer: Self-pay | Admitting: Genetic Counselor

## 2016-05-05 VITALS — BP 132/77 | HR 80 | Temp 97.8°F | Resp 18 | Ht 67.0 in | Wt 190.6 lb

## 2016-05-05 DIAGNOSIS — Z1589 Genetic susceptibility to other disease: Secondary | ICD-10-CM

## 2016-05-05 DIAGNOSIS — Z1501 Genetic susceptibility to malignant neoplasm of breast: Secondary | ICD-10-CM

## 2016-05-05 DIAGNOSIS — Z1509 Genetic susceptibility to other malignant neoplasm: Secondary | ICD-10-CM

## 2016-05-05 DIAGNOSIS — C50112 Malignant neoplasm of central portion of left female breast: Secondary | ICD-10-CM

## 2016-05-05 DIAGNOSIS — K5900 Constipation, unspecified: Secondary | ICD-10-CM

## 2016-05-05 NOTE — Telephone Encounter (Signed)
Christina [name] let me know that Christina [name] was interested in getting her adult children connected with genetics for genetic testing for the ATM mutation.  I called her to discuss further.  Her three children live in the area.  I left her Christina [name] phone number: [phone] for them to schedule an appt.

## 2016-05-05 NOTE — Progress Notes (Signed)
CLINIC:  Survivorship   REASON FOR VISIT:  Routine follow-up post-treatment for a recent history of breast cancer.  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 p(m)T1b, pN0: Stage IA; RIGHT mastectomy: PASH       01/09/2016 Oncotype testing    Oncotype score: 5; 5% ROR      02/2016 -  Anti-estrogen oral therapy    Tamoxifen 20 mg daily.        INTERVAL HISTORY:  Christina [name] presents to the [facility] today for our initial meeting to review her survivorship care plan detailing her treatment course for breast cancer, as well as monitoring long-term side effects of that treatment, education regarding health maintenance, screening, and overall wellness and health promotion.     Overall, Christina [name] reports feeling quite well since completing her bilateral mastectomy surgery almost 4 months ago.  She started her anti-estrogen therapy with Tamoxifen in 02/2016 and is tolerating it very well thus far.  She denies any hot flashes now; "I had a few when I first started the medicine, but they're pretty much gone now."  She has some vaginal discharge/wetness, but this is not worrisome for her.  She  denies any arthralgias.  She notes some blood on the toilet tissue after a BM. This is occasional and no frank bleeding noted in the stool; no dark/tarry stools. She is occasionally constipated.  She reports that her mother recently underwent genetic testing and found out that she also carries the ATM genetic mutation.  The patient has questions about how to get her children tested.  Ms. [name] also reports financial concerns, as it relates to her medical expenses.  Otherwise, she is largely without complaints.  She is anticipating her plastic surgery in 05/2016 with Dr. Marla Roe.     REVIEW OF SYSTEMS:  Review of Systems  Constitutional: Negative.   HENT: Negative.   Eyes: Negative.   Respiratory: Negative.   Cardiovascular: Negative.   Gastrointestinal: Positive for constipation. Negative for blood in stool and melena.  Genitourinary: Negative.   Musculoskeletal: Negative.  Negative for joint pain.  Skin: Negative.   Neurological: Negative.   Endo/Heme/Allergies: Negative.   Psychiatric/Behavioral: The patient is nervous/anxious.   GU: Denies vaginal bleeding or dryness. Endorses vaginal wetness/stickiness since starting Tamoxifen.  Breast: Denies any new nodularity, masses, tenderness, nipple changes, or nipple discharge.    A 14-point review of systems was completed and was negative, except as noted above.   ONCOLOGY TREATMENT TEAM:  1. Surgeon:  Dr. Barry Dienes at [facility] 2. Medical Oncologist: Dr. Lindi Adie 3. Plastic Surgery: Dr. Audelia Hives    PAST MEDICAL/SURGICAL HISTORY:  Past Medical History:  Diagnosis Date  . Anxiety   . Arthritis    hands and knees  . 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 Depression   . Ovarian cyst    "they come and go" (01/09/2016)  . PONV (postoperative nausea and vomiting)   . Urgency of urination    Past Surgical History:  Procedure Laterality Date  . ABDOMINAL HYSTERECTOMY  05/1996   endometriosis   . ACHILLES TENDON REPAIR Right 2010  . APPENDECTOMY  [id number]   Early mucocele of appendix, removal of small leiomyoma from pelvis, lysis of adhesions  . BREAST BIOPSY Left 10/2015  . BREAST RECONSTRUCTION WITH PLACEMENT OF TISSUE EXPANDER AND FLEX HD (ACELLULAR HYDRATED DERMIS) Bilateral 01/09/2016  . BREAST RECONSTRUCTION WITH PLACEMENT OF TISSUE EXPANDER AND FLEX HD (ACELLULAR HYDRATED DERMIS) Bilateral 01/09/2016   Procedure: BREAST RECONSTRUCTION WITH PLACEMENT OF TISSUE EXPANDER AND FLEX HD (ACELLULAR HYDRATED DERMIS);  Surgeon: Loel Lofty Dillingham, DO;  Location: [facility];  Service: Plastics;  Laterality: Bilateral;  . [place]; 1989; 1991  . INCISION AND DRAINAGE OF WOUND Bilateral 02/11/2016   Procedure: IRRIGATION AND DEBRIDEMENT OF BILATERAL BREAST POCKET;  Surgeon: Wallace Going, DO;  Location: [facility];  Service: Plastics;  Laterality: Bilateral;  . LAPAROSCOPIC CHOLECYSTECTOMY  ~ 1999  . MASTECTOMY Bilateral 01/09/2016  . NIPPLE SPARING MASTECTOMY/SENTINAL LYMPH NODE BIOPSY/RECONSTRUCTION/PLACEMENT OF TISSUE EXPANDER Bilateral 01/09/2016   Procedure: BILATERAL NIPPLE SPARING MASTECTOMY WITH LEFT SENTINAL LYMPH NODE BIOPSY ;  Surgeon: Stark Klein, MD;  Location: [facility];  Service: General;  Laterality: Bilateral;  . REMOVAL OF TISSUE EXPANDER Bilateral 02/11/2016   Procedure: REMOVAL OF BILATERAL TISSUE EXPANDERS AND FLEX HD REMOVAL;  Surgeon: Wallace Going, DO;  Location: [facility];  Service: Plastics;  Laterality: Bilateral;  . TUBAL LIGATION  1991     ALLERGIES:  No Known Allergies   CURRENT MEDICATIONS:  Outpatient Encounter Prescriptions as of 05/05/2016  Medication Sig  . amitriptyline (ELAVIL) 100 MG tablet Take 100 mg by mouth at bedtime.  . Ascorbic Acid (VITAMIN C) 1000 MG tablet Take 1,000 mg by mouth daily.  Marland Kitchen b complex vitamins tablet Take 1 tablet by mouth daily.  . Multiple Vitamin (MULTIVITAMIN) tablet Take 1 tablet  by mouth daily.  . tamoxifen (NOLVADEX) 20 MG tablet Take 1 tablet (20 mg total) by mouth daily.  Marland Kitchen venlafaxine XR (EFFEXOR-XR) 37.5 MG 24 hr capsule Take 1 capsule (37.5 mg total) by mouth daily with breakfast.  . HYDROcodone-acetaminophen (NORCO) 5-325 MG tablet Take 1 tablet by mouth every 6 (six) hours as needed for moderate pain. (Patient not taking: Reported on 05/05/2016)  . [DISCONTINUED] ciprofloxacin (CIPRO) 500 MG tablet Take 500 mg by mouth 2 (two) times daily.  . [DISCONTINUED] sulfamethoxazole-trimethoprim (BACTRIM DS,SEPTRA DS) 800-160 MG tablet Take 1 tablet by mouth 2 (two) times daily.   No facility-administered encounter medications on file as of 05/05/2016.      ONCOLOGIC FAMILY HISTORY:  Family History  Problem Relation Age of Onset  . Heart failure Father   . Prostate cancer Father 69  . Colon polyps Mother     approx 2  . Other Mother     hx HPV and hysterectomy due to precancerous cells  . Other Sister 24    hx of hysterectomy for unspecified reason; still has ovaries  . Other Sister 22    paternal half-sister hx of hysterectomy for unspecified reason; still has ovaries  . Bladder  Cancer Maternal Uncle 63    not a smoker  . Kidney failure Maternal Grandmother   . Congestive Heart Failure Maternal Grandmother   . Colon cancer Maternal Grandmother 75  . Lung cancer Maternal Grandfather 26    smoker  . Breast cancer Paternal Grandmother     dx. early 43s; w/ hx of trauma to breast  . Crohn's disease Daughter   . 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  She has 3 children; 1 son who is 60 and 2 daughters who are 30 and 62 years  old.  She currently works full-time in Teacher, adult education.  She is a former smoker (quit 2009); previously smoked about 1 ppd x 10 years.  She denies any alcohol or illicit drug use.    PHYSICAL EXAMINATION:  Vital Signs:   Vitals:   05/05/16 0909  BP: 132/77  Pulse: 80  Resp: 18  Temp: 97.8 F (36.6 C)   Filed Weights   05/05/16 0909  Weight: 190 lb 9.6 oz (86.5 kg)   General: Well-nourished, well-appearing female in no acute distress.  She is unaccompanied today.   HEENT: Head is normocephalic.  Pupils equal and reactive to light. Conjunctivae clear without exudate.  Sclerae anicteric. Oral mucosa is pink, moist.  Oropharynx is pink without lesions or erythema.  Lymph: No cervical, supraclavicular, or infraclavicular lymphadenopathy noted on palpation.  Cardiovascular: Regular rate and rhythm.Marland Kitchen Respiratory: Clear to auscultation bilaterally. Chest expansion symmetric; breathing non-labored.  GI: Abdomen soft and round; non-tender, non-distended. Bowel sounds normoactive. GU: Deferred.  Neuro: No focal deficits. Steady gait.  Psych: Mood and affect normal and appropriate for situation.  Extremities: No edema. Skin: Warm and dry.  LABORATORY DATA:  None for this visit.  DIAGNOSTIC IMAGING:  None for this visit.      ASSESSMENT AND PLAN:  Ms.. Cervantez is a pleasant 50 y.o. female with Stage IA left breast invasive ductal carcinoma, ATM genetic mutation,  ER+/PR+/HER2-, diagnosed in 10/2015, treated with bilateral mastectomies and anti-estrogen therapy with Tamoxifen beginning in 02/2016.  She presents to the Survivorship Clinic for our initial meeting and routine follow-up post-completion of treatment for breast cancer.    1. Stage IA left breast cancer:  Ms. Silvestro is continuing to recover from definitive treatment for breast cancer.  She has upcoming plastic surgery scheduled for 05/2016.  She will follow-up with her medical oncologist, Dr. Lindi Adie in 05/2016 with history and physical  exam per surveillance protocol.  She will continue her anti-estrogen therapy with Tamoxifen. Thus far, she is tolerating the tamoxifen well, with minimal side effects. She was instructed to make Dr. Lindi Adie or myself aware if she begins to experience any worsening side effects of the medication and I could see her back in clinic to help manage those side effects, as needed. Though the incidence is low, there is an associated risk of endometrial cancer with anti-estrogen therapies like Tamoxifen.  Ms. Theissen was encouraged to contact Dr. Lindi Adie or myself with any vaginal bleeding while taking Tamoxifen. Common side effects of Tamoxifen were again reviewed with her as well. I educated her that the vaginal wetness/mild discharge she is currently experiencing is a common  side effect of Tamoxifen.  Today, a comprehensive survivorship care plan and treatment summary was reviewed with the patient today detailing her breast cancer diagnosis, treatment course, potential late/long-term effects of treatment, appropriate follow-up care with recommendations for the future, and patient education resources.  A copy of this summary, along with a letter will be sent to the patient's primary care provider via mail/fax/In Basket message after today's visit.    2. Constipation:  The intermittent mild bleeding after a BM may be associated with rectal fissure secondary to constipation. She does endorse occasional problems with constipation.  This is likely unrelated to her cancer history and subsequent treatment.  However, I did encourage her to try some OTC stool softeners like Colace or Miralax to see if this may alleviate her symptoms.  I instructed her to follow-up with her PCP if the rectal bleeding continues or becomes worse.  She voiced understanding and agrees with this plan.   3. ATM genetic mutation: We reviewed Ms. Dudas genetic testing report. She understands her risks of future cancer associated with ATM mutations.  Her  mother was recently tested and also carries the ATM mutation. Ms. Tegeler would like her adult children to undergo genetic testing as well and has questions about that process.  I let Ms. Gater know that I would reach out to Jeanine Luz, the genetics counselor who saw the patient and ask for her assistant in facilitating the process to get the patient's children tested.    4. Financial concerns:  Ms. Bigos reported having financial concerns today during our visit.  She is wondering if there are any resources available to help her now.  I shared that so many of the resources available to cancer patients are available during treatment and there are less options once treatment is completed.  However, I did validate her concerns today and shared that I would reach out to our social workers to have them touch base with the patient to see if she may qualify for any assistance.  Ms. Barlowe voiced appreciation.   5. Bone health:  Given Ms. Lundstrom's history of breast cancer, she is at risk for bone demineralization. We do not have a baseline DEXA scan for her available to review.  Since she is likely not post-menopausal as of yet and is not taking an aromatase inhibitor, she may not need DEXA imaging for a few more years.  We discussed that often Tamoxifen provides a positive side effect of increased bone density.  Therefore, I encouratged her to increase her consumption of foods rich in calcium, as well as increase her weight-bearing activities.  She was given education on specific activities to promote bone health.  6. Cancer screening:  Due to Ms. Demuro's history and her age, she should receive screening for skin cancers, colon cancer, and gynecologic cancers.  The information and recommendations are listed on the patient's comprehensive care plan/treatment summary and were reviewed in detail with the patient.    7. Health maintenance and wellness promotion: Ms. Winters was encouraged to consume 5-7 servings of  fruits and vegetables per day. We reviewed the "Nutrition Rainbow" handout, as well as the handout "Recommendations for Nutrition & Physical Activity" from the [facility].  She was also encouraged to engage in moderate to vigorous exercise for 30 minutes per day most days of the week. We discussed the [employer] fitness program, which is designed for cancer survivors to help them become more physically fit after cancer treatments.  She  was instructed to limit her alcohol consumption and continue to abstain from tobacco use.  8. Support services/counseling: It is not uncommon for this period of the patient's cancer care trajectory to be one of many emotions and stressors.  We discussed an opportunity for her to participate in the next session of [facility] ("Finding Your New Normal") support group series designed for patients after they have completed treatment.   Ms. Nobrega was encouraged to take advantage of our many other support services programs, support groups, and/or counseling in coping with her new life as a cancer survivor after completing anti-cancer treatment.  She was offered support today through active listening and expressive supportive counseling.  She was given information regarding our available services and encouraged to contact me with any questions or for help enrolling in any of our support group/programs.    Dispo:   -Return to cancer center to see Dr. Lindi Adie in 05/2016. -She is welcome to return back to the Survivorship Clinic at any time; no additional follow-up needed at this time.  -Consider referral back to survivorship as a long-term survivor for continued surveillance  A total of 30 minutes of face-to-face time was spent with this patient with greater than 50% of that time in counseling and care-coordination.   Mike Craze, NP Survivorship Program [facility] [phone]   Note: PRIMARY CARE PROVIDER Marjorie Smolder,  [place] [phone]

## 2016-05-07 ENCOUNTER — Encounter: Payer: Self-pay | Admitting: *Deleted

## 2016-05-07 NOTE — Progress Notes (Signed)
[place] Work  Clinical Social Work was referred by Engineer, mining for assessment of psychosocial needs due to possible financial concerns.  Clinical Social Worker contacted patient at home to offer support and assess for needs. CSW reviewed breast cancer financial resource options for assistance. Pt has limited access to resources due to not being in active treatment, but due to recent surgeries pt was educated to contact [facility] for possible assistance. Pt plans to contact them and will follow up with CSW after she receives application for assistance. Pt denied other concerns and appreciated call.    Clinical Social Work interventions: Resource education   Loren Racer, [place] Worker [place]  [place] Phone: [phone] Fax: [phone]

## 2016-05-08 DIAGNOSIS — C50912 Malignant neoplasm of unspecified site of left female breast: Secondary | ICD-10-CM | POA: Diagnosis not present

## 2016-05-08 DIAGNOSIS — Z9011 Acquired absence of right breast and nipple: Secondary | ICD-10-CM | POA: Diagnosis not present

## 2016-05-20 NOTE — Assessment & Plan Note (Signed)
Left mastectomy 01/09/2016: Multifocal IDC 0.6cm (grade 2), 0.4, 0.3 cm (grade 1), IG-DCIS and sep focus of HG DCIS; right mastectomy:PASH, 0/4 Lt Axill LN Neg ER/PR HER-2 are being retested Previously E 95%, PR 90% ATM Mutation (risk of breast, colon, pancreatic cancers) Oncotype DX score 5: 5% risk of recurrence with tamoxifen  Current treatment: Tamoxifen 20 mg daily started June 2017 Tamoxifen toxicities:  Return to clinic in 6 months for follow-up

## 2016-05-21 ENCOUNTER — Encounter: Payer: Self-pay | Admitting: Hematology and Oncology

## 2016-05-21 ENCOUNTER — Telehealth: Payer: Self-pay | Admitting: Hematology and Oncology

## 2016-05-21 ENCOUNTER — Ambulatory Visit (HOSPITAL_BASED_OUTPATIENT_CLINIC_OR_DEPARTMENT_OTHER): Payer: BLUE CROSS/BLUE SHIELD | Admitting: Hematology and Oncology

## 2016-05-21 DIAGNOSIS — C50112 Malignant neoplasm of central portion of left female breast: Secondary | ICD-10-CM | POA: Diagnosis not present

## 2016-05-21 MED ORDER — VENLAFAXINE HCL ER 75 MG PO CP24: 75.0000 mg | ORAL_CAPSULE | Freq: Every day | ORAL | 6 refills | Status: DC

## 2016-05-21 NOTE — Progress Notes (Signed)
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, left   Staging form: Breast, AJCC 7th Edition   - Clinical stage from 11/07/2015: Stage 0 (Tis (DCIS), N0, M0) - Unsigned         Staging comments: Staged in breast conference on 3.1.17   - Pathologic stage from 01/09/2016: Stage IA (T1b(m), N0, cM0) - Signed by Holley Bouche, NP on 05/04/2016  SUMMARY OF ONCOLOGIC HISTORY:   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, left   10/29/2015 Mammogram    Screening detected left breast calcifications of irregular 11 mm biopsy DCIS high-grade, central calcifications biopsy was fibrocystic change      10/29/2015 Initial Diagnosis    Left breast biopsy subareolar: DCIS with calcifications, ER 95%, PR 90%, high-grade      11/19/2015 Procedure    Genetic testing: ATM gene mutation, "c.3154-2A>G (IVS21-2A>G)."   Genes analyzed:ATM, BARD1, BRCA1, BRCA2, BRIP1, CDH1, CHEK2, EPCAM, FANCC, MLH1, MSH2, MSH6, NBN, PALB2, PMS2, PTEN, RAD51C, RAD51D, TP53, and XRCC2       01/09/2016 Surgery    Bilateral mastectomy with (L) SLNB ([place]): LEFT mastectomy: 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 p(m)T1b, pN0: Stage IA; RIGHT mastectomy: PASH       01/09/2016 Oncotype testing    Oncotype score: 5; 5% ROR      02/2016 -  Anti-estrogen oral therapy    Tamoxifen 20 mg daily.        CHIEF COMPLIANT: Follow-up on tamoxifen therapy  INTERVAL HISTORY: Christina [name] is a 50 year old with above-mentioned history of left breast cancer who underwent bilateral mastectomies with an ATM mutation. She was low risk Oncotype DX and did not need chemotherapy. She is currently on oral antiestrogen  therapy with tamoxifen that started in June 2017. She is tolerating it extremely well. She reports that initially she had a few hot flashes but they resolved. She does not have any myalgias.  REVIEW OF SYSTEMS:   Constitutional: Denies fevers, chills or abnormal weight loss Eyes: Denies blurriness of vision Ears, nose, mouth, throat, and face: Denies mucositis or sore throat Respiratory: Denies cough, dyspnea or wheezes Cardiovascular: Denies palpitation, chest discomfort Gastrointestinal:  Denies nausea, heartburn or change in bowel habits Skin: Denies abnormal skin rashes Lymphatics: Denies new lymphadenopathy or easy bruising Neurological:Denies numbness, tingling or new weaknesses Behavioral/Psych: Mood is stable, no new changes  Extremities: No lower extremity edema Breast:  denies any pain or lumps or nodules in either breasts All other systems were reviewed with the patient and are negative.  I have reviewed the past medical history, past surgical history, social history and family history with the patient and they are unchanged from previous note.  ALLERGIES:  has No Known Allergies.  MEDICATIONS:  Current Outpatient Prescriptions  Medication Sig Dispense Refill  . amitriptyline (ELAVIL) 100 MG tablet Take 100 mg by mouth at bedtime.    . Ascorbic Acid (VITAMIN C) 1000 MG tablet Take 1,000 mg by mouth daily.    Marland Kitchen b complex vitamins tablet Take 1 tablet by mouth daily.    Marland Kitchen HYDROcodone-acetaminophen (NORCO) 5-325 MG tablet Take 1 tablet by mouth every 6 (six) hours as needed for  moderate pain. (Patient not taking: Reported on 05/05/2016) 30 tablet 0  . Multiple Vitamin (MULTIVITAMIN) tablet Take 1 tablet by mouth daily.    . tamoxifen (NOLVADEX) 20 MG tablet Take 1 tablet (20 mg total) by mouth daily. 30 tablet 12  . venlafaxine XR (EFFEXOR-XR) 37.5 MG 24 hr capsule Take 1 capsule (37.5 mg total) by mouth daily with breakfast. 30 capsule 2   No current facility-administered  medications for this visit.     PHYSICAL EXAMINATION: ECOG PERFORMANCE STATUS: 0 - Asymptomatic  Vitals:   05/21/16 0831  BP: 135/78  Pulse: 71  Resp: 18  Temp: 98.2 F (36.8 C)   Filed Weights   05/21/16 0831  Weight: 188 lb 12.8 oz (85.6 kg)    GENERAL:alert, no distress and comfortable SKIN: skin color, texture, turgor are normal, no rashes or significant lesions EYES: normal, Conjunctiva are pink and non-injected, sclera clear OROPHARYNX:no exudate, no erythema and lips, buccal mucosa, and tongue normal  NECK: supple, thyroid normal size, non-tender, without nodularity LYMPH:  no palpable lymphadenopathy in the cervical, axillary or inguinal LUNGS: clear to auscultation and percussion with normal breathing effort HEART: regular rate & rhythm and no murmurs and no lower extremity edema ABDOMEN:abdomen soft, non-tender and normal bowel sounds MUSCULOSKELETAL:no cyanosis of digits and no clubbing  NEURO: alert & oriented x 3 with fluent speech, no focal motor/sensory deficits EXTREMITIES: No lower extremity edema  LABORATORY DATA:  I have reviewed the data as listed   Chemistry      Component Value Date/Time   NA 136 01/27/2016 2225   NA 142 11/07/2015 0839   K 3.9 01/27/2016 2225   K 4.7 11/07/2015 0839   CL 107 01/27/2016 2225   CO2 22 01/27/2016 2225   CO2 26 11/07/2015 0839   BUN 11 01/27/2016 2225   BUN 12.2 11/07/2015 0839   CREATININE 0.86 01/27/2016 2225   CREATININE 0.9 11/07/2015 0839      Component Value Date/Time   CALCIUM 9.6 01/27/2016 2225   CALCIUM 9.9 11/07/2015 0839   ALKPHOS 54 01/27/2016 2225   ALKPHOS 49 11/07/2015 0839   AST 16 01/27/2016 2225   AST 15 11/07/2015 0839   ALT 18 01/27/2016 2225   ALT 16 11/07/2015 0839   BILITOT 0.4 01/27/2016 2225   BILITOT 0.37 11/07/2015 0839       Lab Results  Component Value Date   WBC 14.2 (H) 01/27/2016   HGB 11.8 (L) 01/27/2016   HCT 37.0 01/27/2016   MCV 86.9 01/27/2016   PLT 376  01/27/2016   NEUTROABS 11.2 (H) 01/27/2016     ASSESSMENT & PLAN:  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, left Left mastectomy 01/09/2016: Multifocal IDC 0.6cm (grade 2), 0.4, 0.3 cm (grade 1), IG-DCIS and sep focus of HG DCIS; right mastectomy:PASH, 0/4 Lt Axill LN Neg ER/PR HER-2 are being retested Previously E 95%, PR 90% ATM Mutation (risk of breast, colon, pancreatic cancers) Oncotype DX score 5: 5% risk of recurrence with tamoxifen  Current treatment: Tamoxifen 20 mg daily started June 2017 Tamoxifen toxicities:Denies any hot flashes or myalgias (patient had a prior hysterectomy)  Return to clinic in 6 months for follow-up for breast exams and surveillance  No orders of the defined types were placed in this encounter.  The patient has a good understanding of the overall plan. she agrees with it. she will call with any problems that may develop before the next visit here.   Rulon Eisenmenger, MD 05/21/16

## 2016-05-21 NOTE — Telephone Encounter (Signed)
appt made and avs printed °

## 2016-05-22 ENCOUNTER — Encounter (HOSPITAL_BASED_OUTPATIENT_CLINIC_OR_DEPARTMENT_OTHER): Payer: Self-pay | Admitting: *Deleted

## 2016-05-27 ENCOUNTER — Ambulatory Visit: Payer: Self-pay | Admitting: Plastic Surgery

## 2016-05-27 DIAGNOSIS — C50919 Malignant neoplasm of unspecified site of unspecified female breast: Secondary | ICD-10-CM

## 2016-05-27 DIAGNOSIS — Z9013 Acquired absence of bilateral breasts and nipples: Secondary | ICD-10-CM

## 2016-05-29 ENCOUNTER — Encounter (HOSPITAL_BASED_OUTPATIENT_CLINIC_OR_DEPARTMENT_OTHER): Payer: Self-pay | Admitting: *Deleted

## 2016-05-29 ENCOUNTER — Encounter (HOSPITAL_BASED_OUTPATIENT_CLINIC_OR_DEPARTMENT_OTHER)
Admission: RE | Disposition: A | Discharge status: Home / Self Care | Payer: Self-pay | Source: Ambulatory Visit | Attending: Plastic Surgery

## 2016-05-29 ENCOUNTER — Ambulatory Visit (HOSPITAL_BASED_OUTPATIENT_CLINIC_OR_DEPARTMENT_OTHER): Payer: BLUE CROSS/BLUE SHIELD | Admitting: Anesthesiology

## 2016-05-29 ENCOUNTER — Ambulatory Visit (HOSPITAL_BASED_OUTPATIENT_CLINIC_OR_DEPARTMENT_OTHER)
Admission: RE | Admit: 2016-05-29 | Discharge: 2016-05-29 | Disposition: A | Discharge status: Home / Self Care | Payer: BLUE CROSS/BLUE SHIELD | Source: Ambulatory Visit | Attending: Plastic Surgery | Admitting: Plastic Surgery

## 2016-05-29 DIAGNOSIS — N83209 Unspecified ovarian cyst, unspecified side: Secondary | ICD-10-CM | POA: Insufficient documentation

## 2016-05-29 DIAGNOSIS — Z801 Family history of malignant neoplasm of trachea, bronchus and lung: Secondary | ICD-10-CM | POA: Diagnosis not present

## 2016-05-29 DIAGNOSIS — Z8052 Family history of malignant neoplasm of bladder: Secondary | ICD-10-CM | POA: Insufficient documentation

## 2016-05-29 DIAGNOSIS — M1990 Unspecified osteoarthritis, unspecified site: Secondary | ICD-10-CM | POA: Diagnosis not present

## 2016-05-29 DIAGNOSIS — Z803 Family history of malignant neoplasm of breast: Secondary | ICD-10-CM | POA: Diagnosis not present

## 2016-05-29 DIAGNOSIS — F329 Major depressive disorder, single episode, unspecified: Secondary | ICD-10-CM | POA: Diagnosis not present

## 2016-05-29 DIAGNOSIS — Z87891 Personal history of nicotine dependence: Secondary | ICD-10-CM | POA: Diagnosis not present

## 2016-05-29 DIAGNOSIS — R3915 Urgency of urination: Secondary | ICD-10-CM | POA: Insufficient documentation

## 2016-05-29 DIAGNOSIS — Z8249 Family history of ischemic heart disease and other diseases of the circulatory system: Secondary | ICD-10-CM | POA: Insufficient documentation

## 2016-05-29 DIAGNOSIS — Z853 Personal history of malignant neoplasm of breast: Secondary | ICD-10-CM | POA: Insufficient documentation

## 2016-05-29 DIAGNOSIS — C50912 Malignant neoplasm of unspecified site of left female breast: Secondary | ICD-10-CM | POA: Diagnosis not present

## 2016-05-29 DIAGNOSIS — F419 Anxiety disorder, unspecified: Secondary | ICD-10-CM | POA: Diagnosis not present

## 2016-05-29 DIAGNOSIS — T85898A Other specified complication of other internal prosthetic devices, implants and grafts, initial encounter: Secondary | ICD-10-CM | POA: Diagnosis not present

## 2016-05-29 DIAGNOSIS — Z9013 Acquired absence of bilateral breasts and nipples: Secondary | ICD-10-CM

## 2016-05-29 DIAGNOSIS — C50919 Malignant neoplasm of unspecified site of unspecified female breast: Secondary | ICD-10-CM

## 2016-05-29 DIAGNOSIS — Z421 Encounter for breast reconstruction following mastectomy: Secondary | ICD-10-CM | POA: Diagnosis not present

## 2016-05-29 HISTORY — PX: BREAST RECONSTRUCTION WITH PLACEMENT OF TISSUE EXPANDER AND FLEX HD (ACELLULAR HYDRATED DERMIS): SHX6295

## 2016-05-29 SURGERY — BREAST RECONSTRUCTION WITH PLACEMENT OF TISSUE EXPANDER AND FLEX HD (ACELLULAR HYDRATED DERMIS)
Anesthesia: General | Site: Chest | Laterality: Bilateral

## 2016-05-29 MED ORDER — LIDOCAINE 2% (20 MG/ML) 5 ML SYRINGE
INTRAMUSCULAR | Status: AC
  Filled 2016-05-29: qty 5

## 2016-05-29 MED ORDER — LIDOCAINE-EPINEPHRINE 1 %-1:100000 IJ SOLN
INTRAMUSCULAR | Status: AC
  Filled 2016-05-29: qty 2

## 2016-05-29 MED ORDER — SUCCINYLCHOLINE CHLORIDE 200 MG/10ML IV SOSY
PREFILLED_SYRINGE | INTRAVENOUS | Status: AC
  Filled 2016-05-29: qty 10

## 2016-05-29 MED ORDER — CHLORHEXIDINE GLUCONATE CLOTH 2 % EX PADS
6.0000 | MEDICATED_PAD | Freq: Once | CUTANEOUS | Status: DC
Start: 2016-05-29 — End: 2016-05-29

## 2016-05-29 MED ORDER — SODIUM CHLORIDE 0.9 % IR SOLN
Status: DC | PRN
  Administered 2016-05-29 (×2): 500 mL

## 2016-05-29 MED ORDER — MIDAZOLAM HCL 2 MG/2ML IJ SOLN
1.0000 mg | INTRAMUSCULAR | Status: DC | PRN
  Administered 2016-05-29: 2 mg via INTRAVENOUS

## 2016-05-29 MED ORDER — ONDANSETRON HCL 4 MG/2ML IJ SOLN
INTRAMUSCULAR | Status: AC
  Filled 2016-05-29: qty 2

## 2016-05-29 MED ORDER — CEFAZOLIN SODIUM-DEXTROSE 2-4 GM/100ML-% IV SOLN
2.0000 g | INTRAVENOUS | Status: AC
Start: 2016-05-29 — End: 2016-05-29
  Administered 2016-05-29: 2 g via INTRAVENOUS

## 2016-05-29 MED ORDER — DEXAMETHASONE SODIUM PHOSPHATE 10 MG/ML IJ SOLN
INTRAMUSCULAR | Status: AC
  Filled 2016-05-29: qty 1

## 2016-05-29 MED ORDER — FENTANYL CITRATE (PF) 100 MCG/2ML IJ SOLN
50.0000 ug | INTRAMUSCULAR | Status: AC | PRN
  Administered 2016-05-29: 25 ug via INTRAVENOUS
  Administered 2016-05-29: 50 ug via INTRAVENOUS
  Administered 2016-05-29: 100 ug via INTRAVENOUS
  Administered 2016-05-29 (×3): 25 ug via INTRAVENOUS

## 2016-05-29 MED ORDER — SUCCINYLCHOLINE CHLORIDE 20 MG/ML IJ SOLN
INTRAMUSCULAR | Status: DC | PRN
  Administered 2016-05-29: 100 mg via INTRAVENOUS

## 2016-05-29 MED ORDER — BACITRACIN-NEOMYCIN-POLYMYXIN 400-5-5000 EX OINT
TOPICAL_OINTMENT | CUTANEOUS | Status: AC
  Filled 2016-05-29: qty 2

## 2016-05-29 MED ORDER — FENTANYL CITRATE (PF) 100 MCG/2ML IJ SOLN
INTRAMUSCULAR | Status: AC
  Filled 2016-05-29: qty 2

## 2016-05-29 MED ORDER — HYDROMORPHONE HCL 1 MG/ML IJ SOLN
INTRAMUSCULAR | Status: AC
  Filled 2016-05-29: qty 1

## 2016-05-29 MED ORDER — PROPOFOL 10 MG/ML IV BOLUS
INTRAVENOUS | Status: AC
  Filled 2016-05-29: qty 20

## 2016-05-29 MED ORDER — DEXAMETHASONE SODIUM PHOSPHATE 4 MG/ML IJ SOLN
INTRAMUSCULAR | Status: DC | PRN
  Administered 2016-05-29: 10 mg via INTRAVENOUS

## 2016-05-29 MED ORDER — PROPOFOL 10 MG/ML IV BOLUS
INTRAVENOUS | Status: AC
Start: 2016-05-29 — End: 2016-05-29
  Filled 2016-05-29: qty 20

## 2016-05-29 MED ORDER — PROPOFOL 10 MG/ML IV BOLUS
INTRAVENOUS | Status: DC | PRN
  Administered 2016-05-29: 50 mg via INTRAVENOUS
  Administered 2016-05-29: 150 mg via INTRAVENOUS

## 2016-05-29 MED ORDER — CEFAZOLIN SODIUM-DEXTROSE 2-4 GM/100ML-% IV SOLN
INTRAVENOUS | Status: AC
  Filled 2016-05-29: qty 100

## 2016-05-29 MED ORDER — CHLORHEXIDINE GLUCONATE CLOTH 2 % EX PADS: 6.0000 | MEDICATED_PAD | Freq: Once | CUTANEOUS | Status: DC

## 2016-05-29 MED ORDER — PHENYLEPHRINE 40 MCG/ML (10ML) SYRINGE FOR IV PUSH (FOR BLOOD PRESSURE SUPPORT)
PREFILLED_SYRINGE | INTRAVENOUS | Status: AC
  Filled 2016-05-29: qty 10

## 2016-05-29 MED ORDER — SCOPOLAMINE 1 MG/3DAYS TD PT72
MEDICATED_PATCH | TRANSDERMAL | Status: AC
  Filled 2016-05-29: qty 1

## 2016-05-29 MED ORDER — SCOPOLAMINE 1 MG/3DAYS TD PT72
1.0000 | MEDICATED_PATCH | Freq: Once | TRANSDERMAL | Status: DC | PRN
  Administered 2016-05-29: 1.5 mg via TRANSDERMAL

## 2016-05-29 MED ORDER — BUPIVACAINE-EPINEPHRINE (PF) 0.25% -1:200000 IJ SOLN
INTRAMUSCULAR | Status: AC
  Filled 2016-05-29: qty 120

## 2016-05-29 MED ORDER — GLYCOPYRROLATE 0.2 MG/ML IJ SOLN: 0.2000 mg | Freq: Once | INTRAMUSCULAR | Status: DC | PRN

## 2016-05-29 MED ORDER — EPHEDRINE 5 MG/ML INJ
INTRAVENOUS | Status: AC
  Filled 2016-05-29: qty 10

## 2016-05-29 MED ORDER — ATROPINE SULFATE 0.4 MG/ML IV SOSY
PREFILLED_SYRINGE | INTRAVENOUS | Status: AC
  Filled 2016-05-29: qty 2.5

## 2016-05-29 MED ORDER — MIDAZOLAM HCL 2 MG/2ML IJ SOLN
INTRAMUSCULAR | Status: AC
  Filled 2016-05-29: qty 2

## 2016-05-29 MED ORDER — HYDROMORPHONE HCL 1 MG/ML IJ SOLN
0.2500 mg | INTRAMUSCULAR | Status: DC | PRN
  Administered 2016-05-29 (×4): 0.5 mg via INTRAVENOUS

## 2016-05-29 MED ORDER — ARTIFICIAL TEARS OP OINT
TOPICAL_OINTMENT | OPHTHALMIC | Status: AC
  Filled 2016-05-29: qty 3.5

## 2016-05-29 MED ORDER — DIPHENHYDRAMINE HCL 50 MG/ML IJ SOLN
INTRAMUSCULAR | Status: AC
  Filled 2016-05-29: qty 1

## 2016-05-29 MED ORDER — BUPIVACAINE-EPINEPHRINE 0.25% -1:200000 IJ SOLN
INTRAMUSCULAR | Status: DC | PRN
  Administered 2016-05-29: 13 mL

## 2016-05-29 MED ORDER — LIDOCAINE HCL (CARDIAC) 20 MG/ML IV SOLN
INTRAVENOUS | Status: DC | PRN
  Administered 2016-05-29: 20 mg via INTRAVENOUS
  Administered 2016-05-29: 60 mg via INTRAVENOUS

## 2016-05-29 MED ORDER — LACTATED RINGERS IV SOLN
INTRAVENOUS | Status: DC
  Administered 2016-05-29 (×3): via INTRAVENOUS

## 2016-05-29 SURGICAL SUPPLY — 63 items
BAG DECANTER FOR FLEXI CONT (MISCELLANEOUS) ×2 IMPLANT
BINDER BREAST LRG (GAUZE/BANDAGES/DRESSINGS) IMPLANT
BINDER BREAST MEDIUM (GAUZE/BANDAGES/DRESSINGS) IMPLANT
BINDER BREAST XLRG (GAUZE/BANDAGES/DRESSINGS) ×2 IMPLANT
BINDER BREAST XXLRG (GAUZE/BANDAGES/DRESSINGS) IMPLANT
BIOPATCH RED 1 DISK 7.0 (GAUZE/BANDAGES/DRESSINGS) ×4 IMPLANT
BLADE HEX COATED 2.75 (ELECTRODE) ×2 IMPLANT
BLADE SURG 15 STRL LF DISP TIS (BLADE) ×2 IMPLANT
BLADE SURG 15 STRL SS (BLADE) ×2
BNDG GAUZE ELAST 4 BULKY (GAUZE/BANDAGES/DRESSINGS) ×4 IMPLANT
CANISTER SUCT 1200ML W/VALVE (MISCELLANEOUS) ×2 IMPLANT
CHLORAPREP W/TINT 26ML (MISCELLANEOUS) ×2 IMPLANT
COVER BACK TABLE 60X90IN (DRAPES) ×2 IMPLANT
COVER MAYO STAND STRL (DRAPES) ×2 IMPLANT
DECANTER SPIKE VIAL GLASS SM (MISCELLANEOUS) IMPLANT
DERMABOND ADVANCED (GAUZE/BANDAGES/DRESSINGS) ×2
DERMABOND ADVANCED .7 DNX12 (GAUZE/BANDAGES/DRESSINGS) ×2 IMPLANT
DRAIN CHANNEL 19F RND (DRAIN) ×4 IMPLANT
DRAPE LAPAROSCOPIC ABDOMINAL (DRAPES) ×2 IMPLANT
DRSG PAD ABDOMINAL 8X10 ST (GAUZE/BANDAGES/DRESSINGS) ×4 IMPLANT
ELECT BLADE 4.0 EZ CLEAN MEGAD (MISCELLANEOUS) ×2
ELECT REM PT RETURN 9FT ADLT (ELECTROSURGICAL) ×2
ELECTRODE BLDE 4.0 EZ CLN MEGD (MISCELLANEOUS) ×1 IMPLANT
ELECTRODE REM PT RTRN 9FT ADLT (ELECTROSURGICAL) ×1 IMPLANT
EVACUATOR SILICONE 100CC (DRAIN) ×4 IMPLANT
GLOVE BIO SURGEON STRL SZ 6.5 (GLOVE) ×8 IMPLANT
GLOVE BIOGEL PI IND STRL 7.0 (GLOVE) ×3 IMPLANT
GLOVE BIOGEL PI INDICATOR 7.0 (GLOVE) ×3
GLOVE ECLIPSE 6.5 STRL STRAW (GLOVE) ×4 IMPLANT
GOWN STRL REUS W/ TWL LRG LVL3 (GOWN DISPOSABLE) ×5 IMPLANT
GOWN STRL REUS W/TWL LRG LVL3 (GOWN DISPOSABLE) ×5
GRAFT FLEX HD BILAT 4X16 THICK (Tissue Mesh) ×4 IMPLANT
IMPL BREAST TIS EXP M 350CC (Breast) ×2 IMPLANT
IMPLANT BREAST TIS EXP M 350CC (Breast) ×4 IMPLANT
IV NS 1000ML (IV SOLUTION) ×1
IV NS 1000ML BAXH (IV SOLUTION) ×1 IMPLANT
IV NS 500ML (IV SOLUTION)
IV NS 500ML BAXH (IV SOLUTION) IMPLANT
KIT FILL SYSTEM UNIVERSAL (SET/KITS/TRAYS/PACK) IMPLANT
NEEDLE HYPO 25X1 1.5 SAFETY (NEEDLE) ×2 IMPLANT
NS IRRIG 1000ML POUR BTL (IV SOLUTION) ×4 IMPLANT
PACK BASIN DAY SURGERY FS (CUSTOM PROCEDURE TRAY) ×2 IMPLANT
PENCIL BUTTON HOLSTER BLD 10FT (ELECTRODE) ×2 IMPLANT
PIN SAFETY STERILE (MISCELLANEOUS) ×2 IMPLANT
SET ASEPTIC TRANSFER (MISCELLANEOUS) ×4 IMPLANT
SLEEVE SCD COMPRESS KNEE MED (MISCELLANEOUS) ×2 IMPLANT
SPONGE LAP 18X18 X RAY DECT (DISPOSABLE) ×6 IMPLANT
SUT MNCRL AB 4-0 PS2 18 (SUTURE) ×4 IMPLANT
SUT MON AB 3-0 SH 27 (SUTURE) ×2
SUT MON AB 3-0 SH27 (SUTURE) ×2 IMPLANT
SUT MON AB 5-0 PS2 18 (SUTURE) ×4 IMPLANT
SUT PDS 3-0 CT2 (SUTURE)
SUT PDS AB 2-0 CT2 27 (SUTURE) ×16 IMPLANT
SUT PDS II 3-0 CT2 27 ABS (SUTURE) IMPLANT
SUT SILK 3 0 PS 1 (SUTURE) ×4 IMPLANT
SUT VIC AB 3-0 SH 27 (SUTURE)
SUT VIC AB 3-0 SH 27X BRD (SUTURE) IMPLANT
SYR BULB IRRIGATION 50ML (SYRINGE) ×2 IMPLANT
SYR CONTROL 10ML LL (SYRINGE) ×2 IMPLANT
TOWEL OR 17X24 6PK STRL BLUE (TOWEL DISPOSABLE) ×4 IMPLANT
TUBE CONNECTING 20X1/4 (TUBING) ×2 IMPLANT
UNDERPAD 30X30 (UNDERPADS AND DIAPERS) ×4 IMPLANT
YANKAUER SUCT BULB TIP NO VENT (SUCTIONS) ×2 IMPLANT

## 2016-05-29 NOTE — Op Note (Signed)
Op report    DATE OF OPERATION:  05/29/2016  LOCATION: [place]  SURGICAL DIVISION: Plastic Surgery  PREOPERATIVE DIAGNOSES:  1. Acquired absence of bilateral breasts. 2. History of Breast cancer.    POSTOPERATIVE DIAGNOSES:  1. Acquired absence of bilateral breasts. 2. History of Breast cancer.    PROCEDURE:  1. Bilateral breast reconstruction with placement of Acellular Dermal Matrix and tissue expanders. 2. Capsulectomy of left breast pocket  SURGEON: Micheale Schlack Sanger Bronsen Serano, DO  ASSISTANT: Shawn Rayburn, PA  ANESTHESIA:  General.   COMPLICATIONS: None.   IMPLANTS: Left - Mentor 350cc. Ref [id number].  Serial Number [id number]  100 cc of saline placed in the expander Right - Mentor 350cc. Ref [id number].  Serial Number [id number]  100 cc of saline placed in the expander Acellular Dermal Matrix 4 x 16 cm (two)  INDICATIONS FOR PROCEDURE:  The patient, Christina [name], is a 50 y.o. female born on 1966/01/04, is here for breast reconstruction with placement of bilateral tissue expander and Acellular dermal matrix.  She had expanders placed in the past and had infection with removal of the expanders. MRN: [id number]  CONSENT:  Informed consent was obtained directly from the patient. Risks, benefits and alternatives were fully discussed. Specific risks including but not limited to bleeding, infection, hematoma, seroma, scarring, pain, implant infection, implant extrusion, capsular contracture, asymmetry, wound healing problems, and need for further surgery were all discussed. The patient did have an ample opportunity to have her questions answered to her satisfaction.   DESCRIPTION OF PROCEDURE:  The patient was taken to the operating room. SCDs were placed and IV antibiotics were given. The patient's chest was prepped and draped in a sterile fashion. A time out was performed and the implants to be used were identified.  Once the general surgery team had  completed their portion of the case the patient was rendered to the plastic and reconstructive surgery team.  Right:  The incision from the previous surgery was utilized.  A #15 blade was used to open the incision.  The bovie was used to lift the pectoralis major muscle from the chest wall with release of the lateral edge and lateral inframammary fold.  There was significant scar tissue.  The muscle was then freed from the flap.  The pocket was irrigated with antibiotic solution and hemostasis was achieved with electrocautery.  The ADM was then prepared according to the manufacture guidelines and slits placed to help with postoperative fluid management.  The ADM was then sutured to the inferior and lateral edge of the inframammary fold with 2-0 PDS starting with an interrupted stitch and then a running stitch.  The lateral portion was sutured to with interrupted sutures after the expander was placed.  The expander was prepared according to the manufacture guidelines, the air evacuated and then it was placed under the ADM and pectoralis major muscle.  The inferior and lateral tabs were used to secure the expander to the chest wall with 2-0 PDS.  The drain was placed at the inframammary fold over the ADM and secured to the skin with 3-0 Silk.    Left: The incision from the previous surgery was utilized.  A #15 blade was used to open the incision.  There was a pocket between the pectoralis muscle and the chest wall.  The bovie was used free the pectoralis from the skin flap.  The excess capsule was excised and sent to pathology.  The pocket was irrigated with antibiotic solution and  hemostasis was achieved with electrocautery.  The ADM was then prepared according to the manufacture guidelines and slits placed to help with postoperative fluid management.  The ADM was then sutured to the inferior and lateral edge of the inframammary fold with 2-0 PDS starting with an interrupted stitch and then a running stitch.  The  lateral portion was sutured to with interrupted sutures after the expander was placed.  The expander was prepared according to the manufacture guidelines, the air evacuated and then it was placed under the ADM and pectoralis major muscle.  The inferior and lateral tabs were used to secure the expander to the chest wall with 2-0 PDS.  The drain was placed at the inframammary fold over the ADM and secured to the skin with 3-0 Silk.  The deep layers were closed with 3-0 Monocryl followed by 4-0 Monocryl.  The skin was closed with 5-0 Monocryl and then dermabond was applied.  The ABDs and breast binder were placed.  The patient tolerated the procedure well and there were no complications.  The patient was allowed to wake from anesthesia and taken to the recovery room in satisfactory condition.

## 2016-05-29 NOTE — Discharge Instructions (Addendum)
Sink showers No heavy lifting Binder or sports bra  Call your surgeon if you experience:   1.  Fever over 101.0. 2.  Inability to urinate. 3.  Nausea and/or vomiting. 4.  Extreme swelling or bruising at the surgical site. 5.  Continued bleeding from the incision. 6.  Increased pain, redness or drainage from the incision. 7.  Problems related to your pain medication. 8.  Any problems and/or concerns  About my Jackson-Pratt Bulb Drain  What is a Jackson-Pratt bulb? A Jackson-Pratt is a soft, round device used to collect drainage. It is connected to a long, thin drainage catheter, which is held in place by one or two small stiches near your surgical incision site. When the bulb is squeezed, it forms a vacuum, forcing the drainage to empty into the bulb.  Emptying the Jackson-Pratt bulb- To empty the bulb: 1. Release the plug on the top of the bulb. 2. Pour the bulb's contents into a measuring container which your nurse will provide. 3. Record the time emptied and amount of drainage. Empty the drain(s) as often as your     doctor or nurse recommends.  Date                  Time                    Amount (Drain 1)                 Amount (Drain 2)  _____________________________________________________________________  _____________________________________________________________________  _____________________________________________________________________  _____________________________________________________________________  _____________________________________________________________________  _____________________________________________________________________  _____________________________________________________________________  _____________________________________________________________________  Squeezing the Jackson-Pratt Bulb- To squeeze the bulb: 1. Make sure the plug at the top of the bulb is open. 2. Squeeze the bulb tightly in your fist. You will hear air  squeezing from the bulb. 3. Replace the plug while the bulb is squeezed. 4. Use a safety pin to attach the bulb to your clothing. This will keep the catheter from     pulling at the bulb insertion site.  When to call your doctor- Call your doctor if:  Drain site becomes red, swollen or hot.  You have a fever greater than 101 degrees F.  There is oozing at the drain site.  Drain falls out (apply a guaze bandage over the drain hole and secure it with tape).  Drainage increases daily not related to activity patterns. (You will usually have more drainage when you are active than when you are resting.)  Drainage has a bad odor.   Post Anesthesia Home Care Instructions  Activity: Get plenty of rest for the remainder of the day. A responsible adult should stay with you for 24 hours following the procedure.  For the next 24 hours, DO NOT: -Drive a car -Paediatric nurse -Drink alcoholic beverages -Take any medication unless instructed by your physician -Make any legal decisions or sign important papers.  Meals: Start with liquid foods such as gelatin or soup. Progress to regular foods as tolerated. Avoid greasy, spicy, heavy foods. If nausea and/or vomiting occur, drink only clear liquids until the nausea and/or vomiting subsides. Call your physician if vomiting continues.  Special Instructions/Symptoms: Your throat may feel dry or sore from the anesthesia or the breathing tube placed in your throat during surgery. If this causes discomfort, gargle with warm salt water. The discomfort should disappear within 24 hours.  If you had a scopolamine patch placed behind your ear for the management of post- operative nausea and/or vomiting:  1.  The medication in the patch is effective for 72 hours, after which it should be removed.  Wrap patch in a tissue and discard in the trash. Wash hands thoroughly with soap and water. 2. You may remove the patch earlier than 72 hours if you experience  unpleasant side effects which may include dry mouth, dizziness or visual disturbances. 3. Avoid touching the patch. Wash your hands with soap and water after contact with the patch.

## 2016-05-29 NOTE — Brief Op Note (Signed)
05/29/2016  7:39 AM  PATIENT:  Christina [name]  50 y.o. female  PRE-OPERATIVE DIAGNOSIS:  left breast cancer  POST-OPERATIVE DIAGNOSIS:  same  PROCEDURE:  Procedure(s): PLACEMENT OF BILATERAL TISSUE EXPANDER AND FLEX HD (ACELLULAR HYDRATED DERMIS) (Bilateral)  SURGEON:  Surgeon(s) and Role:    * Loel Lofty Dillingham, DO - Primary  PHYSICIAN ASSISTANT: Shawn Rayburn, PA  ASSISTANTS: none   ANESTHESIA:   general  EBL:  No intake/output data recorded.  BLOOD ADMINISTERED:none  DRAINS: (2) Jackson-Pratt drain(s) with closed bulb suction in the breast pocket   LOCAL MEDICATIONS USED:  MARCAINE     SPECIMEN:  No Specimen  DISPOSITION OF SPECIMEN:  N/A  COUNTS:  YES  TOURNIQUET:  * No tourniquets in log *  DICTATION: .Dragon Dictation  PLAN OF CARE: Discharge to home after PACU  PATIENT DISPOSITION:  PACU - hemodynamically stable.   Delay start of Pharmacological VTE agent (>24hrs) due to surgical blood loss or risk of bleeding: no

## 2016-05-29 NOTE — H&P (Signed)
Christina [name] is an 50 y.o. female.   Chief Complaint: acquired absence of bilateral breasts HPI: The patient is a 50 yrs old wf here for pre operative history and physical prior to placement of tissue expanders and ADM for bilateral breast reconstruction. She underwent bilateral nipple sparing mastectomies with immediate reconstruction using expanders and FlexHD on 01/09/16. She developed MSSA/Acinetobacter infections in bilateral breast pockets and bilateral expanders and ADM were removed on 02/11/16. She has been cleared for surgery to replace the expanders at this time and resume breast reconstruction at this time.   History: She went for a screening mammogram and was found to have calcifications of the LEFT breast in the subareolar location. The upper central location was also positive for calcifications and fibrocystic disease. 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 She is 5 feet 7 inches tall, weighs 189 pounds, Preop = 36 C. Her genetics was positive. She quit smoking in 2009. She had mild ptosis of her breasts.  Past Medical History:  Diagnosis Date  . Anxiety   . Arthritis    hands and knees  . 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 Depression   . Ovarian cyst    "they come and go" (01/09/2016)  . PONV (postoperative nausea and vomiting)   . Urgency of urination     Past Surgical History:  Procedure Laterality Date  . ABDOMINAL HYSTERECTOMY  05/1996   endometriosis  . ACHILLES TENDON REPAIR Right 2010  . APPENDECTOMY  [id number]   Early mucocele of appendix, removal of small leiomyoma from pelvis, lysis of adhesions  . BREAST BIOPSY Left 10/2015  . BREAST RECONSTRUCTION WITH PLACEMENT OF TISSUE EXPANDER AND FLEX HD (ACELLULAR HYDRATED DERMIS) Bilateral 01/09/2016  . BREAST RECONSTRUCTION WITH PLACEMENT OF TISSUE EXPANDER AND FLEX HD (ACELLULAR HYDRATED DERMIS) Bilateral 01/09/2016   Procedure: BREAST RECONSTRUCTION  WITH PLACEMENT OF TISSUE EXPANDER AND FLEX HD (ACELLULAR HYDRATED DERMIS);  Surgeon: Loel Lofty Dillingham, DO;  Location: [place];  Service: Plastics;  Laterality: Bilateral;  . [place]; 1989; 1991  . INCISION AND DRAINAGE OF WOUND Bilateral 02/11/2016   Procedure: IRRIGATION AND DEBRIDEMENT OF BILATERAL BREAST POCKET;  Surgeon: Wallace Going, DO;  Location: [facility];  Service: Plastics;  Laterality: Bilateral;  . LAPAROSCOPIC CHOLECYSTECTOMY  ~ 1999  . MASTECTOMY Bilateral 01/09/2016  . NIPPLE SPARING MASTECTOMY/SENTINAL LYMPH NODE BIOPSY/RECONSTRUCTION/PLACEMENT OF TISSUE EXPANDER Bilateral 01/09/2016   Procedure: BILATERAL NIPPLE SPARING MASTECTOMY WITH LEFT SENTINAL LYMPH NODE BIOPSY ;  Surgeon: Stark Klein, MD;  Location: [place];  Service: General;  Laterality: Bilateral;  . REMOVAL OF TISSUE EXPANDER Bilateral 02/11/2016   Procedure: REMOVAL OF BILATERAL TISSUE EXPANDERS AND FLEX HD REMOVAL;  Surgeon: Wallace Going, DO;  Location: [facility];  Service: Plastics;  Laterality: Bilateral;  . TUBAL LIGATION  1991    Family History  Problem Relation Age of Onset  . Heart failure Father   . Prostate cancer Father 43  . Colon polyps Mother     approx 2  . Other Mother     hx HPV and hysterectomy due to precancerous cells  . Other Sister 78    hx of hysterectomy for unspecified reason; still has ovaries  . Other Sister 33    paternal half-sister hx of hysterectomy for unspecified reason; still has ovaries  . Bladder Cancer Maternal Uncle 79    not a smoker  . Kidney failure  Maternal Grandmother   . Congestive Heart Failure Maternal Grandmother   . Colon cancer Maternal Grandmother 2  . Lung cancer Maternal Grandfather 60    smoker  . Breast cancer Paternal Grandmother     dx. early 47s; w/ hx of trauma to breast  . Crohn's disease Daughter   . Lung cancer Maternal Uncle 31    smoker   Social History:  reports that she quit smoking  about 7 years ago. Her smoking use included Cigarettes. She has a 10.00 pack-year smoking history. She has never used smokeless tobacco. She reports that she does not drink alcohol or use drugs.  Allergies: No Known Allergies  Medications Prior to Admission  Medication Sig Dispense Refill  . amitriptyline (ELAVIL) 100 MG tablet Take 100 mg by mouth at bedtime.    . Ascorbic Acid (VITAMIN C) 1000 MG tablet Take 1,000 mg by mouth daily.    Marland Kitchen b complex vitamins tablet Take 1 tablet by mouth daily.    . Multiple Vitamin (MULTIVITAMIN) tablet Take 1 tablet by mouth daily.    . tamoxifen (NOLVADEX) 20 MG tablet Take 1 tablet (20 mg total) by mouth daily. 30 tablet 12  . venlafaxine XR (EFFEXOR-XR) 75 MG 24 hr capsule Take 1 capsule (75 mg total) by mouth daily with breakfast. 30 capsule 6  . HYDROcodone-acetaminophen (NORCO) 5-325 MG tablet Take 1 tablet by mouth every 6 (six) hours as needed for moderate pain. (Patient not taking: Reported on 05/22/2016) 30 tablet 0    No results found for this or any previous visit (from the past 48 hour(s)). No results found.  Review of Systems  Constitutional: Negative.   HENT: Negative.   Eyes: Negative.   Respiratory: Negative.   Cardiovascular: Negative.   Gastrointestinal: Negative.   Genitourinary: Negative.   Musculoskeletal: Negative.   Skin: Negative.   Neurological: Negative.   Psychiatric/Behavioral: Negative.     Blood pressure 119/78, pulse 69, temperature 98.2 F (36.8 C), temperature source Oral, resp. rate 18, height 5\' 7"  (1.702 m), weight 85.3 kg (188 lb), SpO2 100 %. Physical Exam  Constitutional: She appears well-developed and well-nourished.  HENT:  Head: Normocephalic and atraumatic.  Eyes: EOM are normal. Pupils are equal, round, and reactive to light.  Cardiovascular: Normal rate.   Respiratory: Effort normal.  GI: Soft. She exhibits no distension. There is no tenderness.  Neurological: She is alert.  Skin: Skin is warm.   Psychiatric: She has a normal mood and affect. Her behavior is normal. Judgment and thought content normal.     Assessment/Plan Bilateral reconstruction with expanders and flex HD.  Wallace Going, DO 05/29/2016, 7:23 AM

## 2016-05-29 NOTE — Anesthesia Preprocedure Evaluation (Addendum)
Anesthesia Evaluation  Patient identified by MRN, date of birth, ID band Patient awake    Reviewed: Allergy & Precautions, H&P , NPO status , Patient's Chart, lab work & pertinent test results  History of Anesthesia Complications (+) PONV  Airway Mallampati: II  TM Distance: >3 FB Neck ROM: Full    Dental no notable dental hx. (+) Teeth Intact, Dental Advisory Given   Pulmonary neg pulmonary ROS, former smoker,    Pulmonary exam normal breath sounds clear to auscultation       Cardiovascular negative cardio ROS   Rhythm:Regular Rate:Normal     Neuro/Psych Anxiety Depression negative neurological ROS     GI/Hepatic negative GI ROS, Neg liver ROS,   Endo/Other  negative endocrine ROS  Renal/GU negative Renal ROS  negative genitourinary   Musculoskeletal  (+) Arthritis , Osteoarthritis,    Abdominal   Peds  Hematology negative hematology ROS (+)   Anesthesia Other Findings   Reproductive/Obstetrics negative OB ROS                            Anesthesia Physical Anesthesia Plan  ASA: II  Anesthesia Plan: General   Post-op Pain Management:    Induction: Intravenous  Airway Management Planned: LMA and Oral ETT  Additional Equipment:   Intra-op Plan:   Post-operative Plan: Extubation in OR  Informed Consent: I have reviewed the patients History and Physical, chart, labs and discussed the procedure including the risks, benefits and alternatives for the proposed anesthesia with the patient or authorized representative who has indicated his/her understanding and acceptance.   Dental advisory given  Plan Discussed with: CRNA  Anesthesia Plan Comments:         Anesthesia Quick Evaluation

## 2016-05-29 NOTE — Transfer of Care (Signed)
Immediate Anesthesia Transfer of Care Note  Patient: Christina [name]  Procedure(s) Performed: Procedure(s): PLACEMENT OF BILATERAL TISSUE EXPANDER AND FLEX HD (ACELLULAR HYDRATED DERMIS) (Bilateral)  Patient Location: PACU  Anesthesia Type:General  Level of Consciousness: awake, alert  and oriented  Airway & Oxygen Therapy: Patient Spontanous Breathing and Patient connected to face mask oxygen  Post-op Assessment: Report given to RN and Post -op Vital signs reviewed and stable  Post vital signs: Reviewed and stable  Last Vitals:  Vitals:   05/29/16 1017 05/29/16 1019  BP: 132/73   Pulse: 91 89  Resp:  (!) 27  Temp:      Last Pain:  Vitals:   05/29/16 0645  TempSrc: Oral         Complications: No apparent anesthesia complications

## 2016-05-29 NOTE — Anesthesia Procedure Notes (Signed)
Procedure Name: Intubation Date/Time: 05/29/2016 7:31 AM Performed by: Melynda Ripple D Pre-anesthesia Checklist: Patient identified, Emergency Drugs available, Suction available and Patient being monitored Patient Re-evaluated:Patient Re-evaluated prior to inductionOxygen Delivery Method: Circle system utilized Preoxygenation: Pre-oxygenation with 100% oxygen Intubation Type: IV induction Ventilation: Mask ventilation without difficulty Laryngoscope Size: Mac and 3 Grade View: Grade II Tube type: Oral Tube size: 7.0 mm Number of attempts: 1 Airway Equipment and Method: Stylet and Oral airway Placement Confirmation: ETT inserted through vocal cords under direct vision,  positive ETCO2 and breath sounds checked- equal and bilateral Secured at: 23 cm Tube secured with: Tape Dental Injury: Teeth and Oropharynx as per pre-operative assessment

## 2016-05-29 NOTE — Anesthesia Postprocedure Evaluation (Signed)
Anesthesia Post Note  Patient: Christina [name]  Procedure(s) Performed: Procedure(s) (LRB): PLACEMENT OF BILATERAL TISSUE EXPANDER AND FLEX HD (ACELLULAR HYDRATED DERMIS) (Bilateral)  Patient location during evaluation: PACU Anesthesia Type: General Level of consciousness: awake and alert Pain management: pain level controlled Vital Signs Assessment: post-procedure vital signs reviewed and stable Respiratory status: spontaneous breathing, nonlabored ventilation and respiratory function stable Cardiovascular status: blood pressure returned to baseline and stable Postop Assessment: no signs of nausea or vomiting Anesthetic complications: no    Last Vitals:  Vitals:   05/29/16 1145 05/29/16 1200  BP: 133/76 125/80  Pulse: 85 86  Resp: 18 15  Temp:      Last Pain:  Vitals:   05/29/16 1145  TempSrc:   PainSc: 6                  Landan Fedie,W. EDMOND

## 2016-05-30 ENCOUNTER — Encounter (HOSPITAL_BASED_OUTPATIENT_CLINIC_OR_DEPARTMENT_OTHER): Payer: Self-pay | Admitting: Plastic Surgery

## 2016-06-10 DIAGNOSIS — C50912 Malignant neoplasm of unspecified site of left female breast: Secondary | ICD-10-CM | POA: Diagnosis not present

## 2016-06-18 DIAGNOSIS — Z0000 Encounter for general adult medical examination without abnormal findings: Secondary | ICD-10-CM | POA: Diagnosis not present

## 2016-06-18 DIAGNOSIS — Z13220 Encounter for screening for lipoid disorders: Secondary | ICD-10-CM | POA: Diagnosis not present

## 2016-06-18 DIAGNOSIS — Z23 Encounter for immunization: Secondary | ICD-10-CM | POA: Diagnosis not present

## 2016-07-02 DIAGNOSIS — C50912 Malignant neoplasm of unspecified site of left female breast: Secondary | ICD-10-CM | POA: Diagnosis not present

## 2016-07-02 DIAGNOSIS — Z9011 Acquired absence of right breast and nipple: Secondary | ICD-10-CM | POA: Diagnosis not present

## 2016-08-11 DIAGNOSIS — Z6831 Body mass index (BMI) 31.0-31.9, adult: Secondary | ICD-10-CM | POA: Diagnosis not present

## 2016-08-11 DIAGNOSIS — Z713 Dietary counseling and surveillance: Secondary | ICD-10-CM | POA: Diagnosis not present

## 2016-08-14 ENCOUNTER — Encounter (HOSPITAL_BASED_OUTPATIENT_CLINIC_OR_DEPARTMENT_OTHER): Payer: Self-pay | Admitting: *Deleted

## 2016-08-18 ENCOUNTER — Ambulatory Visit: Payer: Self-pay | Admitting: Plastic Surgery

## 2016-08-18 DIAGNOSIS — Z853 Personal history of malignant neoplasm of breast: Secondary | ICD-10-CM

## 2016-08-18 DIAGNOSIS — Z9013 Acquired absence of bilateral breasts and nipples: Secondary | ICD-10-CM

## 2016-08-18 NOTE — H&P (Signed)
Christina [name] is an 50 y.o. female.   Chief Complaint: acquired absence of breasts, breast cancer history HPI: The patient is a 50 yrs old wf here for pre operative history and physical prior to secondary breast reconstruction surgery with removal of bilateral tissue expanders and placement of bilateral silicone implants. She underwent bilateral nipple sparing mastectomies with immediate reconstruction using expanders and FlexHD on 01/09/16. She developed MSSA/Acinetobacter infections in bilateral breast pockets and bilateral expanders and ADM were removed on 02/11/16. She was treated with appropriate antibiotics and showed resolution and she then underwent replacement of bilateral tissue expanders and ADM for bilateralbreast reconstruction on 05/29/16.   She has 420/350 cc bilaterally in her TE.   History: She went for a screening mammogram and was found to have calcifications of the LEFT breast in the subareolar location. The upper central location was also positive for calcifications and fibrocystic disease. 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 She is 5 feet 7 inches tall, weighs 189 pounds, Preop = 36 C. Her genetics was positive. She quit smoking in 2009. She had mild ptosis of her breasts.  Past Medical History:  Diagnosis Date  . Anxiety   . Arthritis    hands and knees  . 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 Depression   . Ovarian cyst    "they come and go" (01/09/2016)  . PONV (postoperative nausea and vomiting)   . Urgency of urination     Past Surgical History:  Procedure Laterality Date  . ABDOMINAL HYSTERECTOMY  05/1996   endometriosis  . ACHILLES TENDON REPAIR Right 2010  . APPENDECTOMY  [id number]   Early mucocele of appendix, removal of small leiomyoma from pelvis, lysis of adhesions  . BREAST BIOPSY Left 10/2015  . BREAST RECONSTRUCTION WITH PLACEMENT OF TISSUE EXPANDER AND FLEX HD (ACELLULAR  HYDRATED DERMIS) Bilateral 01/09/2016  . BREAST RECONSTRUCTION WITH PLACEMENT OF TISSUE EXPANDER AND FLEX HD (ACELLULAR HYDRATED DERMIS) Bilateral 01/09/2016   Procedure: BREAST RECONSTRUCTION WITH PLACEMENT OF TISSUE EXPANDER AND FLEX HD (ACELLULAR HYDRATED DERMIS);  Surgeon: Loel Lofty Allyn Bartelson, DO;  Location: [place];  Service: Plastics;  Laterality: Bilateral;  . BREAST RECONSTRUCTION WITH PLACEMENT OF TISSUE EXPANDER AND FLEX HD (ACELLULAR HYDRATED DERMIS) Bilateral 05/29/2016   Procedure: PLACEMENT OF BILATERAL TISSUE EXPANDER AND FLEX HD (ACELLULAR HYDRATED DERMIS);  Surgeon: Wallace Going, DO;  Location: [place];  Service: Plastics;  Laterality: Bilateral;  . [place]; 1989; 1991  . INCISION AND DRAINAGE OF WOUND Bilateral 02/11/2016   Procedure: IRRIGATION AND DEBRIDEMENT OF BILATERAL BREAST POCKET;  Surgeon: Wallace Going, DO;  Location: [facility] [place];  Service: Plastics;  Laterality: Bilateral;  . LAPAROSCOPIC CHOLECYSTECTOMY  ~ 1999  . MASTECTOMY Bilateral 01/09/2016  . NIPPLE SPARING MASTECTOMY/SENTINAL LYMPH NODE BIOPSY/RECONSTRUCTION/PLACEMENT OF TISSUE EXPANDER Bilateral 01/09/2016   Procedure: BILATERAL NIPPLE SPARING MASTECTOMY WITH LEFT SENTINAL LYMPH NODE BIOPSY ;  Surgeon: Stark Klein, MD;  Location: [place];  Service: General;  Laterality: Bilateral;  . REMOVAL OF TISSUE EXPANDER Bilateral 02/11/2016   Procedure: REMOVAL OF BILATERAL TISSUE EXPANDERS AND FLEX HD REMOVAL;  Surgeon: Wallace Going, DO;  Location: [place];  Service: Plastics;  Laterality: Bilateral;  . TUBAL LIGATION  1991    Family History  Problem Relation Age of Onset  . Heart failure Father   . Prostate cancer Father 44  . Colon polyps Mother  approx 2  . Other Mother     hx HPV and hysterectomy due to precancerous cells  . Other Sister 81    hx of hysterectomy for unspecified reason; still has ovaries  . Other Sister 71    paternal  half-sister hx of hysterectomy for unspecified reason; still has ovaries  . Bladder Cancer Maternal Uncle 79    not a smoker  . Kidney failure Maternal Grandmother   . Congestive Heart Failure Maternal Grandmother   . Colon cancer Maternal Grandmother 24  . Lung cancer Maternal Grandfather 26    smoker  . Breast cancer Paternal Grandmother     dx. early 24s; w/ hx of trauma to breast  . Crohn's disease Daughter   . Lung cancer Maternal Uncle 71    smoker   Social History:  reports that she quit smoking about 8 years ago. Her smoking use included Cigarettes. She has a 10.00 pack-year smoking history. She has never used smokeless tobacco. She reports that she does not drink alcohol or use drugs.  Allergies: No Known Allergies   (Not in a hospital admission)  No results found for this or any previous visit (from the past 48 hour(s)). No results found.  Review of Systems  Constitutional: Negative.   HENT: Negative.   Eyes: Negative.   Respiratory: Negative.   Cardiovascular: Negative.   Gastrointestinal: Negative.   Genitourinary: Negative.   Musculoskeletal: Negative.   Skin: Negative.   Neurological: Negative.   Psychiatric/Behavioral: Negative.     There were no vitals taken for this visit. Physical Exam  Constitutional: She is oriented to person, place, and time. She appears well-developed and well-nourished.  HENT:  Head: Normocephalic and atraumatic.  Eyes: EOM are normal. Pupils are equal, round, and reactive to light.  Cardiovascular: Normal rate.   Respiratory: Effort normal.  GI: Soft.  Neurological: She is alert and oriented to person, place, and time.  Skin: Skin is warm. No rash noted. No erythema.  Psychiatric: She has a normal mood and affect. Her behavior is normal. Judgment and thought content normal.     Assessment/Plan Removal of bilateral breast expanders and placement of silicone implants.  Wallace Going, DO 08/18/2016, 8:04 PM

## 2016-08-20 ENCOUNTER — Ambulatory Visit (HOSPITAL_BASED_OUTPATIENT_CLINIC_OR_DEPARTMENT_OTHER)
Admission: RE | Admit: 2016-08-20 | Discharge: 2016-08-20 | Disposition: A | Discharge status: Home / Self Care | Payer: BLUE CROSS/BLUE SHIELD | Source: Ambulatory Visit | Attending: Plastic Surgery | Admitting: Plastic Surgery

## 2016-08-20 ENCOUNTER — Ambulatory Visit (HOSPITAL_BASED_OUTPATIENT_CLINIC_OR_DEPARTMENT_OTHER): Payer: BLUE CROSS/BLUE SHIELD | Admitting: Anesthesiology

## 2016-08-20 ENCOUNTER — Encounter (HOSPITAL_BASED_OUTPATIENT_CLINIC_OR_DEPARTMENT_OTHER)
Admission: RE | Disposition: A | Discharge status: Home / Self Care | Payer: Self-pay | Source: Ambulatory Visit | Attending: Plastic Surgery

## 2016-08-20 ENCOUNTER — Encounter (HOSPITAL_BASED_OUTPATIENT_CLINIC_OR_DEPARTMENT_OTHER): Payer: Self-pay | Admitting: *Deleted

## 2016-08-20 DIAGNOSIS — M1990 Unspecified osteoarthritis, unspecified site: Secondary | ICD-10-CM | POA: Diagnosis not present

## 2016-08-20 DIAGNOSIS — Z853 Personal history of malignant neoplasm of breast: Secondary | ICD-10-CM | POA: Diagnosis not present

## 2016-08-20 DIAGNOSIS — Z9013 Acquired absence of bilateral breasts and nipples: Secondary | ICD-10-CM | POA: Diagnosis not present

## 2016-08-20 DIAGNOSIS — Z45812 Encounter for adjustment or removal of left breast implant: Secondary | ICD-10-CM | POA: Diagnosis not present

## 2016-08-20 DIAGNOSIS — Z803 Family history of malignant neoplasm of breast: Secondary | ICD-10-CM | POA: Diagnosis not present

## 2016-08-20 DIAGNOSIS — Z45811 Encounter for adjustment or removal of right breast implant: Secondary | ICD-10-CM | POA: Diagnosis not present

## 2016-08-20 DIAGNOSIS — Z87891 Personal history of nicotine dependence: Secondary | ICD-10-CM | POA: Insufficient documentation

## 2016-08-20 DIAGNOSIS — Z421 Encounter for breast reconstruction following mastectomy: Secondary | ICD-10-CM | POA: Insufficient documentation

## 2016-08-20 DIAGNOSIS — F419 Anxiety disorder, unspecified: Secondary | ICD-10-CM | POA: Diagnosis not present

## 2016-08-20 DIAGNOSIS — C50912 Malignant neoplasm of unspecified site of left female breast: Secondary | ICD-10-CM | POA: Diagnosis not present

## 2016-08-20 DIAGNOSIS — T8579XA Infection and inflammatory reaction due to other internal prosthetic devices, implants and grafts, initial encounter: Secondary | ICD-10-CM | POA: Diagnosis not present

## 2016-08-20 HISTORY — PX: REMOVAL OF BILATERAL TISSUE EXPANDERS WITH PLACEMENT OF BILATERAL BREAST IMPLANTS: SHX6431

## 2016-08-20 SURGERY — REMOVAL, TISSUE EXPANDER, BREAST, BILATERAL, WITH BILATERAL IMPLANT IMPLANT INSERTION
Anesthesia: General | Site: Breast | Laterality: Bilateral

## 2016-08-20 MED ORDER — MIDAZOLAM HCL 2 MG/2ML IJ SOLN
INTRAMUSCULAR | Status: AC
  Filled 2016-08-20: qty 2

## 2016-08-20 MED ORDER — FENTANYL CITRATE (PF) 100 MCG/2ML IJ SOLN
50.0000 ug | INTRAMUSCULAR | Status: AC | PRN
  Administered 2016-08-20: 50 ug via INTRAVENOUS
  Administered 2016-08-20: 100 ug via INTRAVENOUS
  Administered 2016-08-20 (×2): 25 ug via INTRAVENOUS

## 2016-08-20 MED ORDER — SODIUM CHLORIDE 0.9 % IR SOLN
Status: DC | PRN
  Administered 2016-08-20: 500 mL

## 2016-08-20 MED ORDER — LIDOCAINE HCL (CARDIAC) 20 MG/ML IV SOLN
INTRAVENOUS | Status: DC | PRN
  Administered 2016-08-20: 100 mg via INTRAVENOUS

## 2016-08-20 MED ORDER — BUPIVACAINE-EPINEPHRINE 0.25% -1:200000 IJ SOLN
INTRAMUSCULAR | Status: DC | PRN
  Administered 2016-08-20: 8 mL

## 2016-08-20 MED ORDER — SCOPOLAMINE 1 MG/3DAYS TD PT72
1.0000 | MEDICATED_PATCH | Freq: Once | TRANSDERMAL | Status: DC | PRN
  Administered 2016-08-20: 1.5 mg via TRANSDERMAL

## 2016-08-20 MED ORDER — SUGAMMADEX SODIUM 200 MG/2ML IV SOLN
INTRAVENOUS | Status: DC | PRN
  Administered 2016-08-20: 200 mg via INTRAVENOUS

## 2016-08-20 MED ORDER — DEXAMETHASONE SODIUM PHOSPHATE 4 MG/ML IJ SOLN
INTRAMUSCULAR | Status: DC | PRN
  Administered 2016-08-20: 10 mg via INTRAVENOUS

## 2016-08-20 MED ORDER — SUGAMMADEX SODIUM 200 MG/2ML IV SOLN
INTRAVENOUS | Status: AC
  Filled 2016-08-20: qty 2

## 2016-08-20 MED ORDER — HYDROMORPHONE HCL 1 MG/ML IJ SOLN
0.2500 mg | INTRAMUSCULAR | Status: DC | PRN
  Administered 2016-08-20: 0.5 mg via INTRAVENOUS
  Administered 2016-08-20: 0.25 mg via INTRAVENOUS
  Administered 2016-08-20: 0.5 mg via INTRAVENOUS

## 2016-08-20 MED ORDER — ONDANSETRON HCL 4 MG/2ML IJ SOLN
INTRAMUSCULAR | Status: AC
  Filled 2016-08-20: qty 2

## 2016-08-20 MED ORDER — MIDAZOLAM HCL 2 MG/2ML IJ SOLN
1.0000 mg | INTRAMUSCULAR | Status: DC | PRN
  Administered 2016-08-20: 2 mg via INTRAVENOUS

## 2016-08-20 MED ORDER — DEXAMETHASONE SODIUM PHOSPHATE 10 MG/ML IJ SOLN
INTRAMUSCULAR | Status: AC
  Filled 2016-08-20: qty 1

## 2016-08-20 MED ORDER — FENTANYL CITRATE (PF) 100 MCG/2ML IJ SOLN
INTRAMUSCULAR | Status: AC
  Filled 2016-08-20: qty 2

## 2016-08-20 MED ORDER — LIDOCAINE 2% (20 MG/ML) 5 ML SYRINGE
INTRAMUSCULAR | Status: AC
  Filled 2016-08-20: qty 5

## 2016-08-20 MED ORDER — CEFAZOLIN SODIUM-DEXTROSE 2-4 GM/100ML-% IV SOLN
INTRAVENOUS | Status: AC
  Filled 2016-08-20: qty 100

## 2016-08-20 MED ORDER — PROPOFOL 10 MG/ML IV BOLUS
INTRAVENOUS | Status: DC | PRN
  Administered 2016-08-20: 200 mg via INTRAVENOUS

## 2016-08-20 MED ORDER — CEFAZOLIN SODIUM-DEXTROSE 2-4 GM/100ML-% IV SOLN
2.0000 g | INTRAVENOUS | Status: AC
  Administered 2016-08-20: 2 g via INTRAVENOUS

## 2016-08-20 MED ORDER — HYDROMORPHONE HCL 1 MG/ML IJ SOLN
INTRAMUSCULAR | Status: AC
  Filled 2016-08-20: qty 1

## 2016-08-20 MED ORDER — ONDANSETRON HCL 4 MG/2ML IJ SOLN
INTRAMUSCULAR | Status: DC | PRN
  Administered 2016-08-20: 4 mg via INTRAVENOUS

## 2016-08-20 MED ORDER — LACTATED RINGERS IV SOLN
INTRAVENOUS | Status: DC
  Administered 2016-08-20 (×2): via INTRAVENOUS

## 2016-08-20 MED ORDER — ROCURONIUM BROMIDE 100 MG/10ML IV SOLN
INTRAVENOUS | Status: DC | PRN
  Administered 2016-08-20: 50 mg via INTRAVENOUS

## 2016-08-20 MED ORDER — SCOPOLAMINE 1 MG/3DAYS TD PT72
MEDICATED_PATCH | TRANSDERMAL | Status: AC
  Filled 2016-08-20: qty 1

## 2016-08-20 SURGICAL SUPPLY — 67 items
BAG DECANTER FOR FLEXI CONT (MISCELLANEOUS) ×2 IMPLANT
BINDER BREAST LRG (GAUZE/BANDAGES/DRESSINGS) IMPLANT
BINDER BREAST MEDIUM (GAUZE/BANDAGES/DRESSINGS) IMPLANT
BINDER BREAST XLRG (GAUZE/BANDAGES/DRESSINGS) ×2 IMPLANT
BINDER BREAST XXLRG (GAUZE/BANDAGES/DRESSINGS) IMPLANT
BIOPATCH RED 1 DISK 7.0 (GAUZE/BANDAGES/DRESSINGS) IMPLANT
BLADE HEX COATED 2.75 (ELECTRODE) ×2 IMPLANT
BLADE SURG 15 STRL LF DISP TIS (BLADE) ×2 IMPLANT
BLADE SURG 15 STRL SS (BLADE) ×2
BNDG GAUZE ELAST 4 BULKY (GAUZE/BANDAGES/DRESSINGS) ×4 IMPLANT
CANISTER SUCT 1200ML W/VALVE (MISCELLANEOUS) ×4 IMPLANT
CHLORAPREP W/TINT 26ML (MISCELLANEOUS) ×2 IMPLANT
CORDS BIPOLAR (ELECTRODE) IMPLANT
COVER BACK TABLE 60X90IN (DRAPES) ×2 IMPLANT
COVER MAYO STAND STRL (DRAPES) ×2 IMPLANT
DECANTER SPIKE VIAL GLASS SM (MISCELLANEOUS) IMPLANT
DERMABOND ADVANCED (GAUZE/BANDAGES/DRESSINGS) ×2
DERMABOND ADVANCED .7 DNX12 (GAUZE/BANDAGES/DRESSINGS) ×2 IMPLANT
DRAIN CHANNEL 19F RND (DRAIN) IMPLANT
DRAPE LAPAROSCOPIC ABDOMINAL (DRAPES) ×2 IMPLANT
DRSG PAD ABDOMINAL 8X10 ST (GAUZE/BANDAGES/DRESSINGS) ×4 IMPLANT
ELECT BLADE 4.0 EZ CLEAN MEGAD (MISCELLANEOUS) ×2
ELECT REM PT RETURN 9FT ADLT (ELECTROSURGICAL) ×2
ELECTRODE BLDE 4.0 EZ CLN MEGD (MISCELLANEOUS) ×1 IMPLANT
ELECTRODE REM PT RTRN 9FT ADLT (ELECTROSURGICAL) ×1 IMPLANT
EVACUATOR SILICONE 100CC (DRAIN) IMPLANT
GLOVE BIO SURGEON STRL SZ 6 (GLOVE) IMPLANT
GLOVE BIO SURGEON STRL SZ 6.5 (GLOVE) ×8 IMPLANT
GLOVE BIOGEL PI IND STRL 7.0 (GLOVE) ×2 IMPLANT
GLOVE BIOGEL PI INDICATOR 7.0 (GLOVE) ×2
GLOVE ECLIPSE 6.5 STRL STRAW (GLOVE) ×2 IMPLANT
GOWN STRL REUS W/ TWL LRG LVL3 (GOWN DISPOSABLE) ×3 IMPLANT
GOWN STRL REUS W/TWL LRG LVL3 (GOWN DISPOSABLE) ×3
IMPL BREAST SMOOTH UH 455CC (Breast) ×2 IMPLANT
IMPLANT BREAST SMOOTH UH 455CC (Breast) ×4 IMPLANT
IV NS 1000ML (IV SOLUTION)
IV NS 1000ML BAXH (IV SOLUTION) IMPLANT
IV NS 500ML (IV SOLUTION)
IV NS 500ML BAXH (IV SOLUTION) IMPLANT
KIT FILL SYSTEM UNIVERSAL (SET/KITS/TRAYS/PACK) IMPLANT
NDL SAFETY ECLIPSE 18X1.5 (NEEDLE) ×1 IMPLANT
NEEDLE HYPO 18GX1.5 SHARP (NEEDLE) ×1
NEEDLE HYPO 25X1 1.5 SAFETY (NEEDLE) ×2 IMPLANT
PACK BASIN DAY SURGERY FS (CUSTOM PROCEDURE TRAY) ×2 IMPLANT
PENCIL BUTTON HOLSTER BLD 10FT (ELECTRODE) ×2 IMPLANT
PIN SAFETY STERILE (MISCELLANEOUS) IMPLANT
SIZER BREAST REUSE 455CC (SIZER) ×2
SIZER BRST REUSE 455CC (SIZER) ×1 IMPLANT
SLEEVE SCD COMPRESS KNEE MED (MISCELLANEOUS) ×2 IMPLANT
SPONGE GAUZE 4X4 12PLY STER LF (GAUZE/BANDAGES/DRESSINGS) IMPLANT
SPONGE LAP 18X18 X RAY DECT (DISPOSABLE) ×6 IMPLANT
SUT MNCRL AB 4-0 PS2 18 (SUTURE) ×4 IMPLANT
SUT MON AB 3-0 SH 27 (SUTURE) ×2
SUT MON AB 3-0 SH27 (SUTURE) ×2 IMPLANT
SUT MON AB 5-0 PS2 18 (SUTURE) ×4 IMPLANT
SUT PDS 3-0 CT2 (SUTURE) ×2
SUT PDS AB 2-0 CT2 27 (SUTURE) IMPLANT
SUT PDS II 3-0 CT2 27 ABS (SUTURE) ×1 IMPLANT
SUT VIC AB 3-0 SH 27 (SUTURE)
SUT VIC AB 3-0 SH 27X BRD (SUTURE) IMPLANT
SUT VICRYL 4-0 PS2 18IN ABS (SUTURE) IMPLANT
SYR BULB IRRIGATION 50ML (SYRINGE) ×2 IMPLANT
SYR CONTROL 10ML LL (SYRINGE) IMPLANT
TOWEL OR 17X24 6PK STRL BLUE (TOWEL DISPOSABLE) ×8 IMPLANT
TUBE CONNECTING 20X1/4 (TUBING) ×2 IMPLANT
UNDERPAD 30X30 (UNDERPADS AND DIAPERS) ×4 IMPLANT
YANKAUER SUCT BULB TIP NO VENT (SUCTIONS) ×2 IMPLANT

## 2016-08-20 NOTE — Anesthesia Postprocedure Evaluation (Signed)
Anesthesia Post Note  Patient: Christina [name] [facility]  Procedure(s) Performed: Procedure(s) (LRB): REMOVAL OF BILATERAL TISSUE EXPANDERS WITH PLACEMENT OF BILATERAL SILICONE IMPLANTS (Bilateral)  Patient location during evaluation: PACU Anesthesia Type: General Level of consciousness: awake Pain management: pain level controlled Respiratory status: spontaneous breathing Cardiovascular status: stable Anesthetic complications: no    Last Vitals:  Vitals:   08/20/16 1300 08/20/16 1335  BP: 132/77 126/78  Pulse: 85 86  Resp: 18 18  Temp:  37.2 C    Last Pain:  Vitals:   08/20/16 1335  TempSrc:   PainSc: 3                  Christina [name]

## 2016-08-20 NOTE — Transfer of Care (Signed)
Immediate Anesthesia Transfer of Care Note  Patient: Christina [name]  Procedure(s) Performed: Procedure(s): REMOVAL OF BILATERAL TISSUE EXPANDERS WITH PLACEMENT OF BILATERAL SILICONE IMPLANTS (Bilateral)  Patient Location: PACU  Anesthesia Type:General  Level of Consciousness: sedated, patient cooperative and responds to stimulation  Airway & Oxygen Therapy: Patient Spontanous Breathing and Patient connected to face mask oxygen  Post-op Assessment: Report given to RN and Post -op Vital signs reviewed and stable  Post vital signs: Reviewed and stable  Last Vitals:  Vitals:   08/20/16 0841  BP: 122/78  Pulse: 78  Resp: 20  Temp: 36.8 C    Last Pain:  Vitals:   08/20/16 0841  TempSrc: Oral         Complications: No apparent anesthesia complications

## 2016-08-20 NOTE — Discharge Instructions (Signed)
Can shower on Friday Continue binder No heavy lifting    Post Anesthesia Home Care Instructions  Activity: Get plenty of rest for the remainder of the day. A responsible adult should stay with you for 24 hours following the procedure.  For the next 24 hours, DO NOT: -Drive a car -Paediatric nurse -Drink alcoholic beverages -Take any medication unless instructed by your physician -Make any legal decisions or sign important papers.  Meals: Start with liquid foods such as gelatin or soup. Progress to regular foods as tolerated. Avoid greasy, spicy, heavy foods. If nausea and/or vomiting occur, drink only clear liquids until the nausea and/or vomiting subsides. Call your physician if vomiting continues.  Special Instructions/Symptoms: Your throat may feel dry or sore from the anesthesia or the breathing tube placed in your throat during surgery. If this causes discomfort, gargle with warm salt water. The discomfort should disappear within 24 hours.  If you had a scopolamine patch placed behind your ear for the management of post- operative nausea and/or vomiting:  1. The medication in the patch is effective for 72 hours, after which it should be removed.  Wrap patch in a tissue and discard in the trash. Wash hands thoroughly with soap and water. 2. You may remove the patch earlier than 72 hours if you experience unpleasant side effects which may include dry mouth, dizziness or visual disturbances. 3. Avoid touching the patch. Wash your hands with soap and water after contact with the patch.

## 2016-08-20 NOTE — Anesthesia Preprocedure Evaluation (Signed)
Anesthesia Evaluation  Patient identified by MRN, date of birth, ID band Patient awake    Reviewed: Allergy & Precautions, NPO status , Patient's Chart, lab work & pertinent test results  History of Anesthesia Complications (+) PONV  Airway Mallampati: II  TM Distance: >3 FB     Dental   Pulmonary neg pulmonary ROS, former smoker,    breath sounds clear to auscultation       Cardiovascular negative cardio ROS   Rhythm:Regular Rate:Normal     Neuro/Psych    GI/Hepatic negative GI ROS, Neg liver ROS,   Endo/Other  negative endocrine ROS  Renal/GU negative Renal ROS     Musculoskeletal  (+) Arthritis ,   Abdominal   Peds  Hematology   Anesthesia Other Findings   Reproductive/Obstetrics                             Anesthesia Physical Anesthesia Plan  ASA: II  Anesthesia Plan: General   Post-op Pain Management:    Induction: Intravenous  Airway Management Planned: Oral ETT  Additional Equipment:   Intra-op Plan:   Post-operative Plan: Extubation in OR  Informed Consent: I have reviewed the patients History and Physical, chart, labs and discussed the procedure including the risks, benefits and alternatives for the proposed anesthesia with the patient or authorized representative who has indicated his/her understanding and acceptance.   Dental advisory given  Plan Discussed with: CRNA and Anesthesiologist  Anesthesia Plan Comments:         Anesthesia Quick Evaluation

## 2016-08-20 NOTE — Op Note (Signed)
Op report Bilateral Exchange   DATE OF OPERATION: 08/20/2016  LOCATION: [place]  SURGICAL DIVISION: Plastic Surgery  PREOPERATIVE DIAGNOSES:  1.History of breast cancer.  2. Acquired absence of bilateral breast.   POSTOPERATIVE DIAGNOSES:  1. History of breast cancer.  2. Acquired absence of bilateral breast.   PROCEDURE:  1. Bilateral exchange of tissue expanders for implants.  2. Bilateral capsulotomies for implant respositioning.  SURGEON: Claire Sanger Dillingham, DO  ASSISTANT: Shawn Rayburn, PA  ANESTHESIA:  General.   COMPLICATIONS: None.   IMPLANTS: Left - Mentor Smooth Round High Profile Gel 455cc. Ref [id number].  Serial Number [id number] Right - Mentor Smooth Round High Profile Gel 455cc. Ref [id number].  Serial Number [id number]  INDICATIONS FOR PROCEDURE:  The patient, Christina [name], is a 50 y.o. female born on 12/10/1965, is here for treatment after bilateral mastectomies.  She had tissue expanders placed at the time of mastectomies. She now presents for exchange of her expanders for implants.  She requires capsulotomies to better position the implants. MRN: [id number]  CONSENT:  Informed consent was obtained directly from the patient. Risks, benefits and alternatives were fully discussed. Specific risks including but not limited to bleeding, infection, hematoma, seroma, scarring, pain, implant infection, implant extrusion, capsular contracture, asymmetry, wound healing problems, and need for further surgery were all discussed. The patient did have an ample opportunity to have her questions answered to her satisfaction.   DESCRIPTION OF PROCEDURE:  The patient was taken to the operating room. SCDs were placed and IV antibiotics were given. The patient's chest was prepped and draped in a sterile fashion. A time out was performed and the implants to be used were identified.    On the right breast: One percent Lidocaine with epinephrine  was used to infiltrate at the incision site. The old mastectomy scar was excised.  The mastectomy flaps from the superior and inferior flaps were raised over the pectoralis major muscle for several centimeters to minimize tension for the closure. The pectoralis was split inferior to the skin incision to expose and remove the tissue expander.  Inspection of the pocket showed a normal healthy capsule and good integration of the biologic matrix.  The pocket was irrigated with antibiotic solution.  The capsule was freed from the NAC to decrease the crease.  Circumferential capsulotomies and lateral capsulectomies were performed to allow for breast pocket expansion.  Measurements were made and a sizer used to confirm adequate pocket size for the implant dimensions.  Hemostasis was ensured with electrocautery. New gloves were placed. The implant was soaked in antibiotic solution and then placed in the pocket and oriented appropriately. The pectoralis major muscle and capsule on the anterior surface were re-closed with a 3-0 Monocryl suture. The remaining skin was closed with 4-0 Monocryl deep dermal and 5-0 Monocryl subcuticular stitches.   On the left breast: The old mastectomy scar was excised.  The mastectomy flaps from the superior and inferior flaps were raised over the pectoralis major muscle for several centimeters to minimize tension for the closure. The pectoralis was split inferior to the skin incision to expose and remove the tissue expander.  Inspection of the pocket showed a normal healthy capsule and good integration of the biologic matrix.   Circumferential capsulotomies and lateral capsulectomies were performed to allow for breast pocket expansion.  Measurements were made and a sizer utilized to confirm adequate pocket size for the implant dimensions.  Hemostasis was ensured with the electrocautery.  New gloves were  applied. The implant was soaked in antibiotic solution and placed in the pocket and  oriented appropriately. The pectoralis major muscle and capsule on the anterior surface were re-closed with a 3-0 Monocryl suture. The remaining skin was closed with 4-0 Monocryl deep dermal and 5-0 Monocryl subcuticular stitches.  Dermabond was applied to the incision site. A breast binder and ABDs were placed.  The patient was allowed to wake from anesthesia and taken to the recovery room in satisfactory condition.

## 2016-08-20 NOTE — Brief Op Note (Signed)
08/20/2016  12:03 PM  PATIENT:  Christina [name]  50 y.o. female  PRE-OPERATIVE DIAGNOSIS:  HISTORY OF LEFT BREAST CANCER  POST-OPERATIVE DIAGNOSIS:  same  PROCEDURE:  Procedure(s): REMOVAL OF BILATERAL TISSUE EXPANDERS WITH PLACEMENT OF BILATERAL SILICONE IMPLANTS (Bilateral)  SURGEON:  Surgeon(s) and Role:    * Tyrihanna Wingert S [place], DO - Primary  PHYSICIAN ASSISTANT: Shawn Rayburn, PA  ASSISTANTS: none   ANESTHESIA:   general  EBL:  Total I/O In: 1000 [I.V.:1000] Out: -   BLOOD ADMINISTERED:none  DRAINS: none   LOCAL MEDICATIONS USED:  marcaine  SPECIMEN:  Source of Specimen:  bilateral breast capsule  DISPOSITION OF SPECIMEN:  PATHOLOGY  COUNTS:  YES  TOURNIQUET:  * No tourniquets in log *  DICTATION: .Dragon Dictation  PLAN OF CARE: Discharge to home after PACU  PATIENT DISPOSITION:  PACU - hemodynamically stable.   Delay start of Pharmacological VTE agent (>24hrs) due to surgical blood loss or risk of bleeding: no

## 2016-08-20 NOTE — H&P (View-Only) (Signed)
Christina [name] is an 50 y.o. female.   Chief Complaint: acquired absence of breasts, breast cancer history HPI: The patient is a 50 yrs old wf here for pre operative history and physical prior to secondary breast reconstruction surgery with removal of bilateral tissue expanders and placement of bilateral silicone implants. She underwent bilateral nipple sparing mastectomies with immediate reconstruction using expanders and FlexHD on 01/09/16. She developed MSSA/Acinetobacter infections in bilateral breast pockets and bilateral expanders and ADM were removed on 02/11/16. She was treated with appropriate antibiotics and showed resolution and she then underwent replacement of bilateral tissue expanders and ADM for bilateralbreast reconstruction on 05/29/16.   She has 420/350 cc bilaterally in her TE.   History: She went for a screening mammogram and was found to have calcifications of the LEFT breast in the subareolar location. The upper central location was also positive for calcifications and fibrocystic disease. 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 She is 5 feet 7 inches tall, weighs 189 pounds, Preop = 36 C. Her genetics was positive. She quit smoking in 2009. She had mild ptosis of her breasts.  Past Medical History:  Diagnosis Date  . Anxiety   . Arthritis    hands and knees  . 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 Depression   . Ovarian cyst    "they come and go" (01/09/2016)  . PONV (postoperative nausea and vomiting)   . Urgency of urination     Past Surgical History:  Procedure Laterality Date  . ABDOMINAL HYSTERECTOMY  05/1996   endometriosis  . ACHILLES TENDON REPAIR Right 2010  . APPENDECTOMY  [id number]   Early mucocele of appendix, removal of small leiomyoma from pelvis, lysis of adhesions  . BREAST BIOPSY Left 10/2015  . BREAST RECONSTRUCTION WITH PLACEMENT OF TISSUE EXPANDER AND FLEX HD (ACELLULAR  HYDRATED DERMIS) Bilateral 01/09/2016  . BREAST RECONSTRUCTION WITH PLACEMENT OF TISSUE EXPANDER AND FLEX HD (ACELLULAR HYDRATED DERMIS) Bilateral 01/09/2016   Procedure: BREAST RECONSTRUCTION WITH PLACEMENT OF TISSUE EXPANDER AND FLEX HD (ACELLULAR HYDRATED DERMIS);  Surgeon: Loel Lofty Cova [place]ieriem, DO;  Location: [place];  Service: Plastics;  Laterality: Bilateral;  . BREAST RECONSTRUCTION WITH PLACEMENT OF TISSUE EXPANDER AND FLEX HD (ACELLULAR HYDRATED DERMIS) Bilateral 05/29/2016   Procedure: PLACEMENT OF BILATERAL TISSUE EXPANDER AND FLEX HD (ACELLULAR HYDRATED DERMIS);  Surgeon: Wallace Going, DO;  Location: [place];  Service: Plastics;  Laterality: Bilateral;  . [place]; 1989; 1991  . INCISION AND DRAINAGE OF WOUND Bilateral 02/11/2016   Procedure: IRRIGATION AND DEBRIDEMENT OF BILATERAL BREAST POCKET;  Surgeon: Wallace Going, DO;  Location: [place];  Service: Plastics;  Laterality: Bilateral;  . LAPAROSCOPIC CHOLECYSTECTOMY  ~ 1999  . MASTECTOMY Bilateral 01/09/2016  . NIPPLE SPARING MASTECTOMY/SENTINAL LYMPH NODE BIOPSY/RECONSTRUCTION/PLACEMENT OF TISSUE EXPANDER Bilateral 01/09/2016   Procedure: BILATERAL NIPPLE SPARING MASTECTOMY WITH LEFT SENTINAL LYMPH NODE BIOPSY ;  Surgeon: Stark Klein, MD;  Location: [place];  Service: General;  Laterality: Bilateral;  . REMOVAL OF TISSUE EXPANDER Bilateral 02/11/2016   Procedure: REMOVAL OF BILATERAL TISSUE EXPANDERS AND FLEX HD REMOVAL;  Surgeon: Wallace Going, DO;  Location: [place];  Service: Plastics;  Laterality: Bilateral;  . TUBAL LIGATION  1991    Family History  Problem Relation Age of Onset  . Heart failure Father   . Prostate cancer Father 73  . Colon polyps Mother  approx 2  . Other Mother     hx HPV and hysterectomy due to precancerous cells  . Other Sister 63    hx of hysterectomy for unspecified reason; still has ovaries  . Other Sister 72    paternal  half-sister hx of hysterectomy for unspecified reason; still has ovaries  . Bladder Cancer Maternal Uncle 79    not a smoker  . Kidney failure Maternal Grandmother   . Congestive Heart Failure Maternal Grandmother   . Colon cancer Maternal Grandmother 63  . Lung cancer Maternal Grandfather 57    smoker  . Breast cancer Paternal Grandmother     dx. early 52s; w/ hx of trauma to breast  . Crohn's disease Daughter   . Lung cancer Maternal Uncle 27    smoker   Social History:  reports that she quit smoking about 8 years ago. Her smoking use included Cigarettes. She has a 10.00 pack-year smoking history. She has never used smokeless tobacco. She reports that she does not drink alcohol or use drugs.  Allergies: No Known Allergies   (Not in a hospital admission)  No results found for this or any previous visit (from the past 48 hour(s)). No results found.  Review of Systems  Constitutional: Negative.   HENT: Negative.   Eyes: Negative.   Respiratory: Negative.   Cardiovascular: Negative.   Gastrointestinal: Negative.   Genitourinary: Negative.   Musculoskeletal: Negative.   Skin: Negative.   Neurological: Negative.   Psychiatric/Behavioral: Negative.     There were no vitals taken for this visit. Physical Exam  Constitutional: She is oriented to person, place, and time. She appears well-developed and well-nourished.  HENT:  Head: Normocephalic and atraumatic.  Eyes: EOM are normal. Pupils are equal, round, and reactive to light.  Cardiovascular: Normal rate.   Respiratory: Effort normal.  GI: Soft.  Neurological: She is alert and oriented to person, place, and time.  Skin: Skin is warm. No rash noted. No erythema.  Psychiatric: She has a normal mood and affect. Her behavior is normal. Judgment and thought content normal.     Assessment/Plan Removal of bilateral breast expanders and placement of silicone implants.  Wallace Going, DO 08/18/2016, 8:04 PM

## 2016-08-20 NOTE — Interval H&P Note (Signed)
History and Physical Interval Note:  08/20/2016 9:54 AM  Christina [name]  has presented today for surgery, with the diagnosis of HISTORY OF LEFT BREAST CANCER  The various methods of treatment have been discussed with the patient and family. After consideration of risks, benefits and other options for treatment, the patient has consented to  Procedure(s): REMOVAL OF BILATERAL TISSUE EXPANDERS WITH PLACEMENT OF BILATERAL SILICONE IMPLANTS (Bilateral) as a surgical intervention .  The patient's history has been reviewed, patient examined, no change in status, stable for surgery.  I have reviewed the patient's chart and labs.  Questions were answered to the patient's satisfaction.     Wallace Going

## 2016-08-20 NOTE — Anesthesia Procedure Notes (Signed)
Procedure Name: Intubation Date/Time: 08/20/2016 10:17 AM Performed by: Christina [name] Pre-anesthesia Checklist: Patient identified, Emergency Drugs available, Suction available and Patient being monitored Patient Re-evaluated:Patient Re-evaluated prior to inductionOxygen Delivery Method: Circle system utilized Preoxygenation: Pre-oxygenation with 100% oxygen Intubation Type: IV induction Ventilation: Mask ventilation without difficulty Laryngoscope Size: Miller and 2 Grade View: Grade I Tube type: Oral Tube size: 7.0 mm Number of attempts: 1 Airway Equipment and Method: Stylet and Oral airway Placement Confirmation: ETT inserted through vocal cords under direct vision,  positive ETCO2 and breath sounds checked- equal and bilateral Secured at: 21 cm Tube secured with: Tape Dental Injury: Teeth and Oropharynx as per pre-operative assessment

## 2016-08-21 ENCOUNTER — Encounter (HOSPITAL_BASED_OUTPATIENT_CLINIC_OR_DEPARTMENT_OTHER): Payer: Self-pay | Admitting: Plastic Surgery

## 2016-10-13 ENCOUNTER — Telehealth: Payer: Self-pay | Admitting: Genetic Counselor

## 2016-10-13 NOTE — Telephone Encounter (Signed)
[facility] reached out to me to contact the patient and see if she would be willing to sign a consent for them to appeal on her behalf for the insurance company to cover her genetic testing.  I called and spoke with patient who agreed to call the lab and provide consent.  I gave her the phone number of the lab and my phone number in case she had further questions.

## 2016-10-22 DIAGNOSIS — Z79899 Other long term (current) drug therapy: Secondary | ICD-10-CM | POA: Diagnosis not present

## 2016-10-22 DIAGNOSIS — E669 Obesity, unspecified: Secondary | ICD-10-CM | POA: Diagnosis not present

## 2016-10-22 DIAGNOSIS — Z713 Dietary counseling and surveillance: Secondary | ICD-10-CM | POA: Diagnosis not present

## 2016-10-29 DIAGNOSIS — M25521 Pain in right elbow: Secondary | ICD-10-CM | POA: Diagnosis not present

## 2016-10-29 DIAGNOSIS — M7711 Lateral epicondylitis, right elbow: Secondary | ICD-10-CM | POA: Diagnosis not present

## 2016-10-29 DIAGNOSIS — M25571 Pain in right ankle and joints of right foot: Secondary | ICD-10-CM | POA: Diagnosis not present

## 2016-11-18 ENCOUNTER — Ambulatory Visit: Payer: BLUE CROSS/BLUE SHIELD | Admitting: Hematology and Oncology

## 2016-11-18 NOTE — Assessment & Plan Note (Deleted)
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

## 2016-11-19 ENCOUNTER — Encounter (HOSPITAL_BASED_OUTPATIENT_CLINIC_OR_DEPARTMENT_OTHER): Payer: Self-pay | Admitting: *Deleted

## 2016-11-21 ENCOUNTER — Ambulatory Visit: Payer: Self-pay | Admitting: Plastic Surgery

## 2016-11-21 DIAGNOSIS — N651 Disproportion of reconstructed breast: Secondary | ICD-10-CM

## 2016-11-21 NOTE — H&P (Signed)
Christina [name] is an 51 y.o. female.   Chief Complaint: breast asymmetry HPI: The patient is a 51 yrs old wf here pre operative history and physical prior to bilateral breast reconstruction revision with release of bilateral capsular contracture and lipo-filling of bilateral breasts for improved symmetry. She underwent bilateral secondary breast reconstruction with placement of bilateral silicone implants on 74/08/14. She has the implants in place.  There is capsule contracture at the medial and middle portion of the left breast and the middle inferior portion of the right breast.  This is creating a fold at the central portion inferior to the NAC. She desires improved symmetry and shape and is ready to proceed with revision.   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 She is 5 feet 7 inches tall, weighs 189 pounds, Preop = 36 C. Her genetics was positive. She quit smoking in 2009. She had mild ptosis of her breasts.  She underwent bilateral nipple sparing mastectomies with immediate reconstruction using expanders and FlexHD on 01/09/16. She developed infections in the breast pockets and bilateral expanders / ADM were removed 02/11/16. She was treated with antibiotics and showed resolution.  She then underwent replacement of bilateral tissue expanders and ADM for bilateralbreast reconstruction on 05/29/16.   Past Medical History:  Diagnosis Date  . Anxiety   . Arthritis    hands and knees  . 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 Depression   . Ovarian cyst    "they come and go" (01/09/2016)  . PONV (postoperative nausea and vomiting)   . Urgency of urination     Past Surgical History:  Procedure Laterality Date  . ABDOMINAL HYSTERECTOMY  05/1996   endometriosis  . ACHILLES TENDON REPAIR Right 2010  . APPENDECTOMY  [id number]   Early mucocele of appendix,  removal of small leiomyoma from pelvis, lysis of adhesions  . BREAST BIOPSY Left 10/2015  . BREAST RECONSTRUCTION WITH PLACEMENT OF TISSUE EXPANDER AND FLEX HD (ACELLULAR HYDRATED DERMIS) Bilateral 01/09/2016  . BREAST RECONSTRUCTION WITH PLACEMENT OF TISSUE EXPANDER AND FLEX HD (ACELLULAR HYDRATED DERMIS) Bilateral 01/09/2016   Procedure: BREAST RECONSTRUCTION WITH PLACEMENT OF TISSUE EXPANDER AND FLEX HD (ACELLULAR HYDRATED DERMIS);  Surgeon: Loel Lofty Morry Veiga, DO;  Location: [place];  Service: Plastics;  Laterality: Bilateral;  . BREAST RECONSTRUCTION WITH PLACEMENT OF TISSUE EXPANDER AND FLEX HD (ACELLULAR HYDRATED DERMIS) Bilateral 05/29/2016   Procedure: PLACEMENT OF BILATERAL TISSUE EXPANDER AND FLEX HD (ACELLULAR HYDRATED DERMIS);  Surgeon: Wallace Going, DO;  Location: [place];  Service: Plastics;  Laterality: Bilateral;  . [place]; 1989; 1991  . INCISION AND DRAINAGE OF WOUND Bilateral 02/11/2016   Procedure: IRRIGATION AND DEBRIDEMENT OF BILATERAL BREAST POCKET;  Surgeon: Wallace Going, DO;  Location: [place];  Service: Plastics;  Laterality: Bilateral;  . LAPAROSCOPIC CHOLECYSTECTOMY  ~ 1999  . MASTECTOMY Bilateral 01/09/2016  . NIPPLE SPARING MASTECTOMY/SENTINAL LYMPH NODE BIOPSY/RECONSTRUCTION/PLACEMENT OF TISSUE EXPANDER Bilateral 01/09/2016   Procedure: BILATERAL NIPPLE SPARING MASTECTOMY WITH LEFT SENTINAL LYMPH NODE BIOPSY ;  Surgeon: Stark Klein, MD;  Location: [place];  Service: General;  Laterality: Bilateral;  . REMOVAL OF BILATERAL TISSUE EXPANDERS WITH PLACEMENT OF BILATERAL BREAST IMPLANTS Bilateral 08/20/2016   Procedure: REMOVAL OF BILATERAL TISSUE EXPANDERS WITH PLACEMENT OF BILATERAL SILICONE IMPLANTS;  Surgeon: Loel Lofty Victorhugo Preis, DO;  Location: [facility];  Service: Plastics;  Laterality: Bilateral;  . REMOVAL OF TISSUE EXPANDER Bilateral 02/11/2016   Procedure: REMOVAL OF BILATERAL TISSUE EXPANDERS AND  FLEX HD REMOVAL;  Surgeon: Wallace Going, DO;  Location: [place];  Service: Plastics;  Laterality: Bilateral;  . TUBAL LIGATION  1991    Family History  Problem Relation Age of Onset  . Heart failure Father   . Prostate cancer Father 54  . Colon polyps Mother     approx 2  . Other Mother     hx HPV and hysterectomy due to precancerous cells  . Other Sister 13    hx of hysterectomy for unspecified reason; still has ovaries  . Other Sister 49    paternal half-sister hx of hysterectomy for unspecified reason; still has ovaries  . Bladder Cancer Maternal Uncle 79    not a smoker  . Kidney failure Maternal Grandmother   . Congestive Heart Failure Maternal Grandmother   . Colon cancer Maternal Grandmother 17  . Lung cancer Maternal Grandfather 32    smoker  . Breast cancer Paternal Grandmother     dx. early 58s; w/ hx of trauma to breast  . Crohn's disease Daughter   . Lung cancer Maternal Uncle 39    smoker   Social History:  reports that she quit smoking about 8 years ago. Her smoking use included Cigarettes. She has a 10.00 pack-year smoking history. She has never used smokeless tobacco. She reports that she does not drink alcohol or use drugs.  Allergies: No Known Allergies   (Not in a hospital admission)  No results found for this or any previous visit (from the past 48 hour(s)). No results found.  Review of Systems  Constitutional: Negative.   HENT: Negative.   Eyes: Negative.   Respiratory: Negative.   Cardiovascular: Negative.   Gastrointestinal: Negative.   Genitourinary: Negative.   Musculoskeletal: Negative.   Skin: Negative.   Neurological: Negative.   Psychiatric/Behavioral: Negative.     There were no vitals taken for this visit. Physical Exam  Constitutional: She appears well-developed and well-nourished.  HENT:  Head: Normocephalic and atraumatic.  Eyes: EOM are normal. Pupils are equal, round, and reactive to light.   Cardiovascular: Normal rate.   Respiratory: Effort normal. No respiratory distress.  GI: Soft. She exhibits no distension. There is no tenderness.  Neurological: She is alert.  Skin: No rash noted. No erythema. No pallor.  Psychiatric: She has a normal mood and affect. Her behavior is normal. Judgment and thought content normal.     Assessment/Plan Breast asymmetry following reconstructive surgery - Plan: SCD's, Informed Consent Details: Transcribe to consent form and obtain patient signature, Initiate Pre-op Protocol, Pre-admission testing diagnosis, N/A-Follow Short Stay CHG Process, Labs per anesthesia, Diet NPO time specified, ceFAZolin (ANCEF) 2 g in dextrose 5 % 100 mL IVPB  Plan release of bilateral breast capsular contracture and lipo-filling bilateral breasts to improve symmetry.   The risks that can be encountered with and after liposuction/lipo-filling were discussed and include the following but no limited to these:  Asymmetry, fluid accumulation, firmness of the area, fat necrosis with death of fat tissue, bleeding, infection, delayed healing, anesthesia risks, skin sensation changes, injury to structures including nerves, blood vessels, and muscles which may be temporary or permanent, allergies to tape, suture materials and glues, blood products, topical preparations or injected agents, skin and contour irregularities, skin discoloration and swelling, deep vein thrombosis, cardiac and pulmonary complications, pain, which may  persist, persistent pain, recurrence of the lesion, poor healing of the incision, possible need for revisional surgery or staged procedures. There can also be persistent swelling, poor wound healing, rippling or loose skin, worsening of cellulite, and swelling. Any change in weight fluctuations can alter the outcome. The patient's questions were answered and she desires to proceed and consent was obtained.  Wallace Going, DO 11/21/2016, 7:09 AM

## 2016-11-25 NOTE — Anesthesia Preprocedure Evaluation (Addendum)
Anesthesia Evaluation  Patient identified by MRN, date of birth, ID band Patient awake    Reviewed: Allergy & Precautions, NPO status , Patient's Chart, lab work & pertinent test results  History of Anesthesia Complications (+) PONV and history of anesthetic complications  Airway Mallampati: II  TM Distance: >3 FB Neck ROM: Full    Dental no notable dental hx. (+) Dental Advisory Given, Partial Upper   Pulmonary neg pulmonary ROS, former smoker,    Pulmonary exam normal        Cardiovascular negative cardio ROS Normal cardiovascular exam     Neuro/Psych PSYCHIATRIC DISORDERS Anxiety Depression    GI/Hepatic negative GI ROS, Neg liver ROS,   Endo/Other  negative endocrine ROS  Renal/GU negative Renal ROS     Musculoskeletal  (+) Arthritis ,   Abdominal   Peds  Hematology   Anesthesia Other Findings   Reproductive/Obstetrics                           Anesthesia Physical  Anesthesia Plan  ASA: II  Anesthesia Plan: General   Post-op Pain Management:    Induction: Intravenous  Airway Management Planned: LMA  Additional Equipment:   Intra-op Plan:   Post-operative Plan: Extubation in OR  Informed Consent: I have reviewed the patients History and Physical, chart, labs and discussed the procedure including the risks, benefits and alternatives for the proposed anesthesia with the patient or authorized representative who has indicated his/her understanding and acceptance.   Dental advisory given  Plan Discussed with: CRNA and Anesthesiologist  Anesthesia Plan Comments:        Anesthesia Quick Evaluation

## 2016-11-26 ENCOUNTER — Ambulatory Visit (HOSPITAL_BASED_OUTPATIENT_CLINIC_OR_DEPARTMENT_OTHER): Payer: BLUE CROSS/BLUE SHIELD | Admitting: Anesthesiology

## 2016-11-26 ENCOUNTER — Encounter (HOSPITAL_BASED_OUTPATIENT_CLINIC_OR_DEPARTMENT_OTHER): Payer: Self-pay | Admitting: Anesthesiology

## 2016-11-26 ENCOUNTER — Encounter (HOSPITAL_BASED_OUTPATIENT_CLINIC_OR_DEPARTMENT_OTHER)
Admission: RE | Disposition: A | Discharge status: Home / Self Care | Payer: Self-pay | Source: Ambulatory Visit | Attending: Plastic Surgery

## 2016-11-26 ENCOUNTER — Ambulatory Visit (HOSPITAL_BASED_OUTPATIENT_CLINIC_OR_DEPARTMENT_OTHER)
Admission: RE | Admit: 2016-11-26 | Discharge: 2016-11-26 | Disposition: A | Discharge status: Home / Self Care | Payer: BLUE CROSS/BLUE SHIELD | Source: Ambulatory Visit | Attending: Plastic Surgery | Admitting: Plastic Surgery

## 2016-11-26 DIAGNOSIS — Z803 Family history of malignant neoplasm of breast: Secondary | ICD-10-CM | POA: Diagnosis not present

## 2016-11-26 DIAGNOSIS — Y838 Other surgical procedures as the cause of abnormal reaction of the patient, or of later complication, without mention of misadventure at the time of the procedure: Secondary | ICD-10-CM | POA: Insufficient documentation

## 2016-11-26 DIAGNOSIS — Z9013 Acquired absence of bilateral breasts and nipples: Secondary | ICD-10-CM | POA: Insufficient documentation

## 2016-11-26 DIAGNOSIS — M1990 Unspecified osteoarthritis, unspecified site: Secondary | ICD-10-CM | POA: Diagnosis not present

## 2016-11-26 DIAGNOSIS — Z87891 Personal history of nicotine dependence: Secondary | ICD-10-CM | POA: Diagnosis not present

## 2016-11-26 DIAGNOSIS — F419 Anxiety disorder, unspecified: Secondary | ICD-10-CM | POA: Diagnosis not present

## 2016-11-26 DIAGNOSIS — T8544XA Capsular contracture of breast implant, initial encounter: Secondary | ICD-10-CM | POA: Insufficient documentation

## 2016-11-26 DIAGNOSIS — N6489 Other specified disorders of breast: Secondary | ICD-10-CM | POA: Diagnosis not present

## 2016-11-26 DIAGNOSIS — N809 Endometriosis, unspecified: Secondary | ICD-10-CM | POA: Diagnosis not present

## 2016-11-26 DIAGNOSIS — F418 Other specified anxiety disorders: Secondary | ICD-10-CM | POA: Diagnosis not present

## 2016-11-26 DIAGNOSIS — Z853 Personal history of malignant neoplasm of breast: Secondary | ICD-10-CM | POA: Diagnosis not present

## 2016-11-26 DIAGNOSIS — N651 Disproportion of reconstructed breast: Secondary | ICD-10-CM

## 2016-11-26 DIAGNOSIS — C50112 Malignant neoplasm of central portion of left female breast: Secondary | ICD-10-CM | POA: Diagnosis not present

## 2016-11-26 HISTORY — PX: BREAST REDUCTION SURGERY: SHX8

## 2016-11-26 HISTORY — PX: LIPOSUCTION WITH LIPOFILLING: SHX6436

## 2016-11-26 SURGERY — BREAST REDUCTION WITH LIPOSUCTION
Anesthesia: General | Site: Breast | Laterality: Bilateral

## 2016-11-26 MED ORDER — SODIUM CHLORIDE 0.9% FLUSH: 3.0000 mL | Freq: Two times a day (BID) | INTRAVENOUS | Status: DC

## 2016-11-26 MED ORDER — EPINEPHRINE PF 1 MG/ML IJ SOLN
INTRAMUSCULAR | Status: DC | PRN
  Administered 2016-11-26: 1000 mL

## 2016-11-26 MED ORDER — CEFAZOLIN SODIUM-DEXTROSE 2-4 GM/100ML-% IV SOLN
2.0000 g | INTRAVENOUS | Status: AC
  Administered 2016-11-26: 2 g via INTRAVENOUS

## 2016-11-26 MED ORDER — SCOPOLAMINE 1 MG/3DAYS TD PT72
1.0000 | MEDICATED_PATCH | Freq: Once | TRANSDERMAL | Status: AC | PRN
  Administered 2016-11-26: 1 via TRANSDERMAL

## 2016-11-26 MED ORDER — ONDANSETRON HCL 4 MG/2ML IJ SOLN
INTRAMUSCULAR | Status: AC
  Filled 2016-11-26: qty 2

## 2016-11-26 MED ORDER — CEFAZOLIN SODIUM-DEXTROSE 2-4 GM/100ML-% IV SOLN
INTRAVENOUS | Status: AC
  Filled 2016-11-26: qty 100

## 2016-11-26 MED ORDER — ONDANSETRON HCL 4 MG/2ML IJ SOLN
INTRAMUSCULAR | Status: DC | PRN
  Administered 2016-11-26: 4 mg via INTRAVENOUS

## 2016-11-26 MED ORDER — MIDAZOLAM HCL 2 MG/2ML IJ SOLN
INTRAMUSCULAR | Status: AC
  Filled 2016-11-26: qty 2

## 2016-11-26 MED ORDER — SODIUM CHLORIDE 0.9 % IR SOLN
Status: DC | PRN
  Administered 2016-11-26: 500 mL

## 2016-11-26 MED ORDER — SODIUM CHLORIDE 0.9 % IV SOLN: 250.0000 mL | INTRAVENOUS | Status: DC | PRN

## 2016-11-26 MED ORDER — DEXAMETHASONE SODIUM PHOSPHATE 4 MG/ML IJ SOLN
INTRAMUSCULAR | Status: DC | PRN
  Administered 2016-11-26: 10 mg via INTRAVENOUS

## 2016-11-26 MED ORDER — LACTATED RINGERS IV SOLN
INTRAVENOUS | Status: DC | PRN
Start: 2016-11-26 — End: 2016-11-26
  Administered 2016-11-26: 1000 mL

## 2016-11-26 MED ORDER — LIDOCAINE HCL (PF) 1 % IJ SOLN
INTRAMUSCULAR | Status: AC
  Filled 2016-11-26: qty 150

## 2016-11-26 MED ORDER — FENTANYL CITRATE (PF) 100 MCG/2ML IJ SOLN
INTRAMUSCULAR | Status: AC
  Filled 2016-11-26: qty 2

## 2016-11-26 MED ORDER — LIDOCAINE HCL 1 % IJ SOLN
INTRAMUSCULAR | Status: DC | PRN
  Administered 2016-11-26: 50 mL

## 2016-11-26 MED ORDER — DEXAMETHASONE SODIUM PHOSPHATE 10 MG/ML IJ SOLN
INTRAMUSCULAR | Status: AC
  Filled 2016-11-26: qty 1

## 2016-11-26 MED ORDER — ACETAMINOPHEN 650 MG RE SUPP: 650.0000 mg | RECTAL | Status: DC | PRN

## 2016-11-26 MED ORDER — HYDROMORPHONE HCL 1 MG/ML IJ SOLN: 0.2500 mg | INTRAMUSCULAR | Status: DC | PRN

## 2016-11-26 MED ORDER — LACTATED RINGERS IV SOLN
INTRAVENOUS | Status: DC
  Administered 2016-11-26 (×2): via INTRAVENOUS

## 2016-11-26 MED ORDER — EPINEPHRINE 30 MG/30ML IJ SOLN
INTRAMUSCULAR | Status: AC
  Filled 2016-11-26: qty 1

## 2016-11-26 MED ORDER — SCOPOLAMINE 1 MG/3DAYS TD PT72
1.0000 | MEDICATED_PATCH | TRANSDERMAL | Status: DC
  Administered 2016-11-26: 1.5 mg via TRANSDERMAL

## 2016-11-26 MED ORDER — MIDAZOLAM HCL 2 MG/2ML IJ SOLN
1.0000 mg | INTRAMUSCULAR | Status: DC | PRN
  Administered 2016-11-26: 2 mg via INTRAVENOUS

## 2016-11-26 MED ORDER — LIDOCAINE 2% (20 MG/ML) 5 ML SYRINGE
INTRAMUSCULAR | Status: DC | PRN
  Administered 2016-11-26: 100 mg via INTRAVENOUS

## 2016-11-26 MED ORDER — SCOPOLAMINE 1 MG/3DAYS TD PT72
MEDICATED_PATCH | TRANSDERMAL | Status: AC
  Filled 2016-11-26: qty 1

## 2016-11-26 MED ORDER — LIDOCAINE-EPINEPHRINE (PF) 1 %-1:200000 IJ SOLN
INTRAMUSCULAR | Status: AC
  Filled 2016-11-26: qty 30

## 2016-11-26 MED ORDER — PROPOFOL 500 MG/50ML IV EMUL
INTRAVENOUS | Status: AC
  Filled 2016-11-26: qty 50

## 2016-11-26 MED ORDER — ACETAMINOPHEN 325 MG PO TABS
650.0000 mg | ORAL_TABLET | ORAL | Status: DC | PRN
Start: 2016-11-26 — End: 2016-11-26

## 2016-11-26 MED ORDER — PROMETHAZINE HCL 25 MG/ML IJ SOLN: 6.2500 mg | INTRAMUSCULAR | Status: DC | PRN

## 2016-11-26 MED ORDER — SODIUM CHLORIDE 0.9% FLUSH: 3.0000 mL | INTRAVENOUS | Status: DC | PRN

## 2016-11-26 MED ORDER — FENTANYL CITRATE (PF) 100 MCG/2ML IJ SOLN
50.0000 ug | INTRAMUSCULAR | Status: AC | PRN
  Administered 2016-11-26 (×7): 25 ug via INTRAVENOUS
  Administered 2016-11-26: 100 ug via INTRAVENOUS
  Administered 2016-11-26: 25 ug via INTRAVENOUS

## 2016-11-26 MED ORDER — PROPOFOL 10 MG/ML IV BOLUS
INTRAVENOUS | Status: DC | PRN
  Administered 2016-11-26: 180 mg via INTRAVENOUS

## 2016-11-26 MED ORDER — LIDOCAINE-EPINEPHRINE (PF) 1 %-1:200000 IJ SOLN
INTRAMUSCULAR | Status: DC | PRN
  Administered 2016-11-26: 10 mL

## 2016-11-26 SURGICAL SUPPLY — 74 items
BAG DECANTER FOR FLEXI CONT (MISCELLANEOUS) ×2 IMPLANT
BINDER ABDOMINAL  9 SM 30-45 (SOFTGOODS) ×1
BINDER ABDOMINAL 10 UNV 27-48 (MISCELLANEOUS) IMPLANT
BINDER ABDOMINAL 12 SM 30-45 (SOFTGOODS) IMPLANT
BINDER ABDOMINAL 9 SM 30-45 (SOFTGOODS) ×1 IMPLANT
BINDER BREAST LRG (GAUZE/BANDAGES/DRESSINGS) IMPLANT
BINDER BREAST MEDIUM (GAUZE/BANDAGES/DRESSINGS) IMPLANT
BINDER BREAST XLRG (GAUZE/BANDAGES/DRESSINGS) ×2 IMPLANT
BINDER BREAST XXLRG (GAUZE/BANDAGES/DRESSINGS) IMPLANT
BIOPATCH RED 1 DISK 7.0 (GAUZE/BANDAGES/DRESSINGS) IMPLANT
BLADE HEX COATED 2.75 (ELECTRODE) ×2 IMPLANT
BLADE KNIFE PERSONA 10 (BLADE) ×4 IMPLANT
BLADE SURG 15 STRL LF DISP TIS (BLADE) ×1 IMPLANT
BLADE SURG 15 STRL SS (BLADE) ×1
BNDG GAUZE ELAST 4 BULKY (GAUZE/BANDAGES/DRESSINGS) ×4 IMPLANT
CANISTER SUCT 1200ML W/VALVE (MISCELLANEOUS) ×2 IMPLANT
CHLORAPREP W/TINT 26ML (MISCELLANEOUS) ×2 IMPLANT
COVER BACK TABLE 60X90IN (DRAPES) ×2 IMPLANT
COVER MAYO STAND STRL (DRAPES) ×2 IMPLANT
DECANTER SPIKE VIAL GLASS SM (MISCELLANEOUS) IMPLANT
DERMABOND ADVANCED (GAUZE/BANDAGES/DRESSINGS) ×1
DERMABOND ADVANCED .7 DNX12 (GAUZE/BANDAGES/DRESSINGS) ×1 IMPLANT
DRAIN CHANNEL 19F RND (DRAIN) IMPLANT
DRAPE LAPAROSCOPIC ABDOMINAL (DRAPES) ×2 IMPLANT
DRSG PAD ABDOMINAL 8X10 ST (GAUZE/BANDAGES/DRESSINGS) ×4 IMPLANT
ELECT BLADE 4.0 EZ CLEAN MEGAD (MISCELLANEOUS)
ELECT REM PT RETURN 9FT ADLT (ELECTROSURGICAL) ×2
ELECTRODE BLDE 4.0 EZ CLN MEGD (MISCELLANEOUS) IMPLANT
ELECTRODE REM PT RTRN 9FT ADLT (ELECTROSURGICAL) ×1 IMPLANT
EVACUATOR SILICONE 100CC (DRAIN) IMPLANT
EXTRACTOR CANIST REVOLVE STRL (CANNISTER) ×2 IMPLANT
FILTER LIPOSUCTION (MISCELLANEOUS) ×2 IMPLANT
GLOVE BIO SURGEON STRL SZ 6.5 (GLOVE) ×10 IMPLANT
GOWN STRL REUS W/ TWL LRG LVL3 (GOWN DISPOSABLE) ×2 IMPLANT
GOWN STRL REUS W/TWL LRG LVL3 (GOWN DISPOSABLE) ×2
IV LACTATED RINGERS 1000ML (IV SOLUTION) ×4 IMPLANT
LINER CANISTER 1000CC FLEX (MISCELLANEOUS) ×2 IMPLANT
NDL SAFETY ECLIPSE 18X1.5 (NEEDLE) ×1 IMPLANT
NEEDLE HYPO 18GX1.5 SHARP (NEEDLE) ×1
NEEDLE HYPO 25X1 1.5 SAFETY (NEEDLE) ×2 IMPLANT
NS IRRIG 1000ML POUR BTL (IV SOLUTION) IMPLANT
PACK BASIN DAY SURGERY FS (CUSTOM PROCEDURE TRAY) ×2 IMPLANT
PAD ALCOHOL SWAB (MISCELLANEOUS) ×2 IMPLANT
PENCIL BUTTON HOLSTER BLD 10FT (ELECTRODE) ×2 IMPLANT
SLEEVE SCD COMPRESS KNEE MED (MISCELLANEOUS) ×2 IMPLANT
SPONGE LAP 18X18 X RAY DECT (DISPOSABLE) ×4 IMPLANT
STRIP SUTURE WOUND CLOSURE 1/2 (SUTURE) ×4 IMPLANT
SUT MNCRL 6-0 UNDY P1 1X18 (SUTURE) ×2 IMPLANT
SUT MNCRL AB 4-0 PS2 18 (SUTURE) ×4 IMPLANT
SUT MON AB 3-0 SH 27 (SUTURE) ×4
SUT MON AB 3-0 SH27 (SUTURE) ×4 IMPLANT
SUT MON AB 5-0 PS2 18 (SUTURE) ×6 IMPLANT
SUT MONOCRYL 6-0 P1 1X18 (SUTURE) ×2
SUT PDS 3-0 CT2 (SUTURE)
SUT PDS AB 2-0 CT2 27 (SUTURE) IMPLANT
SUT PDS II 3-0 CT2 27 ABS (SUTURE) IMPLANT
SUT SILK 3 0 PS 1 (SUTURE) IMPLANT
SUT VIC AB 3-0 SH 27 (SUTURE)
SUT VIC AB 3-0 SH 27X BRD (SUTURE) IMPLANT
SUT VICRYL 4-0 PS2 18IN ABS (SUTURE) IMPLANT
SYR 10ML LL (SYRINGE) ×8 IMPLANT
SYR 3ML 18GX1 1/2 (SYRINGE) IMPLANT
SYR 3ML 23GX1 SAFETY (SYRINGE) ×2 IMPLANT
SYR 50ML LL SCALE MARK (SYRINGE) ×4 IMPLANT
SYR BULB IRRIGATION 50ML (SYRINGE) ×2 IMPLANT
SYR CONTROL 10ML LL (SYRINGE) ×2 IMPLANT
SYR TOOMEY 50ML (SYRINGE) ×4 IMPLANT
TAPE MEASURE VINYL STERILE (MISCELLANEOUS) IMPLANT
TOWEL OR 17X24 6PK STRL BLUE (TOWEL DISPOSABLE) ×4 IMPLANT
TUBE CONNECTING 20X1/4 (TUBING) ×2 IMPLANT
TUBING INFILTRATION IT-10001 (TUBING) IMPLANT
TUBING SET GRADUATE ASPIR 12FT (MISCELLANEOUS) ×2 IMPLANT
UNDERPAD 30X30 (UNDERPADS AND DIAPERS) ×4 IMPLANT
YANKAUER SUCT BULB TIP NO VENT (SUCTIONS) ×2 IMPLANT

## 2016-11-26 NOTE — Interval H&P Note (Signed)
History and Physical Interval Note:  11/26/2016 8:12 AM  Christina [name]  has presented today for surgery, with the diagnosis of history of left breast cancer  The various methods of treatment have been discussed with the patient and family. After consideration of risks, benefits and other options for treatment, the patient has consented to  Procedure(s): BILATERAL BREAST CAPSULE CONTRACTURE RELASE (Bilateral) LIPOFILLING FOR SYMMETRY (Bilateral) as a surgical intervention .  The patient's history has been reviewed, patient examined, no change in status, stable for surgery.  I have reviewed the patient's chart and labs.  Questions were answered to the patient's satisfaction.     Wallace Going

## 2016-11-26 NOTE — Anesthesia Postprocedure Evaluation (Addendum)
Anesthesia Post Note  Patient: Christina [name]  Procedure(s) Performed: Procedure(s) (LRB): BILATERAL BREAST CAPSULE CONTRACTURE RELASE (Bilateral) LIPOFILLING FOR SYMMETRY (Bilateral)  Patient location during evaluation: PACU Anesthesia Type: General Level of consciousness: sedated Pain management: pain level controlled Vital Signs Assessment: post-procedure vital signs reviewed and stable Respiratory status: spontaneous breathing and respiratory function stable Cardiovascular status: stable Anesthetic complications: no       Last Vitals:  Vitals:   11/26/16 1130 11/26/16 1150  BP: 130/76 130/86  Pulse: 80 80  Resp: 20 16  Temp:  36.8 C    Last Pain:  Vitals:   11/26/16 1150  TempSrc: Oral  PainSc: 0-No pain                 Manfred Laspina DANIEL

## 2016-11-26 NOTE — Discharge Instructions (Signed)
No heavy lifting May shower tomorrow Continue binders   Post Anesthesia Home Care Instructions  Activity: Get plenty of rest for the remainder of the day. A responsible individual must stay with you for 24 hours following the procedure.  For the next 24 hours, DO NOT: -Drive a car -Paediatric nurse -Drink alcoholic beverages -Take any medication unless instructed by your physician -Make any legal decisions or sign important papers.  Meals: Start with liquid foods such as gelatin or soup. Progress to regular foods as tolerated. Avoid greasy, spicy, heavy foods. If nausea and/or vomiting occur, drink only clear liquids until the nausea and/or vomiting subsides. Call your physician if vomiting continues.  Special Instructions/Symptoms: Your throat may feel dry or sore from the anesthesia or the breathing tube placed in your throat during surgery. If this causes discomfort, gargle with warm salt water. The discomfort should disappear within 24 hours.  If you had a scopolamine patch placed behind your ear for the management of post- operative nausea and/or vomiting:  1. The medication in the patch is effective for 72 hours, after which it should be removed.  Wrap patch in a tissue and discard in the trash. Wash hands thoroughly with soap and water. 2. You may remove the patch earlier than 72 hours if you experience unpleasant side effects which may include dry mouth, dizziness or visual disturbances. 3. Avoid touching the patch. Wash your hands with soap and water after contact with the patch.

## 2016-11-26 NOTE — Transfer of Care (Signed)
Immediate Anesthesia Transfer of Care Note  Patient: Christina [name]  Procedure(s) Performed: Procedure(s): BILATERAL BREAST CAPSULE CONTRACTURE RELASE (Bilateral) LIPOFILLING FOR SYMMETRY (Bilateral)  Patient Location: PACU  Anesthesia Type:General  Level of Consciousness: sedated  Airway & Oxygen Therapy: Patient Spontanous Breathing and Patient connected to face mask oxygen  Post-op Assessment: Report given to RN and Post -op Vital signs reviewed and stable  Post vital signs: Reviewed and stable  Last Vitals:  Vitals:   11/26/16 1038 11/26/16 1040  BP: (P) 135/78   Pulse: 86 84  Resp: (P) 19 16  Temp:      Last Pain:  Vitals:   11/26/16 0732  TempSrc: Oral      Patients Stated Pain Goal: 0 (20/10/07 1219)  Complications: No apparent anesthesia complications

## 2016-11-26 NOTE — Op Note (Signed)
Op report Bilateral Exchange   DATE OF OPERATION: 11/26/2016  LOCATION: [place]  SURGICAL DIVISION: Plastic Surgery  PREOPERATIVE DIAGNOSES:  1. Breast asymmetry and capsule contracture 2. History of breast cancer.  3. Acquired absence of bilateral breast.   POSTOPERATIVE DIAGNOSES:  1. Breast asymmetry and capsule contracture 2. History of breast cancer.  3. Acquired absence of bilateral breast.  PROCEDURE:  1. Bilateral capsulotomies for implant respositioning. 2. Lipofilling bilateral breasts for improved symmetry and contour.  SURGEON: Cecilia Vancleve Sanger Samuell Knoble, DO  ASSISTANT: Shawn Rayburn, PA  ANESTHESIA:  General.   COMPLICATIONS: None.   INDICATIONS FOR PROCEDURE:  The patient, Christina [name], is a 51 y.o. female born on 20-Dec-1965, is here for further treatment after bilateral mastectomies.  She had tissue expanders placed at the time of mastectomies. She had an infection and the expanders were removed.  She healed and the expanders were replaced.  She was able to go on for exchange of her expanders for implants.  She has been doing well and the implants have been stable.  She requires capsulotomies to better position the implants and lipofilling for improved contour.  Due to her history we had a long conversation about the risks which are very high in her for loss of the implants.  She wanted to proceed. MRN: [id number]  CONSENT:  Informed consent was obtained directly from the patient. Risks, benefits and alternatives were fully discussed. Specific risks including but not limited to bleeding, infection, hematoma, seroma, scarring, pain, implant infection, implant extrusion, capsular contracture, asymmetry, wound healing problems, and need for further surgery were all discussed. The patient did have an ample opportunity to have her questions answered to her satisfaction.   DESCRIPTION OF PROCEDURE:  The patient was taken to the operating room.  SCDs were placed and IV antibiotics were given. The patient's chest was prepped and draped in a sterile fashion. A time out was performed and the implants to be used were identified.  The #15 blade was used to make a small incision at the umbilicus.  The tumescent was infused into the abdominal fat.  On the right breast: One percent Lidocaine with epinephrine was used to infiltrate at the incision site. The old mastectomy scar was incised.  The mastectomy flaps from the superior and inferior flaps were raised over the pectoralis major muscle for several centimeters to minimize tension for the closure. The capsule was opened to expose and remove the implant and it was removed and placed in antibiotic solution.  Inspection of the pocket showed a capsule that was tight at the medial and superior aspect.  This was released with the bovie and the pocket was irrigated with antibiotic solution.  Hemostasis was achieved with electrocautery.   The pocket was irrigated with antibiotic solution. The implant was placed back in the pocket.  There was a portion of the capsule and skin that were so thin, as explained to the patient and anticipated, there was a opening of the skin created and this was closed with 6-0 Monocryl after releasing the capsule.  The capsule was closed with 5-0 Monocryl.  The capsule on the anterior surface was re-closed with a 3-0 Monocryl suture. The remaining skin was closed with 4-0 Monocryl deep dermal and 5-0 Monocryl subcuticular stitches.  Liposuction was performed and the fat was prepared using the Revolve system.  The fat was injected into the superior and lateral aspect of the breast for improved contour and symmetry 110 cc.  On  the left breast: The old mastectomy scar was incised.  The mastectomy flaps from the superior and inferior flaps were raised over the pectoralis major muscle and capsule for several centimeters to minimize tension for the closure and release the contracture. The  capsule was released from the skin that was tethering it down and creating folds.  This was predominetly in medial and lower portion of the breast  This improved the mobilization of the skin over the capsule.  Hemostasis was ensured with the electrocautery.  New gloves were applied.  The capsule on the anterior surface was re-closed with a 3-0 Monocryl suture. The capsule was not opened as the release was obtained on the skin / capsule side and did not want to increase risks. The remaining skin was closed with 4-0 Monocryl deep dermal and 5-0 Monocryl subcuticular stitches. The 150 cc of fat was injected into the superior and medial aspect of the breast for improved contour and symmetry.   Dermabond was applied to the incision site. A breast binder and ABDs were placed.  The patient was allowed to wake from anesthesia and taken to the recovery room in satisfactory condition.

## 2016-11-26 NOTE — Anesthesia Procedure Notes (Signed)
Procedure Name: LMA Insertion Date/Time: 11/26/2016 8:30 AM Performed by: Maryella Shivers Pre-anesthesia Checklist: Patient identified, Emergency Drugs available, Suction available and Patient being monitored Patient Re-evaluated:Patient Re-evaluated prior to inductionOxygen Delivery Method: Circle system utilized Preoxygenation: Pre-oxygenation with 100% oxygen Intubation Type: IV induction Ventilation: Mask ventilation without difficulty LMA: LMA inserted LMA Size: 4.0 Number of attempts: 1 Airway Equipment and Method: Bite block Placement Confirmation: positive ETCO2 Tube secured with: Tape Dental Injury: Teeth and Oropharynx as per pre-operative assessment

## 2016-11-26 NOTE — H&P (View-Only) (Signed)
Christina [name] is an 51 y.o. female.   Chief Complaint: breast asymmetry HPI: The patient is a 51 yrs old wf here pre operative history and physical prior to bilateral breast reconstruction revision with release of bilateral capsular contracture and lipo-filling of bilateral breasts for improved symmetry. She underwent bilateral secondary breast reconstruction with placement of bilateral silicone implants on 32/12/24. She has the implants in place.  There is capsule contracture at the medial and middle portion of the left breast and the middle inferior portion of the right breast.  This is creating a fold at the central portion inferior to the NAC. She desires improved symmetry and shape and is ready to proceed with revision.   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 She is 5 feet 7 inches tall, weighs 189 pounds, Preop = 36 C. Her genetics was positive. She quit smoking in 2009. She had mild ptosis of her breasts.  She underwent bilateral nipple sparing mastectomies with immediate reconstruction using expanders and FlexHD on 01/09/16. She developed infections in the breast pockets and bilateral expanders / ADM were removed 02/11/16. She was treated with antibiotics and showed resolution.  She then underwent replacement of bilateral tissue expanders and ADM for bilateralbreast reconstruction on 05/29/16.   Past Medical History:  Diagnosis Date  . Anxiety   . Arthritis    hands and knees  . 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 Depression   . Ovarian cyst    "they come and go" (01/09/2016)  . PONV (postoperative nausea and vomiting)   . Urgency of urination     Past Surgical History:  Procedure Laterality Date  . ABDOMINAL HYSTERECTOMY  05/1996   endometriosis  . ACHILLES TENDON REPAIR Right 2010  . APPENDECTOMY  [id number]   Early mucocele of appendix,  removal of small leiomyoma from pelvis, lysis of adhesions  . BREAST BIOPSY Left 10/2015  . BREAST RECONSTRUCTION WITH PLACEMENT OF TISSUE EXPANDER AND FLEX HD (ACELLULAR HYDRATED DERMIS) Bilateral 01/09/2016  . BREAST RECONSTRUCTION WITH PLACEMENT OF TISSUE EXPANDER AND FLEX HD (ACELLULAR HYDRATED DERMIS) Bilateral 01/09/2016   Procedure: BREAST RECONSTRUCTION WITH PLACEMENT OF TISSUE EXPANDER AND FLEX HD (ACELLULAR HYDRATED DERMIS);  Surgeon: Loel Lofty Dillingham, DO;  Location: [facility];  Service: Plastics;  Laterality: Bilateral;  . BREAST RECONSTRUCTION WITH PLACEMENT OF TISSUE EXPANDER AND FLEX HD (ACELLULAR HYDRATED DERMIS) Bilateral 05/29/2016   Procedure: PLACEMENT OF BILATERAL TISSUE EXPANDER AND FLEX HD (ACELLULAR HYDRATED DERMIS);  Surgeon: Wallace Going, DO;  Location: [facility];  Service: Plastics;  Laterality: Bilateral;  . [place]; 1989; 1991  . INCISION AND DRAINAGE OF WOUND Bilateral 02/11/2016   Procedure: IRRIGATION AND DEBRIDEMENT OF BILATERAL BREAST POCKET;  Surgeon: Wallace Going, DO;  Location: [facility];  Service: Plastics;  Laterality: Bilateral;  . LAPAROSCOPIC CHOLECYSTECTOMY  ~ 1999  . MASTECTOMY Bilateral 01/09/2016  . NIPPLE SPARING MASTECTOMY/SENTINAL LYMPH NODE BIOPSY/RECONSTRUCTION/PLACEMENT OF TISSUE EXPANDER Bilateral 01/09/2016   Procedure: BILATERAL NIPPLE SPARING MASTECTOMY WITH LEFT SENTINAL LYMPH NODE BIOPSY ;  Surgeon: Stark Klein, MD;  Location: [facility];  Service: General;  Laterality: Bilateral;  . REMOVAL OF BILATERAL TISSUE EXPANDERS WITH PLACEMENT OF BILATERAL BREAST IMPLANTS Bilateral 08/20/2016   Procedure: REMOVAL OF BILATERAL TISSUE EXPANDERS WITH PLACEMENT OF BILATERAL SILICONE IMPLANTS;  Surgeon: Loel Lofty Dillingham, DO;  Location: [facility];  Service: Plastics;  Laterality: Bilateral;  . REMOVAL OF TISSUE EXPANDER Bilateral 02/11/2016   Procedure: REMOVAL OF BILATERAL TISSUE EXPANDERS AND  FLEX HD REMOVAL;  Surgeon: Wallace Going, DO;  Location: [facility];  Service: Plastics;  Laterality: Bilateral;  . TUBAL LIGATION  1991    Family History  Problem Relation Age of Onset  . Heart failure Father   . Prostate cancer Father 32  . Colon polyps Mother     approx 2  . Other Mother     hx HPV and hysterectomy due to precancerous cells  . Other Sister 74    hx of hysterectomy for unspecified reason; still has ovaries  . Other Sister 41    paternal half-sister hx of hysterectomy for unspecified reason; still has ovaries  . Bladder Cancer Maternal Uncle 79    not a smoker  . Kidney failure Maternal Grandmother   . Congestive Heart Failure Maternal Grandmother   . Colon cancer Maternal Grandmother 83  . Lung cancer Maternal Grandfather 54    smoker  . Breast cancer Paternal Grandmother     dx. early 24s; w/ hx of trauma to breast  . Crohn's disease Daughter   . Lung cancer Maternal Uncle 78    smoker   Social History:  reports that she quit smoking about 8 years ago. Her smoking use included Cigarettes. She has a 10.00 pack-year smoking history. She has never used smokeless tobacco. She reports that she does not drink alcohol or use drugs.  Allergies: No Known Allergies   (Not in a hospital admission)  No results found for this or any previous visit (from the past 48 hour(s)). No results found.  Review of Systems  Constitutional: Negative.   HENT: Negative.   Eyes: Negative.   Respiratory: Negative.   Cardiovascular: Negative.   Gastrointestinal: Negative.   Genitourinary: Negative.   Musculoskeletal: Negative.   Skin: Negative.   Neurological: Negative.   Psychiatric/Behavioral: Negative.     There were no vitals taken for this visit. Physical Exam  Constitutional: She appears well-developed and well-nourished.  HENT:  Head: Normocephalic and atraumatic.  Eyes: EOM are normal. Pupils are equal, round, and reactive to light.   Cardiovascular: Normal rate.   Respiratory: Effort normal. No respiratory distress.  GI: Soft. She exhibits no distension. There is no tenderness.  Neurological: She is alert.  Skin: No rash noted. No erythema. No pallor.  Psychiatric: She has a normal mood and affect. Her behavior is normal. Judgment and thought content normal.     Assessment/Plan Breast asymmetry following reconstructive surgery - Plan: SCD's, Informed Consent Details: Transcribe to consent form and obtain patient signature, Initiate Pre-op Protocol, Pre-admission testing diagnosis, N/A-Follow Short Stay CHG Process, Labs per anesthesia, Diet NPO time specified, ceFAZolin (ANCEF) 2 g in dextrose 5 % 100 mL IVPB  Plan release of bilateral breast capsular contracture and lipo-filling bilateral breasts to improve symmetry.   The risks that can be encountered with and after liposuction/lipo-filling were discussed and include the following but no limited to these:  Asymmetry, fluid accumulation, firmness of the area, fat necrosis with death of fat tissue, bleeding, infection, delayed healing, anesthesia risks, skin sensation changes, injury to structures including nerves, blood vessels, and muscles which may be temporary or permanent, allergies to tape, suture materials and glues, blood products, topical preparations or injected agents, skin and contour irregularities, skin discoloration and swelling, deep vein thrombosis, cardiac and pulmonary complications, pain, which may  persist, persistent pain, recurrence of the lesion, poor healing of the incision, possible need for revisional surgery or staged procedures. There can also be persistent swelling, poor wound healing, rippling or loose skin, worsening of cellulite, and swelling. Any change in weight fluctuations can alter the outcome. The patient's questions were answered and she desires to proceed and consent was obtained.  Wallace Going, DO 11/21/2016, 7:09 AM

## 2016-11-27 ENCOUNTER — Encounter (HOSPITAL_BASED_OUTPATIENT_CLINIC_OR_DEPARTMENT_OTHER): Payer: Self-pay | Admitting: Plastic Surgery

## 2016-12-03 ENCOUNTER — Telehealth: Payer: Self-pay | Admitting: Emergency Medicine

## 2016-12-03 NOTE — Telephone Encounter (Signed)
Patient called to requesting date and time of upcoming appointment; Per our records patient missed her office visit with Dr Lindi Adie 3/13. Gave patient new appointment for 3/19 at 2:00; patient verbalized understanding.

## 2016-12-04 ENCOUNTER — Ambulatory Visit: Payer: BLUE CROSS/BLUE SHIELD | Admitting: Hematology and Oncology

## 2016-12-04 NOTE — Assessment & Plan Note (Deleted)
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 Breast exam 12/04/2016: Normal 2. Mammogram to be done in May 2018   Return to clinic in 1 year for follow-up for breast exams and surveillance

## 2016-12-05 DIAGNOSIS — C50912 Malignant neoplasm of unspecified site of left female breast: Secondary | ICD-10-CM | POA: Diagnosis not present

## 2016-12-30 DIAGNOSIS — L298 Other pruritus: Secondary | ICD-10-CM | POA: Diagnosis not present

## 2016-12-31 ENCOUNTER — Encounter: Payer: Self-pay | Admitting: Hematology and Oncology

## 2016-12-31 ENCOUNTER — Ambulatory Visit (HOSPITAL_BASED_OUTPATIENT_CLINIC_OR_DEPARTMENT_OTHER): Payer: BLUE CROSS/BLUE SHIELD | Admitting: Hematology and Oncology

## 2016-12-31 DIAGNOSIS — Z170 Estrogen receptor positive status [ER+]: Secondary | ICD-10-CM | POA: Diagnosis not present

## 2016-12-31 DIAGNOSIS — C50112 Malignant neoplasm of central portion of left female breast: Secondary | ICD-10-CM

## 2016-12-31 MED ORDER — TAMOXIFEN CITRATE 20 MG PO TABS: 20.0000 mg | ORAL_TABLET | Freq: Every day | ORAL | 3 refills | Status: DC

## 2016-12-31 NOTE — Progress Notes (Signed)
Patient Care Team: Maurice Small, MD as PCP - General (Family Medicine) Megan Salon, MD as Consulting Physician (Gynecology) Sylvan Cheese, NP as Nurse Practitioner (Hematology and Oncology)  DIAGNOSIS:  Encounter Diagnosis  Name Primary?  . 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 p(m)T1b, pN0: Stage IA; RIGHT mastectomy: PASH       01/09/2016 Oncotype testing    Oncotype score: 5; 5% ROR      02/2016 -  Anti-estrogen oral therapy    Tamoxifen 20 mg daily.        CHIEF COMPLIANT: Follow-up on tamoxifen therapy  INTERVAL HISTORY: Christina [name] is a 6 year with above-mentioned history of ATM gene mutation who was diagnosed with breast cancer underwent bilateral mastectomies and is currently on tamoxifen. She is tolerating it extremely well. She does have hot flashes intermittently. Denies any myalgias. Denies any lumps or nodules. She has no appointment to see Dr. Man with gastroenterology for  colonoscopy.  REVIEW OF SYSTEMS:   Constitutional: Denies fevers, chills or abnormal weight loss Eyes: Denies blurriness of vision Ears, nose, mouth, throat, and face: Denies mucositis or sore throat Respiratory: Denies cough, dyspnea or wheezes Cardiovascular: Denies palpitation, chest discomfort Gastrointestinal:  Denies nausea, heartburn or change in bowel habits Skin: Denies abnormal skin rashes Lymphatics: Denies new lymphadenopathy or easy bruising Neurological:Denies numbness, tingling or new weaknesses Behavioral/Psych: Mood is stable, no new changes  Extremities: No lower extremity edema Breast:  denies any pain or lumps or nodules in either breasts All other systems were reviewed with the patient and are negative.  I have reviewed the past medical history, past surgical history, social history and family history with the patient and they are unchanged from previous note.  ALLERGIES:  has No Known Allergies.  MEDICATIONS:  Current Outpatient Prescriptions  Medication Sig Dispense Refill  . amitriptyline (ELAVIL) 100 MG tablet Take 100 mg by mouth at bedtime.    . Ascorbic Acid (VITAMIN C) 1000 MG tablet Take 1,000 mg by mouth daily.    Marland Kitchen b complex vitamins tablet Take 1 tablet by mouth daily.    . Multiple Vitamin (MULTIVITAMIN) tablet Take 1 tablet by mouth daily.    . tamoxifen (NOLVADEX) 20 MG tablet Take 1 tablet (20 mg total) by mouth daily. 30 tablet 12  . venlafaxine XR (EFFEXOR-XR) 75 MG 24 hr capsule Take 1 capsule (75 mg total) by mouth daily with breakfast. 30 capsule 6   No current facility-administered medications for this visit.  PHYSICAL EXAMINATION: ECOG PERFORMANCE STATUS: 1 - Symptomatic but completely ambulatory  Vitals:   12/31/16 1409  BP: (!) 125/55  Pulse: 79  Resp: 18  Temp: 98.4 F (36.9 C)   Filed Weights   12/31/16 1409  Weight: 202 lb (91.6 kg)    GENERAL:alert, no distress and comfortable SKIN: skin color, texture, turgor are  normal, no rashes or significant lesions EYES: normal, Conjunctiva are pink and non-injected, sclera clear OROPHARYNX:no exudate, no erythema and lips, buccal mucosa, and tongue normal  NECK: supple, thyroid normal size, non-tender, without nodularity LYMPH:  no palpable lymphadenopathy in the cervical, axillary or inguinal LUNGS: clear to auscultation and percussion with normal breathing effort HEART: regular rate & rhythm and no murmurs and no lower extremity edema ABDOMEN:abdomen soft, non-tender and normal bowel sounds MUSCULOSKELETAL:no cyanosis of digits and no clubbing  NEURO: alert & oriented x 3 with fluent speech, no focal motor/sensory deficits EXTREMITIES: No lower extremity edema BREAST no palpable lumps or nodules in bilateral chest wall or axilla. (exam performed in the presence of a chaperone)  LABORATORY DATA:  I have reviewed the data as listed   Chemistry      Component Value Date/Time   NA 136 01/27/2016 2225   NA 142 11/07/2015 0839   K 3.9 01/27/2016 2225   K 4.7 11/07/2015 0839   CL 107 01/27/2016 2225   CO2 22 01/27/2016 2225   CO2 26 11/07/2015 0839   BUN 11 01/27/2016 2225   BUN 12.2 11/07/2015 0839   CREATININE 0.86 01/27/2016 2225   CREATININE 0.9 11/07/2015 0839      Component Value Date/Time   CALCIUM 9.6 01/27/2016 2225   CALCIUM 9.9 11/07/2015 0839   ALKPHOS 54 01/27/2016 2225   ALKPHOS 49 11/07/2015 0839   AST 16 01/27/2016 2225   AST 15 11/07/2015 0839   ALT 18 01/27/2016 2225   ALT 16 11/07/2015 0839   BILITOT 0.4 01/27/2016 2225   BILITOT 0.37 11/07/2015 0839       Lab Results  Component Value Date   WBC 14.2 (H) 01/27/2016   HGB 11.8 (L) 01/27/2016   HCT 37.0 01/27/2016   MCV 86.9 01/27/2016   PLT 376 01/27/2016   NEUTROABS 11.2 (H) 01/27/2016    ASSESSMENT & PLAN:  Malignant neoplasm of central portion of left breast in female, estrogen receptor positive ([place]) Left mastectomy 01/09/2016: Multifocal IDC 0.6cm (grade 2), 0.4,  0.3 cm (grade 1), IG-DCIS and sep focus of HG DCIS; right mastectomy:PASH, 0/4 Lt Axill LN Neg ER/PR HER-2 are being retested 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 Denies any myalgias (patient had a prior hysterectomy)  Surveillance: No role of mammograms since she had bil mastectomies Chest exam: 12/31/16: No palpable lumps in chest wall or axilla.  Return to clinic in 1 year for follow-up and breast exams and surveillance  I spent 25 minutes talking to the patient of which more than half was spent in counseling and coordination of care.  No orders of the defined types were placed in this encounter.  The patient has a good understanding of the overall plan. she agrees with it. she will call with any problems that may develop before the next visit here.   Rulon Eisenmenger, MD 12/31/16

## 2016-12-31 NOTE — Assessment & Plan Note (Signed)
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  Return to clinic in 1 year for follow-up and breast exams and surveillance

## 2017-01-06 DIAGNOSIS — R140 Abdominal distension (gaseous): Secondary | ICD-10-CM | POA: Diagnosis not present

## 2017-01-06 DIAGNOSIS — Z800 Family history of malignant neoplasm of digestive organs: Secondary | ICD-10-CM | POA: Diagnosis not present

## 2017-01-06 DIAGNOSIS — Z1211 Encounter for screening for malignant neoplasm of colon: Secondary | ICD-10-CM | POA: Diagnosis not present

## 2017-01-06 DIAGNOSIS — K5904 Chronic idiopathic constipation: Secondary | ICD-10-CM | POA: Diagnosis not present

## 2017-01-15 ENCOUNTER — Encounter (HOSPITAL_BASED_OUTPATIENT_CLINIC_OR_DEPARTMENT_OTHER): Payer: Self-pay | Admitting: *Deleted

## 2017-01-16 ENCOUNTER — Ambulatory Visit: Payer: Self-pay | Admitting: Plastic Surgery

## 2017-01-16 DIAGNOSIS — N651 Disproportion of reconstructed breast: Secondary | ICD-10-CM

## 2017-01-16 DIAGNOSIS — C50912 Malignant neoplasm of unspecified site of left female breast: Secondary | ICD-10-CM | POA: Diagnosis not present

## 2017-01-21 ENCOUNTER — Ambulatory Visit (HOSPITAL_BASED_OUTPATIENT_CLINIC_OR_DEPARTMENT_OTHER): Payer: BLUE CROSS/BLUE SHIELD | Admitting: Anesthesiology

## 2017-01-21 ENCOUNTER — Encounter (HOSPITAL_BASED_OUTPATIENT_CLINIC_OR_DEPARTMENT_OTHER): Payer: Self-pay | Admitting: *Deleted

## 2017-01-21 ENCOUNTER — Encounter (HOSPITAL_BASED_OUTPATIENT_CLINIC_OR_DEPARTMENT_OTHER)
Admission: RE | Disposition: A | Discharge status: Home / Self Care | Payer: Self-pay | Source: Ambulatory Visit | Attending: Plastic Surgery

## 2017-01-21 ENCOUNTER — Ambulatory Visit (HOSPITAL_BASED_OUTPATIENT_CLINIC_OR_DEPARTMENT_OTHER)
Admission: RE | Admit: 2017-01-21 | Discharge: 2017-01-21 | Disposition: A | Discharge status: Home / Self Care | Payer: BLUE CROSS/BLUE SHIELD | Source: Ambulatory Visit | Attending: Plastic Surgery | Admitting: Plastic Surgery

## 2017-01-21 DIAGNOSIS — Z853 Personal history of malignant neoplasm of breast: Secondary | ICD-10-CM | POA: Insufficient documentation

## 2017-01-21 DIAGNOSIS — N651 Disproportion of reconstructed breast: Secondary | ICD-10-CM

## 2017-01-21 DIAGNOSIS — Z9013 Acquired absence of bilateral breasts and nipples: Secondary | ICD-10-CM | POA: Diagnosis not present

## 2017-01-21 DIAGNOSIS — N6489 Other specified disorders of breast: Secondary | ICD-10-CM | POA: Insufficient documentation

## 2017-01-21 DIAGNOSIS — T8544XA Capsular contracture of breast implant, initial encounter: Secondary | ICD-10-CM | POA: Diagnosis not present

## 2017-01-21 HISTORY — PX: LIPOSUCTION WITH LIPOFILLING: SHX6436

## 2017-01-21 SURGERY — LIPOSUCTION, WITH FAT TRANSFER
Anesthesia: General | Site: Breast | Laterality: Bilateral

## 2017-01-21 MED ORDER — SCOPOLAMINE 1 MG/3DAYS TD PT72
MEDICATED_PATCH | TRANSDERMAL | Status: AC
  Filled 2017-01-21: qty 1

## 2017-01-21 MED ORDER — SODIUM CHLORIDE 0.9 % IV SOLN: 250.0000 mL | INTRAVENOUS | Status: DC | PRN

## 2017-01-21 MED ORDER — DEXAMETHASONE SODIUM PHOSPHATE 4 MG/ML IJ SOLN
INTRAMUSCULAR | Status: DC | PRN
Start: 2017-01-21 — End: 2017-01-21
  Administered 2017-01-21: 10 mg via INTRAVENOUS

## 2017-01-21 MED ORDER — ONDANSETRON HCL 4 MG/2ML IJ SOLN
INTRAMUSCULAR | Status: AC
  Filled 2017-01-21: qty 2

## 2017-01-21 MED ORDER — SCOPOLAMINE 1 MG/3DAYS TD PT72: 1.0000 | MEDICATED_PATCH | Freq: Once | TRANSDERMAL | Status: DC | PRN

## 2017-01-21 MED ORDER — EPINEPHRINE 30 MG/30ML IJ SOLN
INTRAMUSCULAR | Status: AC
  Filled 2017-01-21: qty 1

## 2017-01-21 MED ORDER — MIDAZOLAM HCL 2 MG/2ML IJ SOLN
INTRAMUSCULAR | Status: AC
  Filled 2017-01-21: qty 2

## 2017-01-21 MED ORDER — OXYCODONE HCL 5 MG/5ML PO SOLN: 5.0000 mg | Freq: Once | ORAL | Status: DC | PRN

## 2017-01-21 MED ORDER — PROPOFOL 10 MG/ML IV BOLUS
INTRAVENOUS | Status: DC | PRN
  Administered 2017-01-21: 200 mg via INTRAVENOUS

## 2017-01-21 MED ORDER — SUFENTANIL CITRATE 50 MCG/ML IV SOLN
INTRAVENOUS | Status: AC
  Filled 2017-01-21: qty 1

## 2017-01-21 MED ORDER — MIDAZOLAM HCL 2 MG/2ML IJ SOLN
1.0000 mg | INTRAMUSCULAR | Status: DC | PRN
Start: 2017-01-21 — End: 2017-01-21

## 2017-01-21 MED ORDER — MIDAZOLAM HCL 5 MG/5ML IJ SOLN
INTRAMUSCULAR | Status: DC | PRN
  Administered 2017-01-21: 2 mg via INTRAVENOUS

## 2017-01-21 MED ORDER — EPINEPHRINE PF 1 MG/ML IJ SOLN
INTRAMUSCULAR | Status: DC | PRN
  Administered 2017-01-21: 1000 mL

## 2017-01-21 MED ORDER — HYDROMORPHONE HCL 1 MG/ML IJ SOLN
INTRAMUSCULAR | Status: AC
  Filled 2017-01-21: qty 1

## 2017-01-21 MED ORDER — LIDOCAINE-EPINEPHRINE 1 %-1:100000 IJ SOLN
INTRAMUSCULAR | Status: AC
  Filled 2017-01-21: qty 2

## 2017-01-21 MED ORDER — SODIUM CHLORIDE 0.9% FLUSH: 3.0000 mL | INTRAVENOUS | Status: DC | PRN

## 2017-01-21 MED ORDER — ACETAMINOPHEN 650 MG RE SUPP: 650.0000 mg | RECTAL | Status: DC | PRN

## 2017-01-21 MED ORDER — LIDOCAINE HCL 1 % IJ SOLN
INTRAMUSCULAR | Status: AC
  Filled 2017-01-21: qty 40

## 2017-01-21 MED ORDER — HYDROMORPHONE HCL 1 MG/ML IJ SOLN
0.2500 mg | INTRAMUSCULAR | Status: DC | PRN
  Administered 2017-01-21: 0.5 mg via INTRAVENOUS

## 2017-01-21 MED ORDER — ONDANSETRON HCL 4 MG/2ML IJ SOLN: 4.0000 mg | Freq: Four times a day (QID) | INTRAMUSCULAR | Status: DC | PRN

## 2017-01-21 MED ORDER — BUPIVACAINE HCL (PF) 0.25 % IJ SOLN
INTRAMUSCULAR | Status: AC
Start: 2017-01-21 — End: 2017-01-21
  Filled 2017-01-21: qty 30

## 2017-01-21 MED ORDER — SODIUM CHLORIDE 0.9 % IJ SOLN
INTRAMUSCULAR | Status: AC
  Filled 2017-01-21: qty 10

## 2017-01-21 MED ORDER — CEFAZOLIN SODIUM-DEXTROSE 2-4 GM/100ML-% IV SOLN
INTRAVENOUS | Status: AC
  Filled 2017-01-21: qty 100

## 2017-01-21 MED ORDER — SUCCINYLCHOLINE CHLORIDE 200 MG/10ML IV SOSY
PREFILLED_SYRINGE | INTRAVENOUS | Status: AC
  Filled 2017-01-21: qty 10

## 2017-01-21 MED ORDER — LACTATED RINGERS IV SOLN
INTRAVENOUS | Status: DC | PRN
  Administered 2017-01-21 (×2): via INTRAVENOUS

## 2017-01-21 MED ORDER — OXYCODONE HCL 5 MG PO TABS: 5.0000 mg | ORAL_TABLET | ORAL | Status: DC | PRN

## 2017-01-21 MED ORDER — PHENYLEPHRINE 40 MCG/ML (10ML) SYRINGE FOR IV PUSH (FOR BLOOD PRESSURE SUPPORT)
PREFILLED_SYRINGE | INTRAVENOUS | Status: AC
  Filled 2017-01-21: qty 10

## 2017-01-21 MED ORDER — LIDOCAINE-EPINEPHRINE 1 %-1:100000 IJ SOLN
INTRAMUSCULAR | Status: DC | PRN
  Administered 2017-01-21: 5 mL

## 2017-01-21 MED ORDER — OXYCODONE HCL 5 MG PO TABS: 5.0000 mg | ORAL_TABLET | Freq: Once | ORAL | Status: DC | PRN

## 2017-01-21 MED ORDER — LIDOCAINE 2% (20 MG/ML) 5 ML SYRINGE
INTRAMUSCULAR | Status: AC
  Filled 2017-01-21: qty 5

## 2017-01-21 MED ORDER — LACTATED RINGERS IV SOLN: INTRAVENOUS | Status: DC

## 2017-01-21 MED ORDER — DEXAMETHASONE SODIUM PHOSPHATE 10 MG/ML IJ SOLN
INTRAMUSCULAR | Status: AC
  Filled 2017-01-21: qty 1

## 2017-01-21 MED ORDER — LIDOCAINE HCL (CARDIAC) 20 MG/ML IV SOLN
INTRAVENOUS | Status: DC | PRN
Start: 2017-01-21 — End: 2017-01-21
  Administered 2017-01-21: 60 mg via INTRAVENOUS

## 2017-01-21 MED ORDER — ACETAMINOPHEN 325 MG PO TABS: 650.0000 mg | ORAL_TABLET | ORAL | Status: DC | PRN

## 2017-01-21 MED ORDER — EPHEDRINE 5 MG/ML INJ
INTRAVENOUS | Status: AC
  Filled 2017-01-21: qty 10

## 2017-01-21 MED ORDER — SUFENTANIL CITRATE 50 MCG/ML IV SOLN
INTRAVENOUS | Status: DC | PRN
  Administered 2017-01-21: 5 ug via INTRAVENOUS
  Administered 2017-01-21: 10 ug via INTRAVENOUS
  Administered 2017-01-21: 5 ug via INTRAVENOUS

## 2017-01-21 MED ORDER — ONDANSETRON HCL 4 MG/2ML IJ SOLN
INTRAMUSCULAR | Status: DC | PRN
  Administered 2017-01-21: 4 mg via INTRAVENOUS

## 2017-01-21 MED ORDER — CEFAZOLIN SODIUM-DEXTROSE 2-3 GM-% IV SOLR
INTRAVENOUS | Status: DC | PRN
  Administered 2017-01-21: 2 g via INTRAVENOUS

## 2017-01-21 MED ORDER — FENTANYL CITRATE (PF) 100 MCG/2ML IJ SOLN: 50.0000 ug | INTRAMUSCULAR | Status: DC | PRN

## 2017-01-21 MED ORDER — SODIUM CHLORIDE 0.9% FLUSH: 3.0000 mL | Freq: Two times a day (BID) | INTRAVENOUS | Status: DC

## 2017-01-21 MED ORDER — CEFAZOLIN SODIUM-DEXTROSE 2-4 GM/100ML-% IV SOLN: 2.0000 g | INTRAVENOUS | Status: DC

## 2017-01-21 SURGICAL SUPPLY — 48 items
BINDER ABDOMINAL  9 SM 30-45 (SOFTGOODS)
BINDER ABDOMINAL 10 UNV 27-48 (MISCELLANEOUS) ×2 IMPLANT
BINDER ABDOMINAL 12 SM 30-45 (SOFTGOODS) IMPLANT
BINDER ABDOMINAL 9 SM 30-45 (SOFTGOODS) IMPLANT
BINDER BREAST LRG (GAUZE/BANDAGES/DRESSINGS) ×2 IMPLANT
BINDER BREAST MEDIUM (GAUZE/BANDAGES/DRESSINGS) IMPLANT
BINDER BREAST XLRG (GAUZE/BANDAGES/DRESSINGS) IMPLANT
BINDER BREAST XXLRG (GAUZE/BANDAGES/DRESSINGS) IMPLANT
BLADE HEX COATED 2.75 (ELECTRODE) IMPLANT
BLADE SURG 15 STRL LF DISP TIS (BLADE) ×1 IMPLANT
BLADE SURG 15 STRL SS (BLADE) ×1
BNDG GAUZE ELAST 4 BULKY (GAUZE/BANDAGES/DRESSINGS) IMPLANT
CHLORAPREP W/TINT 26ML (MISCELLANEOUS) ×2 IMPLANT
COVER BACK TABLE 60X90IN (DRAPES) ×2 IMPLANT
COVER MAYO STAND STRL (DRAPES) ×2 IMPLANT
DECANTER SPIKE VIAL GLASS SM (MISCELLANEOUS) IMPLANT
DERMABOND ADVANCED (GAUZE/BANDAGES/DRESSINGS) ×1
DERMABOND ADVANCED .7 DNX12 (GAUZE/BANDAGES/DRESSINGS) ×1 IMPLANT
DRAPE LAPAROSCOPIC ABDOMINAL (DRAPES) ×2 IMPLANT
DRSG PAD ABDOMINAL 8X10 ST (GAUZE/BANDAGES/DRESSINGS) ×4 IMPLANT
ELECT REM PT RETURN 9FT ADLT (ELECTROSURGICAL) ×2
ELECTRODE REM PT RTRN 9FT ADLT (ELECTROSURGICAL) ×1 IMPLANT
EXTRACTOR CANIST REVOLVE STRL (CANNISTER) ×2 IMPLANT
FILTER LIPOSUCTION (MISCELLANEOUS) ×2 IMPLANT
GLOVE BIO SURGEON STRL SZ 6.5 (GLOVE) ×8 IMPLANT
GOWN STRL REUS W/ TWL LRG LVL3 (GOWN DISPOSABLE) ×3 IMPLANT
GOWN STRL REUS W/TWL LRG LVL3 (GOWN DISPOSABLE) ×3
IV LACTATED RINGERS 1000ML (IV SOLUTION) ×8 IMPLANT
LINER CANISTER 1000CC FLEX (MISCELLANEOUS) ×12 IMPLANT
NDL SAFETY ECLIPSE 18X1.5 (NEEDLE) ×2 IMPLANT
NEEDLE HYPO 18GX1.5 SHARP (NEEDLE) ×2
NEEDLE HYPO 25X1 1.5 SAFETY (NEEDLE) ×2 IMPLANT
PACK BASIN DAY SURGERY FS (CUSTOM PROCEDURE TRAY) ×2 IMPLANT
PAD ALCOHOL SWAB (MISCELLANEOUS) ×4 IMPLANT
PENCIL BUTTON HOLSTER BLD 10FT (ELECTRODE) IMPLANT
SLEEVE SCD COMPRESS KNEE MED (MISCELLANEOUS) ×2 IMPLANT
SPONGE LAP 18X18 X RAY DECT (DISPOSABLE) ×2 IMPLANT
SUT MNCRL AB 4-0 PS2 18 (SUTURE) IMPLANT
SUT MON AB 5-0 PS2 18 (SUTURE) ×4 IMPLANT
SYR 10ML LL (SYRINGE) ×8 IMPLANT
SYR 3ML 18GX1 1/2 (SYRINGE) IMPLANT
SYR 50ML LL SCALE MARK (SYRINGE) ×4 IMPLANT
SYR CONTROL 10ML LL (SYRINGE) ×2 IMPLANT
SYR TOOMEY 50ML (SYRINGE) ×4 IMPLANT
TOWEL OR 17X24 6PK STRL BLUE (TOWEL DISPOSABLE) ×4 IMPLANT
TUBING INFILTRATION IT-10001 (TUBING) IMPLANT
TUBING SET GRADUATE ASPIR 12FT (MISCELLANEOUS) ×2 IMPLANT
UNDERPAD 30X30 (UNDERPADS AND DIAPERS) ×4 IMPLANT

## 2017-01-21 NOTE — Op Note (Signed)
Op report Breast Surgery   DATE OF OPERATION:  01/21/2017  LOCATION: [facility]  SURGICAL DIVISION: Plastic Surgery  PREOPERATIVE DIAGNOSES:  1. Breast asymmetry after breast cancer and reconstruction 2. History of left breast cancer.  3. Acquired absence of bilateral breast.  4. Capsule contracture.  POSTOPERATIVE DIAGNOSES:  1. Breast asymmetry after breast cancer and reconstruction 2. History of left breast cancer.  3. Acquired absence of bilateral breast.  4. Capsule contracture  PROCEDURE:  1. Release of bilateral capsular contracture. 2. Lipofilling bilateral breast for symmetry.  SURGEON: Claire Sanger Dillingham, DO  ASSISTANT: Shawn Rayburn, PA  ANESTHESIA:  General.   COMPLICATIONS: None.   INDICATIONS FOR PROCEDURE:  The patient, Christina [name], is a 51 y.o. female born on 1965/10/15, is here for treatment after a mastectomy and reconstruction with expanders followed by implants.  She has breast asymmetry and capsule contractures as well.   MRN: [id number]  CONSENT:  Informed consent was obtained directly from the patient. Risks, benefits and alternatives were fully discussed. Specific risks including but not limited to bleeding, infection, hematoma, seroma, scarring, pain, implant infection, implant extrusion, capsular contracture, asymmetry, wound healing problems, and need for further surgery were all discussed. The patient did have an ample opportunity to have her questions answered to her satisfaction.   DESCRIPTION OF PROCEDURE:  The patient was taken to the operating room. SCDs were placed and IV antibiotics were given. The patient's chest and abdomen were prepped and draped in a sterile fashion. A time out was performed and the implants to be used were identified.  One percent Xylocaine with epinephrine was used to infiltrate the skin at the incision site of the umbilicus and medial breast.   The #15 blade was used to make a 1 cm  incision at the lower portion of the umbilicus in an old scar site.  The tumescent was infused into the fatty layer.  After waiting several minutes for the local to take effect the liposuction was performed to harvest the adipose.  The Revolve was used to prepared the tissue.  While this was being done, attention was turned to the breasts.  The sharp cannula was used to release the capsule contracture between the capsule and skin on both breasts at the inferior poll.  This improved the contour of the breasts.  The fat was then injected for 81 cc on the right and 125 cc on the left. This was done at the superior and superior lateral area and then the inferior poll on both sides.  The skin was closed with 5-0 Monocryl deep and then at the skin.  A breast binder and ABD was applied.  The patient was allowed to wake from anesthesia and taken to the recovery room in satisfactory condition.

## 2017-01-21 NOTE — Discharge Instructions (Addendum)
No heavy lifting Continue binder or sports bra spanx for abdominal area Has pain meds at home.  Call your surgeon if you experience:   1.  Fever over 101.0. 2.  Inability to urinate. 3.  Nausea and/or vomiting. 4.  Extreme swelling or bruising at the surgical site. 5.  Continued bleeding from the incision. 6.  Increased pain, redness or drainage from the incision. 7.  Problems related to your pain medication. 8.  Any problems and/or concerns Post Anesthesia Home Care Instructions  Activity: Get plenty of rest for the remainder of the day. A responsible individual must stay with you for 24 hours following the procedure.  For the next 24 hours, DO NOT: -Drive a car -Paediatric nurse -Drink alcoholic beverages -Take any medication unless instructed by your physician -Make any legal decisions or sign important papers.  Meals: Start with liquid foods such as gelatin or soup. Progress to regular foods as tolerated. Avoid greasy, spicy, heavy foods. If nausea and/or vomiting occur, drink only clear liquids until the nausea and/or vomiting subsides. Call your physician if vomiting continues.  Special Instructions/Symptoms: Your throat may feel dry or sore from the anesthesia or the breathing tube placed in your throat during surgery. If this causes discomfort, gargle with warm salt water. The discomfort should disappear within 24 hours.  If you had a scopolamine patch placed behind your ear for the management of post- operative nausea and/or vomiting:  1. The medication in the patch is effective for 72 hours, after which it should be removed.  Wrap patch in a tissue and discard in the trash. Wash hands thoroughly with soap and water. 2. You may remove the patch earlier than 72 hours if you experience unpleasant side effects which may include dry mouth, dizziness or visual disturbances. 3. Avoid touching the patch. Wash your hands with soap and water after contact with the patch.

## 2017-01-21 NOTE — H&P (Signed)
Christina [name] is an 51 y.o. female.   Chief Complaint: breast asymmetry HPI: The patient is a 51 yrs old wf here for further reconstruction for symmetry of breasts.  She underwent bilateral nipple sparing mastectomies for a LEFT breast cancer.  She had immediate reconstruction with expanders and FlexHD.  There were complications and she had explantation and replantation.  She did well and went on to exchange the expanders with silicone implants.  She now presents for more symmetry.  Past Medical History:  Diagnosis Date  . Anxiety   . Arthritis    hands and knees  . 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 Depression   . Ovarian cyst    "they come and go" (01/09/2016)  . PONV (postoperative nausea and vomiting)   . Urgency of urination     Past Surgical History:  Procedure Laterality Date  . ABDOMINAL HYSTERECTOMY  05/1996   endometriosis  . ACHILLES TENDON REPAIR Right 2010  . APPENDECTOMY  [id number]   Early mucocele of appendix, removal of small leiomyoma from pelvis, lysis of adhesions  . BREAST BIOPSY Left 10/2015  . BREAST RECONSTRUCTION WITH PLACEMENT OF TISSUE EXPANDER AND FLEX HD (ACELLULAR HYDRATED DERMIS) Bilateral 01/09/2016  . BREAST RECONSTRUCTION WITH PLACEMENT OF TISSUE EXPANDER AND FLEX HD (ACELLULAR HYDRATED DERMIS) Bilateral 01/09/2016   Procedure: BREAST RECONSTRUCTION WITH PLACEMENT OF TISSUE EXPANDER AND FLEX HD (ACELLULAR HYDRATED DERMIS);  Surgeon: Loel Lofty Dillingham, DO;  Location: [facility];  Service: Plastics;  Laterality: Bilateral;  . BREAST RECONSTRUCTION WITH PLACEMENT OF TISSUE EXPANDER AND FLEX HD (ACELLULAR HYDRATED DERMIS) Bilateral 05/29/2016   Procedure: PLACEMENT OF BILATERAL TISSUE EXPANDER AND FLEX HD (ACELLULAR HYDRATED DERMIS);  Surgeon: Wallace Going, DO;  Location: [facility];  Service: Plastics;  Laterality: Bilateral;  . BREAST REDUCTION SURGERY Bilateral 11/26/2016   Procedure: BILATERAL BREAST CAPSULE  CONTRACTURE RELASE;  Surgeon: Wallace Going, DO;  Location: [place];  Service: Plastics;  Laterality: Bilateral;  . [place]; 1989; 1991  . INCISION AND DRAINAGE OF WOUND Bilateral 02/11/2016   Procedure: IRRIGATION AND DEBRIDEMENT OF BILATERAL BREAST POCKET;  Surgeon: Wallace Going, DO;  Location: [place];  Service: Plastics;  Laterality: Bilateral;  . LAPAROSCOPIC CHOLECYSTECTOMY  ~ 1999  . LIPOSUCTION WITH LIPOFILLING Bilateral 11/26/2016   Procedure: LIPOFILLING FOR SYMMETRY;  Surgeon: Wallace Going, DO;  Location: [place];  Service: Plastics;  Laterality: Bilateral;  . MASTECTOMY Bilateral 01/09/2016  . NIPPLE SPARING MASTECTOMY/SENTINAL LYMPH NODE BIOPSY/RECONSTRUCTION/PLACEMENT OF TISSUE EXPANDER Bilateral 01/09/2016   Procedure: BILATERAL NIPPLE SPARING MASTECTOMY WITH LEFT SENTINAL LYMPH NODE BIOPSY ;  Surgeon: Stark Klein, MD;  Location: [place];  Service: General;  Laterality: Bilateral;  . REMOVAL OF BILATERAL TISSUE EXPANDERS WITH PLACEMENT OF BILATERAL BREAST IMPLANTS Bilateral 08/20/2016   Procedure: REMOVAL OF BILATERAL TISSUE EXPANDERS WITH PLACEMENT OF BILATERAL SILICONE IMPLANTS;  Surgeon: Wallace Going, DO;  Location: [place];  Service: Plastics;  Laterality: Bilateral;  . REMOVAL OF TISSUE EXPANDER Bilateral 02/11/2016   Procedure: REMOVAL OF BILATERAL TISSUE EXPANDERS AND FLEX HD REMOVAL;  Surgeon: Wallace Going, DO;  Location: [place];  Service: Plastics;  Laterality: Bilateral;  . TUBAL LIGATION  1991    Family History  Problem Relation Age of Onset  . Heart failure Father   . Prostate cancer Father 37  . Colon polyps Mother  approx 2  . Other Mother        hx HPV and hysterectomy due to precancerous cells  . Other Sister 84       hx of hysterectomy for unspecified reason; still has ovaries  . Other Sister 54       paternal half-sister  hx of hysterectomy for unspecified reason; still has ovaries  . Bladder Cancer Maternal Uncle 79       not a smoker  . Kidney failure Maternal Grandmother   . Congestive Heart Failure Maternal Grandmother   . Colon cancer Maternal Grandmother 77  . Lung cancer Maternal Grandfather 80       smoker  . Breast cancer Paternal Grandmother        dx. early 30s; w/ hx of trauma to breast  . Crohn's disease Daughter   . Lung cancer Maternal Uncle 27       smoker   Social History:  reports that she quit smoking about 8 years ago. Her smoking use included Cigarettes. She has a 10.00 pack-year smoking history. She has never used smokeless tobacco. She reports that she does not drink alcohol or use drugs.  Allergies: No Known Allergies  Medications Prior to Admission  Medication Sig Dispense Refill  . amitriptyline (ELAVIL) 100 MG tablet Take 100 mg by mouth at bedtime.    . tamoxifen (NOLVADEX) 20 MG tablet Take 1 tablet (20 mg total) by mouth daily. 90 tablet 3  . venlafaxine XR (EFFEXOR-XR) 75 MG 24 hr capsule Take 1 capsule (75 mg total) by mouth daily with breakfast. 30 capsule 6    No results found for this or any previous visit (from the past 48 hour(s)). No results found.  Review of Systems  Constitutional: Negative.   HENT: Negative.   Eyes: Negative.   Respiratory: Negative.   Cardiovascular: Negative.   Gastrointestinal: Negative.   Genitourinary: Negative.   Musculoskeletal: Negative.   Skin: Negative.   Neurological: Negative.   Psychiatric/Behavioral: Negative.     Blood pressure 106/66, pulse 66, temperature 98.6 F (37 C), temperature source Oral, resp. rate 20, height 5\' 7"  (1.702 m), weight 89.4 kg (197 lb), SpO2 100 %. Physical Exam  Constitutional: She appears well-developed and well-nourished.  HENT:  Head: Normocephalic and atraumatic.  Eyes: EOM are normal. Pupils are equal, round, and reactive to light.  Cardiovascular: Normal rate.   Respiratory: Effort  normal. No respiratory distress.  GI: Soft. She exhibits no distension.  Neurological: She is alert.  Skin: Skin is warm. No rash noted. No erythema. No pallor.  Psychiatric: She has a normal mood and affect. Her behavior is normal. Judgment and thought content normal.     Assessment/Plan Plan for lipofilling of breasts for contour and symmetry.  Wallace Going, DO 01/21/2017, 8:25 AM

## 2017-01-21 NOTE — Anesthesia Postprocedure Evaluation (Addendum)
Anesthesia Post Note  Patient: Christina [name]  Procedure(s) Performed: Procedure(s) (LRB): BILATERAL BREAST  LIPOFILLING FOR ASYMMETRY (Bilateral)  Patient location during evaluation: PACU Anesthesia Type: General Level of consciousness: awake and alert and patient cooperative Pain management: pain level controlled Vital Signs Assessment: post-procedure vital signs reviewed and stable Respiratory status: spontaneous breathing and respiratory function stable Cardiovascular status: stable Anesthetic complications: no       Last Vitals:  Vitals:   01/21/17 1030 01/21/17 1045  BP: 131/78 132/74  Pulse: 71 70  Resp: 20 (!) 22  Temp:      Last Pain:  Vitals:   01/21/17 1018  TempSrc:   PainSc: [place]

## 2017-01-21 NOTE — Anesthesia Procedure Notes (Signed)
Procedure Name: LMA Insertion Date/Time: 01/21/2017 8:45 AM Performed by: Melynda Ripple D Pre-anesthesia Checklist: Patient identified, Emergency Drugs available, Suction available and Patient being monitored Patient Re-evaluated:Patient Re-evaluated prior to inductionOxygen Delivery Method: Circle system utilized Preoxygenation: Pre-oxygenation with 100% oxygen Intubation Type: IV induction Ventilation: Mask ventilation without difficulty LMA: LMA inserted LMA Size: 4.0 Number of attempts: 1 Airway Equipment and Method: Bite block Placement Confirmation: positive ETCO2 Tube secured with: Tape Dental Injury: Teeth and Oropharynx as per pre-operative assessment

## 2017-01-21 NOTE — Anesthesia Preprocedure Evaluation (Signed)
Anesthesia Evaluation  Patient identified by MRN, date of birth, ID band Patient awake    Reviewed: Allergy & Precautions, H&P , NPO status , Patient's Chart, lab work & pertinent test results  History of Anesthesia Complications (+) PONV and history of anesthetic complications  Airway Mallampati: II   Neck ROM: full    Dental   Pulmonary former smoker,    breath sounds clear to auscultation       Cardiovascular negative cardio ROS   Rhythm:regular Rate:Normal     Neuro/Psych PSYCHIATRIC DISORDERS Anxiety Depression    GI/Hepatic   Endo/Other    Renal/GU      Musculoskeletal  (+) Arthritis ,   Abdominal   Peds  Hematology   Anesthesia Other Findings   Reproductive/Obstetrics H/o breast CA                             Anesthesia Physical Anesthesia Plan  ASA: II  Anesthesia Plan: General   Post-op Pain Management:    Induction: Intravenous  Airway Management Planned: LMA  Additional Equipment:   Intra-op Plan:   Post-operative Plan:   Informed Consent: I have reviewed the patients History and Physical, chart, labs and discussed the procedure including the risks, benefits and alternatives for the proposed anesthesia with the patient or authorized representative who has indicated his/her understanding and acceptance.     Plan Discussed with: CRNA, Anesthesiologist and Surgeon  Anesthesia Plan Comments:         Anesthesia Quick Evaluation

## 2017-01-21 NOTE — Transfer of Care (Signed)
Immediate Anesthesia Transfer of Care Note  Patient: Christina [name]  Procedure(s) Performed: Procedure(s): BILATERAL BREAST  LIPOFILLING FOR ASYMMETRY (Bilateral)  Patient Location: PACU  Anesthesia Type:General  Level of Consciousness: awake, alert  and drowsy  Airway & Oxygen Therapy: Patient Spontanous Breathing and Patient connected to face mask oxygen  Post-op Assessment: Report given to RN and Post -op Vital signs reviewed and stable  Post vital signs: Reviewed and stable  Last Vitals:  Vitals:   01/21/17 0732  BP: 106/66  Pulse: 66  Resp: 20  Temp: 37 C    Last Pain:  Vitals:   01/21/17 0732  TempSrc: Oral         Complications: No apparent anesthesia complications

## 2017-01-23 ENCOUNTER — Encounter (HOSPITAL_BASED_OUTPATIENT_CLINIC_OR_DEPARTMENT_OTHER): Payer: Self-pay | Admitting: Plastic Surgery

## 2017-02-07 NOTE — Addendum Note (Signed)
Addendum  created 02/07/17 1015 by Duane Boston, MD   Sign clinical note

## 2017-02-11 DIAGNOSIS — K5904 Chronic idiopathic constipation: Secondary | ICD-10-CM | POA: Diagnosis not present

## 2017-02-11 DIAGNOSIS — Z1211 Encounter for screening for malignant neoplasm of colon: Secondary | ICD-10-CM | POA: Diagnosis not present

## 2017-02-11 DIAGNOSIS — Z800 Family history of malignant neoplasm of digestive organs: Secondary | ICD-10-CM | POA: Diagnosis not present

## 2017-03-02 DIAGNOSIS — M25521 Pain in right elbow: Secondary | ICD-10-CM | POA: Diagnosis not present

## 2017-03-14 ENCOUNTER — Other Ambulatory Visit: Payer: Self-pay | Admitting: Hematology and Oncology

## 2017-03-14 DIAGNOSIS — C50112 Malignant neoplasm of central portion of left female breast: Secondary | ICD-10-CM

## 2017-04-22 DIAGNOSIS — F330 Major depressive disorder, recurrent, mild: Secondary | ICD-10-CM | POA: Diagnosis not present

## 2017-04-22 DIAGNOSIS — L918 Other hypertrophic disorders of the skin: Secondary | ICD-10-CM | POA: Diagnosis not present

## 2017-04-29 NOTE — Addendum Note (Signed)
Addendum  created 04/29/17 1419 by Albertha Ghee, MD   Sign clinical note

## 2017-05-01 DIAGNOSIS — K130 Diseases of lips: Secondary | ICD-10-CM | POA: Diagnosis not present

## 2017-05-01 DIAGNOSIS — B351 Tinea unguium: Secondary | ICD-10-CM | POA: Diagnosis not present

## 2017-05-01 DIAGNOSIS — Z23 Encounter for immunization: Secondary | ICD-10-CM | POA: Diagnosis not present

## 2017-05-01 DIAGNOSIS — Z853 Personal history of malignant neoplasm of breast: Secondary | ICD-10-CM | POA: Diagnosis not present

## 2017-05-12 DIAGNOSIS — C50112 Malignant neoplasm of central portion of left female breast: Secondary | ICD-10-CM | POA: Diagnosis not present

## 2017-05-12 DIAGNOSIS — Z9889 Other specified postprocedural states: Secondary | ICD-10-CM | POA: Diagnosis not present

## 2017-05-12 DIAGNOSIS — Z170 Estrogen receptor positive status [ER+]: Secondary | ICD-10-CM | POA: Diagnosis not present

## 2017-05-15 DIAGNOSIS — C50112 Malignant neoplasm of central portion of left female breast: Secondary | ICD-10-CM | POA: Diagnosis not present

## 2017-05-15 DIAGNOSIS — Z170 Estrogen receptor positive status [ER+]: Secondary | ICD-10-CM | POA: Diagnosis not present

## 2017-05-15 DIAGNOSIS — Z9889 Other specified postprocedural states: Secondary | ICD-10-CM | POA: Diagnosis not present

## 2017-06-04 DIAGNOSIS — M25521 Pain in right elbow: Secondary | ICD-10-CM | POA: Diagnosis not present

## 2017-06-04 DIAGNOSIS — M7711 Lateral epicondylitis, right elbow: Secondary | ICD-10-CM | POA: Diagnosis not present

## 2017-07-15 DIAGNOSIS — Z853 Personal history of malignant neoplasm of breast: Secondary | ICD-10-CM | POA: Diagnosis not present

## 2017-07-15 DIAGNOSIS — Z9889 Other specified postprocedural states: Secondary | ICD-10-CM | POA: Diagnosis not present

## 2017-08-05 ENCOUNTER — Other Ambulatory Visit: Payer: Self-pay

## 2017-08-05 ENCOUNTER — Encounter (HOSPITAL_BASED_OUTPATIENT_CLINIC_OR_DEPARTMENT_OTHER): Payer: Self-pay | Admitting: *Deleted

## 2017-08-07 ENCOUNTER — Ambulatory Visit: Payer: Self-pay | Admitting: Plastic Surgery

## 2017-08-07 DIAGNOSIS — Z9013 Acquired absence of bilateral breasts and nipples: Secondary | ICD-10-CM

## 2017-08-12 ENCOUNTER — Encounter (HOSPITAL_BASED_OUTPATIENT_CLINIC_OR_DEPARTMENT_OTHER): Payer: Self-pay | Admitting: *Deleted

## 2017-08-12 ENCOUNTER — Encounter (HOSPITAL_BASED_OUTPATIENT_CLINIC_OR_DEPARTMENT_OTHER)
Admission: RE | Disposition: A | Discharge status: Home / Self Care | Payer: Self-pay | Source: Ambulatory Visit | Attending: Plastic Surgery

## 2017-08-12 ENCOUNTER — Other Ambulatory Visit: Payer: Self-pay

## 2017-08-12 ENCOUNTER — Ambulatory Visit (HOSPITAL_BASED_OUTPATIENT_CLINIC_OR_DEPARTMENT_OTHER)
Admission: RE | Admit: 2017-08-12 | Discharge: 2017-08-12 | Disposition: A | Discharge status: Home / Self Care | Payer: BLUE CROSS/BLUE SHIELD | Source: Ambulatory Visit | Attending: Plastic Surgery | Admitting: Plastic Surgery

## 2017-08-12 ENCOUNTER — Ambulatory Visit (HOSPITAL_BASED_OUTPATIENT_CLINIC_OR_DEPARTMENT_OTHER): Payer: BLUE CROSS/BLUE SHIELD | Admitting: Anesthesiology

## 2017-08-12 DIAGNOSIS — F419 Anxiety disorder, unspecified: Secondary | ICD-10-CM | POA: Diagnosis not present

## 2017-08-12 DIAGNOSIS — N6489 Other specified disorders of breast: Secondary | ICD-10-CM | POA: Diagnosis not present

## 2017-08-12 DIAGNOSIS — Z9013 Acquired absence of bilateral breasts and nipples: Secondary | ICD-10-CM | POA: Diagnosis not present

## 2017-08-12 DIAGNOSIS — Z79899 Other long term (current) drug therapy: Secondary | ICD-10-CM | POA: Insufficient documentation

## 2017-08-12 DIAGNOSIS — Z87891 Personal history of nicotine dependence: Secondary | ICD-10-CM | POA: Diagnosis not present

## 2017-08-12 DIAGNOSIS — N651 Disproportion of reconstructed breast: Secondary | ICD-10-CM | POA: Diagnosis not present

## 2017-08-12 DIAGNOSIS — F329 Major depressive disorder, single episode, unspecified: Secondary | ICD-10-CM | POA: Diagnosis not present

## 2017-08-12 DIAGNOSIS — Z853 Personal history of malignant neoplasm of breast: Secondary | ICD-10-CM | POA: Insufficient documentation

## 2017-08-12 DIAGNOSIS — Z800 Family history of malignant neoplasm of digestive organs: Secondary | ICD-10-CM | POA: Diagnosis not present

## 2017-08-12 HISTORY — PX: BREAST IMPLANT REMOVAL: SHX5361

## 2017-08-12 HISTORY — PX: TISSUE EXPANDER PLACEMENT: SHX2530

## 2017-08-12 SURGERY — REMOVAL, IMPLANT, BREAST
Anesthesia: General | Site: Breast | Laterality: Bilateral

## 2017-08-12 MED ORDER — LIDOCAINE 2% (20 MG/ML) 5 ML SYRINGE
INTRAMUSCULAR | Status: AC
  Filled 2017-08-12: qty 5

## 2017-08-12 MED ORDER — CEFAZOLIN SODIUM-DEXTROSE 2-4 GM/100ML-% IV SOLN
INTRAVENOUS | Status: AC
  Filled 2017-08-12: qty 100

## 2017-08-12 MED ORDER — LACTATED RINGERS IV SOLN: INTRAVENOUS | Status: DC

## 2017-08-12 MED ORDER — SODIUM CHLORIDE 0.9 % IV SOLN
INTRAVENOUS | Status: DC | PRN
  Administered 2017-08-12: 500 mL

## 2017-08-12 MED ORDER — LIDOCAINE 2% (20 MG/ML) 5 ML SYRINGE
INTRAMUSCULAR | Status: DC | PRN
  Administered 2017-08-12: 60 mg via INTRAVENOUS

## 2017-08-12 MED ORDER — CEFAZOLIN SODIUM-DEXTROSE 2-4 GM/100ML-% IV SOLN
2.0000 g | INTRAVENOUS | Status: AC
  Administered 2017-08-12: 2 g via INTRAVENOUS

## 2017-08-12 MED ORDER — ONDANSETRON HCL 4 MG/2ML IJ SOLN
INTRAMUSCULAR | Status: DC | PRN
  Administered 2017-08-12: 4 mg via INTRAVENOUS

## 2017-08-12 MED ORDER — ACETAMINOPHEN 325 MG PO TABS
ORAL_TABLET | ORAL | Status: AC
  Filled 2017-08-12: qty 2

## 2017-08-12 MED ORDER — SCOPOLAMINE 1 MG/3DAYS TD PT72
1.0000 | MEDICATED_PATCH | Freq: Once | TRANSDERMAL | Status: DC | PRN
  Administered 2017-08-12: 1.5 mg via TRANSDERMAL

## 2017-08-12 MED ORDER — DEXAMETHASONE SODIUM PHOSPHATE 10 MG/ML IJ SOLN
INTRAMUSCULAR | Status: AC
  Filled 2017-08-12: qty 1

## 2017-08-12 MED ORDER — DEXAMETHASONE SODIUM PHOSPHATE 4 MG/ML IJ SOLN
INTRAMUSCULAR | Status: DC | PRN
  Administered 2017-08-12: 10 mg via INTRAVENOUS

## 2017-08-12 MED ORDER — ONDANSETRON HCL 4 MG/2ML IJ SOLN
INTRAMUSCULAR | Status: AC
  Filled 2017-08-12: qty 2

## 2017-08-12 MED ORDER — MIDAZOLAM HCL 2 MG/2ML IJ SOLN
1.0000 mg | INTRAMUSCULAR | Status: DC | PRN
  Administered 2017-08-12: 2 mg via INTRAVENOUS

## 2017-08-12 MED ORDER — MEPERIDINE HCL 25 MG/ML IJ SOLN: 6.2500 mg | INTRAMUSCULAR | Status: DC | PRN

## 2017-08-12 MED ORDER — LACTATED RINGERS IV SOLN
INTRAVENOUS | Status: DC
  Administered 2017-08-12 (×2): via INTRAVENOUS

## 2017-08-12 MED ORDER — SCOPOLAMINE 1 MG/3DAYS TD PT72
MEDICATED_PATCH | TRANSDERMAL | Status: AC
  Filled 2017-08-12: qty 1

## 2017-08-12 MED ORDER — ACETAMINOPHEN 325 MG PO TABS
650.0000 mg | ORAL_TABLET | ORAL | Status: DC | PRN
  Administered 2017-08-12: 650 mg via ORAL

## 2017-08-12 MED ORDER — FENTANYL CITRATE (PF) 100 MCG/2ML IJ SOLN: 25.0000 ug | INTRAMUSCULAR | Status: DC | PRN

## 2017-08-12 MED ORDER — PROPOFOL 10 MG/ML IV BOLUS
INTRAVENOUS | Status: AC
  Filled 2017-08-12: qty 20

## 2017-08-12 MED ORDER — ACETAMINOPHEN 650 MG RE SUPP: 650.0000 mg | RECTAL | Status: DC | PRN

## 2017-08-12 MED ORDER — FENTANYL CITRATE (PF) 100 MCG/2ML IJ SOLN
50.0000 ug | INTRAMUSCULAR | Status: AC | PRN
  Administered 2017-08-12: 25 ug via INTRAVENOUS
  Administered 2017-08-12: 100 ug via INTRAVENOUS
  Administered 2017-08-12: 50 ug via INTRAVENOUS

## 2017-08-12 MED ORDER — SODIUM CHLORIDE 0.9% FLUSH: 3.0000 mL | Freq: Two times a day (BID) | INTRAVENOUS | Status: DC

## 2017-08-12 MED ORDER — OXYCODONE HCL 5 MG PO TABS: 5.0000 mg | ORAL_TABLET | ORAL | Status: DC | PRN

## 2017-08-12 MED ORDER — SODIUM CHLORIDE 0.9 % IV SOLN: 250.0000 mL | INTRAVENOUS | Status: DC | PRN

## 2017-08-12 MED ORDER — PROPOFOL 10 MG/ML IV BOLUS
INTRAVENOUS | Status: DC | PRN
Start: 2017-08-12 — End: 2017-08-12
  Administered 2017-08-12: 200 mg via INTRAVENOUS

## 2017-08-12 MED ORDER — FENTANYL CITRATE (PF) 100 MCG/2ML IJ SOLN
INTRAMUSCULAR | Status: AC
  Filled 2017-08-12: qty 2

## 2017-08-12 MED ORDER — SODIUM CHLORIDE 0.9% FLUSH: 3.0000 mL | INTRAVENOUS | Status: DC | PRN

## 2017-08-12 MED ORDER — MIDAZOLAM HCL 2 MG/2ML IJ SOLN
INTRAMUSCULAR | Status: AC
  Filled 2017-08-12: qty 2

## 2017-08-12 MED ORDER — METOCLOPRAMIDE HCL 5 MG/ML IJ SOLN: 10.0000 mg | Freq: Once | INTRAMUSCULAR | Status: DC | PRN

## 2017-08-12 SURGICAL SUPPLY — 67 items
BAG DECANTER FOR FLEXI CONT (MISCELLANEOUS) ×2 IMPLANT
BINDER BREAST LRG (GAUZE/BANDAGES/DRESSINGS) ×2 IMPLANT
BINDER BREAST MEDIUM (GAUZE/BANDAGES/DRESSINGS) IMPLANT
BINDER BREAST XLRG (GAUZE/BANDAGES/DRESSINGS) IMPLANT
BINDER BREAST XXLRG (GAUZE/BANDAGES/DRESSINGS) IMPLANT
BIOPATCH RED 1 DISK 7.0 (GAUZE/BANDAGES/DRESSINGS) ×2 IMPLANT
BLADE HEX COATED 2.75 (ELECTRODE) ×2 IMPLANT
BLADE SURG 15 STRL LF DISP TIS (BLADE) ×1 IMPLANT
BLADE SURG 15 STRL SS (BLADE) ×1
BNDG GAUZE ELAST 4 BULKY (GAUZE/BANDAGES/DRESSINGS) ×4 IMPLANT
CANISTER SUCT 1200ML W/VALVE (MISCELLANEOUS) ×2 IMPLANT
CHLORAPREP W/TINT 26ML (MISCELLANEOUS) ×2 IMPLANT
COVER BACK TABLE 60X90IN (DRAPES) ×2 IMPLANT
COVER MAYO STAND STRL (DRAPES) ×2 IMPLANT
DECANTER SPIKE VIAL GLASS SM (MISCELLANEOUS) IMPLANT
DERMABOND ADVANCED (GAUZE/BANDAGES/DRESSINGS)
DERMABOND ADVANCED .7 DNX12 (GAUZE/BANDAGES/DRESSINGS) IMPLANT
DRAIN CHANNEL 19F RND (DRAIN) ×2 IMPLANT
DRAPE LAPAROSCOPIC ABDOMINAL (DRAPES) ×2 IMPLANT
DRSG PAD ABDOMINAL 8X10 ST (GAUZE/BANDAGES/DRESSINGS) ×4 IMPLANT
ELECT BLADE 4.0 EZ CLEAN MEGAD (MISCELLANEOUS) ×4
ELECT BLADE 6.5 .24CM SHAFT (ELECTRODE) IMPLANT
ELECT REM PT RETURN 9FT ADLT (ELECTROSURGICAL) ×2
ELECTRODE BLDE 4.0 EZ CLN MEGD (MISCELLANEOUS) ×2 IMPLANT
ELECTRODE REM PT RTRN 9FT ADLT (ELECTROSURGICAL) ×1 IMPLANT
EVACUATOR SILICONE 100CC (DRAIN) ×2 IMPLANT
GLOVE BIO SURGEON STRL SZ 6.5 (GLOVE) ×4 IMPLANT
GOWN STRL REUS W/ TWL LRG LVL3 (GOWN DISPOSABLE) ×3 IMPLANT
GOWN STRL REUS W/TWL LRG LVL3 (GOWN DISPOSABLE) ×3
IMPL EXPANDER BREAST 535CC (Breast) ×2 IMPLANT
IMPLANT BREAST 535CC (Breast) ×2 IMPLANT
IMPLANT EXPANDER BREAST 535CC (Breast) ×2 IMPLANT
IV NS 1000ML (IV SOLUTION) ×1
IV NS 1000ML BAXH (IV SOLUTION) ×1 IMPLANT
IV NS 500ML (IV SOLUTION)
IV NS 500ML BAXH (IV SOLUTION) IMPLANT
KIT FILL SYSTEM UNIVERSAL (SET/KITS/TRAYS/PACK) IMPLANT
NDL SAFETY ECLIPSE 18X1.5 (NEEDLE) IMPLANT
NEEDLE HYPO 18GX1.5 SHARP (NEEDLE)
NEEDLE HYPO 25X1 1.5 SAFETY (NEEDLE) IMPLANT
NS IRRIG 1000ML POUR BTL (IV SOLUTION) IMPLANT
PACK BASIN DAY SURGERY FS (CUSTOM PROCEDURE TRAY) ×2 IMPLANT
PENCIL BUTTON HOLSTER BLD 10FT (ELECTRODE) ×2 IMPLANT
PIN SAFETY STERILE (MISCELLANEOUS) ×2 IMPLANT
SLEEVE SCD COMPRESS KNEE MED (MISCELLANEOUS) ×2 IMPLANT
SPONGE LAP 18X18 X RAY DECT (DISPOSABLE) ×6 IMPLANT
SUT MNCRL AB 4-0 PS2 18 (SUTURE) ×4 IMPLANT
SUT MON AB 3-0 SH 27 (SUTURE) ×2
SUT MON AB 3-0 SH27 (SUTURE) ×2 IMPLANT
SUT MON AB 5-0 PS2 18 (SUTURE) ×2 IMPLANT
SUT PDS 3-0 CT2 (SUTURE)
SUT PDS AB 2-0 CT2 27 (SUTURE) ×4 IMPLANT
SUT PDS II 3-0 CT2 27 ABS (SUTURE) IMPLANT
SUT PROLENE 3 0 PS 2 (SUTURE) IMPLANT
SUT SILK 3 0 PS 1 (SUTURE) ×2 IMPLANT
SUT VIC AB 3-0 SH 27 (SUTURE)
SUT VIC AB 3-0 SH 27X BRD (SUTURE) IMPLANT
SUT VICRYL 4-0 PS2 18IN ABS (SUTURE) IMPLANT
SWAB COLLECTION DEVICE MRSA (MISCELLANEOUS) IMPLANT
SWAB CULTURE ESWAB REG 1ML (MISCELLANEOUS) IMPLANT
SYR 50ML LL SCALE MARK (SYRINGE) IMPLANT
SYR BULB IRRIGATION 50ML (SYRINGE) ×2 IMPLANT
SYR CONTROL 10ML LL (SYRINGE) IMPLANT
TOWEL OR 17X24 6PK STRL BLUE (TOWEL DISPOSABLE) ×4 IMPLANT
TUBE CONNECTING 20X1/4 (TUBING) ×2 IMPLANT
UNDERPAD 30X30 (UNDERPADS AND DIAPERS) ×4 IMPLANT
YANKAUER SUCT BULB TIP NO VENT (SUCTIONS) ×2 IMPLANT

## 2017-08-12 NOTE — Discharge Instructions (Signed)
May shower tomorrow May go into a sports bra No heavy lifting  TOOK TYLENOL @3 :30 pm  No more tylenol products till 4 hrs later, 7:30 pm or later    Post Anesthesia Home Care Instructions  Activity: Get plenty of rest for the remainder of the day. A responsible individual must stay with you for 24 hours following the procedure.  For the next 24 hours, DO NOT: -Drive a car -Paediatric nurse -Drink alcoholic beverages -Take any medication unless instructed by your physician -Make any legal decisions or sign important papers.  Meals: Start with liquid foods such as gelatin or soup. Progress to regular foods as tolerated. Avoid greasy, spicy, heavy foods. If nausea and/or vomiting occur, drink only clear liquids until the nausea and/or vomiting subsides. Call your physician if vomiting continues.  Special Instructions/Symptoms: Your throat may feel dry or sore from the anesthesia or the breathing tube placed in your throat during surgery. If this causes discomfort, gargle with warm salt water. The discomfort should disappear within 24 hours.  If you had a scopolamine patch placed behind your ear for the management of post- operative nausea and/or vomiting:  1. The medication in the patch is effective for 72 hours, after which it should be removed.  Wrap patch in a tissue and discard in the trash. Wash hands thoroughly with soap and water. 2. You may remove the patch earlier than 72 hours if you experience unpleasant side effects which may include dry mouth, dizziness or visual disturbances. 3. Avoid touching the patch. Wash your hands with soap and water after contact with the patch.   Call your surgeon if you experience:   1.  Fever over 101.0. 2.  Inability to urinate. 3.  Nausea and/or vomiting. 4.  Extreme swelling or bruising at the surgical site. 5.  Continued bleeding from the incision. 6.  Increased pain, redness or drainage from the incision. 7.  Problems related to your  pain medication. 8.  Any problems and/or concerns

## 2017-08-12 NOTE — Anesthesia Preprocedure Evaluation (Signed)
Anesthesia Evaluation  Patient identified by MRN, date of birth, ID band Patient awake    Reviewed: Allergy & Precautions, NPO status , Patient's Chart, lab work & pertinent test results  History of Anesthesia Complications (+) PONV  Airway Mallampati: II  TM Distance: >3 FB Neck ROM: Full    Dental no notable dental hx.    Pulmonary former smoker,    Pulmonary exam normal breath sounds clear to auscultation       Cardiovascular negative cardio ROS Normal cardiovascular exam Rhythm:Regular Rate:Normal     Neuro/Psych negative neurological ROS  negative psych ROS   GI/Hepatic negative GI ROS, Neg liver ROS,   Endo/Other  negative endocrine ROS  Renal/GU negative Renal ROS  negative genitourinary   Musculoskeletal negative musculoskeletal ROS (+)   Abdominal   Peds negative pediatric ROS (+)  Hematology negative hematology ROS (+)   Anesthesia Other Findings   Reproductive/Obstetrics negative OB ROS                             Anesthesia Physical Anesthesia Plan  ASA: I  Anesthesia Plan: General   Post-op Pain Management:    Induction: Intravenous  PONV Risk Score and Plan: 4 or greater and Ondansetron, Dexamethasone, Midazolam and Scopolamine patch - Pre-op  Airway Management Planned: LMA  Additional Equipment:   Intra-op Plan:   Post-operative Plan: Extubation in OR  Informed Consent: I have reviewed the patients History and Physical, chart, labs and discussed the procedure including the risks, benefits and alternatives for the proposed anesthesia with the patient or authorized representative who has indicated his/her understanding and acceptance.   Dental advisory given  Plan Discussed with: CRNA  Anesthesia Plan Comments:         Anesthesia Quick Evaluation

## 2017-08-12 NOTE — Transfer of Care (Signed)
Immediate Anesthesia Transfer of Care Note  Patient: Christina [name]  Procedure(s) Performed: REMOVAL BILATERAL BREAST IMPLANTS (Bilateral Breast) PLACEMENT OF BILATERAL TISSUE EXPANDER (Bilateral Breast)  Patient Location: PACU  Anesthesia Type:General  Level of Consciousness: sedated  Airway & Oxygen Therapy: Patient Spontanous Breathing and Patient connected to face mask oxygen  Post-op Assessment: Report given to RN and Post -op Vital signs reviewed and stable  Post vital signs: Reviewed and stable  Last Vitals:  Vitals:   08/12/17 1104  BP: 118/76  Pulse: 73  Resp: 16  Temp: 36.8 C  SpO2: 100%    Last Pain:  Vitals:   08/12/17 1104  TempSrc: Oral         Complications: No apparent anesthesia complications

## 2017-08-12 NOTE — Anesthesia Procedure Notes (Signed)
Procedure Name: LMA Insertion Date/Time: 08/12/2017 1:17 PM Performed by: Maryella Shivers, CRNA Pre-anesthesia Checklist: Patient identified, Emergency Drugs available, Suction available and Patient being monitored Patient Re-evaluated:Patient Re-evaluated prior to induction Oxygen Delivery Method: Circle system utilized Preoxygenation: Pre-oxygenation with 100% oxygen Induction Type: IV induction Ventilation: Mask ventilation without difficulty LMA: LMA inserted LMA Size: 4.0 Number of attempts: 1 Airway Equipment and Method: Bite block Placement Confirmation: positive ETCO2 Tube secured with: Tape Dental Injury: Teeth and Oropharynx as per pre-operative assessment

## 2017-08-12 NOTE — Op Note (Signed)
Op report Bilateral Exchange   DATE OF OPERATION: 08/12/2017  LOCATION: [facility]  SURGICAL DIVISION: Plastic Surgery  PREOPERATIVE DIAGNOSES:  1.History of breast cancer.  2. Acquired absence of bilateral breast.  3. Breast asymmetry.  POSTOPERATIVE DIAGNOSES:  1. History of breast cancer.  2. Acquired absence of bilateral breast.  3. Breast asymmetry  PROCEDURE:  1. Bilateral removal of 937 cc silicone breast implants. 2. Bilateral capsulotomies for expander placement. 3. Bilateral breast expander placement.   SURGEON: Kelissa Merlin Sanger Masiyah Engen, DO  ANESTHESIA:  General.   COMPLICATIONS: None.   IMPLANTS: Left - [employer] Smooth [employer] ultra high Profile expander 535cc. Ref #[id number].  Serial Number [id number], 350 cc of saline placed. Right - [employer] Smooth [employer] ultra high Profile expander 535cc. Ref #[id number].  Serial Number [id number], 350 cc of saline placed.  INDICATIONS FOR PROCEDURE:  The patient, Christina [name], is a 51 y.o. female born on 02/03/1966, is here for treatment after bilateral breast reconstruction after mastectomies.  She had tissue expanders placed at the time of mastectomies followed by exchange.  She wanted to be larger and presents for exchange of her implants for expanders.  She requires capsulotomies to better position the larger expanders. MRN: [id number]  CONSENT:  Informed consent was obtained directly from the patient. Risks, benefits and alternatives were fully discussed. Specific risks including but not limited to bleeding, infection, hematoma, seroma, scarring, pain, implant infection, implant extrusion, capsular contracture, asymmetry, wound healing problems, and need for further surgery were all discussed. The patient did have an ample opportunity to have her questions answered to her satisfaction.   DESCRIPTION OF PROCEDURE:  The patient was taken to the operating room. SCDs were placed and IV  antibiotics were given. The patient's chest was prepped and draped in a sterile fashion. A time out was performed and the implants to be used were identified.    On the right breast: One percent Lidocaine with epinephrine was used to infiltrate at the incision site. The old mastectomy scar was incised.  The mastectomy flaps from the superior and inferior flaps were raised  for several centimeters to minimize tension for the closure. The ADM was split to expose and remove the implant.  Inspection of the pocket showed a normal healthy capsule and good integration of the biologic matrix.  The pocket was irrigated with antibiotic solution.  Medial capsulotomies were performed to allow for breast pocket expansion.  Measurements were made and a sizer used to confirm adequate pocket size.  Hemostasis was ensured with electrocautery. New gloves were placed. The expander was soaked in antibiotic solution and then placed in the pocket and oriented appropriately. The ADM and capsule on the anterior surface were re-closed with a 3-0 Monocryl suture. The remaining skin was closed with 4-0 Monocryl deep dermal and 5-0 Monocryl subcuticular stitches.   On the left breast: The old mastectomy scar was incised.  The mastectomy flaps from the superior and inferior flaps were raised over the ADM for several centimeters to minimize tension for the closure. The ADM was split to expose and remove the implant.  Inspection of the pocket showed a normal healthy capsule and good integration of the biologic matrix.   Medial capsulotomies were performed to allow for breast pocket expansion.  Measurements were made to confirm adequate pocket size for the expander dimensions.  Hemostasis was ensured with the electrocautery.  New gloves were applied. The expander was soaked in antibiotic solution and placed in the pocket and oriented  appropriately. Both expanders were secured by two of the tabs. The ADM and capsule on the anterior surface  were re-closed with a 3-0 Monocryl suture. The remaining skin was closed with 4-0 Monocryl deep dermal and 5-0 Monocryl subcuticular stitches.  Dermabond was applied to the incision site. A breast binder and ABDs were placed.  The patient was allowed to wake from anesthesia and taken to the recovery room in satisfactory condition.

## 2017-08-12 NOTE — H&P (Signed)
Christina [name] is an 51 y.o. female.   Chief Complaint: acquired absence of bilateral breasts HPI: The patient is a 51 yrs old wf here for reconstruction of her bilateral breasts.  She underwent expansion in the past and then implant placement as she was not wanting to go larger.  Now she has asymmetry and would like to be larger and more symmetric.  We have discussed the high risks of complication and she wishes to proceed.  Past Medical History:  Diagnosis Date  . Anxiety   . Arthritis    hands and knees  . 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 Depression   . Ovarian cyst    "they come and go" (01/09/2016)  . PONV (postoperative nausea and vomiting)   . Urgency of urination     Past Surgical History:  Procedure Laterality Date  . ABDOMINAL HYSTERECTOMY  05/1996   endometriosis  . ACHILLES TENDON REPAIR Right 2010  . APPENDECTOMY  [id number]   Early mucocele of appendix, removal of small leiomyoma from pelvis, lysis of adhesions  . BREAST BIOPSY Left 10/2015  . BREAST RECONSTRUCTION WITH PLACEMENT OF TISSUE EXPANDER AND FLEX HD (ACELLULAR HYDRATED DERMIS) Bilateral 01/09/2016  . BREAST RECONSTRUCTION WITH PLACEMENT OF TISSUE EXPANDER AND FLEX HD (ACELLULAR HYDRATED DERMIS) Bilateral 01/09/2016   Procedure: BREAST RECONSTRUCTION WITH PLACEMENT OF TISSUE EXPANDER AND FLEX HD (ACELLULAR HYDRATED DERMIS);  Surgeon: Loel Lofty Nigil Braman, DO;  Location: [place];  Service: Plastics;  Laterality: Bilateral;  . BREAST RECONSTRUCTION WITH PLACEMENT OF TISSUE EXPANDER AND FLEX HD (ACELLULAR HYDRATED DERMIS) Bilateral 05/29/2016   Procedure: PLACEMENT OF BILATERAL TISSUE EXPANDER AND FLEX HD (ACELLULAR HYDRATED DERMIS);  Surgeon: Wallace Going, DO;  Location: [place];  Service: Plastics;  Laterality: Bilateral;  . BREAST REDUCTION SURGERY Bilateral 11/26/2016   Procedure: BILATERAL BREAST CAPSULE CONTRACTURE RELASE;  Surgeon: Wallace Going, DO;  Location:  [place];  Service: Plastics;  Laterality: Bilateral;  . [place]; 1989; 1991  . INCISION AND DRAINAGE OF WOUND Bilateral 02/11/2016   Procedure: IRRIGATION AND DEBRIDEMENT OF BILATERAL BREAST POCKET;  Surgeon: Wallace Going, DO;  Location: [place];  Service: Plastics;  Laterality: Bilateral;  . LAPAROSCOPIC CHOLECYSTECTOMY  ~ 1999  . LIPOSUCTION WITH LIPOFILLING Bilateral 11/26/2016   Procedure: LIPOFILLING FOR SYMMETRY;  Surgeon: Wallace Going, DO;  Location: [place];  Service: Plastics;  Laterality: Bilateral;  . LIPOSUCTION WITH LIPOFILLING Bilateral 01/21/2017   Procedure: BILATERAL BREAST  LIPOFILLING FOR ASYMMETRY;  Surgeon: Wallace Going, DO;  Location: [place];  Service: Plastics;  Laterality: Bilateral;  . MASTECTOMY Bilateral 01/09/2016  . NIPPLE SPARING MASTECTOMY/SENTINAL LYMPH NODE BIOPSY/RECONSTRUCTION/PLACEMENT OF TISSUE EXPANDER Bilateral 01/09/2016   Procedure: BILATERAL NIPPLE SPARING MASTECTOMY WITH LEFT SENTINAL LYMPH NODE BIOPSY ;  Surgeon: Stark Klein, MD;  Location: [place];  Service: General;  Laterality: Bilateral;  . REMOVAL OF BILATERAL TISSUE EXPANDERS WITH PLACEMENT OF BILATERAL BREAST IMPLANTS Bilateral 08/20/2016   Procedure: REMOVAL OF BILATERAL TISSUE EXPANDERS WITH PLACEMENT OF BILATERAL SILICONE IMPLANTS;  Surgeon: Wallace Going, DO;  Location: [place];  Service: Plastics;  Laterality: Bilateral;  . REMOVAL OF TISSUE EXPANDER Bilateral 02/11/2016   Procedure: REMOVAL OF BILATERAL TISSUE EXPANDERS AND FLEX HD REMOVAL;  Surgeon: Wallace Going, DO;  Location: [place];  Service: Plastics;  Laterality: Bilateral;  . [place]  Family History  Problem Relation Age of Onset  . Heart failure Father   . Prostate cancer Father 52  . Colon polyps Mother        approx 2  . Other Mother        hx HPV and hysterectomy  due to precancerous cells  . Other Sister 2       hx of hysterectomy for unspecified reason; still has ovaries  . Other Sister 44       paternal half-sister hx of hysterectomy for unspecified reason; still has ovaries  . Bladder Cancer Maternal Uncle 79       not a smoker  . Kidney failure Maternal Grandmother   . Congestive Heart Failure Maternal Grandmother   . Colon cancer Maternal Grandmother 96  . Lung cancer Maternal Grandfather 82       smoker  . Breast cancer Paternal Grandmother        dx. early 66s; w/ hx of trauma to breast  . Crohn's disease Daughter   . Lung cancer Maternal Uncle 50       smoker   Social History:  reports that she quit smoking about 9 years ago. Her smoking use included cigarettes. She has a 10.00 pack-year smoking history. she has never used smokeless tobacco. She reports that she does not drink alcohol or use drugs.  Allergies: No Known Allergies  Medications Prior to Admission  Medication Sig Dispense Refill  . amitriptyline (ELAVIL) 100 MG tablet Take 100 mg by mouth at bedtime.    . tamoxifen (NOLVADEX) 20 MG tablet Take 1 tablet (20 mg total) by mouth daily. 90 tablet 3  . venlafaxine XR (EFFEXOR-XR) 75 MG 24 hr capsule TAKE ONE CAPSULE BY MOUTH ONCE DAILY WITH  BREAKFAST 30 capsule 6    No results found for this or any previous visit (from the past 32 hour(s)). No results found.  Review of Systems  Constitutional: Negative.   HENT: Negative.   Eyes: Negative.   Respiratory: Negative.   Cardiovascular: Negative.   Genitourinary: Negative.   Musculoskeletal: Negative.   Skin: Negative.   Neurological: Negative.   Psychiatric/Behavioral: Negative.     Blood pressure 118/76, pulse 73, temperature 98.3 F (36.8 C), temperature source Oral, resp. rate 16, height 5\' 7"  (1.702 m), weight 85 kg (187 lb 6.4 oz), SpO2 100 %. Physical Exam  Constitutional: She is oriented to person, place, and time. She appears well-developed and  well-nourished.  HENT:  Head: Normocephalic and atraumatic.  Eyes: EOM are normal.  Cardiovascular: Normal rate.  Respiratory: Effort normal.  GI: Soft. She exhibits no distension.  Neurological: She is alert and oriented to person, place, and time.  Psychiatric: She has a normal mood and affect. Her behavior is normal. Judgment and thought content normal.     Assessment/Plan Plan for removal of implants and placement of breast expanders.  [place], DO 08/12/2017, 12:53 PM

## 2017-08-13 ENCOUNTER — Encounter (HOSPITAL_BASED_OUTPATIENT_CLINIC_OR_DEPARTMENT_OTHER): Payer: Self-pay | Admitting: Plastic Surgery

## 2017-08-13 NOTE — Anesthesia Postprocedure Evaluation (Signed)
Anesthesia Post Note  Patient: Denzil Bristol Seiber  Procedure(s) Performed: REMOVAL BILATERAL BREAST IMPLANTS (Bilateral Breast) PLACEMENT OF BILATERAL TISSUE EXPANDER (Bilateral Breast)     Patient location during evaluation: PACU Anesthesia Type: General Level of consciousness: awake and alert Pain management: pain level controlled Vital Signs Assessment: post-procedure vital signs reviewed and stable Respiratory status: spontaneous breathing, nonlabored ventilation, respiratory function stable and patient connected to nasal cannula oxygen Cardiovascular status: blood pressure returned to baseline and stable Postop Assessment: no apparent nausea or vomiting Anesthetic complications: no    Last Vitals:  Vitals:   08/12/17 1535 08/12/17 1548  BP:  120/76  Pulse: 78 80  Resp: 17 18  Temp:  36.4 C  SpO2: 100% 100%    Last Pain:  Vitals:   08/12/17 1104  TempSrc: Oral                 Montez Hageman

## 2017-08-18 DIAGNOSIS — Z853 Personal history of malignant neoplasm of breast: Secondary | ICD-10-CM | POA: Diagnosis not present

## 2017-10-23 DIAGNOSIS — M25521 Pain in right elbow: Secondary | ICD-10-CM | POA: Diagnosis not present

## 2017-10-23 DIAGNOSIS — M7711 Lateral epicondylitis, right elbow: Secondary | ICD-10-CM | POA: Diagnosis not present

## 2017-10-23 DIAGNOSIS — M19021 Primary osteoarthritis, right elbow: Secondary | ICD-10-CM | POA: Diagnosis not present

## 2017-10-23 DIAGNOSIS — M25562 Pain in left knee: Secondary | ICD-10-CM | POA: Diagnosis not present

## 2017-10-23 DIAGNOSIS — M1712 Unilateral primary osteoarthritis, left knee: Secondary | ICD-10-CM | POA: Diagnosis not present

## 2017-10-26 DIAGNOSIS — B079 Viral wart, unspecified: Secondary | ICD-10-CM | POA: Diagnosis not present

## 2017-11-02 ENCOUNTER — Encounter (HOSPITAL_BASED_OUTPATIENT_CLINIC_OR_DEPARTMENT_OTHER): Payer: Self-pay | Admitting: *Deleted

## 2017-11-02 ENCOUNTER — Other Ambulatory Visit: Payer: Self-pay

## 2017-11-03 ENCOUNTER — Ambulatory Visit: Payer: Self-pay | Admitting: Plastic Surgery

## 2017-11-03 DIAGNOSIS — Z9013 Acquired absence of bilateral breasts and nipples: Secondary | ICD-10-CM

## 2017-11-05 ENCOUNTER — Ambulatory Visit (HOSPITAL_BASED_OUTPATIENT_CLINIC_OR_DEPARTMENT_OTHER): Payer: BLUE CROSS/BLUE SHIELD | Admitting: Anesthesiology

## 2017-11-05 ENCOUNTER — Other Ambulatory Visit: Payer: Self-pay

## 2017-11-05 ENCOUNTER — Encounter (HOSPITAL_BASED_OUTPATIENT_CLINIC_OR_DEPARTMENT_OTHER)
Admission: RE | Disposition: A | Discharge status: Home / Self Care | Payer: Self-pay | Source: Ambulatory Visit | Attending: Plastic Surgery

## 2017-11-05 ENCOUNTER — Encounter (HOSPITAL_BASED_OUTPATIENT_CLINIC_OR_DEPARTMENT_OTHER): Payer: Self-pay | Admitting: *Deleted

## 2017-11-05 ENCOUNTER — Ambulatory Visit (HOSPITAL_BASED_OUTPATIENT_CLINIC_OR_DEPARTMENT_OTHER)
Admission: RE | Admit: 2017-11-05 | Discharge: 2017-11-05 | Disposition: A | Discharge status: Home / Self Care | Payer: BLUE CROSS/BLUE SHIELD | Source: Ambulatory Visit | Attending: Plastic Surgery | Admitting: Plastic Surgery

## 2017-11-05 DIAGNOSIS — Z45811 Encounter for adjustment or removal of right breast implant: Secondary | ICD-10-CM | POA: Diagnosis not present

## 2017-11-05 DIAGNOSIS — F329 Major depressive disorder, single episode, unspecified: Secondary | ICD-10-CM | POA: Diagnosis not present

## 2017-11-05 DIAGNOSIS — Z6830 Body mass index (BMI) 30.0-30.9, adult: Secondary | ICD-10-CM | POA: Diagnosis not present

## 2017-11-05 DIAGNOSIS — Z79899 Other long term (current) drug therapy: Secondary | ICD-10-CM | POA: Diagnosis not present

## 2017-11-05 DIAGNOSIS — F419 Anxiety disorder, unspecified: Secondary | ICD-10-CM | POA: Insufficient documentation

## 2017-11-05 DIAGNOSIS — Z9013 Acquired absence of bilateral breasts and nipples: Secondary | ICD-10-CM | POA: Insufficient documentation

## 2017-11-05 DIAGNOSIS — Z853 Personal history of malignant neoplasm of breast: Secondary | ICD-10-CM | POA: Insufficient documentation

## 2017-11-05 DIAGNOSIS — L905 Scar conditions and fibrosis of skin: Secondary | ICD-10-CM | POA: Diagnosis not present

## 2017-11-05 DIAGNOSIS — Z87891 Personal history of nicotine dependence: Secondary | ICD-10-CM | POA: Diagnosis not present

## 2017-11-05 DIAGNOSIS — Z45812 Encounter for adjustment or removal of left breast implant: Secondary | ICD-10-CM | POA: Diagnosis not present

## 2017-11-05 DIAGNOSIS — E669 Obesity, unspecified: Secondary | ICD-10-CM | POA: Insufficient documentation

## 2017-11-05 HISTORY — PX: REMOVAL OF BILATERAL TISSUE EXPANDERS WITH PLACEMENT OF BILATERAL BREAST IMPLANTS: SHX6431

## 2017-11-05 SURGERY — REMOVAL, TISSUE EXPANDER, BREAST, BILATERAL, WITH BILATERAL IMPLANT IMPLANT INSERTION
Anesthesia: General | Site: Breast | Laterality: Bilateral

## 2017-11-05 MED ORDER — SUCCINYLCHOLINE CHLORIDE 200 MG/10ML IV SOSY
PREFILLED_SYRINGE | INTRAVENOUS | Status: AC
  Filled 2017-11-05: qty 10

## 2017-11-05 MED ORDER — ROCURONIUM BROMIDE 10 MG/ML (PF) SYRINGE
PREFILLED_SYRINGE | INTRAVENOUS | Status: AC
  Filled 2017-11-05: qty 5

## 2017-11-05 MED ORDER — SODIUM CHLORIDE 0.9% FLUSH: 3.0000 mL | Freq: Two times a day (BID) | INTRAVENOUS | Status: DC

## 2017-11-05 MED ORDER — PROPOFOL 10 MG/ML IV BOLUS
INTRAVENOUS | Status: DC | PRN
  Administered 2017-11-05: 50 mg via INTRAVENOUS
  Administered 2017-11-05: 150 mg via INTRAVENOUS

## 2017-11-05 MED ORDER — MIDAZOLAM HCL 2 MG/2ML IJ SOLN
INTRAMUSCULAR | Status: AC
  Filled 2017-11-05: qty 2

## 2017-11-05 MED ORDER — SCOPOLAMINE 1 MG/3DAYS TD PT72
1.0000 | MEDICATED_PATCH | Freq: Once | TRANSDERMAL | Status: DC | PRN
Start: 2017-11-05 — End: 2017-11-05
  Administered 2017-11-05: 1.5 mg via TRANSDERMAL

## 2017-11-05 MED ORDER — ONDANSETRON HCL 4 MG/2ML IJ SOLN
INTRAMUSCULAR | Status: AC
  Filled 2017-11-05: qty 2

## 2017-11-05 MED ORDER — SODIUM CHLORIDE 0.9 % IV SOLN
INTRAVENOUS | Status: DC | PRN
  Administered 2017-11-05: 500 mL

## 2017-11-05 MED ORDER — HYDROCODONE-ACETAMINOPHEN 7.5-325 MG PO TABS: 1.0000 | ORAL_TABLET | Freq: Once | ORAL | Status: DC | PRN

## 2017-11-05 MED ORDER — BUPIVACAINE-EPINEPHRINE 0.5% -1:200000 IJ SOLN
INTRAMUSCULAR | Status: DC | PRN
  Administered 2017-11-05: 10 mL

## 2017-11-05 MED ORDER — SODIUM CHLORIDE 0.9 % IV SOLN: 250.0000 mL | INTRAVENOUS | Status: DC | PRN

## 2017-11-05 MED ORDER — PHENYLEPHRINE 40 MCG/ML (10ML) SYRINGE FOR IV PUSH (FOR BLOOD PRESSURE SUPPORT)
PREFILLED_SYRINGE | INTRAVENOUS | Status: AC
  Filled 2017-11-05: qty 10

## 2017-11-05 MED ORDER — SODIUM CHLORIDE 0.9% FLUSH
3.0000 mL | INTRAVENOUS | Status: DC | PRN
Start: 2017-11-05 — End: 2017-11-05

## 2017-11-05 MED ORDER — PROPOFOL 500 MG/50ML IV EMUL
INTRAVENOUS | Status: AC
  Filled 2017-11-05: qty 50

## 2017-11-05 MED ORDER — LIDOCAINE 2% (20 MG/ML) 5 ML SYRINGE
INTRAMUSCULAR | Status: AC
  Filled 2017-11-05: qty 5

## 2017-11-05 MED ORDER — HYDROMORPHONE HCL 1 MG/ML IJ SOLN: 0.2500 mg | INTRAMUSCULAR | Status: DC | PRN

## 2017-11-05 MED ORDER — DEXAMETHASONE SODIUM PHOSPHATE 4 MG/ML IJ SOLN
INTRAMUSCULAR | Status: DC | PRN
  Administered 2017-11-05: 10 mg via INTRAVENOUS

## 2017-11-05 MED ORDER — OXYCODONE HCL 5 MG PO TABS
ORAL_TABLET | ORAL | Status: AC
  Filled 2017-11-05: qty 1

## 2017-11-05 MED ORDER — MEPERIDINE HCL 25 MG/ML IJ SOLN: 6.2500 mg | INTRAMUSCULAR | Status: DC | PRN

## 2017-11-05 MED ORDER — ONDANSETRON HCL 4 MG/2ML IJ SOLN
INTRAMUSCULAR | Status: DC | PRN
  Administered 2017-11-05: 4 mg via INTRAVENOUS

## 2017-11-05 MED ORDER — SUFENTANIL CITRATE 50 MCG/ML IV SOLN
INTRAVENOUS | Status: AC
  Filled 2017-11-05: qty 1

## 2017-11-05 MED ORDER — LACTATED RINGERS IV SOLN
INTRAVENOUS | Status: DC
  Administered 2017-11-05 (×3): via INTRAVENOUS

## 2017-11-05 MED ORDER — MIDAZOLAM HCL 2 MG/2ML IJ SOLN
1.0000 mg | INTRAMUSCULAR | Status: DC | PRN
  Administered 2017-11-05: 2 mg via INTRAVENOUS

## 2017-11-05 MED ORDER — LIDOCAINE HCL (CARDIAC) 20 MG/ML IV SOLN
INTRAVENOUS | Status: DC | PRN
  Administered 2017-11-05: 50 mg via INTRAVENOUS

## 2017-11-05 MED ORDER — OXYCODONE HCL 5 MG PO TABS
5.0000 mg | ORAL_TABLET | ORAL | Status: DC | PRN
  Administered 2017-11-05: 5 mg via ORAL

## 2017-11-05 MED ORDER — CEFAZOLIN SODIUM-DEXTROSE 2-4 GM/100ML-% IV SOLN
INTRAVENOUS | Status: AC
  Filled 2017-11-05: qty 100

## 2017-11-05 MED ORDER — CEFAZOLIN SODIUM-DEXTROSE 2-4 GM/100ML-% IV SOLN
2.0000 g | INTRAVENOUS | Status: AC
  Administered 2017-11-05: 2 g via INTRAVENOUS

## 2017-11-05 MED ORDER — SUFENTANIL CITRATE 50 MCG/ML IV SOLN
INTRAVENOUS | Status: DC | PRN
  Administered 2017-11-05: 10 ug via INTRAVENOUS

## 2017-11-05 MED ORDER — SCOPOLAMINE 1 MG/3DAYS TD PT72: 1.0000 | MEDICATED_PATCH | TRANSDERMAL | Status: DC

## 2017-11-05 MED ORDER — DEXAMETHASONE SODIUM PHOSPHATE 10 MG/ML IJ SOLN
INTRAMUSCULAR | Status: AC
  Filled 2017-11-05: qty 1

## 2017-11-05 MED ORDER — EPHEDRINE 5 MG/ML INJ
INTRAVENOUS | Status: AC
  Filled 2017-11-05: qty 10

## 2017-11-05 MED ORDER — FENTANYL CITRATE (PF) 100 MCG/2ML IJ SOLN: 50.0000 ug | INTRAMUSCULAR | Status: DC | PRN

## 2017-11-05 MED ORDER — METOCLOPRAMIDE HCL 5 MG/ML IJ SOLN: 10.0000 mg | Freq: Once | INTRAMUSCULAR | Status: DC | PRN

## 2017-11-05 SURGICAL SUPPLY — 64 items
BAG DECANTER FOR FLEXI CONT (MISCELLANEOUS) ×2 IMPLANT
BINDER BREAST LRG (GAUZE/BANDAGES/DRESSINGS) IMPLANT
BINDER BREAST MEDIUM (GAUZE/BANDAGES/DRESSINGS) IMPLANT
BINDER BREAST XLRG (GAUZE/BANDAGES/DRESSINGS) ×2 IMPLANT
BINDER BREAST XXLRG (GAUZE/BANDAGES/DRESSINGS) IMPLANT
BIOPATCH RED 1 DISK 7.0 (GAUZE/BANDAGES/DRESSINGS) IMPLANT
BLADE HEX COATED 2.75 (ELECTRODE) ×2 IMPLANT
BLADE SURG 15 STRL LF DISP TIS (BLADE) ×2 IMPLANT
BLADE SURG 15 STRL SS (BLADE) ×2
BNDG GAUZE ELAST 4 BULKY (GAUZE/BANDAGES/DRESSINGS) IMPLANT
CANISTER SUCT 1200ML W/VALVE (MISCELLANEOUS) ×4 IMPLANT
CHLORAPREP W/TINT 26ML (MISCELLANEOUS) ×2 IMPLANT
CORD BIPOLAR FORCEPS 12FT (ELECTRODE) IMPLANT
COVER BACK TABLE 60X90IN (DRAPES) ×2 IMPLANT
COVER MAYO STAND STRL (DRAPES) ×2 IMPLANT
DECANTER SPIKE VIAL GLASS SM (MISCELLANEOUS) IMPLANT
DERMABOND ADVANCED (GAUZE/BANDAGES/DRESSINGS) ×2
DERMABOND ADVANCED .7 DNX12 (GAUZE/BANDAGES/DRESSINGS) ×2 IMPLANT
DRAIN CHANNEL 19F RND (DRAIN) IMPLANT
DRAPE LAPAROSCOPIC ABDOMINAL (DRAPES) ×2 IMPLANT
DRSG PAD ABDOMINAL 8X10 ST (GAUZE/BANDAGES/DRESSINGS) ×4 IMPLANT
ELECT BLADE 4.0 EZ CLEAN MEGAD (MISCELLANEOUS) ×2
ELECT REM PT RETURN 9FT ADLT (ELECTROSURGICAL) ×2
ELECTRODE BLDE 4.0 EZ CLN MEGD (MISCELLANEOUS) ×1 IMPLANT
ELECTRODE REM PT RTRN 9FT ADLT (ELECTROSURGICAL) ×1 IMPLANT
EVACUATOR SILICONE 100CC (DRAIN) IMPLANT
GAUZE SPONGE 4X4 12PLY STRL LF (GAUZE/BANDAGES/DRESSINGS) IMPLANT
GLOVE BIO SURGEON STRL SZ 6.5 (GLOVE) ×8 IMPLANT
GLOVE BIO SURGEON STRL SZ7 (GLOVE) ×2 IMPLANT
GOWN STRL REUS W/ TWL LRG LVL3 (GOWN DISPOSABLE) ×2 IMPLANT
GOWN STRL REUS W/ TWL XL LVL3 (GOWN DISPOSABLE) ×1 IMPLANT
GOWN STRL REUS W/TWL LRG LVL3 (GOWN DISPOSABLE) ×2
GOWN STRL REUS W/TWL XL LVL3 (GOWN DISPOSABLE) ×1
IMPL GEL HP 590CC (Breast) ×2 IMPLANT
IMPLANT GEL HP 590CC (Breast) ×4 IMPLANT
IV NS 1000ML (IV SOLUTION)
IV NS 1000ML BAXH (IV SOLUTION) IMPLANT
IV NS 500ML (IV SOLUTION)
IV NS 500ML BAXH (IV SOLUTION) IMPLANT
KIT FILL SYSTEM UNIVERSAL (SET/KITS/TRAYS/PACK) IMPLANT
NDL SAFETY ECLIPSE 18X1.5 (NEEDLE) ×1 IMPLANT
NEEDLE HYPO 18GX1.5 SHARP (NEEDLE) ×1
NEEDLE HYPO 25X1 1.5 SAFETY (NEEDLE) ×2 IMPLANT
PACK BASIN DAY SURGERY FS (CUSTOM PROCEDURE TRAY) ×2 IMPLANT
PENCIL BUTTON HOLSTER BLD 10FT (ELECTRODE) ×2 IMPLANT
PIN SAFETY STERILE (MISCELLANEOUS) IMPLANT
SIZER BREAST REUSE 590CC (SIZER) ×2
SIZER BRST REUSE 590CC (SIZER) ×1 IMPLANT
SLEEVE SCD COMPRESS KNEE MED (MISCELLANEOUS) ×2 IMPLANT
SPONGE LAP 18X18 RF (DISPOSABLE) ×4 IMPLANT
SUT MNCRL AB 4-0 PS2 18 (SUTURE) ×4 IMPLANT
SUT MON AB 3-0 SH 27 (SUTURE) ×2
SUT MON AB 3-0 SH27 (SUTURE) ×2 IMPLANT
SUT MON AB 5-0 PS2 18 (SUTURE) ×4 IMPLANT
SUT PDS AB 2-0 CT2 27 (SUTURE) IMPLANT
SUT VIC AB 3-0 SH 27 (SUTURE)
SUT VIC AB 3-0 SH 27X BRD (SUTURE) IMPLANT
SUT VICRYL 4-0 PS2 18IN ABS (SUTURE) IMPLANT
SYR BULB IRRIGATION 50ML (SYRINGE) ×2 IMPLANT
SYR CONTROL 10ML LL (SYRINGE) ×2 IMPLANT
TOWEL OR 17X24 6PK STRL BLUE (TOWEL DISPOSABLE) ×4 IMPLANT
TUBE CONNECTING 20X1/4 (TUBING) ×2 IMPLANT
UNDERPAD 30X30 (UNDERPADS AND DIAPERS) ×4 IMPLANT
YANKAUER SUCT BULB TIP NO VENT (SUCTIONS) ×2 IMPLANT

## 2017-11-05 NOTE — Anesthesia Postprocedure Evaluation (Signed)
Anesthesia Post Note  Patient: Oneka Parada Housewright  Procedure(s) Performed: REMOVAL OF BILATERAL TISSUE EXPANDERS WITH PLACEMENT OF BILATERAL BREAST SILICONE IMPLANTS (Bilateral Breast)     Patient location during evaluation: PACU Anesthesia Type: General Level of consciousness: awake and alert Pain management: pain level controlled Vital Signs Assessment: post-procedure vital signs reviewed and stable Respiratory status: spontaneous breathing, nonlabored ventilation and respiratory function stable Cardiovascular status: blood pressure returned to baseline and stable Postop Assessment: no apparent nausea or vomiting Anesthetic complications: no    Last Vitals:  Vitals:   11/05/17 1416 11/05/17 1430  BP: 132/73 122/73  Pulse: 81 73  Resp: (!) 21 (!) 21  Temp: 36.9 C   SpO2: 98% 100%    Last Pain:  Vitals:   11/05/17 0929  TempSrc: Oral                 Hillary Struss A.

## 2017-11-05 NOTE — Transfer of Care (Signed)
Immediate Anesthesia Transfer of Care Note  Patient: Kashay Cavenaugh [facility]  Procedure(s) Performed: REMOVAL OF BILATERAL TISSUE EXPANDERS WITH PLACEMENT OF BILATERAL BREAST SILICONE IMPLANTS (Bilateral Breast)  Patient Location: PACU  Anesthesia Type:General  Level of Consciousness: awake, alert  and drowsy  Airway & Oxygen Therapy: Patient Spontanous Breathing and Patient connected to face mask oxygen  Post-op Assessment: Report given to RN and Post -op Vital signs reviewed and stable  Post vital signs: Reviewed and stable  Last Vitals:  Vitals:   11/05/17 0929 11/05/17 1416  BP: 124/74 132/73  Pulse: 69 81  Resp: 18 (!) 21  Temp: 36.5 C   SpO2: 99% 98%    Last Pain:  Vitals:   11/05/17 0929  TempSrc: Oral         Complications: No apparent anesthesia complications

## 2017-11-05 NOTE — Discharge Instructions (Signed)
°  Post Anesthesia Home Care Instructions  Activity: Get plenty of rest for the remainder of the day. A responsible individual must stay with you for 24 hours following the procedure.  For the next 24 hours, DO NOT: -Drive a car -Paediatric nurse -Drink alcoholic beverages -Take any medication unless instructed by your physician -Make any legal decisions or sign important papers.  Meals: Start with liquid foods such as gelatin or soup. Progress to regular foods as tolerated. Avoid greasy, spicy, heavy foods. If nausea and/or vomiting occur, drink only clear liquids until the nausea and/or vomiting subsides. Call your physician if vomiting continues.  Special Instructions/Symptoms: Your throat may feel dry or sore from the anesthesia or the breathing tube placed in your throat during surgery. If this causes discomfort, gargle with warm salt water. The discomfort should disappear within 24 hours.  If you had a scopolamine patch placed behind your ear for the management of post- operative nausea and/or vomiting:  1. The medication in the patch is effective for 72 hours, after which it should be removed.  Wrap patch in a tissue and discard in the trash. Wash hands thoroughly with soap and water. 2. You may remove the patch earlier than 72 hours if you experience unpleasant side effects which may include dry mouth, dizziness or visual disturbances. 3. Avoid touching the patch. Wash your hands with soap and water after contact with the patch.      May shower tomorrow Continue binder or sports bra No heavy lifting

## 2017-11-05 NOTE — H&P (Signed)
Christina [name] is an 52 y.o. female.   Chief Complaint:  HPI: Christina [name] is a 52 yo female who is here for pre operative history and physical prior to exchange surgery with removal of bilateral tissue expanders and placement of bilateral silicone implants.  Expanders currently are 570/535 cc  History: Screening mammogram = calcifications of the LEFT breast in the subareolar location. The upper central location = positive for calcifications and fibrocystic disease. 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 She is 5 feet 7 inches tall, weighs =189 pounds, Preop = 36 C. Genetics positive. Quit smoking in 2009. She had mild ptosis of her breasts.  She underwent bilateral nipple sparing mastectomies with immediate reconstruction using expanders and FlexHD on 01/09/16. She had bilateral breast pockets infections and removal of the expanders and ADM on 02/11/16. Replacement of expanders and ADM on 05/29/16. Silicone implants placed 07/21/16. Lipo-filling of bilateral capsular contracture release and lipo-filling 11/26/16 for symmetry.  Revision with repeat bilateral breast lipo-filling 01/21/17. Desired to be larger and underwent replacement of bilateral tissue expanders/ADM on 08/12/17 and has tolerated expander fills well and is now ready for exchange surgery with placement of bilateral silicone implants.   Past Medical History:  Diagnosis Date  . Anxiety   . Arthritis    hands and knees  . 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 Depression   . Ovarian cyst    "they come and go" (01/09/2016)  . PONV (postoperative nausea and vomiting)    does well with scop patch  . Urgency of urination     Past Surgical History:  Procedure Laterality Date  . ABDOMINAL HYSTERECTOMY  05/1996   endometriosis  . ACHILLES TENDON REPAIR Right 2010  . APPENDECTOMY  [id number]   Early mucocele of appendix, removal of small leiomyoma from pelvis, lysis of  adhesions  . BREAST BIOPSY Left 10/2015  . BREAST IMPLANT REMOVAL Bilateral 08/12/2017   Procedure: REMOVAL BILATERAL BREAST IMPLANTS;  Surgeon: Wallace Going, DO;  Location: [place];  Service: Plastics;  Laterality: Bilateral;  . BREAST RECONSTRUCTION WITH PLACEMENT OF TISSUE EXPANDER AND FLEX HD (ACELLULAR HYDRATED DERMIS) Bilateral 01/09/2016  . BREAST RECONSTRUCTION WITH PLACEMENT OF TISSUE EXPANDER AND FLEX HD (ACELLULAR HYDRATED DERMIS) Bilateral 01/09/2016   Procedure: BREAST RECONSTRUCTION WITH PLACEMENT OF TISSUE EXPANDER AND FLEX HD (ACELLULAR HYDRATED DERMIS);  Surgeon: Loel Lofty Gray Maugeri, DO;  Location: [place];  Service: Plastics;  Laterality: Bilateral;  . BREAST RECONSTRUCTION WITH PLACEMENT OF TISSUE EXPANDER AND FLEX HD (ACELLULAR HYDRATED DERMIS) Bilateral 05/29/2016   Procedure: PLACEMENT OF BILATERAL TISSUE EXPANDER AND FLEX HD (ACELLULAR HYDRATED DERMIS);  Surgeon: Wallace Going, DO;  Location: [place];  Service: Plastics;  Laterality: Bilateral;  . BREAST REDUCTION SURGERY Bilateral 11/26/2016   Procedure: BILATERAL BREAST CAPSULE CONTRACTURE RELASE;  Surgeon: Wallace Going, DO;  Location: [place];  Service: Plastics;  Laterality: Bilateral;  . [place]; 1989; 1991  . INCISION AND DRAINAGE OF WOUND Bilateral 02/11/2016   Procedure: IRRIGATION AND DEBRIDEMENT OF BILATERAL BREAST POCKET;  Surgeon: Wallace Going, DO;  Location: [place];  Service: Plastics;  Laterality: Bilateral;  . LAPAROSCOPIC CHOLECYSTECTOMY  ~ 1999  . LIPOSUCTION WITH LIPOFILLING Bilateral 11/26/2016   Procedure: LIPOFILLING FOR SYMMETRY;  Surgeon: Wallace Going, DO;  Location: [place];  Service: Plastics;  Laterality: Bilateral;  . LIPOSUCTION WITH LIPOFILLING  Bilateral 01/21/2017   Procedure: BILATERAL BREAST  LIPOFILLING FOR ASYMMETRY;  Surgeon: Wallace Going, DO;  Location:  [place];  Service: Plastics;  Laterality: Bilateral;  . MASTECTOMY Bilateral 01/09/2016  . NIPPLE SPARING MASTECTOMY/SENTINAL LYMPH NODE BIOPSY/RECONSTRUCTION/PLACEMENT OF TISSUE EXPANDER Bilateral 01/09/2016   Procedure: BILATERAL NIPPLE SPARING MASTECTOMY WITH LEFT SENTINAL LYMPH NODE BIOPSY ;  Surgeon: Stark Klein, MD;  Location: [place];  Service: General;  Laterality: Bilateral;  . REMOVAL OF BILATERAL TISSUE EXPANDERS WITH PLACEMENT OF BILATERAL BREAST IMPLANTS Bilateral 08/20/2016   Procedure: REMOVAL OF BILATERAL TISSUE EXPANDERS WITH PLACEMENT OF BILATERAL SILICONE IMPLANTS;  Surgeon: Wallace Going, DO;  Location: [place];  Service: Plastics;  Laterality: Bilateral;  . REMOVAL OF TISSUE EXPANDER Bilateral 02/11/2016   Procedure: REMOVAL OF BILATERAL TISSUE EXPANDERS AND FLEX HD REMOVAL;  Surgeon: Wallace Going, DO;  Location: [place];  Service: Plastics;  Laterality: Bilateral;  . TISSUE EXPANDER PLACEMENT Bilateral 08/12/2017   Procedure: PLACEMENT OF BILATERAL TISSUE EXPANDER;  Surgeon: Wallace Going, DO;  Location: [place];  Service: Plastics;  Laterality: Bilateral;  . TUBAL LIGATION  1991    Family History  Problem Relation Age of Onset  . Heart failure Father   . Prostate cancer Father 85  . Colon polyps Mother        approx 2  . Other Mother        hx HPV and hysterectomy due to precancerous cells  . Other Sister 59       hx of hysterectomy for unspecified reason; still has ovaries  . Other Sister 63       paternal half-sister hx of hysterectomy for unspecified reason; still has ovaries  . Bladder Cancer Maternal Uncle 79       not a smoker  . Kidney failure Maternal Grandmother   . Congestive Heart Failure Maternal Grandmother   . Colon cancer Maternal Grandmother 31  . Lung cancer Maternal Grandfather 22       smoker  . Breast cancer Paternal Grandmother        dx. early 67s; w/ hx of  trauma to breast  . Crohn's disease Daughter   . Lung cancer Maternal Uncle 4       smoker   Social History:  reports that she quit smoking about 9 years ago. Her smoking use included cigarettes. She has a 10.00 pack-year smoking history. she has never used smokeless tobacco. She reports that she does not drink alcohol or use drugs.  Allergies: No Known Allergies  No medications prior to admission.    No results found for this or any previous visit (from the past 48 hour(s)). No results found.  Review of Systems  Constitutional: Negative.   HENT: Negative.   Eyes: Negative.   Respiratory: Negative.   Cardiovascular: Negative.   Gastrointestinal: Negative.   Genitourinary: Negative.   Musculoskeletal: Negative.   Skin: Negative.   Neurological: Negative.   Psychiatric/Behavioral: Negative.     Height 5\' 7"  (1.702 m), weight 84.4 kg (186 lb). Physical Exam  Constitutional: She is oriented to person, place, and time. She appears well-developed and well-nourished.  HENT:  Head: Normocephalic and atraumatic.  Eyes: Conjunctivae are normal. Pupils are equal, round, and reactive to light.  Cardiovascular: Normal rate.  Respiratory: Effort normal.  GI: Soft.  Neurological: She is alert and oriented to person, place, and time.  Skin: Skin is warm.  Psychiatric: She has a normal  mood and affect. Her behavior is normal. Judgment and thought content normal.     Assessment/Plan Plan for removal of bilateral breast expanders and placement of implants.  [place], DO 11/05/2017, 7:15 AM

## 2017-11-05 NOTE — Op Note (Signed)
Op report Bilateral Exchange   DATE OF OPERATION: 11/05/2017  LOCATION: [place]  SURGICAL DIVISION: Plastic Surgery  PREOPERATIVE DIAGNOSES:  1.History of breast cancer.  2. Acquired absence of bilateral breast.   POSTOPERATIVE DIAGNOSES:  1. History of breast cancer.  2. Acquired absence of bilateral breast.   PROCEDURE:  1. Bilateral exchange of tissue expanders for implants.  2. Bilateral capsulotomies for implant respositioning.  SURGEON: Claire Sanger Dillingham, DO  ASSISTANT: Shawn Rayburn, PA  ANESTHESIA:  General.   COMPLICATIONS: None.   IMPLANTS: Left - [employer]or Smooth Round Ultra High Profile Gel 590cc. Ref #[id number].  Serial Number [id number] Right - [employer]or Smooth Round Ultra High Profile Gel 590cc. Ref #[id number].  Serial Number [id number]  INDICATIONS FOR PROCEDURE:  The patient, Christina [name], is a 52 y.o. female born on 11-16-1965, is here for treatment after bilateral mastectomies.  She had tissue expanders placed at the time of mastectomies. She now presents for exchange of her expanders for implants.  She requires capsulotomies to better position the implants. MRN: [id number]  CONSENT:  Informed consent was obtained directly from the patient. Risks, benefits and alternatives were fully discussed. Specific risks including but not limited to bleeding, infection, hematoma, seroma, scarring, pain, implant infection, implant extrusion, capsular contracture, asymmetry, wound healing problems, and need for further surgery were all discussed. The patient did have an ample opportunity to have her questions answered to her satisfaction.   DESCRIPTION OF PROCEDURE:  The patient was taken to the operating room. SCDs were placed and IV antibiotics were given. The patient's chest was prepped and draped in a sterile fashion. A time out was performed and the implants to be used were identified.    On the right breast: One percent Lidocaine with  epinephrine was used to infiltrate at the incision site. The old mastectomy scar was excised.  The mastectomy flaps from the superior and inferior flaps were raised over the pectoralis major muscle for several centimeters to minimize tension for the closure. The pectoralis was split inferior to the skin incision to expose and remove the tissue expander.  Inspection of the pocket showed a normal healthy capsule and good integration of the biologic matrix.  The pocket was irrigated with antibiotic solution.  Circumferential capsulotomies were performed to allow for breast pocket expansion.  Measurements were made and a sizer used to confirm adequate pocket size for the implant dimensions.  Hemostasis was ensured with electrocautery. New gloves were placed. The implant was soaked in antibiotic solution and then placed in the pocket and oriented appropriately. The pectoralis major muscle and capsule on the anterior surface were re-closed with a 3-0 Monocryl suture. The remaining skin was closed with 4-0 Monocryl deep dermal and 5-0 Monocryl subcuticular stitches.   On the left breast: The old mastectomy scar was excised.  The mastectomy flaps from the superior and inferior flaps were raised over the pectoralis major muscle for several centimeters to minimize tension for the closure. The pectoralis was split inferior to the skin incision to expose and remove the tissue expander.  Inspection of the pocket showed a normal healthy capsule and good integration of the biologic matrix.   Circumferential capsulotomies were performed to allow for breast pocket expansion.  Measurements were made and a sizer utilized to confirm adequate pocket size for the implant dimensions.  Hemostasis was ensured with the electrocautery.  New gloves were applied. The implant was soaked in antibiotic solution and placed in the pocket and oriented appropriately.  The pectoralis major muscle and capsule on the anterior surface were re-closed  with a 3-0 Monocryl suture. The remaining skin was closed with 4-0 Monocryl deep dermal and 5-0 Monocryl subcuticular stitches.  Dermabond was applied to the incision site. A breast binder and ABDs were placed.  The patient was allowed to wake from anesthesia and taken to the recovery room in satisfactory condition.

## 2017-11-05 NOTE — Anesthesia Procedure Notes (Signed)
Procedure Name: LMA Insertion Date/Time: 11/05/2017 1:04 PM Performed by: Willa Frater, CRNA Pre-anesthesia Checklist: Patient identified, Emergency Drugs available, Suction available and Patient being monitored Patient Re-evaluated:Patient Re-evaluated prior to induction Oxygen Delivery Method: Circle system utilized Preoxygenation: Pre-oxygenation with 100% oxygen Induction Type: IV induction Ventilation: Mask ventilation without difficulty LMA: LMA inserted LMA Size: 4.0 Number of attempts: 1 Airway Equipment and Method: Bite block Placement Confirmation: positive ETCO2 Tube secured with: Tape Dental Injury: Teeth and Oropharynx as per pre-operative assessment

## 2017-11-05 NOTE — Anesthesia Preprocedure Evaluation (Signed)
Anesthesia Evaluation  Patient identified by MRN, date of birth, ID band Patient awake    Reviewed: Allergy & Precautions, NPO status , Patient's Chart, lab work & pertinent test results  History of Anesthesia Complications (+) PONV and history of anesthetic complications  Airway Mallampati: II  TM Distance: >3 FB Neck ROM: Full    Dental no notable dental hx. (+) Teeth Intact   Pulmonary former smoker,    Pulmonary exam normal breath sounds clear to auscultation       Cardiovascular negative cardio ROS Normal cardiovascular exam Rhythm:Regular Rate:Normal     Neuro/Psych PSYCHIATRIC DISORDERS Anxiety Depression negative neurological ROS     GI/Hepatic negative GI ROS, Neg liver ROS,   Endo/Other  Obesity Hx/o Breast Ca - S/P Bilateral Mastectomies  Renal/GU negative Renal ROS  negative genitourinary   Musculoskeletal  (+) Arthritis , Osteoarthritis,    Abdominal (+) + obese,   Peds  Hematology   Anesthesia Other Findings   Reproductive/Obstetrics Hx/o endometriosis                             Anesthesia Physical Anesthesia Plan  ASA: II  Anesthesia Plan: General   Post-op Pain Management:    Induction: Intravenous  PONV Risk Score and Plan: 4 or greater and Scopolamine patch - Pre-op, Midazolam, Dexamethasone, Ondansetron and Treatment may vary due to age or medical condition  Airway Management Planned: LMA  Additional Equipment:   Intra-op Plan:   Post-operative Plan: Extubation in OR  Informed Consent: I have reviewed the patients History and Physical, chart, labs and discussed the procedure including the risks, benefits and alternatives for the proposed anesthesia with the patient or authorized representative who has indicated his/her understanding and acceptance.   Dental advisory given  Plan Discussed with: CRNA and Surgeon  Anesthesia Plan Comments:          Anesthesia Quick Evaluation

## 2017-11-06 ENCOUNTER — Encounter (HOSPITAL_BASED_OUTPATIENT_CLINIC_OR_DEPARTMENT_OTHER): Payer: Self-pay | Admitting: Plastic Surgery

## 2017-11-19 ENCOUNTER — Telehealth: Payer: Self-pay

## 2017-11-19 NOTE — Telephone Encounter (Signed)
Pt left VM regarding she has a question about follow-ups.  Returned pt's call, no answer, left VM with our contact info.

## 2017-12-31 ENCOUNTER — Ambulatory Visit: Payer: BLUE CROSS/BLUE SHIELD | Admitting: Hematology and Oncology

## 2018-01-12 ENCOUNTER — Inpatient Hospital Stay: Payer: BLUE CROSS/BLUE SHIELD | Attending: Hematology and Oncology | Admitting: Hematology and Oncology

## 2018-01-12 ENCOUNTER — Telehealth: Payer: Self-pay | Admitting: Hematology and Oncology

## 2018-01-12 DIAGNOSIS — Z79810 Long term (current) use of selective estrogen receptor modulators (SERMs): Secondary | ICD-10-CM | POA: Diagnosis not present

## 2018-01-12 DIAGNOSIS — Z170 Estrogen receptor positive status [ER+]: Secondary | ICD-10-CM | POA: Diagnosis not present

## 2018-01-12 DIAGNOSIS — Z9013 Acquired absence of bilateral breasts and nipples: Secondary | ICD-10-CM | POA: Insufficient documentation

## 2018-01-12 DIAGNOSIS — Z90710 Acquired absence of both cervix and uterus: Secondary | ICD-10-CM | POA: Diagnosis not present

## 2018-01-12 DIAGNOSIS — Z79899 Other long term (current) drug therapy: Secondary | ICD-10-CM | POA: Diagnosis not present

## 2018-01-12 DIAGNOSIS — M1611 Unilateral primary osteoarthritis, right hip: Secondary | ICD-10-CM | POA: Insufficient documentation

## 2018-01-12 DIAGNOSIS — C50112 Malignant neoplasm of central portion of left female breast: Secondary | ICD-10-CM | POA: Insufficient documentation

## 2018-01-12 MED ORDER — TAMOXIFEN CITRATE 20 MG PO TABS: 20.0000 mg | ORAL_TABLET | Freq: Every day | ORAL | 3 refills | Status: DC

## 2018-01-12 NOTE — Telephone Encounter (Signed)
Gave avs and calendar ° °

## 2018-01-12 NOTE — Assessment & Plan Note (Signed)
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 Denies any myalgias (patient had a prior hysterectomy)  Surveillance: No role of mammograms since she had bil mastectomies Chest exam: 12/31/16: No palpable lumps in chest wall or axilla.  Return to clinic in 1 year for follow-up and breast exams and surveillance

## 2018-01-12 NOTE — Progress Notes (Signed)
Patient Care Team: Maurice Small, MD as PCP - General (Family Medicine) Megan Salon, MD as Consulting Physician (Gynecology) Sylvan Cheese, NP as Nurse Practitioner (Hematology and Oncology)  DIAGNOSIS:  Encounter Diagnosis  Name Primary?  . 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 p(m)T1b, pN0: Stage IA; RIGHT mastectomy: PASH       01/09/2016 Oncotype testing    Oncotype score: 5; 5% ROR      02/2016 -  Anti-estrogen oral therapy    Tamoxifen 20 mg daily.        CHIEF COMPLIANT: Follow-up on tamoxifen therapy  INTERVAL HISTORY: Christina [name] is a 53 year old with above-mentioned history of breast cancer treated with mastectomy followed by tamoxifen therapy.  She had an ATM gene mutation.  Denies any lumps or nodules in the breast.  Tolerating tamoxifen extremely well.  Does not have any hot flashes or myalgias.  She had a previous hysterectomy.  REVIEW OF SYSTEMS:   Constitutional: Denies fevers, chills  or abnormal weight loss Eyes: Denies blurriness of vision Ears, nose, mouth, throat, and face: Denies mucositis or sore throat Respiratory: Denies cough, dyspnea or wheezes Cardiovascular: Denies palpitation, chest discomfort Gastrointestinal:  Denies nausea, heartburn or change in bowel habits Skin: Denies abnormal skin rashes Lymphatics: Denies new lymphadenopathy or easy bruising Neurological:Denies numbness, tingling or new weaknesses Behavioral/Psych: Mood is stable, no new changes  Extremities: No lower extremity edema Breast:  denies any pain or lumps or nodules in either breasts All other systems were reviewed with the patient and are negative.  I have reviewed the past medical history, past surgical history, social history and family history with the patient and they are unchanged from previous note.  ALLERGIES:  has No Known Allergies.  MEDICATIONS:  Current Outpatient Medications  Medication Sig Dispense Refill  . amitriptyline (ELAVIL) 100 MG tablet Take 100 mg by mouth at bedtime.    Marland Kitchen buPROPion (WELLBUTRIN SR) 100 MG 12 hr tablet Take 100 mg by mouth 2 (two) times daily.    . diazepam (VALIUM) 2 MG tablet Take 2 mg by mouth every 12 (twelve) hours as needed for anxiety.    Marland Kitchen HYDROcodone-acetaminophen (NORCO/VICODIN) 5-325 MG tablet Take 1 tablet by mouth every 6 (six) hours as needed for moderate pain.    . tamoxifen (NOLVADEX) 20 MG tablet Take 1 tablet (20 mg total) by mouth daily. 90 tablet 3  . venlafaxine XR (EFFEXOR-XR) 75 MG 24 hr capsule TAKE ONE CAPSULE BY MOUTH ONCE DAILY WITH  BREAKFAST 30 capsule 6   No current facility-administered medications for this visit.     PHYSICAL EXAMINATION: ECOG PERFORMANCE STATUS: 1 - Symptomatic but completely ambulatory  Vitals:   01/12/18 1012  BP: 128/79  Pulse: 75  Resp: 18  Temp: 98.6 F (37 C)  SpO2: 100%   Filed Weights   01/12/18 1012  Weight: 205 lb (93 kg)    GENERAL:alert, no distress and  comfortable SKIN: skin color, texture, turgor are normal, no rashes or significant lesions EYES: normal, Conjunctiva are pink and non-injected, sclera clear OROPHARYNX:no exudate, no erythema and lips, buccal mucosa, and tongue normal  NECK: supple, thyroid normal size, non-tender, without nodularity LYMPH:  no palpable lymphadenopathy in the cervical, axillary or inguinal LUNGS: clear to auscultation and percussion with normal breathing effort HEART: regular rate & rhythm and no murmurs and no lower extremity edema ABDOMEN:abdomen soft, non-tender and normal bowel sounds MUSCULOSKELETAL:no cyanosis of digits and no clubbing  NEURO: alert & oriented x 3 with fluent speech, no focal motor/sensory deficits EXTREMITIES: No lower extremity edema BREAST: No palpable masses or nodules in either right or left breasts. No palpable axillary supraclavicular or infraclavicular adenopathy no breast tenderness or nipple discharge. (exam performed in the presence of a chaperone)  LABORATORY DATA:  I have reviewed the data as listed CMP Latest Ref Rng & Units 01/27/2016 01/11/2016 01/10/2016  Glucose 65 - 99 mg/dL 120(H) 116(H) 120(H)  BUN 6 - 20 mg/dL _0 Creatinine 0.44 - 1.00 mg/dL 0.86 0.76 0.69  Sodium 135 - 145 mmol/L 136 140 139  Potassium 3.5 - 5.1 mmol/L 3.9 4.1 4.2  Chloride 101 - 111 mmol/L 107 112(H) 110  CO2 22 - 32 mmol/L _1 Calcium 8.9 - 10.3 mg/dL 9.6 9.1 8.5(L)  Total Protein 6.5 - 8.1 g/dL 6.7 - -  Total Bilirubin 0.3 - 1.2 mg/dL 0.4 - -  Alkaline Phos 38 - 126 U/L 54 - -  AST 15 - 41 U/L 16 - -  ALT 14 - 54 U/L 18 - -    Lab Results  Component Value Date   WBC 14.2 (H) 01/27/2016   HGB 11.8 (L) 01/27/2016   HCT 37.0 01/27/2016   MCV 86.9 01/27/2016   PLT 376 01/27/2016   NEUTROABS 11.2 (H) 01/27/2016    ASSESSMENT & PLAN:  Malignant neoplasm of central portion of left breast in female, estrogen receptor positive (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  I discussed with her about exercise regularly.  Surveillance: No role of mammograms since she had bil mastectomies Chest exam: 12/31/16: No palpable lumps in chest wall or axilla.  Return to clinic in 1 year for follow-up and breast exams and surveillance    No orders of the defined types were placed in this encounter.  The patient has a good understanding of the overall plan. she agrees with it. she will call with any problems that may develop before the next visit here.   Harriette Ohara, MD 01/12/18

## 2018-02-15 DIAGNOSIS — M545 Low back pain: Secondary | ICD-10-CM | POA: Diagnosis not present

## 2018-02-15 DIAGNOSIS — M25521 Pain in right elbow: Secondary | ICD-10-CM | POA: Diagnosis not present

## 2018-02-15 DIAGNOSIS — M25551 Pain in right hip: Secondary | ICD-10-CM | POA: Diagnosis not present

## 2018-03-04 ENCOUNTER — Telehealth: Payer: Self-pay

## 2018-03-04 NOTE — Telephone Encounter (Signed)
Received voicemail from patient regarding 2 lumps at right axillary area.  Returned call to patient to f/u and obtain more information.  No answer, left voicemail to have patient call back.

## 2018-03-15 DIAGNOSIS — R2231 Localized swelling, mass and lump, right upper limb: Secondary | ICD-10-CM | POA: Diagnosis not present

## 2018-03-15 DIAGNOSIS — F330 Major depressive disorder, recurrent, mild: Secondary | ICD-10-CM | POA: Diagnosis not present

## 2018-03-15 DIAGNOSIS — F5101 Primary insomnia: Secondary | ICD-10-CM | POA: Diagnosis not present

## 2018-03-19 ENCOUNTER — Other Ambulatory Visit: Payer: Self-pay | Admitting: Family Medicine

## 2018-03-19 DIAGNOSIS — R2231 Localized swelling, mass and lump, right upper limb: Secondary | ICD-10-CM

## 2018-03-22 DIAGNOSIS — M545 Low back pain: Secondary | ICD-10-CM | POA: Diagnosis not present

## 2018-03-24 ENCOUNTER — Other Ambulatory Visit: Payer: Self-pay | Admitting: Family Medicine

## 2018-03-24 ENCOUNTER — Ambulatory Visit
Admission: RE | Admit: 2018-03-24 | Discharge: 2018-03-24 | Disposition: A | Discharge status: Home / Self Care | Payer: BLUE CROSS/BLUE SHIELD | Source: Ambulatory Visit | Attending: Family Medicine | Admitting: Family Medicine

## 2018-03-24 DIAGNOSIS — R928 Other abnormal and inconclusive findings on diagnostic imaging of breast: Secondary | ICD-10-CM | POA: Diagnosis not present

## 2018-03-24 DIAGNOSIS — R2231 Localized swelling, mass and lump, right upper limb: Secondary | ICD-10-CM

## 2018-03-24 DIAGNOSIS — Z853 Personal history of malignant neoplasm of breast: Secondary | ICD-10-CM | POA: Diagnosis not present

## 2018-03-24 DIAGNOSIS — N6489 Other specified disorders of breast: Secondary | ICD-10-CM | POA: Diagnosis not present

## 2018-04-14 DIAGNOSIS — M5416 Radiculopathy, lumbar region: Secondary | ICD-10-CM | POA: Diagnosis not present

## 2018-04-19 DIAGNOSIS — C50912 Malignant neoplasm of unspecified site of left female breast: Secondary | ICD-10-CM | POA: Diagnosis not present

## 2018-04-19 DIAGNOSIS — Z9011 Acquired absence of right breast and nipple: Secondary | ICD-10-CM | POA: Diagnosis not present

## 2018-04-26 DIAGNOSIS — D0512 Intraductal carcinoma in situ of left breast: Secondary | ICD-10-CM | POA: Diagnosis not present

## 2018-04-26 DIAGNOSIS — Z5181 Encounter for therapeutic drug level monitoring: Secondary | ICD-10-CM | POA: Diagnosis not present

## 2018-04-26 DIAGNOSIS — Z13220 Encounter for screening for lipoid disorders: Secondary | ICD-10-CM | POA: Diagnosis not present

## 2018-04-26 DIAGNOSIS — Z0000 Encounter for general adult medical examination without abnormal findings: Secondary | ICD-10-CM | POA: Diagnosis not present

## 2018-04-30 DIAGNOSIS — H04123 Dry eye syndrome of bilateral lacrimal glands: Secondary | ICD-10-CM | POA: Diagnosis not present

## 2018-04-30 DIAGNOSIS — H40033 Anatomical narrow angle, bilateral: Secondary | ICD-10-CM | POA: Diagnosis not present

## 2018-06-14 DIAGNOSIS — I83813 Varicose veins of bilateral lower extremities with pain: Secondary | ICD-10-CM | POA: Diagnosis not present

## 2018-06-22 DIAGNOSIS — C50912 Malignant neoplasm of unspecified site of left female breast: Secondary | ICD-10-CM | POA: Diagnosis not present

## 2018-06-22 DIAGNOSIS — Z9011 Acquired absence of right breast and nipple: Secondary | ICD-10-CM | POA: Diagnosis not present

## 2018-07-02 DIAGNOSIS — I83813 Varicose veins of bilateral lower extremities with pain: Secondary | ICD-10-CM | POA: Diagnosis not present

## 2018-07-05 DIAGNOSIS — M25521 Pain in right elbow: Secondary | ICD-10-CM | POA: Diagnosis not present

## 2018-07-05 DIAGNOSIS — M25522 Pain in left elbow: Secondary | ICD-10-CM | POA: Diagnosis not present

## 2018-07-05 DIAGNOSIS — M545 Low back pain: Secondary | ICD-10-CM | POA: Diagnosis not present

## 2018-07-16 DIAGNOSIS — Z23 Encounter for immunization: Secondary | ICD-10-CM | POA: Diagnosis not present

## 2018-08-03 DIAGNOSIS — C50112 Malignant neoplasm of central portion of left female breast: Secondary | ICD-10-CM | POA: Diagnosis not present

## 2018-08-03 DIAGNOSIS — Z170 Estrogen receptor positive status [ER+]: Secondary | ICD-10-CM | POA: Diagnosis not present

## 2018-08-09 DIAGNOSIS — Z421 Encounter for breast reconstruction following mastectomy: Secondary | ICD-10-CM | POA: Diagnosis not present

## 2018-08-09 DIAGNOSIS — N651 Disproportion of reconstructed breast: Secondary | ICD-10-CM | POA: Diagnosis not present

## 2018-08-09 DIAGNOSIS — Z853 Personal history of malignant neoplasm of breast: Secondary | ICD-10-CM | POA: Diagnosis not present

## 2018-08-09 DIAGNOSIS — Z9013 Acquired absence of bilateral breasts and nipples: Secondary | ICD-10-CM | POA: Diagnosis not present

## 2018-08-09 DIAGNOSIS — Z87891 Personal history of nicotine dependence: Secondary | ICD-10-CM | POA: Diagnosis not present

## 2018-08-16 DIAGNOSIS — B078 Other viral warts: Secondary | ICD-10-CM | POA: Diagnosis not present

## 2018-08-23 DIAGNOSIS — M1712 Unilateral primary osteoarthritis, left knee: Secondary | ICD-10-CM | POA: Diagnosis not present

## 2018-08-23 DIAGNOSIS — M25562 Pain in left knee: Secondary | ICD-10-CM | POA: Diagnosis not present

## 2018-08-26 DIAGNOSIS — M25562 Pain in left knee: Secondary | ICD-10-CM | POA: Diagnosis not present

## 2018-09-14 DIAGNOSIS — I83813 Varicose veins of bilateral lower extremities with pain: Secondary | ICD-10-CM | POA: Diagnosis not present

## 2018-09-14 DIAGNOSIS — I8311 Varicose veins of right lower extremity with inflammation: Secondary | ICD-10-CM | POA: Diagnosis not present

## 2018-09-14 DIAGNOSIS — I8312 Varicose veins of left lower extremity with inflammation: Secondary | ICD-10-CM | POA: Diagnosis not present

## 2018-10-12 DIAGNOSIS — R51 Headache: Secondary | ICD-10-CM | POA: Diagnosis not present

## 2018-10-14 ENCOUNTER — Other Ambulatory Visit: Payer: Self-pay | Admitting: Family Medicine

## 2018-10-14 DIAGNOSIS — R519 Headache, unspecified: Secondary | ICD-10-CM

## 2018-10-14 DIAGNOSIS — R51 Headache: Principal | ICD-10-CM

## 2018-10-20 ENCOUNTER — Ambulatory Visit
Admission: RE | Admit: 2018-10-20 | Discharge: 2018-10-20 | Disposition: A | Discharge status: Home / Self Care | Payer: BLUE CROSS/BLUE SHIELD | Source: Ambulatory Visit | Attending: Family Medicine | Admitting: Family Medicine

## 2018-10-20 DIAGNOSIS — R519 Headache, unspecified: Secondary | ICD-10-CM

## 2018-10-20 DIAGNOSIS — R51 Headache: Secondary | ICD-10-CM | POA: Diagnosis not present

## 2018-10-25 DIAGNOSIS — J0100 Acute maxillary sinusitis, unspecified: Secondary | ICD-10-CM | POA: Diagnosis not present

## 2018-11-09 DIAGNOSIS — Z713 Dietary counseling and surveillance: Secondary | ICD-10-CM | POA: Diagnosis not present

## 2018-11-09 DIAGNOSIS — Z6831 Body mass index (BMI) 31.0-31.9, adult: Secondary | ICD-10-CM | POA: Diagnosis not present

## 2018-11-23 DIAGNOSIS — M79672 Pain in left foot: Secondary | ICD-10-CM | POA: Diagnosis not present

## 2018-12-30 DIAGNOSIS — M25521 Pain in right elbow: Secondary | ICD-10-CM | POA: Diagnosis not present

## 2019-01-07 DIAGNOSIS — M25521 Pain in right elbow: Secondary | ICD-10-CM | POA: Diagnosis not present

## 2019-01-10 NOTE — Assessment & Plan Note (Signed)
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  I discussed with her about exercise regularly.  Surveillance: No role of mammograms since she had bil mastectomies Chest exam: 12/31/16: No palpable lumps in chest wall or axilla. Palpable lump right axilla: Mammogram and ultrasound 03/24/2018: Benign  Return to clinic in1 yearfor follow-up andbreast exams and surveillance

## 2019-01-11 ENCOUNTER — Telehealth: Payer: Self-pay | Admitting: Hematology and Oncology

## 2019-01-11 DIAGNOSIS — M25521 Pain in right elbow: Secondary | ICD-10-CM | POA: Diagnosis not present

## 2019-01-11 NOTE — Telephone Encounter (Signed)
Called patient regarding upcoming Webex appointment, patient would prefer this be a Doximetry visit.

## 2019-01-12 NOTE — Progress Notes (Signed)
HEMATOLOGY-ONCOLOGY video VISIT PROGRESS NOTE  I connected with Ziyonna W Arroyave on 01/13/2019 at  8:15 AM EDT by Doximity video conference and verified that I am speaking with the correct person using two identifiers.  I discussed the limitations, risks, security and privacy concerns of performing an evaluation and management service by Doximity and the availability of in person appointments.  I also discussed with the patient that there may be a patient responsible charge related to this service. The patient expressed understanding and agreed to proceed.  Patient's Location: Home Physician Location: Clinic  CHIEF COMPLIANT: Follow-up on tamoxifen therapy  INTERVAL HISTORY: Christina [name] is a 53 y.o. female with above-mentioned history of breast cancer treated with bilateral mastectomies followed by tamoxifen therapy. I last saw her a year ago. Mammogram and US on 03/25/18 for a mass in the right axilla showed no evidence of malignancy or enlarged lymph nodes. She presents today over Doximity for annual follow-up.  She does not report any pain or discomfort in the breast.  She has been tolerating tamoxifen extremely well.  Denies any hot flashes.  She has arthritis in the hip which was treated with a cortisone injection which has improved.    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 p(m)T1b, pN0: Stage IA; RIGHT mastectomy: PASH     01/09/2016 Oncotype testing    Oncotype score: 5; 5% ROR    02/2016 -  Anti-estrogen oral therapy    Tamoxifen 20 mg daily.      REVIEW OF SYSTEMS:   Constitutional: Denies fevers, chills or abnormal weight loss Eyes: Denies blurriness of vision Ears, nose, mouth, throat, and face: Denies mucositis or sore throat Respiratory: Denies cough, dyspnea or wheezes Cardiovascular: Denies palpitation, chest discomfort Gastrointestinal:  Denies nausea, heartburn or change in bowel habits Skin: Denies abnormal skin rashes Lymphatics: Denies new lymphadenopathy or easy bruising Neurological:Denies numbness, tingling or new weaknesses Behavioral/Psych: Mood is stable, no new changes  Extremities: No lower extremity edema Breast: denies any pain or lumps or nodules in either breasts All other systems were reviewed with the patient and are negative.  Observations/Objective:  There were no vitals filed for this visit. There is no height or weight on file to calculate BMI.  I have reviewed the data as listed CMP Latest Ref Rng & Units 01/27/2016 01/11/2016 01/10/2016  Glucose 65 - 99 mg/dL 120(H) 116(H) 120(H)  BUN 6 - 20 mg/dL 11 11 9  Creatinine 0.44 - 1.00 mg/dL 0.86 0.76 0.69  Sodium 135 - 145 mmol/L 136 140 139  Potassium 3.5 - 5.1 mmol/L 3.9 4.1 4.2  Chloride 101 - 111 mmol/L 107 112(H) 110  CO2 22 -   32 mmol/L _0 Calcium 8.9 - 10.3 mg/dL 9.6 9.1 8.5(L)  Total Protein 6.5 - 8.1 g/dL 6.7 - -  Total Bilirubin 0.3 - 1.2 mg/dL 0.4 - -  Alkaline Phos 38 - 126 U/L 54 - -  AST 15 - 41 U/L 16 - -  ALT 14 - 54 U/L 18 - -    Lab Results  Component Value Date   WBC 14.2 (H) 01/27/2016   HGB 11.8 (L) 01/27/2016   HCT 37.0  01/27/2016   MCV 86.9 01/27/2016   PLT 376 01/27/2016   NEUTROABS 11.2 (H) 01/27/2016      Assessment Plan:  Malignant neoplasm of central portion of left breast in female, estrogen receptor positive ([place]) Left mastectomy 01/09/2016: 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  Surveillance: No role of mammograms since she had bil mastectomies Chest exam: 12/31/16: No palpable lumps in chest wall or axilla. Palpable lump right axilla: Mammogram and ultrasound 03/24/2018: Benign  Return to clinic in1 yearfor follow-up andbreast exams and surveillance   I discussed the assessment and treatment plan with the patient. The patient was provided an opportunity to ask questions and all were answered. The patient agreed with the plan and demonstrated an understanding of the instructions. The patient was advised to call back or seek an in-person evaluation if the symptoms worsen or if the condition fails to improve as anticipated.   I provided 11 minutes of face-to-face Doximity time during this encounter.    Rulon Eisenmenger, MD 01/13/2019   I, Molly Dorshimer, am acting as scribe for Nicholas Lose, MD.  I have reviewed the above documentation for accuracy and completeness, and I agree with the above.

## 2019-01-13 ENCOUNTER — Inpatient Hospital Stay: Payer: BLUE CROSS/BLUE SHIELD | Attending: Hematology and Oncology | Admitting: Hematology and Oncology

## 2019-01-13 DIAGNOSIS — C50112 Malignant neoplasm of central portion of left female breast: Secondary | ICD-10-CM

## 2019-01-13 DIAGNOSIS — Z170 Estrogen receptor positive status [ER+]: Secondary | ICD-10-CM

## 2019-01-13 DIAGNOSIS — Z79810 Long term (current) use of selective estrogen receptor modulators (SERMs): Secondary | ICD-10-CM

## 2019-01-13 MED ORDER — TAMOXIFEN CITRATE 20 MG PO TABS: 20.0000 mg | ORAL_TABLET | Freq: Every day | ORAL | 3 refills | Status: DC

## 2019-01-29 DIAGNOSIS — Z01812 Encounter for preprocedural laboratory examination: Secondary | ICD-10-CM | POA: Diagnosis not present

## 2019-01-29 DIAGNOSIS — Z1159 Encounter for screening for other viral diseases: Secondary | ICD-10-CM | POA: Diagnosis not present

## 2019-01-29 DIAGNOSIS — M7711 Lateral epicondylitis, right elbow: Secondary | ICD-10-CM | POA: Diagnosis not present

## 2019-02-01 DIAGNOSIS — G8918 Other acute postprocedural pain: Secondary | ICD-10-CM | POA: Diagnosis not present

## 2019-02-01 DIAGNOSIS — Z79899 Other long term (current) drug therapy: Secondary | ICD-10-CM | POA: Diagnosis not present

## 2019-02-01 DIAGNOSIS — M19021 Primary osteoarthritis, right elbow: Secondary | ICD-10-CM | POA: Diagnosis not present

## 2019-02-01 DIAGNOSIS — Z791 Long term (current) use of non-steroidal anti-inflammatories (NSAID): Secondary | ICD-10-CM | POA: Diagnosis not present

## 2019-02-01 DIAGNOSIS — M7711 Lateral epicondylitis, right elbow: Secondary | ICD-10-CM | POA: Diagnosis not present

## 2019-02-01 DIAGNOSIS — M25521 Pain in right elbow: Secondary | ICD-10-CM | POA: Diagnosis not present

## 2019-02-01 DIAGNOSIS — M1712 Unilateral primary osteoarthritis, left knee: Secondary | ICD-10-CM | POA: Diagnosis not present

## 2019-02-01 DIAGNOSIS — M5416 Radiculopathy, lumbar region: Secondary | ICD-10-CM | POA: Diagnosis not present

## 2019-02-01 DIAGNOSIS — Z87891 Personal history of nicotine dependence: Secondary | ICD-10-CM | POA: Diagnosis not present

## 2019-02-18 DIAGNOSIS — N390 Urinary tract infection, site not specified: Secondary | ICD-10-CM | POA: Diagnosis not present

## 2019-03-03 DIAGNOSIS — M79671 Pain in right foot: Secondary | ICD-10-CM | POA: Diagnosis not present

## 2019-03-23 DIAGNOSIS — C50912 Malignant neoplasm of unspecified site of left female breast: Secondary | ICD-10-CM | POA: Diagnosis not present

## 2019-03-23 DIAGNOSIS — Z9011 Acquired absence of right breast and nipple: Secondary | ICD-10-CM | POA: Diagnosis not present

## 2019-03-29 DIAGNOSIS — C50912 Malignant neoplasm of unspecified site of left female breast: Secondary | ICD-10-CM | POA: Diagnosis not present

## 2019-03-29 DIAGNOSIS — Z9011 Acquired absence of right breast and nipple: Secondary | ICD-10-CM | POA: Diagnosis not present

## 2019-03-30 DIAGNOSIS — M79672 Pain in left foot: Secondary | ICD-10-CM | POA: Diagnosis not present

## 2019-03-30 DIAGNOSIS — M79671 Pain in right foot: Secondary | ICD-10-CM | POA: Diagnosis not present

## 2019-03-31 DIAGNOSIS — C50912 Malignant neoplasm of unspecified site of left female breast: Secondary | ICD-10-CM | POA: Diagnosis not present

## 2019-03-31 DIAGNOSIS — Z9011 Acquired absence of right breast and nipple: Secondary | ICD-10-CM | POA: Diagnosis not present

## 2019-04-01 DIAGNOSIS — C50912 Malignant neoplasm of unspecified site of left female breast: Secondary | ICD-10-CM | POA: Diagnosis not present

## 2019-04-01 DIAGNOSIS — Z9011 Acquired absence of right breast and nipple: Secondary | ICD-10-CM | POA: Diagnosis not present

## 2019-04-20 DIAGNOSIS — N898 Other specified noninflammatory disorders of vagina: Secondary | ICD-10-CM | POA: Diagnosis not present

## 2019-05-04 DIAGNOSIS — Z0000 Encounter for general adult medical examination without abnormal findings: Secondary | ICD-10-CM | POA: Diagnosis not present

## 2019-05-12 DIAGNOSIS — Z713 Dietary counseling and surveillance: Secondary | ICD-10-CM | POA: Diagnosis not present

## 2019-05-12 DIAGNOSIS — Z6831 Body mass index (BMI) 31.0-31.9, adult: Secondary | ICD-10-CM | POA: Diagnosis not present

## 2019-05-12 DIAGNOSIS — Z5181 Encounter for therapeutic drug level monitoring: Secondary | ICD-10-CM | POA: Diagnosis not present

## 2019-06-07 DIAGNOSIS — Z800 Family history of malignant neoplasm of digestive organs: Secondary | ICD-10-CM | POA: Diagnosis not present

## 2019-06-07 DIAGNOSIS — K5904 Chronic idiopathic constipation: Secondary | ICD-10-CM | POA: Diagnosis not present

## 2019-06-07 DIAGNOSIS — K625 Hemorrhage of anus and rectum: Secondary | ICD-10-CM | POA: Diagnosis not present

## 2019-06-07 DIAGNOSIS — Z1211 Encounter for screening for malignant neoplasm of colon: Secondary | ICD-10-CM | POA: Diagnosis not present

## 2019-06-14 DIAGNOSIS — Z23 Encounter for immunization: Secondary | ICD-10-CM | POA: Diagnosis not present

## 2019-06-14 DIAGNOSIS — L821 Other seborrheic keratosis: Secondary | ICD-10-CM | POA: Diagnosis not present

## 2019-06-14 DIAGNOSIS — R7303 Prediabetes: Secondary | ICD-10-CM | POA: Diagnosis not present

## 2019-06-14 DIAGNOSIS — F330 Major depressive disorder, recurrent, mild: Secondary | ICD-10-CM | POA: Diagnosis not present

## 2019-07-18 DIAGNOSIS — Z1211 Encounter for screening for malignant neoplasm of colon: Secondary | ICD-10-CM | POA: Diagnosis not present

## 2019-07-18 DIAGNOSIS — K6389 Other specified diseases of intestine: Secondary | ICD-10-CM | POA: Diagnosis not present

## 2019-07-18 DIAGNOSIS — K635 Polyp of colon: Secondary | ICD-10-CM | POA: Diagnosis not present

## 2019-07-18 DIAGNOSIS — D123 Benign neoplasm of transverse colon: Secondary | ICD-10-CM | POA: Diagnosis not present

## 2019-08-07 ENCOUNTER — Other Ambulatory Visit: Payer: Self-pay

## 2019-08-07 ENCOUNTER — Ambulatory Visit
Admission: EM | Admit: 2019-08-07 | Discharge: 2019-08-07 | Disposition: A | Discharge status: Home / Self Care | Payer: BC Managed Care – PPO | Attending: Physician Assistant | Admitting: Physician Assistant

## 2019-08-07 DIAGNOSIS — Z20828 Contact with and (suspected) exposure to other viral communicable diseases: Secondary | ICD-10-CM | POA: Diagnosis not present

## 2019-08-07 DIAGNOSIS — J069 Acute upper respiratory infection, unspecified: Secondary | ICD-10-CM

## 2019-08-07 LAB — POC SARS CORONAVIRUS 2 AG -  ED: SARS Coronavirus 2 Ag: NEGATIVE

## 2019-08-07 MED ORDER — BENZONATATE 200 MG PO CAPS: 200.0000 mg | ORAL_CAPSULE | Freq: Three times a day (TID) | ORAL | 0 refills | Status: DC

## 2019-08-07 NOTE — ED Provider Notes (Signed)
[name]    CSN: [id number] Arrival date & time: 08/07/19  0801      History   Chief Complaint Chief Complaint  Patient presents with  . Sore Throat  . Cough    HPI Christina [name] is a 53 y.o. female.   53 year old female comes in for 5 day history of URI symptoms. Has had cough, sore throat. Denies rhinorrhea, nasal congestion. Subjective fever last night. Feels taste is different from normal. Denies chills, body aches. Denies abdominal pain, nausea, vomiting, diarrhea. Denies shortness of breath, loss of smell. Positive COVID contact x 2.      Past Medical History:  Diagnosis Date  . Anxiety   . Arthritis    hands and knees  . 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 Depression   . Ovarian cyst    "they come and go" (01/09/2016)  . PONV (postoperative nausea and vomiting)    does well with scop patch  . Urgency of urination     Patient Active Problem List   Diagnosis Date Noted  . Breast cancer, left ([place]) 01/09/2016  . Genetic testing 12/31/2015  . 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 Family history of breast cancer in female 11/08/2015  . Family history of colon cancer 11/08/2015  . Malignant neoplasm of central portion of left breast in female, estrogen receptor positive ([place]) 10/31/2015  . Endometriosis 03/11/2011    Past Surgical History:  Procedure Laterality Date  . ABDOMINAL HYSTERECTOMY  05/1996   endometriosis  . ACHILLES TENDON REPAIR Right 2010  . APPENDECTOMY  [id number]   Early mucocele of appendix, removal of small leiomyoma from pelvis, lysis of adhesions  . BREAST BIOPSY Left 10/2015  . BREAST IMPLANT REMOVAL Bilateral 08/12/2017   Procedure: REMOVAL BILATERAL BREAST IMPLANTS;  Surgeon: Wallace Going, DO;  Location: [place];  Service: Plastics;  Laterality: Bilateral;  . BREAST RECONSTRUCTION WITH PLACEMENT OF TISSUE EXPANDER AND FLEX HD (ACELLULAR HYDRATED DERMIS)  Bilateral 01/09/2016  . BREAST RECONSTRUCTION WITH PLACEMENT OF TISSUE EXPANDER AND FLEX HD (ACELLULAR HYDRATED DERMIS) Bilateral 01/09/2016   Procedure: BREAST RECONSTRUCTION WITH PLACEMENT OF TISSUE EXPANDER AND FLEX HD (ACELLULAR HYDRATED DERMIS);  Surgeon: Loel Lofty Dillingham, DO;  Location: [place];  Service: Plastics;  Laterality: Bilateral;  . BREAST RECONSTRUCTION WITH PLACEMENT OF TISSUE EXPANDER AND FLEX HD (ACELLULAR HYDRATED DERMIS) Bilateral 05/29/2016   Procedure: PLACEMENT OF BILATERAL TISSUE EXPANDER AND FLEX HD (ACELLULAR HYDRATED DERMIS);  Surgeon: Wallace Going, DO;  Location: [place];  Service: Plastics;  Laterality: Bilateral;  . BREAST REDUCTION SURGERY Bilateral 11/26/2016   Procedure: BILATERAL BREAST CAPSULE CONTRACTURE RELASE;  Surgeon: Wallace Going, DO;  Location: [place];  Service: Plastics;  Laterality: Bilateral;  . [place]; 1989; 1991  . INCISION AND DRAINAGE OF WOUND Bilateral 02/11/2016   Procedure: IRRIGATION AND DEBRIDEMENT OF BILATERAL BREAST POCKET;  Surgeon: Wallace Going, DO;  Location: [place];  Service: Plastics;  Laterality: Bilateral;  . LAPAROSCOPIC CHOLECYSTECTOMY  ~ 1999  . LIPOSUCTION WITH LIPOFILLING Bilateral 11/26/2016   Procedure: LIPOFILLING FOR SYMMETRY;  Surgeon: Wallace Going, DO;  Location: [place];  Service: Plastics;  Laterality: Bilateral;  . LIPOSUCTION WITH LIPOFILLING Bilateral 01/21/2017   Procedure: BILATERAL BREAST  LIPOFILLING FOR ASYMMETRY;  Surgeon: Wallace Going, DO;  Location: [place];  Service: Plastics;  Laterality: Bilateral;  .  MASTECTOMY Bilateral 01/09/2016  . NIPPLE SPARING MASTECTOMY/SENTINAL LYMPH NODE BIOPSY/RECONSTRUCTION/PLACEMENT OF TISSUE EXPANDER Bilateral 01/09/2016   Procedure: BILATERAL NIPPLE SPARING MASTECTOMY WITH LEFT SENTINAL LYMPH NODE BIOPSY ;  Surgeon: Stark Klein, MD;  Location: [place];  Service: General;  Laterality: Bilateral;  . REMOVAL OF BILATERAL TISSUE EXPANDERS WITH PLACEMENT OF BILATERAL BREAST IMPLANTS Bilateral 08/20/2016   Procedure: REMOVAL OF BILATERAL TISSUE EXPANDERS WITH PLACEMENT OF BILATERAL SILICONE IMPLANTS;  Surgeon: Wallace Going, DO;  Location: [place];  Service: Plastics;  Laterality: Bilateral;  . REMOVAL OF BILATERAL TISSUE EXPANDERS WITH PLACEMENT OF BILATERAL BREAST IMPLANTS Bilateral 11/05/2017   Procedure: REMOVAL OF BILATERAL TISSUE EXPANDERS WITH PLACEMENT OF BILATERAL BREAST SILICONE IMPLANTS;  Surgeon: Wallace Going, DO;  Location: [place];  Service: Plastics;  Laterality: Bilateral;  . REMOVAL OF TISSUE EXPANDER Bilateral 02/11/2016   Procedure: REMOVAL OF BILATERAL TISSUE EXPANDERS AND FLEX HD REMOVAL;  Surgeon: Wallace Going, DO;  Location: [place];  Service: Plastics;  Laterality: Bilateral;  . TISSUE EXPANDER PLACEMENT Bilateral 08/12/2017   Procedure: PLACEMENT OF BILATERAL TISSUE EXPANDER;  Surgeon: Wallace Going, DO;  Location: [place];  Service: Plastics;  Laterality: Bilateral;  . TUBAL LIGATION  1991    OB History    Gravida  4   Para  3   Term      Preterm      AB      Living  3     SAB      TAB      Ectopic      Multiple      Live Births               Home Medications    Prior to Admission medications   Medication Sig Start Date End Date Taking? Authorizing Provider  amitriptyline (ELAVIL) 100 MG tablet Take 100 mg by mouth at bedtime.    [provider]  benzonatate (TESSALON) 200 MG capsule Take 1 capsule (200 mg total) by mouth every 8 (eight) hours. 08/07/19   Ok Edwards, PA-C  tamoxifen (NOLVADEX) 20 MG tablet Take 1 tablet (20 mg total) by mouth daily. 01/13/19   Nicholas Lose, MD  venlafaxine XR (EFFEXOR-XR) 75 MG 24 hr capsule TAKE ONE CAPSULE BY MOUTH ONCE DAILY WITH  BREAKFAST 03/16/17    Nicholas Lose, MD    Family History Family History  Problem Relation Age of Onset  . Heart failure Father   . Prostate cancer Father 45  . Colon polyps Mother        approx 2  . Other Mother        hx HPV and hysterectomy due to precancerous cells  . Other Sister 53       hx of hysterectomy for unspecified reason; still has ovaries  . Other Sister 89       paternal half-sister hx of hysterectomy for unspecified reason; still has ovaries  . Bladder Cancer Maternal Uncle 79       not a smoker  . Kidney failure Maternal Grandmother   . Congestive Heart Failure Maternal Grandmother   . Colon cancer Maternal Grandmother 80  . Lung cancer Maternal Grandfather 62       smoker  . Breast cancer Paternal Grandmother        dx. early 79s; w/ hx of trauma to breast  . Crohn's disease Daughter   . Lung cancer Maternal Uncle 11  smoker    Social History Social History   Tobacco Use  . Smoking status: Former Smoker    Packs/day: 1.00    Years: 10.00    Pack years: 10.00    Types: Cigarettes    Quit date: 06/08/2008    Years since quitting: 11.1  . Smokeless tobacco: Never Used  Substance Use Topics  . Alcohol use: No  . Drug use: No     Allergies   Patient has no known allergies.   Review of Systems Review of Systems  Reason unable to perform ROS: See HPI as above.     Physical Exam Triage Vital Signs ED Triage Vitals [08/07/19 0812]  Enc Vitals Group     BP 132/79     Pulse Rate 70     Resp 18     Temp 98.1 F (36.7 C)     Temp src      SpO2 100 %     Weight      Height      Head Circumference      Peak Flow      Pain Score 0     Pain Loc      Pain Edu?      Excl. in [place]?    No data found.  Updated Vital Signs BP 132/79   Pulse 70   Temp 98.1 F (36.7 C)   Resp 18   SpO2 100%   Physical Exam Constitutional:      General: She is not in acute distress.    Appearance: Normal appearance. She is not ill-appearing, toxic-appearing or  diaphoretic.  HENT:     Head: Normocephalic and atraumatic.     Mouth/Throat:     Mouth: Mucous membranes are moist.     Pharynx: Oropharynx is clear. Uvula midline.  Neck:     Musculoskeletal: Normal range of motion and neck supple.  Cardiovascular:     Rate and Rhythm: Normal rate and regular rhythm.     Heart sounds: Normal heart sounds. No murmur. No friction rub. No gallop.   Pulmonary:     Effort: Pulmonary effort is normal. No accessory muscle usage, prolonged expiration, respiratory distress or retractions.     Comments: Lungs clear to auscultation without adventitious lung sounds. Neurological:     General: No focal deficit present.     Mental Status: She is alert and oriented to person, place, and time.      UC Treatments / Results  Labs (all labs ordered are listed, but only abnormal results are displayed) Labs Reviewed  NOVEL CORONAVIRUS, NAA  POC SARS CORONAVIRUS 2 AG -  ED    EKG   Radiology No results found.  Procedures Procedures (including critical care time)  Medications Ordered in UC Medications - No data to display  Initial Impression / Assessment and Plan / UC Course  I have reviewed the triage vital signs and the nursing notes.  Pertinent labs & imaging results that were available during my care of the patient were reviewed by me and considered in my medical decision making (see chart for details).    Rapid COVID negative. PCR test ordered. Patient to quarantine until testing results return. No alarming signs on exam.  Patient speaking in full sentences without respiratory distress.  Symptomatic treatment discussed.  Push fluids.  Return precautions given.  Patient expresses understanding and agrees to plan.  Final Clinical Impressions(s) / UC Diagnoses   Final diagnoses:  Viral URI   ED  Prescriptions    Medication Sig Dispense Auth. Provider   benzonatate (TESSALON) 200 MG capsule Take 1 capsule (200 mg total) by mouth every 8 (eight)  hours. 21 capsule Ok Edwards, PA-C     PDMP not reviewed this encounter.   Ok Edwards, PA-C 08/07/19 [phone]

## 2019-08-07 NOTE — ED Triage Notes (Signed)
Pt presents with complaints of sore throat and cough that started on Wednesday. The patient has been in close contact with a coworker that is positive for COVID.

## 2019-08-07 NOTE — Discharge Instructions (Addendum)
Rapid COVID negative. PCR testing ordered. I would like you to quarantine until testing results. You can take over the counter flonase/nasacort to help with nasal congestion/drainage. Tessalon as needed for cough. If experiencing shortness of breath, trouble breathing, go to the emergency department for further evaluation needed.

## 2019-08-08 LAB — NOVEL CORONAVIRUS, NAA: SARS-CoV-2, NAA: NOT DETECTED

## 2019-08-12 DIAGNOSIS — H40033 Anatomical narrow angle, bilateral: Secondary | ICD-10-CM | POA: Diagnosis not present

## 2019-08-12 DIAGNOSIS — H43393 Other vitreous opacities, bilateral: Secondary | ICD-10-CM | POA: Diagnosis not present

## 2019-12-13 ENCOUNTER — Other Ambulatory Visit: Payer: Self-pay

## 2019-12-13 ENCOUNTER — Encounter (HOSPITAL_BASED_OUTPATIENT_CLINIC_OR_DEPARTMENT_OTHER): Payer: Self-pay | Admitting: Orthopedic Surgery

## 2019-12-13 NOTE — Progress Notes (Signed)
Spoke w/ via phone for pre-op interview--- PT Lab needs dos---- Istat 8 and EKG              Lab results------ no COVID test ------ 12-16-2019 @ [id number] Arrive at ------- 0730 NPO after ------ MN w/ exception clear liquids until 0630 then nothing by mouth (no cream/ milk products) Medications to take morning of surgery ----- Celexa, Tamoxifen w/ sips of water Diabetic medication ----- does not take meds Patient Special Instructions ----- n/a Pre-Op special Istructions ----- n/a Patient verbalized understanding of instructions that were given at this phone interview. Patient denies shortness of breath, chest pain, fever, cough a this phone interview.

## 2019-12-16 ENCOUNTER — Other Ambulatory Visit (HOSPITAL_COMMUNITY)
Admission: RE | Admit: 2019-12-16 | Discharge: 2019-12-16 | Disposition: A | Discharge status: Home / Self Care | Payer: 59 | Source: Ambulatory Visit | Attending: Orthopedic Surgery | Admitting: Orthopedic Surgery

## 2019-12-16 DIAGNOSIS — Z01812 Encounter for preprocedural laboratory examination: Secondary | ICD-10-CM | POA: Diagnosis present

## 2019-12-16 DIAGNOSIS — Z20822 Contact with and (suspected) exposure to covid-19: Secondary | ICD-10-CM | POA: Diagnosis not present

## 2019-12-16 LAB — SARS CORONAVIRUS 2 (TAT 6-24 HRS): SARS Coronavirus 2: NEGATIVE

## 2019-12-20 ENCOUNTER — Encounter (HOSPITAL_BASED_OUTPATIENT_CLINIC_OR_DEPARTMENT_OTHER)
Admission: RE | Disposition: A | Discharge status: Home / Self Care | Payer: Self-pay | Source: Home / Self Care | Attending: Orthopedic Surgery

## 2019-12-20 ENCOUNTER — Ambulatory Visit (HOSPITAL_COMMUNITY)
Admission: RE | Admit: 2019-12-20 | Discharge: 2019-12-20 | Disposition: A | Discharge status: Home / Self Care | Payer: 59 | Attending: Orthopedic Surgery | Admitting: Orthopedic Surgery

## 2019-12-20 ENCOUNTER — Ambulatory Visit (HOSPITAL_BASED_OUTPATIENT_CLINIC_OR_DEPARTMENT_OTHER): Payer: 59 | Admitting: Certified Registered"

## 2019-12-20 ENCOUNTER — Encounter (HOSPITAL_BASED_OUTPATIENT_CLINIC_OR_DEPARTMENT_OTHER): Payer: Self-pay | Admitting: Orthopedic Surgery

## 2019-12-20 ENCOUNTER — Other Ambulatory Visit: Payer: Self-pay

## 2019-12-20 DIAGNOSIS — Z79899 Other long term (current) drug therapy: Secondary | ICD-10-CM | POA: Insufficient documentation

## 2019-12-20 DIAGNOSIS — Z170 Estrogen receptor positive status [ER+]: Secondary | ICD-10-CM | POA: Diagnosis not present

## 2019-12-20 DIAGNOSIS — Z9013 Acquired absence of bilateral breasts and nipples: Secondary | ICD-10-CM | POA: Diagnosis not present

## 2019-12-20 DIAGNOSIS — Z803 Family history of malignant neoplasm of breast: Secondary | ICD-10-CM | POA: Insufficient documentation

## 2019-12-20 DIAGNOSIS — F419 Anxiety disorder, unspecified: Secondary | ICD-10-CM | POA: Insufficient documentation

## 2019-12-20 DIAGNOSIS — F329 Major depressive disorder, single episode, unspecified: Secondary | ICD-10-CM | POA: Diagnosis not present

## 2019-12-20 DIAGNOSIS — X58XXXA Exposure to other specified factors, initial encounter: Secondary | ICD-10-CM | POA: Diagnosis not present

## 2019-12-20 DIAGNOSIS — Z87891 Personal history of nicotine dependence: Secondary | ICD-10-CM | POA: Insufficient documentation

## 2019-12-20 DIAGNOSIS — C50112 Malignant neoplasm of central portion of left female breast: Secondary | ICD-10-CM | POA: Diagnosis not present

## 2019-12-20 DIAGNOSIS — Z79810 Long term (current) use of selective estrogen receptor modulators (SERMs): Secondary | ICD-10-CM | POA: Diagnosis not present

## 2019-12-20 DIAGNOSIS — S83241A Other tear of medial meniscus, current injury, right knee, initial encounter: Secondary | ICD-10-CM | POA: Insufficient documentation

## 2019-12-20 DIAGNOSIS — S83242A Other tear of medial meniscus, current injury, left knee, initial encounter: Secondary | ICD-10-CM | POA: Diagnosis present

## 2019-12-20 HISTORY — DX: Personal history of other diseases of the female genital tract: Z87.42

## 2019-12-20 HISTORY — PX: KNEE ARTHROSCOPY WITH MEDIAL MENISECTOMY: SHX5651

## 2019-12-20 HISTORY — DX: Generalized anxiety disorder: F41.1

## 2019-12-20 HISTORY — DX: Unspecified tear of unspecified meniscus, current injury, right knee, initial encounter: S83.206A

## 2019-12-20 HISTORY — DX: Prediabetes: R73.03

## 2019-12-20 LAB — POCT I-STAT, CHEM 8
BUN: 17 mg/dL (ref 6–20)
Calcium, Ion: 1.44 mmol/L — ABNORMAL HIGH (ref 1.15–1.40)
Chloride: 107 mmol/L (ref 98–111)
Creatinine, Ser: 0.5 mg/dL (ref 0.44–1.00)
Glucose, Bld: 110 mg/dL — ABNORMAL HIGH (ref 70–99)
HCT: 40 % (ref 36.0–46.0)
Hemoglobin: 13.6 g/dL (ref 12.0–15.0)
Potassium: 4.1 mmol/L (ref 3.5–5.1)
Sodium: 139 mmol/L (ref 135–145)
TCO2: 23 mmol/L (ref 22–32)

## 2019-12-20 SURGERY — ARTHROSCOPY, KNEE, WITH MEDIAL MENISCECTOMY
Anesthesia: General | Site: Knee | Laterality: Right

## 2019-12-20 MED ORDER — CEFAZOLIN SODIUM-DEXTROSE 2-4 GM/100ML-% IV SOLN
INTRAVENOUS | Status: AC
  Filled 2019-12-20: qty 100

## 2019-12-20 MED ORDER — KETOROLAC TROMETHAMINE 30 MG/ML IJ SOLN
INTRAMUSCULAR | Status: AC
  Filled 2019-12-20: qty 1

## 2019-12-20 MED ORDER — MIDAZOLAM HCL 2 MG/2ML IJ SOLN
INTRAMUSCULAR | Status: DC | PRN
  Administered 2019-12-20: 2 mg via INTRAVENOUS

## 2019-12-20 MED ORDER — FENTANYL CITRATE (PF) 100 MCG/2ML IJ SOLN
INTRAMUSCULAR | Status: AC
  Filled 2019-12-20: qty 2

## 2019-12-20 MED ORDER — PROPOFOL 10 MG/ML IV BOLUS
INTRAVENOUS | Status: DC | PRN
  Administered 2019-12-20: 150 mg via INTRAVENOUS

## 2019-12-20 MED ORDER — DEXAMETHASONE SODIUM PHOSPHATE 10 MG/ML IJ SOLN
INTRAMUSCULAR | Status: AC
  Filled 2019-12-20: qty 1

## 2019-12-20 MED ORDER — MIDAZOLAM HCL 2 MG/2ML IJ SOLN
INTRAMUSCULAR | Status: AC
  Filled 2019-12-20: qty 2

## 2019-12-20 MED ORDER — HYDROMORPHONE HCL 1 MG/ML IJ SOLN
0.2500 mg | INTRAMUSCULAR | Status: DC | PRN
  Filled 2019-12-20: qty 0.5

## 2019-12-20 MED ORDER — LIDOCAINE 2% (20 MG/ML) 5 ML SYRINGE
INTRAMUSCULAR | Status: AC
  Filled 2019-12-20: qty 5

## 2019-12-20 MED ORDER — DEXAMETHASONE SODIUM PHOSPHATE 10 MG/ML IJ SOLN
INTRAMUSCULAR | Status: DC | PRN
  Administered 2019-12-20: 10 mg via INTRAVENOUS

## 2019-12-20 MED ORDER — ACETAMINOPHEN 500 MG PO TABS
1000.0000 mg | ORAL_TABLET | Freq: Once | ORAL | Status: AC
  Administered 2019-12-20: 1000 mg via ORAL
  Filled 2019-12-20: qty 2

## 2019-12-20 MED ORDER — SODIUM CHLORIDE 0.9 % IR SOLN
Status: DC | PRN
  Administered 2019-12-20: 6000 mL

## 2019-12-20 MED ORDER — MEPERIDINE HCL 25 MG/ML IJ SOLN
6.2500 mg | INTRAMUSCULAR | Status: DC | PRN
  Filled 2019-12-20: qty 1

## 2019-12-20 MED ORDER — SENNA-DOCUSATE SODIUM 8.6-50 MG PO TABS: 2.0000 | ORAL_TABLET | Freq: Every day | ORAL | 1 refills | Status: DC

## 2019-12-20 MED ORDER — LIDOCAINE 2% (20 MG/ML) 5 ML SYRINGE
INTRAMUSCULAR | Status: DC | PRN
  Administered 2019-12-20: 60 mg via INTRAVENOUS

## 2019-12-20 MED ORDER — ONDANSETRON 4 MG PO TBDP: 4.0000 mg | ORAL_TABLET | Freq: Three times a day (TID) | ORAL | 0 refills | Status: DC | PRN

## 2019-12-20 MED ORDER — PROPOFOL 10 MG/ML IV BOLUS
INTRAVENOUS | Status: AC
  Filled 2019-12-20: qty 20

## 2019-12-20 MED ORDER — CEFAZOLIN SODIUM-DEXTROSE 2-4 GM/100ML-% IV SOLN
2.0000 g | INTRAVENOUS | Status: AC
  Administered 2019-12-20: 2 g via INTRAVENOUS
  Filled 2019-12-20: qty 100

## 2019-12-20 MED ORDER — BUPIVACAINE HCL (PF) 0.5 % IJ SOLN
INTRAMUSCULAR | Status: DC | PRN
  Administered 2019-12-20: 20 mL

## 2019-12-20 MED ORDER — FENTANYL CITRATE (PF) 100 MCG/2ML IJ SOLN
INTRAMUSCULAR | Status: DC | PRN
  Administered 2019-12-20 (×3): 50 ug via INTRAVENOUS

## 2019-12-20 MED ORDER — ACETAMINOPHEN 500 MG PO TABS
ORAL_TABLET | ORAL | Status: AC
  Filled 2019-12-20: qty 2

## 2019-12-20 MED ORDER — HYDROCODONE-ACETAMINOPHEN 10-325 MG PO TABS: 1.0000 | ORAL_TABLET | Freq: Four times a day (QID) | ORAL | 0 refills | Status: DC | PRN

## 2019-12-20 MED ORDER — SCOPOLAMINE 1 MG/3DAYS TD PT72
MEDICATED_PATCH | TRANSDERMAL | Status: DC | PRN
  Administered 2019-12-20: 1 via TRANSDERMAL

## 2019-12-20 MED ORDER — BACLOFEN 10 MG PO TABS: 10.0000 mg | ORAL_TABLET | Freq: Three times a day (TID) | ORAL | 0 refills | Status: DC

## 2019-12-20 MED ORDER — ONDANSETRON HCL 4 MG/2ML IJ SOLN
INTRAMUSCULAR | Status: DC | PRN
  Administered 2019-12-20: 4 mg via INTRAVENOUS

## 2019-12-20 MED ORDER — LACTATED RINGERS IV SOLN
INTRAVENOUS | Status: DC
  Filled 2019-12-20: qty 1000

## 2019-12-20 MED ORDER — ONDANSETRON HCL 4 MG/2ML IJ SOLN
4.0000 mg | Freq: Once | INTRAMUSCULAR | Status: DC | PRN
  Filled 2019-12-20: qty 2

## 2019-12-20 MED ORDER — KETOROLAC TROMETHAMINE 30 MG/ML IJ SOLN
INTRAMUSCULAR | Status: DC | PRN
  Administered 2019-12-20: 30 mg via INTRAVENOUS

## 2019-12-20 MED ORDER — ONDANSETRON HCL 4 MG/2ML IJ SOLN
INTRAMUSCULAR | Status: AC
  Filled 2019-12-20: qty 2

## 2019-12-20 MED ORDER — SCOPOLAMINE 1 MG/3DAYS TD PT72
MEDICATED_PATCH | TRANSDERMAL | Status: AC
  Filled 2019-12-20: qty 1

## 2019-12-20 SURGICAL SUPPLY — 40 items
BANDAGE ESMARK 6X9 LF (GAUZE/BANDAGES/DRESSINGS) IMPLANT
BLADE CUTTER GATOR 3.5 (BLADE) ×2 IMPLANT
BNDG ELASTIC 6X5.8 VLCR STR LF (GAUZE/BANDAGES/DRESSINGS) ×2 IMPLANT
BNDG ESMARK 6X9 LF (GAUZE/BANDAGES/DRESSINGS)
BNDG GAUZE ELAST 4 BULKY (GAUZE/BANDAGES/DRESSINGS) ×1 IMPLANT
CLSR STERI-STRIP ANTIMIC 1/2X4 (GAUZE/BANDAGES/DRESSINGS) ×2 IMPLANT
COVER WAND RF STERILE (DRAPES) ×2 IMPLANT
CUFF TOURN SGL QUICK 34 (TOURNIQUET CUFF) ×2
CUFF TRNQT CYL 34X4.125X (TOURNIQUET CUFF) IMPLANT
CUTTER TENSIONER SUT 2-0 0 FBW (INSTRUMENTS) IMPLANT
DISSECTOR  3.8MM X 13CM (MISCELLANEOUS) ×2
DISSECTOR 3.8MM X 13CM (MISCELLANEOUS) IMPLANT
DRAPE ARTHROSCOPY W/POUCH 114 (DRAPES) ×2 IMPLANT
DRAPE IMP U-DRAPE 54X76 (DRAPES) ×2 IMPLANT
DRAPE U-SHAPE 47X51 STRL (DRAPES) ×2 IMPLANT
DRSG PAD ABDOMINAL 8X10 ST (GAUZE/BANDAGES/DRESSINGS) ×1 IMPLANT
DRSG TEGADERM 4X4.75 (GAUZE/BANDAGES/DRESSINGS) ×1 IMPLANT
DURAPREP 26ML APPLICATOR (WOUND CARE) ×2 IMPLANT
ELECT REM PT RETURN 9FT ADLT (ELECTROSURGICAL)
ELECTRODE REM PT RTRN 9FT ADLT (ELECTROSURGICAL) IMPLANT
EXCALIBUR 3.8MM X 13CM (MISCELLANEOUS) ×1 IMPLANT
GAUZE SPONGE 4X4 12PLY STRL (GAUZE/BANDAGES/DRESSINGS) ×2 IMPLANT
GLOVE BIO SURGEON STRL SZ8 (GLOVE) ×2 IMPLANT
GLOVE BIOGEL PI IND STRL 8 (GLOVE) ×2 IMPLANT
GLOVE BIOGEL PI INDICATOR 8 (GLOVE) ×2
GLOVE ORTHO TXT STRL SZ7.5 (GLOVE) ×2 IMPLANT
GOWN STRL REUS W/ TWL LRG LVL3 (GOWN DISPOSABLE) ×1 IMPLANT
GOWN STRL REUS W/ TWL XL LVL3 (GOWN DISPOSABLE) ×2 IMPLANT
GOWN STRL REUS W/TWL LRG LVL3 (GOWN DISPOSABLE) ×2
GOWN STRL REUS W/TWL XL LVL3 (GOWN DISPOSABLE) ×4
MANIFOLD NEPTUNE II (INSTRUMENTS) ×2 IMPLANT
PACK ARTHROSCOPY DSU (CUSTOM PROCEDURE TRAY) ×2 IMPLANT
PACK BASIN DAY SURGERY FS (CUSTOM PROCEDURE TRAY) ×2 IMPLANT
PENCIL BUTTON HOLSTER BLD 10FT (ELECTRODE) IMPLANT
PROBE BIPOLAR ATHRO 135MM 90D (MISCELLANEOUS) IMPLANT
SLEEVE SCD COMPRESS KNEE MED (MISCELLANEOUS) IMPLANT
STRIP CLOSURE SKIN 1/2X4 (GAUZE/BANDAGES/DRESSINGS) ×1 IMPLANT
SUT MNCRL AB 4-0 PS2 18 (SUTURE) ×2 IMPLANT
TUBING ARTHRO INFLOW-ONLY STRL (TUBING) ×2 IMPLANT
WATER STERILE IRR 1000ML POUR (IV SOLUTION) ×2 IMPLANT

## 2019-12-20 NOTE — Anesthesia Procedure Notes (Signed)
Procedure Name: LMA Insertion Date/Time: 12/20/2019 11:11 AM Performed by: Mechele Claude, CRNA Pre-anesthesia Checklist: Patient identified, Emergency Drugs available, Suction available and Patient being monitored Patient Re-evaluated:Patient Re-evaluated prior to induction Oxygen Delivery Method: Circle system utilized Preoxygenation: Pre-oxygenation with 100% oxygen Induction Type: IV induction Ventilation: Mask ventilation without difficulty LMA: LMA inserted LMA Size: 4.0 Number of attempts: 1 Airway Equipment and Method: Bite block Placement Confirmation: positive ETCO2 Tube secured with: Tape Dental Injury: Teeth and Oropharynx as per pre-operative assessment

## 2019-12-20 NOTE — Op Note (Signed)
12/20/2019  1:47 PM  PATIENT:  Christina [name]    PRE-OPERATIVE DIAGNOSIS:  RIGHT KNEE MEDIAL MENISCUS TEAR  POST-OPERATIVE DIAGNOSIS:  Same  PROCEDURE:  KNEE ARTHROSCOPY WITH MEDIAL MENISECTOMY  SURGEON:  Johnny Bridge, MD  PHYSICIAN ASSISTANT: Merlene Pulling, PA-C, present and scrubbed throughout the case, critical for completion in a timely fashion, and for retraction, instrumentation, and closure.  ANESTHESIA:   General  PREOPERATIVE INDICATIONS:  Christina [name] is a  54 y.o. female with a diagnosis of [place] who failed conservative measures and elected for surgical management.    The risks benefits and alternatives were discussed with the patient preoperatively including but not limited to the risks of infection, bleeding, nerve injury, cardiopulmonary complications, the need for revision surgery, among others, and the patient was willing to proceed.  ESTIMATED BLOOD LOSS: Minimal  OPERATIVE IMPLANTS: None  OPERATIVE FINDINGS: The patellofemoral joint had some grade 2 changes.  Medial femoral condyle had some fibrillation, no exposed bone, but extensive areas of both grade 2 and grade 3 changes.  The tibia had some softening with grade 2 changes as well.  Lateral compartment was relatively preserved.  The ACL was intact.  The lateral meniscus was intact.  The medial meniscus had a very small undersurface tear directly at the mid posterior body.  The root looks stable from what I could tell.  OPERATIVE PROCEDURE: The patient was brought to the operating room and placed in supine position.  General anesthesia was administered.  IV antibiotics were given.  The right lower extremity was prepped and draped in usual sterile fashion.  Timeout performed.  She had a small area over her medial portal site that looked like it was an old scar in a vertical orientation so I elected to use vertical portals.  This was basically in line with the scar.  I introduced the  arthroscope, performed a diagnostic arthroscopy, debrided a small amount of the undersurface of the patella with the shaver, and also performed a light chondroplasty of the medial femoral condyle with the shaver.  The arthroscopic basket and the arthroscopic shaver was used to debride the medial meniscus back to a stable configuration.  The tear was not terribly large, but did look large enough that it could be contributing to symptoms.  After complete debridement was carried out I used the arthroscope to complete the diagnostic arthroscopy, and then removed the instruments and repaired the portals with Monocryl followed by Steri-Strips and sterile gauze.  She was awakened and returned to the PACU in stable and satisfactory condition.  There were no complications and she tolerated the procedure well.

## 2019-12-20 NOTE — Anesthesia Postprocedure Evaluation (Signed)
Anesthesia Post Note  Patient: Christina [name]  Procedure(s) Performed: KNEE ARTHROSCOPY WITH MEDIAL MENISECTOMY (Right Knee)     Patient location during evaluation: PACU Anesthesia Type: General Level of consciousness: awake and alert Pain management: pain level controlled Vital Signs Assessment: post-procedure vital signs reviewed and stable Respiratory status: spontaneous breathing, nonlabored ventilation, respiratory function stable and patient connected to nasal cannula oxygen Cardiovascular status: blood pressure returned to baseline and stable Postop Assessment: no apparent nausea or vomiting Anesthetic complications: no    Last Vitals:  Vitals:   12/20/19 1245 12/20/19 1304  BP: 131/78 125/85  Pulse: 67 61  Resp: 14 16  Temp:  36.7 C  SpO2: 95% 97%    Last Pain:  Vitals:   12/20/19 1304  PainSc: 0-No pain                 Ajax Schroll DAVID

## 2019-12-20 NOTE — Anesthesia Preprocedure Evaluation (Signed)
Anesthesia Evaluation  Patient identified by MRN, date of birth, ID band Patient awake    Reviewed: Allergy & Precautions, NPO status , Patient's Chart, lab work & pertinent test results  History of Anesthesia Complications (+) PONV  Airway Mallampati: I  TM Distance: >3 FB Neck ROM: Full    Dental   Pulmonary former smoker,    Pulmonary exam normal        Cardiovascular Normal cardiovascular exam     Neuro/Psych Anxiety Depression    GI/Hepatic   Endo/Other    Renal/GU      Musculoskeletal   Abdominal   Peds  Hematology   Anesthesia Other Findings   Reproductive/Obstetrics                             Anesthesia Physical Anesthesia Plan  ASA: II  Anesthesia Plan: General   Post-op Pain Management:    Induction: Intravenous  PONV Risk Score and Plan: 4 or greater and Ondansetron, Midazolam, Scopolamine patch - Pre-op and Treatment may vary due to age or medical condition  Airway Management Planned: LMA  Additional Equipment:   Intra-op Plan:   Post-operative Plan: Extubation in OR  Informed Consent: I have reviewed the patients History and Physical, chart, labs and discussed the procedure including the risks, benefits and alternatives for the proposed anesthesia with the patient or authorized representative who has indicated his/her understanding and acceptance.       Plan Discussed with: CRNA and Surgeon  Anesthesia Plan Comments:         Anesthesia Quick Evaluation

## 2019-12-20 NOTE — Transfer of Care (Signed)
  Last Vitals:  Vitals Value Taken Time  BP    Temp    Pulse    Resp    SpO2      Last Pain:  Vitals:   12/20/19 0747  PainSc: 0-No pain      Patients Stated Pain Goal: 5 (12/20/19 0747)  Immediate Anesthesia Transfer of Care Note  Patient: Christina [name]  Procedure(s) Performed: Procedure(s) (LRB): KNEE ARTHROSCOPY WITH MEDIAL MENISECTOMY (Right)  Patient Location: PACU  Anesthesia Type: General  Level of Consciousness: awake, alert  and oriented  Airway & Oxygen Therapy: Patient Spontanous Breathing and Patient connected to nasal cannula oxygen  Post-op Assessment: Report given to PACU RN and Post -op Vital signs reviewed and stable  Post vital signs: Reviewed and stable  Complications: No apparent anesthesia complications

## 2019-12-20 NOTE — Discharge Instructions (Signed)
Post Anesthesia Home Care Instructions  Activity: Get plenty of rest for the remainder of the day. A responsible individual must stay with you for 24 hours following the procedure.  For the next 24 hours, DO NOT: -Drive a car -Paediatric nurse -Drink alcoholic beverages -Take any medication unless instructed by your physician -Make any legal decisions or sign important papers.  Meals: Start with liquid foods such as gelatin or soup. Progress to regular foods as tolerated. Avoid greasy, spicy, heavy foods. If nausea and/or vomiting occur, drink only clear liquids until the nausea and/or vomiting subsides. Call your physician if vomiting continues.  Special Instructions/Symptoms: Your throat may feel dry or sore from the anesthesia or the breathing tube placed in your throat during surgery. If this causes discomfort, gargle with warm salt water. The discomfort should disappear within 24 hours.  If you had a scopolamine patch placed behind your ear for the management of post- operative nausea and/or vomiting:  1. The medication in the patch is effective for 72 hours, after which it should be removed.  Wrap patch in a tissue and discard in the trash. Wash hands thoroughly with soap and water. 2. You may remove the patch earlier than 72 hours if you experience unpleasant side effects which may include dry mouth, dizziness or visual disturbances. 3. Avoid touching the patch. Wash your hands with soap and water after contact with the patch.    Diet: As you were doing prior to hospitalization   Shower:  May shower but keep the wounds dry, use an occlusive plastic wrap, NO SOAKING IN TUB.  If the bandage gets wet, change with a clean dry gauze.  If you have a splint on, leave the splint in place and keep the splint dry with a plastic bag.  Dressing:  You may change your dressing 3-5 days after surgery, unless you have a splint.  If you have a splint, then just leave the splint in place and we  will change your bandages during your first follow-up appointment.    If you had hand or foot surgery, we will plan to remove your stitches in about 2 weeks in the office.  For all other surgeries, there are sticky tapes (steri-strips) on your wounds and all the stitches are absorbable.  Leave the steri-strips in place when changing your dressings, they will peel off with time, usually 2-3 weeks.  Activity:  Increase activity slowly as tolerated, but follow the weight bearing instructions below.  The rules on driving is that you can not be taking narcotics while you drive, and you must feel in control of the vehicle.    Weight Bearing:   Weight bearing as tolerated.    To prevent constipation: you may use a stool softener such as -  Colace (over the counter) 100 mg by mouth twice a day  Drink plenty of fluids (prune juice may be helpful) and high fiber foods Miralax (over the counter) for constipation as needed.    Itching:  If you experience itching with your medications, try taking only a single pain pill, or even half a pain pill at a time.  You may take up to 10 pain pills per day, and you can also use benadryl over the counter for itching or also to help with sleep.   Precautions:  If you experience chest pain or shortness of breath - call 911 immediately for transfer to the hospital emergency department!!  If you develop a fever greater that 101 F,  purulent drainage from wound, increased redness or drainage from wound, or calf pain -- Call the office at [phone]                                                Follow- Up Appointment:  Please call for an appointment to be seen in 2 weeks May - [phone]

## 2019-12-20 NOTE — H&P (Signed)
PREOPERATIVE H&P  Chief Complaint: Right knee pain  HPI: Christina [name] is a 54 y.o. female who presents for preoperative history and physical with a diagnosis of right knee medial meniscus tear. Symptoms are rated as moderate to severe, and have been worsening.  This is significantly impairing activities of daily living.  She has elected for surgical management.   She has had pain for about 3 to 4 months, rated 8/10, fluctuating in intensity.  Pain is mostly anteriorly, somewhat laterally, somewhat medially, fairly diffuse, she is failed injections and anti-inflammatories.    Past Medical History:  Diagnosis Date  . Arthritis    hands and knees  . 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 Depression   . GAD (generalized anxiety disorder)   . History of endometriosis   . History of ovarian cyst   . PONV (postoperative nausea and vomiting)    does well with scop patch  . Pre-diabetes    followed by pcp  . Right knee meniscal tear   . Urgency of urination   . Wears dentures    upper  . Wears glasses    Past Surgical History:  Procedure Laterality Date  . ABDOMINAL HYSTERECTOMY  05/1996   endometriosis  . ACHILLES TENDON REPAIR Right 2010;  revision 2011  . BLADDER SUSPENSION  2000  . BREAST BIOPSY Left 10/2015  . BREAST IMPLANT REMOVAL Bilateral 08/12/2017   Procedure: REMOVAL BILATERAL BREAST IMPLANTS;  Surgeon: Wallace Going, DO;  Location: [place];  Service: Plastics;  Laterality: Bilateral;  . BREAST RECONSTRUCTION WITH PLACEMENT OF TISSUE EXPANDER AND FLEX HD (ACELLULAR HYDRATED DERMIS) Bilateral 01/09/2016  . BREAST RECONSTRUCTION WITH PLACEMENT OF TISSUE EXPANDER AND FLEX HD (ACELLULAR HYDRATED DERMIS) Bilateral 01/09/2016   Procedure: BREAST RECONSTRUCTION WITH PLACEMENT OF TISSUE EXPANDER AND FLEX  HD (ACELLULAR HYDRATED DERMIS);  Surgeon: Loel Lofty Dillingham, DO;  Location: [place];  Service: Plastics;  Laterality: Bilateral;  . BREAST RECONSTRUCTION WITH PLACEMENT OF TISSUE EXPANDER AND FLEX HD (ACELLULAR HYDRATED DERMIS) Bilateral 05/29/2016   Procedure: PLACEMENT OF BILATERAL TISSUE EXPANDER AND FLEX HD (ACELLULAR HYDRATED DERMIS);  Surgeon: Wallace Going, DO;  Location: [place];  Service: Plastics;  Laterality: Bilateral;  . BREAST REDUCTION SURGERY Bilateral 11/26/2016   Procedure: BILATERAL BREAST CAPSULE CONTRACTURE RELASE;  Surgeon: Wallace Going, DO;  Location: [place];  Service: Plastics;  Laterality: Bilateral;  . [place]; 1989; 1991  . ELBOW SURGERY Right x3   last one 02-01-2019 @ [facility]  . FAT GRAFTING BILATERAL BREAST  08-09-2018  @[facility]   . INCISION AND DRAINAGE OF WOUND Bilateral 02/11/2016   Procedure: IRRIGATION AND DEBRIDEMENT OF BILATERAL BREAST POCKET;  Surgeon: Wallace Going, DO;  Location: [place];  Service: Plastics;  Laterality: Bilateral;  . LAPAROSCOPIC APPENDECTOMY  04-07-2011   @[facility]    w/ Excision peritoneal lipoma and lysis adhesions  . LAPAROSCOPIC CHOLECYSTECTOMY  ~ 1999  . LIPOSUCTION WITH LIPOFILLING Bilateral 11/26/2016   Procedure: LIPOFILLING FOR SYMMETRY;  Surgeon: Wallace Going, DO;  Location: [place];  Service: Plastics;  Laterality: Bilateral;  . LIPOSUCTION WITH LIPOFILLING Bilateral 01/21/2017   Procedure: BILATERAL BREAST  LIPOFILLING FOR ASYMMETRY;  Surgeon: Wallace Going, DO;  Location: [place];  Service: Plastics;  Laterality:  Bilateral;  . MASTECTOMY Bilateral 01/09/2016  . NIPPLE SPARING MASTECTOMY/SENTINAL LYMPH NODE BIOPSY/RECONSTRUCTION/PLACEMENT OF TISSUE EXPANDER Bilateral 01/09/2016   Procedure: BILATERAL NIPPLE SPARING MASTECTOMY WITH LEFT SENTINAL LYMPH NODE BIOPSY ;  Surgeon: Stark Klein, MD;  Location: [place];   Service: General;  Laterality: Bilateral;  . REMOVAL OF BILATERAL TISSUE EXPANDERS WITH PLACEMENT OF BILATERAL BREAST IMPLANTS Bilateral 08/20/2016   Procedure: REMOVAL OF BILATERAL TISSUE EXPANDERS WITH PLACEMENT OF BILATERAL SILICONE IMPLANTS;  Surgeon: Wallace Going, DO;  Location: [place];  Service: Plastics;  Laterality: Bilateral;  . REMOVAL OF BILATERAL TISSUE EXPANDERS WITH PLACEMENT OF BILATERAL BREAST IMPLANTS Bilateral 11/05/2017   Procedure: REMOVAL OF BILATERAL TISSUE EXPANDERS WITH PLACEMENT OF BILATERAL BREAST SILICONE IMPLANTS;  Surgeon: Wallace Going, DO;  Location: [place];  Service: Plastics;  Laterality: Bilateral;  . REMOVAL OF TISSUE EXPANDER Bilateral 02/11/2016   Procedure: REMOVAL OF BILATERAL TISSUE EXPANDERS AND FLEX HD REMOVAL;  Surgeon: Wallace Going, DO;  Location: [place];  Service: Plastics;  Laterality: Bilateral;  . TISSUE EXPANDER PLACEMENT Bilateral 08/12/2017   Procedure: PLACEMENT OF BILATERAL TISSUE EXPANDER;  Surgeon: Wallace Going, DO;  Location: [place];  Service: Plastics;  Laterality: Bilateral;  . TUBAL LIGATION Bilateral 1991   Social History   Socioeconomic History  . Marital status: Single    Spouse name: Not on file  . Number of children: Not on file  . Years of education: Not on file  . Highest education level: Not on file  Occupational History  . Not on file  Tobacco Use  . Smoking status: Former Smoker    Packs/day: 1.00    Years: 10.00    Pack years: 10.00    Types: Cigarettes    Quit date: 06/08/2008    Years since quitting: 11.5  . Smokeless tobacco: Never Used  Substance and Sexual Activity  . Alcohol use: No  . Drug use: Never  . Sexual activity: Never    Birth control/protection: Surgical    Comment: hysterectomy  Other Topics Concern  . Not on file  Social History Narrative  . Not on file   Social Determinants of Health    Financial Resource Strain:   . Difficulty of Paying Living Expenses:   Food Insecurity:   . Worried About Charity fundraiser in the Last Year:   . Arboriculturist in the Last Year:   Transportation Needs:   . Film/video editor (Medical):   Marland Kitchen Lack of Transportation (Non-Medical):   Physical Activity:   . Days of Exercise per Week:   . Minutes of Exercise per Session:   Stress:   . Feeling of Stress :   Social Connections:   . Frequency of Communication with Friends and Family:   . Frequency of Social Gatherings with Friends and Family:   . Attends Religious Services:   . Active Member of Clubs or Organizations:   . Attends Archivist Meetings:   Marland Kitchen Marital Status:    Family History  Problem Relation Age of Onset  . Heart failure Father   . Prostate cancer Father 68  . Colon polyps Mother        approx 2  . Other Mother        hx HPV and hysterectomy due to precancerous cells  . Other Sister 58       hx of hysterectomy for unspecified reason; still has ovaries  . Other  Sister 38       paternal half-sister hx of hysterectomy for unspecified reason; still has ovaries  . Bladder Cancer Maternal Uncle 79       not a smoker  . Kidney failure Maternal Grandmother   . Congestive Heart Failure Maternal Grandmother   . Colon cancer Maternal Grandmother 39  . Lung cancer Maternal Grandfather 15       smoker  . Breast cancer Paternal Grandmother        dx. early 22s; w/ hx of trauma to breast  . Crohn's disease Daughter   . Lung cancer Maternal Uncle 37       smoker   No Known Allergies Prior to Admission medications   Medication Sig Start Date End Date Taking? Authorizing Provider  amitriptyline (ELAVIL) 100 MG tablet Take 100 mg by mouth at bedtime.   Yes [provider]  citalopram (CELEXA) 20 MG tablet Take 20 mg by mouth daily.   Yes [provider]  cycloSPORINE (RESTASIS) 0.05 % ophthalmic emulsion Place 1 drop into both eyes 2 (two)  times daily.   Yes [provider]  tamoxifen (NOLVADEX) 20 MG tablet Take 1 tablet (20 mg total) by mouth daily. 01/13/19  Yes Nicholas Lose, MD     Positive ROS: All other systems have been reviewed and were otherwise negative with the exception of those mentioned in the HPI and as above.  Physical Exam: General: Alert, no acute distress Cardiovascular: No pedal edema Respiratory: No cyanosis, no use of accessory musculature GI: No organomegaly, abdomen is soft and non-tender Skin: No lesions in the area of chief complaint Neurologic: Sensation intact distally Psychiatric: Patient is competent for consent with normal mood and affect Lymphatic: No axillary or cervical lymphadenopathy  MUSCULOSKELETAL: Right knee has 0 to 120 degrees of motion with pain to palpation along the medial joint line.  Ligamentously intact.  Mild patellar crepitus.  Assessment: Right knee pain with medial meniscus tear, with some early chondral changes in multiple compartments.   Plan: Plan for Procedure(s): KNEE ARTHROSCOPY WITH MEDIAL MENISECTOMY and chondroplasty  The risks benefits and alternatives were discussed with the patient including but not limited to the risks of nonoperative treatment, versus surgical intervention including infection, bleeding, nerve injury,  blood clots, cardiopulmonary complications, morbidity, mortality, among others, and they were willing to proceed.   We also discussed the limitations of arthroscopy, particularly with the presentation of degenerative changes, but her knees do not look bad enough for arthroplasty.   Johnny Bridge, MD Cell [phone]   12/20/2019 10:57 AM

## 2020-03-21 ENCOUNTER — Other Ambulatory Visit: Payer: Self-pay | Admitting: Family Medicine

## 2020-03-21 DIAGNOSIS — R319 Hematuria, unspecified: Secondary | ICD-10-CM

## 2020-03-21 DIAGNOSIS — M549 Dorsalgia, unspecified: Secondary | ICD-10-CM

## 2020-03-21 DIAGNOSIS — R300 Dysuria: Secondary | ICD-10-CM

## 2020-04-04 ENCOUNTER — Ambulatory Visit
Admission: RE | Admit: 2020-04-04 | Discharge: 2020-04-04 | Disposition: A | Discharge status: Home / Self Care | Payer: 59 | Source: Ambulatory Visit | Attending: Family Medicine | Admitting: Family Medicine

## 2020-04-04 DIAGNOSIS — R300 Dysuria: Secondary | ICD-10-CM

## 2020-04-04 DIAGNOSIS — M549 Dorsalgia, unspecified: Secondary | ICD-10-CM

## 2020-04-04 DIAGNOSIS — R319 Hematuria, unspecified: Secondary | ICD-10-CM

## 2020-04-24 ENCOUNTER — Other Ambulatory Visit: Payer: Self-pay

## 2020-04-24 ENCOUNTER — Encounter: Payer: Self-pay | Admitting: Plastic Surgery

## 2020-04-24 ENCOUNTER — Ambulatory Visit (INDEPENDENT_AMBULATORY_CARE_PROVIDER_SITE_OTHER): Payer: 59 | Admitting: Plastic Surgery

## 2020-04-24 DIAGNOSIS — Z9013 Acquired absence of bilateral breasts and nipples: Secondary | ICD-10-CM | POA: Diagnosis not present

## 2020-04-24 DIAGNOSIS — N651 Disproportion of reconstructed breast: Secondary | ICD-10-CM | POA: Insufficient documentation

## 2020-04-24 DIAGNOSIS — Z9889 Other specified postprocedural states: Secondary | ICD-10-CM

## 2020-04-24 DIAGNOSIS — Z9010 Acquired absence of unspecified breast and nipple: Secondary | ICD-10-CM | POA: Insufficient documentation

## 2020-04-24 NOTE — Progress Notes (Signed)
Patient ID: Christina [name], female    DOB: 1966/02/12, 54 y.o.   MRN: [id number]   Chief Complaint  Patient presents with  . Advice Only  . Breast Problem    The patient is a 54 year old white female here for evaluation of her breast.  She underwent bilateral nipple sparing mastectomies in 2017 with reconstruction for LEFT breast cancer DCIS (ER/PR positive).  She has [place] ultra high profile 993 cc silicone implants in place.  This was done in February 2019.  She was 189 pounds at the time.  She is 5 feet 7 inches tall and weighs 222 pounds.  She does not smoke (quite over 10 years ago) and does not have diabetes.  She is currently taking care of 2 grandkids ages 74 and 4.  She has not had any evaluation of her breasts I order ultrasound.  On exam her breasts are soft.  There is no sign of seroma.  All incisions are healed nicely.  She has a little bit of change due to her weight increase.  There is some asymmetry with some denting mostly prominent when she stands up.  She is willing to seek discussions about weight reduction.  No Contractions palpated.   Review of Systems  Constitutional: Positive for activity change. Negative for appetite change.  Eyes: Negative.   Respiratory: Negative.  Negative for chest tightness and shortness of breath.   Cardiovascular: Negative for leg swelling.  Gastrointestinal: Negative for abdominal distention and abdominal pain.  Endocrine: Negative.   Genitourinary: Negative.   Musculoskeletal: Negative.   Neurological: Negative.   Hematological: Negative.   Psychiatric/Behavioral: Negative.     Past Medical History:  Diagnosis Date  . Arthritis    hands and knees  . 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 Depression   . GAD (generalized anxiety disorder)   . History of endometriosis   .  History of ovarian cyst   . PONV (postoperative nausea and vomiting)    does well with scop patch  . Pre-diabetes    followed by pcp  . Right knee meniscal tear   . Urgency of urination   . Wears dentures    upper  . Wears glasses     Past Surgical History:  Procedure Laterality Date  . ABDOMINAL HYSTERECTOMY  05/1996   endometriosis  . ACHILLES TENDON REPAIR Right 2010;  revision 2011  . BLADDER SUSPENSION  2000  . BREAST BIOPSY Left 10/2015  . BREAST IMPLANT REMOVAL Bilateral 08/12/2017   Procedure: REMOVAL BILATERAL BREAST IMPLANTS;  Surgeon: Wallace Going, DO;  Location: [place];  Service: Plastics;  Laterality: Bilateral;  . BREAST RECONSTRUCTION WITH PLACEMENT OF TISSUE EXPANDER AND FLEX HD (ACELLULAR HYDRATED DERMIS) Bilateral 01/09/2016  . BREAST RECONSTRUCTION WITH PLACEMENT OF TISSUE EXPANDER AND FLEX HD (ACELLULAR HYDRATED DERMIS) Bilateral 01/09/2016   Procedure: BREAST RECONSTRUCTION WITH PLACEMENT OF TISSUE EXPANDER AND FLEX HD (ACELLULAR HYDRATED DERMIS);  Surgeon: Loel Lofty Korryn Pancoast, DO;  Location: [place];  Service: Plastics;  Laterality: Bilateral;  . BREAST RECONSTRUCTION WITH PLACEMENT OF TISSUE EXPANDER AND FLEX HD (ACELLULAR HYDRATED DERMIS) Bilateral 05/29/2016   Procedure: PLACEMENT OF BILATERAL TISSUE EXPANDER AND FLEX HD (ACELLULAR HYDRATED DERMIS);  Surgeon: Wallace Going, DO;  Location: [place];  Service: Plastics;  Laterality: Bilateral;  .  BREAST REDUCTION SURGERY Bilateral 11/26/2016   Procedure: BILATERAL BREAST CAPSULE CONTRACTURE RELASE;  Surgeon: Wallace Going, DO;  Location: [place];  Service: Plastics;  Laterality: Bilateral;  . [place]; 1989; 1991  . ELBOW SURGERY Right x3   last one 02-01-2019 @ [facility]  . FAT GRAFTING BILATERAL BREAST  08-09-2018  @[facility]   . INCISION AND DRAINAGE OF WOUND Bilateral 02/11/2016   Procedure: IRRIGATION AND DEBRIDEMENT OF BILATERAL BREAST  POCKET;  Surgeon: Wallace Going, DO;  Location: [place];  Service: Plastics;  Laterality: Bilateral;  . KNEE ARTHROSCOPY WITH MEDIAL MENISECTOMY Right 12/20/2019   Procedure: KNEE ARTHROSCOPY WITH MEDIAL MENISECTOMY;  Surgeon: Marchia Bond, MD;  Location: [place];  Service: Orthopedics;  Laterality: Right;  . LAPAROSCOPIC APPENDECTOMY  04-07-2011   @[facility]    w/ Excision peritoneal lipoma and lysis adhesions  . LAPAROSCOPIC CHOLECYSTECTOMY  ~ 1999  . LIPOSUCTION WITH LIPOFILLING Bilateral 11/26/2016   Procedure: LIPOFILLING FOR SYMMETRY;  Surgeon: Wallace Going, DO;  Location: [place];  Service: Plastics;  Laterality: Bilateral;  . LIPOSUCTION WITH LIPOFILLING Bilateral 01/21/2017   Procedure: BILATERAL BREAST  LIPOFILLING FOR ASYMMETRY;  Surgeon: Wallace Going, DO;  Location: [place];  Service: Plastics;  Laterality: Bilateral;  . MASTECTOMY Bilateral 01/09/2016  . NIPPLE SPARING MASTECTOMY/SENTINAL LYMPH NODE BIOPSY/RECONSTRUCTION/PLACEMENT OF TISSUE EXPANDER Bilateral 01/09/2016   Procedure: BILATERAL NIPPLE SPARING MASTECTOMY WITH LEFT SENTINAL LYMPH NODE BIOPSY ;  Surgeon: Stark Klein, MD;  Location: [place];  Service: General;  Laterality: Bilateral;  . REMOVAL OF BILATERAL TISSUE EXPANDERS WITH PLACEMENT OF BILATERAL BREAST IMPLANTS Bilateral 08/20/2016   Procedure: REMOVAL OF BILATERAL TISSUE EXPANDERS WITH PLACEMENT OF BILATERAL SILICONE IMPLANTS;  Surgeon: Wallace Going, DO;  Location: [place];  Service: Plastics;  Laterality: Bilateral;  . REMOVAL OF BILATERAL TISSUE EXPANDERS WITH PLACEMENT OF BILATERAL BREAST IMPLANTS Bilateral 11/05/2017   Procedure: REMOVAL OF BILATERAL TISSUE EXPANDERS WITH PLACEMENT OF BILATERAL BREAST SILICONE IMPLANTS;  Surgeon: Wallace Going, DO;  Location: [place];  Service: Plastics;  Laterality: Bilateral;  . REMOVAL OF TISSUE  EXPANDER Bilateral 02/11/2016   Procedure: REMOVAL OF BILATERAL TISSUE EXPANDERS AND FLEX HD REMOVAL;  Surgeon: Wallace Going, DO;  Location: [place];  Service: Plastics;  Laterality: Bilateral;  . TISSUE EXPANDER PLACEMENT Bilateral 08/12/2017   Procedure: PLACEMENT OF BILATERAL TISSUE EXPANDER;  Surgeon: Wallace Going, DO;  Location: [place];  Service: Plastics;  Laterality: Bilateral;  . TUBAL LIGATION Bilateral 1991      Current Outpatient Medications:  .  amitriptyline (ELAVIL) 100 MG tablet, Take 100 mg by mouth at bedtime., Disp: , Rfl:  .  citalopram (CELEXA) 20 MG tablet, Take 20 mg by mouth daily., Disp: , Rfl:  .  cycloSPORINE (RESTASIS) 0.05 % ophthalmic emulsion, Place 1 drop into both eyes 2 (two) times daily., Disp: , Rfl:  .  tamoxifen (NOLVADEX) 20 MG tablet, Take 1 tablet (20 mg total) by mouth daily., Disp: 90 tablet, Rfl: 3 .  baclofen (LIORESAL) 10 MG tablet, Take 1 tablet (10 mg total) by mouth 3 (three) times daily. As needed for muscle spasm (Patient not taking: Reported on 04/24/2020), Disp: 50 tablet, Rfl: 0 .  HYDROcodone-acetaminophen (NORCO) 10-325 MG tablet, Take 1 tablet by mouth every 6 (six) hours as needed. (Patient not taking: Reported on 04/24/2020), Disp: 28 tablet, Rfl: 0 .  ondansetron (ZOFRAN-ODT) 4  MG disintegrating tablet, Take 1 tablet (4 mg total) by mouth every 8 (eight) hours as needed for nausea or vomiting. (Patient not taking: Reported on 04/24/2020), Disp: 20 tablet, Rfl: 0 .  sennosides-docusate sodium (SENOKOT-S) 8.6-50 MG tablet, Take 2 tablets by mouth daily. When taking pain pills. (Patient not taking: Reported on 04/24/2020), Disp: 30 tablet, Rfl: 1   Objective:   Vitals:   04/24/20 0848  BP: (!) 162/85  Pulse: 80  Temp: 98.1 F (36.7 C)  SpO2: 98%    Physical Exam Vitals and nursing note reviewed.  Constitutional:      Appearance: Normal appearance.  HENT:     Head: Normocephalic and  atraumatic.  Cardiovascular:     Rate and Rhythm: Normal rate.     Pulses: Normal pulses.  Pulmonary:     Effort: Pulmonary effort is normal. No respiratory distress.  Abdominal:     General: Abdomen is flat. There is no distension.     Palpations: There is no mass.     Tenderness: There is no abdominal tenderness.  Skin:    General: Skin is warm.     Capillary Refill: Capillary refill takes less than 2 seconds.  Neurological:     General: No focal deficit present.     Mental Status: She is alert.  Psychiatric:        Mood and Affect: Mood normal.        Behavior: Behavior normal.     Assessment & Plan:  Breast asymmetry following reconstructive surgery - Plan: US BREAST COMPLETE UNI RIGHT INC AXILLA, US BREAST COMPLETE UNI LEFT INC AXILLA  Acquired absence of both breasts - Plan: US BREAST COMPLETE UNI RIGHT INC AXILLA, US BREAST COMPLETE UNI LEFT INC AXILLA  S/P breast reconstruction, bilateral  I think the patient would do well with bilateral lipofilling we will also referred her to the [facility] to be sure that the implants are intact before we do additional surgery. We will plan lipophilic bilateral breasts.  Pictures were obtained of the patient and placed in the chart with the patient's or guardian's permission.   [place], DO

## 2020-05-10 ENCOUNTER — Ambulatory Visit
Admission: RE | Admit: 2020-05-10 | Discharge: 2020-05-10 | Disposition: A | Discharge status: Home / Self Care | Payer: 59 | Source: Ambulatory Visit | Attending: Plastic Surgery | Admitting: Plastic Surgery

## 2020-05-10 ENCOUNTER — Other Ambulatory Visit: Payer: Self-pay | Admitting: Plastic Surgery

## 2020-05-10 ENCOUNTER — Other Ambulatory Visit: Payer: Self-pay

## 2020-05-10 DIAGNOSIS — N651 Disproportion of reconstructed breast: Secondary | ICD-10-CM

## 2020-05-10 DIAGNOSIS — Z9013 Acquired absence of bilateral breasts and nipples: Secondary | ICD-10-CM

## 2020-05-15 ENCOUNTER — Other Ambulatory Visit: Payer: Self-pay

## 2020-05-15 ENCOUNTER — Ambulatory Visit (INDEPENDENT_AMBULATORY_CARE_PROVIDER_SITE_OTHER): Payer: 59 | Admitting: Family Medicine

## 2020-05-15 ENCOUNTER — Encounter (INDEPENDENT_AMBULATORY_CARE_PROVIDER_SITE_OTHER): Payer: Self-pay | Admitting: Family Medicine

## 2020-05-15 VITALS — BP 112/71 | HR 67 | Temp 98.0°F | Ht 68.0 in | Wt 218.0 lb

## 2020-05-15 DIAGNOSIS — R7303 Prediabetes: Secondary | ICD-10-CM

## 2020-05-15 DIAGNOSIS — C50112 Malignant neoplasm of central portion of left female breast: Secondary | ICD-10-CM

## 2020-05-15 DIAGNOSIS — E119 Type 2 diabetes mellitus without complications: Secondary | ICD-10-CM | POA: Insufficient documentation

## 2020-05-15 DIAGNOSIS — Z0289 Encounter for other administrative examinations: Secondary | ICD-10-CM

## 2020-05-15 DIAGNOSIS — K5900 Constipation, unspecified: Secondary | ICD-10-CM

## 2020-05-15 DIAGNOSIS — R0602 Shortness of breath: Secondary | ICD-10-CM | POA: Diagnosis not present

## 2020-05-15 DIAGNOSIS — F3289 Other specified depressive episodes: Secondary | ICD-10-CM

## 2020-05-15 DIAGNOSIS — R5383 Other fatigue: Secondary | ICD-10-CM | POA: Diagnosis not present

## 2020-05-15 DIAGNOSIS — Z9189 Other specified personal risk factors, not elsewhere classified: Secondary | ICD-10-CM

## 2020-05-15 DIAGNOSIS — Z6833 Body mass index (BMI) 33.0-33.9, adult: Secondary | ICD-10-CM

## 2020-05-15 DIAGNOSIS — F339 Major depressive disorder, recurrent, unspecified: Secondary | ICD-10-CM | POA: Insufficient documentation

## 2020-05-15 DIAGNOSIS — E669 Obesity, unspecified: Secondary | ICD-10-CM

## 2020-05-15 DIAGNOSIS — Z6379 Other stressful life events affecting family and household: Secondary | ICD-10-CM | POA: Insufficient documentation

## 2020-05-15 DIAGNOSIS — D649 Anemia, unspecified: Secondary | ICD-10-CM

## 2020-05-15 DIAGNOSIS — E66811 Obesity, class 1: Secondary | ICD-10-CM

## 2020-05-15 DIAGNOSIS — Z170 Estrogen receptor positive status [ER+]: Secondary | ICD-10-CM
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
## 2020-05-15 NOTE — Progress Notes (Signed)
Dear Dr. Marla Roe,   Thank you for referring Christina [name] to our clinic. The following note includes my evaluation and treatment recommendations.  Chief Complaint:   OBESITY Christina [name] (MR# [id number]) is a 54 y.o. female who presents for evaluation and treatment of obesity and related comorbidities. Current BMI is Body mass index is 33.15 kg/m. Christina [name] has been struggling with her weight for many years and has been unsuccessful in either losing weight, maintaining weight loss, or reaching her healthy weight goal.  Christina [name] is currently in the action stage of change and ready to dedicate time achieving and maintaining a healthier weight. Christina [name] is interested in becoming our patient and working on intensive lifestyle modifications including (but not limited to) diet and exercise for weight loss.  Christina [name] says she goes through the [employer] drive-thru for breakfast every morning and skips lunch and goes through a drive-thru again on her way home as well due to increased hunger.  She also usually eats take-out for dinner as well.  Christina [name]'s habits were reviewed today and are as follows: Her family eats meals together, her desired weight loss is 85 pounds, she started gaining weight over the last few months, her heaviest weight ever was 225 pounds, she is a picky eater and doesn't like to eat healthier foods, she craves carbs, she snacks frequently in the evenings, she wakes up frequently in the middle of the night to eat, she skips lunch frequently, she frequently makes poor food choices, she frequently eats larger portions than normal and she struggles with emotional eating.  Depression Screen Christina [name]'s Food and Mood (modified PHQ-9) score was 20.  Depression screen Christina [name] 2/9 05/15/2020  Decreased Interest 3  Down, Depressed, Hopeless 3  PHQ - 2 Score 6  Altered sleeping 1  Tired, decreased energy 3  Change in appetite 3  Feeling bad or failure about yourself  3  Trouble  concentrating 3  Moving slowly or fidgety/restless 1  Suicidal thoughts 0  PHQ-9 Score 20   Subjective:   1. Other fatigue Christina [name] admits to daytime somnolence and reports waking up still tired. Patent has a history of symptoms of daytime fatigue, morning fatigue, morning headache and snoring. Christina [name] generally gets 6 or 7 hours of sleep per night, and states that she has generally restful sleep. Snoring is present. Apneic episodes are present. Epworth Sleepiness Score is 20.  2. Shortness of breath on exertion Christina [name] notes increasing shortness of breath with exercising and seems to be worsening over time with weight gain. She notes getting out of breath sooner with activity than she used to. This has gotten worse recently. Christina [name] denies shortness of breath at rest or orthopnea.  3. Prediabetes Christina [name] has a diagnosis of prediabetes based on her elevated HgA1c and was informed this puts her at greater risk of developing diabetes. She continues to work on diet and exercise to decrease her risk of diabetes. She denies nausea or hypoglycemia.  She was diagnosed by her PCP a year or so ago.  4. Malignant neoplasm of central portion of left breast in female, estrogen receptor positive, PR positive (Christina [name]) Christina [name] was first diagnosed in 2017.  Double mastectomy in 01/2016.  Implants in 2019.    5. Constipation, unspecified constipation type Prescribed Linzess in the past, but not needing it now/recently.  6. Anemia, unspecified type History of iron deficiency anemia.  She is supposed to take a supplement, but she does not.  She says it has been like  that for 20+ years or so.  She says she was told it is due to dietary nutrition.  7. Other depression, with emotional eating She has been on Celexa since January 2021.  Denies SI/HI or concerns.  Effexor since 2018, but due to insurance issues, she was changed to Christina [name] in January 2021.  8. At risk for dehydration Christina [name] is at increased risk for  dehydration.  Please see more information in the plan.  Assessment/Plan:   1. Other fatigue Christina [name] does feel that her weight is causing her energy to be lower than it should be. Fatigue may be related to obesity, depression or many other causes. Labs will be ordered, and in the meanwhile, Christina [name] will focus on self care including making healthy food choices, increasing physical activity and focusing on stress reduction.  IC, ECG, and check labs today.  - EKG 12-Lead - CBC with Differential/Platelet - Comprehensive metabolic panel - Lipid Panel With LDL/HDL Ratio - T3 - T4, free - TSH - VITAMIN D 25 Hydroxy (Vit-D Deficiency, Fractures) - Folate - Hemoglobin A1c - Insulin, random  2. Shortness of breath on exertion Christina [name] does feel that she gets out of breath more easily that she used to when she exercises. [name]'s shortness of breath appears to be obesity related and exercise induced. She has agreed to work on weight loss and gradually increase exercise to treat her exercise induced shortness of breath. Will continue to monitor closely.  IC, ECG, and check labs today.  - Lipid Panel With LDL/HDL Ratio  3. Prediabetes Will check labs today.  - Comprehensive metabolic panel - Hemoglobin A1c - Insulin, random  4. Malignant neoplasm of central portion of left breast in female, estrogen receptor positive (Christina [name]) Check labs today.  - CBC with Differential/Platelet - Comprehensive metabolic panel - Lipid Panel With LDL/HDL Ratio - T3 - T4, free - TSH - VITAMIN D 25 Hydroxy (Vit-D Deficiency, Fractures) - Folate - Hemoglobin A1c - Insulin, random  5. Constipation, unspecified constipation type Hydration of 1/2 her weight in ounces of water per day and MiraLAX as needed with Linzess as needed. Will check labs today.  6. Anemia, unspecified type Orders and follow up as documented in patient record.  Counseling . Iron is essential for our bodies to make red blood cells.   Reasons that someone may be deficient include: an iron-deficient diet (more likely in those following vegan or vegetarian diets), women with heavy menses, patients with GI disorders or poor absorption, patients that have had bariatric surgery, frequent blood donors, patients with cancer, and patients with heart disease.   Christina [name] foods include dark leafy greens, red and white meats, eggs, seafood, and beans.   . Certain foods and drinks prevent your body from absorbing iron properly. Avoid eating these foods in the same meal as iron-rich foods or with iron supplements. These foods include: coffee, black tea, and red wine; milk, dairy products, and foods that are high in calcium; beans and soybeans; whole grains.  . Constipation can be a side effect of iron supplementation. Increased water and fiber intake are helpful. Water goal: > 2 liters/day. Fiber goal: > 25 grams/day.  - Vitamin B12 - CBC with Differential/Platelet - Comprehensive metabolic panel - Folate  7. Other depression, with emotional eating Patient was referred to Dr. Mallie Mussel, our Bariatric Psychologist, for evaluation due to her elevated PHQ-9 score and significant struggles with emotional eating.  Continue Celexa.  Will check labs today.  Add meditation, prayer, some walking,  hygiene, and sleep.  - CBC with Differential/Platelet - T3 - T4, free - TSH - VITAMIN D 25 Hydroxy (Vit-D Deficiency, Fractures)  8. At risk for dehydration Christina [name] is at higher than average risk of dehydration.  Christina [name] was given more than 12-15 minutes of proper hydration counseling today.   We discussed the signs and symptoms of dehydration some of which may include muscle cramping, constipation or even orthostatic symptoms.   Counseling on the prevention of dehydration was also provided today.    Christina [name] is at risk for dehydration due to weight loss, lifestyle and behavorial habits and possibly due to taking certain medication(s).  She was encouraged to  adequately hydrate and monitor fluid status to avoid dehydration as well as weight loss plateaus.  Unless pre-existing renal or cardiopulmonary conditions exist which pt was told to limit their fluid intake, I recommended roughly one half of their weight in pounds to be the approximate ounces of non-caloric, non-caffeinated beverages they should drink per day; including more if they are engaging in exercise.  9. Class 1 obesity with serious comorbidity and body mass index (BMI) of 33.0 to 33.9 in adult, unspecified obesity type Christina [name] is currently in the action stage of change and her goal is to continue with weight loss efforts. I recommend Christina [name] begin the structured treatment plan as follows:  She has agreed to the Category 2 Plan.  Exercise goals: All adults should avoid inactivity. Some physical activity is better than none, and adults who participate in any amount of physical activity gain some health benefits.   Behavioral modification strategies: increasing lean protein intake, increasing water intake, decreasing eating out, emotional eating strategies, avoiding temptations and planning for success.   She was informed of the importance of frequent follow-up visits to maximize her success with intensive lifestyle modifications for her multiple health conditions. She was informed we would discuss her lab results at her next visit unless there is a critical issue that needs to be addressed sooner. Christina [name] agreed to keep her next visit at the agreed upon time to discuss these results.  Objective:   Blood pressure 112/71, pulse 67, temperature 98 F (36.7 C), height 5\' 8"  (1.727 m), weight 218 lb (98.9 kg), SpO2 97 %. Body mass index is 33.15 kg/m.  EKG: Normal sinus rhythm, rate 72 bpm.  Indirect Calorimeter completed today shows a VO2 of 323 and a REE of 2250.  Her calculated basal metabolic rate is 9379 thus her basal metabolic rate is better than expected.  General: Cooperative, alert,  well developed, in no acute distress. HEENT: Conjunctivae and lids unremarkable. Cardiovascular: Regular rhythm.  Lungs: Normal work of breathing. Neurologic: No focal deficits.   Lab Results  Component Value Date   CREATININE 0.50 12/20/2019   BUN 17 12/20/2019   NA 139 12/20/2019   K 4.1 12/20/2019   CL 107 12/20/2019   CO2 22 01/27/2016   Lab Results  Component Value Date   ALT 18 01/27/2016   AST 16 01/27/2016   ALKPHOS 54 01/27/2016   BILITOT 0.4 01/27/2016   Lab Results  Component Value Date   WBC 14.2 (H) 01/27/2016   HGB 13.6 12/20/2019   HCT 40.0 12/20/2019   MCV 86.9 01/27/2016   PLT 376 01/27/2016   Attestation Statements:   Reviewed by clinician on day of visit: allergies, medications, problem list, medical history, surgical history, family history, social history, and previous encounter notes.  This is the patient's first visit at [facility].  The patient's NEW PATIENT PACKET that they filled out prior to today's office visit was reviewed at length and some information from that paperwork was also included within the following office visit note.  Included in the packet: current and past health history, medications, allergies, ROS, gynecologic history (women only), surgical history, family history, social history, weight history, weight loss surgery history (for those that have had weight loss surgery), nutritional evaluation, mood and food questionnaire along with a depression screening (PHQ9) on all patients, an Epworth questionnaire, sleep habits questionnaire, patient life and health improvement goals questionnaire. These will all be scanned into the patient's chart under media.   During the visit, I independently reviewed the patient's EKG, bioimpedance scale results, and indirect calorimeter results. I used this information to tailor a meal plan for the patient that will help Eddye to lose weight and will improve her obesity-related conditions  going forward. I performed a medically necessary appropriate examination and/or evaluation. I discussed the assessment and treatment plan with the patient. The patient was provided an opportunity to ask questions and all were answered. The patient agreed with the plan and demonstrated an understanding of the instructions. Labs were ordered at this visit and will be reviewed at the next visit unless more critical results need to be addressed immediately. Clinical information was updated and documented in the EMR.  Time spent on visit including pre-visit chart review and post-visit care was estimated to be 60 minutes.  A separate 15 minutes was spent on risk counseling (see above).   I, Water quality scientist, CMA, am acting as Location manager for [employer], DO.  I have reviewed the above documentation for accuracy and completeness, and I agree with the above. Mellody Dance, DO

## 2020-05-16 LAB — CBC WITH DIFFERENTIAL/PLATELET
Basophils Absolute: 0 10*3/uL (ref 0.0–0.2)
Basos: 0 %
EOS (ABSOLUTE): 0.1 10*3/uL (ref 0.0–0.4)
Eos: 2 %
Hematocrit: 42 % (ref 34.0–46.6)
Hemoglobin: 13.8 g/dL (ref 11.1–15.9)
Immature Grans (Abs): 0 10*3/uL (ref 0.0–0.1)
Immature Granulocytes: 0 %
Lymphocytes Absolute: 1.9 10*3/uL (ref 0.7–3.1)
Lymphs: 28 %
MCH: 28.5 pg (ref 26.6–33.0)
MCHC: 32.9 g/dL (ref 31.5–35.7)
MCV: 87 fL (ref 79–97)
Monocytes Absolute: 0.5 10*3/uL (ref 0.1–0.9)
Monocytes: 8 %
Neutrophils Absolute: 4.2 10*3/uL (ref 1.4–7.0)
Neutrophils: 62 %
Platelets: 308 10*3/uL (ref 150–450)
RBC: 4.85 x10E6/uL (ref 3.77–5.28)
RDW: 13.1 % (ref 11.7–15.4)
WBC: 6.8 10*3/uL (ref 3.4–10.8)

## 2020-05-16 LAB — COMPREHENSIVE METABOLIC PANEL
ALT: 34 IU/L — ABNORMAL HIGH (ref 0–32)
AST: 23 IU/L (ref 0–40)
Albumin/Globulin Ratio: 1.6 (ref 1.2–2.2)
Albumin: 4.2 g/dL (ref 3.8–4.9)
Alkaline Phosphatase: 52 IU/L (ref 48–121)
BUN/Creatinine Ratio: 14 (ref 9–23)
BUN: 10 mg/dL (ref 6–24)
Bilirubin Total: 0.3 mg/dL (ref 0.0–1.2)
CO2: 23 mmol/L (ref 20–29)
Calcium: 9.6 mg/dL (ref 8.7–10.2)
Chloride: 105 mmol/L (ref 96–106)
Creatinine, Ser: 0.71 mg/dL (ref 0.57–1.00)
GFR calc Af Amer: 113 mL/min/{1.73_m2} (ref >59)
GFR calc non Af Amer: 98 mL/min/{1.73_m2} (ref >59)
Globulin, Total: 2.6 g/dL (ref 1.5–4.5)
Glucose: 96 mg/dL (ref 65–99)
Potassium: 4.7 mmol/L (ref 3.5–5.2)
Sodium: 138 mmol/L (ref 134–144)
Total Protein: 6.8 g/dL (ref 6.0–8.5)

## 2020-05-16 LAB — VITAMIN B12: Vitamin B-12: 211 pg/mL — ABNORMAL LOW (ref 232–1245)

## 2020-05-16 LAB — HEMOGLOBIN A1C
Est. average glucose Bld gHb Est-mCnc: 134 mg/dL
Hgb A1c MFr Bld: 6.3 % — ABNORMAL HIGH (ref 4.8–5.6)

## 2020-05-16 LAB — LIPID PANEL WITH LDL/HDL RATIO
Cholesterol, Total: 125 mg/dL (ref 100–199)
HDL: 36 mg/dL — ABNORMAL LOW (ref >39)
LDL Chol Calc (NIH): 71 mg/dL (ref 0–99)
LDL/HDL Ratio: 2 ratio (ref 0.0–3.2)
Triglycerides: 95 mg/dL (ref 0–149)
VLDL Cholesterol Cal: 18 mg/dL (ref 5–40)

## 2020-05-16 LAB — T4, FREE: Free T4: 1.15 ng/dL (ref 0.82–1.77)

## 2020-05-16 LAB — T3: T3, Total: 129 ng/dL (ref 71–180)

## 2020-05-16 LAB — TSH: TSH: 1.73 u[IU]/mL (ref 0.450–4.500)

## 2020-05-16 LAB — FOLATE: Folate: 9.5 ng/mL (ref >3.0)

## 2020-05-16 LAB — VITAMIN D 25 HYDROXY (VIT D DEFICIENCY, FRACTURES): Vit D, 25-Hydroxy: 21.2 ng/mL — ABNORMAL LOW (ref 30.0–100.0)

## 2020-05-16 LAB — INSULIN, RANDOM: INSULIN: 34.7 u[IU]/mL — ABNORMAL HIGH (ref 2.6–24.9)

## 2020-05-21 ENCOUNTER — Telehealth (INDEPENDENT_AMBULATORY_CARE_PROVIDER_SITE_OTHER): Payer: 59 | Admitting: Psychology

## 2020-05-21 ENCOUNTER — Other Ambulatory Visit: Payer: Self-pay

## 2020-05-21 DIAGNOSIS — F3289 Other specified depressive episodes: Secondary | ICD-10-CM

## 2020-05-21 DIAGNOSIS — F419 Anxiety disorder, unspecified: Secondary | ICD-10-CM

## 2020-05-21 NOTE — Progress Notes (Signed)
Error

## 2020-05-26 ENCOUNTER — Ambulatory Visit
Admission: EM | Admit: 2020-05-26 | Discharge: 2020-05-26 | Disposition: A | Discharge status: Home / Self Care | Payer: 59 | Attending: Physician Assistant | Admitting: Physician Assistant

## 2020-05-26 ENCOUNTER — Other Ambulatory Visit: Payer: Self-pay

## 2020-05-26 DIAGNOSIS — R05 Cough: Secondary | ICD-10-CM | POA: Diagnosis not present

## 2020-05-26 DIAGNOSIS — R059 Cough, unspecified: Secondary | ICD-10-CM

## 2020-05-26 NOTE — ED Triage Notes (Addendum)
Patient presents for evaluation of dry cough for one week and loss of taste/smell that occurred yesterday. She is fully vaccinated for COVID.

## 2020-05-26 NOTE — ED Provider Notes (Signed)
[name]    CSN: [id number] Arrival date & time: 05/26/20  1117      History   Chief Complaint Chief Complaint  Patient presents with  . Cough  . loss smell/taste    HPI Christina [name] is a 54 y.o. female.   54 year old female comes in for COVID testing. Patient has had 1 week history of dry cough, yesterday started having loss of taste/smell, rhinorrhea, nasal congestion. Denies fever, chills, body aches. Denies abdominal pain, nausea, vomiting, diarrhea. Denies shortness of breath. Fully COVID vaccinated.      Past Medical History:  Diagnosis Date  . Anemia   . Arthritis    hands and knees  . 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 Constipation   . Depression   . GAD (generalized anxiety disorder)   . Gallbladder problem   . History of endometriosis   . History of ovarian cyst   . IBS (irritable bowel syndrome)   . Joint pain   . PONV (postoperative nausea and vomiting)    does well with scop patch  . Pre-diabetes    followed by pcp  . Right knee meniscal tear   . Swelling   . Urgency of urination   . Wears dentures    upper  . Wears glasses     Patient Active Problem List   Diagnosis Date Noted  . Stress due to illness of family member-  daughter with drug addiction 05/15/2020  . Depression, recurrent ([place]) 05/15/2020  . Anemia 05/15/2020  . Constipation 05/15/2020  . Prediabetes 05/15/2020  . Breast asymmetry following reconstructive surgery 04/24/2020  . Acquired absence of breast 04/24/2020  . S/P breast reconstruction, bilateral 04/24/2020  . Acute medial meniscus tear of left knee 12/20/2019  . Breast cancer, left ([place]) 01/09/2016  . Genetic testing 12/31/2015  . 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 Family history of breast cancer in female 11/08/2015  . Family  history of colon cancer 11/08/2015  . Malignant neoplasm of central portion of left breast in female, estrogen receptor positive ([place]) 10/31/2015  . Endometriosis 03/11/2011    Past Surgical History:  Procedure Laterality Date  . ABDOMINAL HYSTERECTOMY  05/1996   endometriosis  . ACHILLES TENDON REPAIR Right 2010;  revision 2011  . BLADDER SUSPENSION  2000  . BREAST BIOPSY Left 10/2015  . BREAST IMPLANT REMOVAL Bilateral 08/12/2017   Procedure: REMOVAL BILATERAL BREAST IMPLANTS;  Surgeon: Wallace Going, DO;  Location: [place];  Service: Plastics;  Laterality: Bilateral;  . BREAST RECONSTRUCTION WITH PLACEMENT OF TISSUE EXPANDER AND FLEX HD (ACELLULAR HYDRATED DERMIS) Bilateral 01/09/2016  . BREAST RECONSTRUCTION WITH PLACEMENT OF TISSUE EXPANDER AND FLEX HD (ACELLULAR HYDRATED DERMIS) Bilateral 01/09/2016   Procedure: BREAST RECONSTRUCTION WITH PLACEMENT OF TISSUE EXPANDER AND FLEX HD (ACELLULAR HYDRATED DERMIS);  Surgeon: Loel Lofty Dillingham, DO;  Location: [place];  Service: Plastics;  Laterality: Bilateral;  . BREAST RECONSTRUCTION WITH PLACEMENT OF TISSUE EXPANDER AND FLEX HD (ACELLULAR HYDRATED DERMIS) Bilateral 05/29/2016   Procedure: PLACEMENT OF BILATERAL TISSUE EXPANDER AND FLEX HD (ACELLULAR HYDRATED DERMIS);  Surgeon: Wallace Going, DO;  Location: [place];  Service: Plastics;  Laterality: Bilateral;  . BREAST REDUCTION SURGERY Bilateral 11/26/2016   Procedure: BILATERAL BREAST CAPSULE CONTRACTURE RELASE;  Surgeon: Loel Lofty Dillingham,  DO;  Location: [place];  Service: Plastics;  Laterality: Bilateral;  . [place]; 1989; 1991  . ELBOW SURGERY Right x3   last one 02-01-2019 @ [facility]  . FAT GRAFTING BILATERAL BREAST  08-09-2018  '@[facility]'   . INCISION AND DRAINAGE OF WOUND Bilateral 02/11/2016   Procedure: IRRIGATION AND DEBRIDEMENT OF BILATERAL BREAST POCKET;  Surgeon: Wallace Going, DO;  Location: [place];  Service: Plastics;  Laterality: Bilateral;  . KNEE ARTHROSCOPY WITH MEDIAL MENISECTOMY Right 12/20/2019   Procedure: KNEE ARTHROSCOPY WITH MEDIAL MENISECTOMY;  Surgeon: Marchia Bond, MD;  Location: [place];  Service: Orthopedics;  Laterality: Right;  . LAPAROSCOPIC APPENDECTOMY  04-07-2011   '@[facility]'    w/ Excision peritoneal lipoma and lysis adhesions  . LAPAROSCOPIC CHOLECYSTECTOMY  ~ 1999  . LIPOSUCTION WITH LIPOFILLING Bilateral 11/26/2016   Procedure: LIPOFILLING FOR SYMMETRY;  Surgeon: Wallace Going, DO;  Location: [place];  Service: Plastics;  Laterality: Bilateral;  . LIPOSUCTION WITH LIPOFILLING Bilateral 01/21/2017   Procedure: BILATERAL BREAST  LIPOFILLING FOR ASYMMETRY;  Surgeon: Wallace Going, DO;  Location: [place];  Service: Plastics;  Laterality: Bilateral;  . MASTECTOMY Bilateral 01/09/2016  . NIPPLE SPARING MASTECTOMY/SENTINAL LYMPH NODE BIOPSY/RECONSTRUCTION/PLACEMENT OF TISSUE EXPANDER Bilateral 01/09/2016   Procedure: BILATERAL NIPPLE SPARING MASTECTOMY WITH LEFT SENTINAL LYMPH NODE BIOPSY ;  Surgeon: Stark Klein, MD;  Location: [place];  Service: General;  Laterality: Bilateral;  . REMOVAL OF BILATERAL TISSUE EXPANDERS WITH PLACEMENT OF BILATERAL BREAST IMPLANTS Bilateral 08/20/2016   Procedure: REMOVAL OF BILATERAL TISSUE EXPANDERS WITH PLACEMENT OF BILATERAL SILICONE IMPLANTS;  Surgeon: Wallace Going, DO;  Location: [place];  Service: Plastics;  Laterality: Bilateral;  . REMOVAL OF BILATERAL TISSUE EXPANDERS WITH PLACEMENT OF BILATERAL BREAST IMPLANTS Bilateral 11/05/2017   Procedure: REMOVAL OF BILATERAL TISSUE EXPANDERS WITH PLACEMENT OF BILATERAL BREAST SILICONE IMPLANTS;  Surgeon: Wallace Going, DO;  Location: [employer];  Service: Plastics;  Laterality: Bilateral;  . REMOVAL OF TISSUE EXPANDER Bilateral 02/11/2016   Procedure: REMOVAL OF BILATERAL TISSUE  EXPANDERS AND FLEX HD REMOVAL;  Surgeon: Wallace Going, DO;  Location: [place];  Service: Plastics;  Laterality: Bilateral;  . TISSUE EXPANDER PLACEMENT Bilateral 08/12/2017   Procedure: PLACEMENT OF BILATERAL TISSUE EXPANDER;  Surgeon: Wallace Going, DO;  Location: [place];  Service: Plastics;  Laterality: Bilateral;  . TUBAL LIGATION Bilateral 1991    OB History    Gravida  4   Para  3   Term      Preterm      AB      Living  3     SAB      TAB      Ectopic      Multiple      Live Births               Home Medications    Prior to Admission medications   Medication Sig Start Date End Date Taking? Authorizing Provider  amitriptyline (ELAVIL) 100 MG tablet Take 100 mg by mouth at bedtime.    [provider]  citalopram (CELEXA) 20 MG tablet Take 20 mg by mouth daily.    [provider]    Family History Family History  Problem Relation Age of Onset  . Heart failure Father   . Prostate cancer Father 90  . Colon polyps Mother  approx 2  . Other Mother        hx HPV and hysterectomy due to precancerous cells  . Depression Mother   . Anxiety disorder Mother   . Other Sister 35       hx of hysterectomy for unspecified reason; still has ovaries  . Other Sister 48       paternal half-sister hx of hysterectomy for unspecified reason; still has ovaries  . Bladder Cancer Maternal Uncle 79       not a smoker  . Kidney failure Maternal Grandmother   . Congestive Heart Failure Maternal Grandmother   . Colon cancer Maternal Grandmother 57  . Lung cancer Maternal Grandfather 17       smoker  . Breast cancer Paternal Grandmother        dx. early 41s; w/ hx of trauma to breast  . Crohn's disease Daughter   . Lung cancer Maternal Uncle 11       smoker    Social History Social History   Tobacco Use  . Smoking status: Former Smoker    Packs/day: 1.00    Years: 10.00    Pack years: 10.00      Types: Cigarettes    Quit date: 06/08/2008    Years since quitting: 11.9  . Smokeless tobacco: Never Used  Vaping Use  . Vaping Use: Never used  Substance Use Topics  . Alcohol use: No  . Drug use: Never     Allergies   Patient has no known allergies.   Review of Systems Review of Systems  Reason unable to perform ROS: See HPI as above.     Physical Exam Triage Vital Signs ED Triage Vitals  Enc Vitals Group     BP 05/26/20 1222 127/78     Pulse Rate 05/26/20 1222 75     Resp 05/26/20 1222 17     Temp 05/26/20 1222 98 F (36.7 C)     Temp Source 05/26/20 1222 Oral     SpO2 05/26/20 1222 96 %     Weight --      Height --      Head Circumference --      Peak Flow --      Pain Score 05/26/20 1224 3     Pain Loc --      Pain Edu? --      Excl. in [place]? --    No data found.  Updated Vital Signs BP 127/78 (BP Location: Right Arm)   Pulse 75   Temp 98 F (36.7 C) (Oral)   Resp 17   SpO2 96%   Physical Exam Constitutional:      General: She is not in acute distress.    Appearance: Normal appearance. She is not ill-appearing, toxic-appearing or diaphoretic.  HENT:     Head: Normocephalic and atraumatic.     Mouth/Throat:     Mouth: Mucous membranes are moist.     Pharynx: Oropharynx is clear. Uvula midline.  Cardiovascular:     Rate and Rhythm: Normal rate and regular rhythm.     Heart sounds: Normal heart sounds. No murmur heard.  No friction rub. No gallop.   Pulmonary:     Effort: Pulmonary effort is normal. No accessory muscle usage, prolonged expiration, respiratory distress or retractions.     Comments: Lungs clear to auscultation without adventitious lung sounds. Musculoskeletal:     Cervical back: Normal range of motion and neck supple.  Neurological:  General: No focal deficit present.     Mental Status: She is alert and oriented to person, place, and time.     UC Treatments / Results  Labs (all labs ordered are listed, but only  abnormal results are displayed) Labs Reviewed  NOVEL CORONAVIRUS, NAA    EKG   Radiology No results found.  Procedures Procedures (including critical care time)  Medications Ordered in UC Medications - No data to display  Initial Impression / Assessment and Plan / UC Course  I have reviewed the triage vital signs and the nursing notes.  Pertinent labs & imaging results that were available during my care of the patient were reviewed by me and considered in my medical decision making (see chart for details).    COVID PCR test ordered. Patient to quarantine until testing results return. No alarming signs on exam. LCTAB. Symptomatic treatment discussed.  Push fluids.  Return precautions given.  Patient expresses understanding and agrees to plan.  Final Clinical Impressions(s) / UC Diagnoses   Final diagnoses:  Cough    ED Prescriptions    None     PDMP not reviewed this encounter.   Ok Edwards, PA-C 05/26/20 1308

## 2020-05-26 NOTE — Discharge Instructions (Signed)
COVID PCR testing ordered. I would like you to quarantine until testing results. You can take over the counter flonase/nasacort to help with nasal congestion/drainage. Tylenol/motrin for pain and fever. Keep hydrated, urine should be clear to pale yellow in color. If experiencing shortness of breath, trouble breathing, go to the emergency department for further evaluation needed.

## 2020-05-28 ENCOUNTER — Telehealth: Payer: Self-pay

## 2020-05-28 LAB — SARS-COV-2, NAA 2 DAY TAT

## 2020-05-28 LAB — NOVEL CORONAVIRUS, NAA: SARS-CoV-2, NAA: DETECTED — AB

## 2020-05-28 NOTE — Telephone Encounter (Signed)
Pt notified of positive COVID-19 test results. Pt verbalized understanding. Pt reports that she has a cough, loss of taste and smell, body aches. She says her symptoms started on 05/25/20 and she [facility] to the [facility].Pt advised to remain in self quarantine until at least 10 days since symptom onset And at least 24 hours fever free without antipyretics And improvement in respiratory symptoms. Patient advised to utilize over the counter medications to treat symptoms. Pt advised to seek treatment in the ED if respiratory issues/distress develops.Pt advised they should only leave home to seek and medical care and must wear a mask in public. Pt instructed to limit contact with family members or caregivers in the home. Pt advised to practice social distancing and to continue to use good preventative care measures such has frequent hand washing, staying out of crowds and cleaning hard surfaces frequently touched in the home.Pt informed that the health department will likely follow up and may have additional recommendations. Advised to contact PCP. Advised to have family members notify their PCP for instructions on testing and they should quarantine 14 days due to exposure. Will notify [facility].

## 2020-05-29 ENCOUNTER — Telehealth: Payer: Self-pay | Admitting: Internal Medicine

## 2020-05-29 ENCOUNTER — Ambulatory Visit (INDEPENDENT_AMBULATORY_CARE_PROVIDER_SITE_OTHER): Payer: Self-pay | Admitting: Family Medicine

## 2020-05-29 NOTE — Telephone Encounter (Signed)
Called to Discuss with patient about Covid symptoms and the use of the monoclonal antibody infusion for those with mild to moderate Covid symptoms and at a high risk of hospitalization.     Pt appears to qualify for this infusion due to co-morbid conditions and/or a member of an at-risk group in accordance with the FDA Emergency Use Authorization.    Unable to reach pt. Left VM and MCM  Alan Ripper, NP [place]

## 2020-06-04 ENCOUNTER — Telehealth: Payer: Self-pay | Admitting: Surgical

## 2020-06-04 ENCOUNTER — Telehealth (INDEPENDENT_AMBULATORY_CARE_PROVIDER_SITE_OTHER): Payer: Self-pay | Admitting: Psychology

## 2020-06-04 ENCOUNTER — Encounter (INDEPENDENT_AMBULATORY_CARE_PROVIDER_SITE_OTHER): Payer: 59 | Admitting: Psychology

## 2020-06-04 ENCOUNTER — Other Ambulatory Visit: Payer: Self-pay

## 2020-06-04 ENCOUNTER — Encounter (INDEPENDENT_AMBULATORY_CARE_PROVIDER_SITE_OTHER): Payer: Self-pay

## 2020-06-04 NOTE — Telephone Encounter (Signed)
  Office: [phone]  /  Fax: [phone]  Date of Call: June 04, 2020  Time of Call: 4:06pm Provider: Glennie Isle, PsyD  CONTENT: This provider called Christina [name] to check-in as she did not present for today's MyChart Video Visit appointment at 4:00pm. A HIPAA compliant voicemail was left requesting a call back.   PLAN: This provider will wait for Christina [name] to call back. No further follow-up planned by this provider.

## 2020-06-04 NOTE — Telephone Encounter (Signed)
Called patient back to advise that patient does not have to take covid test on 10/18, per [facility] scheduler. Patient stated understanding.

## 2020-06-07 ENCOUNTER — Ambulatory Visit: Payer: 59 | Admitting: Psychology

## 2020-06-12 ENCOUNTER — Ambulatory Visit (INDEPENDENT_AMBULATORY_CARE_PROVIDER_SITE_OTHER): Payer: 59 | Admitting: Surgical

## 2020-06-12 DIAGNOSIS — Z9889 Other specified postprocedural states: Secondary | ICD-10-CM

## 2020-06-12 DIAGNOSIS — Z9013 Acquired absence of bilateral breasts and nipples: Secondary | ICD-10-CM

## 2020-06-12 DIAGNOSIS — N651 Disproportion of reconstructed breast: Secondary | ICD-10-CM

## 2020-06-12 NOTE — Progress Notes (Signed)
No show

## 2020-06-13 ENCOUNTER — Encounter (INDEPENDENT_AMBULATORY_CARE_PROVIDER_SITE_OTHER): Payer: Self-pay | Admitting: Family Medicine

## 2020-06-13 ENCOUNTER — Other Ambulatory Visit: Payer: Self-pay

## 2020-06-13 ENCOUNTER — Ambulatory Visit (INDEPENDENT_AMBULATORY_CARE_PROVIDER_SITE_OTHER): Payer: 59 | Admitting: Family Medicine

## 2020-06-13 ENCOUNTER — Telehealth (INDEPENDENT_AMBULATORY_CARE_PROVIDER_SITE_OTHER): Payer: Self-pay | Admitting: Family Medicine

## 2020-06-13 VITALS — BP 114/79 | HR 79 | Temp 97.9°F | Ht 68.0 in | Wt 216.0 lb

## 2020-06-13 DIAGNOSIS — F3289 Other specified depressive episodes: Secondary | ICD-10-CM

## 2020-06-13 DIAGNOSIS — E538 Deficiency of other specified B group vitamins: Secondary | ICD-10-CM

## 2020-06-13 DIAGNOSIS — R7303 Prediabetes: Secondary | ICD-10-CM | POA: Diagnosis not present

## 2020-06-13 DIAGNOSIS — E669 Obesity, unspecified: Secondary | ICD-10-CM

## 2020-06-13 DIAGNOSIS — Z9189 Other specified personal risk factors, not elsewhere classified: Secondary | ICD-10-CM | POA: Diagnosis not present

## 2020-06-13 DIAGNOSIS — D649 Anemia, unspecified: Secondary | ICD-10-CM

## 2020-06-13 DIAGNOSIS — E559 Vitamin D deficiency, unspecified: Secondary | ICD-10-CM | POA: Diagnosis not present

## 2020-06-13 DIAGNOSIS — Z6833 Body mass index (BMI) 33.0-33.9, adult: Secondary | ICD-10-CM

## 2020-06-13 MED ORDER — CITALOPRAM HYDROBROMIDE 20 MG PO TABS: 10.0000 mg | ORAL_TABLET | Freq: Every day | ORAL | Status: DC

## 2020-06-13 MED ORDER — FLUOXETINE HCL 10 MG PO TABS: ORAL_TABLET | ORAL | 0 refills | Status: DC

## 2020-06-13 MED ORDER — CYANOCOBALAMIN 500 MCG PO TABS: 500.0000 ug | ORAL_TABLET | Freq: Every day | ORAL | Status: DC

## 2020-06-13 MED ORDER — VITAMIN D (ERGOCALCIFEROL) 1.25 MG (50000 UNIT) PO CAPS: 50000.0000 [IU] | ORAL_CAPSULE | ORAL | 0 refills | Status: DC

## 2020-06-13 NOTE — Telephone Encounter (Signed)
[facility] called concerning the new prescription for Fluoxetine.  The patient is already on Amitrityline and Citalopram 20mg  from her PCP.  Pharmacist wants to know if this is a new plan and to let you know there are interactions with the drugs.  Please call the pharmacy at [phone] and you can speak to any pharmacist.

## 2020-06-14 NOTE — Progress Notes (Signed)
Chief Complaint:   OBESITY Christina [name] is here to discuss her progress with her obesity treatment plan along with follow-up of her obesity related diagnoses. Christina [name] is on the Category 2 Plan and states she is following her eating plan approximately 0% of the time. [name] states she is walking at work for 2-3 hours 5 times per week.   Today's visit was #: 2 Starting weight: 218 lbs Starting date: 05/15/2020 Today's weight: 216 lbs Today's date: 06/13/2020 Total lbs lost to date: 2 lbs Total lbs lost since last in-office visit: 2 lbs    Interim History:  First OV with me was on 05/15/20  [name] had [place] and was sick around 05/26/2020.  She is much better and only had cough and felt super run-down and had headaches.  She was last seen 1 month ago.    -She has not really bought the food yet.    She has been really struggling with motivation and increased depression.    PLAN: Today we started vitamin B12, vitamin D and Prozac.  Decreased Celexa dose today as well.    Assessment/Plan:   1. Vitamin D deficiency  New.    Discussed labs with patient today.    Current vitamin D is 21.2, tested on 05/15/2020. Not at goal. Optimal goal > 50 ng/dL. There is also evidence to support a goal of >70 ng/dL in patients with cancer and heart disease.    Plan:  - Vitamin D @50 ,000 IU every week with follow-up for routine testing of Vitamin D at least 2-3 times per year to avoid over-replacement.  -START Vitamin D, Ergocalciferol, (DRISDOL) 1.25 MG (50000 UNIT) CAPS capsule; Take 1 capsule (50,000 Units total) by mouth every 7 (seven) days.  Dispense: 4 capsule; Refill: 0     2. Prediabetes  New.   Discussed labs with patient today.    Teigen has a diagnosis of prediabetes based on her elevated HgA1c and was informed this puts her at greater risk of developing diabetes. She needs to work on diet and exercise to decrease her risk of diabetes. She denies symptoms or  concerns.    Plan:    - I counseled patient on pathophysiology of the disease process of Pre-DM.  - Stressed importance of dietary and lifestyle modifications resulting in weight loss as first line, in addition to mentioning metformin and other medication options which can help Christina [name] in the management of this disease process.   - Goal is HgbA1c < 5.7 and insulin level closer to 5 or less.    Continue prudent nutritional plan and weight loss. Pt agreed to treatment plan today which was conceived using shared medical decision making- declines meds at this time esp due to starting all the others.     - Handouts provided at pt's request after education provided and all concerns/questions addressed.    - Anticipatory guidance given.   - Recheck A1c and fasting insulin level in approximately 3 months from last check or as deemed fit.   Lab Results  Component Value Date   HGBA1C 6.3 (H) 05/15/2020   Lab Results  Component Value Date   INSULIN 34.7 (H) 05/15/2020        3. Anemia, unspecified type  Discussed labs with patient today.    Christina [name] is not a vegetarian.  She does not have a history of weight loss surgery.  She is occ taking an OTC iron supplement, but not regularly.   Plan:  Labs are  stable.  Continue prudent nutritional plan; encouraged pt to choose Fe-rich foods along with vit C to enhance absorption.  Will cont closely monitor.   CBC Latest Ref Rng & Units 05/15/2020 12/20/2019 01/27/2016  WBC 3.4 - 10.8 x10E3/uL 6.8 - 14.2(H)  Hemoglobin 11.1 - 15.9 g/dL 13.8 13.6 11.8(L)  Hematocrit 34.0 - 46.6 % 42.0 40.0 37.0  Platelets 150 - 450 x10E3/uL 308 - 376   Lab Results  Component Value Date   VITAMINB12 211 (L) 05/15/2020     4. Vitamin B12 deficiency New.    Discussed labs with patient today.    Christina [name] notes fatigue. She is not a vegetarian.  She does not have a previous diagnosis of pernicious anemia.  She does not have a history of weight loss surgery.     Plan:  Labs are currently unstable- too low.    Start vitamin B12 supplement.  Lab Results  Component Value Date   VITAMINB12 211 (L) 05/15/2020   -Start vitamin B-12 (CYANOCOBALAMIN) 500 MCG tablet; Take 1 tablet (500 mcg total) by mouth daily.     5. Other depression, with emotional eating  Worsening.    Discussed labs with patient today.    Christina [name] has been on Celexa since January 2021 from her PCP.    Dr. Mallie Mussel sent her to [facility] and she had an appointment, but had to give plasma for money and pt had to miss her appointment.  No SI, but a lot of sad days  Wellbutrin made her gain a lot of weight - 30-40 pounds in 6 months.  She tried Effexor and it did not help.  Celexa and Zoloft did not help.  She has never tried Prozac.   Plan:  - Mood is not well controlled currently   - Counseled patient on pathophysiology of disease and discussed various treatment options, which often includes dietary and lifestyle modifications as first line, in addition to discussing the risks and benefits of various medications.   - Told patient to exercise at least 15 minutes per day of speed walking  - discussed benefits of daily gratefulness journal  - Use free meditation apps and do "stress meditation" or "meditation for anxiety or depression" for 10 minutes to 15 minutes twice daily. - For guided meditation/ breathing exercises, advised patient to look into the Headspace, Calm, Breathe, or Ten Percent apps.  -Explained to patient we need to infuse their brain with more positive thoughts, getting them to become more hopeful and feel more in control.  - In addition to possible prescription interventions, reviewed the "spokes of the wheel" of mood and health management.   Stressed the importance of ongoing prudent health habits, including regular exercise, appropriate sleep hygiene, healthy dietary habits, prayer/ meditation to help with mood stability and seeking the help of a professional  counselor discussed.    - Anticipatory guidance given and extensive counseling and education provided to patient today.  All questions answered.  --> Call PCP regarding Psychiatry referral for medication management.    ---> Call for appointment with [facility] for counseling.  -Start FLUoxetine (PROZAC) 10 MG tablet; Use one half tablet daily for 1 week then increase to 1 tablet daily  Dispense: 30 tablet; Refill: 0  -Refill and DECREASE citalopram (CELEXA) 20 MG tablet; Take 0.5 tablets (10 mg total) by mouth daily.     6. At risk for diabetes mellitus - Nyasha was given extensive diabetes prevention education and counseling today of more than 22 minutes.  -  Counseled patient on pathophysiology of disease and discussed various treatment options which always includes dietary and lifestyle modification as first line.   - Importance of healthy diet with very limited amounts of simple carbohydrates discussed with patient in addition to regular aerobic exercise to an eventual goal of 60min 5d/week or more.  - Handouts provided at patient's desire and or told to go online at the American Diabetes Association website for further information    7. Class 1 obesity with serious comorbidity and body mass index (BMI) of 33.0 to 33.9 in adult, unspecified obesity type  Darleen is currently in the action stage of change. As such, her goal is to continue with weight loss efforts. She has agreed to the Category 2 Plan.   Exercise goals: Speed waking for 15 minutes everyday.  Behavioral modification strategies: increasing lean protein intake, decreasing simple carbohydrates, decreasing liquid calories, decreasing eating out, no skipping meals, meal planning and cooking strategies and planning for success.  Canesha has agreed to follow-up with our clinic in 2 weeks. She was informed of the importance of frequent follow-up visits to maximize her success with intensive lifestyle modifications for her multiple  health conditions.   Objective:   Blood pressure 114/79, pulse 79, temperature 97.9 F (36.6 C), height 5\' 8"  (1.727 m), weight 216 lb (98 kg), SpO2 100 %. Body mass index is 32.84 kg/m.  General: Cooperative, alert, well developed, in no acute distress. HEENT: Conjunctivae and lids unremarkable. Cardiovascular: Regular rhythm.  Lungs: Normal work of breathing. Neurologic: No focal deficits.   Lab Results  Component Value Date   CREATININE 0.71 05/15/2020   BUN 10 05/15/2020   NA 138 05/15/2020   K 4.7 05/15/2020   CL 105 05/15/2020   CO2 23 05/15/2020   Lab Results  Component Value Date   ALT 34 (H) 05/15/2020   AST 23 05/15/2020   ALKPHOS 52 05/15/2020   BILITOT 0.3 05/15/2020   Lab Results  Component Value Date   HGBA1C 6.3 (H) 05/15/2020   Lab Results  Component Value Date   INSULIN 34.7 (H) 05/15/2020   Lab Results  Component Value Date   TSH 1.730 05/15/2020   Lab Results  Component Value Date   CHOL 125 05/15/2020   HDL 36 (L) 05/15/2020   LDLCALC 71 05/15/2020   TRIG 95 05/15/2020   Lab Results  Component Value Date   WBC 6.8 05/15/2020   HGB 13.8 05/15/2020   HCT 42.0 05/15/2020   MCV 87 05/15/2020   PLT 308 05/15/2020   Attestation Statements:   Reviewed by clinician on day of visit: allergies, medications, problem list, medical history, surgical history, family history, social history, and previous encounter notes.  I, Water quality scientist, CMA, am acting as Location manager for [employer], DO.  I have reviewed the above documentation for accuracy and completeness, and I agree with the above. Mellody Dance, DO

## 2020-06-14 NOTE — Telephone Encounter (Signed)
Spoke with the pharmacist and informed them of the patient decreasing the Citalopram to 10mg  and starting Fluoxetine at a half of tab per Dr Raliegh Scarlet.  Reatha Sur, [place]

## 2020-06-18 ENCOUNTER — Telehealth (INDEPENDENT_AMBULATORY_CARE_PROVIDER_SITE_OTHER): Payer: Self-pay

## 2020-06-18 NOTE — Telephone Encounter (Signed)
Dr Opalski, please advise  

## 2020-06-18 NOTE — Telephone Encounter (Signed)
Pt called stating that [facility] on [address] had a pop of Drug  interaction for her PROZAC, and that they needed to contact DO before refilling medication. Please advise.

## 2020-06-19 NOTE — Telephone Encounter (Signed)
I understand and discussed the possible s-e with pt ( SS) with her serotoninergic meds.  Ok to fill

## 2020-06-21 ENCOUNTER — Encounter (HOSPITAL_BASED_OUTPATIENT_CLINIC_OR_DEPARTMENT_OTHER): Payer: Self-pay | Admitting: Plastic Surgery

## 2020-06-21 ENCOUNTER — Other Ambulatory Visit: Payer: Self-pay

## 2020-06-21 NOTE — Telephone Encounter (Signed)
Call to [facility].  Drug is not covered on her formulary.  Prozac 40 mg is.  Call to patient.  Explained to patient that prescription copay for medication ordered per the pharmacy was only $4.00.  Pt agreed to pay the copay and pick up the medication.  Pt had no other needs, verbalized understanding, call ended.

## 2020-06-23 NOTE — Progress Notes (Addendum)
Spoke with the pt's family member to inform the pt not to show for covid test on Mon 10/18 due to pt testing + for covid on 05/26/20 (results in Epic). Based on the guidelines the pt is in the 90 day window to not retest. The pt is still expected to quarantine until their procedure. Therefore, the pt can still have the scheduled procedure on Thurs 06/28/20.   These are the guidelines as follows:  Guidance: Patient previously tested + COVID; now past 90 day window seeking elective surgery (asymptomatic)  Retest patient If negative, proceed with surgery If positive, postpone surgery for 10 days from positive test Patient to quarantine for the (10 days) Do not retest again prior to surgery (even if scheduled a couple of weeks out) Use standard precautions for surgery

## 2020-06-24 NOTE — H&P (View-Only) (Signed)
ICD-10-CM   1. Breast asymmetry following reconstructive surgery  N65.1   2. S/P breast reconstruction, bilateral  Z98.890       Patient ID: Christina [name], female    DOB: 11-21-1965, 54 y.o.   MRN: [id number]   History of Present Illness: Christina [name] is a 54 y.o.  female  with a history of breast asymmetry following reconstructive surgery.  She presents for preoperative evaluation for upcoming procedure, lipofilling of bilateral breasts, scheduled for 06/28/2020 with Dr. Marla Roe.  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  Patient has Mentor ultra high profile 875 cc silicone implants in place bilaterally.  Implants were placed in February 2019.  Patient recently had an evaluation of her breasts via ultrasound which showed no signs of extracapsular silicone.  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  The patient has had problems with anesthesia. PONV. The scop patch works well for her.  Past Medical History: Allergies: No Known Allergies  Current Medications:  Current Outpatient Medications:  .  amitriptyline (ELAVIL) 100 MG tablet, Take 100 mg by mouth at bedtime., Disp: , Rfl:  .  citalopram (CELEXA) 20 MG tablet, Take 0.5 tablets (10 mg total) by mouth daily., Disp: , Rfl:  .  vitamin B-12 (CYANOCOBALAMIN) 500 MCG tablet, Take 1 tablet (500 mcg total) by mouth daily., Disp: , Rfl:  .  Vitamin D, Ergocalciferol, (DRISDOL) 1.25 MG (50000 UNIT) CAPS capsule, Take 1 capsule (50,000 Units total) by mouth every 7 (seven) days., Disp: 4 capsule, Rfl: 0 .  cephALEXin (KEFLEX) 500 MG capsule, Take 1 capsule (500 mg total) by mouth 4 (four) times daily for 3 days. For use AFTER Surgery, Disp: 12 capsule, Rfl: 0 .  FLUoxetine  (PROZAC) 10 MG tablet, Use one half tablet daily for 1 week then increase to 1 tablet daily (Patient not taking: Reported on 06/25/2020), Disp: 30 tablet, Rfl: 0 .  HYDROcodone-acetaminophen (NORCO) 5-325 MG tablet, Take 1 tablet by mouth every 8 (eight) hours as needed for up to 3 days for severe pain. For use AFTER Surgery, Disp: 9 tablet, Rfl: 0 .  ondansetron (ZOFRAN) 4 MG tablet, Take 1 tablet (4 mg total) by mouth every 8 (eight) hours as needed for nausea or vomiting., Disp: 20 tablet, Rfl: 0  Past Medical Problems: Past Medical History:  Diagnosis Date  . Anemia   . Arthritis    hands and knees  . 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 Depression   . GAD (generalized anxiety disorder)   . Gallbladder problem   . History of ovarian cyst   . IBS (irritable bowel syndrome)   . Joint pain   . PONV (postoperative nausea and vomiting)    does well with scop patch  . Pre-diabetes    followed by pcp  . Right knee meniscal tear   . Urgency of urination     Past Surgical History: Past Surgical History:  Procedure Laterality Date  . ABDOMINAL HYSTERECTOMY  05/1996   endometriosis  . ACHILLES TENDON REPAIR Right 2010;  revision 2011  . BLADDER SUSPENSION  2000  . BREAST BIOPSY Left  10/2015  . BREAST IMPLANT REMOVAL Bilateral 08/12/2017   Procedure: REMOVAL BILATERAL BREAST IMPLANTS;  Surgeon: Wallace Going, DO;  Location: [place];  Service: Plastics;  Laterality: Bilateral;  . BREAST RECONSTRUCTION WITH PLACEMENT OF TISSUE EXPANDER AND FLEX HD (ACELLULAR HYDRATED DERMIS) Bilateral 01/09/2016  . BREAST RECONSTRUCTION WITH PLACEMENT OF TISSUE EXPANDER AND FLEX HD (ACELLULAR HYDRATED DERMIS) Bilateral 01/09/2016   Procedure: BREAST RECONSTRUCTION WITH PLACEMENT OF TISSUE EXPANDER AND FLEX HD (ACELLULAR HYDRATED  DERMIS);  Surgeon: Loel Lofty Dillingham, DO;  Location: [place];  Service: Plastics;  Laterality: Bilateral;  . BREAST RECONSTRUCTION WITH PLACEMENT OF TISSUE EXPANDER AND FLEX HD (ACELLULAR HYDRATED DERMIS) Bilateral 05/29/2016   Procedure: PLACEMENT OF BILATERAL TISSUE EXPANDER AND FLEX HD (ACELLULAR HYDRATED DERMIS);  Surgeon: Wallace Going, DO;  Location: [place];  Service: Plastics;  Laterality: Bilateral;  . BREAST REDUCTION SURGERY Bilateral 11/26/2016   Procedure: BILATERAL BREAST CAPSULE CONTRACTURE RELASE;  Surgeon: Wallace Going, DO;  Location: [place];  Service: Plastics;  Laterality: Bilateral;  . [place]; 1989; 1991  . ELBOW SURGERY Right x3   last one 02-01-2019 @ [facility]  . FAT GRAFTING BILATERAL BREAST  08-09-2018  @[facility]   . INCISION AND DRAINAGE OF WOUND Bilateral 02/11/2016   Procedure: IRRIGATION AND DEBRIDEMENT OF BILATERAL BREAST POCKET;  Surgeon: Wallace Going, DO;  Location: [place];  Service: Plastics;  Laterality: Bilateral;  . KNEE ARTHROSCOPY WITH MEDIAL MENISECTOMY Right 12/20/2019   Procedure: KNEE ARTHROSCOPY WITH MEDIAL MENISECTOMY;  Surgeon: Marchia Bond, MD;  Location: [place];  Service: Orthopedics;  Laterality: Right;  . LAPAROSCOPIC APPENDECTOMY  04-07-2011   @[facility]    w/ Excision peritoneal lipoma and lysis adhesions  . LAPAROSCOPIC CHOLECYSTECTOMY  ~ 1999  . LIPOSUCTION WITH LIPOFILLING Bilateral 11/26/2016   Procedure: LIPOFILLING FOR SYMMETRY;  Surgeon: Wallace Going, DO;  Location: [place];  Service: Plastics;  Laterality: Bilateral;  . LIPOSUCTION WITH LIPOFILLING Bilateral 01/21/2017   Procedure: BILATERAL BREAST  LIPOFILLING FOR ASYMMETRY;  Surgeon: Wallace Going, DO;  Location: [place];  Service: Plastics;  Laterality: Bilateral;  . MASTECTOMY Bilateral 01/09/2016  . NIPPLE SPARING MASTECTOMY/SENTINAL LYMPH NODE  BIOPSY/RECONSTRUCTION/PLACEMENT OF TISSUE EXPANDER Bilateral 01/09/2016   Procedure: BILATERAL NIPPLE SPARING MASTECTOMY WITH LEFT SENTINAL LYMPH NODE BIOPSY ;  Surgeon: Stark Klein, MD;  Location: [place];  Service: General;  Laterality: Bilateral;  . REMOVAL OF BILATERAL TISSUE EXPANDERS WITH PLACEMENT OF BILATERAL BREAST IMPLANTS Bilateral 08/20/2016   Procedure: REMOVAL OF BILATERAL TISSUE EXPANDERS WITH PLACEMENT OF BILATERAL SILICONE IMPLANTS;  Surgeon: Wallace Going, DO;  Location: [place];  Service: Plastics;  Laterality: Bilateral;  . REMOVAL OF BILATERAL TISSUE EXPANDERS WITH PLACEMENT OF BILATERAL BREAST IMPLANTS Bilateral 11/05/2017   Procedure: REMOVAL OF BILATERAL TISSUE EXPANDERS WITH PLACEMENT OF BILATERAL BREAST SILICONE IMPLANTS;  Surgeon: Wallace Going, DO;  Location: [place];  Service: Plastics;  Laterality: Bilateral;  . REMOVAL OF TISSUE EXPANDER Bilateral 02/11/2016   Procedure: REMOVAL OF BILATERAL TISSUE EXPANDERS AND FLEX HD REMOVAL;  Surgeon: Wallace Going, DO;  Location: [place];  Service: Plastics;  Laterality: Bilateral;  . TISSUE EXPANDER PLACEMENT Bilateral 08/12/2017   Procedure: PLACEMENT OF BILATERAL TISSUE EXPANDER;  Surgeon: Wallace Going, DO;  Location: [place];  Service: Plastics;  Laterality: Bilateral;  . TUBAL LIGATION Bilateral 1991    Social History: Social  History   Socioeconomic History  . Marital status: Single    Spouse name: Not on file  . Number of children: Not on file  . Years of education: Not on file  . Highest education level: Not on file  Occupational History  . Occupation: Holiday representative: Education administrator  Tobacco Use  . Smoking status: Former Smoker    Packs/day: 1.00    Years: 10.00    Pack years: 10.00    Types: Cigarettes    Quit date: 06/08/2008    Years since quitting: 12.0  . Smokeless tobacco: Never Used  Vaping  Use  . Vaping Use: Never used  Substance and Sexual Activity  . Alcohol use: No  . Drug use: Never  . Sexual activity: Never    Birth control/protection: Surgical    Comment: hysterectomy  Other Topics Concern  . Not on file  Social History Narrative  . Not on file   Social Determinants of Health   Financial Resource Strain:   . Difficulty of Paying Living Expenses: Not on file  Food Insecurity:   . Worried About Charity fundraiser in the Last Year: Not on file  . Ran Out of Food in the Last Year: Not on file  Transportation Needs:   . Lack of Transportation (Medical): Not on file  . Lack of Transportation (Non-Medical): Not on file  Physical Activity:   . Days of Exercise per Week: Not on file  . Minutes of Exercise per Session: Not on file  Stress:   . Feeling of Stress : Not on file  Social Connections:   . Frequency of Communication with Friends and Family: Not on file  . Frequency of Social Gatherings with Friends and Family: Not on file  . Attends Religious Services: Not on file  . Active Member of Clubs or Organizations: Not on file  . Attends Archivist Meetings: Not on file  . Marital Status: Not on file  Intimate Partner Violence:   . Fear of Current or Ex-Partner: Not on file  . Emotionally Abused: Not on file  . Physically Abused: Not on file  . Sexually Abused: Not on file    Family History: Family History  Problem Relation Age of Onset  . Heart failure Father   . Prostate cancer Father 40  . Colon polyps Mother        approx 2  . Other Mother        hx HPV and hysterectomy due to precancerous cells  . Depression Mother   . Anxiety disorder Mother   . Other Sister 58       hx of hysterectomy for unspecified reason; still has ovaries  . Other Sister 69       paternal half-sister hx of hysterectomy for unspecified reason; still has ovaries  . Bladder Cancer Maternal Uncle 79       not a smoker  . Kidney failure Maternal Grandmother   .  Congestive Heart Failure Maternal Grandmother   . Colon cancer Maternal Grandmother 77  . Lung cancer Maternal Grandfather 61       smoker  . Breast cancer Paternal Grandmother        dx. early 32s; w/ hx of trauma to breast  . Crohn's disease Daughter   . Lung cancer Maternal Uncle 37       smoker    Review of Systems: Review of Systems  Constitutional: Negative for chills and fever.  HENT: Negative for congestion and sore throat.   Respiratory: Negative for cough and shortness of breath.   Cardiovascular: Negative for chest pain.  Gastrointestinal: Negative for abdominal pain, nausea and vomiting.  Skin: Negative for itching and rash.    Physical Exam: Vital Signs BP 117/64 (BP Location: Left Arm, Patient Position: Sitting, Cuff Size: Large)   Pulse 75   Temp 97.7 F (36.5 C) (Oral)   SpO2 98%  Physical Exam Vitals and nursing note reviewed.  Constitutional:      General: She is not in acute distress.    Appearance: Normal appearance. She is normal weight. She is not ill-appearing.  HENT:     Head: Normocephalic and atraumatic.  Eyes:     Extraocular Movements: Extraocular movements intact.  Cardiovascular:     Rate and Rhythm: Normal rate and regular rhythm.     Pulses: Normal pulses.     Heart sounds: Normal heart sounds.  Pulmonary:     Effort: Pulmonary effort is normal.     Breath sounds: Normal breath sounds. No wheezing, rhonchi or rales.  Abdominal:     General: Bowel sounds are normal.     Palpations: Abdomen is soft.  Musculoskeletal:        General: No swelling. Normal range of motion.     Cervical back: Normal range of motion.  Skin:    General: Skin is warm and dry.     Coloration: Skin is not pale.     Findings: No erythema or rash.  Neurological:     General: No focal deficit present.     Mental Status: She is alert and oriented to person, place, and time.  Psychiatric:        Mood and Affect: Mood normal.        Behavior: Behavior normal.         Thought Content: Thought content normal.        Judgment: Judgment normal.     Assessment/Plan:  Ms. Kneebone scheduled for lipofilling of bilateral breasts with Dr. Marla Roe.  Risks, benefits, and alternatives of procedure discussed, questions answered and consent obtained.    Smoking Status: Former smoker; Counseling Given? N/A Last Mammogram: N/A-bilateral mastectomies  Caprini Score: 6 high; Risk Factors include: 54 year old female, Hx cancer, BMI > 25, and length of planned surgery. Recommendation for mechanical and pharmacological prophylaxis during surgery. Encourage early ambulation.  Pictures obtained: 04/24/2020  Post-op Rx sent to pharmacy: Keflex, Zofran, Norco  Patient was provided with the General Surgical Risk consent document and Pain Medication Agreement prior to their appointment.  They had adequate time to read through the risk consent documents and Pain Medication Agreement. We also discussed them in person together during this preop appointment. All of their questions were answered to their satisfaction.  Recommended calling if they have any further questions.  Risk consent form and Pain Medication Agreement to be scanned into patient's chart.  The risks that can be encountered with and after liposuction were discussed and include the following but no limited to these:  Asymmetry, fluid accumulation, firmness of the area, fat necrosis with death of fat tissue, bleeding, infection, delayed healing, anesthesia risks, skin sensation changes, injury to structures including nerves, blood vessels, and muscles which may be temporary or permanent, allergies to tape, suture materials and glues, blood products, topical preparations or injected agents, skin and contour irregularities, skin discoloration and swelling, deep vein thrombosis, cardiac and pulmonary complications, pain, which may persist, persistent pain, recurrence of the  lesion, poor healing of the incision, possible  need for revisional surgery or staged procedures. Thiere can also be persistent swelling, poor wound healing, rippling or loose skin, worsening of cellulite, swelling, and thermal burn or heat injury from ultrasound with the ultrasound-assisted lipoplasty technique. Any change in weight fluctuations can alter the outcome.  The [place] was signed into law in 2016 which includes the topic of electronic health records.  This provides immediate access to information in MyChart.  This includes consultation notes, operative notes, office notes, lab results and pathology reports.  If you have any questions about what you read please let us know at your next visit or call us at the office.  We are right here with you.   Electronically signed by: Threasa Heads, PA-C 06/25/2020 2:03 PM

## 2020-06-24 NOTE — Progress Notes (Signed)
**Note De-Identified Christina Obfuscation** ICD-10-CM   1. Breast asymmetry following reconstructive surgery  N65.1   2. S/P breast reconstruction, bilateral  Z98.890       Patient ID: Christina [name], female    DOB: [birth year], 54 y.o.   MRN: [id number]   History of Present Illness: Christina [name] is a 54 y.o.  female  with a history of breast asymmetry following reconstructive surgery.  She presents for preoperative evaluation for upcoming procedure, lipofilling of bilateral breasts, scheduled for 06/28/2020 with Dr. Marla Roe.  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  Patient has Mentor ultra high profile 412 cc silicone implants in place bilaterally.  Implants were placed in February 2019.  Patient recently had an evaluation of her breasts Christina ultrasound which showed no signs of extracapsular silicone.  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  The patient has had problems with anesthesia. PONV. The scop patch works well for her.  Past Medical History: Allergies: No Known Allergies  Current Medications:  Current Outpatient Medications:  .  amitriptyline (ELAVIL) 100 MG tablet, Take 100 mg by mouth at bedtime., Disp: , Rfl:  .  citalopram (CELEXA) 20 MG tablet, Take 0.5 tablets (10 mg total) by mouth daily., Disp: , Rfl:  .  vitamin B-12 (CYANOCOBALAMIN) 500 MCG tablet, Take 1 tablet (500 mcg total) by mouth daily., Disp: , Rfl:  .  Vitamin D, Ergocalciferol, (DRISDOL) 1.25 MG (50000 UNIT) CAPS capsule, Take 1 capsule (50,000 Units total) by mouth every 7 (seven) days., Disp: 4 capsule, Rfl: 0 .  cephALEXin (KEFLEX) 500 MG capsule, Take 1 capsule (500 mg total) by mouth 4 (four) times daily for 3 days. For use AFTER Surgery, Disp: 12 capsule, Rfl: 0 .  FLUoxetine  (PROZAC) 10 MG tablet, Use one half tablet daily for 1 week then increase to 1 tablet daily (Patient not taking: Reported on 06/25/2020), Disp: 30 tablet, Rfl: 0 .  HYDROcodone-acetaminophen (NORCO) 5-325 MG tablet, Take 1 tablet by mouth every 8 (eight) hours as needed for up to 3 days for severe pain. For use AFTER Surgery, Disp: 9 tablet, Rfl: 0 .  ondansetron (ZOFRAN) 4 MG tablet, Take 1 tablet (4 mg total) by mouth every 8 (eight) hours as needed for nausea or vomiting., Disp: 20 tablet, Rfl: 0  Past Medical Problems: Past Medical History:  Diagnosis Date  . Anemia   . Arthritis    hands and knees  . 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 Depression   . GAD (generalized anxiety disorder)   . Gallbladder problem   . History of ovarian cyst   . IBS (irritable bowel syndrome)   . Joint pain   . PONV (postoperative nausea and vomiting)    does well with scop patch  . Pre-diabetes    followed by pcp  . Right knee meniscal tear   . Urgency of urination     Past Surgical History: Past Surgical History:  Procedure Laterality Date  . ABDOMINAL HYSTERECTOMY  05/1996   endometriosis  . ACHILLES TENDON REPAIR Right 2010;  revision 2011  . BLADDER SUSPENSION  2000  . BREAST BIOPSY Left  10/2015  . BREAST IMPLANT REMOVAL Bilateral 08/12/2017   Procedure: REMOVAL BILATERAL BREAST IMPLANTS;  Surgeon: Wallace Going, DO;  Location: [place];  Service: Plastics;  Laterality: Bilateral;  . BREAST RECONSTRUCTION WITH PLACEMENT OF TISSUE EXPANDER AND FLEX HD (ACELLULAR HYDRATED DERMIS) Bilateral 01/09/2016  . BREAST RECONSTRUCTION WITH PLACEMENT OF TISSUE EXPANDER AND FLEX HD (ACELLULAR HYDRATED DERMIS) Bilateral 01/09/2016   Procedure: BREAST RECONSTRUCTION WITH PLACEMENT OF TISSUE EXPANDER AND FLEX HD (ACELLULAR HYDRATED  DERMIS);  Surgeon: Loel Lofty Dillingham, DO;  Location: [place];  Service: Plastics;  Laterality: Bilateral;  . BREAST RECONSTRUCTION WITH PLACEMENT OF TISSUE EXPANDER AND FLEX HD (ACELLULAR HYDRATED DERMIS) Bilateral 05/29/2016   Procedure: PLACEMENT OF BILATERAL TISSUE EXPANDER AND FLEX HD (ACELLULAR HYDRATED DERMIS);  Surgeon: Wallace Going, DO;  Location: [place];  Service: Plastics;  Laterality: Bilateral;  . BREAST REDUCTION SURGERY Bilateral 11/26/2016   Procedure: BILATERAL BREAST CAPSULE CONTRACTURE RELASE;  Surgeon: Wallace Going, DO;  Location: [place];  Service: Plastics;  Laterality: Bilateral;  . [place]; 1989; 1991  . ELBOW SURGERY Right x3   last one 02-01-2019 @ [facility]  . FAT GRAFTING BILATERAL BREAST  08-09-2018  @[facility]   . INCISION AND DRAINAGE OF WOUND Bilateral 02/11/2016   Procedure: IRRIGATION AND DEBRIDEMENT OF BILATERAL BREAST POCKET;  Surgeon: Wallace Going, DO;  Location: [place];  Service: Plastics;  Laterality: Bilateral;  . KNEE ARTHROSCOPY WITH MEDIAL MENISECTOMY Right 12/20/2019   Procedure: KNEE ARTHROSCOPY WITH MEDIAL MENISECTOMY;  Surgeon: Marchia Bond, MD;  Location: [place];  Service: Orthopedics;  Laterality: Right;  . LAPAROSCOPIC APPENDECTOMY  04-07-2011   @[facility]    w/ Excision peritoneal lipoma and lysis adhesions  . LAPAROSCOPIC CHOLECYSTECTOMY  ~ 1999  . LIPOSUCTION WITH LIPOFILLING Bilateral 11/26/2016   Procedure: LIPOFILLING FOR SYMMETRY;  Surgeon: Wallace Going, DO;  Location: [place];  Service: Plastics;  Laterality: Bilateral;  . LIPOSUCTION WITH LIPOFILLING Bilateral 01/21/2017   Procedure: BILATERAL BREAST  LIPOFILLING FOR ASYMMETRY;  Surgeon: Wallace Going, DO;  Location: [place];  Service: Plastics;  Laterality: Bilateral;  . MASTECTOMY Bilateral 01/09/2016  . NIPPLE SPARING MASTECTOMY/SENTINAL LYMPH NODE  BIOPSY/RECONSTRUCTION/PLACEMENT OF TISSUE EXPANDER Bilateral 01/09/2016   Procedure: BILATERAL NIPPLE SPARING MASTECTOMY WITH LEFT SENTINAL LYMPH NODE BIOPSY ;  Surgeon: Stark Klein, MD;  Location: [place];  Service: General;  Laterality: Bilateral;  . REMOVAL OF BILATERAL TISSUE EXPANDERS WITH PLACEMENT OF BILATERAL BREAST IMPLANTS Bilateral 08/20/2016   Procedure: REMOVAL OF BILATERAL TISSUE EXPANDERS WITH PLACEMENT OF BILATERAL SILICONE IMPLANTS;  Surgeon: Wallace Going, DO;  Location: [place];  Service: Plastics;  Laterality: Bilateral;  . REMOVAL OF BILATERAL TISSUE EXPANDERS WITH PLACEMENT OF BILATERAL BREAST IMPLANTS Bilateral 11/05/2017   Procedure: REMOVAL OF BILATERAL TISSUE EXPANDERS WITH PLACEMENT OF BILATERAL BREAST SILICONE IMPLANTS;  Surgeon: Wallace Going, DO;  Location: [place];  Service: Plastics;  Laterality: Bilateral;  . REMOVAL OF TISSUE EXPANDER Bilateral 02/11/2016   Procedure: REMOVAL OF BILATERAL TISSUE EXPANDERS AND FLEX HD REMOVAL;  Surgeon: Wallace Going, DO;  Location: [place];  Service: Plastics;  Laterality: Bilateral;  . TISSUE EXPANDER PLACEMENT Bilateral 08/12/2017   Procedure: PLACEMENT OF BILATERAL TISSUE EXPANDER;  Surgeon: Wallace Going, DO;  Location: [place];  Service: Plastics;  Laterality: Bilateral;  . TUBAL LIGATION Bilateral 1991    Social History: Social  History   Socioeconomic History  . Marital status: Single    Spouse name: Not on file  . Number of children: Not on file  . Years of education: Not on file  . Highest education level: Not on file  Occupational History  . Occupation: Holiday representative: Education administrator  Tobacco Use  . Smoking status: Former Smoker    Packs/day: 1.00    Years: 10.00    Pack years: 10.00    Types: Cigarettes    Quit date: 06/08/2008    Years since quitting: 12.0  . Smokeless tobacco: Never Used  Vaping  Use  . Vaping Use: Never used  Substance and Sexual Activity  . Alcohol use: No  . Drug use: Never  . Sexual activity: Never    Birth control/protection: Surgical    Comment: hysterectomy  Other Topics Concern  . Not on file  Social History Narrative  . Not on file   Social Determinants of Health   Financial Resource Strain:   . Difficulty of Paying Living Expenses: Not on file  Food Insecurity:   . Worried About Charity fundraiser in the Last Year: Not on file  . Ran Out of Food in the Last Year: Not on file  Transportation Needs:   . Lack of Transportation (Medical): Not on file  . Lack of Transportation (Non-Medical): Not on file  Physical Activity:   . Days of Exercise per Week: Not on file  . Minutes of Exercise per Session: Not on file  Stress:   . Feeling of Stress : Not on file  Social Connections:   . Frequency of Communication with Friends and Family: Not on file  . Frequency of Social Gatherings with Friends and Family: Not on file  . Attends Religious Services: Not on file  . Active Member of Clubs or Organizations: Not on file  . Attends Archivist Meetings: Not on file  . Marital Status: Not on file  Intimate Partner Violence:   . Fear of Current or Ex-Partner: Not on file  . Emotionally Abused: Not on file  . Physically Abused: Not on file  . Sexually Abused: Not on file    Family History: Family History  Problem Relation Age of Onset  . Heart failure Father   . Prostate cancer Father 92  . Colon polyps Mother        approx 2  . Other Mother        hx HPV and hysterectomy due to precancerous cells  . Depression Mother   . Anxiety disorder Mother   . Other Sister 41       hx of hysterectomy for unspecified reason; still has ovaries  . Other Sister 42       paternal half-sister hx of hysterectomy for unspecified reason; still has ovaries  . Bladder Cancer Maternal Uncle 79       not a smoker  . Kidney failure Maternal Grandmother   .  Congestive Heart Failure Maternal Grandmother   . Colon cancer Maternal Grandmother 64  . Lung cancer Maternal Grandfather 28       smoker  . Breast cancer Paternal Grandmother        dx. early 25s; w/ hx of trauma to breast  . Crohn's disease Daughter   . Lung cancer Maternal Uncle 37       smoker    Review of Systems: Review of Systems  Constitutional: Negative for chills and fever.  HENT: Negative for congestion and sore throat.   Respiratory: Negative for cough and shortness of breath.   Cardiovascular: Negative for chest pain.  Gastrointestinal: Negative for abdominal pain, nausea and vomiting.  Skin: Negative for itching and rash.    Physical Exam: Vital Signs BP 117/64 (BP Location: Left Arm, Patient Position: Sitting, Cuff Size: Large)   Pulse 75   Temp 97.7 F (36.5 C) (Oral)   SpO2 98%  Physical Exam Vitals and nursing note reviewed.  Constitutional:      General: She is not in acute distress.    Appearance: Normal appearance. She is normal weight. She is not ill-appearing.  HENT:     Head: Normocephalic and atraumatic.  Eyes:     Extraocular Movements: Extraocular movements intact.  Cardiovascular:     Rate and Rhythm: Normal rate and regular rhythm.     Pulses: Normal pulses.     Heart sounds: Normal heart sounds.  Pulmonary:     Effort: Pulmonary effort is normal.     Breath sounds: Normal breath sounds. No wheezing, rhonchi or rales.  Abdominal:     General: Bowel sounds are normal.     Palpations: Abdomen is soft.  Musculoskeletal:        General: No swelling. Normal range of motion.     Cervical back: Normal range of motion.  Skin:    General: Skin is warm and dry.     Coloration: Skin is not pale.     Findings: No erythema or rash.  Neurological:     General: No focal deficit present.     Mental Status: She is alert and oriented to person, place, and time.  Psychiatric:        Mood and Affect: Mood normal.        Behavior: Behavior normal.         Thought Content: Thought content normal.        Judgment: Judgment normal.     Assessment/Plan:  Ms. Neises scheduled for lipofilling of bilateral breasts with Dr. Marla Roe.  Risks, benefits, and alternatives of procedure discussed, questions answered and consent obtained.    Smoking Status: Former smoker; Counseling Given? N/A Last Mammogram: N/A-bilateral mastectomies  Caprini Score: 6 high; Risk Factors include: 54 year old female, Hx cancer, BMI > 25, and length of planned surgery. Recommendation for mechanical and pharmacological prophylaxis during surgery. Encourage early ambulation.  Pictures obtained: 04/24/2020  Post-op Rx sent to pharmacy: Keflex, Zofran, Norco  Patient was provided with the General Surgical Risk consent document and Pain Medication Agreement prior to their appointment.  They had adequate time to read through the risk consent documents and Pain Medication Agreement. We also discussed them in person together during this preop appointment. All of their questions were answered to their satisfaction.  Recommended calling if they have any further questions.  Risk consent form and Pain Medication Agreement to be scanned into patient's chart.  The risks that can be encountered with and after liposuction were discussed and include the following but no limited to these:  Asymmetry, fluid accumulation, firmness of the area, fat necrosis with death of fat tissue, bleeding, infection, delayed healing, anesthesia risks, skin sensation changes, injury to structures including nerves, blood vessels, and muscles which may be temporary or permanent, allergies to tape, suture materials and glues, blood products, topical preparations or injected agents, skin and contour irregularities, skin discoloration and swelling, deep vein thrombosis, cardiac and pulmonary complications, pain, which may persist, persistent pain, recurrence of the  lesion, poor healing of the incision, possible  need for revisional surgery or staged procedures. Thiere can also be persistent swelling, poor wound healing, rippling or loose skin, worsening of cellulite, swelling, and thermal burn or heat injury from ultrasound with the ultrasound-assisted lipoplasty technique. Any change in weight fluctuations can alter the outcome.  The [place] was signed into law in 2016 which includes the topic of electronic health records.  This provides immediate access to information in MyChart.  This includes consultation notes, operative notes, office notes, lab results and pathology reports.  If you have any questions about what you read please let us know at your next visit or call us at the office.  We are right here with you.   Electronically signed by: Threasa Heads, PA-C 06/25/2020 2:03 PM

## 2020-06-25 ENCOUNTER — Other Ambulatory Visit: Payer: Self-pay

## 2020-06-25 ENCOUNTER — Ambulatory Visit (INDEPENDENT_AMBULATORY_CARE_PROVIDER_SITE_OTHER): Payer: 59 | Admitting: Plastic Surgery

## 2020-06-25 ENCOUNTER — Encounter: Payer: Self-pay | Admitting: Plastic Surgery

## 2020-06-25 ENCOUNTER — Other Ambulatory Visit (HOSPITAL_COMMUNITY): Payer: 59

## 2020-06-25 ENCOUNTER — Telehealth (INDEPENDENT_AMBULATORY_CARE_PROVIDER_SITE_OTHER): Payer: Self-pay

## 2020-06-25 ENCOUNTER — Inpatient Hospital Stay (HOSPITAL_COMMUNITY)
Admission: RE | Admit: 2020-06-25 | Discharge: 2020-06-25 | Disposition: A | Discharge status: Home / Self Care | Payer: 59 | Source: Ambulatory Visit

## 2020-06-25 VITALS — BP 117/64 | HR 75 | Temp 97.7°F

## 2020-06-25 DIAGNOSIS — Z9889 Other specified postprocedural states: Secondary | ICD-10-CM

## 2020-06-25 DIAGNOSIS — N651 Disproportion of reconstructed breast: Secondary | ICD-10-CM

## 2020-06-25 MED ORDER — ONDANSETRON HCL 4 MG PO TABS: 4.0000 mg | ORAL_TABLET | Freq: Three times a day (TID) | ORAL | 0 refills | Status: DC | PRN

## 2020-06-25 MED ORDER — CEPHALEXIN 500 MG PO CAPS: 500.0000 mg | ORAL_CAPSULE | Freq: Four times a day (QID) | ORAL | 0 refills | Status: AC

## 2020-06-25 MED ORDER — HYDROCODONE-ACETAMINOPHEN 5-325 MG PO TABS
1.0000 | ORAL_TABLET | Freq: Three times a day (TID) | ORAL | 0 refills | Status: AC | PRN
Start: 2020-06-25 — End: 2020-06-28

## 2020-06-25 NOTE — Telephone Encounter (Signed)
Pt called to get a refill on her prozac, pt uses [facility] at [address]

## 2020-06-26 ENCOUNTER — Telehealth (INDEPENDENT_AMBULATORY_CARE_PROVIDER_SITE_OTHER): Payer: Self-pay | Admitting: Family Medicine

## 2020-06-26 NOTE — Telephone Encounter (Signed)
Call to pharmacy again. Spoke with the pharmacist Herbie Baltimore, he needed the okay to over ride the pop up about the patients medications, prozac and citalopram.   Prescriptions will be ready to be picked up later today.  Call to the patient to let her know that her medications are being processed per the pharmacy.  Pt verbalized understanding, call ended.

## 2020-06-26 NOTE — Telephone Encounter (Signed)
Pt states her pharmacy still has not received prescription for Prozac.  She states it's been 2 wks trying to get.  Pt frustrated.  Please advise as soon as possible.  Thank you

## 2020-06-27 NOTE — Anesthesia Preprocedure Evaluation (Addendum)
Anesthesia Evaluation  Patient identified by MRN, date of birth, ID band Patient awake    Reviewed: Allergy & Precautions, NPO status , Patient's Chart, lab work & pertinent test results  History of Anesthesia Complications (+) PONV and history of anesthetic complications  Airway Mallampati: I  TM Distance: >3 FB Neck ROM: Full    Dental  (+) Dental Advisory Given, Missing, Partial Upper   Pulmonary former smoker,    Pulmonary exam normal breath sounds clear to auscultation       Cardiovascular Normal cardiovascular exam Rhythm:Regular Rate:Normal     Neuro/Psych PSYCHIATRIC DISORDERS Anxiety Depression    GI/Hepatic   Endo/Other    Renal/GU      Musculoskeletal  (+) Arthritis ,   Abdominal (+) + obese,   Peds  Hematology  (+) anemia ,   Anesthesia Other Findings   Reproductive/Obstetrics                            Anesthesia Physical  Anesthesia Plan  ASA: II  Anesthesia Plan: General   Post-op Pain Management:    Induction: Intravenous  PONV Risk Score and Plan: 4 or greater and Ondansetron, Midazolam, Scopolamine patch - Pre-op, Treatment may vary due to age or medical condition and Dexamethasone  Airway Management Planned: LMA  Additional Equipment: None  Intra-op Plan:   Post-operative Plan: Extubation in OR  Informed Consent: I have reviewed the patients History and Physical, chart, labs and discussed the procedure including the risks, benefits and alternatives for the proposed anesthesia with the patient or authorized representative who has indicated his/her understanding and acceptance.     Dental advisory given  Plan Discussed with: CRNA  Anesthesia Plan Comments:        Anesthesia Quick Evaluation

## 2020-06-28 ENCOUNTER — Ambulatory Visit (HOSPITAL_BASED_OUTPATIENT_CLINIC_OR_DEPARTMENT_OTHER): Payer: 59 | Admitting: Anesthesiology

## 2020-06-28 ENCOUNTER — Encounter (HOSPITAL_BASED_OUTPATIENT_CLINIC_OR_DEPARTMENT_OTHER): Payer: Self-pay | Admitting: Plastic Surgery

## 2020-06-28 ENCOUNTER — Ambulatory Visit (HOSPITAL_BASED_OUTPATIENT_CLINIC_OR_DEPARTMENT_OTHER)
Admission: RE | Admit: 2020-06-28 | Discharge: 2020-06-28 | Disposition: A | Discharge status: Home / Self Care | Payer: 59 | Attending: Plastic Surgery | Admitting: Plastic Surgery

## 2020-06-28 ENCOUNTER — Other Ambulatory Visit: Payer: Self-pay

## 2020-06-28 ENCOUNTER — Encounter (HOSPITAL_BASED_OUTPATIENT_CLINIC_OR_DEPARTMENT_OTHER)
Admission: RE | Disposition: A | Discharge status: Home / Self Care | Payer: Self-pay | Source: Home / Self Care | Attending: Plastic Surgery

## 2020-06-28 DIAGNOSIS — Z9013 Acquired absence of bilateral breasts and nipples: Secondary | ICD-10-CM | POA: Insufficient documentation

## 2020-06-28 DIAGNOSIS — Z853 Personal history of malignant neoplasm of breast: Secondary | ICD-10-CM | POA: Diagnosis not present

## 2020-06-28 DIAGNOSIS — M170 Bilateral primary osteoarthritis of knee: Secondary | ICD-10-CM | POA: Diagnosis not present

## 2020-06-28 DIAGNOSIS — F419 Anxiety disorder, unspecified: Secondary | ICD-10-CM | POA: Diagnosis not present

## 2020-06-28 DIAGNOSIS — Z87891 Personal history of nicotine dependence: Secondary | ICD-10-CM | POA: Diagnosis not present

## 2020-06-28 DIAGNOSIS — R7303 Prediabetes: Secondary | ICD-10-CM | POA: Diagnosis not present

## 2020-06-28 DIAGNOSIS — M19042 Primary osteoarthritis, left hand: Secondary | ICD-10-CM | POA: Diagnosis not present

## 2020-06-28 DIAGNOSIS — N651 Disproportion of reconstructed breast: Secondary | ICD-10-CM | POA: Insufficient documentation

## 2020-06-28 DIAGNOSIS — F32A Depression, unspecified: Secondary | ICD-10-CM | POA: Insufficient documentation

## 2020-06-28 DIAGNOSIS — Z9889 Other specified postprocedural states: Secondary | ICD-10-CM | POA: Diagnosis not present

## 2020-06-28 DIAGNOSIS — M19041 Primary osteoarthritis, right hand: Secondary | ICD-10-CM | POA: Diagnosis not present

## 2020-06-28 HISTORY — PX: LIPOSUCTION WITH LIPOFILLING: SHX6436

## 2020-06-28 SURGERY — LIPOSUCTION, WITH FAT TRANSFER
Anesthesia: General | Site: Breast | Laterality: Bilateral

## 2020-06-28 MED ORDER — ACETAMINOPHEN 325 MG RE SUPP: 650.0000 mg | RECTAL | Status: DC | PRN

## 2020-06-28 MED ORDER — LIDOCAINE HCL 1 % IJ SOLN
INTRAVENOUS | Status: DC | PRN
  Administered 2020-06-28: 800 mL

## 2020-06-28 MED ORDER — DIPHENHYDRAMINE HCL 50 MG/ML IJ SOLN
INTRAMUSCULAR | Status: DC | PRN
  Administered 2020-06-28: 6.25 mg via INTRAVENOUS

## 2020-06-28 MED ORDER — HYDROCODONE-ACETAMINOPHEN 7.5-325 MG PO TABS: 1.0000 | ORAL_TABLET | Freq: Once | ORAL | Status: DC | PRN

## 2020-06-28 MED ORDER — FENTANYL CITRATE (PF) 100 MCG/2ML IJ SOLN
INTRAMUSCULAR | Status: AC
  Filled 2020-06-28: qty 2

## 2020-06-28 MED ORDER — DIPHENHYDRAMINE HCL 50 MG/ML IJ SOLN
INTRAMUSCULAR | Status: AC
  Filled 2020-06-28: qty 1

## 2020-06-28 MED ORDER — MIDAZOLAM HCL 2 MG/2ML IJ SOLN
INTRAMUSCULAR | Status: AC
  Filled 2020-06-28: qty 2

## 2020-06-28 MED ORDER — ONDANSETRON HCL 4 MG/2ML IJ SOLN
INTRAMUSCULAR | Status: AC
  Filled 2020-06-28: qty 2

## 2020-06-28 MED ORDER — SCOPOLAMINE 1 MG/3DAYS TD PT72
MEDICATED_PATCH | TRANSDERMAL | Status: AC
  Filled 2020-06-28: qty 1

## 2020-06-28 MED ORDER — CEFAZOLIN SODIUM-DEXTROSE 2-4 GM/100ML-% IV SOLN
INTRAVENOUS | Status: AC
  Filled 2020-06-28: qty 100

## 2020-06-28 MED ORDER — BUPIVACAINE HCL (PF) 0.25 % IJ SOLN
INTRAMUSCULAR | Status: DC | PRN
  Administered 2020-06-28: 30 mL

## 2020-06-28 MED ORDER — LIDOCAINE 2% (20 MG/ML) 5 ML SYRINGE
INTRAMUSCULAR | Status: AC
  Filled 2020-06-28: qty 5

## 2020-06-28 MED ORDER — CEFAZOLIN SODIUM-DEXTROSE 2-4 GM/100ML-% IV SOLN
2.0000 g | INTRAVENOUS | Status: AC
  Administered 2020-06-28: 2 g via INTRAVENOUS

## 2020-06-28 MED ORDER — SODIUM CHLORIDE 0.9% FLUSH: 3.0000 mL | INTRAVENOUS | Status: DC | PRN

## 2020-06-28 MED ORDER — CHLORHEXIDINE GLUCONATE CLOTH 2 % EX PADS: 6.0000 | MEDICATED_PAD | Freq: Once | CUTANEOUS | Status: DC

## 2020-06-28 MED ORDER — PROPOFOL 500 MG/50ML IV EMUL
INTRAVENOUS | Status: AC
  Filled 2020-06-28: qty 50

## 2020-06-28 MED ORDER — MIDAZOLAM HCL 5 MG/5ML IJ SOLN
INTRAMUSCULAR | Status: DC | PRN
  Administered 2020-06-28: 2 mg via INTRAVENOUS

## 2020-06-28 MED ORDER — SCOPOLAMINE 1 MG/3DAYS TD PT72
1.0000 | MEDICATED_PATCH | TRANSDERMAL | Status: DC
  Administered 2020-06-28: 1.5 mg via TRANSDERMAL

## 2020-06-28 MED ORDER — PHENYLEPHRINE 40 MCG/ML (10ML) SYRINGE FOR IV PUSH (FOR BLOOD PRESSURE SUPPORT)
PREFILLED_SYRINGE | INTRAVENOUS | Status: AC
  Filled 2020-06-28: qty 10

## 2020-06-28 MED ORDER — DEXAMETHASONE SODIUM PHOSPHATE 10 MG/ML IJ SOLN
INTRAMUSCULAR | Status: AC
  Filled 2020-06-28: qty 1

## 2020-06-28 MED ORDER — PROMETHAZINE HCL 25 MG/ML IJ SOLN: 6.2500 mg | INTRAMUSCULAR | Status: DC | PRN

## 2020-06-28 MED ORDER — FENTANYL CITRATE (PF) 100 MCG/2ML IJ SOLN
INTRAMUSCULAR | Status: DC | PRN
  Administered 2020-06-28 (×3): 50 ug via INTRAVENOUS

## 2020-06-28 MED ORDER — HYDROMORPHONE HCL 1 MG/ML IJ SOLN: 0.2500 mg | INTRAMUSCULAR | Status: DC | PRN

## 2020-06-28 MED ORDER — DEXAMETHASONE SODIUM PHOSPHATE 4 MG/ML IJ SOLN
INTRAMUSCULAR | Status: DC | PRN
  Administered 2020-06-28: 10 mg via INTRAVENOUS

## 2020-06-28 MED ORDER — LIDOCAINE-EPINEPHRINE 1 %-1:100000 IJ SOLN
INTRAMUSCULAR | Status: DC | PRN
  Administered 2020-06-28: 20 mL

## 2020-06-28 MED ORDER — FENTANYL CITRATE (PF) 100 MCG/2ML IJ SOLN: 25.0000 ug | INTRAMUSCULAR | Status: DC | PRN

## 2020-06-28 MED ORDER — ACETAMINOPHEN 325 MG PO TABS: 650.0000 mg | ORAL_TABLET | ORAL | Status: DC | PRN

## 2020-06-28 MED ORDER — LIDOCAINE HCL (CARDIAC) PF 100 MG/5ML IV SOSY
PREFILLED_SYRINGE | INTRAVENOUS | Status: DC | PRN
  Administered 2020-06-28: 60 mg via INTRAVENOUS

## 2020-06-28 MED ORDER — SODIUM CHLORIDE 0.9 % IV SOLN: 250.0000 mL | INTRAVENOUS | Status: DC | PRN

## 2020-06-28 MED ORDER — ONDANSETRON HCL 4 MG/2ML IJ SOLN
INTRAMUSCULAR | Status: DC | PRN
  Administered 2020-06-28: 4 mg via INTRAVENOUS

## 2020-06-28 MED ORDER — SODIUM CHLORIDE 0.9% FLUSH: 3.0000 mL | Freq: Two times a day (BID) | INTRAVENOUS | Status: DC

## 2020-06-28 MED ORDER — OXYCODONE HCL 5 MG PO TABS: 5.0000 mg | ORAL_TABLET | ORAL | Status: DC | PRN

## 2020-06-28 MED ORDER — MEPERIDINE HCL 25 MG/ML IJ SOLN: 6.2500 mg | INTRAMUSCULAR | Status: DC | PRN

## 2020-06-28 MED ORDER — EPHEDRINE 5 MG/ML INJ
INTRAVENOUS | Status: AC
  Filled 2020-06-28: qty 10

## 2020-06-28 MED ORDER — LACTATED RINGERS IV SOLN: INTRAVENOUS | Status: DC

## 2020-06-28 MED ORDER — PROPOFOL 10 MG/ML IV BOLUS
INTRAVENOUS | Status: DC | PRN
  Administered 2020-06-28: 200 mg via INTRAVENOUS

## 2020-06-28 MED ORDER — SUCCINYLCHOLINE CHLORIDE 200 MG/10ML IV SOSY
PREFILLED_SYRINGE | INTRAVENOUS | Status: AC
  Filled 2020-06-28: qty 10

## 2020-06-28 SURGICAL SUPPLY — 51 items
ADH SKN CLS APL DERMABOND .7 (GAUZE/BANDAGES/DRESSINGS) ×1
BAG DRN INLT TBG SET TISS ACC (MISCELLANEOUS) ×1
BINDER ABDOMINAL  9 SM 30-45 (SOFTGOODS)
BINDER ABDOMINAL 10 UNV 27-48 (MISCELLANEOUS) IMPLANT
BINDER ABDOMINAL 12 SM 30-45 (SOFTGOODS) ×2 IMPLANT
BINDER ABDOMINAL 9 SM 30-45 (SOFTGOODS) IMPLANT
BINDER BREAST LRG (GAUZE/BANDAGES/DRESSINGS) IMPLANT
BINDER BREAST MEDIUM (GAUZE/BANDAGES/DRESSINGS) IMPLANT
BINDER BREAST XLRG (GAUZE/BANDAGES/DRESSINGS) ×2 IMPLANT
BINDER BREAST XXLRG (GAUZE/BANDAGES/DRESSINGS) IMPLANT
BLADE HEX COATED 2.75 (ELECTRODE) IMPLANT
BLADE SURG 15 STRL LF DISP TIS (BLADE) ×1 IMPLANT
BLADE SURG 15 STRL SS (BLADE) ×2
BNDG GAUZE ELAST 4 BULKY (GAUZE/BANDAGES/DRESSINGS) ×4 IMPLANT
COVER BACK TABLE 60X90IN (DRAPES) ×2 IMPLANT
COVER MAYO STAND STRL (DRAPES) ×2 IMPLANT
COVER WAND RF STERILE (DRAPES) IMPLANT
DECANTER SPIKE VIAL GLASS SM (MISCELLANEOUS) IMPLANT
DERMABOND ADVANCED (GAUZE/BANDAGES/DRESSINGS) ×1
DERMABOND ADVANCED .7 DNX12 (GAUZE/BANDAGES/DRESSINGS) ×1 IMPLANT
DRAPE LAPAROSCOPIC ABDOMINAL (DRAPES) ×2 IMPLANT
DRSG PAD ABDOMINAL 8X10 ST (GAUZE/BANDAGES/DRESSINGS) ×8 IMPLANT
ELECT REM PT RETURN 9FT ADLT (ELECTROSURGICAL) ×2
ELECTRODE REM PT RTRN 9FT ADLT (ELECTROSURGICAL) ×1 IMPLANT
EXTRACTOR CANIST REVOLVE STRL (CANNISTER) IMPLANT
GLOVE BIO SURGEON STRL SZ 6.5 (GLOVE) ×4 IMPLANT
GOWN STRL REUS W/ TWL LRG LVL3 (GOWN DISPOSABLE) ×2 IMPLANT
GOWN STRL REUS W/TWL LRG LVL3 (GOWN DISPOSABLE) ×4
IV LACTATED RINGERS 1000ML (IV SOLUTION) ×4 IMPLANT
LINER CANISTER 1000CC FLEX (MISCELLANEOUS) ×2 IMPLANT
NDL SAFETY ECLIPSE 18X1.5 (NEEDLE) ×1 IMPLANT
NEEDLE HYPO 18GX1.5 SHARP (NEEDLE) ×2
NEEDLE HYPO 25X1 1.5 SAFETY (NEEDLE) IMPLANT
PACK BASIN DAY SURGERY FS (CUSTOM PROCEDURE TRAY) ×2 IMPLANT
PAD ALCOHOL SWAB (MISCELLANEOUS) ×2 IMPLANT
PENCIL SMOKE EVACUATOR (MISCELLANEOUS) IMPLANT
SLEEVE SCD COMPRESS KNEE MED (MISCELLANEOUS) ×2 IMPLANT
SPONGE LAP 18X18 RF (DISPOSABLE) ×2 IMPLANT
SUT MNCRL AB 4-0 PS2 18 (SUTURE) IMPLANT
SUT MON AB 5-0 PS2 18 (SUTURE) ×4 IMPLANT
SYR 10ML LL (SYRINGE) ×8 IMPLANT
SYR 3ML 18GX1 1/2 (SYRINGE) IMPLANT
SYR 50ML LL SCALE MARK (SYRINGE) ×4 IMPLANT
SYR CONTROL 10ML LL (SYRINGE) ×2 IMPLANT
SYR TOOMEY 50ML (SYRINGE) ×4 IMPLANT
SYSTEM FAT FILTRATION 250 (MISCELLANEOUS) ×2 IMPLANT
TOWEL GREEN STERILE FF (TOWEL DISPOSABLE) ×4 IMPLANT
TRAY DSU PREP LF (CUSTOM PROCEDURE TRAY) ×2 IMPLANT
TUBING INFILTRATION IT-10001 (TUBING) IMPLANT
TUBING SET GRADUATE ASPIR 12FT (MISCELLANEOUS) ×2 IMPLANT
UNDERPAD 30X36 HEAVY ABSORB (UNDERPADS AND DIAPERS) ×4 IMPLANT

## 2020-06-28 NOTE — Transfer of Care (Signed)
Immediate Anesthesia Transfer of Care Note  Patient: Christina [name]  Procedure(s) Performed: Lipofilling bilateral breasts for asymmetry (Bilateral Breast)  Patient Location: PACU  Anesthesia Type:General  Level of Consciousness: sedated  Airway & Oxygen Therapy: Patient Spontanous Breathing and Patient connected to face mask oxygen  Post-op Assessment: Report given to RN and Post -op Vital signs reviewed and stable  Post vital signs: Reviewed and stable  Last Vitals:  Vitals Value Taken Time  BP    Temp    Pulse    Resp    SpO2      Last Pain:  Vitals:   06/28/20 0641  TempSrc: Oral  PainSc: 0-No pain         Complications: No complications documented.

## 2020-06-28 NOTE — Op Note (Signed)
DATE OF OPERATION: 06/28/2020  LOCATION: [facility]  PREOPERATIVE DIAGNOSIS: Bilateral Breast Asymmetry   POSTOPERATIVE DIAGNOSIS: Same  PROCEDURE: Lipo filling to bilateral breasts for symmetry  SURGEON: Lyndee Leo Sanger Aleza Pew, DO  ASSISTANT: Roetta Sessions, PA  EBL: 2 cc  CONDITION: Stable  COMPLICATIONS: None  INDICATION: The patient, Christina [name], is a 54 y.o. female born on 08-Apr-1966, is here for treatment of bilateral breast asymmetry after breast reconstruction for breast cancer.   PROCEDURE DETAILS:  The patient was seen prior to surgery and marked.  The IV antibiotics were given. The patient was taken to the operating room and given a general anesthetic. A standard time out was performed and all information was confirmed by those in the room. SCDs were placed.   The patient was prepped and draped on her breasts and abdomen.  Local with epinephrine was injected at the periumbilical site and inframammary fold on each breast.  A 15 blade was used to make a 3 mm incision at the above-mentioned areas.  Tumescent was then infused in the abdomen and right lateral breast.  After waiting several minutes for the local to take effect liposuction was performed in the abdomen.  The fat was collected and the pure graft bag.  The fat was then prepared according to the manufacture guidelines.  The fat was then prepared in 10 cc syringes.  The patient was placed in the sitting position.  The pickle knife was then used to release the scar contractures of the right breast in the medial aspect.  The fat was then injected for a total of 50 cc in the right breast.  The left breast superior aspect was injected with 15 cc of fat and the inframammary fold with 20 cc of fat.  All incisions were closed with a 5-0 Monocryl.  Dermabond was applied and the patient was placed in a breast binder and an abdominal binder. The patient was allowed to wake up and taken to recovery room in stable  condition at the end of the case. The family was notified at the end of the case.   The advanced practice practitioner (APP) assisted throughout the case.  The APP was essential in retraction and counter traction when needed to make the case progress smoothly.  This retraction and assistance made it possible to see the tissue plans for the procedure.  The assistance was needed for blood control, tissue re-approximation and assisted with closure of the incision site.

## 2020-06-28 NOTE — Discharge Instructions (Signed)
INSTRUCTIONS FOR AFTER SURGERY   You will likely have some questions about what to expect following your operation.  The following information will help you and your family understand what to expect when you are discharged from the hospital.  Following these guidelines will help ensure a smooth recovery and reduce risks of complications.  Postoperative instructions include information on: diet, wound care, medications and physical activity.  AFTER SURGERY Expect to go home after the procedure.  In some cases, you may need to spend one night in the hospital for observation.  DIET This surgery does not require a specific diet.  However, I have to mention that the healthier you eat the better your body can start healing. It is important to increasing your protein intake.  This means limiting the foods with added sugar.  Focus on fruits and vegetables and some meat. It is very important to drink water after your surgery.  If your urine is bright yellow, then it is concentrated, and you need to drink more water.  As a general rule after surgery, you should have 8 ounces of water every hour while awake.  If you find you are persistently nauseated or unable to take in liquids let us know.  NO TOBACCO USE or EXPOSURE.  This will slow your healing process and increase the risk of a wound.  WOUND CARE If you don't have a drain: You can shower the day after surgery.  Use fragrance free soap.  Dial, Dove, Ivory and Cetaphil are usually mild on the skin. If you have steri-strips / tape directly attached to your skin leave them in place. It is OK to get these wet.  No baths, pools or hot tubs for two weeks. We close your incision to leave the smallest and best-looking scar. No ointment or creams on your incisions until given the go ahead.  Especially not Neosporin (Too many skin reactions with this one).  A few weeks after surgery you can use Mederma and start massaging the scar. We ask you to wear your binder or  sports bra for the first 6 weeks around the clock, including while sleeping. This provides added comfort and helps reduce the fluid accumulation at the surgery site.  ACTIVITY No heavy lifting until cleared by the doctor.  It is OK to walk and climb stairs. In fact, moving your legs is very important to decrease your risk of a blood clot.  It will also help keep you from getting deconditioned.  Every 1 to 2 hours get up and walk for 5 minutes. This will help with a quicker recovery back to normal.  Let pain be your guide so you don't do too much.  NO, you cannot do the spring cleaning and don't plan on taking care of anyone else.  This is your time for TLC.   WORK Everyone returns to work at different times. As a rough guide, most people take at least 1 - 2 weeks off prior to returning to work. If you need documentation for your job, bring the forms to your postoperative follow up visit.  DRIVING Arrange for someone to bring you home from the hospital.  You may be able to drive a few days after surgery but not while taking any narcotics or valium.  BOWEL MOVEMENTS Constipation can occur after anesthesia and while taking pain medication.  It is important to stay ahead for your comfort.  We recommend taking Milk of Magnesia (2 tablespoons; twice a day) while taking the   pain pills.  SEROMA This is fluid your body tried to put in the surgical site.  This is normal but if it creates excessive pain and swelling let us know.  It usually decreases in a few weeks.  MEDICATIONS and PAIN CONTROL At your preoperative visit for you history and physical you were given the following medications: 1. An antibiotic: Start this medication when you get home and take according to the instructions on the bottle. 2. Zofran 4 mg:  This is to treat nausea and vomiting.  You can take this every 6 hours as needed and only if needed. 3. Norco (hydrocodone/acetaminophen) 5/325 mg:  This is only to be used after you have  taken the motrin or the tylenol. Every 8 hours as needed. Over the counter Medication to take: 4. Ibuprofen (Motrin) 600 mg:  Take this every 6 hours.  If you have additional pain then take 500 mg of the tylenol.  Only take the Norco after you have tried these two. 5. Miralax or stool softener of choice: Take this according to the bottle if you take the Norco.  WHEN TO CALL Call your surgeon's office if any of the following occur: . Fever 101 degrees F or greater . Excessive bleeding or fluid from the incision site. . Pain that increases over time without aid from the medications . Redness, warmth, or pus draining from incision sites . Persistent nausea or inability to take in liquids . Severe misshapen area that underwent the operation.   Post Anesthesia Home Care Instructions  Activity: Get plenty of rest for the remainder of the day. A responsible individual must stay with you for 24 hours following the procedure.  For the next 24 hours, DO NOT: -Drive a car -Operate machinery -Drink alcoholic beverages -Take any medication unless instructed by your physician -Make any legal decisions or sign important papers.  Meals: Start with liquid foods such as gelatin or soup. Progress to regular foods as tolerated. Avoid greasy, spicy, heavy foods. If nausea and/or vomiting occur, drink only clear liquids until the nausea and/or vomiting subsides. Call your physician if vomiting continues.  Special Instructions/Symptoms: Your throat may feel dry or sore from the anesthesia or the breathing tube placed in your throat during surgery. If this causes discomfort, gargle with warm salt water. The discomfort should disappear within 24 hours.  If you had a scopolamine patch placed behind your ear for the management of post- operative nausea and/or vomiting:  1. The medication in the patch is effective for 72 hours, after which it should be removed.  Wrap patch in a tissue and discard in the trash.  Wash hands thoroughly with soap and water. 2. You may remove the patch earlier than 72 hours if you experience unpleasant side effects which may include dry mouth, dizziness or visual disturbances. 3. Avoid touching the patch. Wash your hands with soap and water after contact with the patch.      

## 2020-06-28 NOTE — Anesthesia Postprocedure Evaluation (Signed)
Anesthesia Post Note  Patient: Christina [name]  Procedure(s) Performed: Lipofilling bilateral breasts for asymmetry (Bilateral Breast)     Patient location during evaluation: PACU Anesthesia Type: General Level of consciousness: sedated and patient cooperative Pain management: pain level controlled Vital Signs Assessment: post-procedure vital signs reviewed and stable Respiratory status: spontaneous breathing Cardiovascular status: stable Anesthetic complications: no   No complications documented.  Last Vitals:  Vitals:   06/28/20 0938 06/28/20 1002  BP: 119/74 120/67  Pulse: 71 67  Resp: 19 20  Temp:  36.7 C  SpO2: 99% 98%    Last Pain:  Vitals:   06/28/20 1002  TempSrc: Oral  PainSc: 0-No pain                 Nolon Nations

## 2020-06-28 NOTE — Anesthesia Procedure Notes (Signed)
Procedure Name: LMA Insertion Date/Time: 06/28/2020 7:34 AM Performed by: Willa Frater, CRNA Pre-anesthesia Checklist: Patient identified, Emergency Drugs available, Suction available and Patient being monitored Patient Re-evaluated:Patient Re-evaluated prior to induction Oxygen Delivery Method: Circle system utilized Preoxygenation: Pre-oxygenation with 100% oxygen Induction Type: IV induction Ventilation: Mask ventilation without difficulty LMA: LMA inserted LMA Size: 4.0 Number of attempts: 1 Airway Equipment and Method: Bite block Placement Confirmation: positive ETCO2 Tube secured with: Tape Dental Injury: Teeth and Oropharynx as per pre-operative assessment

## 2020-06-28 NOTE — Interval H&P Note (Signed)
History and Physical Interval Note:  06/28/2020 7:03 AM  Christina [name]  has presented today for surgery, with the diagnosis of Breast asymmetry, history of breast cancer.  The various methods of treatment have been discussed with the patient and family. After consideration of risks, benefits and other options for treatment, the patient has consented to  Procedure(s) with comments: Lipofilling bilateral breasts for asymmetry (Bilateral) - 90 min as a surgical intervention.  The patient's history has been reviewed, patient examined, no change in status, stable for surgery.  I have reviewed the patient's chart and labs.  Questions were answered to the patient's satisfaction.     Loel Lofty Christina [name]

## 2020-06-29 ENCOUNTER — Encounter (HOSPITAL_BASED_OUTPATIENT_CLINIC_OR_DEPARTMENT_OTHER): Payer: Self-pay | Admitting: Plastic Surgery

## 2020-07-02 ENCOUNTER — Encounter (INDEPENDENT_AMBULATORY_CARE_PROVIDER_SITE_OTHER): Payer: Self-pay | Admitting: Family Medicine

## 2020-07-02 ENCOUNTER — Other Ambulatory Visit: Payer: Self-pay

## 2020-07-02 ENCOUNTER — Ambulatory Visit (INDEPENDENT_AMBULATORY_CARE_PROVIDER_SITE_OTHER): Payer: 59 | Admitting: Family Medicine

## 2020-07-02 VITALS — BP 113/72 | HR 71 | Temp 98.2°F | Ht 68.0 in | Wt 210.0 lb

## 2020-07-02 DIAGNOSIS — Z9189 Other specified personal risk factors, not elsewhere classified: Secondary | ICD-10-CM

## 2020-07-02 DIAGNOSIS — F3289 Other specified depressive episodes: Secondary | ICD-10-CM

## 2020-07-02 DIAGNOSIS — R7303 Prediabetes: Secondary | ICD-10-CM

## 2020-07-02 DIAGNOSIS — E669 Obesity, unspecified: Secondary | ICD-10-CM

## 2020-07-02 DIAGNOSIS — F32A Depression, unspecified: Secondary | ICD-10-CM | POA: Insufficient documentation

## 2020-07-02 DIAGNOSIS — E559 Vitamin D deficiency, unspecified: Secondary | ICD-10-CM | POA: Diagnosis not present

## 2020-07-02 DIAGNOSIS — E66811 Obesity, class 1: Secondary | ICD-10-CM

## 2020-07-02 DIAGNOSIS — Z6832 Body mass index (BMI) 32.0-32.9, adult: Secondary | ICD-10-CM

## 2020-07-02 MED ORDER — METFORMIN HCL 500 MG PO TABS: 500.0000 mg | ORAL_TABLET | Freq: Every day | ORAL | 0 refills | Status: DC

## 2020-07-02 MED ORDER — FLUOXETINE HCL 10 MG PO TABS: ORAL_TABLET | ORAL | 0 refills | Status: DC

## 2020-07-02 MED ORDER — AMITRIPTYLINE HCL 100 MG PO TABS: 50.0000 mg | ORAL_TABLET | Freq: Every day | ORAL | Status: DC

## 2020-07-04 NOTE — Progress Notes (Signed)
Chief Complaint:   OBESITY Christina [name] is here to discuss her progress with her obesity treatment plan along with follow-up of her obesity related diagnoses. Christina [name] is on the Category 2 Plan and states she is following her eating plan approximately 80-90% of the time. Christina [name] states she is walking for 15 minutes 4 times per week.  Today's visit was #: 3 Starting weight: 218 lbs Starting date: 05/15/2020 Today's weight: 210 lbs Today's date: 07/02/2020 Total lbs lost to date: 8 lbs Total lbs lost since last in-office visit: 6 lbs Total weight loss percentage to date: -3.67%  Interim History: Christina [name] says that she stuck to the plan more over the past 2 weeks.  She skipped meals occasionally.  She endorses cravings, "with anything" (ice cream) and says she has a lot of other unhealthy foods at home.  She struggled more last week than in prior weeks with hunger, she says.  She was not weighing proteins or measuring vegetables.    Plan:  Start metformin 500 mg ever morning.   Assessment/Plan:   Meds ordered this encounter  Medications  . FLUoxetine (PROZAC) 10 MG tablet    Sig: 1 tablet daily    Dispense:  30 tablet    Refill:  0  . amitriptyline (ELAVIL) 100 MG tablet    Sig: Take 0.5 tablets (50 mg total) by mouth at bedtime.  . metFORMIN (GLUCOPHAGE) 500 MG tablet    Sig: Take 1 tablet (500 mg total) by mouth daily with breakfast.    Dispense:  30 tablet    Refill:  0    1. Prediabetes Christina [name] has a diagnosis of prediabetes based on her elevated HgA1c and was informed this puts her at greater risk of developing diabetes. She continues to work on diet and exercise to decrease her risk of diabetes. She denies nausea or hypoglycemia.  She endorses cravings/hunger, especially after work.    Plan:  Start metformin 500 mg, as per below.  Lab Results  Component Value Date   HGBA1C 6.3 (H) 05/15/2020   Lab Results  Component Value Date   INSULIN 34.7 (H) 05/15/2020   -Start  metFORMIN (GLUCOPHAGE) 500 MG tablet; Take 1 tablet (500 mg total) by mouth daily with breakfast.  Dispense: 30 tablet; Refill: 0  2. Vitamin D deficiency Christina [name] has a history of Vitamin D deficiency with resultant generalized fatigue as her primary symptom.     Last OV we started pt on vitamin D 50,000 IU weekly for this deficiency and she is tolerating it well without side-effect.  Most recent Vitamin D lab reviewed-  level: 21.2.  Plan:  - Discussed importance of vitamin D (as well as calcium) to their health and wellbeing.   - Educated pt that weight loss will likely improve availability of vitamin D, thus encouraged Christina [name] to continue with meal plan and their weight loss efforts to further improve this condition.  - I recommend pt continue weekly prescription vit D 50,000 IU - see script below- which pt agrees to after discussion of the risks and benefits of this medication.      - Informed patient this may be a lifelong thing, and she was encouraged to continue to take the medicine until told otherwise.    ->> We will need to monitor levels regularly (every 3-4 mo on average) to keep levels within normal limits.     3. Other depression, with emotional eating After last office visit, she started taking Prozac.  She has only been taking it for 2 days now.  Denies side effects.  She decreased her Celexa dose as well.  Plan:  Increase Prozac from 1/2 tablet daily to 1 tablet daily.  Cut Elavil dose in half from 100 mg at bedtime to 50 mg at bedtime.  Continue Celexa at 10 mg for now.  Will wean at next office visit.  She has an upcoming appointment with [facility] for counseling sometime in the near future.  -Increase FLUoxetine (PROZAC) 10 MG tablet; 1 tablet daily  Dispense: 30 tablet; Refill: 0 -Decrease amitriptyline (ELAVIL) 100 MG tablet; Take 0.5 tablets (50 mg total) by mouth at bedtime.  4. At risk for side effect of medication Christina [name] was given approximately 15 minutes of  drug side effect counseling today.  We discussed side effect possibility and risk versus benefits. Christina [name] agreed to the medication and will contact this office if these side effects are intolerable.  5. Class 1 obesity with serious comorbidity and body mass index (BMI) of 32.0 to 32.9 in adult, unspecified obesity type  Christina [name] is currently in the action stage of change. As such, her goal is to continue with weight loss efforts. She has agreed to the Category 2 Plan.   Exercise goals: Continue walking 4 days per week, but increase to 20-30 minutes for stress management.  Behavioral modification strategies: increasing lean protein intake, decreasing simple carbohydrates, meal planning and cooking strategies, keeping healthy foods in the home, emotional eating strategies and planning for success.  Christina [name] has agreed to follow-up with our clinic in 2 weeks. She was informed of the importance of frequent follow-up visits to maximize her success with intensive lifestyle modifications for her multiple health conditions.   Objective:   Blood pressure 113/72, pulse 71, temperature 98.2 F (36.8 C), height 5\' 8"  (1.727 m), weight 210 lb (95.3 kg), SpO2 99 %. Body mass index is 31.93 kg/m.  General: Cooperative, alert, well developed, in no acute distress. HEENT: Conjunctivae and lids unremarkable. Cardiovascular: Regular rhythm.  Lungs: Normal work of breathing. Neurologic: No focal deficits.   Lab Results  Component Value Date   CREATININE 0.71 05/15/2020   BUN 10 05/15/2020   NA 138 05/15/2020   K 4.7 05/15/2020   CL 105 05/15/2020   CO2 23 05/15/2020   Lab Results  Component Value Date   ALT 34 (H) 05/15/2020   AST 23 05/15/2020   ALKPHOS 52 05/15/2020   BILITOT 0.3 05/15/2020   Lab Results  Component Value Date   HGBA1C 6.3 (H) 05/15/2020   Lab Results  Component Value Date   INSULIN 34.7 (H) 05/15/2020   Lab Results  Component Value Date   TSH 1.730 05/15/2020   Lab  Results  Component Value Date   CHOL 125 05/15/2020   HDL 36 (L) 05/15/2020   LDLCALC 71 05/15/2020   TRIG 95 05/15/2020   Lab Results  Component Value Date   WBC 6.8 05/15/2020   HGB 13.8 05/15/2020   HCT 42.0 05/15/2020   MCV 87 05/15/2020   PLT 308 05/15/2020   Attestation Statements:   Reviewed by clinician on day of visit: allergies, medications, problem list, medical history, surgical history, family history, social history, and previous encounter notes.  I, Water quality scientist, CMA, am acting as Location manager for [employer], DO.  I have reviewed the above documentation for accuracy and completeness, and I agree with the above. Christina [name], D.O.  The 21st Century Cures Act was signed  into law in 2016 which includes the topic of electronic health records.  This provides immediate access to information in MyChart.  This includes consultation notes, operative notes, office notes, lab results and pathology reports.  If you have any questions about what you read please let us know at your next visit so we can discuss your concerns and take corrective action if need be.  We are right here with you.

## 2020-07-06 ENCOUNTER — Encounter: Payer: Self-pay | Admitting: Plastic Surgery

## 2020-07-06 ENCOUNTER — Ambulatory Visit (INDEPENDENT_AMBULATORY_CARE_PROVIDER_SITE_OTHER): Payer: 59 | Admitting: Plastic Surgery

## 2020-07-06 ENCOUNTER — Other Ambulatory Visit: Payer: Self-pay

## 2020-07-06 ENCOUNTER — Encounter: Payer: 59 | Admitting: Plastic Surgery

## 2020-07-06 VITALS — BP 113/79 | HR 72 | Temp 97.7°F

## 2020-07-06 DIAGNOSIS — Z9013 Acquired absence of bilateral breasts and nipples: Secondary | ICD-10-CM

## 2020-07-06 NOTE — Progress Notes (Signed)
The patient is a 54 year old female here for follow-up after undergoing fat injection and scar contracture release of her breast.  Overall she is doing extremely well.  There is a little bit of bruising and swelling in the lower abdomen and breast area.  This is as expected.  The patient is very pleased.  We did take pictures today with her permission and put them in her chart.  She does show an improvement in her overall contour.  I would like to see her back in a couple of weeks.  She knows if she has any questions or concerns to give [name] a call.

## 2020-07-16 ENCOUNTER — Other Ambulatory Visit: Payer: Self-pay

## 2020-07-16 ENCOUNTER — Ambulatory Visit (INDEPENDENT_AMBULATORY_CARE_PROVIDER_SITE_OTHER): Payer: 59 | Admitting: Physician Assistant

## 2020-07-16 ENCOUNTER — Encounter (INDEPENDENT_AMBULATORY_CARE_PROVIDER_SITE_OTHER): Payer: Self-pay | Admitting: Family Medicine

## 2020-07-16 VITALS — BP 100/65 | HR 70 | Temp 97.7°F | Ht 68.0 in | Wt 208.0 lb

## 2020-07-16 DIAGNOSIS — F3289 Other specified depressive episodes: Secondary | ICD-10-CM

## 2020-07-16 DIAGNOSIS — R7303 Prediabetes: Secondary | ICD-10-CM | POA: Diagnosis not present

## 2020-07-16 DIAGNOSIS — E559 Vitamin D deficiency, unspecified: Secondary | ICD-10-CM

## 2020-07-16 DIAGNOSIS — Z9189 Other specified personal risk factors, not elsewhere classified: Secondary | ICD-10-CM

## 2020-07-16 DIAGNOSIS — E669 Obesity, unspecified: Secondary | ICD-10-CM

## 2020-07-16 DIAGNOSIS — Z6831 Body mass index (BMI) 31.0-31.9, adult: Secondary | ICD-10-CM

## 2020-07-16 MED ORDER — VITAMIN D (ERGOCALCIFEROL) 1.25 MG (50000 UNIT) PO CAPS: 50000.0000 [IU] | ORAL_CAPSULE | ORAL | 0 refills | Status: DC

## 2020-07-17 NOTE — Progress Notes (Signed)
Chief Complaint:   OBESITY Christina [name] is here to discuss her progress with her obesity treatment plan along with follow-up of her obesity related diagnoses. Christina [name] is on the Category 2 Plan and states she is following her eating plan approximately 90% of the time. Christina [name] states she is walking 15 minutes 4 times per week.  Today's visit was #: 4 Starting weight: 218 lbs Starting date: 05/15/2020 Today's weight: 208 lbs Today's date: 07/16/2020 Total lbs lost to date: 10 Total lbs lost since last in-office visit: 2  Interim History: Christina [name] reports that she did not do well this past weekend. She reports being excessively hungry and is overeating food on her plan. She is not having cravings for anything in particular; she simply feels hungry much of the day.  Subjective:   Vitamin D deficiency.  Minyon is on prescription Vitamin D weekly. No nausea, vomiting, or muscle weakness. Last level was not at goal.   Ref. Range 05/15/2020 09:36  Vitamin D, 25-Hydroxy Latest Ref Range: 30.0 - 100.0 ng/mL 21.2 (L)   Prediabetes. [name] has a diagnosis of prediabetes based on her elevated HgA1c and was informed this puts her at greater risk of developing diabetes. She continues to work on diet and exercise to decrease her risk of diabetes. She denies nausea or hypoglycemia. [name] started metformin after her last visit, which she is tolerating well. No nausea, vomiting, or diarrhea. She does report polyphagia.  Lab Results  Component Value Date   HGBA1C 6.3 (H) 05/15/2020   Lab Results  Component Value Date   INSULIN 34.7 (H) 05/15/2020   Other depression, with emotional eating. Christina [name] is struggling with emotional eating and using food for comfort to the extent that it is negatively impacting her health. She has been working on behavior modification techniques to help reduce her emotional eating and has been somewhat successful. She shows no sign of suicidal or homicidal ideations. [name]  has decreased her dose of Celexa as directed and took her last 1/2 tablet yesterday. She feels okay on Prozac without side effects, but feels her mood has been up and down. She has only been on Prozac for 2 weeks.  At risk for diabetes mellitus.[name] is at higher than average risk for developing diabetes due to obesity.   Assessment/Plan:   Vitamin D deficiency. Low Vitamin D level contributes to fatigue and are associated with obesity, breast, and colon cancer. She was given a refill on her Vitamin D, Ergocalciferol, (DRISDOL) 1.25 MG (50000 UNIT) CAPS capsule every week #4 with 0 refills and will follow-up for routine testing of Vitamin D, at least 2-3 times per year to avoid over-replacement.   Prediabetes. Christina [name] will continue to work on weight loss, exercise, and decreasing simple carbohydrates to help decrease the risk of diabetes. Refill was given for metformin #30 with 0 refills.  Other depression, with emotional eating. Behavior modification techniques were discussed today to help [name] deal with her emotional/non-hunger eating behaviors.  Orders and follow up as documented in patient record. She will continue with Prozac as directed. She stopped Celexa today after taking 1/2 tablet for the last week.  At risk for diabetes mellitus. Christina [name] was given approximately 15 minutes of diabetes education and counseling today. We discussed intensive lifestyle modifications today with an emphasis on weight loss as well as increasing exercise and decreasing simple carbohydrates in her diet. We also reviewed medication options with an emphasis on risk versus benefit of those discussed.  Repetitive spaced learning was employed today to elicit superior memory formation and behavioral change.  Class 1 obesity with serious comorbidity and body mass index (BMI) of 31.0 to 31.9 in adult, unspecified obesity type.  Christina [name] is currently in the action stage of change. As such, her goal is to continue with  weight loss efforts. She has agreed to change plans and will now follow the Category 3 Plan.   Exercise goals: For substantial health benefits, adults should do at least 150 minutes (2 hours and 30 minutes) a week of moderate-intensity, or 75 minutes (1 hour and 15 minutes) a week of vigorous-intensity aerobic physical activity, or an equivalent combination of moderate- and vigorous-intensity aerobic activity. Aerobic activity should be performed in episodes of at least 10 minutes, and preferably, it should be spread throughout the week.  Behavioral modification strategies: no skipping meals and meal planning and cooking strategies.  Christina [name] has agreed to follow-up with our clinic in 2 weeks. She was informed of the importance of frequent follow-up visits to maximize her success with intensive lifestyle modifications for her multiple health conditions.   Objective:   Blood pressure 100/65, pulse 70, temperature 97.7 F (36.5 C), height 5\' 8"  (1.727 m), weight 208 lb (94.3 kg), SpO2 99 %. Body mass index is 31.63 kg/m.  General: Cooperative, alert, well developed, in no acute distress. HEENT: Conjunctivae and lids unremarkable. Cardiovascular: Regular rhythm.  Lungs: Normal work of breathing. Neurologic: No focal deficits.   Lab Results  Component Value Date   CREATININE 0.71 05/15/2020   BUN 10 05/15/2020   NA 138 05/15/2020   K 4.7 05/15/2020   CL 105 05/15/2020   CO2 23 05/15/2020   Lab Results  Component Value Date   ALT 34 (H) 05/15/2020   AST 23 05/15/2020   ALKPHOS 52 05/15/2020   BILITOT 0.3 05/15/2020   Lab Results  Component Value Date   HGBA1C 6.3 (H) 05/15/2020   Lab Results  Component Value Date   INSULIN 34.7 (H) 05/15/2020   Lab Results  Component Value Date   TSH 1.730 05/15/2020   Lab Results  Component Value Date   CHOL 125 05/15/2020   HDL 36 (L) 05/15/2020   LDLCALC 71 05/15/2020   TRIG 95 05/15/2020   Lab Results  Component Value Date   WBC  6.8 05/15/2020   HGB 13.8 05/15/2020   HCT 42.0 05/15/2020   MCV 87 05/15/2020   PLT 308 05/15/2020   No results found for: IRON, TIBC, FERRITIN  Attestation Statements:   Reviewed by clinician on day of visit: allergies, medications, problem list, medical history, surgical history, family history, social history, and previous encounter notes.  IMichaelene Song, am acting as transcriptionist for Abby Potash, PA-C   I have reviewed the above documentation for accuracy and completeness, and I agree with the above. Abby Potash, PA-C

## 2020-07-19 ENCOUNTER — Encounter: Payer: Self-pay | Admitting: Surgical

## 2020-07-19 NOTE — Progress Notes (Deleted)
Patient is a 54 year old female here for follow-up after Lipo filling of bilateral breast for asymmetry and scar contracture release on 06/28/2020 with Dr. Marla Roe.

## 2020-07-20 ENCOUNTER — Encounter: Payer: 59 | Admitting: Surgical

## 2020-07-20 DIAGNOSIS — Z9889 Other specified postprocedural states: Secondary | ICD-10-CM

## 2020-07-20 DIAGNOSIS — Z9013 Acquired absence of bilateral breasts and nipples: Secondary | ICD-10-CM

## 2020-07-30 ENCOUNTER — Other Ambulatory Visit: Payer: Self-pay

## 2020-07-30 ENCOUNTER — Encounter (INDEPENDENT_AMBULATORY_CARE_PROVIDER_SITE_OTHER): Payer: Self-pay | Admitting: Family Medicine

## 2020-07-30 ENCOUNTER — Ambulatory Visit (INDEPENDENT_AMBULATORY_CARE_PROVIDER_SITE_OTHER): Payer: 59 | Admitting: Physician Assistant

## 2020-07-30 ENCOUNTER — Ambulatory Visit (INDEPENDENT_AMBULATORY_CARE_PROVIDER_SITE_OTHER): Payer: 59 | Admitting: Family Medicine

## 2020-07-30 VITALS — BP 114/75 | HR 76 | Temp 98.2°F | Ht 68.0 in | Wt 202.0 lb

## 2020-07-30 DIAGNOSIS — Z6830 Body mass index (BMI) 30.0-30.9, adult: Secondary | ICD-10-CM

## 2020-07-30 DIAGNOSIS — Z9189 Other specified personal risk factors, not elsewhere classified: Secondary | ICD-10-CM | POA: Diagnosis not present

## 2020-07-30 DIAGNOSIS — F3289 Other specified depressive episodes: Secondary | ICD-10-CM

## 2020-07-30 DIAGNOSIS — R7303 Prediabetes: Secondary | ICD-10-CM

## 2020-07-30 DIAGNOSIS — E669 Obesity, unspecified: Secondary | ICD-10-CM

## 2020-07-30 DIAGNOSIS — E559 Vitamin D deficiency, unspecified: Secondary | ICD-10-CM | POA: Diagnosis not present

## 2020-07-30 MED ORDER — FLUOXETINE HCL 20 MG PO TABS: ORAL_TABLET | ORAL | 0 refills | Status: DC

## 2020-07-30 MED ORDER — METFORMIN HCL 500 MG PO TABS: 500.0000 mg | ORAL_TABLET | Freq: Two times a day (BID) | ORAL | 0 refills | Status: DC

## 2020-08-06 NOTE — Progress Notes (Signed)
Chief Complaint:   OBESITY Christina [name] is here to discuss her progress with her obesity treatment plan along with follow-up of her obesity related diagnoses. Christina [name] is on the Category 3 Plan and states she is following her eating plan approximately 85-90% of the time. Christina [name] states she is walking 15 minutes 4 times per week.  Today's visit was #: 5 Starting weight: 218 lbs Starting date: 05/15/2020 Today's weight: 202 lbs Today's date: 07/30/2020 Total lbs lost to date: 16 lbs Total lbs lost since last in-office visit: 6 lbs Total weight loss percentage to date: -7.34%  Interim History: Lanise increased from Category 2 to Category 3 at her last office visit, due to increased hunger. She feels better but is still hungry around 11:30 AM.  Assessment/Plan:   1. Pre-diabetes Christina [name] is having less sweet cravings, better than before. She is tolerating Metformin well with no side effects. Christina [name] has some sugar cravings in early evenings.  Plan: Christina [name] will increase Metformin from 1 tablet by mouth daily to twice a day.  - I reiterated and again counseled patient on pathophysiology of the disease process of Pre-DM.    - Stressed importance of dietary and lifestyle modifications resulting in weight loss as first line txmnt - in addition we discussed the risks and benefits of metformin and various other medication options which can help [name] in the management of this disease process. Will consider them again in the future since pt wishes to not start medications at this time - continue to decrease simple carbs; increase fiber and proteins -> follow meal plan  - handouts provided at pt's request after education provided.  All concerns/questions addressed.   - anticipatory guidance given.   - Recheck A1c and fasting insulin level in approximately 3 months from last check or as deemed fit.   Refill and increase dose- metFORMIN (GLUCOPHAGE) 500 MG tablet; Take 1 tablet (500 mg total) by mouth 2 (two)  times daily with a meal.  Dispense: 60 tablet; Refill: 0    2. Vitamin D deficiency Low Vitamin D level contributes to fatigue and are associated with obesity, breast, and colon cancer. She agrees to continue to take prescription Vitamin D @50 ,000 IU every week and will follow-up for routine testing of Vitamin D, at least 2-3 times per year to avoid over-replacement.  Plan:  - Reiterated importance of vitamin D (as well as calcium) to their health and wellbeing.  - Reminded Christina [name] that weight loss will likely improve availability of vitamin D, thus encouraged her to continue with meal plan and their weight loss efforts to further improve this condition. - I recommend patient continue to take weekly prescription vit D 50,000 IU - Informed patient this may be a lifelong thing, and she was encouraged to continue to take the medicine until told otherwise.   - we will need to monitor levels regularly (every 3-4 mo on average) to keep levels within normal limits.  - weight loss will likely improve availability of vitamin D, thus encouraged Christina [name] to continue with meal plan and their weight loss efforts to further improve this condition - pt's questions and concerns regarding this condition addressed.      3. Other depression, with emotional eating Christina [name] and her husband are having marital discourse. She has increased stress and she has been off Celexa for 2 weeks now and is on Prozac 1 tablet daily. She is tolerating it well and feels much better now than her last office  visit. Christina [name] is having less emotional eating and is much less depressed, and has a longer fuse before she cries now.   Plan: We will refill Prozac but increase from 10 mg to 20 mg daily. Emotional eating strategies were discussed with Christina [name] today and a handout was provided.  Refill and increase dose- FLUoxetine (PROZAC) 20 MG tablet; 1 tablet daily  Dispense: 30 tablet; Refill: 0    4. At risk for diabetes  mellitus Christina [name] was given approximately 10 minutes of diabetes education and counseling today. We discussed intensive lifestyle modifications today with an emphasis on weight loss as well as increasing exercise and decreasing simple carbohydrates in her diet. We also reviewed medication options with an emphasis on risk versus benefit of those discussed.   5. Class 1 obesity with serious comorbidity and body mass index (BMI) of 30.0 to 30.9 in adult, unspecified obesity type Christina [name] is currently in the action stage of change. As such, her goal is to continue with weight loss efforts. She has agreed to the Category 3 Plan.   Exercise goals:  As is but Increase, if possible.  Behavioral modification strategies: meal planning and cooking strategies, holiday eating strategies , celebration eating strategies and planning for success.  Christina [name] has agreed to follow-up with our clinic in 2 weeks. She was informed of the importance of frequent follow-up visits to maximize her success with intensive lifestyle modifications for her multiple health conditions.    Objective:   Blood pressure 114/75, pulse 76, temperature 98.2 F (36.8 C), height 5\' 8"  (1.727 m), weight 202 lb (91.6 kg), SpO2 98 %. Body mass index is 30.71 kg/m.  General: Cooperative, alert, well developed, in no acute distress. HEENT: Conjunctivae and lids unremarkable. Cardiovascular: Regular rhythm.  Lungs: Normal work of breathing. Neurologic: No focal deficits.   Lab Results  Component Value Date   CREATININE 0.71 05/15/2020   BUN 10 05/15/2020   NA 138 05/15/2020   K 4.7 05/15/2020   CL 105 05/15/2020   CO2 23 05/15/2020   Lab Results  Component Value Date   ALT 34 (H) 05/15/2020   AST 23 05/15/2020   ALKPHOS 52 05/15/2020   BILITOT 0.3 05/15/2020   Lab Results  Component Value Date   HGBA1C 6.3 (H) 05/15/2020   Lab Results  Component Value Date   INSULIN 34.7 (H) 05/15/2020   Lab Results  Component Value Date    TSH 1.730 05/15/2020   Lab Results  Component Value Date   CHOL 125 05/15/2020   HDL 36 (L) 05/15/2020   LDLCALC 71 05/15/2020   TRIG 95 05/15/2020   Lab Results  Component Value Date   WBC 6.8 05/15/2020   HGB 13.8 05/15/2020   HCT 42.0 05/15/2020   MCV 87 05/15/2020   PLT 308 05/15/2020   Attestation Statements:   Reviewed by clinician on day of visit: allergies, medications, problem list, medical history, surgical history, family history, social history, and previous encounter notes.  Coral Ceo, am acting as Location manager for [employer], DO.  I have reviewed the above documentation for accuracy and completeness, and I agree with the above. Marjory Sneddon, D.O.  The [place] was signed into law in 2016 which includes the topic of electronic health records.  This provides immediate access to information in MyChart.  This includes consultation notes, operative notes, office notes, lab results and pathology reports.  If you have any questions about what you read please let  us know at your next visit so we can discuss your concerns and take corrective action if need be.  We are right here with you.

## 2020-08-07 ENCOUNTER — Encounter: Payer: 59 | Admitting: Plastic Surgery

## 2020-08-15 ENCOUNTER — Encounter (INDEPENDENT_AMBULATORY_CARE_PROVIDER_SITE_OTHER): Payer: Self-pay | Admitting: Family Medicine

## 2020-08-15 ENCOUNTER — Ambulatory Visit (INDEPENDENT_AMBULATORY_CARE_PROVIDER_SITE_OTHER): Payer: 59 | Admitting: Family Medicine

## 2020-08-15 ENCOUNTER — Other Ambulatory Visit: Payer: Self-pay

## 2020-08-15 VITALS — BP 113/70 | HR 72 | Temp 97.0°F | Ht 68.0 in | Wt 201.0 lb

## 2020-08-15 DIAGNOSIS — F3289 Other specified depressive episodes: Secondary | ICD-10-CM | POA: Diagnosis not present

## 2020-08-15 DIAGNOSIS — Z9189 Other specified personal risk factors, not elsewhere classified: Secondary | ICD-10-CM | POA: Insufficient documentation

## 2020-08-15 DIAGNOSIS — R7303 Prediabetes: Secondary | ICD-10-CM | POA: Diagnosis not present

## 2020-08-15 DIAGNOSIS — Z6830 Body mass index (BMI) 30.0-30.9, adult: Secondary | ICD-10-CM

## 2020-08-15 DIAGNOSIS — E559 Vitamin D deficiency, unspecified: Secondary | ICD-10-CM

## 2020-08-15 DIAGNOSIS — E669 Obesity, unspecified: Secondary | ICD-10-CM

## 2020-08-15 MED ORDER — FLUOXETINE HCL 20 MG PO TABS: ORAL_TABLET | ORAL | 0 refills | Status: DC

## 2020-08-15 MED ORDER — METFORMIN HCL 500 MG PO TABS: 500.0000 mg | ORAL_TABLET | Freq: Two times a day (BID) | ORAL | 0 refills | Status: DC

## 2020-08-15 MED ORDER — VITAMIN D (ERGOCALCIFEROL) 1.25 MG (50000 UNIT) PO CAPS: 50000.0000 [IU] | ORAL_CAPSULE | ORAL | 0 refills | Status: DC

## 2020-08-15 NOTE — Progress Notes (Signed)
Chief Complaint:   OBESITY Christina [name] is here to discuss her progress with her obesity treatment plan along with follow-up of her obesity related diagnoses. Christina [name] is on the Category 3 Plan and states she is following her eating plan approximately 95% of the time. Christina [name] states she is walking 15 minutes 3-4 times per week.  Today's visit was #: 6 Starting weight: 218 lbs Starting date: 05/15/2020 Today's weight: 201 lbs Today's date: 08/21/2020 Total lbs lost to date: 17 lbs Total lbs lost since last in-office visit: 1 lb Total weight loss percentage to date: -7.08%  Interim History: Christina [name] reports that things are going well with the program, but she is not happy with herself that she only lost 1 lb. She denies issues with foods/meal plan. Her hunger and cravings are controlled. She report some struggles with emotions a little bit but things are improving since we increased dose of Prozac at last OV.Christina [name] is still not eating all foods on the plan and she skips meals.  Assessment/Plan:   Meds ordered this encounter  Medications  . FLUoxetine (PROZAC) 20 MG tablet    Sig: 1 tablet daily    Dispense:  30 tablet    Refill:  0    Capsule is fine as well.  Whichever insurance prefers  . Vitamin D, Ergocalciferol, (DRISDOL) 1.25 MG (50000 UNIT) CAPS capsule    Sig: Take 1 capsule (50,000 Units total) by mouth every 7 (seven) days.    Dispense:  4 capsule    Refill:  0  . metFORMIN (GLUCOPHAGE) 500 MG tablet    Sig: Take 1 tablet (500 mg total) by mouth 2 (two) times daily with a meal.    Dispense:  60 tablet    Refill:  0     1. Pre-diabetes At Christina [name]'s last OV, we increased her Metformin from QD to BID. She started twice a day 5-6 days ago and is tolerating it well with no side effects. She notes that it helps with sweet cravngs and hunger overall.  Christina [name] has a diagnosis of prediabetes based on her elevated HgA1c and was informed this puts her at greater risk of developing  diabetes. She continues to work on diet and exercise to decrease her risk of diabetes. She denies nausea or hypoglycemia.  Lab Results  Component Value Date   HGBA1C 6.3 (H) 05/15/2020   Lab Results  Component Value Date   INSULIN 34.7 (H) 05/15/2020   Plan: Refill Metformin at current dose for 1 month, as per below. Christina [name] will continue to work on weight loss, exercise, and decreasing simple carbohydrates to help decrease the risk of diabetes.   Refill- metFORMIN (GLUCOPHAGE) 500 MG tablet; Take 1 tablet (500 mg total) by mouth 2 (two) times daily with a meal.  Dispense: 60 tablet; Refill: 0  2. Vitamin D deficiency Christina [name]'s Vitamin D level was 21.2 on 05/15/2020. She is currently taking prescription vitamin D 50,000 IU each week. She denies nausea, vomiting or muscle weakness.  Plan: Refill Vit D for 1 month, as per below. Low Vitamin D level contributes to fatigue and are associated with obesity, breast, and colon cancer. She agrees to continue to take prescription Vitamin D @50 ,000 IU every week and will follow-up for routine testing of Vitamin D, at least 2-3 times per year to avoid over-replacement.  Refill- Vitamin D, Ergocalciferol, (DRISDOL) 1.25 MG (50000 UNIT) CAPS capsule; Take 1 capsule (50,000 Units total) by mouth every 7 (seven) days.  Dispense: 4  capsule; Refill: 0  3. Other depression, with emotional eating We increased Christina [name]'s Prozac from 10 mg to 20 mg. She is doing well and feeling better with the increase. Christina [name] denies issues.  Plan: Refill Prozac for 1 month, as per below. Continue current treatment plan.  Refill- FLUoxetine (PROZAC) 20 MG tablet; 1 tablet daily  Dispense: 30 tablet; Refill: 0  4. At risk for impaired metabolic function Christina [name] was given approximately 28 minutes of impaired metabolic function prevention counseling today. We discussed intensive lifestyle modifications today with an emphasis on specific nutrition and exercise instructions and  strategies.   5. Class 1 obesity with serious comorbidity and body mass index (BMI) of 30.0 to 30.9 in adult, unspecified obesity type Christina [name] is currently in the action stage of change. As such, her goal is to continue with weight loss efforts. She has agreed to the Category 3 Plan.   Exercise goals: Increase to 20 minutes 5 days a week  Behavioral modification strategies: increasing lean protein intake, decreasing simple carbohydrates, no skipping meals, meal planning and cooking strategies, holiday eating strategies and planning for success.  [name] has agreed to follow-up with our clinic in 2-4 weeks. She was informed of the importance of frequent follow-up visits to maximize her success with intensive lifestyle modifications for her multiple health conditions.   Objective:   Blood pressure 113/70, pulse 72, temperature (!) 97 F (36.1 C), height 5\' 8"  (1.727 m), weight 201 lb (91.2 kg), SpO2 97 %. Body mass index is 30.56 kg/m.  General: Cooperative, alert, well developed, in no acute distress. HEENT: Conjunctivae and lids unremarkable. Cardiovascular: Regular rhythm.  Lungs: Normal work of breathing. Neurologic: No focal deficits.   Lab Results  Component Value Date   CREATININE 0.71 05/15/2020   BUN 10 05/15/2020   NA 138 05/15/2020   K 4.7 05/15/2020   CL 105 05/15/2020   CO2 23 05/15/2020   Lab Results  Component Value Date   ALT 34 (H) 05/15/2020   AST 23 05/15/2020   ALKPHOS 52 05/15/2020   BILITOT 0.3 05/15/2020   Lab Results  Component Value Date   HGBA1C 6.3 (H) 05/15/2020   Lab Results  Component Value Date   INSULIN 34.7 (H) 05/15/2020   Lab Results  Component Value Date   TSH 1.730 05/15/2020   Lab Results  Component Value Date   CHOL 125 05/15/2020   HDL 36 (L) 05/15/2020   LDLCALC 71 05/15/2020   TRIG 95 05/15/2020   Lab Results  Component Value Date   WBC 6.8 05/15/2020   HGB 13.8 05/15/2020   HCT 42.0 05/15/2020   MCV 87 05/15/2020    PLT 308 05/15/2020   Attestation Statements:   Reviewed by clinician on day of visit: allergies, medications, problem list, medical history, surgical history, family history, social history, and previous encounter notes.  Coral Ceo, am acting as Location manager for [employer], DO.  I have reviewed the above documentation for accuracy and completeness, and I agree with the above. Marjory Sneddon, D.O.  The [place] was signed into law in 2016 which includes the topic of electronic health records.  This provides immediate access to information in MyChart.  This includes consultation notes, operative notes, office notes, lab results and pathology reports.  If you have any questions about what you read please let us know at your next visit so we can discuss your concerns and take corrective action if need be.  We  are right here with you.

## 2020-08-28 ENCOUNTER — Other Ambulatory Visit: Payer: Self-pay

## 2020-08-28 ENCOUNTER — Encounter: Payer: Self-pay | Admitting: Plastic Surgery

## 2020-08-28 ENCOUNTER — Ambulatory Visit (INDEPENDENT_AMBULATORY_CARE_PROVIDER_SITE_OTHER): Payer: 59 | Admitting: Plastic Surgery

## 2020-08-28 VITALS — BP 125/74 | HR 75 | Temp 97.9°F

## 2020-08-28 DIAGNOSIS — Z9013 Acquired absence of bilateral breasts and nipples: Secondary | ICD-10-CM

## 2020-08-28 NOTE — Progress Notes (Signed)
   Subjective:    Patient ID: Christina [name], female    DOB: 1966/05/17, 54 y.o.   MRN: [id number]  The patient is a 54 year old female here for follow-up on her bilateral breast reconstruction.  She had bilateral mastectomies in 2017 for left breast cancer.  She had complications and had to have the expanders removed.  Several months later they were replaced.  She was able to go on for exchange to implants December 2017.  The implants had complications and she had to have them removed.  She had expanders placed again in 2018.  In 2019 she underwent exchange with placement of [place] smooth round ultra high profile 361 cc silicone implants.  She had contracture release and fat grafting in October 2021.  Since then she has been doing extremely well.  There is no sign of infection or swelling.  All incisions are very nicely healed.  She has good contour and good shape.  She is very pleased with her results.     Review of Systems  Constitutional: Negative.   HENT: Negative.   Eyes: Negative.   Respiratory: Negative.   Cardiovascular: Negative.   Gastrointestinal: Negative.   Genitourinary: Negative.   Musculoskeletal: Negative.   Hematological: Negative.   Psychiatric/Behavioral: Negative.        Objective:   Physical Exam Vitals and nursing note reviewed.  Constitutional:      Appearance: Normal appearance.  HENT:     Head: Normocephalic and atraumatic.  Cardiovascular:     Rate and Rhythm: Normal rate.     Pulses: Normal pulses.  Pulmonary:     Effort: Pulmonary effort is normal.  Abdominal:     General: Abdomen is flat.  Skin:    General: Skin is warm.     Capillary Refill: Capillary refill takes less than 2 seconds.  Neurological:     General: No focal deficit present.     Mental Status: She is alert and oriented to person, place, and time.  Psychiatric:        Mood and Affect: Mood normal.        Behavior: Behavior normal.         Assessment & Plan:     ICD-10-CM    1. 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  Follow-up in 1 year.  We also talked about ultrasound if needed or if she was ever concerned about anything.  Recommendation is for every 3 to 4 years.  She feels very comfortable right now and does not want to do anything.  I think that is totally fine.  I'm so glad she is doing well I will see her back in 1 year.  Pictures were obtained of the patient and placed in the chart with the patient's or guardian's permission.

## 2020-09-10 ENCOUNTER — Encounter (INDEPENDENT_AMBULATORY_CARE_PROVIDER_SITE_OTHER): Payer: Self-pay

## 2020-09-10 ENCOUNTER — Other Ambulatory Visit: Payer: Self-pay

## 2020-09-10 ENCOUNTER — Ambulatory Visit (INDEPENDENT_AMBULATORY_CARE_PROVIDER_SITE_OTHER): Payer: 59 | Admitting: Family Medicine

## 2020-09-19 ENCOUNTER — Encounter (INDEPENDENT_AMBULATORY_CARE_PROVIDER_SITE_OTHER): Payer: Self-pay

## 2020-09-24 ENCOUNTER — Encounter (INDEPENDENT_AMBULATORY_CARE_PROVIDER_SITE_OTHER): Payer: Self-pay | Admitting: Family Medicine

## 2020-09-24 ENCOUNTER — Telehealth (INDEPENDENT_AMBULATORY_CARE_PROVIDER_SITE_OTHER): Payer: 59 | Admitting: Family Medicine

## 2020-09-24 ENCOUNTER — Telehealth (INDEPENDENT_AMBULATORY_CARE_PROVIDER_SITE_OTHER): Payer: Self-pay

## 2020-09-24 ENCOUNTER — Other Ambulatory Visit: Payer: Self-pay

## 2020-09-24 DIAGNOSIS — Z6830 Body mass index (BMI) 30.0-30.9, adult: Secondary | ICD-10-CM

## 2020-09-24 DIAGNOSIS — F39 Unspecified mood [affective] disorder: Secondary | ICD-10-CM | POA: Insufficient documentation

## 2020-09-24 DIAGNOSIS — R7303 Prediabetes: Secondary | ICD-10-CM | POA: Diagnosis not present

## 2020-09-24 DIAGNOSIS — Z9189 Other specified personal risk factors, not elsewhere classified: Secondary | ICD-10-CM

## 2020-09-24 DIAGNOSIS — E559 Vitamin D deficiency, unspecified: Secondary | ICD-10-CM

## 2020-09-24 DIAGNOSIS — E669 Obesity, unspecified: Secondary | ICD-10-CM

## 2020-09-24 MED ORDER — METFORMIN HCL 500 MG PO TABS: 500.0000 mg | ORAL_TABLET | Freq: Two times a day (BID) | ORAL | 0 refills | Status: DC

## 2020-09-24 NOTE — Telephone Encounter (Signed)
Appointment  rescheduled for 220 today.  Pt is aware.

## 2020-09-25 ENCOUNTER — Other Ambulatory Visit (INDEPENDENT_AMBULATORY_CARE_PROVIDER_SITE_OTHER): Payer: Self-pay | Admitting: Family Medicine

## 2020-09-25 DIAGNOSIS — E559 Vitamin D deficiency, unspecified: Secondary | ICD-10-CM

## 2020-09-25 MED ORDER — VITAMIN D (ERGOCALCIFEROL) 1.25 MG (50000 UNIT) PO CAPS: 50000.0000 [IU] | ORAL_CAPSULE | ORAL | 0 refills | Status: DC

## 2020-09-25 NOTE — Telephone Encounter (Signed)
Dr.Opalski ?

## 2020-09-26 NOTE — Progress Notes (Signed)
TeleHealth Visit:  Due to the COVID-19 pandemic, this visit was completed with telemedicine (audio/video) technology to reduce patient and provider exposure as well as to preserve personal protective equipment.   Christina [name] has verbally consented to this TeleHealth visit. The patient is located at home, the provider is located at the [facility] office. The participants in this visit include the listed provider and patient. The visit was conducted today via video.  Chief Complaint: OBESITY Christina [name] is here to discuss her progress with her obesity treatment plan along with follow-up of her obesity related diagnoses. Christina [name] is on the Category 3 Plan and states she is following her eating plan approximately 90% of the time. Christina [name] states she is exercise 0 minutes 0 times per week.  Today's visit was #: 7 Starting weight: 218 lbs Starting date: 05/15/2020  Interim History: Christina [name]'s last OV was 08/15/2020. She was advised to follow up in 2-4 weeks and patient was lost to follow up as scheduled due to an insurance mix up. Patient thinks she gained 3-4 lbs since last weigh in.  Plan: Stress management techniques and emotional eating strategies discussed with patient. Start exercise some everyday for stress and obtain a counselor.  Assessment/Plan:   1. Pre-diabetes Christina [name] is prescribed Metformin. She is tolerating it well without side effects or concerns.  Christina [name] has a diagnosis of prediabetes based on her elevated HgA1c and was informed this puts her at greater risk of developing diabetes. She continues to work on diet and exercise to decrease her risk of diabetes. She denies nausea or hypoglycemia.  Lab Results  Component Value Date   HGBA1C 6.3 (H) 05/15/2020   Lab Results  Component Value Date   INSULIN 34.7 (H) 05/15/2020   Plan: Refill Metformin for 1 month, as per below. Christina [name] will continue to work on weight loss, exercise, and decreasing simple carbohydrates to help  decrease the risk of diabetes.   Refill- metFORMIN (GLUCOPHAGE) 500 MG tablet; Take 1 tablet (500 mg total) by mouth 2 (two) times daily with a meal.  Dispense: 60 tablet; Refill: 0  2. Vitamin D deficiency Christina [name]'s Vitamin D level was 21.2 on 05/15/2020. She is currently taking prescription vitamin D 50,000 IU each week. She denies nausea, vomiting or muscle weakness.  Ref. Range 05/15/2020 09:36  Vitamin D, 25-Hydroxy Latest Ref Range: 30.0 - 100.0 ng/mL 21.2 (L)   Plan: Refill Vit D for 1 month, as per below. Continue current treatment plan. Recheck levels in the near future. Patient is going to [facility] PCP for CPE and labs in the near future. She will bring [name] those results and we will obtain additional labs prn. Low Vitamin D level contributes to fatigue and are associated with obesity, breast, and colon cancer. She agrees to continue to take prescription Vitamin D @50 ,000 IU every week and will follow-up for routine testing of Vitamin D, at least 2-3 times per year to avoid over-replacement.  Refill- Vitamin D, Ergocalciferol, (DRISDOL) 1.25 MG (50000 UNIT) CAPS capsule; Take 1 capsule (50,000 Units total) by mouth every 7 (seven) days.  Dispense: 4 capsule; Refill: 0  3. Mood disorder (Christina [name]), with emotional eating Christina [name] saw her PCP about 1-2 weeks ago and told Dr. Justin Mend that she was down and "blah". PCP changed Prozac to 40 mg daily from 20 mg. She is feeling better now.   Plan: I recommend CBT. PCP gave Christina [name] a list of providers to contact for an appointment. Continue current treatment plan, per PCP.  Discussed with patient emotional eating strategies and management of stress.  4. At risk for diabetes mellitus - Christina [name] was given extensive diabetes prevention education and counseling today of more than 9 minutes.  - Counseled patient on pathophysiology of disease and discussed various treatment options which always includes dietary and lifestyle modification as first line.   - Importance of  healthy diet with very limited amounts of simple carbohydrates discussed with patient in addition to regular aerobic exercise to an eventual goal of 1min 5d/week or more.  - Handouts provided at patient's desire and or told to go online at the American Diabetes Association website for further information  5. Class 1 obesity with serious comorbidity and body mass index (BMI) of 30.0 to 30.9 in adult, unspecified obesity type Christina [name] is currently in the action stage of change. As such, her goal is to continue with weight loss efforts. She has agreed to the Category 3 Plan.   Exercise goals: All adults should avoid inactivity. Some physical activity is better than none, and adults who participate in any amount of physical activity gain some health benefits.  Behavioral modification strategies: keeping healthy foods in the home, ways to avoid boredom eating, emotional eating strategies and planning for success.  Christina [name] has agreed to follow-up with our clinic in 2 weeks on 10/08/2020 at 1600. She was informed of the importance of frequent follow-up visits to maximize her success with intensive lifestyle modifications for her multiple health conditions.  Objective:   VITALS: Per patient if applicable, see vitals. GENERAL: Alert and in no acute distress. CARDIOPULMONARY: No increased WOB. Speaking in clear sentences.  PSYCH: Pleasant and cooperative. Speech normal rate and rhythm. Affect is appropriate. Insight and judgement are appropriate. Attention is focused, linear, and appropriate.  NEURO: Oriented as arrived to appointment on time with no prompting.   Lab Results  Component Value Date   CREATININE 0.71 05/15/2020   BUN 10 05/15/2020   NA 138 05/15/2020   K 4.7 05/15/2020   CL 105 05/15/2020   CO2 23 05/15/2020   Lab Results  Component Value Date   ALT 34 (H) 05/15/2020   AST 23 05/15/2020   ALKPHOS 52 05/15/2020   BILITOT 0.3 05/15/2020   Lab Results  Component Value Date    HGBA1C 6.3 (H) 05/15/2020   Lab Results  Component Value Date   INSULIN 34.7 (H) 05/15/2020   Lab Results  Component Value Date   TSH 1.730 05/15/2020   Lab Results  Component Value Date   CHOL 125 05/15/2020   HDL 36 (L) 05/15/2020   LDLCALC 71 05/15/2020   TRIG 95 05/15/2020   Lab Results  Component Value Date   WBC 6.8 05/15/2020   HGB 13.8 05/15/2020   HCT 42.0 05/15/2020   MCV 87 05/15/2020   PLT 308 05/15/2020   No results found for: IRON, TIBC, FERRITIN  Attestation Statements:   Reviewed by clinician on day of visit: allergies, medications, problem list, medical history, surgical history, family history, social history, and previous encounter notes.  Coral Ceo, am acting as Location manager for [employer], DO.  I have reviewed the above documentation for accuracy and completeness, and I agree with the above. Marjory Sneddon, D.O.  The [place] was signed into law in 2016 which includes the topic of electronic health records.  This provides immediate access to information in MyChart.  This includes consultation notes, operative notes, office notes, lab results and pathology reports.  If you have any questions about what you read please let us know at your next visit so we can discuss your concerns and take corrective action if need be.  We are right here with you.

## 2020-09-30 ENCOUNTER — Other Ambulatory Visit: Payer: Self-pay

## 2020-09-30 ENCOUNTER — Emergency Department (HOSPITAL_COMMUNITY)
Admission: EM | Admit: 2020-09-30 | Discharge: 2020-09-30 | Disposition: A | Discharge status: Home / Self Care | Payer: 59 | Attending: Emergency Medicine | Admitting: Emergency Medicine

## 2020-09-30 DIAGNOSIS — Z87891 Personal history of nicotine dependence: Secondary | ICD-10-CM | POA: Insufficient documentation

## 2020-09-30 DIAGNOSIS — H538 Other visual disturbances: Secondary | ICD-10-CM | POA: Insufficient documentation

## 2020-09-30 DIAGNOSIS — Z853 Personal history of malignant neoplasm of breast: Secondary | ICD-10-CM | POA: Diagnosis not present

## 2020-09-30 NOTE — ED Provider Notes (Signed)
[facility] DEPT Provider Note   CSN: [id number] Arrival date & time: 09/30/20  1641     History Chief Complaint  Patient presents with  . Blurred Vision    Christina [name] is a 54 y.o. female.  55 year old female presents with several days of floaters in her left visual field.  Denies any headache.  No visual loss.  No pain to her globe.  No drainage or redness to the eye.  States that when she listens her direction she sees almost like black spots.  No prior history of same.  Denies any use of blood thinners.        Past Medical History:  Diagnosis Date  . Anemia   . Arthritis    hands and knees  . 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 Depression   . GAD (generalized anxiety disorder)   . Gallbladder problem   . History of ovarian cyst   . IBS (irritable bowel syndrome)   . Joint pain   . PONV (postoperative nausea and vomiting)    does well with scop patch  . Pre-diabetes    followed by pcp  . Right knee meniscal tear   . Urgency of urination     Patient Active Problem List   Diagnosis Date Noted  . Mood disorder ([place]), with emotional eating 09/24/2020  . At risk for impaired metabolic function 33/54/5625  . At risk for diabetes mellitus 07/30/2020  . Vitamin D deficiency 07/02/2020  . Depression 07/02/2020  . At risk for side effect of medication 07/02/2020  . Stress due to illness of family member-  daughter with drug addiction 05/15/2020  . Depression, recurrent ([place]) 05/15/2020  . Anemia 05/15/2020  . Constipation 05/15/2020  . Prediabetes 05/15/2020  . Breast asymmetry following reconstructive surgery 04/24/2020  . Acquired absence of breast 04/24/2020  . S/P breast reconstruction, bilateral 04/24/2020  . Acute medial meniscus tear of left knee 12/20/2019  . Breast  cancer, left ([place]) 01/09/2016  . Genetic testing 12/31/2015  . 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 Family history of breast cancer in female 11/08/2015  . Family history of colon cancer 11/08/2015  . Malignant neoplasm of central portion of left breast in female, estrogen receptor positive ([place]) 10/31/2015  . Endometriosis 03/11/2011    Past Surgical History:  Procedure Laterality Date  . ABDOMINAL HYSTERECTOMY  05/1996   endometriosis  . ACHILLES TENDON REPAIR Right 2010;  revision 2011  . BLADDER SUSPENSION  2000  . BREAST BIOPSY Left 10/2015  . BREAST IMPLANT REMOVAL Bilateral 08/12/2017   Procedure: REMOVAL BILATERAL BREAST IMPLANTS;  Surgeon: Wallace Going, DO;  Location: [place];  Service: Plastics;  Laterality: Bilateral;  . BREAST RECONSTRUCTION WITH PLACEMENT OF TISSUE EXPANDER AND FLEX HD (ACELLULAR HYDRATED DERMIS) Bilateral 01/09/2016  . BREAST RECONSTRUCTION WITH PLACEMENT OF TISSUE EXPANDER AND FLEX HD (ACELLULAR HYDRATED DERMIS) Bilateral 01/09/2016   Procedure: BREAST RECONSTRUCTION WITH PLACEMENT OF TISSUE EXPANDER AND FLEX HD (ACELLULAR HYDRATED DERMIS);  Surgeon: Loel Lofty Dillingham, DO;  Location: [facility];  Service: Plastics;  Laterality: Bilateral;  . BREAST RECONSTRUCTION WITH PLACEMENT OF TISSUE EXPANDER AND FLEX HD (ACELLULAR HYDRATED DERMIS) Bilateral 05/29/2016   Procedure: PLACEMENT OF BILATERAL TISSUE EXPANDER AND FLEX HD (ACELLULAR HYDRATED DERMIS);  Surgeon: Loel Lofty Dillingham, DO;  Location: [place];  Service: Plastics;  Laterality: Bilateral;  . BREAST REDUCTION SURGERY Bilateral 11/26/2016   Procedure: BILATERAL BREAST CAPSULE CONTRACTURE RELASE;  Surgeon: Wallace Going, DO;  Location: [place];  Service: Plastics;  Laterality: Bilateral;  . [place]; 1989; 1991  . ELBOW SURGERY Right x3   last one 02-01-2019 @ [facility]  . FAT GRAFTING BILATERAL BREAST  08-09-2018  '@[facility]'   .  INCISION AND DRAINAGE OF WOUND Bilateral 02/11/2016   Procedure: IRRIGATION AND DEBRIDEMENT OF BILATERAL BREAST POCKET;  Surgeon: Wallace Going, DO;  Location: [place];  Service: Plastics;  Laterality: Bilateral;  . KNEE ARTHROSCOPY WITH MEDIAL MENISECTOMY Right 12/20/2019   Procedure: KNEE ARTHROSCOPY WITH MEDIAL MENISECTOMY;  Surgeon: Marchia Bond, MD;  Location: [place];  Service: Orthopedics;  Laterality: Right;  . LAPAROSCOPIC APPENDECTOMY  04-07-2011   '@[facility]'    w/ Excision peritoneal lipoma and lysis adhesions  . LAPAROSCOPIC CHOLECYSTECTOMY  ~ 1999  . LIPOSUCTION WITH LIPOFILLING Bilateral 11/26/2016   Procedure: LIPOFILLING FOR SYMMETRY;  Surgeon: Wallace Going, DO;  Location: [place];  Service: Plastics;  Laterality: Bilateral;  . LIPOSUCTION WITH LIPOFILLING Bilateral 01/21/2017   Procedure: BILATERAL BREAST  LIPOFILLING FOR ASYMMETRY;  Surgeon: Wallace Going, DO;  Location: [place];  Service: Plastics;  Laterality: Bilateral;  . LIPOSUCTION WITH LIPOFILLING Bilateral 06/28/2020   Procedure: Lipofilling bilateral breasts for asymmetry;  Surgeon: Wallace Going, DO;  Location: [place];  Service: Plastics;  Laterality: Bilateral;  90 min  . MASTECTOMY Bilateral 01/09/2016  . NIPPLE SPARING MASTECTOMY/SENTINAL LYMPH NODE BIOPSY/RECONSTRUCTION/PLACEMENT OF TISSUE EXPANDER Bilateral 01/09/2016   Procedure: BILATERAL NIPPLE SPARING MASTECTOMY WITH LEFT SENTINAL LYMPH NODE BIOPSY ;  Surgeon: Stark Klein, MD;  Location: [place];  Service: General;  Laterality: Bilateral;  . REMOVAL OF BILATERAL TISSUE EXPANDERS WITH PLACEMENT OF BILATERAL BREAST IMPLANTS Bilateral 08/20/2016   Procedure: REMOVAL OF BILATERAL TISSUE EXPANDERS WITH PLACEMENT OF BILATERAL SILICONE IMPLANTS;  Surgeon: Wallace Going, DO;  Location: [place];  Service: Plastics;  Laterality: Bilateral;  .  REMOVAL OF BILATERAL TISSUE EXPANDERS WITH PLACEMENT OF BILATERAL BREAST IMPLANTS Bilateral 11/05/2017   Procedure: REMOVAL OF BILATERAL TISSUE EXPANDERS WITH PLACEMENT OF BILATERAL BREAST SILICONE IMPLANTS;  Surgeon: Wallace Going, DO;  Location: [place];  Service: Plastics;  Laterality: Bilateral;  . REMOVAL OF TISSUE EXPANDER Bilateral 02/11/2016   Procedure: REMOVAL OF BILATERAL TISSUE EXPANDERS AND FLEX HD REMOVAL;  Surgeon: Wallace Going, DO;  Location: [place];  Service: Plastics;  Laterality: Bilateral;  . TISSUE EXPANDER PLACEMENT Bilateral 08/12/2017   Procedure: PLACEMENT OF BILATERAL TISSUE EXPANDER;  Surgeon: Wallace Going, DO;  Location: [place];  Service: Plastics;  Laterality: Bilateral;  . TUBAL LIGATION Bilateral 1991     OB History    Gravida  4   Para  3   Term      Preterm      AB      Living  3     SAB      IAB      Ectopic      Multiple      Live Births              Family History  Problem Relation Age of Onset  . Heart failure Father   . Prostate cancer Father 34  . Colon polyps  Mother        approx 2  . Other Mother        hx HPV and hysterectomy due to precancerous cells  . Depression Mother   . Anxiety disorder Mother   . Other Sister 32       hx of hysterectomy for unspecified reason; still has ovaries  . Other Sister 75       paternal half-sister hx of hysterectomy for unspecified reason; still has ovaries  . Bladder Cancer Maternal Uncle 79       not a smoker  . Kidney failure Maternal Grandmother   . Congestive Heart Failure Maternal Grandmother   . Colon cancer Maternal Grandmother 72  . Lung cancer Maternal Grandfather 78       smoker  . Breast cancer Paternal Grandmother        dx. early 56s; w/ hx of trauma to breast  . Crohn's disease Daughter   . Lung cancer Maternal Uncle 39       smoker    Social History   Tobacco Use  . Smoking status:  Former Smoker    Packs/day: 1.00    Years: 10.00    Pack years: 10.00    Types: Cigarettes    Quit date: 06/08/2008    Years since quitting: 12.3  . Smokeless tobacco: Never Used  Vaping Use  . Vaping Use: Never used  Substance Use Topics  . Alcohol use: No  . Drug use: Never    Home Medications Prior to Admission medications   Medication Sig Start Date End Date Taking? Authorizing Provider  ALPRAZolam (XANAX) 0.25 MG tablet Take 0.25 mg by mouth daily as needed. 07/20/20   [provider]  amitriptyline (ELAVIL) 100 MG tablet Take 0.5 tablets (50 mg total) by mouth at bedtime. 07/02/20   Mellody Dance, DO  FLUoxetine (PROZAC) 20 MG tablet 1 tablet daily 08/15/20   Opalski, Neoma Laming, DO  metFORMIN (GLUCOPHAGE) 500 MG tablet Take 1 tablet (500 mg total) by mouth 2 (two) times daily with a meal. 09/24/20   Opalski, Neoma Laming, DO  vitamin B-12 (CYANOCOBALAMIN) 500 MCG tablet Take 1 tablet (500 mcg total) by mouth daily. 06/13/20   Opalski, Neoma Laming, DO  Vitamin D, Ergocalciferol, (DRISDOL) 1.25 MG (50000 UNIT) CAPS capsule Take 1 capsule (50,000 Units total) by mouth every 7 (seven) days. 09/25/20   Mellody Dance, DO    Allergies    Patient has no known allergies.  Review of Systems   Review of Systems  All other systems reviewed and are negative.   Physical Exam Updated Vital Signs BP (!) 157/88 (BP Location: Left Arm)   Pulse 73   Temp 98.1 F (36.7 C) (Oral)   Resp 18   Ht 1.702 m ('5\' 7"' )   Wt 94.8 kg   SpO2 98%   BMI 32.73 kg/m   Physical Exam Vitals and nursing note reviewed.  Constitutional:      General: She is not in acute distress.    Appearance: Normal appearance. She is well-developed and well-nourished. She is not toxic-appearing.  HENT:     Head: Normocephalic and atraumatic.  Eyes:     General: Lids are normal.     Extraocular Movements: EOM normal.     Conjunctiva/sclera: Conjunctivae normal.     Pupils: Pupils are equal, round, and reactive  to light.     Comments: Visual acuity is 20/40 corrected in both eyes.  Visual fields are normal  Neck:  Thyroid: No thyroid mass.     Trachea: No tracheal deviation.  Cardiovascular:     Rate and Rhythm: Normal rate and regular rhythm.     Heart sounds: Normal heart sounds. No murmur heard. No gallop.   Pulmonary:     Effort: Pulmonary effort is normal. No respiratory distress.     Breath sounds: Normal breath sounds. No stridor. No decreased breath sounds, wheezing, rhonchi or rales.  Abdominal:     General: Bowel sounds are normal. There is no distension.     Palpations: Abdomen is soft.     Tenderness: There is no abdominal tenderness. There is no CVA tenderness or rebound.  Musculoskeletal:        General: No tenderness or edema. Normal range of motion.     Cervical back: Normal range of motion and neck supple.  Skin:    General: Skin is warm and dry.     Findings: No abrasion or rash.  Neurological:     Mental Status: She is alert and oriented to person, place, and time.     GCS: GCS eye subscore is 4. GCS verbal subscore is 5. GCS motor subscore is 6.     Cranial Nerves: No cranial nerve deficit.     Sensory: No sensory deficit.     Deep Tendon Reflexes: Strength normal.  Psychiatric:        Mood and Affect: Mood and affect normal.        Speech: Speech normal.        Behavior: Behavior normal.     ED Results / Procedures / Treatments   Labs (all labs ordered are listed, but only abnormal results are displayed) Labs Reviewed - No data to display  EKG None  Radiology No results found.  Procedures Procedures (including critical care time)  Medications Ordered in ED Medications - No data to display  ED Course  I have reviewed the triage vital signs and the nursing notes.  Pertinent labs & imaging results that were available during my care of the patient were reviewed by me and considered in my medical decision making (see chart for details).    MDM  Rules/Calculators/A&P                          Discussed with Dr. Kathlen Mody from ophthalmology and he will see her tomorrow and follow-up Final Clinical Impression(s) / ED Diagnoses Final diagnoses:  None    Rx / DC Orders ED Discharge Orders    None       Lacretia Leigh, MD 09/30/20 2036

## 2020-09-30 NOTE — Discharge Instructions (Addendum)
Call tomorrow at 7:30 in the morning so that you can be seen by the ophthalmologist in the afternoon

## 2020-09-30 NOTE — ED Triage Notes (Signed)
Patient reports to the ER for blurred vision. Patient reports it is dark from the middle of left eye down. Patient denies pain. Patient reports blurred vision down

## 2020-10-01 ENCOUNTER — Encounter (INDEPENDENT_AMBULATORY_CARE_PROVIDER_SITE_OTHER): Payer: Self-pay | Admitting: Ophthalmology

## 2020-10-01 ENCOUNTER — Ambulatory Visit (INDEPENDENT_AMBULATORY_CARE_PROVIDER_SITE_OTHER): Payer: 59 | Admitting: Ophthalmology

## 2020-10-01 DIAGNOSIS — E119 Type 2 diabetes mellitus without complications: Secondary | ICD-10-CM

## 2020-10-01 DIAGNOSIS — H25813 Combined forms of age-related cataract, bilateral: Secondary | ICD-10-CM

## 2020-10-01 DIAGNOSIS — H33321 Round hole, right eye: Secondary | ICD-10-CM | POA: Diagnosis not present

## 2020-10-01 DIAGNOSIS — H3322 Serous retinal detachment, left eye: Secondary | ICD-10-CM

## 2020-10-01 DIAGNOSIS — H3581 Retinal edema: Secondary | ICD-10-CM

## 2020-10-01 HISTORY — PX: RETINAL DETACHMENT SURGERY: SHX105

## 2020-10-01 HISTORY — PX: EYE SURGERY: SHX253

## 2020-10-01 MED ORDER — NEOMYCIN-POLYMYXIN-DEXAMETH 3.5-10000-0.1 OP OINT: 1.0000 "application " | TOPICAL_OINTMENT | Freq: Four times a day (QID) | OPHTHALMIC | 3 refills | Status: DC

## 2020-10-01 NOTE — Progress Notes (Signed)
[facility] Note  10/01/2020     CHIEF COMPLAINT Patient presents for Retina Evaluation   HISTORY OF PRESENT ILLNESS: Christina [name] is a 55 y.o. female who presents to the clinic today for:   HPI    Retina Evaluation    In left eye.  This started 4 days ago.  Duration of 4 days.  Associated Symptoms Floaters and Blind Spot.  Negative for Flashes, Distortion, Pain, Redness, Photophobia, Glare, Trauma, Scalp Tenderness, Jaw Claudication, Shoulder/Hip pain, Fever, Weight Loss and Fatigue.  Context:  distance vision, mid-range vision and near vision.  Treatments tried include no treatments.  I, the attending physician,  performed the HPI with the patient and updated documentation appropriately.          Comments    Patient c/o sudden onset of floaters OS, starting 4 days ago. Patient has inferior nasal loss of vision OS for the last 2 days. Patient denies flashes. Patient denies symptoms OD.        Last edited by Bernarda Caffey, MD on 10/01/2020  5:10 PM. (History)    pt states about 4-5 days ago she started to see "black peppery things" in her left eye vision, on Saturday she started to see a shadow nasally, she states she called her PCP yesterday and she told her to go to the ED, pt states she saw Dr. Kathlen Mody this morning who told her she had a detached retina, pt states symptoms have gotten worse since they started, pt takes metformin, but is not sure if she is pre-diabetic or diabetic, she does not take medication for BP  Referring physician: Hortencia Pilar, MD [place],  [place] [id number]  HISTORICAL INFORMATION:   Selected notes from the [place] Referred by Dr. Quentin Ore for RD OS LEE:  Ocular Hx- PMH-    CURRENT MEDICATIONS: Current Outpatient Medications (Ophthalmic Drugs)  Medication Sig  . neomycin-polymyxin b-dexamethasone (MAXITROL) 3.5-10000-0.1 OINT Place 1 application into the left eye 4 (four)  times daily.   No current facility-administered medications for this visit. (Ophthalmic Drugs)   Current Outpatient Medications (Other)  Medication Sig  . ALPRAZolam (XANAX) 0.25 MG tablet Take 0.25 mg by mouth daily as needed.  Marland Kitchen amitriptyline (ELAVIL) 100 MG tablet Take 0.5 tablets (50 mg total) by mouth at bedtime.  Marland Kitchen FLUoxetine (PROZAC) 20 MG tablet 1 tablet daily  . metFORMIN (GLUCOPHAGE) 500 MG tablet Take 1 tablet (500 mg total) by mouth 2 (two) times daily with a meal.  . vitamin B-12 (CYANOCOBALAMIN) 500 MCG tablet Take 1 tablet (500 mcg total) by mouth daily.  . Vitamin D, Ergocalciferol, (DRISDOL) 1.25 MG (50000 UNIT) CAPS capsule Take 1 capsule (50,000 Units total) by mouth every 7 (seven) days.   No current facility-administered medications for this visit. (Other)      REVIEW OF SYSTEMS: ROS    Positive for: Endocrine, Eyes   Negative for: Constitutional, Gastrointestinal, Neurological, Skin, Genitourinary, Musculoskeletal, HENT, Cardiovascular, Respiratory, Psychiatric, Allergic/Imm, Heme/Lymph   Last edited by Roselee Nova D, COT on 10/01/2020  1:45 PM. (History)       ALLERGIES No Known Allergies  PAST MEDICAL HISTORY Past Medical History:  Diagnosis Date  . Anemia   . Arthritis    hands and knees  . 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 Depression   . GAD (generalized anxiety disorder)   . Gallbladder problem   . History of ovarian cyst   . IBS (irritable bowel syndrome)   . Joint pain   . PONV (postoperative nausea and vomiting)    does well with scop patch  . Pre-diabetes    followed by pcp  . Right knee meniscal tear   . Urgency of urination    Past Surgical History:  Procedure Laterality Date  . ABDOMINAL HYSTERECTOMY  05/1996   endometriosis  . ACHILLES TENDON REPAIR Right 2010;   revision 2011  . BLADDER SUSPENSION  2000  . BREAST BIOPSY Left 10/2015  . BREAST IMPLANT REMOVAL Bilateral 08/12/2017   Procedure: REMOVAL BILATERAL BREAST IMPLANTS;  Surgeon: Wallace Going, DO;  Location: [place];  Service: Plastics;  Laterality: Bilateral;  . BREAST RECONSTRUCTION WITH PLACEMENT OF TISSUE EXPANDER AND FLEX HD (ACELLULAR HYDRATED DERMIS) Bilateral 01/09/2016  . BREAST RECONSTRUCTION WITH PLACEMENT OF TISSUE EXPANDER AND FLEX HD (ACELLULAR HYDRATED DERMIS) Bilateral 01/09/2016   Procedure: BREAST RECONSTRUCTION WITH PLACEMENT OF TISSUE EXPANDER AND FLEX HD (ACELLULAR HYDRATED DERMIS);  Surgeon: Loel Lofty Dillingham, DO;  Location: [place];  Service: Plastics;  Laterality: Bilateral;  . BREAST RECONSTRUCTION WITH PLACEMENT OF TISSUE EXPANDER AND FLEX HD (ACELLULAR HYDRATED DERMIS) Bilateral 05/29/2016   Procedure: PLACEMENT OF BILATERAL TISSUE EXPANDER AND FLEX HD (ACELLULAR HYDRATED DERMIS);  Surgeon: Wallace Going, DO;  Location: [place];  Service: Plastics;  Laterality: Bilateral;  . BREAST REDUCTION SURGERY Bilateral 11/26/2016   Procedure: BILATERAL BREAST CAPSULE CONTRACTURE RELASE;  Surgeon: Wallace Going, DO;  Location: [place];  Service: Plastics;  Laterality: Bilateral;  . [place]; 1989; 1991  . ELBOW SURGERY Right x3   last one 02-01-2019 @ [facility]  . FAT GRAFTING BILATERAL BREAST  08-09-2018  _0   . INCISION AND DRAINAGE OF WOUND Bilateral 02/11/2016   Procedure: IRRIGATION AND DEBRIDEMENT OF BILATERAL BREAST POCKET;  Surgeon: Wallace Going, DO;  Location: [place];  Service: Plastics;  Laterality: Bilateral;  . KNEE ARTHROSCOPY WITH MEDIAL MENISECTOMY Right 12/20/2019   Procedure: KNEE ARTHROSCOPY WITH MEDIAL MENISECTOMY;  Surgeon: Marchia Bond, MD;  Location: [place];  Service: Orthopedics;  Laterality: Right;  . LAPAROSCOPIC APPENDECTOMY  04-07-2011    _1    w/ Excision peritoneal lipoma and lysis adhesions  . LAPAROSCOPIC CHOLECYSTECTOMY  ~ 1999  . LIPOSUCTION WITH LIPOFILLING Bilateral 11/26/2016   Procedure: LIPOFILLING FOR SYMMETRY;  Surgeon: Wallace Going, DO;  Location: [place];  Service: Plastics;  Laterality: Bilateral;  . LIPOSUCTION WITH LIPOFILLING Bilateral 01/21/2017   Procedure: BILATERAL BREAST  LIPOFILLING FOR ASYMMETRY;  Surgeon: Wallace Going, DO;  Location: [place];  Service: Plastics;  Laterality: Bilateral;  . LIPOSUCTION WITH LIPOFILLING Bilateral 06/28/2020   Procedure: Lipofilling bilateral breasts for asymmetry;  Surgeon: Wallace Going, DO;  Location: [place];  Service: Plastics;  Laterality: Bilateral;  90 min  . MASTECTOMY Bilateral 01/09/2016  . NIPPLE SPARING MASTECTOMY/SENTINAL LYMPH NODE BIOPSY/RECONSTRUCTION/PLACEMENT OF TISSUE EXPANDER Bilateral 01/09/2016   Procedure: BILATERAL NIPPLE SPARING MASTECTOMY WITH LEFT SENTINAL LYMPH NODE BIOPSY ;  Surgeon: Stark Klein, MD;  Location: [place];  Service: General;  Laterality: Bilateral;  . REMOVAL OF BILATERAL TISSUE EXPANDERS WITH PLACEMENT OF BILATERAL BREAST IMPLANTS Bilateral 08/20/2016   Procedure: REMOVAL OF BILATERAL TISSUE EXPANDERS WITH PLACEMENT OF  BILATERAL SILICONE IMPLANTS;  Surgeon: Wallace Going, DO;  Location: [place];  Service: Plastics;  Laterality: Bilateral;  . REMOVAL OF BILATERAL TISSUE EXPANDERS WITH PLACEMENT OF BILATERAL BREAST IMPLANTS Bilateral 11/05/2017   Procedure: REMOVAL OF BILATERAL TISSUE EXPANDERS WITH PLACEMENT OF BILATERAL BREAST SILICONE IMPLANTS;  Surgeon: Wallace Going, DO;  Location: [place];  Service: Plastics;  Laterality: Bilateral;  . REMOVAL OF TISSUE EXPANDER Bilateral 02/11/2016   Procedure: REMOVAL OF BILATERAL TISSUE EXPANDERS AND FLEX HD REMOVAL;  Surgeon: Wallace Going, DO;  Location: [place];  Service: Plastics;  Laterality: Bilateral;  . TISSUE EXPANDER PLACEMENT Bilateral 08/12/2017   Procedure: PLACEMENT OF BILATERAL TISSUE EXPANDER;  Surgeon: Wallace Going, DO;  Location: [place];  Service: Plastics;  Laterality: Bilateral;  . TUBAL LIGATION Bilateral 1991    FAMILY HISTORY Family History  Problem Relation Age of Onset  . Heart failure Father   . Prostate cancer Father 48  . Retinal detachment Father   . Colon polyps Mother        approx 2  . Other Mother        hx HPV and hysterectomy due to precancerous cells  . Depression Mother   . Anxiety disorder Mother   . Other Sister 60       hx of hysterectomy for unspecified reason; still has ovaries  . Other Sister 49       paternal half-sister hx of hysterectomy for unspecified reason; still has ovaries  . Bladder Cancer Maternal Uncle 79       not a smoker  . Kidney failure Maternal Grandmother   . Congestive Heart Failure Maternal Grandmother   . Colon cancer Maternal Grandmother 12  . Diabetes Maternal Grandmother   . Lung cancer Maternal Grandfather 61       smoker  . Breast cancer Paternal Grandmother        dx. early 58s; w/ hx of trauma to breast  . Crohn's disease Daughter   . Lung cancer Maternal Uncle 59       smoker    SOCIAL HISTORY Social History   Tobacco Use  . Smoking status: Former Smoker    Packs/day: 1.00    Years: 10.00    Pack years: 10.00    Types: Cigarettes    Quit date: 06/08/2008    Years since quitting: 12.3  . Smokeless tobacco: Never Used  Vaping Use  . Vaping Use: Never used  Substance Use Topics  . Alcohol use: No  . Drug use: Never         OPHTHALMIC EXAM:  Base Eye Exam    Visual Acuity (Snellen - Linear)      Right Left   Dist cc 20/30 +1 20/40 -1   Dist ph cc 20/25 +1 20/30 +1       Tonometry (Tonopen, 1:55 PM)      Right Left   Pressure 16 12       Pupils      Dark Light Shape React APD   Right 8 8 Round  None None   Left 8 8 Round None None       Visual Fields (Counting fingers)      Left Right     Full   Restrictions Partial outer inferior nasal deficiency        Extraocular Movement      Right Left    Full, Ortho Full, Ortho  Neuro/Psych    Oriented x3: Yes   Mood/Affect: Normal       Dilation    Both eyes: 1.0% Mydriacyl, 2.5% Phenylephrine @ 1:55 PM        Slit Lamp and Fundus Exam    Slit Lamp Exam      Right Left   Lids/Lashes Dermatochalasis - upper lid Dermatochalasis - upper lid   Conjunctiva/Sclera White and quiet White and quiet   Cornea 2+ Punctate epithelial erosions, mild tear film debris 1-2+ Punctate epithelial erosions, mild tear film debris   Anterior Chamber Deep and quiet Deep and quiet   Iris Round and dilated Round and dilated   Lens 1-2+ Nuclear sclerosis, 1-2+ Cortical cataract 1-2+ Nuclear sclerosis, 1-2+ Cortical cataract   Vitreous Vitreous syneresis, Posterior vitreous detachment, Weiss ring Vitreous syneresis, no pigment, Posterior vitreous detachment, vitreous condensations, +debris       Fundus Exam      Right Left   Disc Pink and Sharp, temporal PPA Pink and Sharp, mild tilt, temporal PPA   C/D Ratio 0.3 0.4   Macula Flat, Blunted foveal reflex, RPE mottling, No heme or edema Flat, Blunted foveal reflex, RPE mottling, shallow SRF almost to ST arcades   Vessels mild attenuation mild attenuation   Periphery Attached, small operculated hole at 0430, paving stone degeneration Bull[address] RD from 0100-230, large HST at 0130; mild pavingstone degen inferiorly        Refraction    Wearing Rx      Sphere Cylinder Axis   Right -4.00 +0.75 100   Left -4.50 +0.75 100       Manifest Refraction      Sphere Cylinder Axis Dist VA   Right -3.50 +0.75 075 20/30   Left -3.50 +1.00 110 20/40+2          IMAGING AND PROCEDURES  Imaging and Procedures for 10/01/2020  OCT, Retina - OU - Both Eyes       Right Eye Quality was good. Central  Foveal Thickness: 254. Progression has no prior data. Findings include abnormal foveal contour, no IRF, no SRF (irregular foveal contour ).   Left Eye Quality was good. Central Foveal Thickness: 261. Progression has no prior data. Findings include normal foveal contour, no IRF, subretinal fluid (+SRF ST periphery, just outside ST arcades caught on widefield; +vitreous opacities).   Notes *Images captured and stored on drive  Diagnosis / Impression:  OD: abnormal foveal countour no IRF/SRF OS:+SRF ST periphery, just outside ST arcades caught on widefield; +vitreous opacities   Clinical management:  See below  Abbreviations: NFP - Normal foveal profile. CME - cystoid macular edema. PED - pigment epithelial detachment. IRF - intraretinal fluid. SRF - subretinal fluid. EZ - ellipsoid zone. ERM - epiretinal membrane. ORA - outer retinal atrophy. ORT - outer retinal tubulation. SRHM - subretinal hyper-reflective material. IRHM - intraretinal hyper-reflective material        Pneumatic Retinopexy - OS - Left Eye       PROCEDURE NOTE  Diagnosis:  Retinal detachment with retinal tear, LEFT EYE  Procedure:  Pneumatic cryopexy with C3F8 gas injection, LEFT EYE  CPT:  78242  Surgeon: Bernarda Caffey, M.D.,Ph.D.  Anesthesia:  Subconjunctival Lidocaine   The patient was brought to the procedure room. Informed consent was obtained for cryopexy of tear and intravitreal gas injection. The diagnosis was reviewed with the patient and all questions were answered. The risks of the procedure including potential systemic risks like stroke and heart attack and  other thrombotic events as well as the local risks like infection, endophthalmitis, retinal detachment were discussed. The patient consented to the procedure.   The LEFT eye was marked and a time out was performed identifying the correct eye. Subsequently, the patient was placed in the supine position. Topical anesthetic drops were given and a lid  speculum was placed to expose the eye. Subsequently, 2% Lidocaine was injected subconjunctivally in the quadrants of the tears giving adequate anesthesia. Using indirect ophthalmoscopy the cryo probe was positioned beneath the tear at 0130 and choroidal and retinal whitening was achieved 360 degrees around the tear margins. The superotemporal quadrant was then cleaned with Betadine swabs and allowed to dry. At this time, 4 mm posterior to the limbus utilizing a 30-gauge needle, 0.45 cc of pure, 100% C3F8 gas was injected into the vitreous cavity under direct visualization. The needle was then withdrawn from the eye and the retina and gas bubble were visualized. An AC tap was performed to normalize the pressure and the central retinal artery was noted to be patent. Betadine was then reapplied to the injection sites and then rinsed with sterile BSS. A drop of polymixin ophthalmic soln and maxitrol opthalmic ung were applied to the eye, then the eye was double patched with eye pads. An arrow was drawn on the dressing to assist with post-operative positioning. There were no complications. Discharge and post-operative instructions were reviewed.                 ASSESSMENT/PLAN:    ICD-10-CM   1. Left retinal detachment  H33.22 Pneumatic Retinopexy - OS - Left Eye  2. Retinal edema  H35.81 OCT, Retina - OU - Both Eyes  3. Retinal hole of right eye  H33.321   4. Diabetes mellitus type 2 without retinopathy ([place])  E11.9   5. Combined forms of age-related cataract of both eyes  H25.813     1,2. 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 - detached from 1 to 230 oclock w/ SRF still outside of ST arcades, large horseshoe tear at 130 - The incidence, risk factors, and natural history of retinal detachment was discussed with patient.   - Potential treatment options including delimiting laser, pneumatic retinopexy, scleral buckle, and vitrectomy, cryotherapy and  laser, and the use of air, gas, and oil discussed with patient. - The risks of blindness, loss of vision, infection, hemorrhage, cataract progression or lens displacement were discussed with patient. - recommend pneumatic retinopexy OS today, 01.24.22 - pt wishes to proceed with procedure - RBA of procedure discussed, questions answered - informed consent obtained and signed - see procedure note above - start Maxitrol Ung QID OS - f/u tomorrow for POV1  3. Retinal hole, OD - small operculated hole at 0430 - The incidence, risk factors, and natural history of retinal tear was discussed with patient.   - Potential treatment options including laser retinopexy and cryotherapy discussed with patient. - recommend laser retinopexy OD once OS more stable - monitor  4. Diabetes mellitus, type 2 without retinopathy - The incidence, risk factors for progression, natural history and treatment options for diabetic retinopathy  were discussed with patient.   - The need for close monitoring of blood glucose, blood pressure, and serum lipids, avoiding cigarette or any type of tobacco, and the need for long term follow up was also discussed with patient. - monitor  5. Mixed Cataract OU - The symptoms of cataract, surgical options, and treatments and risks were  discussed with patient. - discussed diagnosis and progression - not yet visually significant - monitor for now   Ophthalmic Meds Ordered this visit:  Meds ordered this encounter  Medications  . neomycin-polymyxin b-dexamethasone (MAXITROL) 3.5-10000-0.1 OINT    Sig: Place 1 application into the left eye 4 (four) times daily.    Dispense:  3.5 g    Refill:  3      Return in about 1 day (around 10/02/2020) for f/u RD OS, DFE, OCT.  There are no Patient Instructions on file for this visit.   Explained the diagnoses, plan, and follow up with the patient and they expressed understanding.  Patient expressed understanding of the importance  of proper follow up care.   This document serves as a record of services personally performed by Gardiner Sleeper, MD, PhD. It was created on their behalf by San Jetty. Owens Shark, OA an ophthalmic technician. The creation of this record is the provider's dictation and/or activities during the visit.    Electronically signed by: San Jetty. [place], [place] 01.24.2022 5:20 PM  Gardiner Sleeper, M.D., Ph.D. Diseases & Surgery of the Retina and Vitreous [facility]  I have reviewed the above documentation for accuracy and completeness, and I agree with the above. Gardiner Sleeper, M.D., Ph.D. 10/01/20 5:20 PM   Abbreviations: M myopia (nearsighted); A astigmatism; H hyperopia (farsighted); P presbyopia; Mrx spectacle prescription;  CTL contact lenses; OD right eye; OS left eye; OU both eyes  XT exotropia; ET esotropia; PEK punctate epithelial keratitis; PEE punctate epithelial erosions; DES dry eye syndrome; MGD meibomian gland dysfunction; ATs artificial tears; PFAT's preservative free artificial tears; [place] nuclear sclerotic cataract; PSC posterior subcapsular cataract; ERM epi-retinal membrane; PVD posterior vitreous detachment; RD retinal detachment; DM diabetes mellitus; DR diabetic retinopathy; NPDR non-proliferative diabetic retinopathy; PDR proliferative diabetic retinopathy; CSME clinically significant macular edema; DME diabetic macular edema; dbh dot blot hemorrhages; CWS cotton wool spot; POAG primary open angle glaucoma; C/D cup-to-disc ratio; HVF humphrey visual field; GVF goldmann visual field; OCT optical coherence tomography; IOP intraocular pressure; BRVO Branch retinal vein occlusion; CRVO central retinal vein occlusion; CRAO central retinal artery occlusion; BRAO branch retinal artery occlusion; RT retinal tear; SB scleral buckle; PPV pars plana vitrectomy; VH Vitreous hemorrhage; PRP panretinal laser photocoagulation; IVK intravitreal kenalog; VMT vitreomacular traction; MH  Macular hole;  NVD neovascularization of the disc; NVE neovascularization elsewhere; AREDS age related eye disease study; ARMD age related macular degeneration; POAG primary open angle glaucoma; EBMD epithelial/anterior basement membrane dystrophy; ACIOL anterior chamber intraocular lens; IOL intraocular lens; PCIOL posterior chamber intraocular lens; Phaco/IOL phacoemulsification with intraocular lens placement; [place] photorefractive keratectomy; LASIK laser assisted in situ keratomileusis; HTN hypertension; DM diabetes mellitus; COPD chronic obstructive pulmonary disease

## 2020-10-02 ENCOUNTER — Other Ambulatory Visit: Payer: Self-pay

## 2020-10-02 ENCOUNTER — Ambulatory Visit (INDEPENDENT_AMBULATORY_CARE_PROVIDER_SITE_OTHER): Payer: 59 | Admitting: Ophthalmology

## 2020-10-02 ENCOUNTER — Encounter (INDEPENDENT_AMBULATORY_CARE_PROVIDER_SITE_OTHER): Payer: Self-pay | Admitting: Ophthalmology

## 2020-10-02 DIAGNOSIS — H33321 Round hole, right eye: Secondary | ICD-10-CM

## 2020-10-02 DIAGNOSIS — H3322 Serous retinal detachment, left eye: Secondary | ICD-10-CM

## 2020-10-02 DIAGNOSIS — H3581 Retinal edema: Secondary | ICD-10-CM | POA: Diagnosis not present

## 2020-10-02 DIAGNOSIS — E119 Type 2 diabetes mellitus without complications: Secondary | ICD-10-CM

## 2020-10-02 DIAGNOSIS — H25813 Combined forms of age-related cataract, bilateral: Secondary | ICD-10-CM

## 2020-10-02 NOTE — Progress Notes (Signed)
[facility] Note  10/02/2020     CHIEF COMPLAINT Patient presents for Retina Follow Up   HISTORY OF PRESENT ILLNESS: Christina [name] is a 55 y.o. female who presents to the clinic today for:   HPI    Retina Follow Up    Patient presents with  Retinal Break/Detachment.  In left eye.  This started 1 day ago.          Comments    Patient here for 1 day retina follow up for RD OS. Patient states vision OS is cloudy. Can see a little bit. Had pain last night like pieces of broken off rock. Not like that now.        Last edited by Theodore Demark, COA on 10/02/2020 10:29 AM. (History)    pt states she had a hard time sleeping last night, she states last night it felt like she had a "broken rock" in her eye, but that has improved today  Referring physician: Maurice Small, MD [address],  [place] [address]  HISTORICAL INFORMATION:   Selected notes from the MEDICAL RECORD NUMBER Referred by Dr. Quentin Ore for RD OS LEE:  Ocular Hx- PMH-    CURRENT MEDICATIONS: Current Outpatient Medications (Ophthalmic Drugs)  Medication Sig  . neomycin-polymyxin b-dexamethasone (MAXITROL) 3.5-10000-0.1 OINT Place 1 application into the left eye 4 (four) times daily.   No current facility-administered medications for this visit. (Ophthalmic Drugs)   Current Outpatient Medications (Other)  Medication Sig  . ALPRAZolam (XANAX) 0.25 MG tablet Take 0.25 mg by mouth daily as needed.  Marland Kitchen amitriptyline (ELAVIL) 100 MG tablet Take 0.5 tablets (50 mg total) by mouth at bedtime.  Marland Kitchen FLUoxetine (PROZAC) 20 MG tablet 1 tablet daily  . metFORMIN (GLUCOPHAGE) 500 MG tablet Take 1 tablet (500 mg total) by mouth 2 (two) times daily with a meal.  . vitamin B-12 (CYANOCOBALAMIN) 500 MCG tablet Take 1 tablet (500 mcg total) by mouth daily.  . Vitamin D, Ergocalciferol, (DRISDOL) 1.25 MG (50000 UNIT) CAPS capsule Take 1 capsule (50,000 Units total) by  mouth every 7 (seven) days.   No current facility-administered medications for this visit. (Other)      REVIEW OF SYSTEMS: ROS    Positive for: Endocrine, Eyes   Negative for: Constitutional, Gastrointestinal, Neurological, Skin, Genitourinary, Musculoskeletal, HENT, Cardiovascular, Respiratory, Psychiatric, Allergic/Imm, Heme/Lymph   Last edited by Theodore Demark, COA on 10/02/2020 10:29 AM. (History)       ALLERGIES No Known Allergies  PAST MEDICAL HISTORY Past Medical History:  Diagnosis Date  . Anemia   . Arthritis    hands and knees  . 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 Depression   . GAD (generalized anxiety disorder)   . Gallbladder problem   . History of ovarian cyst   . IBS (irritable bowel syndrome)   . Joint pain   . PONV (postoperative nausea and vomiting)    does well with scop patch  . Pre-diabetes    followed by pcp  . Right knee meniscal tear   . Urgency of urination    Past Surgical History:  Procedure Laterality Date  . ABDOMINAL HYSTERECTOMY  05/1996   endometriosis  . ACHILLES TENDON REPAIR Right 2010;  revision 2011  . BLADDER SUSPENSION  2000  . BREAST BIOPSY Left 10/2015  . BREAST IMPLANT REMOVAL Bilateral 08/12/2017   Procedure: REMOVAL BILATERAL BREAST IMPLANTS;  Surgeon: Wallace Going, DO;  Location: [place];  Service: Plastics;  Laterality: Bilateral;  . BREAST RECONSTRUCTION WITH PLACEMENT OF TISSUE EXPANDER AND FLEX HD (ACELLULAR HYDRATED DERMIS) Bilateral 01/09/2016  . BREAST RECONSTRUCTION WITH PLACEMENT OF TISSUE EXPANDER AND FLEX HD (ACELLULAR HYDRATED DERMIS) Bilateral 01/09/2016   Procedure: BREAST RECONSTRUCTION WITH PLACEMENT OF TISSUE EXPANDER AND FLEX HD (ACELLULAR HYDRATED DERMIS);  Surgeon: Loel Lofty Dillingham, DO;  Location: [place];  Service:  Plastics;  Laterality: Bilateral;  . BREAST RECONSTRUCTION WITH PLACEMENT OF TISSUE EXPANDER AND FLEX HD (ACELLULAR HYDRATED DERMIS) Bilateral 05/29/2016   Procedure: PLACEMENT OF BILATERAL TISSUE EXPANDER AND FLEX HD (ACELLULAR HYDRATED DERMIS);  Surgeon: Wallace Going, DO;  Location: [place];  Service: Plastics;  Laterality: Bilateral;  . BREAST REDUCTION SURGERY Bilateral 11/26/2016   Procedure: BILATERAL BREAST CAPSULE CONTRACTURE RELASE;  Surgeon: Wallace Going, DO;  Location: [place];  Service: Plastics;  Laterality: Bilateral;  . [place]; 1989; 1991  . ELBOW SURGERY Right x3   last one 02-01-2019 @ [facility]  . FAT GRAFTING BILATERAL BREAST  08-09-2018  _0   . INCISION AND DRAINAGE OF WOUND Bilateral 02/11/2016   Procedure: IRRIGATION AND DEBRIDEMENT OF BILATERAL BREAST POCKET;  Surgeon: Wallace Going, DO;  Location: [place];  Service: Plastics;  Laterality: Bilateral;  . KNEE ARTHROSCOPY WITH MEDIAL MENISECTOMY Right 12/20/2019   Procedure: KNEE ARTHROSCOPY WITH MEDIAL MENISECTOMY;  Surgeon: Marchia Bond, MD;  Location: [place];  Service: Orthopedics;  Laterality: Right;  . LAPAROSCOPIC APPENDECTOMY  04-07-2011   _1    w/ Excision peritoneal lipoma and lysis adhesions  . LAPAROSCOPIC CHOLECYSTECTOMY  ~ 1999  . LIPOSUCTION WITH LIPOFILLING Bilateral 11/26/2016   Procedure: LIPOFILLING FOR SYMMETRY;  Surgeon: Wallace Going, DO;  Location: [place];  Service: Plastics;  Laterality: Bilateral;  . LIPOSUCTION WITH LIPOFILLING Bilateral 01/21/2017   Procedure: BILATERAL BREAST  LIPOFILLING FOR ASYMMETRY;  Surgeon: Wallace Going, DO;  Location: [place];  Service: Plastics;  Laterality: Bilateral;  . LIPOSUCTION WITH LIPOFILLING Bilateral 06/28/2020   Procedure: Lipofilling bilateral breasts for asymmetry;  Surgeon: Wallace Going, DO;   Location: [place];  Service: Plastics;  Laterality: Bilateral;  90 min  . MASTECTOMY Bilateral 01/09/2016  . NIPPLE SPARING MASTECTOMY/SENTINAL LYMPH NODE BIOPSY/RECONSTRUCTION/PLACEMENT OF TISSUE EXPANDER Bilateral 01/09/2016   Procedure: BILATERAL NIPPLE SPARING MASTECTOMY WITH LEFT SENTINAL LYMPH NODE BIOPSY ;  Surgeon: Stark Klein, MD;  Location: [place];  Service: General;  Laterality: Bilateral;  . REMOVAL OF BILATERAL TISSUE EXPANDERS WITH PLACEMENT OF BILATERAL BREAST IMPLANTS Bilateral 08/20/2016   Procedure: REMOVAL OF BILATERAL TISSUE EXPANDERS WITH PLACEMENT OF BILATERAL SILICONE IMPLANTS;  Surgeon: Wallace Going, DO;  Location: [place];  Service: Plastics;  Laterality: Bilateral;  . REMOVAL OF BILATERAL TISSUE EXPANDERS WITH PLACEMENT OF BILATERAL BREAST IMPLANTS Bilateral 11/05/2017   Procedure: REMOVAL OF BILATERAL TISSUE EXPANDERS WITH PLACEMENT OF BILATERAL BREAST SILICONE IMPLANTS;  Surgeon: Wallace Going, DO;  Location: [place];  Service: Plastics;  Laterality: Bilateral;  . REMOVAL OF TISSUE EXPANDER Bilateral 02/11/2016   Procedure: REMOVAL OF BILATERAL TISSUE EXPANDERS AND FLEX HD REMOVAL;  Surgeon: Wallace Going, DO;  Location: [place];  Service: Plastics;  Laterality: Bilateral;  . TISSUE  EXPANDER PLACEMENT Bilateral 08/12/2017   Procedure: PLACEMENT OF BILATERAL TISSUE EXPANDER;  Surgeon: Wallace Going, DO;  Location: [place];  Service: Plastics;  Laterality: Bilateral;  . TUBAL LIGATION Bilateral 1991    FAMILY HISTORY Family History  Problem Relation Age of Onset  . Heart failure Father   . Prostate cancer Father 19  . Retinal detachment Father   . Colon polyps Mother        approx 2  . Other Mother        hx HPV and hysterectomy due to precancerous cells  . Depression Mother   . Anxiety disorder Mother   . Other Sister 67       hx of hysterectomy for  unspecified reason; still has ovaries  . Other Sister 74       paternal half-sister hx of hysterectomy for unspecified reason; still has ovaries  . Bladder Cancer Maternal Uncle 79       not a smoker  . Kidney failure Maternal Grandmother   . Congestive Heart Failure Maternal Grandmother   . Colon cancer Maternal Grandmother 20  . Diabetes Maternal Grandmother   . Lung cancer Maternal Grandfather 82       smoker  . Breast cancer Paternal Grandmother        dx. early 42s; w/ hx of trauma to breast  . Crohn's disease Daughter   . Lung cancer Maternal Uncle 42       smoker    SOCIAL HISTORY Social History   Tobacco Use  . Smoking status: Former Smoker    Packs/day: 1.00    Years: 10.00    Pack years: 10.00    Types: Cigarettes    Quit date: 06/08/2008    Years since quitting: 12.3  . Smokeless tobacco: Never Used  Vaping Use  . Vaping Use: Never used  Substance Use Topics  . Alcohol use: No  . Drug use: Never         OPHTHALMIC EXAM:  Base Eye Exam    Visual Acuity (Snellen - Linear)      Right Left   Dist cc 20/25 -2 20/250 -1   Dist ph cc NI 20/150   Correction: Glasses       Tonometry      Right Left   Pressure  21       Visual Fields      Left Right     Full   Restrictions Partial outer inferior nasal deficiency        Extraocular Movement      Right Left    Full, Ortho Full, Ortho       Neuro/Psych    Oriented x3: Yes   Mood/Affect: Normal       Dilation    Left eye: 1.0% Mydriacyl, 2.5% Phenylephrine @ 10:27 AM        Slit Lamp and Fundus Exam    Slit Lamp Exam      Right Left   Lids/Lashes Dermatochalasis - upper lid Dermatochalasis - upper lid   Conjunctiva/Sclera White and quiet Pneumatic Chemosis temporally, Subconjunctival hemorrhage superiorly   Cornea 2+ Punctate epithelial erosions, mild tear film debris 1-2+ Punctate epithelial erosions, mild tear film debris   Anterior Chamber Deep and quiet Deep and quiet   Iris Round and  dilated Round and dilated   Lens 1-2+ Nuclear sclerosis, 1-2+ Cortical cataract 1-2+ Nuclear sclerosis, 1-2+ Cortical cataract   Vitreous Vitreous syneresis, Posterior vitreous detachment, Weiss ring  Vitreous syneresis, no pigment, Posterior vitreous detachment, vitreous condensations, +debris, 20% gas bubble with temporal satellites       Fundus Exam      Right Left   Disc  Pink and Sharp, mild tilt, temporal PPA   C/D Ratio 0.3 0.4   Macula  Flat, Blunted foveal reflex, RPE mottling, shallow SRF almost to ST arcades   Vessels  mild attenuation   Periphery  ORIGINALLY: Bull[place] RD from 0100-230, large HST at 0130; mild pavingstone degen inferiorly: superior SRF improved under bubble -- mild interval increase temporally, SRF shifted to 0430        Refraction    Wearing Rx      Sphere Cylinder Axis   Right -4.00 +0.75 100   Left -4.50 +0.75 100          IMAGING AND PROCEDURES  Imaging and Procedures for 10/02/2020  OCT, Retina - OU - Both Eyes       Right Eye Quality was good. Central Foveal Thickness: 251. Progression has been stable. Findings include abnormal foveal contour, no IRF, no SRF (irregular foveal contour ).   Left Eye Quality was borderline. Central Foveal Thickness: 264. Progression has been stable. Findings include normal foveal contour, no IRF, subretinal fluid (Persistent SRF temporal periphery, just outside ST arcades caught on widefield; +vitreous opacities).   Notes *Images captured and stored on drive  Diagnosis / Impression:  OD: abnormal foveal countour no IRF/SRF OS: persistent SRF temporal periphery, just outside ST arcades caught on widefield; +vitreous opacities   Clinical management:  See below  Abbreviations: NFP - Normal foveal profile. CME - cystoid macular edema. PED - pigment epithelial detachment. IRF - intraretinal fluid. SRF - subretinal fluid. EZ - ellipsoid zone. ERM - epiretinal membrane. ORA - outer retinal atrophy. ORT - outer  retinal tubulation. SRHM - subretinal hyper-reflective material. IRHM - intraretinal hyper-reflective material                 ASSESSMENT/PLAN:    ICD-10-CM   1. Left retinal detachment  H33.22   2. Retinal edema  H35.81 OCT, Retina - OU - Both Eyes  3. Retinal hole of right eye  H33.321   4. Diabetes mellitus type 2 without retinopathy ([place])  E11.9   5. Combined forms of age-related cataract of both eyes  H25.813     1,2. 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 - detached from 1 to 230 oclock w/ SRF still outside of ST arcades, large horseshoe tear at 130 - s/p pneumatic retinopexy OS (01.24.22) - today (POD1) - good gas bubble in place, questionable adherance to positioning, interval improvement in superior SRF, but some SRF shifted around temporally to 430.  - reviewed proper positioning of 45 deg right and forward head tilt and discussed importance of strict positioning - continue Maxitrol Ung QID OS - f/u tomorrow for POD2  3. Retinal hole, OD - small operculated hole at 0430 - The incidence, risk factors, and natural history of retinal tear was discussed with patient.   - Potential treatment options including laser retinopexy and cryotherapy discussed with patient. - recommend laser retinopexy OD once OS more stable - monitor  4. Diabetes mellitus, type 2 without retinopathy - The incidence, risk factors for progression, natural history and treatment options for diabetic retinopathy  were discussed with patient.   - The need for close monitoring of blood glucose, blood pressure, and serum lipids, avoiding cigarette or any type of tobacco, and  the need for long term follow up was also discussed with patient. - monitor  5. Mixed Cataract OU - The symptoms of cataract, surgical options, and treatments and risks were discussed with patient. - discussed diagnosis and progression - not yet visually significant - monitor for  now   Ophthalmic Meds Ordered this visit:  No orders of the defined types were placed in this encounter.     Return in about 1 day (around 10/03/2020) for f/u RD OS, DFE, OCT.  There are no Patient Instructions on file for this visit.   Explained the diagnoses, plan, and follow up with the patient and they expressed understanding.  Patient expressed understanding of the importance of proper follow up care.   This document serves as a record of services personally performed by Gardiner Sleeper, MD, PhD. It was created on their behalf by San Jetty. Owens Shark, OA an ophthalmic technician. The creation of this record is the provider's dictation and/or activities during the visit.    Electronically signed by: San Jetty. Owens Shark, [place] 01.25.2022 12:07 PM  Gardiner Sleeper, M.D., Ph.D. Diseases & Surgery of the Retina and Vitreous [facility]  I have reviewed the above documentation for accuracy and completeness, and I agree with the above. Gardiner Sleeper, M.D., Ph.D. 10/02/20 12:07 PM  Abbreviations: M myopia (nearsighted); A astigmatism; H hyperopia (farsighted); P presbyopia; Mrx spectacle prescription;  CTL contact lenses; OD right eye; OS left eye; OU both eyes  XT exotropia; ET esotropia; PEK punctate epithelial keratitis; PEE punctate epithelial erosions; DES dry eye syndrome; MGD meibomian gland dysfunction; ATs artificial tears; PFAT's preservative free artificial tears; [place] nuclear sclerotic cataract; PSC posterior subcapsular cataract; ERM epi-retinal membrane; PVD posterior vitreous detachment; RD retinal detachment; DM diabetes mellitus; DR diabetic retinopathy; NPDR non-proliferative diabetic retinopathy; PDR proliferative diabetic retinopathy; CSME clinically significant macular edema; DME diabetic macular edema; dbh dot blot hemorrhages; CWS cotton wool spot; POAG primary open angle glaucoma; C/D cup-to-disc ratio; HVF humphrey visual field; GVF goldmann visual field;  OCT optical coherence tomography; IOP intraocular pressure; BRVO Branch retinal vein occlusion; CRVO central retinal vein occlusion; CRAO central retinal artery occlusion; BRAO branch retinal artery occlusion; RT retinal tear; SB scleral buckle; PPV pars plana vitrectomy; VH Vitreous hemorrhage; PRP panretinal laser photocoagulation; IVK intravitreal kenalog; VMT vitreomacular traction; MH Macular hole;  NVD neovascularization of the disc; NVE neovascularization elsewhere; AREDS age related eye disease study; ARMD age related macular degeneration; POAG primary open angle glaucoma; EBMD epithelial/anterior basement membrane dystrophy; ACIOL anterior chamber intraocular lens; IOL intraocular lens; PCIOL posterior chamber intraocular lens; Phaco/IOL phacoemulsification with intraocular lens placement; [place] photorefractive keratectomy; LASIK laser assisted in situ keratomileusis; HTN hypertension; DM diabetes mellitus; COPD chronic obstructive pulmonary disease

## 2020-10-03 ENCOUNTER — Encounter (INDEPENDENT_AMBULATORY_CARE_PROVIDER_SITE_OTHER): Payer: Self-pay | Admitting: Ophthalmology

## 2020-10-03 ENCOUNTER — Other Ambulatory Visit (HOSPITAL_COMMUNITY)
Admission: RE | Admit: 2020-10-03 | Discharge: 2020-10-03 | Disposition: A | Discharge status: Home / Self Care | Payer: 59 | Source: Ambulatory Visit | Attending: Ophthalmology | Admitting: Ophthalmology

## 2020-10-03 ENCOUNTER — Encounter (HOSPITAL_COMMUNITY): Payer: Self-pay | Admitting: Ophthalmology

## 2020-10-03 ENCOUNTER — Other Ambulatory Visit: Payer: Self-pay

## 2020-10-03 ENCOUNTER — Ambulatory Visit (INDEPENDENT_AMBULATORY_CARE_PROVIDER_SITE_OTHER): Payer: 59 | Admitting: Ophthalmology

## 2020-10-03 DIAGNOSIS — Z01812 Encounter for preprocedural laboratory examination: Secondary | ICD-10-CM | POA: Diagnosis present

## 2020-10-03 DIAGNOSIS — E119 Type 2 diabetes mellitus without complications: Secondary | ICD-10-CM

## 2020-10-03 DIAGNOSIS — H3322 Serous retinal detachment, left eye: Secondary | ICD-10-CM

## 2020-10-03 DIAGNOSIS — H33321 Round hole, right eye: Secondary | ICD-10-CM | POA: Diagnosis not present

## 2020-10-03 DIAGNOSIS — Z20822 Contact with and (suspected) exposure to covid-19: Secondary | ICD-10-CM | POA: Diagnosis not present

## 2020-10-03 DIAGNOSIS — H3581 Retinal edema: Secondary | ICD-10-CM | POA: Diagnosis not present

## 2020-10-03 DIAGNOSIS — H25813 Combined forms of age-related cataract, bilateral: Secondary | ICD-10-CM

## 2020-10-03 LAB — SARS CORONAVIRUS 2 (TAT 6-24 HRS): SARS Coronavirus 2: NEGATIVE

## 2020-10-03 NOTE — Progress Notes (Signed)
PCP - Dr Maurice Small Cardiologist - n/a [facility] - Dr Mellody Dance  Chest x-ray - n/a EKG - 05/15/20 Stress Test - n/a ECHO - n/a Cardiac Cath - n/a  STOP now taking any Aspirin (unless otherwise instructed by your surgeon), Aleve, Naproxen, Ibuprofen, Motrin, Advil, Goody's, BC's, all herbal medications, fish oil, and all vitamins.   No metformin (used for weight loss) on day of surgery.  Coronavirus Screening Covid test is scheduled on 10/03/20 Do you have any of the following symptoms:  Cough yes/no: No Fever (>100.38F)  yes/no: No Runny nose yes/no: No Sore throat yes/no: No Difficulty breathing/shortness of breath  yes/no: No  Have you traveled in the last 14 days and where? yes/no: No  Patient verbalized understanding of instructions that were given via phone.

## 2020-10-03 NOTE — H&P (Signed)
Christina [name] is an 55 y.o. female.    Chief Complaint: retinal detachment, left eye  HPI: Pt with history of retinal detachment OS, s/p pneumatic retinopexy on 10/01/20. On post op follow up exams, the retinal detachment has failed to improve. After a discussion of the risks, benefits and alternatives, the patient has elected to proceed with surgical repair of her retinal detachment OS--25g PPV w/ endolaser and gas, possible scleral buckle OS, under general anesthesia.  Past Medical History:  Diagnosis Date  . Anemia   . Arthritis    hands and knees  . 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 Cataracts, bilateral   . Depression   . GAD (generalized anxiety disorder)   . Gallbladder problem   . History of ovarian cyst   . IBS (irritable bowel syndrome)   . Joint pain   . PONV (postoperative nausea and vomiting)    does well with scop patch  . Pre-diabetes    followed by pcp; on metformin for weight loss -Dr Mellody Dance  . Right knee meniscal tear   . Urgency of urination     Past Surgical History:  Procedure Laterality Date  . ABDOMINAL HYSTERECTOMY  05/1996   endometriosis  . ACHILLES TENDON REPAIR Right 2010;  revision 2011  . BLADDER SUSPENSION  2000  . BREAST BIOPSY Left 10/2015  . BREAST IMPLANT REMOVAL Bilateral 08/12/2017   Procedure: REMOVAL BILATERAL BREAST IMPLANTS;  Surgeon: Wallace Going, DO;  Location: [place];  Service: Plastics;  Laterality: Bilateral;  . BREAST RECONSTRUCTION WITH PLACEMENT OF TISSUE EXPANDER AND FLEX HD (ACELLULAR HYDRATED DERMIS) Bilateral 01/09/2016  . BREAST RECONSTRUCTION WITH PLACEMENT OF TISSUE EXPANDER AND FLEX HD (ACELLULAR HYDRATED DERMIS) Bilateral 01/09/2016   Procedure: BREAST RECONSTRUCTION WITH PLACEMENT OF TISSUE EXPANDER AND FLEX HD (ACELLULAR HYDRATED  DERMIS);  Surgeon: Loel Lofty Dillingham, DO;  Location: [place];  Service: Plastics;  Laterality: Bilateral;  . BREAST RECONSTRUCTION WITH PLACEMENT OF TISSUE EXPANDER AND FLEX HD (ACELLULAR HYDRATED DERMIS) Bilateral 05/29/2016   Procedure: PLACEMENT OF BILATERAL TISSUE EXPANDER AND FLEX HD (ACELLULAR HYDRATED DERMIS);  Surgeon: Wallace Going, DO;  Location: [place];  Service: Plastics;  Laterality: Bilateral;  . BREAST REDUCTION SURGERY Bilateral 11/26/2016   Procedure: BILATERAL BREAST CAPSULE CONTRACTURE RELASE;  Surgeon: Wallace Going, DO;  Location: [place];  Service: Plastics;  Laterality: Bilateral;  . [place]; 1989; 1991  . ELBOW SURGERY Right x3   last one 02-01-2019 @ [facility]  . FAT GRAFTING BILATERAL BREAST  08-09-2018  @[facility]   . INCISION AND DRAINAGE OF WOUND Bilateral 02/11/2016   Procedure: IRRIGATION AND DEBRIDEMENT OF BILATERAL BREAST POCKET;  Surgeon: Wallace Going, DO;  Location: [place];  Service: Plastics;  Laterality: Bilateral;  . KNEE ARTHROSCOPY WITH MEDIAL MENISECTOMY Right 12/20/2019   Procedure: KNEE ARTHROSCOPY WITH MEDIAL MENISECTOMY;  Surgeon: Marchia Bond, MD;  Location: [place];  Service: Orthopedics;  Laterality: Right;  . LAPAROSCOPIC APPENDECTOMY  04-07-2011   @[facility]    w/ Excision peritoneal lipoma and lysis adhesions  . LAPAROSCOPIC CHOLECYSTECTOMY  ~ 1999  . LIPOSUCTION WITH LIPOFILLING Bilateral 11/26/2016   Procedure: LIPOFILLING FOR SYMMETRY;  Surgeon: Wallace Going, DO;  Location: [place];  Service: Plastics;  Laterality: Bilateral;  .  LIPOSUCTION WITH LIPOFILLING Bilateral 01/21/2017   Procedure: BILATERAL BREAST  LIPOFILLING FOR ASYMMETRY;  Surgeon: Wallace Going, DO;  Location: [place];  Service: Plastics;  Laterality: Bilateral;  . LIPOSUCTION WITH LIPOFILLING Bilateral 06/28/2020   Procedure: Lipofilling  bilateral breasts for asymmetry;  Surgeon: Wallace Going, DO;  Location: [place];  Service: Plastics;  Laterality: Bilateral;  90 min  . MASTECTOMY Bilateral 01/09/2016  . NIPPLE SPARING MASTECTOMY/SENTINAL LYMPH NODE BIOPSY/RECONSTRUCTION/PLACEMENT OF TISSUE EXPANDER Bilateral 01/09/2016   Procedure: BILATERAL NIPPLE SPARING MASTECTOMY WITH LEFT SENTINAL LYMPH NODE BIOPSY ;  Surgeon: Stark Klein, MD;  Location: [place];  Service: General;  Laterality: Bilateral;  . REMOVAL OF BILATERAL TISSUE EXPANDERS WITH PLACEMENT OF BILATERAL BREAST IMPLANTS Bilateral 08/20/2016   Procedure: REMOVAL OF BILATERAL TISSUE EXPANDERS WITH PLACEMENT OF BILATERAL SILICONE IMPLANTS;  Surgeon: Wallace Going, DO;  Location: [place];  Service: Plastics;  Laterality: Bilateral;  . REMOVAL OF BILATERAL TISSUE EXPANDERS WITH PLACEMENT OF BILATERAL BREAST IMPLANTS Bilateral 11/05/2017   Procedure: REMOVAL OF BILATERAL TISSUE EXPANDERS WITH PLACEMENT OF BILATERAL BREAST SILICONE IMPLANTS;  Surgeon: Wallace Going, DO;  Location: [place];  Service: Plastics;  Laterality: Bilateral;  . REMOVAL OF TISSUE EXPANDER Bilateral 02/11/2016   Procedure: REMOVAL OF BILATERAL TISSUE EXPANDERS AND FLEX HD REMOVAL;  Surgeon: Wallace Going, DO;  Location: [place];  Service: Plastics;  Laterality: Bilateral;  . TISSUE EXPANDER PLACEMENT Bilateral 08/12/2017   Procedure: PLACEMENT OF BILATERAL TISSUE EXPANDER;  Surgeon: Wallace Going, DO;  Location: [place];  Service: Plastics;  Laterality: Bilateral;  . TUBAL LIGATION Bilateral 1991    Family History  Problem Relation Age of Onset  . Heart failure Father   . Prostate cancer Father 12  . Retinal detachment Father   . Colon polyps Mother        approx 2  . Other Mother        hx HPV and hysterectomy due to precancerous cells  . Depression Mother   . Anxiety disorder Mother    . Other Sister 62       hx of hysterectomy for unspecified reason; still has ovaries  . Other Sister 59       paternal half-sister hx of hysterectomy for unspecified reason; still has ovaries  . Bladder Cancer Maternal Uncle 79       not a smoker  . Kidney failure Maternal Grandmother   . Congestive Heart Failure Maternal Grandmother   . Colon cancer Maternal Grandmother 67  . Diabetes Maternal Grandmother   . Lung cancer Maternal Grandfather 69       smoker  . Breast cancer Paternal Grandmother        dx. early 59s; w/ hx of trauma to breast  . Crohn's disease Daughter   . Lung cancer Maternal Uncle 12       smoker   Social History:  reports that she quit smoking about 12 years ago. Her smoking use included cigarettes. She has a 10.00 pack-year smoking history. She has never used smokeless tobacco. She reports that she does not drink alcohol and does not use drugs.  Allergies: No Known Allergies  No medications prior to admission.    Review of systems otherwise negative  Height 5\' 7"  (1.702 m).  Physical exam: Mental status: oriented x3. Eyes: See eye exam associated with this date of surgery Ears, Nose, Throat: within normal limits Neck: Within Normal  limits General: within normal limits Chest: Within normal limits Breast: deferred Heart: Within normal limits Abdomen: Within normal limits GU: deferred Extremities: within normal limits Skin: within normal limits  Assessment/Plan 1. Retinal detachment, left eye  Plan: To [facility] for 25g PPV w/ endolaser and gas, OS, possible scleral buckle, under general anesthesia - case scheduled for 1.27.22, 1pm, [facility] OR 08  Gardiner Sleeper, M.D., Ph.D. Vitreoretinal Surgeon [facility]

## 2020-10-03 NOTE — Progress Notes (Signed)
[place] [facility] Note  10/03/2020     CHIEF COMPLAINT Patient presents for Post-op Follow-up   HISTORY OF PRESENT ILLNESS: Christina [name] is a 55 y.o. female who presents to the clinic today for:   HPI    Post-op Follow-up    In left eye.  Discomfort includes itching.  Vision is blurred at distance.  I, the attending physician,  performed the HPI with the patient and updated documentation appropriately.          Comments    2 day post op OS- Doing well.  Eye is itching.  Denies pain or discomfort.  Using Maxitrol ung QID OS.  (Pt is on metformin but does not check BS)       Last edited by Bernarda Caffey, MD on 10/03/2020  8:50 AM. (History)    pt states she had a hard time sleeping last night, she states last night it felt like she had a "broken rock" in her eye, but that has improved today  Referring physician: Maurice Small, MD [address],  [place] [id number]  HISTORICAL INFORMATION:   Selected notes from the MEDICAL RECORD NUMBER Referred by Dr. Quentin Ore for RD OS LEE:  Ocular Hx- PMH-    CURRENT MEDICATIONS: Current Outpatient Medications (Ophthalmic Drugs)  Medication Sig  . neomycin-polymyxin b-dexamethasone (MAXITROL) 3.5-10000-0.1 OINT Place 1 application into the left eye 4 (four) times daily.   No current facility-administered medications for this visit. (Ophthalmic Drugs)   Current Outpatient Medications (Other)  Medication Sig  . ALPRAZolam (XANAX) 0.25 MG tablet Take 0.25 mg by mouth daily as needed.  Marland Kitchen amitriptyline (ELAVIL) 100 MG tablet Take 0.5 tablets (50 mg total) by mouth at bedtime.  Marland Kitchen FLUoxetine (PROZAC) 20 MG tablet 1 tablet daily  . metFORMIN (GLUCOPHAGE) 500 MG tablet Take 1 tablet (500 mg total) by mouth 2 (two) times daily with a meal.  . vitamin B-12 (CYANOCOBALAMIN) 500 MCG tablet Take 1 tablet (500 mcg total) by mouth daily.  . Vitamin D, Ergocalciferol, (DRISDOL) 1.25 MG (50000  UNIT) CAPS capsule Take 1 capsule (50,000 Units total) by mouth every 7 (seven) days.   No current facility-administered medications for this visit. (Other)      REVIEW OF SYSTEMS: ROS    Positive for: Endocrine, Eyes   Negative for: Constitutional, Gastrointestinal, Neurological, Skin, Genitourinary, Musculoskeletal, HENT, Cardiovascular, Respiratory, Psychiatric, Allergic/Imm, Heme/Lymph   Last edited by Leonie Douglas, COA on 10/03/2020  8:35 AM. (History)       ALLERGIES No Known Allergies  PAST MEDICAL HISTORY Past Medical History:  Diagnosis Date  . Anemia   . Arthritis    hands and knees  . 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 Depression   . GAD (generalized anxiety disorder)   . Gallbladder problem   . History of ovarian cyst   . IBS (irritable bowel syndrome)   . Joint pain   . PONV (postoperative nausea and vomiting)    does well with scop patch  . Pre-diabetes    followed by pcp  . Right knee meniscal tear   . Urgency of urination    Past Surgical History:  Procedure Laterality Date  . ABDOMINAL HYSTERECTOMY  05/1996   endometriosis  . ACHILLES TENDON REPAIR Right 2010;  revision  2011  . BLADDER SUSPENSION  2000  . BREAST BIOPSY Left 10/2015  . BREAST IMPLANT REMOVAL Bilateral 08/12/2017   Procedure: REMOVAL BILATERAL BREAST IMPLANTS;  Surgeon: Wallace Going, DO;  Location: [place];  Service: Plastics;  Laterality: Bilateral;  . BREAST RECONSTRUCTION WITH PLACEMENT OF TISSUE EXPANDER AND FLEX HD (ACELLULAR HYDRATED DERMIS) Bilateral 01/09/2016  . BREAST RECONSTRUCTION WITH PLACEMENT OF TISSUE EXPANDER AND FLEX HD (ACELLULAR HYDRATED DERMIS) Bilateral 01/09/2016   Procedure: BREAST RECONSTRUCTION WITH PLACEMENT OF TISSUE EXPANDER AND FLEX HD (ACELLULAR HYDRATED DERMIS);  Surgeon:  Loel Lofty Dillingham, DO;  Location: [place];  Service: Plastics;  Laterality: Bilateral;  . BREAST RECONSTRUCTION WITH PLACEMENT OF TISSUE EXPANDER AND FLEX HD (ACELLULAR HYDRATED DERMIS) Bilateral 05/29/2016   Procedure: PLACEMENT OF BILATERAL TISSUE EXPANDER AND FLEX HD (ACELLULAR HYDRATED DERMIS);  Surgeon: Wallace Going, DO;  Location: [place];  Service: Plastics;  Laterality: Bilateral;  . BREAST REDUCTION SURGERY Bilateral 11/26/2016   Procedure: BILATERAL BREAST CAPSULE CONTRACTURE RELASE;  Surgeon: Wallace Going, DO;  Location: [place];  Service: Plastics;  Laterality: Bilateral;  . [place]; 1989; 1991  . ELBOW SURGERY Right x3   last one 02-01-2019 @ [facility]  . FAT GRAFTING BILATERAL BREAST  08-09-2018  _0   . INCISION AND DRAINAGE OF WOUND Bilateral 02/11/2016   Procedure: IRRIGATION AND DEBRIDEMENT OF BILATERAL BREAST POCKET;  Surgeon: Wallace Going, DO;  Location: [place];  Service: Plastics;  Laterality: Bilateral;  . KNEE ARTHROSCOPY WITH MEDIAL MENISECTOMY Right 12/20/2019   Procedure: KNEE ARTHROSCOPY WITH MEDIAL MENISECTOMY;  Surgeon: Marchia Bond, MD;  Location: [place];  Service: Orthopedics;  Laterality: Right;  . LAPAROSCOPIC APPENDECTOMY  04-07-2011   _1    w/ Excision peritoneal lipoma and lysis adhesions  . LAPAROSCOPIC CHOLECYSTECTOMY  ~ 1999  . LIPOSUCTION WITH LIPOFILLING Bilateral 11/26/2016   Procedure: LIPOFILLING FOR SYMMETRY;  Surgeon: Wallace Going, DO;  Location: [place];  Service: Plastics;  Laterality: Bilateral;  . LIPOSUCTION WITH LIPOFILLING Bilateral 01/21/2017   Procedure: BILATERAL BREAST  LIPOFILLING FOR ASYMMETRY;  Surgeon: Wallace Going, DO;  Location: [place];  Service: Plastics;  Laterality: Bilateral;  . LIPOSUCTION WITH LIPOFILLING Bilateral 06/28/2020   Procedure: Lipofilling bilateral breasts for  asymmetry;  Surgeon: Wallace Going, DO;  Location: [place];  Service: Plastics;  Laterality: Bilateral;  90 min  . MASTECTOMY Bilateral 01/09/2016  . NIPPLE SPARING MASTECTOMY/SENTINAL LYMPH NODE BIOPSY/RECONSTRUCTION/PLACEMENT OF TISSUE EXPANDER Bilateral 01/09/2016   Procedure: BILATERAL NIPPLE SPARING MASTECTOMY WITH LEFT SENTINAL LYMPH NODE BIOPSY ;  Surgeon: Stark Klein, MD;  Location: [place];  Service: General;  Laterality: Bilateral;  . REMOVAL OF BILATERAL TISSUE EXPANDERS WITH PLACEMENT OF BILATERAL BREAST IMPLANTS Bilateral 08/20/2016   Procedure: REMOVAL OF BILATERAL TISSUE EXPANDERS WITH PLACEMENT OF BILATERAL SILICONE IMPLANTS;  Surgeon: Wallace Going, DO;  Location: [place];  Service: Plastics;  Laterality: Bilateral;  . REMOVAL OF BILATERAL TISSUE EXPANDERS WITH PLACEMENT OF BILATERAL BREAST IMPLANTS Bilateral 11/05/2017   Procedure: REMOVAL OF BILATERAL TISSUE EXPANDERS WITH PLACEMENT OF BILATERAL BREAST SILICONE IMPLANTS;  Surgeon: Wallace Going, DO;  Location: [place];  Service: Plastics;  Laterality: Bilateral;  . REMOVAL OF TISSUE EXPANDER Bilateral 02/11/2016   Procedure: REMOVAL OF BILATERAL TISSUE EXPANDERS AND FLEX HD REMOVAL;  Surgeon: Wallace Going, DO;  Location: [place];  Service: Plastics;  Laterality: Bilateral;  . TISSUE EXPANDER PLACEMENT Bilateral 08/12/2017   Procedure: PLACEMENT OF BILATERAL TISSUE EXPANDER;  Surgeon: Wallace Going, DO;  Location: [place];  Service: Plastics;  Laterality: Bilateral;  . TUBAL LIGATION Bilateral 1991    FAMILY HISTORY Family History  Problem Relation Age of Onset  . Heart failure Father   . Prostate cancer Father 43  . Retinal detachment Father   . Colon polyps Mother        approx 2  . Other Mother        hx HPV and hysterectomy due to precancerous cells  . Depression Mother   . Anxiety disorder Mother   .  Other Sister 67       hx of hysterectomy for unspecified reason; still has ovaries  . Other Sister 71       paternal half-sister hx of hysterectomy for unspecified reason; still has ovaries  . Bladder Cancer Maternal Uncle 79       not a smoker  . Kidney failure Maternal Grandmother   . Congestive Heart Failure Maternal Grandmother   . Colon cancer Maternal Grandmother 16  . Diabetes Maternal Grandmother   . Lung cancer Maternal Grandfather 12       smoker  . Breast cancer Paternal Grandmother        dx. early 6s; w/ hx of trauma to breast  . Crohn's disease Daughter   . Lung cancer Maternal Uncle 92       smoker    SOCIAL HISTORY Social History   Tobacco Use  . Smoking status: Former Smoker    Packs/day: 1.00    Years: 10.00    Pack years: 10.00    Types: Cigarettes    Quit date: 06/08/2008    Years since quitting: 12.3  . Smokeless tobacco: Never Used  Vaping Use  . Vaping Use: Never used  Substance Use Topics  . Alcohol use: No  . Drug use: Never         OPHTHALMIC EXAM:  Base Eye Exam    Visual Acuity (Snellen - Linear)      Right Left   Dist cc 20/25 20/60   Dist ph cc  NI   Correction: Glasses       Tonometry (Tonopen, 8:41 AM)      Right Left   Pressure def 17       Pupils      Dark Light Shape React APD   Right 4 3 Round Brisk None   Left 4 3 Round Sluggish None       Visual Fields (Counting fingers)      Left Right     Full   Restrictions Partial outer superior nasal deficiency        Neuro/Psych    Oriented x3: Yes   Mood/Affect: Normal       Dilation    Left eye: 1.0% Mydriacyl, 2.5% Phenylephrine @ 8:41 AM        Slit Lamp and Fundus Exam    Slit Lamp Exam      Right Left   Lids/Lashes Dermatochalasis - upper lid Dermatochalasis - upper lid   Conjunctiva/Sclera White and quiet Pneumatic Chemosis temporally, Subconjunctival hemorrhage superiorly   Cornea 2+ Punctate epithelial erosions, mild tear film debris 1-2+ Punctate  epithelial erosions, mild tear film debris   Anterior Chamber Deep and quiet Deep and quiet   Iris Round and dilated Round and dilated   Lens 1-2+ Nuclear sclerosis,  1-2+ Cortical cataract 1-2+ Nuclear sclerosis, 1-2+ Cortical cataract   Vitreous Vitreous syneresis, Posterior vitreous detachment, Weiss ring Vitreous syneresis, +pigment, Posterior vitreous detachment, vitreous condensations, +debris, 25% with satellite bubble nasally       Fundus Exam      Right Left   Disc  Pink and Sharp, mild tilt, temporal PPA   C/D Ratio 0.3 0.4   Macula  Flat, Blunted foveal reflex, RPE mottling, shallow SRF almost to ST arcades   Vessels  mild attenuation   Periphery  Good early cryo changes, SRF extending to 0700; ORIGINALLY: [place] RD from 0100-230, large HST at 0130; mild pavingstone degen inferiorly          IMAGING AND PROCEDURES  Imaging and Procedures for 10/03/2020  OCT, Retina - OU - Both Eyes       Right Eye Quality was good. Central Foveal Thickness: 251. Progression has been stable. Findings include abnormal foveal contour, no IRF, no SRF (irregular foveal contour ).   Left Eye Quality was borderline. Central Foveal Thickness: 268. Progression has worsened. Findings include normal foveal contour, subretinal fluid, intraretinal fluid (Interval shifting of SRF inferiorly -- +SRF IT mac; +vitreous opacities).   Notes *Images captured and stored on drive  Diagnosis / Impression:  OD: abnormal foveal countour no IRF/SRF OS: Interval shifting of SRF inferiorly -- +SRF IT mac; +vitreous opacities  Clinical management:  See below  Abbreviations: NFP - Normal foveal profile. CME - cystoid macular edema. PED - pigment epithelial detachment. IRF - intraretinal fluid. SRF - subretinal fluid. EZ - ellipsoid zone. ERM - epiretinal membrane. ORA - outer retinal atrophy. ORT - outer retinal tubulation. SRHM - subretinal hyper-reflective material. IRHM - intraretinal hyper-reflective  material                 ASSESSMENT/PLAN:    ICD-10-CM   1. Left retinal detachment  H33.22   2. Retinal edema  H35.81 OCT, Retina - OU - Both Eyes  3. Retinal hole of right eye  H33.321   4. Diabetes mellitus type 2 without retinopathy ([place])  E11.9   5. Combined forms of age-related cataract of both eyes  H25.813     1,2. 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 - detached from 1 to 230 oclock w/ SRF still outside of ST arcades, large horseshoe tear at 130 - s/p pneumatic retinopexy OS (01.24.22) - today (POD2) - good gas bubble in place, improved positioning, interval improvement in superior SRF, but interval increase in inferior SRF -- shifted around 0700 (inf nasal quad).  - reviewed proper positioning of 45 deg right and forward head tilt and discussed importance of strict positioning - continue Maxitrol Ung QID OS - f/u tomorrow 8 am -- if no improvement, will need RD repair in OR  3. Retinal hole, OD - small operculated hole at 0430 - The incidence, risk factors, and natural history of retinal tear was discussed with patient.   - Potential treatment options including laser retinopexy and cryotherapy discussed with patient. - recommend laser retinopexy OD once OS more stable - monitor  4. Diabetes mellitus, type 2 without retinopathy - The incidence, risk factors for progression, natural history and treatment options for diabetic retinopathy  were discussed with patient.   - The need for close monitoring of blood glucose, blood pressure, and serum lipids, avoiding cigarette or any type of tobacco, and the need for long term follow up was also discussed with patient. - monitor  5. Mixed Cataract OU - The symptoms of cataract, surgical options, and treatments and risks were discussed with patient. - discussed diagnosis and progression - not yet visually significant - monitor for now   Ophthalmic Meds Ordered this visit:  No  orders of the defined types were placed in this encounter.     Return in about 1 day (around 10/04/2020) for f/u RD OS, DFE, OCT.  There are no Patient Instructions on file for this visit.   Explained the diagnoses, plan, and follow up with the patient and they expressed understanding.  Patient expressed understanding of the importance of proper follow up care.    Gardiner Sleeper, M.D., Ph.D. Diseases & Surgery of the Retina and [place] 10/03/2020   I have reviewed the above documentation for accuracy and completeness, and I agree with the above. Gardiner Sleeper, M.D., Ph.D. 10/03/20 9:41 AM   Abbreviations: M myopia (nearsighted); A astigmatism; H hyperopia (farsighted); P presbyopia; Mrx spectacle prescription;  CTL contact lenses; OD right eye; OS left eye; OU both eyes  XT exotropia; ET esotropia; PEK punctate epithelial keratitis; PEE punctate epithelial erosions; DES dry eye syndrome; MGD meibomian gland dysfunction; ATs artificial tears; PFAT's preservative free artificial tears; [place] nuclear sclerotic cataract; PSC posterior subcapsular cataract; ERM epi-retinal membrane; PVD posterior vitreous detachment; RD retinal detachment; DM diabetes mellitus; DR diabetic retinopathy; NPDR non-proliferative diabetic retinopathy; PDR proliferative diabetic retinopathy; CSME clinically significant macular edema; DME diabetic macular edema; dbh dot blot hemorrhages; CWS cotton wool spot; POAG primary open angle glaucoma; C/D cup-to-disc ratio; HVF humphrey visual field; GVF goldmann visual field; OCT optical coherence tomography; IOP intraocular pressure; BRVO Branch retinal vein occlusion; CRVO central retinal vein occlusion; CRAO central retinal artery occlusion; BRAO branch retinal artery occlusion; RT retinal tear; SB scleral buckle; PPV pars plana vitrectomy; VH Vitreous hemorrhage; PRP panretinal laser photocoagulation; IVK intravitreal kenalog; VMT  vitreomacular traction; MH Macular hole;  NVD neovascularization of the disc; NVE neovascularization elsewhere; AREDS age related eye disease study; ARMD age related macular degeneration; POAG primary open angle glaucoma; EBMD epithelial/anterior basement membrane dystrophy; ACIOL anterior chamber intraocular lens; IOL intraocular lens; PCIOL posterior chamber intraocular lens; Phaco/IOL phacoemulsification with intraocular lens placement; [place] photorefractive keratectomy; LASIK laser assisted in situ keratomileusis; HTN hypertension; DM diabetes mellitus; COPD chronic obstructive pulmonary disease

## 2020-10-04 ENCOUNTER — Ambulatory Visit (HOSPITAL_COMMUNITY): Payer: 59 | Admitting: Anesthesiology

## 2020-10-04 ENCOUNTER — Ambulatory Visit (HOSPITAL_COMMUNITY)
Admission: RE | Admit: 2020-10-04 | Discharge: 2020-10-04 | Disposition: A | Discharge status: Home / Self Care | Payer: 59 | Attending: Ophthalmology | Admitting: Ophthalmology

## 2020-10-04 ENCOUNTER — Ambulatory Visit (INDEPENDENT_AMBULATORY_CARE_PROVIDER_SITE_OTHER): Payer: 59 | Admitting: Ophthalmology

## 2020-10-04 ENCOUNTER — Encounter (HOSPITAL_COMMUNITY): Payer: Self-pay | Admitting: Ophthalmology

## 2020-10-04 ENCOUNTER — Encounter (HOSPITAL_COMMUNITY)
Admission: RE | Disposition: A | Discharge status: Home / Self Care | Payer: Self-pay | Source: Home / Self Care | Attending: Ophthalmology

## 2020-10-04 DIAGNOSIS — Z87891 Personal history of nicotine dependence: Secondary | ICD-10-CM | POA: Diagnosis not present

## 2020-10-04 DIAGNOSIS — H33002 Unspecified retinal detachment with retinal break, left eye: Secondary | ICD-10-CM | POA: Insufficient documentation

## 2020-10-04 DIAGNOSIS — H3322 Serous retinal detachment, left eye: Secondary | ICD-10-CM

## 2020-10-04 DIAGNOSIS — H33301 Unspecified retinal break, right eye: Secondary | ICD-10-CM | POA: Diagnosis not present

## 2020-10-04 DIAGNOSIS — Z853 Personal history of malignant neoplasm of breast: Secondary | ICD-10-CM | POA: Diagnosis not present

## 2020-10-04 DIAGNOSIS — H25813 Combined forms of age-related cataract, bilateral: Secondary | ICD-10-CM

## 2020-10-04 DIAGNOSIS — H338 Other retinal detachments: Secondary | ICD-10-CM | POA: Diagnosis not present

## 2020-10-04 DIAGNOSIS — H35411 Lattice degeneration of retina, right eye: Secondary | ICD-10-CM

## 2020-10-04 DIAGNOSIS — H3581 Retinal edema: Secondary | ICD-10-CM

## 2020-10-04 DIAGNOSIS — H33321 Round hole, right eye: Secondary | ICD-10-CM

## 2020-10-04 DIAGNOSIS — E119 Type 2 diabetes mellitus without complications: Secondary | ICD-10-CM

## 2020-10-04 HISTORY — PX: GAS/FLUID EXCHANGE: SHX5334

## 2020-10-04 HISTORY — PX: GAS INSERTION: SHX5336

## 2020-10-04 HISTORY — DX: Unspecified cataract: H26.9

## 2020-10-04 HISTORY — PX: SCLERAL BUCKLE: SHX5340

## 2020-10-04 HISTORY — PX: PHOTOCOAGULATION WITH LASER: SHX6027

## 2020-10-04 HISTORY — PX: RETINAL DETACHMENT SURGERY: SHX105

## 2020-10-04 HISTORY — PX: EYE SURGERY: SHX253

## 2020-10-04 HISTORY — PX: VITRECTOMY 25 GAUGE WITH SCLERAL BUCKLE: SHX6183

## 2020-10-04 HISTORY — PX: LASER PHOTO ABLATION: SHX5942

## 2020-10-04 LAB — GLUCOSE, CAPILLARY
Glucose-Capillary: 97 mg/dL (ref 70–99)
Glucose-Capillary: 62 mg/dL — ABNORMAL LOW (ref 70–99)
Glucose-Capillary: 108 mg/dL — ABNORMAL HIGH (ref 70–99)
Glucose-Capillary: 117 mg/dL — ABNORMAL HIGH (ref 70–99)
Glucose-Capillary: 129 mg/dL — ABNORMAL HIGH (ref 70–99)

## 2020-10-04 SURGERY — VITRECTOMY, USING 25-GAUGE INSTRUMENTS, WITH SCLERAL BUCKLING
Anesthesia: General | Site: Eye | Laterality: Right

## 2020-10-04 MED ORDER — STERILE WATER FOR INJECTION IJ SOLN
INTRAMUSCULAR | Status: AC
  Filled 2020-10-04: qty 10

## 2020-10-04 MED ORDER — DEXTROSE 50 % IV SOLN: 12.5000 g | INTRAVENOUS | Status: AC

## 2020-10-04 MED ORDER — TROPICAMIDE 1 % OP SOLN
1.0000 [drp] | OPHTHALMIC | Status: AC | PRN
  Administered 2020-10-04 (×3): 1 [drp] via OPHTHALMIC
  Filled 2020-10-04: qty 15

## 2020-10-04 MED ORDER — FENTANYL CITRATE (PF) 250 MCG/5ML IJ SOLN
INTRAMUSCULAR | Status: DC | PRN
  Administered 2020-10-04: 50 ug via INTRAVENOUS
  Administered 2020-10-04: 100 ug via INTRAVENOUS
  Administered 2020-10-04 (×3): 50 ug via INTRAVENOUS

## 2020-10-04 MED ORDER — DEXAMETHASONE SODIUM PHOSPHATE 10 MG/ML IJ SOLN
INTRAMUSCULAR | Status: AC
  Filled 2020-10-04: qty 2

## 2020-10-04 MED ORDER — ROCURONIUM BROMIDE 10 MG/ML (PF) SYRINGE
PREFILLED_SYRINGE | INTRAVENOUS | Status: DC | PRN
  Administered 2020-10-04: 100 mg via INTRAVENOUS
  Administered 2020-10-04 (×2): 20 mg via INTRAVENOUS

## 2020-10-04 MED ORDER — POLYMYXIN B SULFATE 500000 UNITS IJ SOLR
INTRAMUSCULAR | Status: AC
  Filled 2020-10-04: qty 500000

## 2020-10-04 MED ORDER — AMISULPRIDE (ANTIEMETIC) 5 MG/2ML IV SOLN: 10.0000 mg | Freq: Once | INTRAVENOUS | Status: DC | PRN

## 2020-10-04 MED ORDER — ROCURONIUM BROMIDE 10 MG/ML (PF) SYRINGE
PREFILLED_SYRINGE | INTRAVENOUS | Status: AC
  Filled 2020-10-04: qty 40

## 2020-10-04 MED ORDER — PROPARACAINE HCL 0.5 % OP SOLN
1.0000 [drp] | OPHTHALMIC | Status: AC | PRN
  Administered 2020-10-04 (×3): 1 [drp] via OPHTHALMIC
  Filled 2020-10-04: qty 15

## 2020-10-04 MED ORDER — ATROPINE SULFATE 1 % OP SOLN
1.0000 [drp] | OPHTHALMIC | Status: AC | PRN
  Administered 2020-10-04 (×3): 1 [drp] via OPHTHALMIC
  Filled 2020-10-04: qty 5

## 2020-10-04 MED ORDER — TRIAMCINOLONE ACETONIDE 40 MG/ML IJ SUSP
INTRAMUSCULAR | Status: AC
  Filled 2020-10-04: qty 5

## 2020-10-04 MED ORDER — LIDOCAINE 2% (20 MG/ML) 5 ML SYRINGE
INTRAMUSCULAR | Status: DC | PRN
  Administered 2020-10-04: 80 mg via INTRAVENOUS

## 2020-10-04 MED ORDER — SODIUM CHLORIDE 0.9 % IV SOLN: INTRAVENOUS | Status: DC | PRN

## 2020-10-04 MED ORDER — BSS IO SOLN
INTRAOCULAR | Status: DC | PRN
  Administered 2020-10-04 (×2): 15 mL via INTRAOCULAR

## 2020-10-04 MED ORDER — DORZOLAMIDE HCL-TIMOLOL MAL 2-0.5 % OP SOLN
OPHTHALMIC | Status: DC | PRN
  Administered 2020-10-04: 1 [drp] via OPHTHALMIC

## 2020-10-04 MED ORDER — 0.9 % SODIUM CHLORIDE (POUR BTL) OPTIME
TOPICAL | Status: DC | PRN
  Administered 2020-10-04: 1000 mL

## 2020-10-04 MED ORDER — DORZOLAMIDE HCL-TIMOLOL MAL 2-0.5 % OP SOLN
OPHTHALMIC | Status: AC
  Filled 2020-10-04: qty 10

## 2020-10-04 MED ORDER — BRIMONIDINE TARTRATE 0.2 % OP SOLN
OPHTHALMIC | Status: DC | PRN
  Administered 2020-10-04: 1 [drp] via OPHTHALMIC

## 2020-10-04 MED ORDER — BRIMONIDINE TARTRATE 0.2 % OP SOLN
OPHTHALMIC | Status: AC
  Filled 2020-10-04: qty 5

## 2020-10-04 MED ORDER — BUPIVACAINE HCL (PF) 0.75 % IJ SOLN
INTRAMUSCULAR | Status: DC | PRN
  Administered 2020-10-04: 5 mL

## 2020-10-04 MED ORDER — DEXTROSE 5 % IV SOLN
INTRAVENOUS | Status: AC
  Filled 2020-10-04: qty 100

## 2020-10-04 MED ORDER — EPINEPHRINE PF 1 MG/ML IJ SOLN
INTRAOCULAR | Status: DC | PRN
  Administered 2020-10-04: 500 mL

## 2020-10-04 MED ORDER — SCOPOLAMINE 1 MG/3DAYS TD PT72
1.0000 | MEDICATED_PATCH | TRANSDERMAL | Status: DC
  Administered 2020-10-04: 1.5 mg via TRANSDERMAL
  Filled 2020-10-04: qty 1

## 2020-10-04 MED ORDER — NA CHONDROIT SULF-NA HYALURON 40-30 MG/ML IO SOSY
INTRAOCULAR | Status: AC
  Filled 2020-10-04: qty 1

## 2020-10-04 MED ORDER — TRIAMCINOLONE ACETONIDE 40 MG/ML IJ SUSP
INTRAMUSCULAR | Status: DC | PRN
  Administered 2020-10-04: 40 mg

## 2020-10-04 MED ORDER — INDOCYANINE GREEN 25 MG IV SOLR
INTRAVENOUS | Status: AC
  Filled 2020-10-04: qty 10

## 2020-10-04 MED ORDER — MIDAZOLAM HCL 2 MG/2ML IJ SOLN
INTRAMUSCULAR | Status: AC
  Filled 2020-10-04: qty 2

## 2020-10-04 MED ORDER — PREDNISOLONE ACETATE 1 % OP SUSP
OPHTHALMIC | Status: DC | PRN
  Administered 2020-10-04 (×2): 1 [drp] via OPHTHALMIC

## 2020-10-04 MED ORDER — LIDOCAINE HCL 1 % IJ SOLN
INTRAMUSCULAR | Status: DC | PRN
  Administered 2020-10-04: 5 mL

## 2020-10-04 MED ORDER — HYDROCODONE-ACETAMINOPHEN 5-325 MG PO TABS
1.0000 | ORAL_TABLET | ORAL | 0 refills | Status: DC | PRN
Start: 2020-10-04 — End: 2021-01-24

## 2020-10-04 MED ORDER — PHENYLEPHRINE 40 MCG/ML (10ML) SYRINGE FOR IV PUSH (FOR BLOOD PRESSURE SUPPORT)
PREFILLED_SYRINGE | INTRAVENOUS | Status: AC
  Filled 2020-10-04: qty 30

## 2020-10-04 MED ORDER — ATROPINE SULFATE 1 % OP SOLN
OPHTHALMIC | Status: DC | PRN
  Administered 2020-10-04: 1 [drp] via OPHTHALMIC

## 2020-10-04 MED ORDER — GATIFLOXACIN 0.5 % OP SOLN
OPHTHALMIC | Status: DC | PRN
  Administered 2020-10-04: 1 [drp] via OPHTHALMIC

## 2020-10-04 MED ORDER — MIDAZOLAM HCL 2 MG/2ML IJ SOLN
INTRAMUSCULAR | Status: DC | PRN
  Administered 2020-10-04: 2 mg via INTRAVENOUS

## 2020-10-04 MED ORDER — LIDOCAINE HCL (PF) 2 % IJ SOLN
INTRAMUSCULAR | Status: AC
  Filled 2020-10-04: qty 10

## 2020-10-04 MED ORDER — FENTANYL CITRATE (PF) 250 MCG/5ML IJ SOLN
INTRAMUSCULAR | Status: AC
  Filled 2020-10-04: qty 5

## 2020-10-04 MED ORDER — STERILE WATER FOR INJECTION IJ SOLN
INTRAMUSCULAR | Status: DC | PRN
  Administered 2020-10-04: 20 mL

## 2020-10-04 MED ORDER — NA CHONDROIT SULF-NA HYALURON 40-30 MG/ML IO SOSY
INTRAOCULAR | Status: DC | PRN
  Administered 2020-10-04: 0.5 mL via INTRAOCULAR

## 2020-10-04 MED ORDER — ONDANSETRON HCL 4 MG/2ML IJ SOLN
INTRAMUSCULAR | Status: AC
  Filled 2020-10-04: qty 4

## 2020-10-04 MED ORDER — PHENYLEPHRINE HCL 10 % OP SOLN
1.0000 [drp] | OPHTHALMIC | Status: AC | PRN
  Administered 2020-10-04 (×3): 1 [drp] via OPHTHALMIC
  Filled 2020-10-04: qty 5

## 2020-10-04 MED ORDER — PREDNISOLONE ACETATE 1 % OP SUSP
OPHTHALMIC | Status: AC
  Filled 2020-10-04: qty 5

## 2020-10-04 MED ORDER — FENTANYL CITRATE (PF) 100 MCG/2ML IJ SOLN: 25.0000 ug | INTRAMUSCULAR | Status: DC | PRN

## 2020-10-04 MED ORDER — DEXAMETHASONE SODIUM PHOSPHATE 10 MG/ML IJ SOLN
INTRAMUSCULAR | Status: AC
  Filled 2020-10-04: qty 1

## 2020-10-04 MED ORDER — ONDANSETRON HCL 4 MG/2ML IJ SOLN
INTRAMUSCULAR | Status: DC | PRN
  Administered 2020-10-04: 4 mg via INTRAVENOUS

## 2020-10-04 MED ORDER — CEFTAZIDIME 1 G IJ SOLR
INTRAMUSCULAR | Status: AC
  Filled 2020-10-04: qty 1

## 2020-10-04 MED ORDER — TOBRAMYCIN-DEXAMETHASONE 0.3-0.1 % OP OINT
TOPICAL_OINTMENT | OPHTHALMIC | Status: AC
  Filled 2020-10-04: qty 3.5

## 2020-10-04 MED ORDER — LIDOCAINE HCL (PF) 1 % IJ SOLN
INTRAMUSCULAR | Status: AC
  Filled 2020-10-04: qty 5

## 2020-10-04 MED ORDER — EPHEDRINE 5 MG/ML INJ
INTRAVENOUS | Status: AC
  Filled 2020-10-04: qty 10

## 2020-10-04 MED ORDER — LIDOCAINE 2% (20 MG/ML) 5 ML SYRINGE
INTRAMUSCULAR | Status: AC
  Filled 2020-10-04: qty 15

## 2020-10-04 MED ORDER — DEXAMETHASONE SODIUM PHOSPHATE 10 MG/ML IJ SOLN
INTRAMUSCULAR | Status: DC | PRN
  Administered 2020-10-04: 10 mg via INTRAVENOUS

## 2020-10-04 MED ORDER — STERILE WATER FOR INJECTION IJ SOLN
INTRAMUSCULAR | Status: DC | PRN
  Administered 2020-10-04: 400 mL

## 2020-10-04 MED ORDER — PROPOFOL 10 MG/ML IV BOLUS
INTRAVENOUS | Status: DC | PRN
  Administered 2020-10-04: 30 mg via INTRAVENOUS
  Administered 2020-10-04: 120 mg via INTRAVENOUS

## 2020-10-04 MED ORDER — LIDOCAINE HCL (PF) 4 % IJ SOLN
INTRAMUSCULAR | Status: AC
  Filled 2020-10-04: qty 5

## 2020-10-04 MED ORDER — BSS PLUS IO SOLN
INTRAOCULAR | Status: AC
  Filled 2020-10-04: qty 500

## 2020-10-04 MED ORDER — BACITRACIN-POLYMYXIN B 500-10000 UNIT/GM OP OINT
TOPICAL_OINTMENT | OPHTHALMIC | Status: AC
  Filled 2020-10-04: qty 3.5

## 2020-10-04 MED ORDER — BACITRACIN-POLYMYXIN B 500-10000 UNIT/GM OP OINT
TOPICAL_OINTMENT | OPHTHALMIC | Status: DC | PRN
  Administered 2020-10-04: 1 via OPHTHALMIC

## 2020-10-04 MED ORDER — ATROPINE SULFATE 1 % OP SOLN
OPHTHALMIC | Status: AC
  Filled 2020-10-04: qty 5

## 2020-10-04 MED ORDER — BSS IO SOLN
INTRAOCULAR | Status: AC
  Filled 2020-10-04: qty 15

## 2020-10-04 MED ORDER — GATIFLOXACIN 0.5 % OP SOLN
OPHTHALMIC | Status: AC
  Filled 2020-10-04: qty 2.5

## 2020-10-04 MED ORDER — PROPOFOL 10 MG/ML IV BOLUS
INTRAVENOUS | Status: AC
  Filled 2020-10-04: qty 20

## 2020-10-04 MED ORDER — DEXTROSE 50 % IV SOLN
INTRAVENOUS | Status: AC
  Administered 2020-10-04: 12.5 g via INTRAVENOUS
  Filled 2020-10-04: qty 50

## 2020-10-04 MED ORDER — BUPIVACAINE HCL (PF) 0.75 % IJ SOLN
INTRAMUSCULAR | Status: AC
  Filled 2020-10-04: qty 10

## 2020-10-04 MED ORDER — CARBACHOL 0.01 % IO SOLN
INTRAOCULAR | Status: AC
  Filled 2020-10-04: qty 1.5

## 2020-10-04 MED ORDER — SODIUM CHLORIDE (PF) 0.9 % IJ SOLN
INTRAMUSCULAR | Status: AC
  Filled 2020-10-04: qty 10

## 2020-10-04 MED ORDER — EPINEPHRINE PF 1 MG/ML IJ SOLN
INTRAMUSCULAR | Status: AC
  Filled 2020-10-04: qty 1

## 2020-10-04 MED ORDER — ACETAMINOPHEN 500 MG PO TABS
1000.0000 mg | ORAL_TABLET | Freq: Once | ORAL | Status: AC
  Administered 2020-10-04: 1000 mg via ORAL
  Filled 2020-10-04: qty 2

## 2020-10-04 MED ORDER — ACETAZOLAMIDE SODIUM 500 MG IJ SOLR
INTRAMUSCULAR | Status: AC
  Filled 2020-10-04: qty 500

## 2020-10-04 SURGICAL SUPPLY — 71 items
APPLICATOR COTTON TIP 6 STRL (MISCELLANEOUS) ×8 IMPLANT
APPLICATOR COTTON TIP 6IN STRL (MISCELLANEOUS) ×12
BAND SCLERAL BUCKLING TYPE 41 (Ophthalmic Related) ×3 IMPLANT
BAND WRIST GAS GREEN (MISCELLANEOUS) ×2 IMPLANT
BETADINE 5% OPHTHALMIC (OPHTHALMIC) ×4 IMPLANT
BLADE EYE CATARACT 19 1.4 BEAV (BLADE) IMPLANT
BLADE MVR KNIFE 19G (BLADE) IMPLANT
BNDG EYE OVAL (GAUZE/BANDAGES/DRESSINGS) IMPLANT
CABLE BIPOLOR RESECTION CORD (MISCELLANEOUS) ×3 IMPLANT
CANNULA DUAL BORE 23G (CANNULA) IMPLANT
CANNULA FLEX TIP 25G (CANNULA) ×3 IMPLANT
CANNULA VLV SOFT TIP 25GA (OPHTHALMIC) ×3 IMPLANT
COVER SURGICAL LIGHT HANDLE (MISCELLANEOUS) ×3 IMPLANT
COVER WAND RF STERILE (DRAPES) ×3 IMPLANT
DRAPE MICROSCOPE LEICA 46X105 (MISCELLANEOUS) ×3 IMPLANT
ERASER HMR WETFIELD 23G BP (MISCELLANEOUS) IMPLANT
FILTER BLUE MILLIPORE (MISCELLANEOUS) IMPLANT
FILTER STRAW FLUID ASPIR (MISCELLANEOUS) IMPLANT
FORCEPS GRIESHABER ILM 25G A (INSTRUMENTS) IMPLANT
GAS AUTO FILL CONSTEL (OPHTHALMIC) ×3
GAS AUTO FILL CONSTELLATION (OPHTHALMIC) ×2 IMPLANT
GAS IO PFP 125GM ISPAN CNSTL (MISCELLANEOUS) ×2 IMPLANT
GAS TANK CONSTELL (MISCELLANEOUS) ×3
GAS WRIST BAND GREEN (MISCELLANEOUS) ×3
GLOVE BIO SURGEON STRL SZ7.5 (GLOVE) ×6 IMPLANT
GLOVE BIOGEL M 7.0 STRL (GLOVE) ×3 IMPLANT
GOWN STRL REUS W/ TWL LRG LVL3 (GOWN DISPOSABLE) ×4 IMPLANT
GOWN STRL REUS W/ TWL XL LVL3 (GOWN DISPOSABLE) ×2 IMPLANT
GOWN STRL REUS W/TWL LRG LVL3 (GOWN DISPOSABLE) ×6
GOWN STRL REUS W/TWL XL LVL3 (GOWN DISPOSABLE) ×3
KIT BASIN OR (CUSTOM PROCEDURE TRAY) ×3 IMPLANT
KIT PERFLUORON PROCEDURE 5ML (MISCELLANEOUS) IMPLANT
KNIFE CRESCENT 1.75 EDGEAHEAD (BLADE) IMPLANT
KNIFE GRIESHABER SHARP 2.5MM (MISCELLANEOUS) ×3 IMPLANT
LENS BIOM SUPER VIEW SET DISP (MISCELLANEOUS) IMPLANT
LENS VITRECTOMY FLAT OCLR DISP (MISCELLANEOUS) IMPLANT
MICROPICK 25G (MISCELLANEOUS)
NEEDLE 18GX1X1/2 (RX/OR ONLY) (NEEDLE) ×3 IMPLANT
NEEDLE 25GX 5/8IN NON SAFETY (NEEDLE) ×12 IMPLANT
NEEDLE HYPO 30X.5 LL (NEEDLE) ×6 IMPLANT
NS IRRIG 1000ML POUR BTL (IV SOLUTION) ×3 IMPLANT
OPHTHALMIC BETADINE 5% (OPHTHALMIC) ×6
PACK VITRECTOMY CUSTOM (CUSTOM PROCEDURE TRAY) ×3 IMPLANT
PAD ARMBOARD 7.5X6 YLW CONV (MISCELLANEOUS) ×6 IMPLANT
PAK PIK VITRECTOMY CVS 25GA (OPHTHALMIC) ×3 IMPLANT
PIC ILLUMINATED 25G (OPHTHALMIC)
PICK MICROPICK 25G (MISCELLANEOUS) IMPLANT
PIK ILLUMINATED 25G (OPHTHALMIC) IMPLANT
PROBE DIATHERMY DSP 27GA (MISCELLANEOUS) IMPLANT
PROBE ENDO DIATHERMY 25G (MISCELLANEOUS) ×3 IMPLANT
PROBE LASER ILLUM FLEX CVD 25G (OPHTHALMIC) ×3 IMPLANT
REPL STRA BRUSH NEEDLE (NEEDLE) IMPLANT
RESERVOIR BACK FLUSH (MISCELLANEOUS) IMPLANT
SCRAPER DIAMOND 25GA (OPHTHALMIC RELATED) IMPLANT
STRIP CLOSURE SKIN 1/2X4 (GAUZE/BANDAGES/DRESSINGS) ×3 IMPLANT
SUT ETHILON 5.0 S-24 (SUTURE) ×3 IMPLANT
SUT ETHILON 9 0 TG140 8 (SUTURE) IMPLANT
SUT SILK 2 0 (SUTURE) ×3
SUT SILK 2 0 TIES 17X18 (SUTURE) ×3
SUT SILK 2-0 18XBRD TIE 12 (SUTURE) ×2 IMPLANT
SUT SILK 2-0 18XBRD TIE BLK (SUTURE) ×2 IMPLANT
SUT VICRYL 7 0 TG140 8 (SUTURE) ×3 IMPLANT
SYR 10ML LL (SYRINGE) ×3 IMPLANT
SYR 20ML LL LF (SYRINGE) ×3 IMPLANT
SYR 5ML LL (SYRINGE) ×3 IMPLANT
SYR BULB EAR ULCER 3OZ GRN STR (SYRINGE) ×3 IMPLANT
SYR TB 1ML LUER SLIP (SYRINGE) ×3 IMPLANT
TOWEL GREEN STERILE FF (TOWEL DISPOSABLE) ×3 IMPLANT
TRAY FOLEY W/BAG SLVR 14FR (SET/KITS/TRAYS/PACK) ×3 IMPLANT
TUBING HIGH PRESS EXTEN 6IN (TUBING) IMPLANT
WATER STERILE IRR 1000ML POUR (IV SOLUTION) ×3 IMPLANT

## 2020-10-04 NOTE — Anesthesia Preprocedure Evaluation (Signed)
Anesthesia Evaluation    Reviewed: Allergy & Precautions, Patient's Chart, lab work & pertinent test results  History of Anesthesia Complications (+) PONV and history of anesthetic complications  Airway Mallampati: I  TM Distance: >3 FB Neck ROM: Full    Dental  (+) Dental Advisory Given, Missing, Partial Upper   Pulmonary former smoker,    Pulmonary exam normal breath sounds clear to auscultation       Cardiovascular negative cardio ROS Normal cardiovascular exam Rhythm:Regular Rate:Normal     Neuro/Psych PSYCHIATRIC DISORDERS Anxiety Depression    GI/Hepatic negative GI ROS, Neg liver ROS,   Endo/Other  negative endocrine ROS  Renal/GU negative Renal ROS     Musculoskeletal  (+) Arthritis ,   Abdominal (+) + obese,   Peds  Hematology  (+) anemia ,   Anesthesia Other Findings   Reproductive/Obstetrics                             Anesthesia Physical  Anesthesia Plan  ASA: II  Anesthesia Plan: General   Post-op Pain Management:    Induction: Intravenous  PONV Risk Score and Plan: 4 or greater and Ondansetron, Midazolam, Scopolamine patch - Pre-op, Treatment may vary due to age or medical condition and Dexamethasone  Airway Management Planned: Oral ETT  Additional Equipment: None  Intra-op Plan:   Post-operative Plan: Extubation in OR  Informed Consent: I have reviewed the patients History and Physical, chart, labs and discussed the procedure including the risks, benefits and alternatives for the proposed anesthesia with the patient or authorized representative who has indicated his/her understanding and acceptance.       Plan Discussed with: CRNA and Anesthesiologist  Anesthesia Plan Comments:         Anesthesia Quick Evaluation

## 2020-10-04 NOTE — Anesthesia Procedure Notes (Signed)
Performed by: Janace Litten, CRNA

## 2020-10-04 NOTE — Interval H&P Note (Signed)
History and Physical Interval Note:  10/04/2020 12:07 PM  Christina [name]  has presented today for surgery, with the diagnosis of left eye, retinal detachment.  The various methods of treatment have been discussed with the patient and family. After consideration of risks, benefits and other options for treatment, the patient has consented to  Procedure(s): VITRECTOMY 25 GAUGE WITH SCLERAL BUCKLE (Left) as a surgical intervention.  The patient's history has been reviewed, patient examined, no change in status, stable for surgery.  I have reviewed the patient's chart and labs.  Questions were answered to the patient's satisfaction.     Bernarda Caffey

## 2020-10-04 NOTE — Progress Notes (Deleted)
Christina [name] is an 55 y.o. female.    Chief Complaint: retinal detachment, left eye  HPI: Pt with history of retinal detachment OS, s/p pneumatic retinopexy on 10/01/20. On post op follow up exams, the retinal detachment has failed to improve. After a discussion of the risks, benefits and alternatives, the patient has elected to proceed with surgical repair of her retinal detachment OS--25g PPV w/ endolaser and gas, possible scleral buckle OS, under general anesthesia.  Past Medical History:  Diagnosis Date  . Anemia   . Arthritis    hands and knees  . 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 Cataracts, bilateral   . Depression   . GAD (generalized anxiety disorder)   . Gallbladder problem   . History of ovarian cyst   . IBS (irritable bowel syndrome)   . Joint pain   . PONV (postoperative nausea and vomiting)    does well with scop patch  . Pre-diabetes    followed by pcp; on metformin for weight loss -Dr Mellody Dance  . Right knee meniscal tear   . Urgency of urination     Past Surgical History:  Procedure Laterality Date  . ABDOMINAL HYSTERECTOMY  05/1996   endometriosis  . ACHILLES TENDON REPAIR Right 2010;  revision 2011  . BLADDER SUSPENSION  2000  . BREAST BIOPSY Left 10/2015  . BREAST IMPLANT REMOVAL Bilateral 08/12/2017   Procedure: REMOVAL BILATERAL BREAST IMPLANTS;  Surgeon: Wallace Going, DO;  Location: [place];  Service: Plastics;  Laterality: Bilateral;  . BREAST RECONSTRUCTION WITH PLACEMENT OF TISSUE EXPANDER AND FLEX HD (ACELLULAR HYDRATED DERMIS) Bilateral 01/09/2016  . BREAST RECONSTRUCTION WITH PLACEMENT OF TISSUE EXPANDER AND FLEX HD (ACELLULAR HYDRATED DERMIS) Bilateral 01/09/2016   Procedure: BREAST RECONSTRUCTION WITH PLACEMENT OF TISSUE EXPANDER AND FLEX HD (ACELLULAR HYDRATED  DERMIS);  Surgeon: Loel Lofty Dillingham, DO;  Location: [place];  Service: Plastics;  Laterality: Bilateral;  . BREAST RECONSTRUCTION WITH PLACEMENT OF TISSUE EXPANDER AND FLEX HD (ACELLULAR HYDRATED DERMIS) Bilateral 05/29/2016   Procedure: PLACEMENT OF BILATERAL TISSUE EXPANDER AND FLEX HD (ACELLULAR HYDRATED DERMIS);  Surgeon: Wallace Going, DO;  Location: [place];  Service: Plastics;  Laterality: Bilateral;  . BREAST REDUCTION SURGERY Bilateral 11/26/2016   Procedure: BILATERAL BREAST CAPSULE CONTRACTURE RELASE;  Surgeon: Wallace Going, DO;  Location: [place];  Service: Plastics;  Laterality: Bilateral;  . [place]; 1989; 1991  . ELBOW SURGERY Right x3   last one 02-01-2019 @ [facility]  . FAT GRAFTING BILATERAL BREAST  08-09-2018  @[facility]   . INCISION AND DRAINAGE OF WOUND Bilateral 02/11/2016   Procedure: IRRIGATION AND DEBRIDEMENT OF BILATERAL BREAST POCKET;  Surgeon: Wallace Going, DO;  Location: [place];  Service: Plastics;  Laterality: Bilateral;  . KNEE ARTHROSCOPY WITH MEDIAL MENISECTOMY Right 12/20/2019   Procedure: KNEE ARTHROSCOPY WITH MEDIAL MENISECTOMY;  Surgeon: Marchia Bond, MD;  Location: [place];  Service: Orthopedics;  Laterality: Right;  . LAPAROSCOPIC APPENDECTOMY  04-07-2011   @[facility]    w/ Excision peritoneal lipoma and lysis adhesions  . LAPAROSCOPIC CHOLECYSTECTOMY  ~ 1999  . LIPOSUCTION WITH LIPOFILLING Bilateral 11/26/2016   Procedure: LIPOFILLING FOR SYMMETRY;  Surgeon: Wallace Going, DO;  Location: [place];  Service: Plastics;  Laterality: Bilateral;  .  LIPOSUCTION WITH LIPOFILLING Bilateral 01/21/2017   Procedure: BILATERAL BREAST  LIPOFILLING FOR ASYMMETRY;  Surgeon: Wallace Going, DO;  Location: [place];  Service: Plastics;  Laterality: Bilateral;  . LIPOSUCTION WITH LIPOFILLING Bilateral 06/28/2020   Procedure: Lipofilling  bilateral breasts for asymmetry;  Surgeon: Wallace Going, DO;  Location: [place];  Service: Plastics;  Laterality: Bilateral;  90 min  . MASTECTOMY Bilateral 01/09/2016  . NIPPLE SPARING MASTECTOMY/SENTINAL LYMPH NODE BIOPSY/RECONSTRUCTION/PLACEMENT OF TISSUE EXPANDER Bilateral 01/09/2016   Procedure: BILATERAL NIPPLE SPARING MASTECTOMY WITH LEFT SENTINAL LYMPH NODE BIOPSY ;  Surgeon: Stark Klein, MD;  Location: [place];  Service: General;  Laterality: Bilateral;  . REMOVAL OF BILATERAL TISSUE EXPANDERS WITH PLACEMENT OF BILATERAL BREAST IMPLANTS Bilateral 08/20/2016   Procedure: REMOVAL OF BILATERAL TISSUE EXPANDERS WITH PLACEMENT OF BILATERAL SILICONE IMPLANTS;  Surgeon: Wallace Going, DO;  Location: [place];  Service: Plastics;  Laterality: Bilateral;  . REMOVAL OF BILATERAL TISSUE EXPANDERS WITH PLACEMENT OF BILATERAL BREAST IMPLANTS Bilateral 11/05/2017   Procedure: REMOVAL OF BILATERAL TISSUE EXPANDERS WITH PLACEMENT OF BILATERAL BREAST SILICONE IMPLANTS;  Surgeon: Wallace Going, DO;  Location: [place];  Service: Plastics;  Laterality: Bilateral;  . REMOVAL OF TISSUE EXPANDER Bilateral 02/11/2016   Procedure: REMOVAL OF BILATERAL TISSUE EXPANDERS AND FLEX HD REMOVAL;  Surgeon: Wallace Going, DO;  Location: [place];  Service: Plastics;  Laterality: Bilateral;  . TISSUE EXPANDER PLACEMENT Bilateral 08/12/2017   Procedure: PLACEMENT OF BILATERAL TISSUE EXPANDER;  Surgeon: Wallace Going, DO;  Location: [place];  Service: Plastics;  Laterality: Bilateral;  . TUBAL LIGATION Bilateral 1991    Family History  Problem Relation Age of Onset  . Heart failure Father   . Prostate cancer Father 22  . Retinal detachment Father   . Colon polyps Mother        approx 2  . Other Mother        hx HPV and hysterectomy due to precancerous cells  . Depression Mother   . Anxiety disorder Mother    . Other Sister 65       hx of hysterectomy for unspecified reason; still has ovaries  . Other Sister 15       paternal half-sister hx of hysterectomy for unspecified reason; still has ovaries  . Bladder Cancer Maternal Uncle 79       not a smoker  . Kidney failure Maternal Grandmother   . Congestive Heart Failure Maternal Grandmother   . Colon cancer Maternal Grandmother 46  . Diabetes Maternal Grandmother   . Lung cancer Maternal Grandfather 34       smoker  . Breast cancer Paternal Grandmother        dx. early 10s; w/ hx of trauma to breast  . Crohn's disease Daughter   . Lung cancer Maternal Uncle 30       smoker   Social History:  reports that she quit smoking about 12 years ago. Her smoking use included cigarettes. She has a 10.00 pack-year smoking history. She has never used smokeless tobacco. She reports that she does not drink alcohol and does not use drugs.  Allergies: No Known Allergies  (Not in a hospital admission)   Review of systems otherwise negative  There were no vitals taken for this visit.  Physical exam: Mental status: oriented x3. Eyes: See eye exam associated with this date of surgery Ears, Nose, Throat: within normal limits Neck:  Within Normal limits General: within normal limits Chest: Within normal limits Breast: deferred Heart: Within normal limits Abdomen: Within normal limits GU: deferred Extremities: within normal limits Skin: within normal limits  Assessment/Plan 1. Retinal detachment, left eye  Plan: To [facility] for 25g PPV w/ endolaser and gas, OS, possible scleral buckle, under general anesthesia - case scheduled for 1.27.22, 1pm, [facility] OR 08  Gardiner Sleeper, M.D., Ph.D. Vitreoretinal Surgeon [facility]

## 2020-10-04 NOTE — Brief Op Note (Signed)
10/04/2020  4:38 PM  PATIENT:  Christina [name]  55 y.o. female  PRE-OPERATIVE DIAGNOSIS:  left eye, retinal detachment  POST-OPERATIVE DIAGNOSIS:  left eye, retinal detachment  PROCEDURE:  Procedure(s): 25 GAUGE PARS PLANA VITRECTOMY LEFT EYE  (Left) SCLERAL BUCKLE LEFT EYE (Left) LASER RETINOPEXY WITH INDIRECT LASER OPTHALMOSCOPE, RIGHT EYE (Right) GAS/FLUID EXCHANGE (Left) INSERTION OF GAS (Left) PHOTOCOAGULATION WITH LASER (Left)  SURGEON:  Surgeon(s) and Role:    Bernarda Caffey, MD - Primary  ASSISTANTS: Ernest Mallick, Ophthalmic Assistant   ANESTHESIA:   local and general  EBL:  minimal   BLOOD ADMINISTERED:none  DRAINS: none   LOCAL MEDICATIONS USED:  BUPIVICAINE , LIDOCAINE  and Amount: 9 ml  SPECIMEN:  No Specimen  DISPOSITION OF SPECIMEN:  N/A  COUNTS:  YES  TOURNIQUET:  * No tourniquets in log *  DICTATION: .Note written in EPIC  PLAN OF CARE: Discharge to home after PACU  PATIENT DISPOSITION:  PACU - hemodynamically stable.   Delay start of Pharmacological VTE agent (>24hrs) due to surgical blood loss or risk of bleeding: not applicable

## 2020-10-04 NOTE — Op Note (Signed)
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 Laser retinopexy via indirect ophthalmoscopy, RIGHT EYE 2. Scleral buckle procedure, Left Eye 3. 25 gauge pars plana vitrectomy, Left Eye 4. Perfluoron injection, Left Eye 5. Endolaser, Left Eye 6. 14% C3F8 Gas Injection, Left Eye     Indications for procedure: This is a 55 yo F with a history of retinal detachment OS s/p pneumatic cryopexy on 1.24.24. Due to worsening subretinal fluid and significant vitreous debris, we recommended scleral buckle procedure + 25g PPV w/ endolaser and gas for repair of the persistent retinal detachment. On examination of the right eye, the retina found to have inferior pigmented lattice degeneration with retinal holes. After discussing the risks, benefits, and alternatives to surgery, the patient electively decided to proceed with surgery of the left eye and laser retinopexy of the right eye, and informed consent was obtained. The surgery was an attempt to barricade the peripheral lattice degeneration in the in the right eye and reattach the retina in the left eye to improve the vision within the reasonable expectations of the surgeon.    Procedure in Detail:    The patient was met in the pre-operative holding area where their identification data was verified.  It was noted that there was a signed, informed consent in the chart and both eyes were verbally verified by the patient--right eye for laser and left eye for surgery. Then the operative left eye and was marked with the surgeon's initials with a marking pen and the right eye was marked with word "laser." The patient was then taken to the operating room and placed in the supine position. General  endotracheal anesthesia was induced.   Attention was first turned to the indirect laser ophthalmoscopy portion of the procedure. Barricade laser retinopexy was performed to a focal patch of pigmented lattice in the peripheral retina of the RIGHT EYE at 0700 oclock. Using the laser indirect ophthalmoscope, 244 spots were placed at 300 mW power, 200 ms duration. Following completion of laser retinopexy, lubricating ointment was applied to the right eye and the eyelid was taped closed.   The LEFT EYE was then prepped with 5% betadine and draped in the normal fashion for ophthalmic surgery and a secondary time-out was performed to identify the correct patient, eyes, procedures, and any allergies.    Attention was then turned to the scleral buckle portion of the operation.A 360 conjunctival peritomy was created using Westcott scissors and 0.12 forceps. Each of the four quadrants between the rectus muscles was dissected using Stevens scissors to detach Tenon's attachments from the globe. Each of the four rectus muscles was isolated on a muscle hook and slung using 2-0 Silk suture in the usual standard fashion. Each of the four quadrants between the rectus muscles was inspected and there were noted to be no areas of scleral thinning. A #77 silicone band was then brought onto the field and was threaded under each rectus muscle. The band was thenlooselysecured using a #70 Watzke sleeve in the inferotemporalquadrant. The band was then sutured to the sclera in each quadrant using 5-0 nylon sutures passed partial thickness through the sclera in a horizontal mattress fashion. The scleral buckle was thentightened to the appropriate height with two locking needle  drivers. Attention was then turned to the vitrectomy portion of the procedure.  A 25gauge trocar was placed in the inferotemporal quadrant in a beveled fashion. A 4 mm infusion cannula was placed through this trocar, and the infusion cannula was confirmed  in the vitreous cavity with no incarceration of retina or choroid prior to turning it on. Two additional 25gauge trocars were placed in the superonasal and superotemporal quadrants(2 and 10 oclock, respectively)in a similar beveled fashion, 38m posterior to the limbus. Viscoat was placed on the cornea. The BIOM was used to visualize the posterior segment. The patient had a residual C3F8 bubble from pneumatic cryopexy in the vitreous cavity as well as some significant vitreous debris. The previously detached superior retina was reattached, but there was residual subretinal fluid in the inferior and temporal periphery.  There was a large irregular tear at 11 oclock with some surrounding early cryopexy changes. Inferiorly there were patches of pigmented lattice within detached retina. A core vitrectomy was performed using the BIOM visualization system, vitrectomy probe and light pipe. The residual C3F8 bubble was removed at this time. Kenalog was used to mark the vitreous and assist in dissecting vitreous membranes. A posterior vitreous detachment was confirmed.  Traction was removed from all retinal breaks. The breaks weretrimmed using the cutter to smooth the edges.Perfluoron was injected to push the subretinal fluid anterior to the scleral buckle and to cover the large retinal break. Under perfluoron, endolaser was applied to the posterior portion of the break and 360 over and just posterior to the scleral buckle. A complete fluid-air exchange was performed with a soft tip extrusion cannula over the 11 oclock break, then posteriorly to remove the perfluoron. After completion of these maneuvers, the retina was flat over the macula and over the scleral buckle. Under air, endolaser was applied to the anterior portion of the break to complete the barricade laser.  At this time, the buckle height was confirmed and the buckle was finalized by trimming the band ends.The superonasal trocar wasremoved and sutured  with 7-0 vicryl in an interrupted fashion.A complete air to14%C3F8gas exchange was performed through the infusion cannula and vented through the superotemporal trocar using the extrusion cannula.Thesuperotemporal trocar andinfusion cannula and associated trocar werethen removed and sutured with 7-0 vicryl in an interrupted fashion. Kefzol+ polymixinirrigation wasthenused over the buckle. A subtenon's block containing 0.75% marcaine and 2% lidocaine was administered.  The conjunctiva was closed with 7-0 vicryl sutures. The eye's intraocular pressurewas confirmed to be at a physiologic level by digital palpation. Subconjunctival injections of Antibiotic and kenalogwere administered. The lid speculum and drapes were removed. Drops of an antibiotic, antihypertensives, and steroid were given. Copious antibiotic ointment was instilled into the eye. The eye was patched and shielded. The patient tolerated the procedure well without any intraoperative or immediate postoperative complications. The patient was taken to the recovery room in good condition. The patient was instructed to maintain a strict face-down position andwill be seen by Dr. ZBaltazar Najjarin clinic.

## 2020-10-04 NOTE — Discharge Instructions (Addendum)
POSTOPERATIVE INSTRUCTIONS  Your doctor has performed vitreoretinal surgery on you at [place].  - Keep eye patched and shielded until seen by Dr. Zamora 8 AM tomorrow in clinic - Do not use drops until return - FACE DOWN POSITIONING WHILE AWAKE - Sleep with belly down or on right side, avoid laying flat on back.    - No strenuous bending, stooping or lifting.  - You may not drive until further notice.  - If your doctor used a gas bubble in your eye during the procedure he will advise you on postoperative positioning. If you have a gas bubble you will be wearing a green bracelet that was applied in the operating room. The green bracelet should stay on as long as the gas bubble is in your eye. While the gas bubble is present you should not fly in an airplane. If you require general anesthesia while the gas bubble is present you must notify your anesthesiologist that an intraocular gas bubble is present so he can take the appropriate precautions.  - Tylenol or any other over-the-counter pain reliever can be used according to your doctor. If more pain medicine is required, your doctor will have a prescription for you.  - You may read, go up and down stairs, and watch television.     Brian Zamora, M.D., Ph.D.  

## 2020-10-04 NOTE — Transfer of Care (Signed)
Immediate Anesthesia Transfer of Care Note  Patient: Christina [name]  Procedure(s) Performed: 25 GAUGE PARS PLANA VITRECTOMY LEFT EYE  (Left Eye) SCLERAL BUCKLE LEFT EYE (Left Eye) LASER RETINOPEXY WITH INDIRECT LASER OPTHALMOSCOPE, RIGHT EYE (Right Eye) GAS/FLUID EXCHANGE (Left Eye) INSERTION OF GAS (Left Eye) PHOTOCOAGULATION WITH LASER (Left Eye)  Patient Location: PACU  Anesthesia Type:General  Level of Consciousness: drowsy, patient cooperative and responds to stimulation  Airway & Oxygen Therapy: Patient Spontanous Breathing  Post-op Assessment: Report given to RN and Post -op Vital signs reviewed and stable  Post vital signs: Reviewed and stable  Last Vitals:  Vitals Value Taken Time  BP 139/71 10/04/20 1639  Temp    Pulse 80 10/04/20 1640  Resp 21 10/04/20 1640  SpO2 94 % 10/04/20 1640  Vitals shown include unvalidated device data.  Last Pain:  Vitals:   10/04/20 0942  TempSrc: Oral         Complications: No complications documented.

## 2020-10-04 NOTE — Anesthesia Postprocedure Evaluation (Signed)
Anesthesia Post Note  Patient: Charley Lafrance Lisanti  Procedure(s) Performed: 25 GAUGE PARS PLANA VITRECTOMY LEFT EYE  (Left Eye) SCLERAL BUCKLE LEFT EYE (Left Eye) LASER RETINOPEXY WITH INDIRECT LASER OPTHALMOSCOPE, RIGHT EYE (Right Eye) GAS/FLUID EXCHANGE (Left Eye) INSERTION OF GAS (Left Eye) PHOTOCOAGULATION WITH LASER (Left Eye)     Patient location during evaluation: PACU Anesthesia Type: General Level of consciousness: awake and alert Pain management: pain level controlled Vital Signs Assessment: post-procedure vital signs reviewed and stable Respiratory status: spontaneous breathing, nonlabored ventilation, respiratory function stable and patient connected to nasal cannula oxygen Cardiovascular status: blood pressure returned to baseline and stable Postop Assessment: no apparent nausea or vomiting Anesthetic complications: no   No complications documented.  Last Vitals:  Vitals:   10/04/20 1654 10/04/20 1709  BP: 120/69 120/71  Pulse: 78 78  Resp: 20 (!) 21  Temp:  37 C  SpO2: 91% 92%    Last Pain:  Vitals:   10/04/20 1709  TempSrc:   PainSc: 0-No pain                 Malaia Buchta,W. EDMOND

## 2020-10-04 NOTE — Progress Notes (Signed)
Hypoglycemic Event  CBG: 62  Treatment: D50 25 mL (12.5 gm)  Symptoms: None  Follow-up CBG: Time:1230 CBG Result: 108  Possible Reasons for Event: Inadequate meal intake  Comments/MD notified: Dr. Tobias Alexander notified    Caryl Comes Mahmood Boehringer

## 2020-10-04 NOTE — Progress Notes (Signed)
[facility] Note  10/04/2020     CHIEF COMPLAINT Patient presents for Post-op Follow-up   HISTORY OF PRESENT ILLNESS: Christina [name] is a 55 y.o. female who presents to the clinic today for:   HPI    Post-op Follow-up    In left eye.  Vision is worse.          Comments    Pt states she thinks vision is a little worse OS than it was yesterday.  Pt states she may have seen flashes of light OS yesterday but is unsure.  Pt denies eye pain or discomfort.       Last edited by Doneen Poisson on 10/04/2020  8:05 AM. (History)    pt states   Referring physician: Maurice Small, MD [address],  [place] [address]  HISTORICAL INFORMATION:   Selected notes from the MEDICAL RECORD NUMBER Referred by Dr. Quentin Ore for RD OS LEE:  Ocular Hx- PMH-    CURRENT MEDICATIONS: Current Outpatient Medications (Ophthalmic Drugs)  Medication Sig  . neomycin-polymyxin b-dexamethasone (MAXITROL) 3.5-10000-0.1 OINT Place 1 application into the left eye 4 (four) times daily.   No current facility-administered medications for this visit. (Ophthalmic Drugs)   Current Outpatient Medications (Other)  Medication Sig  . ALPRAZolam (XANAX) 0.25 MG tablet Take 0.25 mg by mouth daily as needed for anxiety.  Marland Kitchen amitriptyline (ELAVIL) 100 MG tablet Take 0.5 tablets (50 mg total) by mouth at bedtime.  Marland Kitchen FLUoxetine (PROZAC) 40 MG capsule Take 40 mg by mouth daily.  Marland Kitchen HYDROcodone-acetaminophen (NORCO/VICODIN) 5-325 MG tablet Take 1 tablet by mouth every 4 (four) hours as needed for moderate pain.  . metFORMIN (GLUCOPHAGE) 500 MG tablet Take 1 tablet (500 mg total) by mouth 2 (two) times daily with a meal.  . vitamin B-12 (CYANOCOBALAMIN) 500 MCG tablet Take 1 tablet (500 mcg total) by mouth daily.  . Vitamin D, Ergocalciferol, (DRISDOL) 1.25 MG (50000 UNIT) CAPS capsule Take 1 capsule (50,000 Units total) by mouth every 7 (seven) days.   No current  facility-administered medications for this visit. (Other)      REVIEW OF SYSTEMS: ROS    Positive for: Endocrine, Eyes   Negative for: Constitutional, Gastrointestinal, Neurological, Skin, Genitourinary, Musculoskeletal, HENT, Cardiovascular, Respiratory, Psychiatric, Allergic/Imm, Heme/Lymph   Last edited by Doneen Poisson on 10/04/2020  8:00 AM. (History)       ALLERGIES No Known Allergies  PAST MEDICAL HISTORY Past Medical History:  Diagnosis Date  . Anemia   . Arthritis    hands and knees  . 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 Cataracts, bilateral   . Depression   . GAD (generalized anxiety disorder)   . Gallbladder problem   . History of ovarian cyst   . IBS (irritable bowel syndrome)   . Joint pain   . PONV (postoperative nausea and vomiting)    does well with scop patch  . Pre-diabetes    followed by pcp; on metformin for weight loss -Dr Mellody Dance  . Retinal detachment    Rheg OS  . Right knee meniscal tear   . Urgency of urination    Past Surgical History:  Procedure Laterality Date  . ABDOMINAL HYSTERECTOMY  05/1996   endometriosis  . ACHILLES TENDON REPAIR Right  2010;  revision 2011  . BLADDER SUSPENSION  2000  . BREAST BIOPSY Left 10/2015  . BREAST IMPLANT REMOVAL Bilateral 08/12/2017   Procedure: REMOVAL BILATERAL BREAST IMPLANTS;  Surgeon: Wallace Going, DO;  Location: [facility];  Service: Plastics;  Laterality: Bilateral;  . BREAST RECONSTRUCTION WITH PLACEMENT OF TISSUE EXPANDER AND FLEX HD (ACELLULAR HYDRATED DERMIS) Bilateral 01/09/2016  . BREAST RECONSTRUCTION WITH PLACEMENT OF TISSUE EXPANDER AND FLEX HD (ACELLULAR HYDRATED DERMIS) Bilateral 01/09/2016   Procedure: BREAST RECONSTRUCTION WITH PLACEMENT OF TISSUE EXPANDER AND FLEX HD (ACELLULAR HYDRATED DERMIS);   Surgeon: Loel Lofty Dillingham, DO;  Location: [facility];  Service: Plastics;  Laterality: Bilateral;  . BREAST RECONSTRUCTION WITH PLACEMENT OF TISSUE EXPANDER AND FLEX HD (ACELLULAR HYDRATED DERMIS) Bilateral 05/29/2016   Procedure: PLACEMENT OF BILATERAL TISSUE EXPANDER AND FLEX HD (ACELLULAR HYDRATED DERMIS);  Surgeon: Wallace Going, DO;  Location: [facility];  Service: Plastics;  Laterality: Bilateral;  . BREAST REDUCTION SURGERY Bilateral 11/26/2016   Procedure: BILATERAL BREAST CAPSULE CONTRACTURE RELASE;  Surgeon: Wallace Going, DO;  Location: [facility];  Service: Plastics;  Laterality: Bilateral;  . [place]; 1989; 1991  . ELBOW SURGERY Right x3   last one 02-01-2019 @ [facility]  . EYE SURGERY Left 10/01/2020   Pneumatic retinopexy for repair of rheg RD - Dr. Bernarda Caffey  . EYE SURGERY Left 10/04/2020   PPV - Dr. Bernarda Caffey  . FAT GRAFTING BILATERAL BREAST  08-09-2018  _0   . GAS INSERTION Left 10/04/2020   Procedure: INSERTION OF GAS;  Surgeon: Bernarda Caffey, MD;  Location: [facility];  Service: Ophthalmology;  Laterality: Left;  Marland Kitchen GAS/FLUID EXCHANGE Left 10/04/2020   Procedure: GAS/FLUID EXCHANGE;  Surgeon: Bernarda Caffey, MD;  Location: [facility];  Service: Ophthalmology;  Laterality: Left;  . INCISION AND DRAINAGE OF WOUND Bilateral 02/11/2016   Procedure: IRRIGATION AND DEBRIDEMENT OF BILATERAL BREAST POCKET;  Surgeon: Wallace Going, DO;  Location: [facility];  Service: Plastics;  Laterality: Bilateral;  . KNEE ARTHROSCOPY WITH MEDIAL MENISECTOMY Right 12/20/2019   Procedure: KNEE ARTHROSCOPY WITH MEDIAL MENISECTOMY;  Surgeon: Marchia Bond, MD;  Location: [facility];  Service: Orthopedics;  Laterality: Right;  . LAPAROSCOPIC APPENDECTOMY  04-07-2011   _1    w/ Excision peritoneal lipoma and lysis adhesions  . LAPAROSCOPIC CHOLECYSTECTOMY  ~ 1999  . LASER PHOTO ABLATION Right 10/04/2020   Procedure: LASER  RETINOPEXY WITH INDIRECT LASER OPTHALMOSCOPE, RIGHT EYE;  Surgeon: Bernarda Caffey, MD;  Location: [facility];  Service: Ophthalmology;  Laterality: Right;  . LIPOSUCTION WITH LIPOFILLING Bilateral 11/26/2016   Procedure: LIPOFILLING FOR SYMMETRY;  Surgeon: Wallace Going, DO;  Location: [facility];  Service: Plastics;  Laterality: Bilateral;  . LIPOSUCTION WITH LIPOFILLING Bilateral 01/21/2017   Procedure: BILATERAL BREAST  LIPOFILLING FOR ASYMMETRY;  Surgeon: Wallace Going, DO;  Location: [facility];  Service: Plastics;  Laterality: Bilateral;  . LIPOSUCTION WITH LIPOFILLING Bilateral 06/28/2020   Procedure: Lipofilling bilateral breasts for asymmetry;  Surgeon: Wallace Going, DO;  Location: [facility];  Service: Plastics;  Laterality: Bilateral;  90 min  . MASTECTOMY Bilateral 01/09/2016  . NIPPLE SPARING MASTECTOMY/SENTINAL LYMPH NODE BIOPSY/RECONSTRUCTION/PLACEMENT OF TISSUE EXPANDER Bilateral 01/09/2016   Procedure: BILATERAL NIPPLE SPARING MASTECTOMY WITH LEFT SENTINAL LYMPH NODE BIOPSY ;  Surgeon: Stark Klein, MD;  Location: [facility];  Service: General;  Laterality: Bilateral;  . PHOTOCOAGULATION WITH LASER Left 10/04/2020  Procedure: PHOTOCOAGULATION WITH LASER;  Surgeon: Bernarda Caffey, MD;  Location: [facility];  Service: Ophthalmology;  Laterality: Left;  . REMOVAL OF BILATERAL TISSUE EXPANDERS WITH PLACEMENT OF BILATERAL BREAST IMPLANTS Bilateral 08/20/2016   Procedure: REMOVAL OF BILATERAL TISSUE EXPANDERS WITH PLACEMENT OF BILATERAL SILICONE IMPLANTS;  Surgeon: Wallace Going, DO;  Location: [facility];  Service: Plastics;  Laterality: Bilateral;  . REMOVAL OF BILATERAL TISSUE EXPANDERS WITH PLACEMENT OF BILATERAL BREAST IMPLANTS Bilateral 11/05/2017   Procedure: REMOVAL OF BILATERAL TISSUE EXPANDERS WITH PLACEMENT OF BILATERAL BREAST SILICONE IMPLANTS;  Surgeon: Wallace Going, DO;  Location: [facility];   Service: Plastics;  Laterality: Bilateral;  . REMOVAL OF TISSUE EXPANDER Bilateral 02/11/2016   Procedure: REMOVAL OF BILATERAL TISSUE EXPANDERS AND FLEX HD REMOVAL;  Surgeon: Wallace Going, DO;  Location: [facility];  Service: Plastics;  Laterality: Bilateral;  . RETINAL DETACHMENT SURGERY Left 10/01/2020   Pneumatic retinopexy for repair of rheg RD - Dr. Bernarda Caffey  . RETINAL DETACHMENT SURGERY Left 10/04/2020   PPV - Dr. Bernarda Caffey  . SCLERAL BUCKLE Left 10/04/2020   Procedure: SCLERAL BUCKLE LEFT EYE;  Surgeon: Bernarda Caffey, MD;  Location: [facility];  Service: Ophthalmology;  Laterality: Left;  . TISSUE EXPANDER PLACEMENT Bilateral 08/12/2017   Procedure: PLACEMENT OF BILATERAL TISSUE EXPANDER;  Surgeon: Wallace Going, DO;  Location: [facility];  Service: Plastics;  Laterality: Bilateral;  . TUBAL LIGATION Bilateral 1991  . VITRECTOMY 25 GAUGE WITH SCLERAL BUCKLE Left 10/04/2020   Procedure: 25 GAUGE PARS PLANA VITRECTOMY LEFT EYE ;  Surgeon: Bernarda Caffey, MD;  Location: [facility];  Service: Ophthalmology;  Laterality: Left;    FAMILY HISTORY Family History  Problem Relation Age of Onset  . Heart failure Father   . Prostate cancer Father 36  . Retinal detachment Father   . Colon polyps Mother        approx 2  . Other Mother        hx HPV and hysterectomy due to precancerous cells  . Depression Mother   . Anxiety disorder Mother   . Other Sister 65       hx of hysterectomy for unspecified reason; still has ovaries  . Other Sister 83       paternal half-sister hx of hysterectomy for unspecified reason; still has ovaries  . Bladder Cancer Maternal Uncle 79       not a smoker  . Kidney failure Maternal Grandmother   . Congestive Heart Failure Maternal Grandmother   . Colon cancer Maternal Grandmother 86  . Diabetes Maternal Grandmother   . Lung cancer Maternal Grandfather 66       smoker  . Breast cancer Paternal Grandmother        dx.  early 33s; w/ hx of trauma to breast  . Crohn's disease Daughter   . Lung cancer Maternal Uncle 62       smoker    SOCIAL HISTORY Social History   Tobacco Use  . Smoking status: Former Smoker    Packs/day: 1.00    Years: 10.00    Pack years: 10.00    Types: Cigarettes    Quit date: 06/08/2008    Years since quitting: 12.3  . Smokeless tobacco: Never Used  Vaping Use  . Vaping Use: Never used  Substance Use Topics  . Alcohol use: No  . Drug use: Never         OPHTHALMIC EXAM:  Base  Eye Exam    Visual Acuity (Snellen - Linear)      Right Left   Dist cc 20/20 20/250 -1   Dist ph cc  20/200 -1   Correction: Glasses       Tonometry (Tonopen, 8:04 AM)      Right Left   Pressure 14 14       Pupils      Dark Light Shape React APD   Right 4 3 Round Brisk 0   Left 4 3 Round Brisk 2+       Visual Fields      Left Right     Full   Restrictions Partial outer superior temporal, inferior temporal deficiencies        Extraocular Movement      Right Left    Full Full       Neuro/Psych    Oriented x3: Yes   Mood/Affect: Normal       Dilation    Left eye: 1.0% Mydriacyl, 2.5% Phenylephrine @ 8:04 AM        Slit Lamp and Fundus Exam    Slit Lamp Exam      Right Left   Lids/Lashes Dermatochalasis - upper lid Dermatochalasis - upper lid   Conjunctiva/Sclera White and quiet Pneumatic Chemosis temporally - improved, Subconjunctival hemorrhage superiorly   Cornea 2+ Punctate epithelial erosions, mild tear film debris 1-2+ Punctate epithelial erosions, mild tear film debris   Anterior Chamber Deep and quiet moderate depth, narrow temporal angle   Iris Round and dilated Round and dilated, mild anterior bowing   Lens 1-2+ Nuclear sclerosis, 1-2+ Cortical cataract 1-2+ Nuclear sclerosis, 1-2+ Cortical cataract   Vitreous Vitreous syneresis, Posterior vitreous detachment, Weiss ring Vitreous syneresis, +pigment, Posterior vitreous detachment, vitreous condensations,  +debris, 25-30%        Fundus Exam      Right Left   Disc  Pink and Sharp, mild tilt, temporal PPA   C/D Ratio 0.3 0.4   Macula  Flat, Blunted foveal reflex, RPE mottling, shallow SRF almost to ST arcades   Vessels  mild attenuation   Periphery  Bullous inferior detachment from 0300-0800, Good early cryo changes superiorly, SRF extending to 0700; ORIGINALLY: [place] RD from 0100-230, large HST at 0130; mild pavingstone degen inferiorly        Refraction    Wearing Rx      Sphere Cylinder Axis   Right -4.00 +0.75 100   Left -4.50 +0.75 100          IMAGING AND PROCEDURES  Imaging and Procedures for 10/04/2020  Anesthesia Airway          Performed by: Janace Litten, CRNA               OCT, Retina - OU - Both Eyes       Right Eye Quality was good. Central Foveal Thickness: 253. Progression has been stable. Findings include abnormal foveal contour, no IRF, no SRF (irregular foveal contour ).   Left Eye Quality was borderline. Central Foveal Thickness: 695. Progression has worsened. Findings include subretinal fluid, intraretinal fluid, abnormal foveal contour (Superior SRF improved, interval increase in inferior SRF, now involving fovea).   Notes *Images captured and stored on drive  Diagnosis / Impression:  OD: abnormal foveal countour no IRF/SRF OS: Superior SRF improved, interval increase in inferior SRF, now involving fovea  Clinical management:  See below  Abbreviations: NFP - Normal foveal profile. CME - cystoid macular edema. PED -  pigment epithelial detachment. IRF - intraretinal fluid. SRF - subretinal fluid. EZ - ellipsoid zone. ERM - epiretinal membrane. ORA - outer retinal atrophy. ORT - outer retinal tubulation. SRHM - subretinal hyper-reflective material. IRHM - intraretinal hyper-reflective material                 ASSESSMENT/PLAN:    ICD-10-CM   1. Left retinal detachment  H33.22   2. Retinal edema  H35.81 OCT, Retina - OU -  Both Eyes  3. Retinal hole of right eye  H33.321   4. Diabetes mellitus type 2 without retinopathy ([place])  E11.9   5. Combined forms of age-related cataract of both eyes  H25.813     1,2. 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 - detached from 1 to 230 oclock w/ SRF still outside of ST arcades, large horseshoe tear at 130 - s/p pneumatic retinopexy OS (01.24.22) - today (POD3) - good gas bubble in place, improved positioning, interval improvement in superior SRF, but interval increase in inferior SRF -- shifted around to 0700 (inf nasal quad).  - recommend PPV OS today, 01.27.22 - case scheduled for today, 01.27.22 at 1:00PM [facility] OR 8  - f/u tomorrow POV1  3. Retinal hole, OD - small operculated hole at 0430 - The incidence, risk factors, and natural history of retinal tear was discussed with patient.   - Potential treatment options including laser retinopexy and cryotherapy discussed with patient. - recommend laser retinopexy OD today in OR - monitor  4. Diabetes mellitus, type 2 without retinopathy - The incidence, risk factors for progression, natural history and treatment options for diabetic retinopathy  were discussed with patient.   - The need for close monitoring of blood glucose, blood pressure, and serum lipids, avoiding cigarette or any type of tobacco, and the need for long term follow up was also discussed with patient. - monitor  5. Mixed Cataract OU - The symptoms of cataract, surgical options, and treatments and risks were discussed with patient. - discussed diagnosis and progression - not yet visually significant - monitor for now   Ophthalmic Meds Ordered this visit:  No orders of the defined types were placed in this encounter.     Return in about 1 day (around 10/05/2020) for f/u RD OS, DFE.  There are no Patient Instructions on file for this visit.   Explained the diagnoses, plan, and follow up with the patient and  they expressed understanding.  Patient expressed understanding of the importance of proper follow up care.   This document serves as a record of services personally performed by Gardiner Sleeper, MD, PhD. It was created on their behalf by San Jetty. Owens Shark, OA an ophthalmic technician. The creation of this record is the provider's dictation and/or activities during the visit.    Electronically signed by: San Jetty. Owens Shark, [place] 01.27.2022 4:17 PM  Gardiner Sleeper, M.D., Ph.D. Diseases & Surgery of the Retina and Vitreous [facility] [place] [facility]  I have reviewed the above documentation for accuracy and completeness, and I agree with the above. Gardiner Sleeper, M.D., Ph.D. 10/08/20 4:17 PM   Abbreviations: M myopia (nearsighted); A astigmatism; H hyperopia (farsighted); P presbyopia; Mrx spectacle prescription;  CTL contact lenses; OD right eye; OS left eye; OU both eyes  XT exotropia; ET esotropia; PEK punctate epithelial keratitis; PEE punctate epithelial erosions; DES dry eye syndrome; MGD meibomian gland dysfunction; ATs artificial tears; PFAT's preservative free artificial tears; [place] nuclear sclerotic  cataract; PSC posterior subcapsular cataract; ERM epi-retinal membrane; PVD posterior vitreous detachment; RD retinal detachment; DM diabetes mellitus; DR diabetic retinopathy; NPDR non-proliferative diabetic retinopathy; PDR proliferative diabetic retinopathy; CSME clinically significant macular edema; DME diabetic macular edema; dbh dot blot hemorrhages; CWS cotton wool spot; POAG primary open angle glaucoma; C/D cup-to-disc ratio; HVF humphrey visual field; GVF goldmann visual field; OCT optical coherence tomography; IOP intraocular pressure; BRVO Branch retinal vein occlusion; CRVO central retinal vein occlusion; CRAO central retinal artery occlusion; BRAO branch retinal artery occlusion; RT retinal tear; SB scleral buckle; PPV pars plana vitrectomy; VH Vitreous hemorrhage; PRP panretinal laser  photocoagulation; IVK intravitreal kenalog; VMT vitreomacular traction; MH Macular hole;  NVD neovascularization of the disc; NVE neovascularization elsewhere; AREDS age related eye disease study; ARMD age related macular degeneration; POAG primary open angle glaucoma; EBMD epithelial/anterior basement membrane dystrophy; ACIOL anterior chamber intraocular lens; IOL intraocular lens; PCIOL posterior chamber intraocular lens; Phaco/IOL phacoemulsification with intraocular lens placement; PRK photorefractive keratectomy; LASIK laser assisted in situ keratomileusis; HTN hypertension; DM diabetes mellitus; COPD chronic obstructive pulmonary disease  

## 2020-10-05 ENCOUNTER — Encounter (INDEPENDENT_AMBULATORY_CARE_PROVIDER_SITE_OTHER): Payer: 59 | Admitting: Ophthalmology

## 2020-10-05 ENCOUNTER — Other Ambulatory Visit: Payer: Self-pay

## 2020-10-05 ENCOUNTER — Ambulatory Visit (INDEPENDENT_AMBULATORY_CARE_PROVIDER_SITE_OTHER): Payer: 59 | Admitting: Ophthalmology

## 2020-10-05 ENCOUNTER — Encounter (INDEPENDENT_AMBULATORY_CARE_PROVIDER_SITE_OTHER): Payer: Self-pay | Admitting: Ophthalmology

## 2020-10-05 DIAGNOSIS — E119 Type 2 diabetes mellitus without complications: Secondary | ICD-10-CM

## 2020-10-05 DIAGNOSIS — H3322 Serous retinal detachment, left eye: Secondary | ICD-10-CM

## 2020-10-05 DIAGNOSIS — H25813 Combined forms of age-related cataract, bilateral: Secondary | ICD-10-CM

## 2020-10-05 DIAGNOSIS — H33321 Round hole, right eye: Secondary | ICD-10-CM

## 2020-10-05 DIAGNOSIS — H3581 Retinal edema: Secondary | ICD-10-CM

## 2020-10-05 NOTE — Progress Notes (Signed)
[facility] Note  10/05/2020     CHIEF COMPLAINT Patient presents for Post-op Follow-up   HISTORY OF PRESENT ILLNESS: Christina [name] is a 55 y.o. female who presents to the clinic today for:   HPI    Post-op Follow-up    In left eye.  Discomfort includes itching.  Vision is worse, is blurred at distance and is blurred at near.  I, the attending physician,  performed the HPI with the patient and updated documentation appropriately.          Comments    55 y/o female pt here for 1 day POV s/p PPV OS 1.27.22.  S/p pneumatic retinopexy for repair of rheg RD OS 1.24.22.  Pt slept fairly well last night.  No pain, FOL, floaters.  No change in [place] OD.  VA OS extremely blurred and dim.  OS itches and stings a little.  Patch removed in office this a.m.       Last edited by Bernarda Caffey, MD on 10/08/2020  4:57 PM. (History)    pt states last night was "okay", she states her left eye is sore this morning   Referring physician: Hortencia Pilar, MD [place],  [place] [id number]  HISTORICAL INFORMATION:   Selected notes from the MEDICAL RECORD NUMBER Referred by Dr. Quentin Ore for RD OS LEE:  Ocular Hx- PMH-    CURRENT MEDICATIONS: Current Outpatient Medications (Ophthalmic Drugs)  Medication Sig  . neomycin-polymyxin b-dexamethasone (MAXITROL) 3.5-10000-0.1 OINT Place 1 application into the left eye 4 (four) times daily.   No current facility-administered medications for this visit. (Ophthalmic Drugs)   Current Outpatient Medications (Other)  Medication Sig  . ALPRAZolam (XANAX) 0.25 MG tablet Take 0.25 mg by mouth daily as needed for anxiety.  Marland Kitchen amitriptyline (ELAVIL) 100 MG tablet Take 0.5 tablets (50 mg total) by mouth at bedtime.  Marland Kitchen FLUoxetine (PROZAC) 40 MG capsule Take 40 mg by mouth daily.  Marland Kitchen HYDROcodone-acetaminophen (NORCO/VICODIN) 5-325 MG tablet Take 1 tablet by mouth every 4 (four) hours as needed for moderate  pain.  . metFORMIN (GLUCOPHAGE) 500 MG tablet Take 1 tablet (500 mg total) by mouth 2 (two) times daily with a meal.  . topiramate (TOPAMAX) 50 MG tablet 1/2 tab po q hs for 1 week, then inc to 1 po qhs  . vitamin B-12 (CYANOCOBALAMIN) 500 MCG tablet Take 1 tablet (500 mcg total) by mouth daily.  . Vitamin D, Ergocalciferol, (DRISDOL) 1.25 MG (50000 UNIT) CAPS capsule Take 1 capsule (50,000 Units total) by mouth every 7 (seven) days.   No current facility-administered medications for this visit. (Other)      REVIEW OF SYSTEMS: ROS    Positive for: Neurological, Eyes   Negative for: Constitutional, Gastrointestinal, Skin, Genitourinary, Musculoskeletal, HENT, Endocrine, Cardiovascular, Respiratory, Psychiatric, Allergic/Imm, Heme/Lymph   Last edited by Matthew Folks, COA on 10/05/2020  8:15 AM. (History)       ALLERGIES No Known Allergies  PAST MEDICAL HISTORY Past Medical History:  Diagnosis Date  . Anemia   . Arthritis    hands and knees  . 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 Cataracts, bilateral   . Depression   . GAD (generalized anxiety disorder)   . Gallbladder problem   . History of ovarian cyst   .  IBS (irritable bowel syndrome)   . Joint pain   . PONV (postoperative nausea and vomiting)    does well with scop patch  . Pre-diabetes    followed by pcp; on metformin for weight loss -Dr Mellody Dance  . Retinal detachment    Rheg OS  . Right knee meniscal tear   . Urgency of urination    Past Surgical History:  Procedure Laterality Date  . ABDOMINAL HYSTERECTOMY  05/1996   endometriosis  . ACHILLES TENDON REPAIR Right 2010;  revision 2011  . BLADDER SUSPENSION  2000  . BREAST BIOPSY Left 10/2015  . BREAST IMPLANT REMOVAL Bilateral 08/12/2017   Procedure: REMOVAL BILATERAL BREAST IMPLANTS;  Surgeon:  Wallace Going, DO;  Location: [facility];  Service: Plastics;  Laterality: Bilateral;  . BREAST RECONSTRUCTION WITH PLACEMENT OF TISSUE EXPANDER AND FLEX HD (ACELLULAR HYDRATED DERMIS) Bilateral 01/09/2016  . BREAST RECONSTRUCTION WITH PLACEMENT OF TISSUE EXPANDER AND FLEX HD (ACELLULAR HYDRATED DERMIS) Bilateral 01/09/2016   Procedure: BREAST RECONSTRUCTION WITH PLACEMENT OF TISSUE EXPANDER AND FLEX HD (ACELLULAR HYDRATED DERMIS);  Surgeon: Loel Lofty Dillingham, DO;  Location: [facility];  Service: Plastics;  Laterality: Bilateral;  . BREAST RECONSTRUCTION WITH PLACEMENT OF TISSUE EXPANDER AND FLEX HD (ACELLULAR HYDRATED DERMIS) Bilateral 05/29/2016   Procedure: PLACEMENT OF BILATERAL TISSUE EXPANDER AND FLEX HD (ACELLULAR HYDRATED DERMIS);  Surgeon: Wallace Going, DO;  Location: [facility];  Service: Plastics;  Laterality: Bilateral;  . BREAST REDUCTION SURGERY Bilateral 11/26/2016   Procedure: BILATERAL BREAST CAPSULE CONTRACTURE RELASE;  Surgeon: Wallace Going, DO;  Location: [place];  Service: Plastics;  Laterality: Bilateral;  . [place]; 1989; 1991  . ELBOW SURGERY Right x3   last one 02-01-2019 @ [facility]  . EYE SURGERY Left 10/01/2020   Pneumatic retinopexy for repair of rheg RD - Dr. Bernarda Caffey  . EYE SURGERY Left 10/04/2020   PPV - Dr. Bernarda Caffey  . FAT GRAFTING BILATERAL BREAST  08-09-2018  _0   . GAS INSERTION Left 10/04/2020   Procedure: INSERTION OF GAS;  Surgeon: Bernarda Caffey, MD;  Location: [place];  Service: Ophthalmology;  Laterality: Left;  Marland Kitchen GAS/FLUID EXCHANGE Left 10/04/2020   Procedure: GAS/FLUID EXCHANGE;  Surgeon: Bernarda Caffey, MD;  Location: [place];  Service: Ophthalmology;  Laterality: Left;  . INCISION AND DRAINAGE OF WOUND Bilateral 02/11/2016   Procedure: IRRIGATION AND DEBRIDEMENT OF BILATERAL BREAST POCKET;  Surgeon: Wallace Going, DO;  Location: [place];  Service: Plastics;   Laterality: Bilateral;  . KNEE ARTHROSCOPY WITH MEDIAL MENISECTOMY Right 12/20/2019   Procedure: KNEE ARTHROSCOPY WITH MEDIAL MENISECTOMY;  Surgeon: Marchia Bond, MD;  Location: [facility];  Service: Orthopedics;  Laterality: Right;  . LAPAROSCOPIC APPENDECTOMY  04-07-2011   _1    w/ Excision peritoneal lipoma and lysis adhesions  . LAPAROSCOPIC CHOLECYSTECTOMY  ~ 1999  . LASER PHOTO ABLATION Right 10/04/2020   Procedure: LASER RETINOPEXY WITH INDIRECT LASER OPTHALMOSCOPE, RIGHT EYE;  Surgeon: Bernarda Caffey, MD;  Location: [place] [facility];  Service: Ophthalmology;  Laterality: Right;  . LIPOSUCTION WITH LIPOFILLING Bilateral 11/26/2016   Procedure: LIPOFILLING FOR SYMMETRY;  Surgeon: Wallace Going, DO;  Location: [facility];  Service: Plastics;  Laterality: Bilateral;  . LIPOSUCTION WITH LIPOFILLING Bilateral 01/21/2017   Procedure: BILATERAL BREAST  LIPOFILLING FOR ASYMMETRY;  Surgeon: Wallace Going, DO;  Location: [facility];  Service: Plastics;  Laterality: Bilateral;  . LIPOSUCTION WITH  LIPOFILLING Bilateral 06/28/2020   Procedure: Lipofilling bilateral breasts for asymmetry;  Surgeon: Wallace Going, DO;  Location: [facility];  Service: Plastics;  Laterality: Bilateral;  90 min  . MASTECTOMY Bilateral 01/09/2016  . NIPPLE SPARING MASTECTOMY/SENTINAL LYMPH NODE BIOPSY/RECONSTRUCTION/PLACEMENT OF TISSUE EXPANDER Bilateral 01/09/2016   Procedure: BILATERAL NIPPLE SPARING MASTECTOMY WITH LEFT SENTINAL LYMPH NODE BIOPSY ;  Surgeon: Stark Klein, MD;  Location: [facility];  Service: General;  Laterality: Bilateral;  . PHOTOCOAGULATION WITH LASER Left 10/04/2020   Procedure: PHOTOCOAGULATION WITH LASER;  Surgeon: Bernarda Caffey, MD;  Location: [facility];  Service: Ophthalmology;  Laterality: Left;  . REMOVAL OF BILATERAL TISSUE EXPANDERS WITH PLACEMENT OF BILATERAL BREAST IMPLANTS Bilateral 08/20/2016   Procedure: REMOVAL OF BILATERAL TISSUE  EXPANDERS WITH PLACEMENT OF BILATERAL SILICONE IMPLANTS;  Surgeon: Wallace Going, DO;  Location: [facility];  Service: Plastics;  Laterality: Bilateral;  . REMOVAL OF BILATERAL TISSUE EXPANDERS WITH PLACEMENT OF BILATERAL BREAST IMPLANTS Bilateral 11/05/2017   Procedure: REMOVAL OF BILATERAL TISSUE EXPANDERS WITH PLACEMENT OF BILATERAL BREAST SILICONE IMPLANTS;  Surgeon: Wallace Going, DO;  Location: [facility];  Service: Plastics;  Laterality: Bilateral;  . REMOVAL OF TISSUE EXPANDER Bilateral 02/11/2016   Procedure: REMOVAL OF BILATERAL TISSUE EXPANDERS AND FLEX HD REMOVAL;  Surgeon: Wallace Going, DO;  Location: [facility];  Service: Plastics;  Laterality: Bilateral;  . RETINAL DETACHMENT SURGERY Left 10/01/2020   Pneumatic retinopexy for repair of rheg RD - Dr. Bernarda Caffey  . RETINAL DETACHMENT SURGERY Left 10/04/2020   PPV - Dr. Bernarda Caffey  . SCLERAL BUCKLE Left 10/04/2020   Procedure: SCLERAL BUCKLE LEFT EYE;  Surgeon: Bernarda Caffey, MD;  Location: [facility];  Service: Ophthalmology;  Laterality: Left;  . TISSUE EXPANDER PLACEMENT Bilateral 08/12/2017   Procedure: PLACEMENT OF BILATERAL TISSUE EXPANDER;  Surgeon: Wallace Going, DO;  Location: [place];  Service: Plastics;  Laterality: Bilateral;  . TUBAL LIGATION Bilateral 1991  . VITRECTOMY 25 GAUGE WITH SCLERAL BUCKLE Left 10/04/2020   Procedure: 25 GAUGE PARS PLANA VITRECTOMY LEFT EYE ;  Surgeon: Bernarda Caffey, MD;  Location: [facility];  Service: Ophthalmology;  Laterality: Left;    FAMILY HISTORY Family History  Problem Relation Age of Onset  . Heart failure Father   . Prostate cancer Father 24  . Retinal detachment Father   . Colon polyps Mother        approx 2  . Other Mother        hx HPV and hysterectomy due to precancerous cells  . Depression Mother   . Anxiety disorder Mother   . Other Sister 85       hx of hysterectomy for unspecified reason;  still has ovaries  . Other Sister 4       paternal half-sister hx of hysterectomy for unspecified reason; still has ovaries  . Bladder Cancer Maternal Uncle 79       not a smoker  . Kidney failure Maternal Grandmother   . Congestive Heart Failure Maternal Grandmother   . Colon cancer Maternal Grandmother 27  . Diabetes Maternal Grandmother   . Lung cancer Maternal Grandfather 84       smoker  . Breast cancer Paternal Grandmother        dx. early 83s; w/ hx of trauma to breast  . Crohn's disease Daughter   . Lung cancer Maternal Uncle 37       smoker    SOCIAL  HISTORY Social History   Tobacco Use  . Smoking status: Former Smoker    Packs/day: 1.00    Years: 10.00    Pack years: 10.00    Types: Cigarettes    Quit date: 06/08/2008    Years since quitting: 12.3  . Smokeless tobacco: Never Used  Vaping Use  . Vaping Use: Never used  Substance Use Topics  . Alcohol use: No  . Drug use: Never         OPHTHALMIC EXAM:  Base Eye Exam    Visual Acuity (Snellen - Linear)      Right Left   Dist [place]  CF @ face   Dist ph [place]  NI       Tonometry (Tonopen, 8:19 AM)      Right Left   Pressure Def 18       Pupils      Dark Light Shape React APD   Right 4 3 Round Brisk None   Left 5 5 Round Minimal None       Visual Fields (Counting fingers)      Left Right     Full   Restrictions Partial outer superior temporal, inferior temporal, superior nasal, inferior nasal deficiencies        Extraocular Movement      Right Left    Full, Ortho Full, Ortho       Neuro/Psych    Oriented x3: Yes   Mood/Affect: Normal       Dilation    Left eye: 1.0% Mydriacyl, 2.5% Phenylephrine @ 8:19 AM        Slit Lamp and Fundus Exam    External Exam      Right Left   External  Periorbital edema       Slit Lamp Exam      Right Left   Lids/Lashes Dermatochalasis - upper lid Dermatochalasis - upper lid   Conjunctiva/Sclera White and quiet Subconjunctival hemorrhage, sutures  intact   Cornea 2+ Punctate epithelial erosions, mild tear film debris Epi defect   Anterior Chamber Deep and quiet moderate depth, clear   Iris Round and dilated Round and dilated, mild anterior bowing   Lens 1-2+ Nuclear sclerosis, 1-2+ Cortical cataract 1-2+ Nuclear sclerosis, 1-2+ Cortical cataract, 2+ PC feathering   Vitreous Vitreous syneresis, Posterior vitreous detachment, Weiss ring post vitrectomy       Fundus Exam      Right Left   Disc  Pink and Sharp, mild tilt, temporal PPA   C/D Ratio 0.3 0.4   Macula  Hazy view, flat under gas   Vessels  mild attenuation   Periphery Attached, small operculated hole at 0430, pigmented lattice degeneration inferiorly -- good early laser changes surrounding hazy view, retina attached over buckle, Good buckle height, good laser over buckle and around tear, PRE-OP: Bullous inferior detachment from 0300-0800, Good early cryo changes, SRF extending to 0700; ORIGINALLY: Bullous RD from 0100-230, large HST at 0130; mild pavingstone degen inferiorly          IMAGING AND PROCEDURES  Imaging and Procedures for 10/05/2020           ASSESSMENT/PLAN:    ICD-10-CM   1. Left retinal detachment  H33.22   2. Retinal edema  H35.81   3. Retinal hole of right eye  H33.321   4. Diabetes mellitus type 2 without retinopathy ([place])  E11.9   5. Combined forms of age-related cataract of both eyes  H25.813     1,2.  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 - detached from 1 to 230 oclock w/ SRF still outside of ST arcades, large horseshoe tear at 130 - s/p pneumatic retinopexy OS (01.24.22) -- had progressive worsening of SRF post-pneumatic  - now POD1 s/p PPV/POC/EL/FAX/14% C3F8 OS, 01.28.2022             - doing well this morning             - retina attached and in good position -- good buckle height and laser around breaks             - IOP mildly elevated              - start   Lotemax 4x/day OU                          oflaxacin QID OS                          Atropine BID OS                          Brimonidine BID OS                          Maxitrol ung QID OS              - cont face down positioning x3 days; avoid laying flat on back              - eye shield when sleeping              - post op drop and positioning instructions reviewed              - tylenol/ibuprofen for pain  - Rx given for breakthrough pain - f/u 1 weeks, DFE OU  3. Retinal hole, OD - small operculated hole at 0430 - s/p laser retinopexy OD (01.27.22) in OR - start Lotemax QID OD x7 days  4. Diabetes mellitus, type 2 without retinopathy - The incidence, risk factors for progression, natural history and treatment options for diabetic retinopathy  were discussed with patient.   - The need for close monitoring of blood glucose, blood pressure, and serum lipids, avoiding cigarette or any type of tobacco, and the need for long term follow up was also discussed with patient. - monitor  5. Mixed Cataract OU - The symptoms of cataract, surgical options, and treatments and risks were discussed with patient. - discussed diagnosis and progression - not yet visually significant - monitor for now   Ophthalmic Meds Ordered this visit:  No orders of the defined types were placed in this encounter.     Return in about 6 days (around 10/11/2020) for f/u RD OS, POV.  There are no Patient Instructions on file for this visit.   Explained the diagnoses, plan, and follow up with the patient and they expressed understanding.  Patient expressed understanding of the importance of proper follow up care.   This document serves as a record of services personally performed by Gardiner Sleeper, MD, PhD. It was created on their behalf by San Jetty. Owens Shark, OA an ophthalmic technician. The creation of this record is the provider's dictation and/or activities during the visit.    Electronically signed by: San Jetty. [place], [place] 01.28.2022  5:02 PM  Aaron Edelman  Antony Haste, M.D., Ph.D. Diseases & Surgery of the Retina and Vitreous [facility]  I have reviewed the above documentation for accuracy and completeness, and I agree with the above. Gardiner Sleeper, M.D., Ph.D. 10/08/20 5:02 PM    Abbreviations: M myopia (nearsighted); A astigmatism; H hyperopia (farsighted); P presbyopia; Mrx spectacle prescription;  CTL contact lenses; OD right eye; OS left eye; OU both eyes  XT exotropia; ET esotropia; PEK punctate epithelial keratitis; PEE punctate epithelial erosions; DES dry eye syndrome; MGD meibomian gland dysfunction; ATs artificial tears; PFAT's preservative free artificial tears; [place] nuclear sclerotic cataract; PSC posterior subcapsular cataract; ERM epi-retinal membrane; PVD posterior vitreous detachment; RD retinal detachment; DM diabetes mellitus; DR diabetic retinopathy; NPDR non-proliferative diabetic retinopathy; PDR proliferative diabetic retinopathy; CSME clinically significant macular edema; DME diabetic macular edema; dbh dot blot hemorrhages; CWS cotton wool spot; POAG primary open angle glaucoma; C/D cup-to-disc ratio; HVF humphrey visual field; GVF goldmann visual field; OCT optical coherence tomography; IOP intraocular pressure; BRVO Branch retinal vein occlusion; CRVO central retinal vein occlusion; CRAO central retinal artery occlusion; BRAO branch retinal artery occlusion; RT retinal tear; SB scleral buckle; PPV pars plana vitrectomy; VH Vitreous hemorrhage; PRP panretinal laser photocoagulation; IVK intravitreal kenalog; VMT vitreomacular traction; MH Macular hole;  NVD neovascularization of the disc; NVE neovascularization elsewhere; AREDS age related eye disease study; ARMD age related macular degeneration; POAG primary open angle glaucoma; EBMD epithelial/anterior basement membrane dystrophy; ACIOL anterior chamber intraocular lens; IOL intraocular lens; PCIOL posterior chamber intraocular lens;  Phaco/IOL phacoemulsification with intraocular lens placement; [place] photorefractive keratectomy; LASIK laser assisted in situ keratomileusis; HTN hypertension; DM diabetes mellitus; COPD chronic obstructive pulmonary disease

## 2020-10-08 ENCOUNTER — Encounter (INDEPENDENT_AMBULATORY_CARE_PROVIDER_SITE_OTHER): Payer: Self-pay | Admitting: Ophthalmology

## 2020-10-08 ENCOUNTER — Other Ambulatory Visit: Payer: Self-pay

## 2020-10-08 ENCOUNTER — Encounter (INDEPENDENT_AMBULATORY_CARE_PROVIDER_SITE_OTHER): Payer: Self-pay | Admitting: Family Medicine

## 2020-10-08 ENCOUNTER — Telehealth (INDEPENDENT_AMBULATORY_CARE_PROVIDER_SITE_OTHER): Payer: 59 | Admitting: Family Medicine

## 2020-10-08 VITALS — Ht 68.0 in | Wt 206.0 lb

## 2020-10-08 DIAGNOSIS — F39 Unspecified mood [affective] disorder: Secondary | ICD-10-CM

## 2020-10-08 DIAGNOSIS — E538 Deficiency of other specified B group vitamins: Secondary | ICD-10-CM | POA: Diagnosis not present

## 2020-10-08 DIAGNOSIS — R7303 Prediabetes: Secondary | ICD-10-CM

## 2020-10-08 DIAGNOSIS — Z6830 Body mass index (BMI) 30.0-30.9, adult: Secondary | ICD-10-CM

## 2020-10-08 DIAGNOSIS — E559 Vitamin D deficiency, unspecified: Secondary | ICD-10-CM

## 2020-10-08 DIAGNOSIS — E669 Obesity, unspecified: Secondary | ICD-10-CM

## 2020-10-08 DIAGNOSIS — Z9189 Other specified personal risk factors, not elsewhere classified: Secondary | ICD-10-CM | POA: Insufficient documentation

## 2020-10-08 MED ORDER — TOPIRAMATE 50 MG PO TABS: ORAL_TABLET | ORAL | 0 refills | Status: DC

## 2020-10-09 ENCOUNTER — Encounter (INDEPENDENT_AMBULATORY_CARE_PROVIDER_SITE_OTHER): Payer: 59 | Admitting: Ophthalmology

## 2020-10-09 NOTE — Progress Notes (Signed)
TeleHealth Visit:  Due to the COVID-19 pandemic, this visit was completed with telemedicine (audio/video) technology to reduce patient and provider exposure as well as to preserve personal protective equipment.   Christina [name] has verbally consented to this TeleHealth visit. The patient is located at home, the provider is located at the [facility] office. The participants in this visit include the listed provider and patient. The visit was conducted today via video.   Chief Complaint: OBESITY Christina [name] is here to discuss her progress with her obesity treatment plan along with follow-up of her obesity related diagnoses. [name] is on the Category 3 Plan and states she is following her eating plan approximately 80% of the time. [name] states she is walking 15 minutes 3-4 times per week.  Today's visit was #: 8 Starting weight: 218 lbs Starting date: 05/15/2020  Interim History: Christina [name] had retinal detachment surgery emergently in the last 2 weeks and has been unable to move (not even doing laundry or housework). Increased stress and stress eating.  Plan: Bring in recent labs from PCP OV for my review to prevent repeat labwork.  Assessment/Plan:   Meds ordered this encounter  Medications  . topiramate (TOPAMAX) 50 MG tablet    Sig: 1/2 tab po q hs for 1 week, then inc to 1 po qhs    Dispense:  30 tablet    Refill:  0     1. Pre-diabetes Pt with fasting blood sugar at surgeon's office recently was between 60-100 and they worried her about being diabetic now.  Her A1c was 6.3 on 05/15/2020.  Lab Results  Component Value Date   HGBA1C 6.3 (H) 05/15/2020   Lab Results  Component Value Date   INSULIN 34.7 (H) 05/15/2020   Plan: Check A1c and fasting insulin at next OV. Continue Metformin 500 mg BID. Pt denies need for refill.  2. Vitamin D deficiency Piper's Vitamin D level was 21.2 on 05/15/2020. She is currently taking prescription vitamin D 50,000 IU each week. She denies  nausea, vomiting or muscle weakness.   Ref. Range 05/15/2020 09:36  Vitamin D, 25-Hydroxy Latest Ref Range: 30.0 - 100.0 ng/mL 21.2 (L)   Plan: Check labs at next OV. Continue current treatment plan. Low Vitamin D level contributes to fatigue and are associated with obesity, breast, and colon cancer. She agrees to continue to take prescription Vitamin D @50 ,000 IU every week and will follow-up for routine testing of Vitamin D, at least 2-3 times per year to avoid over-replacement.  3. Vitamin B12 deficiency Christina [name] is not a vegetarian. She takes OTC B12 500 mcg supplement daily.  Lab Results  Component Value Date   [id number] (L) 05/15/2020   Plan: Continue current treatment plan. The diagnosis was reviewed with the patient. Counseling provided today, see below. We will continue to monitor. Orders and follow up as documented in patient record.  Counseling . The body needs vitamin B12: to make red blood cells; to make DNA; and to help the nerves work properly so they can carry messages from the brain to the body.  . The main causes of vitamin B12 deficiency include dietary deficiency, digestive diseases, pernicious anemia, and having a surgery in which part of the stomach or small intestine is removed.  . Certain medicines can make it harder for the body to absorb vitamin B12. These medicines include: heartburn medications; some antibiotics; some medications used to treat diabetes, gout, and high cholesterol.  . In some cases, there are  no symptoms of this condition. If the condition leads to anemia or nerve damage, various symptoms can occur, such as weakness or fatigue, shortness of breath, and numbness or tingling in your hands and feet.   . Treatment:  o May include taking vitamin B12 supplements.  o Avoid alcohol.  o Eat lots of healthy foods that contain vitamin B12: - Beef, pork, chicken, [place], and organ meats, such as liver.  - Seafood: This includes clams, rainbow trout, salmon,  tuna, and haddock. Eggs.  - Cereal and dairy products that are fortified: This means that vitamin B12 has been added to the food.   4. Mood disorder (Ducor), with emotional eating Pt reports increased emotional eating. She asks about meds to help her. She is taking Prozac daily, Elavil at bedtime, and Xanax prn panic attacks.  Plan: Start Topamax 20 mg tablet. Take 1/2 tab for 1 week, then increase to 50 mg as tolerated if no side effects. Continue Prozac per PCP and we will monitor.  Start- topiramate (TOPAMAX) 50 MG tablet; 1/2 tab po q hs for 1 week, then inc to 1 po qhs  Dispense: 30 tablet; Refill: 0  5. Class 1 obesity with serious comorbidity and body mass index (BMI) of 30.0 to 30.9 in adult, unspecified obesity type Christina [name] is currently in the action stage of change. As such, her goal is to continue with weight loss efforts. She has agreed to the Category 3 Plan.   Exercise goals: As is, per ortho docotr restrictions at this time.  Behavioral modification strategies: increasing lean protein intake, decreasing simple carbohydrates, better snacking choices, emotional eating strategies, avoiding temptations and planning for success.  Christina [name] has agreed to follow-up with our clinic in 2 weeks, on 10/23/2020 at 0720 (come fasting and bring recent labwork form PCP). She was informed of the importance of frequent follow-up visits to maximize her success with intensive lifestyle modifications for her multiple health conditions.  Objective:   VITALS: Per patient if applicable, see vitals. GENERAL: Alert and in no acute distress. CARDIOPULMONARY: No increased WOB. Speaking in clear sentences.  PSYCH: Pleasant and cooperative. Speech normal rate and rhythm. Affect is appropriate. Insight and judgement are appropriate. Attention is focused, linear, and appropriate.  NEURO: Oriented as arrived to appointment on time with no prompting.   Lab Results  Component Value Date   CREATININE 0.71  05/15/2020   BUN 10 05/15/2020   NA 138 05/15/2020   K 4.7 05/15/2020   CL 105 05/15/2020   CO2 23 05/15/2020   Lab Results  Component Value Date   ALT 34 (H) 05/15/2020   AST 23 05/15/2020   ALKPHOS 52 05/15/2020   BILITOT 0.3 05/15/2020   Lab Results  Component Value Date   HGBA1C 6.3 (H) 05/15/2020   Lab Results  Component Value Date   INSULIN 34.7 (H) 05/15/2020   Lab Results  Component Value Date   TSH 1.730 05/15/2020   Lab Results  Component Value Date   CHOL 125 05/15/2020   HDL 36 (L) 05/15/2020   LDLCALC 71 05/15/2020   TRIG 95 05/15/2020   Lab Results  Component Value Date   WBC 6.8 05/15/2020   HGB 13.8 05/15/2020   HCT 42.0 05/15/2020   MCV 87 05/15/2020   PLT 308 05/15/2020   No results found for: IRON, TIBC, FERRITIN  Attestation Statements:   Reviewed by clinician on day of visit: allergies, medications, problem list, medical history, surgical history, family history, social history, and  previous encounter notes.  Coral Ceo, am acting as Location manager for [employer], DO.  I have reviewed the above documentation for accuracy and completeness, and I agree with the above. Marjory Sneddon, D.O.  The [place] was signed into law in 2016 which includes the topic of electronic health records.  This provides immediate access to information in MyChart.  This includes consultation notes, operative notes, office notes, lab results and pathology reports.  If you have any questions about what you read please let us know at your next visit so we can discuss your concerns and take corrective action if need be.  We are right here with you.

## 2020-10-10 NOTE — Progress Notes (Signed)
[facility] Note  10/11/2020     CHIEF COMPLAINT Patient presents for Retina Follow Up   HISTORY OF PRESENT ILLNESS: Christina [name] is a 55 y.o. female who presents to the clinic today for:   HPI    Retina Follow Up    Patient presents with  Other.  In left eye.  This started 7 days ago.  I, the attending physician,  performed the HPI with the patient and updated documentation appropriately.          Comments    Patient here for 7 days retina follow up for po RD repair OS. Patient states vision OS all blurry. Hasn't seen bubble yet. When uses atropine it burns real bad cause OD to tear. Still using drops.       Last edited by Bernarda Caffey, MD on 10/11/2020  9:15 AM. (History)    pt states    Referring physician: Maurice Small, MD [address],  [place] [address]  HISTORICAL INFORMATION:   Selected notes from the MEDICAL RECORD NUMBER Referred by Dr. Quentin Ore for RD OS LEE:  Ocular Hx- PMH-    CURRENT MEDICATIONS: Current Outpatient Medications (Ophthalmic Drugs)  Medication Sig  . neomycin-polymyxin b-dexamethasone (MAXITROL) 3.5-10000-0.1 OINT Place 1 application into the left eye 4 (four) times daily.   No current facility-administered medications for this visit. (Ophthalmic Drugs)   Current Outpatient Medications (Other)  Medication Sig  . ALPRAZolam (XANAX) 0.25 MG tablet Take 0.25 mg by mouth daily as needed for anxiety.  Marland Kitchen amitriptyline (ELAVIL) 100 MG tablet Take 0.5 tablets (50 mg total) by mouth at bedtime.  Marland Kitchen FLUoxetine (PROZAC) 40 MG capsule Take 40 mg by mouth daily.  Marland Kitchen HYDROcodone-acetaminophen (NORCO/VICODIN) 5-325 MG tablet Take 1 tablet by mouth every 4 (four) hours as needed for moderate pain.  . metFORMIN (GLUCOPHAGE) 500 MG tablet Take 1 tablet (500 mg total) by mouth 2 (two) times daily with a meal.  . topiramate (TOPAMAX) 50 MG tablet 1/2 tab po q hs for 1 week, then inc to 1 po qhs  .  vitamin B-12 (CYANOCOBALAMIN) 500 MCG tablet Take 1 tablet (500 mcg total) by mouth daily.  . Vitamin D, Ergocalciferol, (DRISDOL) 1.25 MG (50000 UNIT) CAPS capsule Take 1 capsule (50,000 Units total) by mouth every 7 (seven) days.   No current facility-administered medications for this visit. (Other)      REVIEW OF SYSTEMS: ROS    Positive for: Neurological, Eyes   Negative for: Constitutional, Gastrointestinal, Skin, Genitourinary, Musculoskeletal, HENT, Endocrine, Cardiovascular, Respiratory, Psychiatric, Allergic/Imm, Heme/Lymph   Last edited by Theodore Demark, COA on 10/11/2020  8:46 AM. (History)       ALLERGIES No Known Allergies  PAST MEDICAL HISTORY Past Medical History:  Diagnosis Date  . Anemia   . Arthritis    hands and knees  . 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 Cataracts, bilateral   . Depression   . GAD (generalized anxiety disorder)   . Gallbladder problem   . History of ovarian cyst   . IBS (irritable bowel syndrome)   . Joint pain   . PONV (postoperative nausea and vomiting)    does well with scop patch  . Pre-diabetes    followed by pcp; on metformin for weight  loss -Dr Mellody Dance  . Retinal detachment    Rheg OS  . Right knee meniscal tear   . Urgency of urination    Past Surgical History:  Procedure Laterality Date  . ABDOMINAL HYSTERECTOMY  05/1996   endometriosis  . ACHILLES TENDON REPAIR Right 2010;  revision 2011  . BLADDER SUSPENSION  2000  . BREAST BIOPSY Left 10/2015  . BREAST IMPLANT REMOVAL Bilateral 08/12/2017   Procedure: REMOVAL BILATERAL BREAST IMPLANTS;  Surgeon: Wallace Going, DO;  Location: [place];  Service: Plastics;  Laterality: Bilateral;  . BREAST RECONSTRUCTION WITH PLACEMENT OF TISSUE EXPANDER AND FLEX HD (ACELLULAR HYDRATED DERMIS)  Bilateral 01/09/2016  . BREAST RECONSTRUCTION WITH PLACEMENT OF TISSUE EXPANDER AND FLEX HD (ACELLULAR HYDRATED DERMIS) Bilateral 01/09/2016   Procedure: BREAST RECONSTRUCTION WITH PLACEMENT OF TISSUE EXPANDER AND FLEX HD (ACELLULAR HYDRATED DERMIS);  Surgeon: Loel Lofty Dillingham, DO;  Location: [place];  Service: Plastics;  Laterality: Bilateral;  . BREAST RECONSTRUCTION WITH PLACEMENT OF TISSUE EXPANDER AND FLEX HD (ACELLULAR HYDRATED DERMIS) Bilateral 05/29/2016   Procedure: PLACEMENT OF BILATERAL TISSUE EXPANDER AND FLEX HD (ACELLULAR HYDRATED DERMIS);  Surgeon: Wallace Going, DO;  Location: [place];  Service: Plastics;  Laterality: Bilateral;  . BREAST REDUCTION SURGERY Bilateral 11/26/2016   Procedure: BILATERAL BREAST CAPSULE CONTRACTURE RELASE;  Surgeon: Wallace Going, DO;  Location: [place];  Service: Plastics;  Laterality: Bilateral;  . [place]; 1989; 1991  . ELBOW SURGERY Right x3   last one 02-01-2019 @ [facility]  . EYE SURGERY Left 10/01/2020   Pneumatic retinopexy for repair of rheg RD - Dr. Bernarda Caffey  . EYE SURGERY Left 10/04/2020   PPV - Dr. Bernarda Caffey  . FAT GRAFTING BILATERAL BREAST  08-09-2018  _0   . GAS INSERTION Left 10/04/2020   Procedure: INSERTION OF GAS;  Surgeon: Bernarda Caffey, MD;  Location: [place];  Service: Ophthalmology;  Laterality: Left;  Marland Kitchen GAS/FLUID EXCHANGE Left 10/04/2020   Procedure: GAS/FLUID EXCHANGE;  Surgeon: Bernarda Caffey, MD;  Location: [place];  Service: Ophthalmology;  Laterality: Left;  . INCISION AND DRAINAGE OF WOUND Bilateral 02/11/2016   Procedure: IRRIGATION AND DEBRIDEMENT OF BILATERAL BREAST POCKET;  Surgeon: Wallace Going, DO;  Location: [place];  Service: Plastics;  Laterality: Bilateral;  . KNEE ARTHROSCOPY WITH MEDIAL MENISECTOMY Right 12/20/2019   Procedure: KNEE ARTHROSCOPY WITH MEDIAL MENISECTOMY;  Surgeon: Marchia Bond, MD;  Location: [place];  Service: Orthopedics;  Laterality: Right;  . LAPAROSCOPIC APPENDECTOMY  04-07-2011   _1    w/ Excision peritoneal lipoma and lysis adhesions  . LAPAROSCOPIC CHOLECYSTECTOMY  ~ 1999  . LASER PHOTO ABLATION Right 10/04/2020   Procedure: LASER RETINOPEXY WITH INDIRECT LASER OPTHALMOSCOPE, RIGHT EYE;  Surgeon: Bernarda Caffey, MD;  Location: [place];  Service: Ophthalmology;  Laterality: Right;  . LIPOSUCTION WITH LIPOFILLING Bilateral 11/26/2016   Procedure: LIPOFILLING FOR SYMMETRY;  Surgeon: Wallace Going, DO;  Location: [place];  Service: Plastics;  Laterality: Bilateral;  . LIPOSUCTION WITH LIPOFILLING Bilateral 01/21/2017   Procedure: BILATERAL BREAST  LIPOFILLING FOR ASYMMETRY;  Surgeon: Wallace Going, DO;  Location: [place];  Service: Plastics;  Laterality: Bilateral;  . LIPOSUCTION WITH LIPOFILLING Bilateral 06/28/2020   Procedure: Lipofilling bilateral breasts for asymmetry;  Surgeon: Wallace Going, DO;  Location: [place];  Service: Plastics;  Laterality: Bilateral;  90 min  . MASTECTOMY Bilateral 01/09/2016  .  NIPPLE SPARING MASTECTOMY/SENTINAL LYMPH NODE BIOPSY/RECONSTRUCTION/PLACEMENT OF TISSUE EXPANDER Bilateral 01/09/2016   Procedure: BILATERAL NIPPLE SPARING MASTECTOMY WITH LEFT SENTINAL LYMPH NODE BIOPSY ;  Surgeon: Stark Klein, MD;  Location: [place];  Service: General;  Laterality: Bilateral;  . PHOTOCOAGULATION WITH LASER Left 10/04/2020   Procedure: PHOTOCOAGULATION WITH LASER;  Surgeon: Bernarda Caffey, MD;  Location: [place];  Service: Ophthalmology;  Laterality: Left;  . REMOVAL OF BILATERAL TISSUE EXPANDERS WITH PLACEMENT OF BILATERAL BREAST IMPLANTS Bilateral 08/20/2016   Procedure: REMOVAL OF BILATERAL TISSUE EXPANDERS WITH PLACEMENT OF BILATERAL SILICONE IMPLANTS;  Surgeon: Wallace Going, DO;  Location: [place];  Service: Plastics;  Laterality: Bilateral;  . REMOVAL OF BILATERAL TISSUE  EXPANDERS WITH PLACEMENT OF BILATERAL BREAST IMPLANTS Bilateral 11/05/2017   Procedure: REMOVAL OF BILATERAL TISSUE EXPANDERS WITH PLACEMENT OF BILATERAL BREAST SILICONE IMPLANTS;  Surgeon: Wallace Going, DO;  Location: [place];  Service: Plastics;  Laterality: Bilateral;  . REMOVAL OF TISSUE EXPANDER Bilateral 02/11/2016   Procedure: REMOVAL OF BILATERAL TISSUE EXPANDERS AND FLEX HD REMOVAL;  Surgeon: Wallace Going, DO;  Location: [place];  Service: Plastics;  Laterality: Bilateral;  . RETINAL DETACHMENT SURGERY Left 10/01/2020   Pneumatic retinopexy for repair of rheg RD - Dr. Bernarda Caffey  . RETINAL DETACHMENT SURGERY Left 10/04/2020   PPV - Dr. Bernarda Caffey  . SCLERAL BUCKLE Left 10/04/2020   Procedure: SCLERAL BUCKLE LEFT EYE;  Surgeon: Bernarda Caffey, MD;  Location: [place];  Service: Ophthalmology;  Laterality: Left;  . TISSUE EXPANDER PLACEMENT Bilateral 08/12/2017   Procedure: PLACEMENT OF BILATERAL TISSUE EXPANDER;  Surgeon: Wallace Going, DO;  Location: [place];  Service: Plastics;  Laterality: Bilateral;  . TUBAL LIGATION Bilateral 1991  . VITRECTOMY 25 GAUGE WITH SCLERAL BUCKLE Left 10/04/2020   Procedure: 25 GAUGE PARS PLANA VITRECTOMY LEFT EYE ;  Surgeon: Bernarda Caffey, MD;  Location: [place];  Service: Ophthalmology;  Laterality: Left;    FAMILY HISTORY Family History  Problem Relation Age of Onset  . Heart failure Father   . Prostate cancer Father 42  . Retinal detachment Father   . Colon polyps Mother        approx 2  . Other Mother        hx HPV and hysterectomy due to precancerous cells  . Depression Mother   . Anxiety disorder Mother   . Other Sister 57       hx of hysterectomy for unspecified reason; still has ovaries  . Other Sister 75       paternal half-sister hx of hysterectomy for unspecified reason; still has ovaries  . Bladder Cancer Maternal Uncle 79       not a smoker  . Kidney failure  Maternal Grandmother   . Congestive Heart Failure Maternal Grandmother   . Colon cancer Maternal Grandmother 73  . Diabetes Maternal Grandmother   . Lung cancer Maternal Grandfather 40       smoker  . Breast cancer Paternal Grandmother        dx. early 35s; w/ hx of trauma to breast  . Crohn's disease Daughter   . Lung cancer Maternal Uncle 62       smoker    SOCIAL HISTORY Social History   Tobacco Use  . Smoking status: Former Smoker    Packs/day: 1.00    Years: 10.00    Pack years: 10.00    Types: Cigarettes    Quit date:  06/08/2008    Years since quitting: 12.3  . Smokeless tobacco: Never Used  Vaping Use  . Vaping Use: Never used  Substance Use Topics  . Alcohol use: No  . Drug use: Never         OPHTHALMIC EXAM:  Base Eye Exam    Visual Acuity (Snellen - Linear)      Right Left   Dist cc 20/30 +2 HM   Dist ph cc 20/25 +1 NI   Correction: Glasses       Tonometry (Tonopen, 8:42 AM)      Right Left   Pressure 15 12       Pupils      Dark Light Shape React APD   Right 4 3 Round Brisk None   Left 5 5 Round dilated None       Visual Fields (Counting fingers)      Left Right     Full   Restrictions Partial outer superior temporal, inferior temporal, superior nasal, inferior nasal deficiencies        Extraocular Movement      Right Left    Full Full       Neuro/Psych    Oriented x3: Yes   Mood/Affect: Normal       Dilation    Both eyes: 1.0% Mydriacyl, 2.5% Phenylephrine @ 8:42 AM        Slit Lamp and Fundus Exam    External Exam      Right Left   External  Periorbital edema       Slit Lamp Exam      Right Left   Lids/Lashes Dermatochalasis - upper lid Dermatochalasis - upper lid   Conjunctiva/Sclera White and quiet Subconjunctival hemorrhage - improving, sutures intact   Cornea 1+ Punctate epithelial erosions, mild tear film debris, EBMD Epi defect - closed, 1+ Punctate epithelial erosions   Anterior Chamber Deep and quiet deep,  clear, narrow temporal angle   Iris Round and dilated Round and dilated, mild anterior bowing   Lens 1-2+ Nuclear sclerosis, 1-2+ Cortical cataract 1-2+ Nuclear sclerosis, 1-2+ Cortical cataract, 2+ PC feathering   Vitreous Vitreous syneresis, Posterior vitreous detachment, Weiss ring post vitrectomy       Fundus Exam      Right Left   Disc Pink and Sharp, temporal PPA Pink and Sharp, mild tilt, temporal PPA   C/D Ratio 0.3 0.4   Macula Flat, Blunted foveal reflex, RPE mottling, No heme or edema Hazy view, flat under gas   Vessels mild attenuation mild attenuation   Periphery Attached, small operculated hole at 0430, pigmented lattice degeneration inferiorly -- early laser changes surrounding hazy view, retina attached over buckle, Good buckle height, good laser over buckle and around tear, PRE-OP: Bullous inferior detachment from 0300-0800, Good early cryo changes, SRF extending to 0700; ORIGINALLY: Bullous RD from 0100-230, large HST at 0130; mild pavingstone degen inferiorly        Refraction    Wearing Rx      Sphere Cylinder Axis   Right -4.00 +0.75 100   Left -4.50 +0.75 100          IMAGING AND PROCEDURES  Imaging and Procedures for 10/11/2020           ASSESSMENT/PLAN:    ICD-10-CM   1. Left retinal detachment  H33.22   2. Retinal edema  H35.81   3. Retinal hole of right eye  H33.321   4. Diabetes mellitus type 2 without retinopathy ([place])  E11.9   5. Combined forms of age-related cataract of both eyes  H25.813     1,2. 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 - detached from 1 to 230 oclock w/ SRF still outside of ST arcades, large horseshoe tear at 130 - s/p pneumatic retinopexy OS (01.24.22) -- had progressive worsening of SRF, post-pneumatic  - s/p PPV/PFC/EL/FAX/14% C3F8 OS, 01.27.22             - did well this week             - retina attached and in good position under gas -- good buckle height and laser around  breaks             - IOP good at 12              - cont   Lotemax 4x/day OS -- switch to PF when Lotemax runs out                         oflaxacin QID OS -- stop on Saturday, 02.05.22                         Atropine BID OS -- okay to stop                         Brimonidine BID OS -- decrease to Qdaily                         Maxitrol ung QID OS -- switch to PSO Ung when Maxitrol runs out             - cont face down positioning 30 min/hr; avoid laying flat on back              - eye shield when sleeping x1 more week             - post op drop and positioning instructions reviewed  - tylenol/ibuprofen for pain  - f/u 2 weeks, DFE OU  3. Retinal hole, OD - small operculated hole at 0430 - s/p laser retinopexy OD (01.27.22) in OR -- early laser changes surrounding - completed Lotemax QID OD x7 days  4. Diabetes mellitus, type 2 without retinopathy - The incidence, risk factors for progression, natural history and treatment options for diabetic retinopathy  were discussed with patient.   - The need for close monitoring of blood glucose, blood pressure, and serum lipids, avoiding cigarette or any type of tobacco, and the need for long term follow up was also discussed with patient. - monitor  5. Mixed Cataract OU - The symptoms of cataract, surgical options, and treatments and risks were discussed with patient. - discussed diagnosis and progression - not yet visually significant - monitor for now   Ophthalmic Meds Ordered this visit:  No orders of the defined types were placed in this encounter.     Return in about 2 weeks (around 10/25/2020) for f/u RD OS, POV.  There are no Patient Instructions on file for this visit.   Explained the diagnoses, plan, and follow up with the patient and they expressed understanding.  Patient expressed understanding of the importance of proper follow up care.   This document serves as a record of services personally performed by Gardiner Sleeper,  MD, PhD. It was created on their behalf by Shirlean Mylar  Nyra Capes, an ophthalmic technician. The creation of this record is the provider's dictation and/or activities during the visit.    Electronically signed by: Leonie Douglas COA, 10/11/20  11:36 PM   This document serves as a record of services personally performed by Gardiner Sleeper, MD, PhD. It was created on their behalf by San Jetty. Owens Shark, OA an ophthalmic technician. The creation of this record is the provider's dictation and/or activities during the visit.    Electronically signed by: San Jetty. Marguerita Merles 02.03.2022 11:36 PM   Gardiner Sleeper, M.D., Ph.D. Diseases & Surgery of the Retina and Vitreous [facility]  I have reviewed the above documentation for accuracy and completeness, and I agree with the above. Gardiner Sleeper, M.D., Ph.D. 10/11/20 11:36 PM   Abbreviations: M myopia (nearsighted); A astigmatism; H hyperopia (farsighted); P presbyopia; Mrx spectacle prescription;  CTL contact lenses; OD right eye; OS left eye; OU both eyes  XT exotropia; ET esotropia; PEK punctate epithelial keratitis; PEE punctate epithelial erosions; DES dry eye syndrome; MGD meibomian gland dysfunction; ATs artificial tears; PFAT's preservative free artificial tears; [place] nuclear sclerotic cataract; PSC posterior subcapsular cataract; ERM epi-retinal membrane; PVD posterior vitreous detachment; RD retinal detachment; DM diabetes mellitus; DR diabetic retinopathy; NPDR non-proliferative diabetic retinopathy; PDR proliferative diabetic retinopathy; CSME clinically significant macular edema; DME diabetic macular edema; dbh dot blot hemorrhages; CWS cotton wool spot; POAG primary open angle glaucoma; C/D cup-to-disc ratio; HVF humphrey visual field; GVF goldmann visual field; OCT optical coherence tomography; IOP intraocular pressure; BRVO Branch retinal vein occlusion; CRVO central retinal vein occlusion; CRAO central retinal artery occlusion; BRAO  branch retinal artery occlusion; RT retinal tear; SB scleral buckle; PPV pars plana vitrectomy; VH Vitreous hemorrhage; PRP panretinal laser photocoagulation; IVK intravitreal kenalog; VMT vitreomacular traction; MH Macular hole;  NVD neovascularization of the disc; NVE neovascularization elsewhere; AREDS age related eye disease study; ARMD age related macular degeneration; POAG primary open angle glaucoma; EBMD epithelial/anterior basement membrane dystrophy; ACIOL anterior chamber intraocular lens; IOL intraocular lens; PCIOL posterior chamber intraocular lens; Phaco/IOL phacoemulsification with intraocular lens placement; [place] photorefractive keratectomy; LASIK laser assisted in situ keratomileusis; HTN hypertension; DM diabetes mellitus; COPD chronic obstructive pulmonary disease

## 2020-10-11 ENCOUNTER — Ambulatory Visit (INDEPENDENT_AMBULATORY_CARE_PROVIDER_SITE_OTHER): Payer: 59 | Admitting: Ophthalmology

## 2020-10-11 ENCOUNTER — Other Ambulatory Visit: Payer: Self-pay

## 2020-10-11 ENCOUNTER — Encounter (INDEPENDENT_AMBULATORY_CARE_PROVIDER_SITE_OTHER): Payer: Self-pay | Admitting: Ophthalmology

## 2020-10-11 DIAGNOSIS — H25813 Combined forms of age-related cataract, bilateral: Secondary | ICD-10-CM

## 2020-10-11 DIAGNOSIS — H3322 Serous retinal detachment, left eye: Secondary | ICD-10-CM

## 2020-10-11 DIAGNOSIS — H3581 Retinal edema: Secondary | ICD-10-CM

## 2020-10-11 DIAGNOSIS — H33321 Round hole, right eye: Secondary | ICD-10-CM

## 2020-10-11 DIAGNOSIS — E119 Type 2 diabetes mellitus without complications: Secondary | ICD-10-CM

## 2020-10-22 NOTE — Progress Notes (Signed)
[facility] Note  10/25/2020     CHIEF COMPLAINT Patient presents for Post-op Follow-up   HISTORY OF PRESENT ILLNESS: Christina [name] is a 55 y.o. female who presents to the clinic today for:   HPI    Post-op Follow-up    In left eye.  Discomfort includes itching.  I, the attending physician,  performed the HPI with the patient and updated documentation appropriately.          Comments    3 week post op RD repair OS- Eye is really itching her.  No pain in the eye.  The other day she had an irritation in it but that has improved. Using Prednisolone QID, Brimonidine qd, and Maxitrol ung QID OS.        Last edited by Bernarda Caffey, MD on 10/25/2020  8:47 AM. (History)    pt states she is doing at least 30 mins of face down time, she states her eye has been itching a lot   Referring physician: Maurice Small, MD [address],  [place] [address]  HISTORICAL INFORMATION:   Selected notes from the MEDICAL RECORD NUMBER Referred by Dr. Quentin Ore for RD OS LEE:  Ocular Hx- PMH-    CURRENT MEDICATIONS: Current Outpatient Medications (Ophthalmic Drugs)  Medication Sig  . bacitracin-polymyxin b (POLYSPORIN) ophthalmic ointment Place into the left eye at bedtime. Place a 1/2 inch ribbon of ointment into the lower eyelid at bedtime and as needed  . neomycin-polymyxin b-dexamethasone (MAXITROL) 3.5-10000-0.1 OINT Place 1 application into the left eye 4 (four) times daily.  . prednisoLONE acetate (PRED FORTE) 1 % ophthalmic suspension Place 1 drop into the left eye 4 (four) times daily.   No current facility-administered medications for this visit. (Ophthalmic Drugs)   Current Outpatient Medications (Other)  Medication Sig  . ALPRAZolam (XANAX) 0.25 MG tablet Take 0.25 mg by mouth daily as needed for anxiety.  Marland Kitchen amitriptyline (ELAVIL) 100 MG tablet Take 0.5 tablets (50 mg total) by mouth at bedtime.  Marland Kitchen FLUoxetine (PROZAC) 40 MG  capsule Take 40 mg by mouth daily.  Marland Kitchen HYDROcodone-acetaminophen (NORCO/VICODIN) 5-325 MG tablet Take 1 tablet by mouth every 4 (four) hours as needed for moderate pain.  . metFORMIN (GLUCOPHAGE) 500 MG tablet Take 1 tablet (500 mg total) by mouth 2 (two) times daily with a meal.  . topiramate (TOPAMAX) 50 MG tablet 1 po qhs  . vitamin B-12 (CYANOCOBALAMIN) 500 MCG tablet Take 1 tablet (500 mcg total) by mouth daily.  . Vitamin D, Ergocalciferol, (DRISDOL) 1.25 MG (50000 UNIT) CAPS capsule Take 1 capsule (50,000 Units total) by mouth every 7 (seven) days.   No current facility-administered medications for this visit. (Other)      REVIEW OF SYSTEMS: ROS    Positive for: Neurological, Eyes   Negative for: Constitutional, Gastrointestinal, Skin, Genitourinary, Musculoskeletal, HENT, Endocrine, Cardiovascular, Respiratory, Psychiatric, Allergic/Imm, Heme/Lymph   Last edited by Leonie Douglas, COA on 10/25/2020  8:01 AM. (History)       ALLERGIES No Known Allergies  PAST MEDICAL HISTORY Past Medical History:  Diagnosis Date  . Anemia   . Arthritis    hands and knees  . 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 Cataracts, bilateral   . Depression   . GAD (generalized anxiety  disorder)   . Gallbladder problem   . History of ovarian cyst   . IBS (irritable bowel syndrome)   . Joint pain   . PONV (postoperative nausea and vomiting)    does well with scop patch  . Pre-diabetes    followed by pcp; on metformin for weight loss -Dr Mellody Dance  . Retinal detachment    Rheg OS  . Right knee meniscal tear   . Urgency of urination    Past Surgical History:  Procedure Laterality Date  . ABDOMINAL HYSTERECTOMY  05/1996   endometriosis  . ACHILLES TENDON REPAIR Right 2010;  revision 2011  . BLADDER SUSPENSION  2000  . BREAST BIOPSY  Left 10/2015  . BREAST IMPLANT REMOVAL Bilateral 08/12/2017   Procedure: REMOVAL BILATERAL BREAST IMPLANTS;  Surgeon: Wallace Going, DO;  Location: [facility];  Service: Plastics;  Laterality: Bilateral;  . BREAST RECONSTRUCTION WITH PLACEMENT OF TISSUE EXPANDER AND FLEX HD (ACELLULAR HYDRATED DERMIS) Bilateral 01/09/2016  . BREAST RECONSTRUCTION WITH PLACEMENT OF TISSUE EXPANDER AND FLEX HD (ACELLULAR HYDRATED DERMIS) Bilateral 01/09/2016   Procedure: BREAST RECONSTRUCTION WITH PLACEMENT OF TISSUE EXPANDER AND FLEX HD (ACELLULAR HYDRATED DERMIS);  Surgeon: Loel Lofty Dillingham, DO;  Location: [facility];  Service: Plastics;  Laterality: Bilateral;  . BREAST RECONSTRUCTION WITH PLACEMENT OF TISSUE EXPANDER AND FLEX HD (ACELLULAR HYDRATED DERMIS) Bilateral 05/29/2016   Procedure: PLACEMENT OF BILATERAL TISSUE EXPANDER AND FLEX HD (ACELLULAR HYDRATED DERMIS);  Surgeon: Wallace Going, DO;  Location: [facility];  Service: Plastics;  Laterality: Bilateral;  . BREAST REDUCTION SURGERY Bilateral 11/26/2016   Procedure: BILATERAL BREAST CAPSULE CONTRACTURE RELASE;  Surgeon: Wallace Going, DO;  Location: [facility];  Service: Plastics;  Laterality: Bilateral;  . [place]; 1989; 1991  . ELBOW SURGERY Right x3   last one 02-01-2019 @ [facility]  . EYE SURGERY Left 10/01/2020   Pneumatic retinopexy for repair of rheg RD - Dr. Bernarda Caffey  . EYE SURGERY Left 10/04/2020   PPV - Dr. Bernarda Caffey  . FAT GRAFTING BILATERAL BREAST  08-09-2018  _0   . GAS INSERTION Left 10/04/2020   Procedure: INSERTION OF GAS;  Surgeon: Bernarda Caffey, MD;  Location: [facility];  Service: Ophthalmology;  Laterality: Left;  Marland Kitchen GAS/FLUID EXCHANGE Left 10/04/2020   Procedure: GAS/FLUID EXCHANGE;  Surgeon: Bernarda Caffey, MD;  Location: [facility];  Service: Ophthalmology;  Laterality: Left;  . INCISION AND DRAINAGE OF WOUND Bilateral 02/11/2016   Procedure: IRRIGATION AND DEBRIDEMENT OF  BILATERAL BREAST POCKET;  Surgeon: Wallace Going, DO;  Location: [facility];  Service: Plastics;  Laterality: Bilateral;  . KNEE ARTHROSCOPY WITH MEDIAL MENISECTOMY Right 12/20/2019   Procedure: KNEE ARTHROSCOPY WITH MEDIAL MENISECTOMY;  Surgeon: Marchia Bond, MD;  Location: [facility];  Service: Orthopedics;  Laterality: Right;  . LAPAROSCOPIC APPENDECTOMY  04-07-2011   _1    w/ Excision peritoneal lipoma and lysis adhesions  . LAPAROSCOPIC CHOLECYSTECTOMY  ~ 1999  . LASER PHOTO ABLATION Right 10/04/2020   Procedure: LASER RETINOPEXY WITH INDIRECT LASER OPTHALMOSCOPE, RIGHT EYE;  Surgeon: Bernarda Caffey, MD;  Location: [facility];  Service: Ophthalmology;  Laterality: Right;  . LIPOSUCTION WITH LIPOFILLING Bilateral 11/26/2016   Procedure: LIPOFILLING FOR SYMMETRY;  Surgeon: Wallace Going, DO;  Location: [facility];  Service: Plastics;  Laterality: Bilateral;  . LIPOSUCTION WITH LIPOFILLING Bilateral 01/21/2017   Procedure: BILATERAL BREAST  LIPOFILLING FOR ASYMMETRY;  Surgeon: Wallace Going, DO;  Location: [facility];  Service: Plastics;  Laterality: Bilateral;  . LIPOSUCTION WITH LIPOFILLING Bilateral 06/28/2020   Procedure: Lipofilling bilateral breasts for asymmetry;  Surgeon: Wallace Going, DO;  Location: [facility];  Service: Plastics;  Laterality: Bilateral;  90 min  . MASTECTOMY Bilateral 01/09/2016  . NIPPLE SPARING MASTECTOMY/SENTINAL LYMPH NODE BIOPSY/RECONSTRUCTION/PLACEMENT OF TISSUE EXPANDER Bilateral 01/09/2016   Procedure: BILATERAL NIPPLE SPARING MASTECTOMY WITH LEFT SENTINAL LYMPH NODE BIOPSY ;  Surgeon: Stark Klein, MD;  Location: [facility];  Service: General;  Laterality: Bilateral;  . PHOTOCOAGULATION WITH LASER Left 10/04/2020   Procedure: PHOTOCOAGULATION WITH LASER;  Surgeon: Bernarda Caffey, MD;  Location: [facility];  Service: Ophthalmology;  Laterality: Left;  . REMOVAL OF BILATERAL TISSUE  EXPANDERS WITH PLACEMENT OF BILATERAL BREAST IMPLANTS Bilateral 08/20/2016   Procedure: REMOVAL OF BILATERAL TISSUE EXPANDERS WITH PLACEMENT OF BILATERAL SILICONE IMPLANTS;  Surgeon: Wallace Going, DO;  Location: [facility];  Service: Plastics;  Laterality: Bilateral;  . REMOVAL OF BILATERAL TISSUE EXPANDERS WITH PLACEMENT OF BILATERAL BREAST IMPLANTS Bilateral 11/05/2017   Procedure: REMOVAL OF BILATERAL TISSUE EXPANDERS WITH PLACEMENT OF BILATERAL BREAST SILICONE IMPLANTS;  Surgeon: Wallace Going, DO;  Location: [facility];  Service: Plastics;  Laterality: Bilateral;  . REMOVAL OF TISSUE EXPANDER Bilateral 02/11/2016   Procedure: REMOVAL OF BILATERAL TISSUE EXPANDERS AND FLEX HD REMOVAL;  Surgeon: Wallace Going, DO;  Location: [facility];  Service: Plastics;  Laterality: Bilateral;  . RETINAL DETACHMENT SURGERY Left 10/01/2020   Pneumatic retinopexy for repair of rheg RD - Dr. Bernarda Caffey  . RETINAL DETACHMENT SURGERY Left 10/04/2020   PPV - Dr. Bernarda Caffey  . SCLERAL BUCKLE Left 10/04/2020   Procedure: SCLERAL BUCKLE LEFT EYE;  Surgeon: Bernarda Caffey, MD;  Location: [facility];  Service: Ophthalmology;  Laterality: Left;  . TISSUE EXPANDER PLACEMENT Bilateral 08/12/2017   Procedure: PLACEMENT OF BILATERAL TISSUE EXPANDER;  Surgeon: Wallace Going, DO;  Location: [facility];  Service: Plastics;  Laterality: Bilateral;  . TUBAL LIGATION Bilateral 1991  . VITRECTOMY 25 GAUGE WITH SCLERAL BUCKLE Left 10/04/2020   Procedure: 25 GAUGE PARS PLANA VITRECTOMY LEFT EYE ;  Surgeon: Bernarda Caffey, MD;  Location: [facility];  Service: Ophthalmology;  Laterality: Left;    FAMILY HISTORY Family History  Problem Relation Age of Onset  . Heart failure Father   . Prostate cancer Father 74  . Retinal detachment Father   . Colon polyps Mother        approx 2  . Other Mother        hx HPV and hysterectomy due to precancerous cells  .  Depression Mother   . Anxiety disorder Mother   . Other Sister 82       hx of hysterectomy for unspecified reason; still has ovaries  . Other Sister 65       paternal half-sister hx of hysterectomy for unspecified reason; still has ovaries  . Bladder Cancer Maternal Uncle 79       not a smoker  . Kidney failure Maternal Grandmother   . Congestive Heart Failure Maternal Grandmother   . Colon cancer Maternal Grandmother 41  . Diabetes Maternal Grandmother   . Lung cancer Maternal Grandfather 14       smoker  . Breast cancer Paternal Grandmother        dx. early 18s; w/ hx of trauma to breast  . Crohn's disease Daughter   . Lung  cancer Maternal Uncle 79       smoker    SOCIAL HISTORY Social History   Tobacco Use  . Smoking status: Former Smoker    Packs/day: 1.00    Years: 10.00    Pack years: 10.00    Types: Cigarettes    Quit date: 06/08/2008    Years since quitting: 12.3  . Smokeless tobacco: Never Used  Vaping Use  . Vaping Use: Never used  Substance Use Topics  . Alcohol use: No  . Drug use: Never         OPHTHALMIC EXAM:  Base Eye Exam    Visual Acuity (Snellen - Linear)      Right Left   Dist cc 20/25 HM   Dist ph cc  NI       Tonometry (Tonopen, 8:05 AM)      Right Left   Pressure def 11       Pupils      Dark Light Shape React APD   Right 4 3 Round Brisk None   Left 5 4.5 Round Minimal None       Neuro/Psych    Oriented x3: Yes   Mood/Affect: Normal       Dilation    Left eye: 1.0% Mydriacyl, 2.5% Phenylephrine @ 8:05 AM        Slit Lamp and Fundus Exam    External Exam      Right Left   External  Periorbital edema       Slit Lamp Exam      Right Left   Lids/Lashes Dermatochalasis - upper lid Dermatochalasis - upper lid   Conjunctiva/Sclera White and quiet Subconjunctival hemorrhage - improved, sutures intact   Cornea 1+ Punctate epithelial erosions, mild tear film debris, EBMD 1-2+ Punctate epithelial erosions   Anterior  Chamber deep, clear, narrow angles deep, clear, narrow temporal angle   Iris Round and reactive Round and dilated, mild anterior bowing   Lens 1-2+ Nuclear sclerosis, 1-2+ Cortical cataract 1-2+ Nuclear sclerosis, 1-2+ Cortical cataract, 2+ PC feathering / PSC   Vitreous Vitreous syneresis, Posterior vitreous detachment, Weiss ring post vitrectomy, 80% gas bubble       Fundus Exam      Right Left   Disc  Pink and Sharp, mild tilt, temporal PPA   C/D Ratio 0.3 0.4   Macula  Hazy view, flat under gas   Vessels  mild attenuation   Periphery  hazy view, retina attached over buckle, Good buckle height, good laser over buckle and around tear, PRE-OP: Bullous inferior detachment from 0300-0800, Good early cryo changes, SRF extending to 0700; ORIGINALLY: Bullous RD from 0100-230, large HST at 0130; mild pavingstone degen inferiorly          IMAGING AND PROCEDURES  Imaging and Procedures for 10/25/2020           ASSESSMENT/PLAN:    ICD-10-CM   1. Left retinal detachment  H33.22   2. Retinal edema  H35.81 CANCELED: OCT, Retina - OU - Both Eyes  3. Retinal hole of right eye  H33.321   4. Diabetes mellitus type 2 without retinopathy ([place])  E11.9   5. Combined forms of age-related cataract of both eyes  H25.813     1,2. 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 - detached from 1 to 230 oclock w/ SRF still outside of ST arcades, large horseshoe tear at 130 - s/p pneumatic retinopexy OS (01.24.22) -- had progressive worsening of SRF,  post-pneumatic  - s/p scleral buckle + PPV/PFC/EL/FAX/14% C3F8 OS, 01.27.22             - did well this week             - retina attached and in good position under gas -- good buckle height and laser around breaks             - IOP good at 11  - gas bubble 80%             - cont  PF 4x/day OS                         Brimonidine BID OS -- okay to stop                         PSO ung QID OS -- PRN/bedtime              - cont face down positioning 30 min/hr; avoid laying flat on back              - post op drop and positioning instructions reviewed  - tylenol/ibuprofen for pain  - f/u 3 weeks, DFE OU, OCT  3. Retinal hole, OD - small operculated hole at 0430 - s/p laser retinopexy OD (01.27.22) in OR  4. Diabetes mellitus, type 2 without retinopathy - The incidence, risk factors for progression, natural history and treatment options for diabetic retinopathy  were discussed with patient.   - The need for close monitoring of blood glucose, blood pressure, and serum lipids, avoiding cigarette or any type of tobacco, and the need for long term follow up was also discussed with patient. - monitor  5. Mixed Cataract OU - The symptoms of cataract, surgical options, and treatments and risks were discussed with patient. - discussed diagnosis and progression - not yet visually significant - monitor for now   Ophthalmic Meds Ordered this visit:  Meds ordered this encounter  Medications  . bacitracin-polymyxin b (POLYSPORIN) ophthalmic ointment    Sig: Place into the left eye at bedtime. Place a 1/2 inch ribbon of ointment into the lower eyelid at bedtime and as needed    Dispense:  3.5 g    Refill:  3  . prednisoLONE acetate (PRED FORTE) 1 % ophthalmic suspension    Sig: Place 1 drop into the left eye 4 (four) times daily.    Dispense:  15 mL    Refill:  0      Return in about 3 weeks (around 11/15/2020) for f/u RD OS, DFE, OCT.  There are no Patient Instructions on file for this visit.   Explained the diagnoses, plan, and follow up with the patient and they expressed understanding.  Patient expressed understanding of the importance of proper follow up care.   This document serves as a record of services personally performed by Gardiner Sleeper, MD, PhD. It was created on their behalf by Leonie Douglas, an ophthalmic technician. The creation of this record is the provider's dictation and/or activities  during the visit.    Electronically signed by: Leonie Douglas COA, 10/25/20  8:52 AM   This document serves as a record of services personally performed by Gardiner Sleeper, MD, PhD. It was created on their behalf by San Jetty. Owens Shark, OA an ophthalmic technician. The creation of this record is the provider's dictation and/or activities during the visit.    Electronically  signed by: San Jetty. Owens Shark, [place] 02.17.2022 8:52 AM  Gardiner Sleeper, M.D., Ph.D. Diseases & Surgery of the Retina and Vitreous [facility]  I have reviewed the above documentation for accuracy and completeness, and I agree with the above. Gardiner Sleeper, M.D., Ph.D. 10/25/20 8:52 AM   Abbreviations: M myopia (nearsighted); A astigmatism; H hyperopia (farsighted); P presbyopia; Mrx spectacle prescription;  CTL contact lenses; OD right eye; OS left eye; OU both eyes  XT exotropia; ET esotropia; PEK punctate epithelial keratitis; PEE punctate epithelial erosions; DES dry eye syndrome; MGD meibomian gland dysfunction; ATs artificial tears; PFAT's preservative free artificial tears; [place] nuclear sclerotic cataract; PSC posterior subcapsular cataract; ERM epi-retinal membrane; PVD posterior vitreous detachment; RD retinal detachment; DM diabetes mellitus; DR diabetic retinopathy; NPDR non-proliferative diabetic retinopathy; PDR proliferative diabetic retinopathy; CSME clinically significant macular edema; DME diabetic macular edema; dbh dot blot hemorrhages; CWS cotton wool spot; POAG primary open angle glaucoma; C/D cup-to-disc ratio; HVF humphrey visual field; GVF goldmann visual field; OCT optical coherence tomography; IOP intraocular pressure; BRVO Branch retinal vein occlusion; CRVO central retinal vein occlusion; CRAO central retinal artery occlusion; BRAO branch retinal artery occlusion; RT retinal tear; SB scleral buckle; PPV pars plana vitrectomy; VH Vitreous hemorrhage; PRP panretinal laser photocoagulation; IVK  intravitreal kenalog; VMT vitreomacular traction; MH Macular hole;  NVD neovascularization of the disc; NVE neovascularization elsewhere; AREDS age related eye disease study; ARMD age related macular degeneration; POAG primary open angle glaucoma; EBMD epithelial/anterior basement membrane dystrophy; ACIOL anterior chamber intraocular lens; IOL intraocular lens; PCIOL posterior chamber intraocular lens; Phaco/IOL phacoemulsification with intraocular lens placement; [place] photorefractive keratectomy; LASIK laser assisted in situ keratomileusis; HTN hypertension; DM diabetes mellitus; COPD chronic obstructive pulmonary disease

## 2020-10-23 ENCOUNTER — Other Ambulatory Visit: Payer: Self-pay

## 2020-10-23 ENCOUNTER — Telehealth (INDEPENDENT_AMBULATORY_CARE_PROVIDER_SITE_OTHER): Payer: 59 | Admitting: Family Medicine

## 2020-10-23 ENCOUNTER — Encounter (INDEPENDENT_AMBULATORY_CARE_PROVIDER_SITE_OTHER): Payer: Self-pay | Admitting: Family Medicine

## 2020-10-23 DIAGNOSIS — E559 Vitamin D deficiency, unspecified: Secondary | ICD-10-CM

## 2020-10-23 DIAGNOSIS — Z9189 Other specified personal risk factors, not elsewhere classified: Secondary | ICD-10-CM

## 2020-10-23 DIAGNOSIS — E669 Obesity, unspecified: Secondary | ICD-10-CM

## 2020-10-23 DIAGNOSIS — E538 Deficiency of other specified B group vitamins: Secondary | ICD-10-CM | POA: Diagnosis not present

## 2020-10-23 DIAGNOSIS — F39 Unspecified mood [affective] disorder: Secondary | ICD-10-CM | POA: Diagnosis not present

## 2020-10-23 DIAGNOSIS — Z6830 Body mass index (BMI) 30.0-30.9, adult: Secondary | ICD-10-CM

## 2020-10-23 DIAGNOSIS — R7303 Prediabetes: Secondary | ICD-10-CM

## 2020-10-23 MED ORDER — VITAMIN D (ERGOCALCIFEROL) 1.25 MG (50000 UNIT) PO CAPS: 50000.0000 [IU] | ORAL_CAPSULE | ORAL | 0 refills | Status: DC

## 2020-10-23 MED ORDER — TOPIRAMATE 50 MG PO TABS: ORAL_TABLET | ORAL | 0 refills | Status: DC

## 2020-10-23 MED ORDER — METFORMIN HCL 500 MG PO TABS: 500.0000 mg | ORAL_TABLET | Freq: Two times a day (BID) | ORAL | 0 refills | Status: DC

## 2020-10-25 ENCOUNTER — Encounter (INDEPENDENT_AMBULATORY_CARE_PROVIDER_SITE_OTHER): Payer: Self-pay | Admitting: Ophthalmology

## 2020-10-25 ENCOUNTER — Other Ambulatory Visit: Payer: Self-pay

## 2020-10-25 ENCOUNTER — Ambulatory Visit (INDEPENDENT_AMBULATORY_CARE_PROVIDER_SITE_OTHER): Payer: 59 | Admitting: Ophthalmology

## 2020-10-25 DIAGNOSIS — E119 Type 2 diabetes mellitus without complications: Secondary | ICD-10-CM

## 2020-10-25 DIAGNOSIS — H33321 Round hole, right eye: Secondary | ICD-10-CM

## 2020-10-25 DIAGNOSIS — H3581 Retinal edema: Secondary | ICD-10-CM

## 2020-10-25 DIAGNOSIS — H3322 Serous retinal detachment, left eye: Secondary | ICD-10-CM

## 2020-10-25 DIAGNOSIS — H25813 Combined forms of age-related cataract, bilateral: Secondary | ICD-10-CM

## 2020-10-25 MED ORDER — PREDNISOLONE ACETATE 1 % OP SUSP: 1.0000 [drp] | Freq: Four times a day (QID) | OPHTHALMIC | 0 refills | Status: DC

## 2020-10-25 MED ORDER — BACITRACIN-POLYMYXIN B 500-10000 UNIT/GM OP OINT: TOPICAL_OINTMENT | Freq: Every evening | OPHTHALMIC | 3 refills | Status: DC

## 2020-10-30 NOTE — Progress Notes (Signed)
TeleHealth Visit:  Due to the COVID-19 pandemic, this visit was completed with telemedicine (audio/video) technology to reduce patient and provider exposure as well as to preserve personal protective equipment.   Christina [name] has verbally consented to this TeleHealth visit. The patient is located at home, the provider is located at the [facility] office. The participants in this visit include the listed provider and patient and. The visit was conducted today via [place].  OBESITY Christina [name] is here to discuss her progress with her obesity treatment plan along with follow-up of her obesity related diagnoses.   Today's visit was #: 9 Starting weight: 218 lbs Starting date: 05/15/2020 Today's date: 10/23/2020  Interim History:  Christina [name] is at home and not working due to retinal detachment symptoms. More challenging to stay on plan when not in her routine.  She does not think she gained weight though.  Denies issues with the plan.  Cravings and hunger are well controlled.  Current Meal Plan: the Category 3 Plan for 75% of the time.  Current Exercise Plan: None at this time.  This patient is following the prescribed meal plan meal without concerns.  Food recall appears to be consistent with the prescribed plan.  When following the plan, hunger and cravings are well controlled.    Assessment/Plan:   1. Prediabetes Not at goal. Goal is HgbA1c < 5.7.  Medication: metformin 500 mg twice daily.    Plan:  Will refill metformin today, as per below.  Will check fasting labs in the near future.  See orders below.  She will continue to focus on protein-rich, low simple carbohydrate foods. We reviewed the importance of hydration, regular exercise for stress reduction, and restorative sleep.   Lab Results  Component Value Date   HGBA1C 6.3 (H) 05/15/2020   Lab Results  Component Value Date   INSULIN 34.7 (H) 05/15/2020   - Refill metFORMIN (GLUCOPHAGE) 500 MG tablet; Take 1 tablet (500 mg  total) by mouth 2 (two) times daily with a meal.  Dispense: 60 tablet; Refill: 0 - Insulin, random - Hemoglobin A1c  2. Vitamin D deficiency Not at goal. Current vitamin D is 21.2, tested on 05/15/2020. Optimal goal > 50 ng/dL.   Plan: Continue to take prescription Vitamin D @50 ,000 IU every week as prescribed.  Will refill vitamin D today and will check vitamin D level at next office visit.    - Refill Vitamin D, Ergocalciferol, (DRISDOL) 1.25 MG (50000 UNIT) CAPS capsule; Take 1 capsule (50,000 Units total) by mouth every 7 (seven) days.  Dispense: 4 capsule; Refill: 0 - VITAMIN D 25 Hydroxy (Vit-D Deficiency, Fractures)  3. Vitamin B12 deficiency Lab Results  Component Value Date   VITAMINB12 211 (L) 05/15/2020   No symptoms or concerns.  She takes OTC vitamin B12 500 mcg daily.  Plan:  Will check B12 level at next office visit.  Energy levels are much better than prior.  Continue OTC supplement at current dose.   - Vitamin B12  4. Mood disorder ([place]), with emotional eating Started Topamax at last office visit.  Tolerating well with less cravings, especially in the afternoons and evenings.  She says her mood is "doing great".  Denies concerns.  She is taking Prozac 40 mg daily and Topamax 50 mg at bedtime.  Plan:  Will refill Topamax today, as per below.  Continue Prozac at current dose.  - Refill topiramate (TOPAMAX) 50 MG tablet; 1 po qhs  Dispense: 30 tablet; Refill: 0  5. Class 1 obesity with serious comorbidity and body mass index (BMI) of 30.0 to 30.9 in adult, unspecified obesity type  Course: Christina [name] is currently in the action stage of change. As such, her goal is to continue with weight loss efforts.   Nutrition goals: She has agreed to the Category 3 Plan.   Exercise goals: Restrictions per Ophthalmology.   Behavioral modification strategies: meal planning and cooking strategies, ways to avoid boredom eating, emotional eating strategies and planning for  success.  Christina [name] has agreed to follow-up with our clinic in 2 weeks.  She will come fasting if possible.  She was informed of the importance of frequent follow-up visits to maximize her success with intensive lifestyle modifications for her multiple health conditions.   Objective:   General: Cooperative, alert, well developed, in no acute distress. HEENT: Conjunctivae and lids unremarkable. Cardiovascular: Regular rhythm.  Lungs: Normal work of breathing. Neurologic: No focal deficits.   Lab Results  Component Value Date   CREATININE 0.71 05/15/2020   BUN 10 05/15/2020   NA 138 05/15/2020   K 4.7 05/15/2020   CL 105 05/15/2020   CO2 23 05/15/2020   Lab Results  Component Value Date   ALT 34 (H) 05/15/2020   AST 23 05/15/2020   ALKPHOS 52 05/15/2020   BILITOT 0.3 05/15/2020   Lab Results  Component Value Date   HGBA1C 6.3 (H) 05/15/2020   Lab Results  Component Value Date   INSULIN 34.7 (H) 05/15/2020   Lab Results  Component Value Date   TSH 1.730 05/15/2020   Lab Results  Component Value Date   CHOL 125 05/15/2020   HDL 36 (L) 05/15/2020   LDLCALC 71 05/15/2020   TRIG 95 05/15/2020   Lab Results  Component Value Date   WBC 6.8 05/15/2020   HGB 13.8 05/15/2020   HCT 42.0 05/15/2020   MCV 87 05/15/2020   PLT 308 05/15/2020   Attestation Statements:   Reviewed by clinician on day of visit: allergies, medications, problem list, medical history, surgical history, family history, social history, and previous encounter notes.  I, Water quality scientist, CMA, am acting as Location manager for [employer], DO.  I have reviewed the above documentation for accuracy and completeness, and I agree with the above. Christina [name], D.O.  The [place] was signed into law in 2016 which includes the topic of electronic health records.  This provides immediate access to information in MyChart.  This includes consultation notes, operative notes, office notes,  lab results and pathology reports.  If you have any questions about what you read please let us know at your next visit so we can discuss your concerns and take corrective action if need be.  We are right here with you.

## 2020-11-12 ENCOUNTER — Encounter (INDEPENDENT_AMBULATORY_CARE_PROVIDER_SITE_OTHER): Payer: Self-pay | Admitting: Family Medicine

## 2020-11-12 ENCOUNTER — Ambulatory Visit (INDEPENDENT_AMBULATORY_CARE_PROVIDER_SITE_OTHER): Payer: 59 | Admitting: Family Medicine

## 2020-11-12 ENCOUNTER — Other Ambulatory Visit: Payer: Self-pay

## 2020-11-12 VITALS — BP 138/80 | HR 71 | Temp 98.0°F | Ht 68.0 in | Wt 206.0 lb

## 2020-11-12 DIAGNOSIS — E559 Vitamin D deficiency, unspecified: Secondary | ICD-10-CM | POA: Diagnosis not present

## 2020-11-12 DIAGNOSIS — R7303 Prediabetes: Secondary | ICD-10-CM | POA: Diagnosis not present

## 2020-11-12 DIAGNOSIS — Z9189 Other specified personal risk factors, not elsewhere classified: Secondary | ICD-10-CM

## 2020-11-12 DIAGNOSIS — E669 Obesity, unspecified: Secondary | ICD-10-CM | POA: Diagnosis not present

## 2020-11-12 DIAGNOSIS — F39 Unspecified mood [affective] disorder: Secondary | ICD-10-CM | POA: Diagnosis not present

## 2020-11-12 DIAGNOSIS — Z6831 Body mass index (BMI) 31.0-31.9, adult: Secondary | ICD-10-CM

## 2020-11-12 MED ORDER — METFORMIN HCL 500 MG PO TABS
500.0000 mg | ORAL_TABLET | Freq: Two times a day (BID) | ORAL | 0 refills | Status: DC
Start: 2020-11-12 — End: 2020-12-03

## 2020-11-12 MED ORDER — VITAMIN D (ERGOCALCIFEROL) 1.25 MG (50000 UNIT) PO CAPS: 50000.0000 [IU] | ORAL_CAPSULE | ORAL | 0 refills | Status: DC

## 2020-11-12 MED ORDER — TOPIRAMATE 50 MG PO TABS: ORAL_TABLET | ORAL | 0 refills | Status: DC

## 2020-11-12 NOTE — Progress Notes (Addendum)
[facility] Note  11/15/2020     CHIEF COMPLAINT Patient presents for Post-op Follow-up   HISTORY OF PRESENT ILLNESS: Christina [name] is a 55 y.o. female who presents to the clinic today for:  HPI    Post-op Follow-up    In left eye.  Discomfort includes Negative for pain, itching, foreign body sensation, tearing, discharge, floaters and none.  Vision is improved.  I, the attending physician,  performed the HPI with the patient and updated documentation appropriately.          Comments    Patient states vision improving OS. No eye pain. Using Pred Forte qid OS and polytrim ung qhs/prn OS.       Last edited by Bernarda Caffey, MD on 11/15/2020  9:48 AM. (History)    Pt states the gas bubble is starting to shrink and her vision is improving, she is only using PF QID OS   Referring physician: Hortencia Pilar, MD [place],  [place] [id number]  HISTORICAL INFORMATION:    Selected notes from the MEDICAL RECORD NUMBER Referred by Dr. Quentin Ore for RD OS   CURRENT MEDICATIONS: Current Outpatient Medications (Ophthalmic Drugs)  Medication Sig  . bacitracin-polymyxin b (POLYSPORIN) ophthalmic ointment Place into the left eye at bedtime. Place a 1/2 inch ribbon of ointment into the lower eyelid at bedtime and as needed  . prednisoLONE acetate (PRED FORTE) 1 % ophthalmic suspension Place 1 drop into the left eye 4 (four) times daily.  Marland Kitchen neomycin-polymyxin b-dexamethasone (MAXITROL) 3.5-10000-0.1 OINT Place 1 application into the left eye 4 (four) times daily. (Patient not taking: Reported on 11/15/2020)   No current facility-administered medications for this visit. (Ophthalmic Drugs)   Current Outpatient Medications (Other)  Medication Sig  . ALPRAZolam (XANAX) 0.25 MG tablet Take 0.25 mg by mouth daily as needed for anxiety.  Marland Kitchen amitriptyline (ELAVIL) 100 MG tablet Take 0.5 tablets (50 mg total) by mouth at bedtime.  Marland Kitchen FLUoxetine  (PROZAC) 40 MG capsule Take 40 mg by mouth daily.  Marland Kitchen HYDROcodone-acetaminophen (NORCO/VICODIN) 5-325 MG tablet Take 1 tablet by mouth every 4 (four) hours as needed for moderate pain.  . metFORMIN (GLUCOPHAGE) 500 MG tablet Take 1 tablet (500 mg total) by mouth 2 (two) times daily with a meal.  . topiramate (TOPAMAX) 50 MG tablet 1 po twice daily  . vitamin B-12 (CYANOCOBALAMIN) 500 MCG tablet Take 1 tablet (500 mcg total) by mouth daily.  . Vitamin D, Ergocalciferol, (DRISDOL) 1.25 MG (50000 UNIT) CAPS capsule Take 1 capsule (50,000 Units total) by mouth every 7 (seven) days.   No current facility-administered medications for this visit. (Other)      REVIEW OF SYSTEMS: ROS    Positive for: Neurological, Eyes   Negative for: Constitutional, Gastrointestinal, Skin, Genitourinary, Musculoskeletal, HENT, Endocrine, Cardiovascular, Respiratory, Psychiatric, Allergic/Imm, Heme/Lymph   Last edited by Roselee Nova D, COT on 11/15/2020  8:02 AM. (History)       ALLERGIES No Known Allergies  PAST MEDICAL HISTORY Past Medical History:  Diagnosis Date  . Anemia   . Arthritis    hands and knees  . 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 Cataracts, bilateral   . Depression   . GAD (generalized anxiety disorder)   . Gallbladder problem   .  History of ovarian cyst   . IBS (irritable bowel syndrome)   . Joint pain   . PONV (postoperative nausea and vomiting)    does well with scop patch  . Pre-diabetes    followed by pcp; on metformin for weight loss -Dr Mellody Dance  . Retinal detachment    Rheg OS  . Right knee meniscal tear   . Urgency of urination    Past Surgical History:  Procedure Laterality Date  . ABDOMINAL HYSTERECTOMY  05/1996   endometriosis  . ACHILLES TENDON REPAIR Right 2010;  revision 2011  . BLADDER  SUSPENSION  2000  . BREAST BIOPSY Left 10/2015  . BREAST IMPLANT REMOVAL Bilateral 08/12/2017   Procedure: REMOVAL BILATERAL BREAST IMPLANTS;  Surgeon: [place] Going, DO;  Location: [place];  Service: Plastics;  Laterality: Bilateral;  . BREAST RECONSTRUCTION WITH PLACEMENT OF TISSUE EXPANDER AND FLEX HD (ACELLULAR HYDRATED DERMIS) Bilateral 01/09/2016  . BREAST RECONSTRUCTION WITH PLACEMENT OF TISSUE EXPANDER AND FLEX HD (ACELLULAR HYDRATED DERMIS) Bilateral 01/09/2016   Procedure: BREAST RECONSTRUCTION WITH PLACEMENT OF TISSUE EXPANDER AND FLEX HD (ACELLULAR HYDRATED DERMIS);  Surgeon: Loel Lofty Dillingham, DO;  Location: [place];  Service: Plastics;  Laterality: Bilateral;  . BREAST RECONSTRUCTION WITH PLACEMENT OF TISSUE EXPANDER AND FLEX HD (ACELLULAR HYDRATED DERMIS) Bilateral 05/29/2016   Procedure: PLACEMENT OF BILATERAL TISSUE EXPANDER AND FLEX HD (ACELLULAR HYDRATED DERMIS);  Surgeon: [place] Going, DO;  Location: [place];  Service: Plastics;  Laterality: Bilateral;  . BREAST REDUCTION SURGERY Bilateral 11/26/2016   Procedure: BILATERAL BREAST CAPSULE CONTRACTURE RELASE;  Surgeon: [place] Going, DO;  Location: [place];  Service: Plastics;  Laterality: Bilateral;  . [place]; 1989; 1991  . ELBOW SURGERY Right x3   last one 02-01-2019 @ [facility]  . EYE SURGERY Left 10/01/2020   Pneumatic retinopexy for repair of rheg RD - Dr. Bernarda Caffey  . EYE SURGERY Left 10/04/2020   PPV - Dr. Bernarda Caffey  . FAT GRAFTING BILATERAL BREAST  08-09-2018  _0   . GAS INSERTION Left 10/04/2020   Procedure: INSERTION OF GAS;  Surgeon: Bernarda Caffey, MD;  Location: [place];  Service: Ophthalmology;  Laterality: Left;  Marland Kitchen GAS/FLUID EXCHANGE Left 10/04/2020   Procedure: GAS/FLUID EXCHANGE;  Surgeon: Bernarda Caffey, MD;  Location: [place];  Service: Ophthalmology;  Laterality: Left;  . INCISION AND DRAINAGE OF WOUND Bilateral 02/11/2016    Procedure: IRRIGATION AND DEBRIDEMENT OF BILATERAL BREAST POCKET;  Surgeon: [place] Going, DO;  Location: [place];  Service: Plastics;  Laterality: Bilateral;  . KNEE ARTHROSCOPY WITH MEDIAL MENISECTOMY Right 12/20/2019   Procedure: KNEE ARTHROSCOPY WITH MEDIAL MENISECTOMY;  Surgeon: Marchia Bond, MD;  Location: [place];  Service: Orthopedics;  Laterality: Right;  . LAPAROSCOPIC APPENDECTOMY  04-07-2011   _1    w/ Excision peritoneal lipoma and lysis adhesions  . LAPAROSCOPIC CHOLECYSTECTOMY  ~ 1999  . LASER PHOTO ABLATION Right 10/04/2020   Procedure: LASER RETINOPEXY WITH INDIRECT LASER OPTHALMOSCOPE, RIGHT EYE;  Surgeon: Bernarda Caffey, MD;  Location: [place];  Service: Ophthalmology;  Laterality: Right;  . LIPOSUCTION WITH LIPOFILLING Bilateral 11/26/2016   Procedure: LIPOFILLING FOR SYMMETRY;  Surgeon: [place] Going, DO;  Location: [place];  Service: Plastics;  Laterality: Bilateral;  . LIPOSUCTION WITH LIPOFILLING Bilateral 01/21/2017   Procedure: BILATERAL BREAST  LIPOFILLING FOR ASYMMETRY;  Surgeon: [place] Going, DO;  Location: [place];  Service: Plastics;  Laterality: Bilateral;  . LIPOSUCTION WITH LIPOFILLING Bilateral 06/28/2020   Procedure: Lipofilling bilateral breasts for asymmetry;  Surgeon: [place] Going, DO;  Location: [place];  Service: Plastics;  Laterality: Bilateral;  90 min  . MASTECTOMY Bilateral 01/09/2016  . NIPPLE SPARING MASTECTOMY/SENTINAL LYMPH NODE BIOPSY/RECONSTRUCTION/PLACEMENT OF TISSUE EXPANDER Bilateral 01/09/2016   Procedure: BILATERAL NIPPLE SPARING MASTECTOMY WITH LEFT SENTINAL LYMPH NODE BIOPSY ;  Surgeon: [place] Klein, MD;  Location: [place];  Service: General;  Laterality: Bilateral;  . PHOTOCOAGULATION WITH LASER Left 10/04/2020   Procedure: PHOTOCOAGULATION WITH LASER;  Surgeon: Bernarda Caffey, MD;  Location: [place];  Service: Ophthalmology;  Laterality:  Left;  . REMOVAL OF BILATERAL TISSUE EXPANDERS WITH PLACEMENT OF BILATERAL BREAST IMPLANTS Bilateral 08/20/2016   Procedure: REMOVAL OF BILATERAL TISSUE EXPANDERS WITH PLACEMENT OF BILATERAL SILICONE IMPLANTS;  Surgeon: [place] Going, DO;  Location: [place];  Service: Plastics;  Laterality: Bilateral;  . REMOVAL OF BILATERAL TISSUE EXPANDERS WITH PLACEMENT OF BILATERAL BREAST IMPLANTS Bilateral 11/05/2017   Procedure: REMOVAL OF BILATERAL TISSUE EXPANDERS WITH PLACEMENT OF BILATERAL BREAST SILICONE IMPLANTS;  Surgeon: [place] Going, DO;  Location: [place];  Service: Plastics;  Laterality: Bilateral;  . REMOVAL OF TISSUE EXPANDER Bilateral 02/11/2016   Procedure: REMOVAL OF BILATERAL TISSUE EXPANDERS AND FLEX HD REMOVAL;  Surgeon: [place] Going, DO;  Location: [place];  Service: Plastics;  Laterality: Bilateral;  . RETINAL DETACHMENT SURGERY Left 10/01/2020   Pneumatic retinopexy for repair of rheg RD - Dr. Bernarda Caffey  . RETINAL DETACHMENT SURGERY Left 10/04/2020   PPV - Dr. Bernarda Caffey  . SCLERAL BUCKLE Left 10/04/2020   Procedure: SCLERAL BUCKLE LEFT EYE;  Surgeon: Bernarda Caffey, MD;  Location: [place];  Service: Ophthalmology;  Laterality: Left;  . TISSUE EXPANDER PLACEMENT Bilateral 08/12/2017   Procedure: PLACEMENT OF BILATERAL TISSUE EXPANDER;  Surgeon: [place] Going, DO;  Location: [place];  Service: Plastics;  Laterality: Bilateral;  . TUBAL LIGATION Bilateral 1991  . VITRECTOMY 25 GAUGE WITH SCLERAL BUCKLE Left 10/04/2020   Procedure: 25 GAUGE PARS PLANA VITRECTOMY LEFT EYE ;  Surgeon: Bernarda Caffey, MD;  Location: [place];  Service: Ophthalmology;  Laterality: Left;    FAMILY HISTORY Family History  Problem Relation Age of Onset  . Heart failure Father   . Prostate cancer Father 60  . Retinal detachment Father   . Colon polyps Mother        approx 2  . Other Mother        hx HPV and  hysterectomy due to precancerous cells  . Depression Mother   . Anxiety disorder Mother   . Other Sister 27       hx of hysterectomy for unspecified reason; still has ovaries  . Other Sister 23       paternal half-sister hx of hysterectomy for unspecified reason; still has ovaries  . Bladder Cancer Maternal Uncle 79       not a smoker  . Kidney failure Maternal Grandmother   . Congestive Heart Failure Maternal Grandmother   . Colon cancer Maternal Grandmother 64  . Diabetes Maternal Grandmother   . Lung cancer Maternal Grandfather 36       smoker  . Breast cancer Paternal Grandmother        dx. early 32s; w/ hx of trauma to breast  . Crohn's disease Daughter   . Lung cancer Maternal Uncle 27  smoker    SOCIAL HISTORY Social History   Tobacco Use  . Smoking status: Former Smoker    Packs/day: 1.00    Years: 10.00    Pack years: 10.00    Types: Cigarettes    Quit date: 06/08/2008    Years since quitting: 12.4  . Smokeless tobacco: Never Used  Vaping Use  . Vaping Use: Never used  Substance Use Topics  . Alcohol use: No  . Drug use: Never         OPHTHALMIC EXAM:  Base Eye Exam    Visual Acuity (Snellen - Linear)      Right Left   Dist cc 20/25 +1 20/300   Dist ph cc NI 20/80 -2       Tonometry (Tonopen, 8:09 AM)      Right Left   Pressure 18 17       Pupils      Dark Light Shape React APD   Right 4 3 Round Brisk None   Left 5 4.5 Round Minimal None       Visual Fields (Counting fingers)      Left Right     Full   Restrictions Partial outer inferior nasal deficiency        Extraocular Movement      Right Left    Full, Ortho Full, Ortho       Neuro/Psych    Oriented x3: Yes   Mood/Affect: Normal       Dilation    Both eyes: 1.0% Mydriacyl, 2.5% Phenylephrine @ 8:09 AM        Slit Lamp and Fundus Exam    External Exam      Right Left   External  Periorbital edema       Slit Lamp Exam      Right Left   Lids/Lashes  Dermatochalasis - upper lid Dermatochalasis - upper lid   Conjunctiva/Sclera White and quiet Trace ST Subconjunctival hemorrhage, sutures dissolved   Cornea 1+ Punctate epithelial erosions, mild tear film debris, EBMD 1+ Punctate epithelial erosions, mild tear film debris   Anterior Chamber deep, clear, narrow angles deep, clear, narrow temporal angle   Iris Round and reactive Round and dilated, mild anterior bowing   Lens 1-2+ Nuclear sclerosis, 1-2+ Cortical cataract 1-2+ Nuclear sclerosis, 2+ Cortical cataract, 2+ PSC   Vitreous Vitreous syneresis, Posterior vitreous detachment, Weiss ring post vitrectomy, 40% gas bubble       Fundus Exam      Right Left   Disc Pink and Sharp, temporal PPA Pink and Sharp, mild tilt, temporal PPA   C/D Ratio 0.3 0.4   Macula Flat, Blunted foveal reflex, RPE mottling, No heme or edema Flat, Blunted foveal reflex, RPE mottling and clumping   Vessels mild attenuation mild attenuation   Periphery Attached, small operculated hole at 0430, pigmented lattice degeneration inferiorly -- early laser changes surrounding retina attached over buckle, Good buckle height, good laser over buckle and around tear, PRE-OP: Bullous inferior detachment from 0300-0800, Good early cryo changes, SRF extending to 0700; ORIGINALLY: Bullous RD from 0100-230, large HST at 0130; mild pavingstone degen inferiorly        Refraction    Wearing Rx      Sphere Cylinder Axis   Right -4.00 +0.75 100   Left -4.50 +0.75 100          IMAGING AND PROCEDURES  Imaging and Procedures for 11/15/2020  OCT, Retina - OU - Both Eyes  Right Eye Quality was good. Central Foveal Thickness: 254. Progression has been stable. Findings include abnormal foveal contour, no IRF, no SRF (irregular foveal contour ).   Left Eye Quality was borderline. Central Foveal Thickness: 255. Progression has improved. Findings include normal foveal contour, no IRF, no SRF, outer retinal atrophy (Retina  attached; Mild decrease in ellipsoid signal nasal to fovea).   Notes *Images captured and stored on drive  Diagnosis / Impression:  OD: abnormal foveal countour no IRF/SRF OS: NFP; no IRF/SRF; retina attached; Mild decrease in ellipsoid signal nasal to fovea  Clinical management:  See below  Abbreviations: NFP - Normal foveal profile. CME - cystoid macular edema. PED - pigment epithelial detachment. IRF - intraretinal fluid. SRF - subretinal fluid. EZ - ellipsoid zone. ERM - epiretinal membrane. ORA - outer retinal atrophy. ORT - outer retinal tubulation. SRHM - subretinal hyper-reflective material. IRHM - intraretinal hyper-reflective material                 ASSESSMENT/PLAN:    ICD-10-CM   1. Left retinal detachment  H33.22   2. Retinal edema  H35.81 OCT, Retina - OU - Both Eyes  3. Retinal hole of right eye  H33.321   4. 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 Combined forms of age-related cataract of both eyes  H25.813     1,2. 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 - detached from 1 to 230 oclock w/ SRF still outside of ST arcades, large horseshoe tear at 130 - s/p pneumatic retinopexy OS (01.24.22) -- had progressive worsening of SRF, post-pneumatic  - s/p scleral buckle + PPV/PFC/EL/FAX/14% C3F8 OS, 01.27.22             - doing well             - retina attached and in good position under gas -- good buckle height and laser around breaks             - IOP good at 17  - gas bubble 40%             - cont  PF 4x/day OS -- decrease to TID                         PSO ung QID OS -- PRN/bedtime -- pt not using             - can d/c face down positioning; avoid laying flat on back              - post op drop and positioning instructions reviewed  - f/u 4 weeks, DFE OU, OCT - pt is cleared to return to work part time with light duty  3. Retinal hole, OD - small operculated hole at 0430 - s/p laser  retinopexy OD (01.27.22) in OR  4. Diabetes mellitus, type 2 without retinopathy - The incidence, risk factors for progression, natural history and treatment options for diabetic retinopathy  were discussed with patient.   - The need for close monitoring of blood glucose, blood pressure, and serum lipids, avoiding cigarette or any type of tobacco, and the need for long term follow up was also discussed with patient. - monitor  5. Mixed Cataract OU - The symptoms of cataract, surgical options, and treatments and risks were discussed with patient. - discussed diagnosis and progression - progression of PSC OS post vitrectomy -- will likely need surgery  once stable from RD repair - monitor for now, but will refer back to Dr. Kathlen Mody once she is ready   Ophthalmic Meds Ordered this visit:  No orders of the defined types were placed in this encounter.     Return in about 4 weeks (around 12/13/2020) for f/u RD OS, DFE, OCT.  There are no Patient Instructions on file for this visit.   Explained the diagnoses, plan, and follow up with the patient and they expressed understanding.  Patient expressed understanding of the importance of proper follow up care.   This document serves as a record of services personally performed by Gardiner Sleeper, MD, PhD. It was created on their behalf by Estill Bakes, COT an ophthalmic technician. The creation of this record is the provider's dictation and/or activities during the visit.    Electronically signed by: Estill Bakes, COT 3.7.22 @ 9:51 AM   This document serves as a record of services personally performed by Gardiner Sleeper, MD, PhD. It was created on their behalf by San Jetty. Owens Shark, OA an ophthalmic technician. The creation of this record is the provider's dictation and/or activities during the visit.    Electronically signed by: San Jetty. Owens Shark, [place] 03.10.2022 9:51 AM  Gardiner Sleeper, M.D., Ph.D. Diseases & Surgery of the Retina and [place] 11/15/2020   I have reviewed the above documentation for accuracy and completeness, and I agree with the above. Gardiner Sleeper, M.D., Ph.D. 11/15/20 9:51 AM   Abbreviations: M myopia (nearsighted); A astigmatism; H hyperopia (farsighted); P presbyopia; Mrx spectacle prescription;  CTL contact lenses; OD right eye; OS left eye; OU both eyes  XT exotropia; ET esotropia; PEK punctate epithelial keratitis; PEE punctate epithelial erosions; DES dry eye syndrome; MGD meibomian gland dysfunction; ATs artificial tears; PFAT's preservative free artificial tears; [place] nuclear sclerotic cataract; PSC posterior subcapsular cataract; ERM epi-retinal membrane; PVD posterior vitreous detachment; RD retinal detachment; DM diabetes mellitus; DR diabetic retinopathy; NPDR non-proliferative diabetic retinopathy; PDR proliferative diabetic retinopathy; CSME clinically significant macular edema; DME diabetic macular edema; dbh dot blot hemorrhages; CWS cotton wool spot; POAG primary open angle glaucoma; C/D cup-to-disc ratio; HVF humphrey visual field; GVF goldmann visual field; OCT optical coherence tomography; IOP intraocular pressure; BRVO Branch retinal vein occlusion; CRVO central retinal vein occlusion; CRAO central retinal artery occlusion; BRAO branch retinal artery occlusion; RT retinal tear; SB scleral buckle; PPV pars plana vitrectomy; VH Vitreous hemorrhage; PRP panretinal laser photocoagulation; IVK intravitreal kenalog; VMT vitreomacular traction; MH Macular hole;  NVD neovascularization of the disc; NVE neovascularization elsewhere; AREDS age related eye disease study; ARMD age related macular degeneration; POAG primary open angle glaucoma; EBMD epithelial/anterior basement membrane dystrophy; ACIOL anterior chamber intraocular lens; IOL intraocular lens; PCIOL posterior chamber intraocular lens; Phaco/IOL phacoemulsification with intraocular lens placement; [place] photorefractive  keratectomy; LASIK laser assisted in situ keratomileusis; HTN hypertension; DM diabetes mellitus; COPD chronic obstructive pulmonary disease

## 2020-11-14 LAB — HEMOGLOBIN A1C
Est. average glucose Bld gHb Est-mCnc: 126 mg/dL
Hgb A1c MFr Bld: 6 % — ABNORMAL HIGH (ref 4.8–5.6)

## 2020-11-14 LAB — VITAMIN D 25 HYDROXY (VIT D DEFICIENCY, FRACTURES): Vit D, 25-Hydroxy: 31.3 ng/mL (ref 30.0–100.0)

## 2020-11-14 LAB — INSULIN, RANDOM: INSULIN: 18.4 u[IU]/mL (ref 2.6–24.9)

## 2020-11-14 LAB — VITAMIN B12: Vitamin B-12: 298 pg/mL (ref 232–1245)

## 2020-11-14 NOTE — Progress Notes (Signed)
Chief Complaint:   OBESITY [name] is here to discuss her progress with her obesity treatment plan along with follow-up of her obesity related diagnoses.   Today's visit was #: 10 Starting weight: 218 lbs Starting date: 05/15/2020 Today's weight: 206 lbs Today's date: 11/12/2020 Total lbs lost to date: 12 lbs Body mass index is 31.32 kg/m.  Total weight loss percentage to date: -5.50%  Interim History:  Christina [name] says that she did not follow the meal plan at all over the last 2 weeks.  She has had increased stressors, and the last time she weighed in was on 08/15/2020.  This is her 7th week out of work and with no income due to eye surgery.  She is getting very little protein in currently.  Plan:  We discussed stress management techniques and emotional eating strategies.  Current Meal Plan: the Category 3 Plan for 0% of the time.  Current Exercise Plan: None.  Assessment/Plan:   1. Vitamin D deficiency Not at goal. Current vitamin D is 21.2, tested on 05/15/2020. Optimal goal > 50 ng/dL.  She is taking vitamin D 50,000 IU weekly.  Plan: Continue to take prescription Vitamin D @50 ,000 IU every week as prescribed.  Follow-up for routine testing of Vitamin D, at least 2-3 times per year to avoid over-replacement.  - Refill Vitamin D, Ergocalciferol, (DRISDOL) 1.25 MG (50000 UNIT) CAPS capsule; Take 1 capsule (50,000 Units total) by mouth every 7 (seven) days.  Dispense: 4 capsule; Refill: 0  2. Prediabetes Not at goal. Goal is HgbA1c < 5.7.  Medication: metformin 500 mg twice daily.    Plan:  She will continue to focus on protein-rich, low simple carbohydrate foods. We reviewed the importance of hydration, regular exercise for stress reduction, and restorative sleep.  Continue metformin.  Will refill today, as per below.  Lab Results  Component Value Date   HGBA1C 6.3 (H) 05/15/2020   Lab Results  Component Value Date   INSULIN 34.7 (H) 05/15/2020   - Refill metFORMIN  (GLUCOPHAGE) 500 MG tablet; Take 1 tablet (500 mg total) by mouth 2 (two) times daily with a meal.  Dispense: 60 tablet; Refill: 0  3. Mood disorder ([place]), with emotional eating Uncontrolled.  Medication: Topamax 50 mg daily.  Still with emotional eating and worse with increased stress of not working.  Plan:  She will follow-up with her PCP for management of mood.  We discussed stress management techniques.  Failed Wellbutrin in the past as it caused a lot of weight gain for her.  Continue topiramate, but increase to twice daily dosing.  Behavior modification techniques were discussed today to help deal with emotional/non-hunger eating behaviors.  - Increase topiramate (TOPAMAX) 50 MG tablet to; 1 po twice daily  Dispense: 60 tablet; Refill: 0  4. At risk for side effect of medication Due to Chasitie's increase in Topamax dose, she is at a higher risk for drug side effect.  At least 12 minutes was spent on counseling her about these concerns today.  We discussed the benefits and potential risks of these medications, and all of patient's concerns were addressed and questions were answered.  she will call us, or their PCP or other specialists who treat their conditions with medications, with any questions or concerns that may develop.    5. Class 1 obesity with serious comorbidity and body mass index (BMI) of 31.0 to 31.9 in adult, unspecified obesity type  Course: [name] is currently in the action stage of  change. As such, her goal is to continue with weight loss efforts.   Nutrition goals: She has agreed to the Category 3 Plan.   Exercise goals: No exercise has been prescribed at this time.  She is unable to exercise.  Behavioral modification strategies: planning for success.  [name] has agreed to follow-up with our clinic in 2-3 weeks.  She will come in before her next visit to obtain fasting blood work (A1c, FI, vitamin D, B12).  She was informed of the importance of frequent follow-up visits  to maximize her success with intensive lifestyle modifications for her multiple health conditions.   Objective:   Blood pressure 138/80, pulse 71, temperature 98 F (36.7 C), height 5\' 8"  (1.727 m), weight 206 lb (93.4 kg), SpO2 98 %. Body mass index is 31.32 kg/m.  General: Cooperative, alert, well developed, in no acute distress. HEENT: Conjunctivae and lids unremarkable. Cardiovascular: Regular rhythm.  Lungs: Normal work of breathing. Neurologic: No focal deficits.   Lab Results  Component Value Date   CREATININE 0.71 05/15/2020   BUN 10 05/15/2020   NA 138 05/15/2020   K 4.7 05/15/2020   CL 105 05/15/2020   CO2 23 05/15/2020   Lab Results  Component Value Date   ALT 34 (H) 05/15/2020   AST 23 05/15/2020   ALKPHOS 52 05/15/2020   BILITOT 0.3 05/15/2020   Lab Results  Component Value Date   HGBA1C 6.0 (H) 11/13/2020   HGBA1C 6.3 (H) 05/15/2020   Lab Results  Component Value Date   INSULIN 18.4 11/13/2020   INSULIN 34.7 (H) 05/15/2020   Lab Results  Component Value Date   TSH 1.730 05/15/2020   Lab Results  Component Value Date   CHOL 125 05/15/2020   HDL 36 (L) 05/15/2020   LDLCALC 71 05/15/2020   TRIG 95 05/15/2020   Lab Results  Component Value Date   WBC 6.8 05/15/2020   HGB 13.8 05/15/2020   HCT 42.0 05/15/2020   MCV 87 05/15/2020   PLT 308 05/15/2020   Attestation Statements:   Reviewed by clinician on day of visit: allergies, medications, problem list, medical history, surgical history, family history, social history, and previous encounter notes.  I, Water quality scientist, CMA, am acting as Location manager for [employer], DO.  I have reviewed the above documentation for accuracy and completeness, and I agree with the above. Marjory Sneddon, D.O.  The [place] was signed into law in 2016 which includes the topic of electronic health records.  This provides immediate access to information in MyChart.  This includes  consultation notes, operative notes, office notes, lab results and pathology reports.  If you have any questions about what you read please let us know at your next visit so we can discuss your concerns and take corrective action if need be.  We are right here with you.

## 2020-11-15 ENCOUNTER — Encounter (INDEPENDENT_AMBULATORY_CARE_PROVIDER_SITE_OTHER): Payer: Self-pay | Admitting: Ophthalmology

## 2020-11-15 ENCOUNTER — Ambulatory Visit (INDEPENDENT_AMBULATORY_CARE_PROVIDER_SITE_OTHER): Payer: 59 | Admitting: Ophthalmology

## 2020-11-15 ENCOUNTER — Other Ambulatory Visit: Payer: Self-pay

## 2020-11-15 DIAGNOSIS — H33321 Round hole, right eye: Secondary | ICD-10-CM

## 2020-11-15 DIAGNOSIS — H3581 Retinal edema: Secondary | ICD-10-CM | POA: Diagnosis not present

## 2020-11-15 DIAGNOSIS — H25813 Combined forms of age-related cataract, bilateral: Secondary | ICD-10-CM

## 2020-11-15 DIAGNOSIS — E119 Type 2 diabetes mellitus without complications: Secondary | ICD-10-CM

## 2020-11-15 DIAGNOSIS — H3322 Serous retinal detachment, left eye: Secondary | ICD-10-CM | POA: Diagnosis not present

## 2020-12-03 ENCOUNTER — Other Ambulatory Visit: Payer: Self-pay

## 2020-12-03 ENCOUNTER — Encounter (INDEPENDENT_AMBULATORY_CARE_PROVIDER_SITE_OTHER): Payer: Self-pay | Admitting: Family Medicine

## 2020-12-03 ENCOUNTER — Ambulatory Visit (INDEPENDENT_AMBULATORY_CARE_PROVIDER_SITE_OTHER): Payer: 59 | Admitting: Family Medicine

## 2020-12-03 VITALS — BP 115/74 | HR 69 | Temp 97.9°F | Ht 68.0 in | Wt 208.0 lb

## 2020-12-03 DIAGNOSIS — Z6833 Body mass index (BMI) 33.0-33.9, adult: Secondary | ICD-10-CM

## 2020-12-03 DIAGNOSIS — R7303 Prediabetes: Secondary | ICD-10-CM | POA: Diagnosis not present

## 2020-12-03 DIAGNOSIS — E538 Deficiency of other specified B group vitamins: Secondary | ICD-10-CM

## 2020-12-03 DIAGNOSIS — Z9189 Other specified personal risk factors, not elsewhere classified: Secondary | ICD-10-CM

## 2020-12-03 DIAGNOSIS — F39 Unspecified mood [affective] disorder: Secondary | ICD-10-CM | POA: Diagnosis not present

## 2020-12-03 DIAGNOSIS — E559 Vitamin D deficiency, unspecified: Secondary | ICD-10-CM

## 2020-12-03 DIAGNOSIS — E669 Obesity, unspecified: Secondary | ICD-10-CM

## 2020-12-03 MED ORDER — TOPIRAMATE 50 MG PO TABS: ORAL_TABLET | ORAL | 0 refills | Status: DC

## 2020-12-03 MED ORDER — METFORMIN HCL 500 MG PO TABS
500.0000 mg | ORAL_TABLET | Freq: Two times a day (BID) | ORAL | 0 refills | Status: DC
Start: 2020-12-03 — End: 2020-12-31

## 2020-12-03 MED ORDER — VITAMIN D (ERGOCALCIFEROL) 1.25 MG (50000 UNIT) PO CAPS: ORAL_CAPSULE | ORAL | 0 refills | Status: DC

## 2020-12-03 MED ORDER — CYANOCOBALAMIN 500 MCG PO TABS: 1000.0000 ug | ORAL_TABLET | Freq: Every day | ORAL | Status: DC

## 2020-12-11 NOTE — Progress Notes (Signed)
[facility] Note  12/13/2020     CHIEF COMPLAINT Patient presents for Post-op Follow-up   HISTORY OF PRESENT ILLNESS: Christina [name] is a 55 y.o. female who presents to the clinic today for:  HPI    Post-op Follow-up    In left eye.  Discomfort includes floaters.  Negative for pain, itching, foreign body sensation, tearing, discharge and none.  Vision is stable.  I, the attending physician,  performed the HPI with the patient and updated documentation appropriately.          Comments    Pt states vision is still blurry, gas bubble is very small, she is using PF TID only, sees occasional black floaters       Last edited by Bernarda Caffey, MD on 12/13/2020  2:26 PM. (History)    pt states her gas bubble is the size of a BB, she is back to work full time   Referring physician: Hortencia Pilar, MD [place],  [place] [address]  HISTORICAL INFORMATION:    Selected notes from the [place] Referred by Dr. Quentin Ore for RD OS   CURRENT MEDICATIONS: Current Outpatient Medications (Ophthalmic Drugs)  Medication Sig  . bacitracin-polymyxin b (POLYSPORIN) ophthalmic ointment Place into the left eye at bedtime. Place a 1/2 inch ribbon of ointment into the lower eyelid at bedtime and as needed  . neomycin-polymyxin b-dexamethasone (MAXITROL) 3.5-10000-0.1 OINT Place 1 application into the left eye 4 (four) times daily.  . prednisoLONE acetate (PRED FORTE) 1 % ophthalmic suspension Place 1 drop into the left eye 4 (four) times daily.   No current facility-administered medications for this visit. (Ophthalmic Drugs)   Current Outpatient Medications (Other)  Medication Sig  . ALPRAZolam (XANAX) 0.25 MG tablet Take 0.25 mg by mouth daily as needed for anxiety.  Marland Kitchen amitriptyline (ELAVIL) 100 MG tablet Take 0.5 tablets (50 mg total) by mouth at bedtime.  Marland Kitchen FLUoxetine (PROZAC) 40 MG capsule Take 40 mg by mouth daily.  Marland Kitchen  HYDROcodone-acetaminophen (NORCO/VICODIN) 5-325 MG tablet Take 1 tablet by mouth every 4 (four) hours as needed for moderate pain.  . metFORMIN (GLUCOPHAGE) 500 MG tablet Take 1 tablet (500 mg total) by mouth 2 (two) times daily with a meal.  . neomycin-polymyxin-hydrocortisone (CORTISPORIN) 3.5-10000-1 OTIC suspension Place into the right ear.  . topiramate (TOPAMAX) 50 MG tablet 1 po twice daily  . vitamin B-12 (CYANOCOBALAMIN) 500 MCG tablet Take 2 tablets (1,000 mcg total) by mouth daily.  . Vitamin D, Ergocalciferol, (DRISDOL) 1.25 MG (50000 UNIT) CAPS capsule 1 po q wed and 1 po q sun   No current facility-administered medications for this visit. (Other)      REVIEW OF SYSTEMS: ROS    Positive for: Cardiovascular, Eyes, Psychiatric, Heme/Lymph   Negative for: Constitutional, Gastrointestinal, Neurological, Skin, Genitourinary, Musculoskeletal, HENT, Endocrine, Respiratory, Allergic/Imm   Last edited by Debbrah Alar, COT on 12/13/2020  8:12 AM. (History)       ALLERGIES No Known Allergies  PAST MEDICAL HISTORY Past Medical History:  Diagnosis Date  . Anemia   . Arthritis    hands and knees  . 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 Cataracts, bilateral   . Depression   . GAD (generalized anxiety disorder)   . Gallbladder  problem   . History of ovarian cyst   . IBS (irritable bowel syndrome)   . Joint pain   . PONV (postoperative nausea and vomiting)    does well with scop patch  . Pre-diabetes    followed by pcp; on metformin for weight loss -Dr Mellody Dance  . Retinal detachment    Rheg OS  . Right knee meniscal tear   . Urgency of urination    Past Surgical History:  Procedure Laterality Date  . ABDOMINAL HYSTERECTOMY  05/1996   endometriosis  . ACHILLES TENDON REPAIR Right 2010;  revision 2011  .  BLADDER SUSPENSION  2000  . BREAST BIOPSY Left 10/2015  . BREAST IMPLANT REMOVAL Bilateral 08/12/2017   Procedure: REMOVAL BILATERAL BREAST IMPLANTS;  Surgeon: Wallace Going, DO;  Location: [facility];  Service: Plastics;  Laterality: Bilateral;  . BREAST RECONSTRUCTION WITH PLACEMENT OF TISSUE EXPANDER AND FLEX HD (ACELLULAR HYDRATED DERMIS) Bilateral 01/09/2016  . BREAST RECONSTRUCTION WITH PLACEMENT OF TISSUE EXPANDER AND FLEX HD (ACELLULAR HYDRATED DERMIS) Bilateral 01/09/2016   Procedure: BREAST RECONSTRUCTION WITH PLACEMENT OF TISSUE EXPANDER AND FLEX HD (ACELLULAR HYDRATED DERMIS);  Surgeon: Loel Lofty Dillingham, DO;  Location: [facility];  Service: Plastics;  Laterality: Bilateral;  . BREAST RECONSTRUCTION WITH PLACEMENT OF TISSUE EXPANDER AND FLEX HD (ACELLULAR HYDRATED DERMIS) Bilateral 05/29/2016   Procedure: PLACEMENT OF BILATERAL TISSUE EXPANDER AND FLEX HD (ACELLULAR HYDRATED DERMIS);  Surgeon: Wallace Going, DO;  Location: [facility];  Service: Plastics;  Laterality: Bilateral;  . BREAST REDUCTION SURGERY Bilateral 11/26/2016   Procedure: BILATERAL BREAST CAPSULE CONTRACTURE RELASE;  Surgeon: Wallace Going, DO;  Location: [facility];  Service: Plastics;  Laterality: Bilateral;  . [place]; 1989; 1991  . ELBOW SURGERY Right x3   last one 02-01-2019 @ [facility]  . EYE SURGERY Left 10/01/2020   Pneumatic retinopexy for repair of rheg RD - Dr. Bernarda Caffey  . EYE SURGERY Left 10/04/2020   PPV - Dr. Bernarda Caffey  . FAT GRAFTING BILATERAL BREAST  08-09-2018  _0   . GAS INSERTION Left 10/04/2020   Procedure: INSERTION OF GAS;  Surgeon: Bernarda Caffey, MD;  Location: [facility];  Service: Ophthalmology;  Laterality: Left;  Marland Kitchen GAS/FLUID EXCHANGE Left 10/04/2020   Procedure: GAS/FLUID EXCHANGE;  Surgeon: Bernarda Caffey, MD;  Location: [facility];  Service: Ophthalmology;  Laterality: Left;  . INCISION AND DRAINAGE OF WOUND Bilateral 02/11/2016    Procedure: IRRIGATION AND DEBRIDEMENT OF BILATERAL BREAST POCKET;  Surgeon: Wallace Going, DO;  Location: [facility];  Service: Plastics;  Laterality: Bilateral;  . KNEE ARTHROSCOPY WITH MEDIAL MENISECTOMY Right 12/20/2019   Procedure: KNEE ARTHROSCOPY WITH MEDIAL MENISECTOMY;  Surgeon: Marchia Bond, MD;  Location: [facility];  Service: Orthopedics;  Laterality: Right;  . LAPAROSCOPIC APPENDECTOMY  04-07-2011   _1    w/ Excision peritoneal lipoma and lysis adhesions  . LAPAROSCOPIC CHOLECYSTECTOMY  ~ 1999  . LASER PHOTO ABLATION Right 10/04/2020   Procedure: LASER RETINOPEXY WITH INDIRECT LASER OPTHALMOSCOPE, RIGHT EYE;  Surgeon: Bernarda Caffey, MD;  Location: [facility];  Service: Ophthalmology;  Laterality: Right;  . LIPOSUCTION WITH LIPOFILLING Bilateral 11/26/2016   Procedure: LIPOFILLING FOR SYMMETRY;  Surgeon: Wallace Going, DO;  Location: [facility];  Service: Plastics;  Laterality: Bilateral;  . LIPOSUCTION WITH LIPOFILLING Bilateral 01/21/2017   Procedure: BILATERAL BREAST  LIPOFILLING FOR ASYMMETRY;  Surgeon: Wallace Going, DO;  Location: [facility];  Service: Clinical cytogeneticist;  Laterality: Bilateral;  . LIPOSUCTION WITH LIPOFILLING Bilateral 06/28/2020   Procedure: Lipofilling bilateral breasts for asymmetry;  Surgeon: Wallace Going, DO;  Location: [facility];  Service: Plastics;  Laterality: Bilateral;  90 min  . MASTECTOMY Bilateral 01/09/2016  . NIPPLE SPARING MASTECTOMY/SENTINAL LYMPH NODE BIOPSY/RECONSTRUCTION/PLACEMENT OF TISSUE EXPANDER Bilateral 01/09/2016   Procedure: BILATERAL NIPPLE SPARING MASTECTOMY WITH LEFT SENTINAL LYMPH NODE BIOPSY ;  Surgeon: Stark Klein, MD;  Location: [facility];  Service: General;  Laterality: Bilateral;  . PHOTOCOAGULATION WITH LASER Left 10/04/2020   Procedure: PHOTOCOAGULATION WITH LASER;  Surgeon: Bernarda Caffey, MD;  Location: [facility];  Service: Ophthalmology;   Laterality: Left;  . REMOVAL OF BILATERAL TISSUE EXPANDERS WITH PLACEMENT OF BILATERAL BREAST IMPLANTS Bilateral 08/20/2016   Procedure: REMOVAL OF BILATERAL TISSUE EXPANDERS WITH PLACEMENT OF BILATERAL SILICONE IMPLANTS;  Surgeon: Wallace Going, DO;  Location: [facility];  Service: Plastics;  Laterality: Bilateral;  . REMOVAL OF BILATERAL TISSUE EXPANDERS WITH PLACEMENT OF BILATERAL BREAST IMPLANTS Bilateral 11/05/2017   Procedure: REMOVAL OF BILATERAL TISSUE EXPANDERS WITH PLACEMENT OF BILATERAL BREAST SILICONE IMPLANTS;  Surgeon: Wallace Going, DO;  Location: [facility];  Service: Plastics;  Laterality: Bilateral;  . REMOVAL OF TISSUE EXPANDER Bilateral 02/11/2016   Procedure: REMOVAL OF BILATERAL TISSUE EXPANDERS AND FLEX HD REMOVAL;  Surgeon: Wallace Going, DO;  Location: [facility];  Service: Plastics;  Laterality: Bilateral;  . RETINAL DETACHMENT SURGERY Left 10/01/2020   Pneumatic retinopexy for repair of rheg RD - Dr. Bernarda Caffey  . RETINAL DETACHMENT SURGERY Left 10/04/2020   PPV - Dr. Bernarda Caffey  . SCLERAL BUCKLE Left 10/04/2020   Procedure: SCLERAL BUCKLE LEFT EYE;  Surgeon: Bernarda Caffey, MD;  Location: [facility];  Service: Ophthalmology;  Laterality: Left;  . TISSUE EXPANDER PLACEMENT Bilateral 08/12/2017   Procedure: PLACEMENT OF BILATERAL TISSUE EXPANDER;  Surgeon: Wallace Going, DO;  Location: [facility];  Service: Plastics;  Laterality: Bilateral;  . TUBAL LIGATION Bilateral 1991  . VITRECTOMY 25 GAUGE WITH SCLERAL BUCKLE Left 10/04/2020   Procedure: 25 GAUGE PARS PLANA VITRECTOMY LEFT EYE ;  Surgeon: Bernarda Caffey, MD;  Location: [facility];  Service: Ophthalmology;  Laterality: Left;    FAMILY HISTORY Family History  Problem Relation Age of Onset  . Heart failure Father   . Prostate cancer Father 68  . Retinal detachment Father   . Colon polyps Mother        approx 2  . Other Mother        hx  HPV and hysterectomy due to precancerous cells  . Depression Mother   . Anxiety disorder Mother   . Other Sister 78       hx of hysterectomy for unspecified reason; still has ovaries  . Other Sister 55       paternal half-sister hx of hysterectomy for unspecified reason; still has ovaries  . Bladder Cancer Maternal Uncle 79       not a smoker  . Kidney failure Maternal Grandmother   . Congestive Heart Failure Maternal Grandmother   . Colon cancer Maternal Grandmother 18  . Diabetes Maternal Grandmother   . Lung cancer Maternal Grandfather 51       smoker  . Breast cancer Paternal Grandmother        dx. early 38s; w/ hx of trauma to breast  . Crohn's disease Daughter   . Lung cancer Maternal Uncle 63  smoker    SOCIAL HISTORY Social History   Tobacco Use  . Smoking status: Former Smoker    Packs/day: 1.00    Years: 10.00    Pack years: 10.00    Types: Cigarettes    Quit date: 06/08/2008    Years since quitting: 12.5  . Smokeless tobacco: Never Used  Vaping Use  . Vaping Use: Never used  Substance Use Topics  . Alcohol use: No  . Drug use: Never         OPHTHALMIC EXAM:  Base Eye Exam    Visual Acuity (Snellen - Linear)      Right Left   Dist cc 20/25 -2 20/100 -1   Dist ph cc 20/20 -1 20/80 -1       Tonometry (Tonopen, 8:18 AM)      Right Left   Pressure 16 18       Pupils      Dark Light Shape React APD   Right 4 3 Round Brisk None   Left 4 3.5 Round Minimal None       Visual Fields (Counting fingers)      Left Right    Full Full       Extraocular Movement      Right Left    Full, Ortho Full, Ortho       Neuro/Psych    Oriented x3: Yes   Mood/Affect: Normal       Dilation    Both eyes: 1.0% Mydriacyl, 2.5% Phenylephrine @ 8:18 AM        Slit Lamp and Fundus Exam    External Exam      Right Left   External  Periorbital edema       Slit Lamp Exam      Right Left   Lids/Lashes Dermatochalasis - upper lid Dermatochalasis -  upper lid   Conjunctiva/Sclera White and quiet Trace ST Subconjunctival hemorrhage, sutures dissolved   Cornea 1-2+ Punctate epithelial erosions 1+ Punctate epithelial erosions, mild tear film debris   Anterior Chamber deep, clear, narrow angles deep, clear, narrow temporal angle   Iris Round and reactive Round and dilated, mild anterior bowing   Lens 2+ Nuclear sclerosis, 2+ Cortical cataract 1-2+ Nuclear sclerosis, 2+ Cortical cataract, 2+ PSC   Vitreous Vitreous syneresis, Posterior vitreous detachment, Weiss ring post vitrectomy, 1% gas bubble       Fundus Exam      Right Left   Disc Pink and Sharp, temporal PPA Pink and Sharp, mild tilt, temporal PPA   C/D Ratio 0.3 0.4   Macula Flat, Blunted foveal reflex, RPE mottling, No heme or edema Flat, good foveal reflex, RPE mottling and clumping   Vessels mild attenuation attenuated, mild tortuousity   Periphery Attached, small operculated hole at 0430, pigmented lattice degeneration inferiorly -- early laser changes surrounding retina attached over buckle, Good buckle height, good laser over buckle and around tear, PRE-OP: Bullous inferior detachment from 0300-0800, Good early cryo changes, SRF extending to 0700; ORIGINALLY: Bullous RD from 0100-230, large HST at 0130; mild p[place]stone degen inferiorly          IMAGING AND PROCEDURES  Imaging and Procedures for 12/13/2020  OCT, Retina - OU - Both Eyes       Right Eye Quality was good. Central Foveal Thickness: 256. Progression has been stable. Findings include abnormal foveal contour, no IRF, no SRF (irregular foveal contour ).   Left Eye Quality was borderline. Central Foveal Thickness: 251. Progression has improved.  Findings include normal foveal contour, no IRF, no SRF, outer retinal atrophy (Retina attached; Mild improvement in ellipsoid signal nasal macula).   Notes *Images captured and stored on drive  Diagnosis / Impression:  OD: abnormal foveal countour no IRF/SRF OS: NFP;  no IRF/SRF; Retina attached; Mild improvement in ellipsoid signal nasal macula  Clinical management:  See below  Abbreviations: NFP - Normal foveal profile. CME - cystoid macular edema. PED - pigment epithelial detachment. IRF - intraretinal fluid. SRF - subretinal fluid. EZ - ellipsoid zone. ERM - epiretinal membrane. ORA - outer retinal atrophy. ORT - outer retinal tubulation. SRHM - subretinal hyper-reflective material. IRHM - intraretinal hyper-reflective material                 ASSESSMENT/PLAN:    ICD-10-CM   1. Left retinal detachment  H33.22   2. Retinal edema  H35.81 OCT, Retina - OU - Both Eyes  3. Retinal hole of right eye  H33.321   4. Diabetes mellitus type 2 without retinopathy ([place])  E11.9   5. Combined forms of age-related cataract of both eyes  H25.813     1,2. 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 Retinal hole, OD - small operculated hole at 0430 - s/p laser retinopexy OD (01.27.22) in OR -- good laser in place  4. Diabetes mellitus, type 2 without retinopathy - The incidence, risk factors for progression, natural history and treatment options for diabetic retinopathy  were discussed with patient.   - The need for close monitoring of blood glucose, blood pressure, and serum lipids, avoiding cigarette or any type of tobacco,  and the need for long term follow up was also discussed with patient. - monitor  5. Mixed Cataract OU - The symptoms of cataract, surgical options, and treatments and risks were discussed with patient. - discussed diagnosis and progression - progression of PSC OS post vitrectomy -- will likely need surgery once stable from RD repair - unable to refer back to Dr. Kathlen Mody due to [facility] - will refer to [facility] for cat eval  Ophthalmic Meds Ordered this visit:  No orders of the defined types were placed in this encounter.     Return in about 4 weeks (around 01/10/2021) for f/u RD OS, DFE, OCT.  There are no Patient Instructions on file for this visit.   Explained the diagnoses, plan, and follow up with the patient and they expressed understanding.  Patient expressed understanding of the importance of proper follow up care.  This document serves as a record of services personally performed by Gardiner Sleeper, MD, PhD. It was created on their behalf by Estill Bakes, COT an ophthalmic technician. The creation of this record is the provider's dictation and/or activities during the visit.    Electronically signed by: Estill Bakes, COT 4.5.22 @ 2:37 PM   This document serves as a record of services personally performed by Gardiner Sleeper, MD, PhD. It was created on their behalf by San Jetty. Owens Shark, OA an ophthalmic technician. The creation of this record is the provider's dictation and/or activities during the visit.    Electronically signed by: San Jetty. Marguerita Merles 04.07.2022 2:37 PM  Gardiner Sleeper, M.D., Ph.D. Diseases & Surgery of the Retina and [place] 12/13/2020   I have reviewed the above documentation for accuracy and completeness, and I agree with the above. Gardiner Sleeper, M.D., Ph.D. 12/13/20 2:37 PM  Abbreviations: M myopia (nearsighted); A astigmatism; H hyperopia (farsighted); P presbyopia; Mrx spectacle  prescription;  CTL contact lenses; OD right eye; OS left eye; OU both eyes  XT exotropia; ET esotropia; PEK punctate epithelial keratitis; PEE punctate epithelial erosions; DES dry eye syndrome; MGD meibomian gland dysfunction; ATs artificial tears; PFAT's preservative free artificial tears; [place] nuclear sclerotic cataract; PSC posterior subcapsular cataract; ERM epi-retinal membrane; PVD posterior vitreous detachment; RD retinal detachment; DM diabetes mellitus; DR diabetic retinopathy; NPDR non-proliferative diabetic retinopathy; PDR proliferative diabetic retinopathy; CSME clinically significant macular edema; DME diabetic macular edema; dbh dot blot hemorrhages; CWS cotton wool spot; POAG primary open angle glaucoma; C/D cup-to-disc ratio; HVF humphrey visual field; GVF goldmann visual field; OCT optical coherence tomography; IOP intraocular pressure; BRVO Branch retinal vein occlusion; CRVO central retinal vein occlusion; CRAO central retinal artery occlusion; BRAO branch retinal artery occlusion; RT retinal tear; SB scleral buckle; PPV pars plana vitrectomy; VH Vitreous hemorrhage; PRP panretinal laser photocoagulation; IVK intravitreal kenalog; VMT vitreomacular traction; MH Macular hole;  NVD neovascularization of the disc; NVE neovascularization elsewhere; AREDS age related eye disease study; ARMD age related macular degeneration; POAG primary open angle glaucoma; EBMD epithelial/anterior basement membrane dystrophy; ACIOL anterior chamber intraocular lens; IOL intraocular lens; PCIOL posterior chamber intraocular lens; Phaco/IOL phacoemulsification with intraocular lens placement; [place] photorefractive keratectomy; LASIK laser assisted in situ keratomileusis; HTN hypertension; DM diabetes mellitus; COPD chronic obstructive pulmonary disease

## 2020-12-11 NOTE — Progress Notes (Signed)
Chief Complaint:   OBESITY Christina [name] is here to discuss her progress with her obesity treatment plan along with follow-up of her obesity related diagnoses.   Today's visit was #: 11 Starting weight: 218 lbs Starting date: 05/15/2020 Today's weight: 208 lbs Today's date: 12/04/2019 Total lbs lost to date: 10 lbs Body mass index is 31.63 kg/m.  Total weight loss percentage to date: -4.59%  Interim History:  Christina [name] went back to work last week.  Getting back into her groove.  Still has not followed the plan at all.  Has just lacked motivation.  Recent blood work obtained.  Here to review results with Christina [name].  Plan:  Discussed her "why".  Recommend she journal and focus on who she wants to be and why she wants to be healthy.  Also recommend CBT as needed.  Current Meal Plan: the Category 3 Plan for 0% of the time.  Current Exercise Plan: None.  Assessment/Plan:    Medications Discontinued During This Encounter  Medication Reason  . vitamin B-12 (CYANOCOBALAMIN) 500 MCG tablet Reorder  . metFORMIN (GLUCOPHAGE) 500 MG tablet Reorder  . topiramate (TOPAMAX) 50 MG tablet Reorder  . Vitamin D, Ergocalciferol, (DRISDOL) 1.25 MG (50000 UNIT) CAPS capsule Reorder     Meds ordered this encounter  Medications  . metFORMIN (GLUCOPHAGE) 500 MG tablet    Sig: Take 1 tablet (500 mg total) by mouth 2 (two) times daily with a meal.    Dispense:  60 tablet    Refill:  0  . topiramate (TOPAMAX) 50 MG tablet    Sig: 1 po twice daily    Dispense:  60 tablet    Refill:  0  . Vitamin D, Ergocalciferol, (DRISDOL) 1.25 MG (50000 UNIT) CAPS capsule    Sig: 1 po q wed and 1 po q sun    Dispense:  8 capsule    Refill:  0  . vitamin B-12 (CYANOCOBALAMIN) 500 MCG tablet    Sig: Take 2 tablets (1,000 mcg total) by mouth daily.     1. Prediabetes Not at goal. Goal is HgbA1c < 5.7.  Medication: metformin 500 mg twice daily.    Plan:  Discussed labs with patient today.  A1c has improved.  She will  continue to focus on protein-rich, low simple carbohydrate foods. We reviewed the importance of hydration, regular exercise for stress reduction, and restorative sleep.  Will refill metformin today.  Lab Results  Component Value Date   HGBA1C 6.0 (H) 11/13/2020   Lab Results  Component Value Date   INSULIN 18.4 11/13/2020   INSULIN 34.7 (H) 05/15/2020   - Refill metFORMIN (GLUCOPHAGE) 500 MG tablet; Take 1 tablet (500 mg total) by mouth 2 (two) times daily with a meal.  Dispense: 60 tablet; Refill: 0  2. Vitamin B12 deficiency Lab Results  Component Value Date   VITAMINB12 298 11/13/2020   Supplementation:  Vitamin B12 500 mcg daily.  Not at goal of >300, but improved.  Plan:  Discussed labs with patient today.  Double OTC B12 from 500 to 1,000 mcg daily.  - Increase vitamin B-12 (CYANOCOBALAMIN) 500 MCG tablet; Take 2 tablets (1,000 mcg total) by mouth daily.  3. Vitamin D deficiency Not at goal. Current vitamin D is 31.3, tested on 11/13/2020. Optimal goal > 50 ng/dL.  She is taking vitamin D 50,000 IU twice weekly.  Plan: Discussed labs with patient today.  Continue to take prescription Vitamin D @50 ,000 IU twice weekly as prescribed.  Follow-up  for routine testing of Vitamin D, at least 2-3 times per year to avoid over-replacement.  - Refill Vitamin D, Ergocalciferol, (DRISDOL) 1.25 MG (50000 UNIT) CAPS capsule; 1 po q wed and 1 po q sun  Dispense: 8 capsule; Refill: 0  4. Mood disorder, with emotional eating Stable.  Medication: Topamax 50 mg twice daily.  No issues or acute concerns.  She says that Topamax is working well for emotional eating.  Plan:  Discussed labs with patient today.  Discussed labs with patient today.  Behavior modification techniques were discussed today to help deal with emotional/non-hunger eating behaviors.  - Refill topiramate (TOPAMAX) 50 MG tablet; 1 po twice daily  Dispense: 60 tablet; Refill: 0  5. At risk for impaired metabolic function Due to  Tykiera's current state of health and medical condition(s), she is at a significantly higher risk for impaired metabolic function.   At least 23 minutes was spent on counseling Christina [name] about these concerns today.  This places the patient at a much greater risk to subsequently develop cardio-pulmonary conditions that can negatively affect the patient's quality of life.  I stressed the importance of reversing these risks factors.  The initial goal is to lose at least 5-10% of starting weight to help reduce risk factors.  Counseling:  Intensive lifestyle modifications discussed with Christina [name] as the most appropriate first line treatment.  she will continue to work on diet, exercise, and weight loss efforts.  We will continue to reassess these conditions on a fairly regular basis in an attempt to decrease the patient's overall morbidity and mortality.  6. Obesity, current BMI 31.7  Course: [name] is currently in the action stage of change. As such, her goal is to continue with weight loss efforts.   Nutrition goals: She has agreed to the Category 3 Plan.   Exercise goals: All adults should avoid inactivity. Some physical activity is better than none, and adults who participate in any amount of physical activity gain some health benefits.  Behavioral modification strategies: increasing lean protein intake, decreasing simple carbohydrates, meal planning and cooking strategies and planning for success.  Christina [name] has agreed to follow-up with our clinic in 2-3 weeks. She was informed of the importance of frequent follow-up visits to maximize her success with intensive lifestyle modifications for her multiple health conditions.   Objective:   Blood pressure 115/74, pulse 69, temperature 97.9 F (36.6 C), height 5\' 8"  (1.727 m), weight 208 lb (94.3 kg), SpO2 98 %. Body mass index is 31.63 kg/m.  General: Cooperative, alert, well developed, in no acute distress. HEENT: Conjunctivae and lids  unremarkable. Cardiovascular: Regular rhythm.  Lungs: Normal work of breathing. Neurologic: No focal deficits.   Lab Results  Component Value Date   CREATININE 0.71 05/15/2020   BUN 10 05/15/2020   NA 138 05/15/2020   K 4.7 05/15/2020   CL 105 05/15/2020   CO2 23 05/15/2020   Lab Results  Component Value Date   ALT 34 (H) 05/15/2020   AST 23 05/15/2020   ALKPHOS 52 05/15/2020   BILITOT 0.3 05/15/2020   Lab Results  Component Value Date   HGBA1C 6.0 (H) 11/13/2020   HGBA1C 6.3 (H) 05/15/2020   Lab Results  Component Value Date   INSULIN 18.4 11/13/2020   INSULIN 34.7 (H) 05/15/2020   Lab Results  Component Value Date   TSH 1.730 05/15/2020   Lab Results  Component Value Date   CHOL 125 05/15/2020   HDL 36 (L) 05/15/2020   Lilly  71 05/15/2020   TRIG 95 05/15/2020   Lab Results  Component Value Date   WBC 6.8 05/15/2020   HGB 13.8 05/15/2020   HCT 42.0 05/15/2020   MCV 87 05/15/2020   PLT 308 05/15/2020   Attestation Statements:   Reviewed by clinician on day of visit: allergies, medications, problem list, medical history, surgical history, family history, social history, and previous encounter notes.  I, Water quality scientist, CMA, am acting as Location manager for [employer], DO.  I have reviewed the above documentation for accuracy and completeness, and I agree with the above. Marjory Sneddon, D.O.  The [place] was signed into law in 2016 which includes the topic of electronic health records.  This provides immediate access to information in MyChart.  This includes consultation notes, operative notes, office notes, lab results and pathology reports.  If you have any questions about what you read please let Christina [name] know at your next visit so we can discuss your concerns and take corrective action if need be.  We are right here with you.

## 2020-12-13 ENCOUNTER — Other Ambulatory Visit: Payer: Self-pay

## 2020-12-13 ENCOUNTER — Encounter (INDEPENDENT_AMBULATORY_CARE_PROVIDER_SITE_OTHER): Payer: Self-pay | Admitting: Ophthalmology

## 2020-12-13 ENCOUNTER — Ambulatory Visit (INDEPENDENT_AMBULATORY_CARE_PROVIDER_SITE_OTHER): Payer: 59 | Admitting: Ophthalmology

## 2020-12-13 DIAGNOSIS — H3322 Serous retinal detachment, left eye: Secondary | ICD-10-CM | POA: Diagnosis not present

## 2020-12-13 DIAGNOSIS — H33321 Round hole, right eye: Secondary | ICD-10-CM | POA: Diagnosis not present

## 2020-12-13 DIAGNOSIS — H3581 Retinal edema: Secondary | ICD-10-CM

## 2020-12-13 DIAGNOSIS — E119 Type 2 diabetes mellitus without complications: Secondary | ICD-10-CM

## 2020-12-13 DIAGNOSIS — H25813 Combined forms of age-related cataract, bilateral: Secondary | ICD-10-CM

## 2020-12-31 ENCOUNTER — Ambulatory Visit (INDEPENDENT_AMBULATORY_CARE_PROVIDER_SITE_OTHER): Payer: 59 | Admitting: Family Medicine

## 2020-12-31 ENCOUNTER — Encounter (INDEPENDENT_AMBULATORY_CARE_PROVIDER_SITE_OTHER): Payer: Self-pay | Admitting: Family Medicine

## 2020-12-31 ENCOUNTER — Other Ambulatory Visit: Payer: Self-pay

## 2020-12-31 VITALS — BP 112/68 | HR 70 | Temp 97.8°F | Ht 68.0 in | Wt 207.0 lb

## 2020-12-31 DIAGNOSIS — Z9189 Other specified personal risk factors, not elsewhere classified: Secondary | ICD-10-CM

## 2020-12-31 DIAGNOSIS — E559 Vitamin D deficiency, unspecified: Secondary | ICD-10-CM | POA: Diagnosis not present

## 2020-12-31 DIAGNOSIS — E538 Deficiency of other specified B group vitamins: Secondary | ICD-10-CM

## 2020-12-31 DIAGNOSIS — R7303 Prediabetes: Secondary | ICD-10-CM | POA: Diagnosis not present

## 2020-12-31 DIAGNOSIS — Z6831 Body mass index (BMI) 31.0-31.9, adult: Secondary | ICD-10-CM | POA: Insufficient documentation

## 2020-12-31 DIAGNOSIS — E669 Obesity, unspecified: Secondary | ICD-10-CM

## 2020-12-31 DIAGNOSIS — F39 Unspecified mood [affective] disorder: Secondary | ICD-10-CM

## 2020-12-31 DIAGNOSIS — Z6833 Body mass index (BMI) 33.0-33.9, adult: Secondary | ICD-10-CM

## 2020-12-31 MED ORDER — OZEMPIC (0.25 OR 0.5 MG/DOSE) 2 MG/1.5ML ~~LOC~~ SOPN: 0.5000 mg | PEN_INJECTOR | SUBCUTANEOUS | 0 refills | Status: DC

## 2020-12-31 MED ORDER — METFORMIN HCL 500 MG PO TABS: 500.0000 mg | ORAL_TABLET | Freq: Two times a day (BID) | ORAL | 0 refills | Status: DC

## 2020-12-31 MED ORDER — VITAMIN D (ERGOCALCIFEROL) 1.25 MG (50000 UNIT) PO CAPS: ORAL_CAPSULE | ORAL | 0 refills | Status: DC

## 2020-12-31 MED ORDER — TOPIRAMATE 50 MG PO TABS: ORAL_TABLET | ORAL | 0 refills | Status: DC

## 2021-01-01 DIAGNOSIS — Z9189 Other specified personal risk factors, not elsewhere classified: Secondary | ICD-10-CM | POA: Insufficient documentation

## 2021-01-02 NOTE — Progress Notes (Signed)
Chief Complaint:   OBESITY Christina [name] is here to discuss her progress with her obesity treatment plan along with follow-up of her obesity related diagnoses.   Today's visit was #: 12 Starting weight: 218 lbs Starting date: 05/15/2020 Today's weight: 207 lbs Today's date: 12/31/2020 Total lbs lost to date: 11 lbs Body mass index is 31.47 kg/m.  Total weight loss percentage to date: -5.05%  Interim History:  Christina [name] says it is too difficult financially to eat healthy.  She was out of work for [employer] and is struggling financially lately.  Declines a change in meal plan.  Plan:  Journal her "why" and focus on who she wants to be/not focusing on being "obese" and putting herself down.  Current Meal Plan: the Category 3 Plan for 0% of the time.  Current Exercise Plan: Walking for 15 minutes 7 times per week.  Assessment/Plan:   1. Prediabetes Not at goal. Goal is HgbA1c < 5.7.  Medication: metformin 500 mg twice daily.  She says she is not sure that metformin is helping.  She notices very little difference with it versus without it.  Plan:  Start trial of GLP-1, Ozempic 0.5 mg weekly.  If insurance covers GLP-1, will stop metformin, but will cntinue if GLP is not affordable.  She will continue to focus on protein-rich, low simple carbohydrate foods. We reviewed the importance of hydration, regular exercise for stress reduction, and restorative sleep.   Lab Results  Component Value Date   HGBA1C 6.0 (H) 11/13/2020   Lab Results  Component Value Date   INSULIN 18.4 11/13/2020   INSULIN 34.7 (H) 05/15/2020   - Refill metFORMIN (GLUCOPHAGE) 500 MG tablet; Take 1 tablet (500 mg total) by mouth 2 (two) times daily with a meal.  Dispense: 60 tablet; Refill: 0 - Start semaglutide,0.25 or 0.5MG /DOS, (OZEMPIC, 0.25 OR 0.5 MG/DOSE,) 2 MG/1.5ML SOPN; Inject 0.5 mg into the skin once a week.  Dispense: 1.5 mL; Refill: 0  2. Vitamin D deficiency Not at goal. Current vitamin D is 31.3, tested on  11/13/2020. Optimal goal > 50 ng/dL.  She is taking vitamin D 50,000 IU twice weekly.  Plan: Continue to take prescription Vitamin D @50 ,000 IU twice weekly as prescribed.  Follow-up for routine testing of Vitamin D, at least 2-3 times per year to avoid over-replacement.  - Refill Vitamin D, Ergocalciferol, (DRISDOL) 1.25 MG (50000 UNIT) CAPS capsule; 1 po q wed and 1 po q sun  Dispense: 8 capsule; Refill: 0  3. Vitamin B12 deficiency Lab Results  Component Value Date   VITAMINB12 298 11/13/2020   Supplementation: vitamin B12 1,000 mcg daily.   Plan:  Continue current treatment.  4. Mood disorder, with emotional eating Controlled. Medication: Topamax 50 mg twice daily.  Prozac 40 mg daily, Xanax 0.25 mg daily as needed, per PCP.  Denies concerns or struggles.   Plan:  Will refill Topamax today, as per below.  Continue other medications.  Behavior modification techniques were discussed today to help deal with emotional/non-hunger eating behaviors.  - Refill topiramate (TOPAMAX) 50 MG tablet; 1 po twice daily  Dispense: 60 tablet; Refill: 0  5. At risk for malnutrition Christina [name] was given extensive malnutrition prevention education and counseling today of more than 9 minutes.  Counseled her that malnutrition refers to inappropriate nutrients or not the right balance of nutrients for optimal health.  Discussed with Christina [name] that it is absolutely possible to be malnourished but yet obese.  Risk factors, including  but not limited to, inappropriate dietary choices, difficulty with obtaining food due to physical or financial limitations, and various physical and mental health conditions were reviewed with Christina [name].   6. Obesity, current BMI 31.5  Course: Christina [name] is currently in the action stage of change. As such, her goal is to continue with weight loss efforts.   Nutrition goals: She has agreed to the Category 3 Plan.   Exercise goals: For substantial health benefits, adults should  do at least 150 minutes (2 hours and 30 minutes) a week of moderate-intensity, or 75 minutes (1 hour and 15 minutes) a week of vigorous-intensity aerobic physical activity, or an equivalent combination of moderate- and vigorous-intensity aerobic activity. Aerobic activity should be performed in episodes of at least 10 minutes, and preferably, it should be spread throughout the week.  Behavioral modification strategies: meal planning and cooking strategies, keeping healthy foods in the home, better snacking choices and planning for success.  Christina [name] has agreed to follow-up with our clinic in 3 weeks. She was informed of the importance of frequent follow-up visits to maximize her success with intensive lifestyle modifications for her multiple health conditions.   Objective:   Blood pressure 112/68, pulse 70, temperature 97.8 F (36.6 C), height 5\' 8"  (1.727 m), weight 207 lb (93.9 kg), SpO2 98 %. Body mass index is 31.47 kg/m.  General: Cooperative, alert, well developed, in no acute distress. HEENT: Conjunctivae and lids unremarkable. Cardiovascular: Regular rhythm.  Lungs: Normal work of breathing. Neurologic: No focal deficits.   Lab Results  Component Value Date   CREATININE 0.71 05/15/2020   BUN 10 05/15/2020   NA 138 05/15/2020   K 4.7 05/15/2020   CL 105 05/15/2020   CO2 23 05/15/2020   Lab Results  Component Value Date   ALT 34 (H) 05/15/2020   AST 23 05/15/2020   ALKPHOS 52 05/15/2020   BILITOT 0.3 05/15/2020   Lab Results  Component Value Date   HGBA1C 6.0 (H) 11/13/2020   HGBA1C 6.3 (H) 05/15/2020   Lab Results  Component Value Date   INSULIN 18.4 11/13/2020   INSULIN 34.7 (H) 05/15/2020   Lab Results  Component Value Date   TSH 1.730 05/15/2020   Lab Results  Component Value Date   CHOL 125 05/15/2020   HDL 36 (L) 05/15/2020   LDLCALC 71 05/15/2020   TRIG 95 05/15/2020   Lab Results  Component Value Date   WBC 6.8 05/15/2020   HGB 13.8 05/15/2020    HCT 42.0 05/15/2020   MCV 87 05/15/2020   PLT 308 05/15/2020   Attestation Statements:   Reviewed by clinician on day of visit: allergies, medications, problem list, medical history, surgical history, family history, social history, and previous encounter notes.  I, Water quality scientist, CMA, am acting as Location manager for [employer], DO.  I have reviewed the above documentation for accuracy and completeness, and I agree with the above. Marjory Sneddon, D.O.  The [place] was signed into law in 2016 which includes the topic of electronic health records.  This provides immediate access to information in MyChart.  This includes consultation notes, operative notes, office notes, lab results and pathology reports.  If you have any questions about what you read please let us know at your next visit so we can discuss your concerns and take corrective action if need be.  We are right here with you.

## 2021-01-06 NOTE — Progress Notes (Incomplete)
Patient Care Team: Maurice Small, MD as PCP - General (Family Medicine) Megan Salon, MD as Consulting Physician (Gynecology) Sylvan Cheese, NP as Nurse Practitioner (Hematology and Oncology)  DIAGNOSIS: No diagnosis found.  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 p(m)T1b, pN0: Stage IA; RIGHT mastectomy: PASH    01/09/2016 Oncotype testing   Oncotype score: 5; 5% ROR   02/2016 -  Anti-estrogen oral therapy   Tamoxifen 20 mg daily.      CHIEF COMPLIANT: Follow-up of breast cancer on tamoxifen therapy  INTERVAL HISTORY: Christina [name] is a 55 y.o. with above-mentioned history of breast cancer treated with bilateral mastectomies followed by tamoxifen therapy. She presents to the clinic today for annual follow-up.  ALLERGIES:  has No Known Allergies.  MEDICATIONS:  Current Outpatient Medications  Medication Sig Dispense Refill  . ALPRAZolam (XANAX) 0.25 MG tablet Take 0.25 mg by mouth daily as needed for anxiety.    Marland Kitchen amitriptyline (ELAVIL) 100 MG tablet Take 0.5 tablets (50 mg total) by mouth at bedtime.    .  bacitracin-polymyxin b (POLYSPORIN) ophthalmic ointment Place into the left eye at bedtime. Place a 1/2 inch ribbon of ointment into the lower eyelid at bedtime and as needed 3.5 g 3  . FLUoxetine (PROZAC) 40 MG capsule Take 40 mg by mouth daily.    Marland Kitchen HYDROcodone-acetaminophen (NORCO/VICODIN) 5-325 MG tablet Take 1 tablet by mouth every 4 (four) hours as needed for moderate pain. 20 tablet 0  . metFORMIN (GLUCOPHAGE) 500 MG tablet Take 1 tablet (500 mg total) by mouth 2 (two) times daily with a meal. 60 tablet 0  . neomycin-polymyxin b-dexamethasone (MAXITROL) 3.5-10000-0.1 OINT Place 1 application into the left eye 4 (four) times daily. 3.5 g 3  . neomycin-polymyxin-hydrocortisone (CORTISPORIN) 3.5-10000-1 OTIC suspension Place into the right ear.    . prednisoLONE acetate (PRED FORTE) 1 % ophthalmic suspension Place 1 drop into the left eye 4 (four) times daily. 15 mL 0  . Semaglutide,0.25 or 0.5MG /DOS, (OZEMPIC, 0.25 OR 0.5 MG/DOSE,) 2 MG/1.5ML SOPN Inject 0.5 mg into the skin once a week. 1.5 mL 0  . topiramate (TOPAMAX) 50 MG tablet 1 po twice daily 60 tablet 0  . vitamin B-12 (CYANOCOBALAMIN) 500 MCG tablet Take 2 tablets (1,000 mcg total) by mouth daily.    . Vitamin D, Ergocalciferol, (DRISDOL) 1.25 MG (50000 UNIT) CAPS capsule 1 po q wed and 1 po q sun 8 capsule 0   No current facility-administered medications for this visit.    PHYSICAL EXAMINATION: ECOG PERFORMANCE STATUS: {CHL ONC ECOG PS:[phone]}  There were no vitals filed for this visit. There were no vitals filed for this visit.  BREAST:*** No  palpable masses or nodules in either right or left breasts. No palpable axillary supraclavicular or infraclavicular adenopathy no breast tenderness or nipple discharge. (exam performed in the presence of a chaperone)  LABORATORY DATA:  I have reviewed the data as listed CMP Latest Ref Rng & Units 05/15/2020 12/20/2019 01/27/2016  Glucose 65 - 99 mg/dL 96 110(H) 120(H)  BUN 6 - 24 mg/dL  _0 Creatinine 0.57 - 1.00 mg/dL 0.71 0.50 0.86  Sodium 134 - 144 mmol/L 138 139 136  Potassium 3.5 - 5.2 mmol/L 4.7 4.1 3.9  Chloride 96 - 106 mmol/L 105 107 107  CO2 20 - 29 mmol/L 23 - 22  Calcium 8.7 - 10.2 mg/dL 9.6 - 9.6  Total Protein 6.0 - 8.5 g/dL 6.8 - 6.7  Total Bilirubin 0.0 - 1.2 mg/dL 0.3 - 0.4  Alkaline Phos 48 - 121 IU/L 52 - 54  AST 0 - 40 IU/L 23 - 16  ALT 0 - 32 IU/L 34(H) - 18    Lab Results  Component Value Date   WBC 6.8 05/15/2020   HGB 13.8 05/15/2020   HCT 42.0 05/15/2020   MCV 87 05/15/2020   PLT 308 05/15/2020   NEUTROABS 4.2 05/15/2020    ASSESSMENT & PLAN:  No problem-specific Assessment & Plan notes found for this encounter.    No orders of the defined types were placed in this encounter.  The patient has a good understanding of the overall plan. she agrees with it. she will call with any problems that may develop before the next visit here.  Total time spent: *** mins including face to face time and time spent for planning, charting and coordination of care  Rulon Eisenmenger, MD, MPH 01/06/2021  I, Cloyde Reams Dorshimer, am acting as scribe for Dr. Nicholas Lose.  {insert scribe attestation}

## 2021-01-06 NOTE — Assessment & Plan Note (Deleted)
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  Surveillance: No role of mammograms since Christina [name] had bil mastectomies Chest exam: 01/06/21: No palpable lumps in chest wall or axilla.  Return to clinic in1 yearfor follow-up andbreast exams and surveillance

## 2021-01-07 ENCOUNTER — Inpatient Hospital Stay: Payer: 59 | Attending: Hematology and Oncology | Admitting: Hematology and Oncology

## 2021-01-07 ENCOUNTER — Other Ambulatory Visit: Payer: Self-pay

## 2021-01-07 ENCOUNTER — Encounter (HOSPITAL_COMMUNITY): Payer: Self-pay

## 2021-01-07 ENCOUNTER — Emergency Department (HOSPITAL_COMMUNITY)
Admission: EM | Admit: 2021-01-07 | Discharge: 2021-01-07 | Disposition: A | Discharge status: Home / Self Care | Payer: 59 | Attending: Emergency Medicine | Admitting: Emergency Medicine

## 2021-01-07 DIAGNOSIS — R197 Diarrhea, unspecified: Secondary | ICD-10-CM | POA: Insufficient documentation

## 2021-01-07 DIAGNOSIS — Z87891 Personal history of nicotine dependence: Secondary | ICD-10-CM | POA: Diagnosis not present

## 2021-01-07 DIAGNOSIS — Z79810 Long term (current) use of selective estrogen receptor modulators (SERMs): Secondary | ICD-10-CM | POA: Insufficient documentation

## 2021-01-07 DIAGNOSIS — R531 Weakness: Secondary | ICD-10-CM | POA: Diagnosis not present

## 2021-01-07 DIAGNOSIS — Z853 Personal history of malignant neoplasm of breast: Secondary | ICD-10-CM | POA: Insufficient documentation

## 2021-01-07 DIAGNOSIS — R1010 Upper abdominal pain, unspecified: Secondary | ICD-10-CM | POA: Diagnosis not present

## 2021-01-07 DIAGNOSIS — Z7984 Long term (current) use of oral hypoglycemic drugs: Secondary | ICD-10-CM | POA: Insufficient documentation

## 2021-01-07 DIAGNOSIS — Z7952 Long term (current) use of systemic steroids: Secondary | ICD-10-CM | POA: Insufficient documentation

## 2021-01-07 DIAGNOSIS — Z170 Estrogen receptor positive status [ER+]: Secondary | ICD-10-CM | POA: Insufficient documentation

## 2021-01-07 DIAGNOSIS — R5383 Other fatigue: Secondary | ICD-10-CM | POA: Diagnosis not present

## 2021-01-07 DIAGNOSIS — C50112 Malignant neoplasm of central portion of left female breast: Secondary | ICD-10-CM | POA: Insufficient documentation

## 2021-01-07 DIAGNOSIS — Z9013 Acquired absence of bilateral breasts and nipples: Secondary | ICD-10-CM | POA: Insufficient documentation

## 2021-01-07 DIAGNOSIS — Z20822 Contact with and (suspected) exposure to covid-19: Secondary | ICD-10-CM | POA: Insufficient documentation

## 2021-01-07 DIAGNOSIS — Z79899 Other long term (current) drug therapy: Secondary | ICD-10-CM | POA: Insufficient documentation

## 2021-01-07 LAB — CBC WITH DIFFERENTIAL/PLATELET
Abs Immature Granulocytes: 0.05 10*3/uL (ref 0.00–0.07)
Basophils Absolute: 0 10*3/uL (ref 0.0–0.1)
Basophils Relative: 0 %
Eosinophils Absolute: 0 10*3/uL (ref 0.0–0.5)
Eosinophils Relative: 0 %
HCT: 46.6 % — ABNORMAL HIGH (ref 36.0–46.0)
Hemoglobin: 15.5 g/dL — ABNORMAL HIGH (ref 12.0–15.0)
Immature Granulocytes: 0 %
Lymphocytes Relative: 12 %
Lymphs Abs: 1.5 10*3/uL (ref 0.7–4.0)
MCH: 28.4 pg (ref 26.0–34.0)
MCHC: 33.3 g/dL (ref 30.0–36.0)
MCV: 85.3 fL (ref 80.0–100.0)
Monocytes Absolute: 0.6 10*3/uL (ref 0.1–1.0)
Monocytes Relative: 5 %
Neutro Abs: 10.4 10*3/uL — ABNORMAL HIGH (ref 1.7–7.7)
Neutrophils Relative %: 83 %
Platelets: 345 10*3/uL (ref 150–400)
RBC: 5.46 MIL/uL — ABNORMAL HIGH (ref 3.87–5.11)
RDW: 14.9 % (ref 11.5–15.5)
WBC: 12.6 10*3/uL — ABNORMAL HIGH (ref 4.0–10.5)
nRBC: 0 % (ref 0.0–0.2)

## 2021-01-07 LAB — URINALYSIS, ROUTINE W REFLEX MICROSCOPIC
Bilirubin Urine: NEGATIVE
Glucose, UA: NEGATIVE mg/dL
Ketones, ur: NEGATIVE mg/dL
Leukocytes,Ua: NEGATIVE
Nitrite: NEGATIVE
Protein, ur: 30 mg/dL — AB
Specific Gravity, Urine: 1.016 (ref 1.005–1.030)
pH: 5 (ref 5.0–8.0)

## 2021-01-07 LAB — RESP PANEL BY RT-PCR (FLU A&B, COVID) ARPGX2
Influenza A by PCR: NEGATIVE
Influenza B by PCR: NEGATIVE
SARS Coronavirus 2 by RT PCR: NEGATIVE

## 2021-01-07 LAB — COMPREHENSIVE METABOLIC PANEL
ALT: 22 U/L (ref 0–44)
AST: 14 U/L — ABNORMAL LOW (ref 15–41)
Albumin: 4.3 g/dL (ref 3.5–5.0)
Alkaline Phosphatase: 54 U/L (ref 38–126)
Anion gap: 8 (ref 5–15)
BUN: 26 mg/dL — ABNORMAL HIGH (ref 6–20)
CO2: 14 mmol/L — ABNORMAL LOW (ref 22–32)
Calcium: 10.1 mg/dL (ref 8.9–10.3)
Chloride: 112 mmol/L — ABNORMAL HIGH (ref 98–111)
Creatinine, Ser: 1.05 mg/dL — ABNORMAL HIGH (ref 0.44–1.00)
GFR, Estimated: >60 mL/min (ref >60)
Glucose, Bld: 130 mg/dL — ABNORMAL HIGH (ref 70–99)
Potassium: 2.8 mmol/L — ABNORMAL LOW (ref 3.5–5.1)
Sodium: 134 mmol/L — ABNORMAL LOW (ref 135–145)
Total Bilirubin: 0.7 mg/dL (ref 0.3–1.2)
Total Protein: 7.7 g/dL (ref 6.5–8.1)

## 2021-01-07 LAB — I-STAT BETA HCG BLOOD, ED (MC, WL, AP ONLY): I-stat hCG, quantitative: <5 m[IU]/mL (ref <5)

## 2021-01-07 LAB — LIPASE, BLOOD: Lipase: 31 U/L (ref 11–51)

## 2021-01-07 MED ORDER — POTASSIUM CHLORIDE CRYS ER 20 MEQ PO TBCR
40.0000 meq | EXTENDED_RELEASE_TABLET | Freq: Once | ORAL | Status: AC
  Administered 2021-01-07: 40 meq via ORAL
  Filled 2021-01-07: qty 2

## 2021-01-07 MED ORDER — ONDANSETRON HCL 4 MG/2ML IJ SOLN
4.0000 mg | Freq: Once | INTRAMUSCULAR | Status: AC
  Administered 2021-01-07: 4 mg via INTRAVENOUS
  Filled 2021-01-07: qty 2

## 2021-01-07 MED ORDER — POTASSIUM CHLORIDE 10 MEQ/100ML IV SOLN
10.0000 meq | Freq: Once | INTRAVENOUS | Status: AC
  Administered 2021-01-07: 10 meq via INTRAVENOUS
  Filled 2021-01-07: qty 100

## 2021-01-07 MED ORDER — SODIUM CHLORIDE 0.9 % IV BOLUS
1000.0000 mL | Freq: Once | INTRAVENOUS | Status: AC
  Administered 2021-01-07: 1000 mL via INTRAVENOUS

## 2021-01-07 MED ORDER — ONDANSETRON 4 MG PO TBDP: 4.0000 mg | ORAL_TABLET | Freq: Three times a day (TID) | ORAL | 0 refills | Status: DC | PRN

## 2021-01-07 NOTE — ED Provider Notes (Signed)
[facility] DEPT Provider Note   CSN: [id number] Arrival date & time: 01/07/21  0845     History Chief Complaint  Patient presents with  . Diarrhea    Christina [name] is a 55 y.o. female.  55 year old female with prior medical history as detailed below presents for evaluation.  Patient reports significant loose watery diarrhea over the last 2 days.  Patient denies vomiting.  Patient is reporting significant decreased urine output.  She complains of feeling weak and fatigued as well.  She denies fever.  She is complaining of mild upper abdomen achy discomfort.  She denies noticing any blood in her stool.    The history is provided by the patient and medical records.  Diarrhea Quality:  Watery Severity:  Mild Onset quality:  Gradual Duration:  2 days Timing:  Constant Progression:  Unchanged Relieved by:  Nothing Worsened by:  Nothing Associated symptoms: no fever and no vomiting        Past Medical History:  Diagnosis Date  . Anemia   . Arthritis    hands and knees  . 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 Cataracts, bilateral   . Depression   . GAD (generalized anxiety disorder)   . Gallbladder problem   . History of ovarian cyst   . IBS (irritable bowel syndrome)   . Joint pain   . PONV (postoperative nausea and vomiting)    does well with scop patch  . Pre-diabetes    followed by pcp; on metformin for weight loss -Dr Mellody Dance  . Retinal detachment    Rheg OS  . Right knee meniscal tear   . Urgency of urination     Patient Active Problem List   Diagnosis Date Noted  . At risk for malnutrition 01/01/2021  . Obesity, Class I, BMI 30-34.9 12/31/2020  . Vitamin B12 deficiency 10/08/2020  . At risk for depression 10/08/2020  . Mood disorder ([place]), with emotional  eating 09/24/2020  . At risk for impaired metabolic function 18/84/1660  . At risk for diabetes mellitus 07/30/2020  . Vitamin D deficiency 07/02/2020  . Depression 07/02/2020  . At risk for side effect of medication 07/02/2020  . Stress due to illness of family member-  daughter with drug addiction 05/15/2020  . Depression, recurrent ([place]) 05/15/2020  . Anemia 05/15/2020  . Constipation 05/15/2020  . Prediabetes 05/15/2020  . Breast asymmetry following reconstructive surgery 04/24/2020  . Acquired absence of breast 04/24/2020  . S/P breast reconstruction, bilateral 04/24/2020  . Acute medial meniscus tear of left knee 12/20/2019  . Breast cancer, left ([place]) 01/09/2016  . Genetic testing 12/31/2015  . 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 Family history of breast cancer in female 11/08/2015  . Family history of colon cancer 11/08/2015  . Malignant neoplasm of central portion of left breast in female, estrogen receptor positive ([place]) 10/31/2015  . Endometriosis 03/11/2011    Past Surgical History:  Procedure Laterality Date  . ABDOMINAL HYSTERECTOMY  05/1996   endometriosis  . ACHILLES TENDON REPAIR Right 2010;  revision 2011  . BLADDER SUSPENSION  2000  . BREAST BIOPSY Left 10/2015  . BREAST IMPLANT REMOVAL Bilateral 08/12/2017   Procedure: REMOVAL BILATERAL BREAST IMPLANTS;  Surgeon: Wallace Going, DO;  Location: [facility];  Service: Plastics;  Laterality: Bilateral;  . BREAST RECONSTRUCTION WITH PLACEMENT OF TISSUE EXPANDER AND FLEX HD (ACELLULAR HYDRATED DERMIS) Bilateral 01/09/2016  . BREAST RECONSTRUCTION WITH PLACEMENT OF TISSUE EXPANDER AND FLEX HD (ACELLULAR HYDRATED DERMIS) Bilateral 01/09/2016   Procedure: BREAST RECONSTRUCTION WITH PLACEMENT OF TISSUE EXPANDER AND FLEX HD (ACELLULAR HYDRATED DERMIS);  Surgeon: Loel Lofty Dillingham, DO;  Location: [facility];  Service: Plastics;  Laterality: Bilateral;  . BREAST RECONSTRUCTION WITH PLACEMENT OF TISSUE  EXPANDER AND FLEX HD (ACELLULAR HYDRATED DERMIS) Bilateral 05/29/2016   Procedure: PLACEMENT OF BILATERAL TISSUE EXPANDER AND FLEX HD (ACELLULAR HYDRATED DERMIS);  Surgeon: Wallace Going, DO;  Location: [facility];  Service: Plastics;  Laterality: Bilateral;  . BREAST REDUCTION SURGERY Bilateral 11/26/2016   Procedure: BILATERAL BREAST CAPSULE CONTRACTURE RELASE;  Surgeon: Wallace Going, DO;  Location: [facility];  Service: Plastics;  Laterality: Bilateral;  . [place]; 1989; 1991  . ELBOW SURGERY Right x3   last one 02-01-2019 @ [facility]  . EYE SURGERY Left 10/01/2020   Pneumatic retinopexy for repair of rheg RD - Dr. Bernarda Caffey  . EYE SURGERY Left 10/04/2020   PPV - Dr. Bernarda Caffey  . FAT GRAFTING BILATERAL BREAST  08-09-2018  '@[facility]'   . GAS INSERTION Left 10/04/2020   Procedure: INSERTION OF GAS;  Surgeon: Bernarda Caffey, MD;  Location: [facility];  Service: Ophthalmology;  Laterality: Left;  Marland Kitchen GAS/FLUID EXCHANGE Left 10/04/2020   Procedure: GAS/FLUID EXCHANGE;  Surgeon: Bernarda Caffey, MD;  Location: [facility];  Service: Ophthalmology;  Laterality: Left;  . INCISION AND DRAINAGE OF WOUND Bilateral 02/11/2016   Procedure: IRRIGATION AND DEBRIDEMENT OF BILATERAL BREAST POCKET;  Surgeon: Wallace Going, DO;  Location: [facility];  Service: Plastics;  Laterality: Bilateral;  . KNEE ARTHROSCOPY WITH MEDIAL MENISECTOMY Right 12/20/2019   Procedure: KNEE ARTHROSCOPY WITH MEDIAL MENISECTOMY;  Surgeon: Marchia Bond, MD;  Location: [facility];  Service: Orthopedics;  Laterality: Right;  . LAPAROSCOPIC APPENDECTOMY  04-07-2011   '@[facility]'    w/ Excision peritoneal lipoma and lysis adhesions  . LAPAROSCOPIC CHOLECYSTECTOMY  ~ 1999  . LASER PHOTO ABLATION Right 10/04/2020   Procedure: LASER RETINOPEXY WITH INDIRECT LASER OPTHALMOSCOPE, RIGHT EYE;  Surgeon: Bernarda Caffey, MD;  Location: [facility];  Service: Ophthalmology;  Laterality: Right;   . LIPOSUCTION WITH LIPOFILLING Bilateral 11/26/2016   Procedure: LIPOFILLING FOR SYMMETRY;  Surgeon: Wallace Going, DO;  Location: [facility];  Service: Plastics;  Laterality: Bilateral;  . LIPOSUCTION WITH LIPOFILLING Bilateral 01/21/2017   Procedure: BILATERAL BREAST  LIPOFILLING FOR ASYMMETRY;  Surgeon: Wallace Going, DO;  Location: [facility];  Service: Plastics;  Laterality: Bilateral;  . LIPOSUCTION WITH LIPOFILLING Bilateral 06/28/2020   Procedure: Lipofilling bilateral breasts for asymmetry;  Surgeon: Wallace Going, DO;  Location: [facility];  Service: Plastics;  Laterality: Bilateral;  90 min  . MASTECTOMY Bilateral 01/09/2016  . NIPPLE SPARING MASTECTOMY/SENTINAL LYMPH NODE BIOPSY/RECONSTRUCTION/PLACEMENT OF TISSUE EXPANDER Bilateral 01/09/2016   Procedure: BILATERAL NIPPLE SPARING MASTECTOMY WITH LEFT SENTINAL LYMPH NODE BIOPSY ;  Surgeon: [facility] Klein, MD;  Location: [facility];  Service: General;  Laterality: Bilateral;  . PHOTOCOAGULATION WITH LASER Left 10/04/2020   Procedure: PHOTOCOAGULATION WITH LASER;  Surgeon: Bernarda Caffey, MD;  Location: [facility];  Service: Ophthalmology;  Laterality: Left;  . REMOVAL OF BILATERAL TISSUE EXPANDERS WITH PLACEMENT OF BILATERAL BREAST IMPLANTS Bilateral 08/20/2016   Procedure: REMOVAL OF BILATERAL TISSUE EXPANDERS WITH PLACEMENT OF BILATERAL  SILICONE IMPLANTS;  Surgeon: Wallace Going, DO;  Location: [facility];  Service: Plastics;  Laterality: Bilateral;  . REMOVAL OF BILATERAL TISSUE EXPANDERS WITH PLACEMENT OF BILATERAL BREAST IMPLANTS Bilateral 11/05/2017   Procedure: REMOVAL OF BILATERAL TISSUE EXPANDERS WITH PLACEMENT OF BILATERAL BREAST SILICONE IMPLANTS;  Surgeon: Wallace Going, DO;  Location: [facility];  Service: Plastics;  Laterality: Bilateral;  . REMOVAL OF TISSUE EXPANDER Bilateral 02/11/2016   Procedure: REMOVAL OF BILATERAL TISSUE EXPANDERS  AND FLEX HD REMOVAL;  Surgeon: Wallace Going, DO;  Location: [facility];  Service: Plastics;  Laterality: Bilateral;  . RETINAL DETACHMENT SURGERY Left 10/01/2020   Pneumatic retinopexy for repair of rheg RD - Dr. Bernarda Caffey  . RETINAL DETACHMENT SURGERY Left 10/04/2020   PPV - Dr. Bernarda Caffey  . SCLERAL BUCKLE Left 10/04/2020   Procedure: SCLERAL BUCKLE LEFT EYE;  Surgeon: Bernarda Caffey, MD;  Location: [facility];  Service: Ophthalmology;  Laterality: Left;  . TISSUE EXPANDER PLACEMENT Bilateral 08/12/2017   Procedure: PLACEMENT OF BILATERAL TISSUE EXPANDER;  Surgeon: Wallace Going, DO;  Location: [facility];  Service: Plastics;  Laterality: Bilateral;  . TUBAL LIGATION Bilateral 1991  . VITRECTOMY 25 GAUGE WITH SCLERAL BUCKLE Left 10/04/2020   Procedure: 25 GAUGE PARS PLANA VITRECTOMY LEFT EYE ;  Surgeon: Bernarda Caffey, MD;  Location: [facility];  Service: Ophthalmology;  Laterality: Left;     OB History    Gravida  4   Para  3   Term      Preterm      AB      Living  3     SAB      IAB      Ectopic      Multiple      Live Births              Family History  Problem Relation Age of Onset  . Heart failure Father   . Prostate cancer Father 17  . Retinal detachment Father   . Colon polyps Mother        approx 2  . Other Mother        hx HPV and hysterectomy due to precancerous cells  . Depression Mother   . Anxiety disorder Mother   . Other Sister 40       hx of hysterectomy for unspecified reason; still has ovaries  . Other Sister 80       paternal half-sister hx of hysterectomy for unspecified reason; still has ovaries  . Bladder Cancer Maternal Uncle 79       not a smoker  . Kidney failure Maternal Grandmother   . Congestive Heart Failure Maternal Grandmother   . Colon cancer Maternal Grandmother 53  . Diabetes Maternal Grandmother   . Lung cancer Maternal Grandfather 25       smoker  . Breast cancer Paternal  Grandmother        dx. early 73s; w/ hx of trauma to breast  . Crohn's disease Daughter   . Lung cancer Maternal Uncle 49       smoker    Social History   Tobacco Use  . Smoking status: Former Smoker    Packs/day: 1.00    Years: 10.00    Pack years: 10.00    Types: Cigarettes    Quit date: 06/08/2008    Years since quitting: 12.5  . Smokeless tobacco: Never Used  Vaping Use  .  Vaping Use: Never used  Substance Use Topics  . Alcohol use: No  . Drug use: Never    Home Medications Prior to Admission medications   Medication Sig Start Date End Date Taking? Authorizing Provider  ALPRAZolam Duanne Moron) 0.25 MG tablet Take 0.25 mg by mouth daily as needed for anxiety. 07/20/20   [provider]  amitriptyline (ELAVIL) 100 MG tablet Take 0.5 tablets (50 mg total) by mouth at bedtime. 07/02/20   Mellody Dance, DO  bacitracin-polymyxin b (POLYSPORIN) ophthalmic ointment Place into the left eye at bedtime. Place a 1/2 inch ribbon of ointment into the lower eyelid at bedtime and as needed 10/25/20   Bernarda Caffey, MD  FLUoxetine (PROZAC) 40 MG capsule Take 40 mg by mouth daily.    [provider]  HYDROcodone-acetaminophen (NORCO/VICODIN) 5-325 MG tablet Take 1 tablet by mouth every 4 (four) hours as needed for moderate pain. 10/04/20 10/04/21  Bernarda Caffey, MD  metFORMIN (GLUCOPHAGE) 500 MG tablet Take 1 tablet (500 mg total) by mouth 2 (two) times daily with a meal. 12/31/20   Opalski, Neoma Laming, DO  neomycin-polymyxin b-dexamethasone (MAXITROL) 3.5-10000-0.1 OINT Place 1 application into the left eye 4 (four) times daily. 10/01/20   Bernarda Caffey, MD  neomycin-polymyxin-hydrocortisone (CORTISPORIN) 3.5-10000-1 OTIC suspension Place into the right ear. 11/27/20   [provider]  prednisoLONE acetate (PRED FORTE) 1 % ophthalmic suspension Place 1 drop into the left eye 4 (four) times daily. 10/25/20   Bernarda Caffey, MD  Semaglutide,0.25 or 0.5MG/DOS, (OZEMPIC, 0.25 OR 0.5  MG/DOSE,) 2 MG/1.5ML SOPN Inject 0.5 mg into the skin once a week. 12/31/20   Mellody Dance, DO  topiramate (TOPAMAX) 50 MG tablet 1 po twice daily 12/31/20   Mellody Dance, DO  vitamin B-12 (CYANOCOBALAMIN) 500 MCG tablet Take 2 tablets (1,000 mcg total) by mouth daily. 12/03/20   Mellody Dance, DO  Vitamin D, Ergocalciferol, (DRISDOL) 1.25 MG (50000 UNIT) CAPS capsule 1 po q wed and 1 po q sun 12/31/20   Mellody Dance, DO    Allergies    Patient has no known allergies.  Review of Systems   Review of Systems  Constitutional: Negative for fever.  Gastrointestinal: Positive for diarrhea. Negative for vomiting.  All other systems reviewed and are negative.   Physical Exam Updated Vital Signs BP 114/84   Pulse 78   Temp 98.1 F (36.7 C) (Oral)   Resp 18   Ht '5\' 8"'  (1.727 m)   Wt 88.9 kg   LMP  (LMP Unknown)   SpO2 100%   BMI 29.80 kg/m   Physical Exam Vitals and nursing note reviewed.  Constitutional:      General: She is not in acute distress.    Appearance: Normal appearance. She is well-developed.  HENT:     Head: Normocephalic and atraumatic.  Eyes:     Conjunctiva/sclera: Conjunctivae normal.     Pupils: Pupils are equal, round, and reactive to light.  Cardiovascular:     Rate and Rhythm: Normal rate and regular rhythm.     Heart sounds: Normal heart sounds.  Pulmonary:     Effort: Pulmonary effort is normal. No respiratory distress.     Breath sounds: Normal breath sounds.  Abdominal:     General: There is no distension.     Palpations: Abdomen is soft.     Tenderness: There is no abdominal tenderness.  Musculoskeletal:        General: No deformity. Normal range of motion.     Cervical  back: Normal range of motion and neck supple.  Skin:    General: Skin is warm and dry.  Neurological:     Mental Status: She is alert and oriented to person, place, and time.     ED Results / Procedures / Treatments   Labs (all labs ordered are listed, but only  abnormal results are displayed) Labs Reviewed  CBC WITH DIFFERENTIAL/PLATELET - Abnormal; Notable for the following components:      Result Value   WBC 12.6 (*)    RBC 5.46 (*)    Hemoglobin 15.5 (*)    HCT 46.6 (*)    Neutro Abs 10.4 (*)    All other components within normal limits  COMPREHENSIVE METABOLIC PANEL - Abnormal; Notable for the following components:   Sodium 134 (*)    Potassium 2.8 (*)    Chloride 112 (*)    CO2 14 (*)    Glucose, Bld 130 (*)    BUN 26 (*)    Creatinine, Ser 1.05 (*)    AST 14 (*)    All other components within normal limits  URINALYSIS, ROUTINE W REFLEX MICROSCOPIC - Abnormal; Notable for the following components:   APPearance HAZY (*)    Hgb urine dipstick SMALL (*)    Protein, ur 30 (*)    Bacteria, UA FEW (*)    All other components within normal limits  RESP PANEL BY RT-PCR (FLU A&B, COVID) ARPGX2  LIPASE, BLOOD  I-STAT BETA HCG BLOOD, ED ([facility], [facility], [facility] ONLY)    EKG None  Radiology No results found.  Procedures Procedures   Medications Ordered in ED Medications  sodium chloride 0.9 % bolus 1,000 mL (has no administration in time range)  potassium chloride 10 mEq in 100 mL IVPB (has no administration in time range)  potassium chloride SA (KLOR-CON) CR tablet 40 mEq (has no administration in time range)  sodium chloride 0.9 % bolus 1,000 mL (1,000 mLs Intravenous New Bag/Given 01/07/21 1014)  ondansetron (ZOFRAN) injection 4 mg (4 mg Intravenous Given 01/07/21 1015)    ED Course  I have reviewed the triage vital signs and the nursing notes.  Pertinent labs & imaging results that were available during my care of the patient were reviewed by me and considered in my medical decision making (see chart for details).    MDM Rules/Calculators/A&P                          MDM  MSE complete  Christina [name] was evaluated in Emergency Department on 01/07/2021 for the symptoms described in the history of present illness. She was evaluated in  the context of the global COVID-19 pandemic, which necessitated consideration that the patient might be at risk for infection with the SARS-CoV-2 virus that causes COVID-19. Institutional protocols and algorithms that pertain to the evaluation of patients at risk for COVID-19 are in a state of rapid change based on information released by regulatory bodies including the CDC and federal and state organizations. These policies and algorithms were followed during the patient's care in the ED.  Patient presented for evaluation of reported diarrheal illness.  Symptoms are most consistent with likely viral process.  Patient is dehydrated on evaluation and on labs.  Patient is given IV fluids.  Potassium repleted.  Prior to discharge patient is improved.  She understands need for close follow-up.  She desires discharge at this time.  Repeat abdominal exams benign.  Strict return  precautions given and understood.   Final Clinical Impression(s) / ED Diagnoses Final diagnoses:  Diarrhea, unspecified type    Rx / DC Orders ED Discharge Orders         Ordered    ondansetron (ZOFRAN ODT) 4 MG disintegrating tablet  Every 8 hours PRN        01/07/21 1626           Valarie Merino, MD 01/07/21 1627

## 2021-01-07 NOTE — ED Notes (Signed)
Patient ambulatory to BR for UA. No assistance needed.

## 2021-01-07 NOTE — ED Provider Notes (Signed)
Patient placed in Quick Look pathway, seen and evaluated   Chief Complaint: Diarrhea, nausea.  HPI: 55 year old reportedly otherwise healthy female presented for diarrhea x2 days.  Patient reports multiple episodes of watery, nonbloody diarrhea associated with nausea without vomiting.  Occasional epigastric abdominal pain.  Denies fever/chills, chest pain, shortness of breath, cough, dysuria/hematuria, fall/injury, sick contacts or any additional concerns  ROS: Positive abdominal pain diarrhea nausea.  Negative fever/chills, chest pain/shortness of breath, cough, dysuria/hematuria prior  Physical Exam:   Gen: No distress  Neuro: Awake and Alert  Skin: Warm    Focused Exam: Abdomen soft nontender without peritoneal signs.   Initiation of care has begun. The patient has been counseled on the process, plan, and necessity for staying for the completion/evaluation, and the remainder of the medical screening examination   Note: Portions of this report may have been transcribed using voice recognition software. Every effort was made to ensure accuracy; however, inadvertent computerized transcription errors may still be present   Gari Crown 01/07/21 8144    Milton Ferguson, MD 01/08/21 [phone]

## 2021-01-07 NOTE — ED Notes (Signed)
Pt ambulatory to the bathrrom Unable to collect urine sample Will attempt at a later time

## 2021-01-07 NOTE — ED Notes (Signed)
Patient made aware we need UA. Patient will call out when ready to void. Call bell at bedside. Pt A/Ox4 and ambulatory with no assistance.

## 2021-01-07 NOTE — ED Notes (Addendum)
Pts family outside room requesting update from MD. MD [facility] made aware at this time.

## 2021-01-07 NOTE — ED Triage Notes (Addendum)
Pt arrived via walk in, c/o diarrhea since sat morning, denies any n/v. States he appetite has been poor. Denies any urinary issues or sick contacts.

## 2021-01-07 NOTE — Discharge Instructions (Signed)
Please return for any problem.  °

## 2021-01-08 NOTE — Progress Notes (Shared)
[facility] Note  01/10/2021     CHIEF COMPLAINT Patient presents for No chief complaint on file.   HISTORY OF PRESENT ILLNESS: Christina [name] is a 55 y.o. female who presents to the clinic today for:  pt states her gas bubble is the size of a BB, she is back to work full time   Referring physician: Maurice Small, MD [facility] [address],  [place] [address]  HISTORICAL INFORMATION:    Selected notes from the [facility] Referred by Dr. Quentin Ore for RD OS   CURRENT MEDICATIONS: Current Outpatient Medications (Ophthalmic Drugs)  Medication Sig  . bacitracin-polymyxin b (POLYSPORIN) ophthalmic ointment Place into the left eye at bedtime. Place a 1/2 inch ribbon of ointment into the lower eyelid at bedtime and as needed  . neomycin-polymyxin b-dexamethasone (MAXITROL) 3.5-10000-0.1 OINT Place 1 application into the left eye 4 (four) times daily.  . prednisoLONE acetate (PRED FORTE) 1 % ophthalmic suspension Place 1 drop into the left eye 4 (four) times daily.   No current facility-administered medications for this visit. (Ophthalmic Drugs)   Current Outpatient Medications (Other)  Medication Sig  . ALPRAZolam (XANAX) 0.25 MG tablet Take 0.25 mg by mouth daily as needed for anxiety.  Marland Kitchen amitriptyline (ELAVIL) 100 MG tablet Take 0.5 tablets (50 mg total) by mouth at bedtime.  Marland Kitchen FLUoxetine (PROZAC) 40 MG capsule Take 40 mg by mouth daily.  Marland Kitchen HYDROcodone-acetaminophen (NORCO/VICODIN) 5-325 MG tablet Take 1 tablet by mouth every 4 (four) hours as needed for moderate pain.  . metFORMIN (GLUCOPHAGE) 500 MG tablet Take 1 tablet (500 mg total) by mouth 2 (two) times daily with a meal.  . neomycin-polymyxin-hydrocortisone (CORTISPORIN) 3.5-10000-1 OTIC suspension Place into the right ear.  . ondansetron (ZOFRAN ODT) 4 MG disintegrating tablet Take 1 tablet (4 mg total) by mouth every 8 (eight) hours as needed for nausea or vomiting.   . Semaglutide,0.25 or 0.5MG/DOS, (OZEMPIC, 0.25 OR 0.5 MG/DOSE,) 2 MG/1.5ML SOPN Inject 0.5 mg into the skin once a week.  . topiramate (TOPAMAX) 50 MG tablet 1 po twice daily  . vitamin B-12 (CYANOCOBALAMIN) 500 MCG tablet Take 2 tablets (1,000 mcg total) by mouth daily.  . Vitamin D, Ergocalciferol, (DRISDOL) 1.25 MG (50000 UNIT) CAPS capsule 1 po q wed and 1 po q sun   No current facility-administered medications for this visit. (Other)      REVIEW OF SYSTEMS:    ALLERGIES No Known Allergies  PAST MEDICAL HISTORY Past Medical History:  Diagnosis Date  . Anemia   . Arthritis    hands and knees  . 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 Cataracts, bilateral   . Depression   . GAD (generalized anxiety disorder)   . Gallbladder problem   . History of ovarian cyst   . IBS (irritable bowel syndrome)   . Joint pain   . PONV (postoperative nausea and vomiting)    does well with scop patch  . Pre-diabetes    followed by pcp; on metformin for weight loss -Dr Mellody Dance  . Retinal detachment    Rheg OS  . Right knee meniscal tear   . Urgency of urination    Past Surgical History:  Procedure Laterality Date  . ABDOMINAL HYSTERECTOMY  05/1996   endometriosis  .  ACHILLES TENDON REPAIR Right 2010;  revision 2011  . BLADDER SUSPENSION  2000  . BREAST BIOPSY Left 10/2015  . BREAST IMPLANT REMOVAL Bilateral 08/12/2017   Procedure: REMOVAL BILATERAL BREAST IMPLANTS;  Surgeon: Wallace Going, DO;  Location: [facility];  Service: Plastics;  Laterality: Bilateral;  . BREAST RECONSTRUCTION WITH PLACEMENT OF TISSUE EXPANDER AND FLEX HD (ACELLULAR HYDRATED DERMIS) Bilateral 01/09/2016  . BREAST RECONSTRUCTION WITH PLACEMENT OF TISSUE EXPANDER AND FLEX HD (ACELLULAR HYDRATED DERMIS) Bilateral 01/09/2016    Procedure: BREAST RECONSTRUCTION WITH PLACEMENT OF TISSUE EXPANDER AND FLEX HD (ACELLULAR HYDRATED DERMIS);  Surgeon: Loel Lofty Dillingham, DO;  Location: [facility];  Service: Plastics;  Laterality: Bilateral;  . BREAST RECONSTRUCTION WITH PLACEMENT OF TISSUE EXPANDER AND FLEX HD (ACELLULAR HYDRATED DERMIS) Bilateral 05/29/2016   Procedure: PLACEMENT OF BILATERAL TISSUE EXPANDER AND FLEX HD (ACELLULAR HYDRATED DERMIS);  Surgeon: Wallace Going, DO;  Location: [facility];  Service: Plastics;  Laterality: Bilateral;  . BREAST REDUCTION SURGERY Bilateral 11/26/2016   Procedure: BILATERAL BREAST CAPSULE CONTRACTURE RELASE;  Surgeon: Wallace Going, DO;  Location: [facility];  Service: Plastics;  Laterality: Bilateral;  . [place]; 1989; 1991  . ELBOW SURGERY Right x3   last one 02-01-2019 @ [facility]  . EYE SURGERY Left 10/01/2020   Pneumatic retinopexy for repair of rheg RD - Dr. Bernarda Caffey  . EYE SURGERY Left 10/04/2020   PPV - Dr. Bernarda Caffey  . FAT GRAFTING BILATERAL BREAST  08-09-2018  _0   . GAS INSERTION Left 10/04/2020   Procedure: INSERTION OF GAS;  Surgeon: Bernarda Caffey, MD;  Location: [facility];  Service: Ophthalmology;  Laterality: Left;  Marland Kitchen GAS/FLUID EXCHANGE Left 10/04/2020   Procedure: GAS/FLUID EXCHANGE;  Surgeon: Bernarda Caffey, MD;  Location: [facility];  Service: Ophthalmology;  Laterality: Left;  . INCISION AND DRAINAGE OF WOUND Bilateral 02/11/2016   Procedure: IRRIGATION AND DEBRIDEMENT OF BILATERAL BREAST POCKET;  Surgeon: Wallace Going, DO;  Location: [facility];  Service: Plastics;  Laterality: Bilateral;  . KNEE ARTHROSCOPY WITH MEDIAL MENISECTOMY Right 12/20/2019   Procedure: KNEE ARTHROSCOPY WITH MEDIAL MENISECTOMY;  Surgeon: Marchia Bond, MD;  Location: [facility];  Service: Orthopedics;  Laterality: Right;  . LAPAROSCOPIC APPENDECTOMY  04-07-2011   _1    w/ Excision peritoneal lipoma and lysis  adhesions  . LAPAROSCOPIC CHOLECYSTECTOMY  ~ 1999  . LASER PHOTO ABLATION Right 10/04/2020   Procedure: LASER RETINOPEXY WITH INDIRECT LASER OPTHALMOSCOPE, RIGHT EYE;  Surgeon: Bernarda Caffey, MD;  Location: [facility];  Service: Ophthalmology;  Laterality: Right;  . LIPOSUCTION WITH LIPOFILLING Bilateral 11/26/2016   Procedure: LIPOFILLING FOR SYMMETRY;  Surgeon: Wallace Going, DO;  Location: [place] [facility];  Service: Plastics;  Laterality: Bilateral;  . LIPOSUCTION WITH LIPOFILLING Bilateral 01/21/2017   Procedure: BILATERAL BREAST  LIPOFILLING FOR ASYMMETRY;  Surgeon: Wallace Going, DO;  Location: [facility];  Service: Plastics;  Laterality: Bilateral;  . LIPOSUCTION WITH LIPOFILLING Bilateral 06/28/2020   Procedure: Lipofilling bilateral breasts for asymmetry;  Surgeon: Wallace Going, DO;  Location: [facility];  Service: Plastics;  Laterality: Bilateral;  90 min  . MASTECTOMY Bilateral 01/09/2016  . NIPPLE SPARING MASTECTOMY/SENTINAL LYMPH NODE BIOPSY/RECONSTRUCTION/PLACEMENT OF TISSUE EXPANDER Bilateral 01/09/2016   Procedure: BILATERAL NIPPLE SPARING MASTECTOMY WITH LEFT SENTINAL LYMPH NODE BIOPSY ;  Surgeon: Stark Klein, MD;  Location: [facility];  Service: General;  Laterality: Bilateral;  . PHOTOCOAGULATION WITH LASER  Left 10/04/2020   Procedure: PHOTOCOAGULATION WITH LASER;  Surgeon: Bernarda Caffey, MD;  Location: [facility];  Service: Ophthalmology;  Laterality: Left;  . REMOVAL OF BILATERAL TISSUE EXPANDERS WITH PLACEMENT OF BILATERAL BREAST IMPLANTS Bilateral 08/20/2016   Procedure: REMOVAL OF BILATERAL TISSUE EXPANDERS WITH PLACEMENT OF BILATERAL SILICONE IMPLANTS;  Surgeon: Wallace Going, DO;  Location: [facility];  Service: Plastics;  Laterality: Bilateral;  . REMOVAL OF BILATERAL TISSUE EXPANDERS WITH PLACEMENT OF BILATERAL BREAST IMPLANTS Bilateral 11/05/2017   Procedure: REMOVAL OF BILATERAL TISSUE EXPANDERS WITH PLACEMENT  OF BILATERAL BREAST SILICONE IMPLANTS;  Surgeon: Wallace Going, DO;  Location: [facility];  Service: Plastics;  Laterality: Bilateral;  . REMOVAL OF TISSUE EXPANDER Bilateral 02/11/2016   Procedure: REMOVAL OF BILATERAL TISSUE EXPANDERS AND FLEX HD REMOVAL;  Surgeon: Wallace Going, DO;  Location: [facility];  Service: Plastics;  Laterality: Bilateral;  . RETINAL DETACHMENT SURGERY Left 10/01/2020   Pneumatic retinopexy for repair of rheg RD - Dr. Bernarda Caffey  . RETINAL DETACHMENT SURGERY Left 10/04/2020   PPV - Dr. Bernarda Caffey  . SCLERAL BUCKLE Left 10/04/2020   Procedure: SCLERAL BUCKLE LEFT EYE;  Surgeon: Bernarda Caffey, MD;  Location: [facility];  Service: Ophthalmology;  Laterality: Left;  . TISSUE EXPANDER PLACEMENT Bilateral 08/12/2017   Procedure: PLACEMENT OF BILATERAL TISSUE EXPANDER;  Surgeon: Wallace Going, DO;  Location: [facility];  Service: Plastics;  Laterality: Bilateral;  . TUBAL LIGATION Bilateral 1991  . VITRECTOMY 25 GAUGE WITH SCLERAL BUCKLE Left 10/04/2020   Procedure: 25 GAUGE PARS PLANA VITRECTOMY LEFT EYE ;  Surgeon: Bernarda Caffey, MD;  Location: [facility];  Service: Ophthalmology;  Laterality: Left;    FAMILY HISTORY Family History  Problem Relation Age of Onset  . Heart failure Father   . Prostate cancer Father 4  . Retinal detachment Father   . Colon polyps Mother        approx 2  . Other Mother        hx HPV and hysterectomy due to precancerous cells  . Depression Mother   . Anxiety disorder Mother   . Other Sister 53       hx of hysterectomy for unspecified reason; still has ovaries  . Other Sister 68       paternal half-sister hx of hysterectomy for unspecified reason; still has ovaries  . Bladder Cancer Maternal Uncle 79       not a smoker  . Kidney failure Maternal Grandmother   . Congestive Heart Failure Maternal Grandmother   . Colon cancer Maternal Grandmother 71  . Diabetes Maternal  Grandmother   . Lung cancer Maternal Grandfather 74       smoker  . Breast cancer Paternal Grandmother        dx. early 44s; w/ hx of trauma to breast  . Crohn's disease Daughter   . Lung cancer Maternal Uncle 40       smoker    SOCIAL HISTORY Social History   Tobacco Use  . Smoking status: Former Smoker    Packs/day: 1.00    Years: 10.00    Pack years: 10.00    Types: Cigarettes    Quit date: 06/08/2008    Years since quitting: 12.5  . Smokeless tobacco: Never Used  Vaping Use  . Vaping Use: Never used  Substance Use Topics  . Alcohol use: No  . Drug use: Never  OPHTHALMIC EXAM:  Not recorded     IMAGING AND PROCEDURES  Imaging and Procedures for 01/10/2021           ASSESSMENT/PLAN:    ICD-10-CM   1. Left retinal detachment  H33.22   2. Retinal edema  H35.81   3. Retinal hole of right eye  H33.321   4. Diabetes mellitus type 2 without retinopathy ([place])  E11.9   5. Combined forms of age-related cataract of both eyes  H25.813     1,2. 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 Retinal hole, OD - small operculated hole at 0430 - s/p laser retinopexy OD (01.27.22) in OR -- good laser in place  4. Diabetes mellitus, type 2 without retinopathy - The incidence, risk factors for progression, natural history and treatment  options for diabetic retinopathy  were discussed with patient.   - The need for close monitoring of blood glucose, blood pressure, and serum lipids, avoiding cigarette or any type of tobacco, and the need for long term follow up was also discussed with patient. - monitor  5. Mixed Cataract OU - The symptoms of cataract, surgical options, and treatments and risks were discussed with patient. - discussed diagnosis and progression - progression of PSC OS post vitrectomy -- will likely need surgery once stable from RD repair - unable to refer back to Dr. Kathlen Mody due to [facility]  - will refer to [facility] for cat eval  Ophthalmic Meds Ordered this visit:  No orders of the defined types were placed in this encounter.     No follow-ups on file.  There are no Patient Instructions on file for this visit.   Explained the diagnoses, plan, and follow up with the patient and they expressed understanding.  Patient expressed understanding of the importance of proper follow up care.   This document serves as a record of services personally performed by Gardiner Sleeper, MD, PhD. It was created on their behalf by Estill Bakes, COT an ophthalmic technician. The creation of this record is the provider's dictation and/or activities during the visit.    This document serves as a record of services personally performed by Gardiner Sleeper, MD, PhD. It was created on their behalf by Leonie Douglas, an ophthalmic technician. The creation of this record is the provider's dictation and/or activities during the visit.    Electronically signed by: Leonie Douglas COA, 01/08/21  12:56 PM   Gardiner Sleeper, M.D., Ph.D. Diseases & Surgery of the Retina and Vitreous  [place] 01/08/2021    Abbreviations: M myopia (nearsighted); A astigmatism; H hyperopia (farsighted); P presbyopia; Mrx spectacle prescription;  CTL contact lenses; OD right eye; OS left eye; OU both  eyes  XT exotropia; ET esotropia; PEK punctate epithelial keratitis; PEE punctate epithelial erosions; DES dry eye syndrome; MGD meibomian gland dysfunction; ATs artificial tears; PFAT's preservative free artificial tears; Brandt nuclear sclerotic cataract; PSC posterior subcapsular cataract; ERM epi-retinal membrane; PVD posterior vitreous detachment; RD retinal detachment; DM diabetes mellitus; DR diabetic retinopathy; NPDR non-proliferative diabetic retinopathy; PDR proliferative diabetic retinopathy; CSME clinically significant macular edema; DME diabetic macular edema; dbh dot blot hemorrhages; CWS cotton wool spot; POAG primary open angle glaucoma; C/D cup-to-disc ratio; HVF humphrey visual field; GVF goldmann visual field; OCT optical coherence tomography; IOP intraocular pressure; BRVO Branch retinal vein occlusion; CRVO central retinal vein occlusion; CRAO central retinal artery occlusion; BRAO branch retinal artery occlusion; RT retinal tear; SB scleral buckle; PPV pars plana vitrectomy; VH Vitreous hemorrhage; PRP panretinal laser photocoagulation; IVK intravitreal kenalog; VMT vitreomacular traction; MH Macular hole;  NVD neovascularization of the disc; NVE neovascularization elsewhere; AREDS age related eye disease study; ARMD age related macular degeneration; POAG primary open angle glaucoma; EBMD epithelial/anterior basement membrane dystrophy; ACIOL anterior chamber intraocular lens; IOL intraocular lens; PCIOL posterior chamber intraocular lens; Phaco/IOL phacoemulsification with intraocular lens placement; Opal photorefractive keratectomy; LASIK laser assisted in situ keratomileusis; HTN hypertension; DM diabetes mellitus; COPD chronic obstructive pulmonary disease

## 2021-01-10 ENCOUNTER — Other Ambulatory Visit: Payer: Self-pay

## 2021-01-10 ENCOUNTER — Encounter (INDEPENDENT_AMBULATORY_CARE_PROVIDER_SITE_OTHER): Payer: 59 | Admitting: Ophthalmology

## 2021-01-10 ENCOUNTER — Encounter (INDEPENDENT_AMBULATORY_CARE_PROVIDER_SITE_OTHER): Payer: Self-pay

## 2021-01-10 DIAGNOSIS — H25813 Combined forms of age-related cataract, bilateral: Secondary | ICD-10-CM

## 2021-01-10 DIAGNOSIS — E119 Type 2 diabetes mellitus without complications: Secondary | ICD-10-CM

## 2021-01-10 DIAGNOSIS — H3581 Retinal edema: Secondary | ICD-10-CM

## 2021-01-10 DIAGNOSIS — H33321 Round hole, right eye: Secondary | ICD-10-CM

## 2021-01-10 DIAGNOSIS — H3322 Serous retinal detachment, left eye: Secondary | ICD-10-CM

## 2021-01-11 ENCOUNTER — Telehealth: Payer: Self-pay | Admitting: Hematology and Oncology

## 2021-01-11 NOTE — Telephone Encounter (Signed)
R/s appt per 5/5 sch msg. Pt aware.  

## 2021-01-14 NOTE — Progress Notes (Signed)
[facility] Note  01/17/2021     CHIEF COMPLAINT Patient presents for Retina Follow Up   HISTORY OF PRESENT ILLNESS: Christina [name] is a 55 y.o. female who presents to the clinic today for:  HPI    Retina Follow Up    Patient presents with  Retinal Break/Detachment.  In left eye.  This started 5 weeks ago.  Since onset it is stable.  I, the attending physician,  performed the HPI with the patient and updated documentation appropriately.          Comments    Pt here for 5 wk retinal follow up RD OS, h/o Ret hole OD. Pt states vision has gotten better. Pt had cataract sx on OS. Sees much better. Still wearing old specs. OD has not yet been scheduled.         Last edited by Bernarda Caffey, MD on 01/17/2021  4:19 PM. (History)    pt states   Referring physician: Maurice Small, MD [address],  [place] [address]  HISTORICAL INFORMATION:    Selected notes from the MEDICAL RECORD NUMBER Referred by Dr. Quentin Ore for RD OS   CURRENT MEDICATIONS: Current Outpatient Medications (Ophthalmic Drugs)  Medication Sig  . bacitracin-polymyxin b (POLYSPORIN) ophthalmic ointment Place into the left eye at bedtime. Place a 1/2 inch ribbon of ointment into the lower eyelid at bedtime and as needed  . neomycin-polymyxin b-dexamethasone (MAXITROL) 3.5-10000-0.1 OINT Place 1 application into the left eye 4 (four) times daily.  . prednisoLONE acetate (PRED FORTE) 1 % ophthalmic suspension Place 1 drop into the left eye 4 (four) times daily.   No current facility-administered medications for this visit. (Ophthalmic Drugs)   Current Outpatient Medications (Other)  Medication Sig  . ALPRAZolam (XANAX) 0.25 MG tablet Take 0.25 mg by mouth daily as needed for anxiety.  Marland Kitchen amitriptyline (ELAVIL) 100 MG tablet Take 0.5 tablets (50 mg total) by mouth at bedtime.  Marland Kitchen FLUoxetine (PROZAC) 40 MG capsule Take 40 mg by mouth daily.  Marland Kitchen  HYDROcodone-acetaminophen (NORCO/VICODIN) 5-325 MG tablet Take 1 tablet by mouth every 4 (four) hours as needed for moderate pain.  . metFORMIN (GLUCOPHAGE) 500 MG tablet Take 1 tablet (500 mg total) by mouth 2 (two) times daily with a meal.  . neomycin-polymyxin-hydrocortisone (CORTISPORIN) 3.5-10000-1 OTIC suspension Place into the right ear.  . ondansetron (ZOFRAN ODT) 4 MG disintegrating tablet Take 1 tablet (4 mg total) by mouth every 8 (eight) hours as needed for nausea or vomiting.  . Semaglutide,0.25 or 0.5MG/DOS, (OZEMPIC, 0.25 OR 0.5 MG/DOSE,) 2 MG/1.5ML SOPN Inject 0.5 mg into the skin once a week.  . tamoxifen (NOLVADEX) 20 MG tablet Take 1 tablet (20 mg total) by mouth daily.  Marland Kitchen topiramate (TOPAMAX) 50 MG tablet 1 po twice daily  . vitamin B-12 (CYANOCOBALAMIN) 500 MCG tablet Take 2 tablets (1,000 mcg total) by mouth daily.  . Vitamin D, Ergocalciferol, (DRISDOL) 1.25 MG (50000 UNIT) CAPS capsule 1 po q wed and 1 po q sun   No current facility-administered medications for this visit. (Other)      REVIEW OF SYSTEMS: ROS    Positive for: Cardiovascular, Eyes, Psychiatric, Heme/Lymph   Negative for: Constitutional, Gastrointestinal, Neurological, Skin, Genitourinary, Musculoskeletal, HENT, Endocrine, Respiratory, Allergic/Imm   Last edited by Kingsley Spittle, COT on 01/17/2021  2:53 PM. (History)       ALLERGIES No Known Allergies  PAST MEDICAL HISTORY Past Medical History:  Diagnosis Date  . Anemia   . Arthritis    hands and knees  . 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 Cataracts, bilateral   . Depression   . GAD (generalized anxiety disorder)   . Gallbladder problem   . History of ovarian cyst   . IBS (irritable bowel syndrome)   . Joint pain   . PONV (postoperative nausea and vomiting)    does well with  scop patch  . Pre-diabetes    followed by pcp; on metformin for weight loss -Dr Mellody Dance  . Retinal detachment    Rheg OS  . Right knee meniscal tear   . Urgency of urination    Past Surgical History:  Procedure Laterality Date  . ABDOMINAL HYSTERECTOMY  05/1996   endometriosis  . ACHILLES TENDON REPAIR Right 2010;  revision 2011  . BLADDER SUSPENSION  2000  . BREAST BIOPSY Left 10/2015  . BREAST IMPLANT REMOVAL Bilateral 08/12/2017   Procedure: REMOVAL BILATERAL BREAST IMPLANTS;  Surgeon: Wallace Going, DO;  Location: [facility];  Service: Plastics;  Laterality: Bilateral;  . BREAST RECONSTRUCTION WITH PLACEMENT OF TISSUE EXPANDER AND FLEX HD (ACELLULAR HYDRATED DERMIS) Bilateral 01/09/2016  . BREAST RECONSTRUCTION WITH PLACEMENT OF TISSUE EXPANDER AND FLEX HD (ACELLULAR HYDRATED DERMIS) Bilateral 01/09/2016   Procedure: BREAST RECONSTRUCTION WITH PLACEMENT OF TISSUE EXPANDER AND FLEX HD (ACELLULAR HYDRATED DERMIS);  Surgeon: Loel Lofty Dillingham, DO;  Location: [facility];  Service: Plastics;  Laterality: Bilateral;  . BREAST RECONSTRUCTION WITH PLACEMENT OF TISSUE EXPANDER AND FLEX HD (ACELLULAR HYDRATED DERMIS) Bilateral 05/29/2016   Procedure: PLACEMENT OF BILATERAL TISSUE EXPANDER AND FLEX HD (ACELLULAR HYDRATED DERMIS);  Surgeon: Wallace Going, DO;  Location: [facility];  Service: Plastics;  Laterality: Bilateral;  . BREAST REDUCTION SURGERY Bilateral 11/26/2016   Procedure: BILATERAL BREAST CAPSULE CONTRACTURE RELASE;  Surgeon: Wallace Going, DO;  Location: [facility];  Service: Plastics;  Laterality: Bilateral;  . [place]; 1989; 1991  . ELBOW SURGERY Right x3   last one 02-01-2019 @ [facility]  . EYE SURGERY Left 10/01/2020   Pneumatic retinopexy for repair of rheg RD - Dr. Bernarda Caffey  . EYE SURGERY Left 10/04/2020   PPV - Dr. Bernarda Caffey  . FAT GRAFTING BILATERAL BREAST  08-09-2018  _0   . GAS INSERTION  Left 10/04/2020   Procedure: INSERTION OF GAS;  Surgeon: Bernarda Caffey, MD;  Location: [facility];  Service: Ophthalmology;  Laterality: Left;  Marland Kitchen GAS/FLUID EXCHANGE Left 10/04/2020   Procedure: GAS/FLUID EXCHANGE;  Surgeon: Bernarda Caffey, MD;  Location: [facility];  Service: Ophthalmology;  Laterality: Left;  . INCISION AND DRAINAGE OF WOUND Bilateral 02/11/2016   Procedure: IRRIGATION AND DEBRIDEMENT OF BILATERAL BREAST POCKET;  Surgeon: Wallace Going, DO;  Location: [facility];  Service: Plastics;  Laterality: Bilateral;  . KNEE ARTHROSCOPY WITH MEDIAL MENISECTOMY Right 12/20/2019   Procedure: KNEE ARTHROSCOPY WITH MEDIAL MENISECTOMY;  Surgeon: Marchia Bond, MD;  Location: [facility];  Service: Orthopedics;  Laterality: Right;  . LAPAROSCOPIC APPENDECTOMY  04-07-2011   _1    w/ Excision peritoneal lipoma and lysis adhesions  . LAPAROSCOPIC CHOLECYSTECTOMY  ~ 1999  . LASER PHOTO ABLATION Right 10/04/2020   Procedure: LASER RETINOPEXY WITH INDIRECT LASER OPTHALMOSCOPE, RIGHT EYE;  Surgeon: Bernarda Caffey,  MD;  Location: [facility];  Service: Ophthalmology;  Laterality: Right;  . LIPOSUCTION WITH LIPOFILLING Bilateral 11/26/2016   Procedure: LIPOFILLING FOR SYMMETRY;  Surgeon: Wallace Going, DO;  Location: [facility];  Service: Plastics;  Laterality: Bilateral;  . LIPOSUCTION WITH LIPOFILLING Bilateral 01/21/2017   Procedure: BILATERAL BREAST  LIPOFILLING FOR ASYMMETRY;  Surgeon: Wallace Going, DO;  Location: [facility];  Service: Plastics;  Laterality: Bilateral;  . LIPOSUCTION WITH LIPOFILLING Bilateral 06/28/2020   Procedure: Lipofilling bilateral breasts for asymmetry;  Surgeon: Wallace Going, DO;  Location: [facility];  Service: Plastics;  Laterality: Bilateral;  90 min  . MASTECTOMY Bilateral 01/09/2016  . NIPPLE SPARING MASTECTOMY/SENTINAL LYMPH NODE BIOPSY/RECONSTRUCTION/PLACEMENT OF TISSUE EXPANDER Bilateral  01/09/2016   Procedure: BILATERAL NIPPLE SPARING MASTECTOMY WITH LEFT SENTINAL LYMPH NODE BIOPSY ;  Surgeon: Stark Klein, MD;  Location: [facility];  Service: General;  Laterality: Bilateral;  . PHOTOCOAGULATION WITH LASER Left 10/04/2020   Procedure: PHOTOCOAGULATION WITH LASER;  Surgeon: Bernarda Caffey, MD;  Location: [facility];  Service: Ophthalmology;  Laterality: Left;  . REMOVAL OF BILATERAL TISSUE EXPANDERS WITH PLACEMENT OF BILATERAL BREAST IMPLANTS Bilateral 08/20/2016   Procedure: REMOVAL OF BILATERAL TISSUE EXPANDERS WITH PLACEMENT OF BILATERAL SILICONE IMPLANTS;  Surgeon: Wallace Going, DO;  Location: [facility];  Service: Plastics;  Laterality: Bilateral;  . REMOVAL OF BILATERAL TISSUE EXPANDERS WITH PLACEMENT OF BILATERAL BREAST IMPLANTS Bilateral 11/05/2017   Procedure: REMOVAL OF BILATERAL TISSUE EXPANDERS WITH PLACEMENT OF BILATERAL BREAST SILICONE IMPLANTS;  Surgeon: Wallace Going, DO;  Location: [facility];  Service: Plastics;  Laterality: Bilateral;  . REMOVAL OF TISSUE EXPANDER Bilateral 02/11/2016   Procedure: REMOVAL OF BILATERAL TISSUE EXPANDERS AND FLEX HD REMOVAL;  Surgeon: Wallace Going, DO;  Location: [facility];  Service: Plastics;  Laterality: Bilateral;  . RETINAL DETACHMENT SURGERY Left 10/01/2020   Pneumatic retinopexy for repair of rheg RD - Dr. Bernarda Caffey  . RETINAL DETACHMENT SURGERY Left 10/04/2020   PPV - Dr. Bernarda Caffey  . SCLERAL BUCKLE Left 10/04/2020   Procedure: SCLERAL BUCKLE LEFT EYE;  Surgeon: Bernarda Caffey, MD;  Location: [facility];  Service: Ophthalmology;  Laterality: Left;  . TISSUE EXPANDER PLACEMENT Bilateral 08/12/2017   Procedure: PLACEMENT OF BILATERAL TISSUE EXPANDER;  Surgeon: Wallace Going, DO;  Location: [facility];  Service: Plastics;  Laterality: Bilateral;  . TUBAL LIGATION Bilateral 1991  . VITRECTOMY 25 GAUGE WITH SCLERAL BUCKLE Left 10/04/2020   Procedure: 25 GAUGE  PARS PLANA VITRECTOMY LEFT EYE ;  Surgeon: Bernarda Caffey, MD;  Location: [facility];  Service: Ophthalmology;  Laterality: Left;    FAMILY HISTORY Family History  Problem Relation Age of Onset  . Heart failure Father   . Prostate cancer Father 52  . Retinal detachment Father   . Colon polyps Mother        approx 2  . Other Mother        hx HPV and hysterectomy due to precancerous cells  . Depression Mother   . Anxiety disorder Mother   . Other Sister 31       hx of hysterectomy for unspecified reason; still has ovaries  . Other Sister 28       paternal half-sister hx of hysterectomy for unspecified reason; still has ovaries  . Bladder Cancer Maternal Uncle 79       not a smoker  . Kidney failure Maternal Grandmother   .  Congestive Heart Failure Maternal Grandmother   . Colon cancer Maternal Grandmother 28  . Diabetes Maternal Grandmother   . Lung cancer Maternal Grandfather 22       smoker  . Breast cancer Paternal Grandmother        dx. early 47s; w/ hx of trauma to breast  . Crohn's disease Daughter   . Lung cancer Maternal Uncle 34       smoker    SOCIAL HISTORY Social History   Tobacco Use  . Smoking status: Former Smoker    Packs/day: 1.00    Years: 10.00    Pack years: 10.00    Types: Cigarettes    Quit date: 06/08/2008    Years since quitting: 12.6  . Smokeless tobacco: Never Used  Vaping Use  . Vaping Use: Never used  Substance Use Topics  . Alcohol use: No  . Drug use: Never         OPHTHALMIC EXAM:  Base Eye Exam    Visual Acuity (Snellen - Linear)      Right Left   Dist [place]  20/30 -1   Dist cc 20/20    Dist ph [place]  20/20   Correction: Glasses       Tonometry (Tonopen, 3:01 PM)      Right Left   Pressure 16 13       Pupils      Dark Light Shape React APD   Right 4 3 Round Brisk None   Left 4 3 Round Brisk None       Visual Fields (Counting fingers)      Left Right    Full Full       Extraocular Movement      Right Left    Full,  Ortho Full, Ortho       Neuro/Psych    Oriented x3: Yes   Mood/Affect: Normal       Dilation    Both eyes: 1.0% Mydriacyl, 2.5% Phenylephrine @ 3:02 PM        Slit Lamp and Fundus Exam    External Exam      Right Left   External  Periorbital edema       Slit Lamp Exam      Right Left   Lids/Lashes Dermatochalasis - upper lid Dermatochalasis - upper lid   Conjunctiva/Sclera White and quiet White and quiet   Cornea 1-2+ Punctate epithelial erosions Trace Punctate epithelial erosions, mild tear film debris, well healed cataract wound   Anterior Chamber deep, clear, narrow angles deep, clear, narrow temporal angle, 0.5+cell   Iris Round and reactive Round and dilated, mild anterior bowing   Lens 2+ Nuclear sclerosis, 2+ Cortical cataract PC IOL in good position   Vitreous Vitreous syneresis, Posterior vitreous detachment, Weiss ring post vitrectomy, mild pigment       Fundus Exam      Right Left   Disc Pink and Sharp, temporal PPA Pink and Sharp, mild tilt, temporal PPA   C/D Ratio 0.3 0.4   Macula Flat, Blunted foveal reflex, RPE mottling, No heme or edema Flat, clunted foveal reflex, RPE mottling and clumping, No heme or edema   Vessels mild attenuation mild attenuation, mild tortuousity   Periphery Attached, small operculated hole at 0430, pigmented lattice degeneration inferiorly -- early laser changes surrounding retina attached over buckle, Good buckle height, good laser over buckle and around tear, PRE-OP: Bullous inferior detachment from 0300-0800, Good early cryo changes, SRF extending to 0700; ORIGINALLY:  Bullous RD from 0100-230, large HST at 0130; mild p[place]stone degen inferiorly        Refraction    Wearing Rx      Sphere Cylinder Axis   Right -4.00 +0.75 100   Left -4.50 +0.75 100          IMAGING AND PROCEDURES  Imaging and Procedures for 01/17/2021  OCT, Retina - OU - Both Eyes       Right Eye Quality was good. Central Foveal Thickness: 256.  Progression has been stable. Findings include abnormal foveal contour, no IRF, no SRF (irregular foveal contour ).   Left Eye Quality was good. Central Foveal Thickness: 275. Progression has improved. Findings include normal foveal contour, no IRF, no SRF, outer retinal atrophy (Retina attached; Mild improvement in ellipsoid signal nasal macula, trace vitreous opacities).   Notes *Images captured and stored on drive  Diagnosis / Impression:  OD: abnormal foveal countour no IRF/SRF OS: NFP; no IRF/SRF; Retina attached; Mild improvement in ellipsoid signal nasal macula, trace vitreous opacities  Clinical management:  See below  Abbreviations: NFP - Normal foveal profile. CME - cystoid macular edema. PED - pigment epithelial detachment. IRF - intraretinal fluid. SRF - subretinal fluid. EZ - ellipsoid zone. ERM - epiretinal membrane. ORA - outer retinal atrophy. ORT - outer retinal tubulation. SRHM - subretinal hyper-reflective material. IRHM - intraretinal hyper-reflective material                 ASSESSMENT/PLAN:    ICD-10-CM   1. Left retinal detachment  H33.22   2. Retinal edema  H35.81 OCT, Retina - OU - Both Eyes  3. Retinal hole of right eye  H33.321   4. Diabetes mellitus type 2 without retinopathy ([place])  E11.9   5. Combined forms of age-related cataract of right eye  H25.811   6. Pseudophakia  Z96.1     1,2. 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 Retinal hole, OD -  small operculated hole at 0430 - s/p laser retinopexy OD (01.27.22) in OR -- good laser in place  4. Diabetes mellitus, type 2 without retinopathy - The incidence, risk factors for progression, natural history and treatment options for diabetic retinopathy  were discussed with patient.   - The need for close monitoring of blood glucose, blood pressure, and serum lipids, avoiding cigarette or any type of tobacco, and the need for long term follow up was also discussed with patient. - monitor  5. Mixed Cataract OD - The symptoms of cataract, surgical options, and treatments and risks were discussed with patient. - discussed diagnosis and progression - not yet visually significant - monitor for now  6. Pseudophakia OS  - s/p CE/IOL OS ([place], 04.21.22)  - IOL in good position, beautiful surgery, doing well  -  tapering PF -- currently BID OS per [place] - monitor    Ophthalmic Meds Ordered this visit:  No orders of the defined types were placed in this encounter.     Return for 4-6 mos - f/u RD OS, Dilated Exam, OCT.  There are no Patient Instructions on file for this visit.   Explained the diagnoses, plan, and follow up with the patient and they expressed understanding.  Patient expressed understanding of the importance of proper follow up care.   This document serves as a record of services personally performed by Gardiner Sleeper, MD, PhD. It was created on their behalf by Leonie Douglas, an ophthalmic technician. The creation of this record is the provider's dictation and/or activities during the visit.    Electronically signed by: Leonie Douglas COA, 01/17/21  4:26 PM   This document serves as a record of services personally performed by Gardiner Sleeper, MD, PhD. It was created on their behalf by San Jetty. Owens Shark, OA an ophthalmic technician. The creation of this record is the provider's dictation and/or activities during the visit.    Electronically signed by: San Jetty. Owens Shark, [place]  05.12.2022 4:26 PM   Gardiner Sleeper, M.D., Ph.D. Diseases & Surgery of the Retina and [place] 01/17/2021   I have reviewed the above documentation for accuracy and completeness, and I agree with the above. Gardiner Sleeper, M.D., Ph.D. 01/17/21 4:26 PM   Abbreviations: M myopia (nearsighted); A astigmatism; H hyperopia (farsighted); P presbyopia; Mrx spectacle prescription;  CTL contact lenses; OD right eye; OS left eye; OU both eyes  XT exotropia; ET esotropia; PEK punctate epithelial keratitis; PEE punctate epithelial erosions; DES dry eye syndrome; MGD meibomian gland dysfunction; ATs artificial tears; PFAT's preservative free artificial tears; [place] nuclear sclerotic cataract; PSC posterior subcapsular cataract; ERM epi-retinal membrane; PVD posterior vitreous detachment; RD retinal detachment; DM diabetes mellitus; DR diabetic retinopathy; NPDR non-proliferative diabetic retinopathy; PDR proliferative diabetic retinopathy; CSME clinically significant macular edema; DME diabetic macular edema; dbh dot blot hemorrhages; CWS cotton wool spot; POAG primary open angle glaucoma; C/D cup-to-disc ratio; HVF humphrey visual field; GVF goldmann visual field; OCT optical coherence tomography; IOP intraocular pressure; BRVO Branch retinal vein occlusion; CRVO central retinal vein occlusion; CRAO central retinal artery occlusion; BRAO branch retinal artery occlusion; RT retinal tear; SB scleral buckle; PPV pars plana vitrectomy; VH Vitreous hemorrhage; PRP panretinal laser photocoagulation; IVK intravitreal kenalog; VMT vitreomacular traction; MH Macular hole;  NVD neovascularization of the disc; NVE neovascularization elsewhere; AREDS age related eye disease study; ARMD age related macular degeneration; POAG primary open angle glaucoma; EBMD epithelial/anterior basement membrane dystrophy; ACIOL anterior chamber intraocular lens; IOL intraocular lens; PCIOL posterior chamber  intraocular lens; Phaco/IOL phacoemulsification with intraocular lens placement; [place] photorefractive keratectomy; LASIK laser assisted in situ keratomileusis; HTN hypertension; DM diabetes mellitus; COPD chronic obstructive pulmonary disease

## 2021-01-14 NOTE — Progress Notes (Signed)
Patient Care Team: Maurice Small, MD as PCP - General (Family Medicine) Megan Salon, MD as Consulting Physician (Gynecology) Sylvan Cheese, NP as Nurse Practitioner (Hematology and Oncology)  DIAGNOSIS:    ICD-10-CM   1. 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 p(m)T1b, pN0: Stage IA; RIGHT mastectomy: PASH    01/09/2016 Oncotype testing   Oncotype score: 5; 5% ROR   02/2016 -  Anti-estrogen oral therapy   Tamoxifen 20 mg daily.      CHIEF COMPLIANT: Follow-up of breast cancer on tamoxifen   INTERVAL HISTORY: Christina [name] is a 55 y.o. with above-mentioned history of breast cancer treated with bilateral mastectomies followed by tamoxifen therapy. Bilateral breast US on 05/10/20 showed no evidence of maligancy. She presents to the clinic today for annual follow-up.   She is tolerating tamoxifen fairly well without any major problems.  No longer has hot flashes.  Denies any lumps or nodules in the  breast.  ALLERGIES:  has No Known Allergies.  MEDICATIONS:  Current Outpatient Medications  Medication Sig Dispense Refill  . tamoxifen (NOLVADEX) 20 MG tablet Take 1 tablet (20 mg total) by mouth daily. 90 tablet 3  . ALPRAZolam (XANAX) 0.25 MG tablet Take 0.25 mg by mouth daily as needed for anxiety.    Marland Kitchen amitriptyline (ELAVIL) 100 MG tablet Take 0.5 tablets (50 mg total) by mouth at bedtime.    . bacitracin-polymyxin b (POLYSPORIN) ophthalmic ointment Place into the left eye at bedtime. Place a 1/2 inch ribbon of ointment into the lower eyelid at bedtime and as needed 3.5 g 3  . FLUoxetine (PROZAC) 40 MG capsule Take 40 mg by mouth daily.    Marland Kitchen HYDROcodone-acetaminophen (NORCO/VICODIN) 5-325 MG tablet Take 1 tablet by mouth every 4 (four) hours as needed for moderate pain. 20 tablet 0  . metFORMIN (GLUCOPHAGE) 500 MG tablet Take 1 tablet (500 mg total) by mouth 2 (two) times daily with a meal. 60 tablet 0  . neomycin-polymyxin b-dexamethasone (MAXITROL) 3.5-10000-0.1 OINT Place 1 application into the left eye 4 (four) times daily. 3.5 g 3  . neomycin-polymyxin-hydrocortisone (CORTISPORIN) 3.5-10000-1 OTIC suspension Place into the right ear.    . ondansetron (ZOFRAN ODT) 4 MG disintegrating tablet Take 1 tablet (4 mg total) by mouth every 8 (eight) hours as needed for nausea or vomiting. 20 tablet 0  . prednisoLONE acetate (PRED FORTE) 1 % ophthalmic suspension Place 1 drop into the left eye 4 (four) times daily. 15 mL 0  . Semaglutide,0.25 or 0.5MG/DOS, (OZEMPIC, 0.25 OR 0.5 MG/DOSE,)  2 MG/1.5ML SOPN Inject 0.5 mg into the skin once a week. 1.5 mL 0  . topiramate (TOPAMAX) 50 MG tablet 1 po twice daily 60 tablet 0  . vitamin B-12 (CYANOCOBALAMIN) 500 MCG tablet Take 2 tablets (1,000 mcg total) by mouth daily.    . Vitamin D, Ergocalciferol, (DRISDOL) 1.25 MG (50000 UNIT) CAPS capsule 1 po q wed and 1 po q sun 8 capsule 0   No current facility-administered medications for this visit.     PHYSICAL EXAMINATION: ECOG PERFORMANCE STATUS: 1 - Symptomatic but completely ambulatory  Vitals:   01/15/21 0839  BP: 127/68  Pulse: 66  Resp: 18  Temp: (!) 97.4 F (36.3 C)  SpO2: 100%   Filed Weights   01/15/21 0839  Weight: 208 lb 12.8 oz (94.7 kg)    BREAST: No palpable masses or nodules in either right or left breasts. No palpable axillary supraclavicular or infraclavicular adenopathy no breast tenderness or nipple discharge. (exam performed in the presence of a chaperone)  LABORATORY DATA:  I have reviewed the data as listed CMP Latest Ref Rng & Units 01/07/2021 05/15/2020 12/20/2019  Glucose 70 - 99 mg/dL 130(H) 96 110(H)  BUN 6 - 20 mg/dL 26(H) 10 17  Creatinine 0.44 - 1.00 mg/dL 1.05(H) 0.71 0.50  Sodium 135 - 145 mmol/L 134(L) 138 139  Potassium 3.5 - 5.1 mmol/L 2.8(L) 4.7 4.1  Chloride 98 - 111 mmol/L 112(H) 105 107  CO2 22 - 32 mmol/L 14(L) 23 -  Calcium 8.9 - 10.3 mg/dL 10.1 9.6 -  Total Protein 6.5 - 8.1 g/dL 7.7 6.8 -  Total Bilirubin 0.3 - 1.2 mg/dL 0.7 0.3 -  Alkaline Phos 38 - 126 U/L 54 52 -  AST 15 - 41 U/L 14(L) 23 -  ALT 0 - 44 U/L 22 34(H) -    Lab Results  Component Value Date   WBC 12.6 (H) 01/07/2021   HGB 15.5 (H) 01/07/2021   HCT 46.6 (H) 01/07/2021   MCV 85.3 01/07/2021   PLT 345 01/07/2021   NEUTROABS 10.4 (H) 01/07/2021    ASSESSMENT & PLAN:  Malignant neoplasm of central portion of left breast in female, estrogen receptor positive ([place]) Left mastectomy 01/09/2016: Multifocal IDC 0.6cm (grade 2), 0.4, 0.3 cm (grade 1), IG-DCIS and sep focus of HG DCIS; right mastectomy:PASH, 0/4 Lt Ax[place] LN Neg ER/PR HER-2 are being retested Previously 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 Tamoxifen toxicities:Hot flashes have gone away. 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  Surveillance: No role of mammograms since she had bil mastectomies (bilateral reconstructed breasts without any palpable lumps or nodules) Chest exam: 01/15/21: No palpable lumps in chest wall or axilla.  Return to clinic in1 yearfor follow-up andbreast exams and surveillance    No orders of the defined types were placed in this encounter.  The patient has a good understanding of the overall plan. she agrees with it. she will call with any problems that may develop before the next visit here.  Total time spent: 20 mins including face to face time and time spent for planning, charting and coordination of care  Rulon Eisenmenger, MD, MPH 01/15/2021  I, Molly Dorshimer, am acting as scribe for Dr. Nicholas Lose.  I  have reviewed the above documentation for accuracy and completeness, and I agree with the above.

## 2021-01-15 ENCOUNTER — Other Ambulatory Visit: Payer: Self-pay

## 2021-01-15 ENCOUNTER — Inpatient Hospital Stay (HOSPITAL_BASED_OUTPATIENT_CLINIC_OR_DEPARTMENT_OTHER): Payer: 59 | Admitting: Hematology and Oncology

## 2021-01-15 DIAGNOSIS — Z7952 Long term (current) use of systemic steroids: Secondary | ICD-10-CM | POA: Diagnosis not present

## 2021-01-15 DIAGNOSIS — Z170 Estrogen receptor positive status [ER+]: Secondary | ICD-10-CM

## 2021-01-15 DIAGNOSIS — C50112 Malignant neoplasm of central portion of left female breast: Secondary | ICD-10-CM | POA: Diagnosis present

## 2021-01-15 DIAGNOSIS — Z9013 Acquired absence of bilateral breasts and nipples: Secondary | ICD-10-CM | POA: Diagnosis not present

## 2021-01-15 DIAGNOSIS — Z79899 Other long term (current) drug therapy: Secondary | ICD-10-CM | POA: Diagnosis not present

## 2021-01-15 DIAGNOSIS — Z79810 Long term (current) use of selective estrogen receptor modulators (SERMs): Secondary | ICD-10-CM | POA: Diagnosis not present

## 2021-01-15 DIAGNOSIS — Z7984 Long term (current) use of oral hypoglycemic drugs: Secondary | ICD-10-CM | POA: Diagnosis not present

## 2021-01-15 MED ORDER — TAMOXIFEN CITRATE 20 MG PO TABS: 20.0000 mg | ORAL_TABLET | Freq: Every day | ORAL | 3 refills | Status: DC

## 2021-01-15 NOTE — Assessment & Plan Note (Signed)
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  Surveillance: No role of mammograms since she had bil mastectomies Chest exam: 01/15/21: No palpable lumps in chest wall or axilla.  Return to clinic in1 yearfor follow-up andbreast exams and surveillance

## 2021-01-17 ENCOUNTER — Encounter (INDEPENDENT_AMBULATORY_CARE_PROVIDER_SITE_OTHER): Payer: Self-pay | Admitting: Ophthalmology

## 2021-01-17 ENCOUNTER — Other Ambulatory Visit: Payer: Self-pay

## 2021-01-17 ENCOUNTER — Ambulatory Visit (INDEPENDENT_AMBULATORY_CARE_PROVIDER_SITE_OTHER): Payer: 59 | Admitting: Ophthalmology

## 2021-01-17 DIAGNOSIS — Z961 Presence of intraocular lens: Secondary | ICD-10-CM

## 2021-01-17 DIAGNOSIS — E119 Type 2 diabetes mellitus without complications: Secondary | ICD-10-CM | POA: Diagnosis not present

## 2021-01-17 DIAGNOSIS — H33321 Round hole, right eye: Secondary | ICD-10-CM | POA: Diagnosis not present

## 2021-01-17 DIAGNOSIS — H3581 Retinal edema: Secondary | ICD-10-CM | POA: Diagnosis not present

## 2021-01-17 DIAGNOSIS — H25811 Combined forms of age-related cataract, right eye: Secondary | ICD-10-CM

## 2021-01-17 DIAGNOSIS — H3322 Serous retinal detachment, left eye: Secondary | ICD-10-CM

## 2021-01-17 DIAGNOSIS — H25813 Combined forms of age-related cataract, bilateral: Secondary | ICD-10-CM

## 2021-01-24 ENCOUNTER — Ambulatory Visit (INDEPENDENT_AMBULATORY_CARE_PROVIDER_SITE_OTHER): Payer: 59 | Admitting: Family Medicine

## 2021-01-24 ENCOUNTER — Other Ambulatory Visit: Payer: Self-pay

## 2021-01-24 ENCOUNTER — Encounter (INDEPENDENT_AMBULATORY_CARE_PROVIDER_SITE_OTHER): Payer: Self-pay | Admitting: Family Medicine

## 2021-01-24 VITALS — BP 112/74 | HR 68 | Temp 98.0°F | Ht 68.0 in | Wt 199.0 lb

## 2021-01-24 DIAGNOSIS — E559 Vitamin D deficiency, unspecified: Secondary | ICD-10-CM

## 2021-01-24 DIAGNOSIS — Z9189 Other specified personal risk factors, not elsewhere classified: Secondary | ICD-10-CM | POA: Diagnosis not present

## 2021-01-24 DIAGNOSIS — E669 Obesity, unspecified: Secondary | ICD-10-CM | POA: Diagnosis not present

## 2021-01-24 DIAGNOSIS — R7303 Prediabetes: Secondary | ICD-10-CM | POA: Diagnosis not present

## 2021-01-24 DIAGNOSIS — F39 Unspecified mood [affective] disorder: Secondary | ICD-10-CM

## 2021-01-24 DIAGNOSIS — Z6833 Body mass index (BMI) 33.0-33.9, adult: Secondary | ICD-10-CM

## 2021-01-24 MED ORDER — TOPIRAMATE 50 MG PO TABS: ORAL_TABLET | ORAL | 0 refills | Status: DC

## 2021-01-24 MED ORDER — VITAMIN D (ERGOCALCIFEROL) 1.25 MG (50000 UNIT) PO CAPS: ORAL_CAPSULE | ORAL | 0 refills | Status: DC

## 2021-01-24 MED ORDER — OZEMPIC (0.25 OR 0.5 MG/DOSE) 2 MG/1.5ML ~~LOC~~ SOPN
0.5000 mg | PEN_INJECTOR | SUBCUTANEOUS | 0 refills | Status: DC
Start: 2021-01-24 — End: 2021-03-20

## 2021-01-28 DIAGNOSIS — Z9189 Other specified personal risk factors, not elsewhere classified: Secondary | ICD-10-CM | POA: Insufficient documentation

## 2021-01-30 NOTE — Progress Notes (Signed)
Chief Complaint:   OBESITY Christina [name] is here to discuss her progress with her obesity treatment plan along with follow-up of her obesity related diagnoses.   Today's visit was #: 66 Starting weight: 218 lbs Starting date: 05/15/2020 Today's weight: 199 lbs Today's date: 01/24/2021 Weight change since last visit: 8 lbs Total lbs lost to date: 19 lbs Body mass index is 30.26 kg/m.  Total weight loss percentage to date: -8.72%  Interim History:  Christina [name] is here for a follow up office visit and she is following the meal plan without concerns or issues.  Patient's meal and food recall appears to be accurate and consistent with what is on the plan.  When on plan, her hunger and cravings are well controlled.    Christina [name] says she is doing more food prep than before.  She is happy with her progress.  Current Meal Plan: the Category 3 Plan for 80% of the time.  Current Exercise Plan: Walking for 20 minutes 4 times per week. Current Anti-Obesity Medications: Ozempic 0.5 mg subcutaneously weekly. Side effects: None.  Assessment/Plan:   Medications Discontinued During This Encounter  Medication Reason  . metFORMIN (GLUCOPHAGE) 500 MG tablet Change in therapy  . prednisoLONE acetate (PRED FORTE) 1 % ophthalmic suspension   . neomycin-polymyxin-hydrocortisone (CORTISPORIN) 3.5-10000-1 OTIC suspension   . bacitracin-polymyxin b (POLYSPORIN) ophthalmic ointment   . neomycin-polymyxin b-dexamethasone (MAXITROL) 3.5-10000-0.1 OINT   . HYDROcodone-acetaminophen (NORCO/VICODIN) 5-325 MG tablet   . topiramate (TOPAMAX) 50 MG tablet Reorder  . Vitamin D, Ergocalciferol, (DRISDOL) 1.25 MG (50000 UNIT) CAPS capsule Reorder  . Semaglutide,0.25 or 0.5MG /DOS, (OZEMPIC, 0.25 OR 0.5 MG/DOSE,) 2 MG/1.5ML SOPN Reorder    Meds ordered this encounter  Medications  . Semaglutide,0.25 or 0.5MG /DOS, (OZEMPIC, 0.25 OR 0.5 MG/DOSE,) 2 MG/1.5ML SOPN    Sig: Inject 0.5 mg into the skin once a week.     Dispense:  1.5 mL    Refill:  0  . topiramate (TOPAMAX) 50 MG tablet    Sig: 1 po twice daily    Dispense:  60 tablet    Refill:  0  . Vitamin D, Ergocalciferol, (DRISDOL) 1.25 MG (50000 UNIT) CAPS capsule    Sig: 1 po q wed and 1 po q sun    Dispense:  8 capsule    Refill:  0    1. Prediabetes Not at goal. Goal is HgbA1c < 5.7.  Medication: metformin 500 mg twice daily, Ozempic 0.5 mg subcutaneously weekly.  Started Ozempic at last office visit.  Working great.  She went out for hot dogs but could only eat one.  Has less desire for foods and less appetite.  Plan:  Continue metformin at current dose.  Will refill Ozempic today, as per below.  Counseling done.  She will continue to focus on protein-rich, low simple carbohydrate foods. We reviewed the importance of hydration, regular exercise for stress reduction, and restorative sleep.   Lab Results  Component Value Date   HGBA1C 6.0 (H) 11/13/2020   Lab Results  Component Value Date   INSULIN 18.4 11/13/2020   INSULIN 34.7 (H) 05/15/2020   - Refill Semaglutide,0.25 or 0.5MG /DOS, (OZEMPIC, 0.25 OR 0.5 MG/DOSE,) 2 MG/1.5ML SOPN; Inject 0.5 mg into the skin once a week.  Dispense: 1.5 mL; Refill: 0  2. Vitamin D deficiency Not at goal. Current vitamin D is 31.3, tested on 11/13/2020. Optimal goal > 50 ng/dL.  She is taking vitamin D 50,000 IU twice weekly.  Plan:  Continue to take prescription Vitamin D @50 ,000 IU twice weekly as prescribed.  Follow-up for routine testing of Vitamin D, at least 2-3 times per year to avoid over-replacement.  - Refill Vitamin D, Ergocalciferol, (DRISDOL) 1.25 MG (50000 UNIT) CAPS capsule; 1 po q wed and 1 po q sun  Dispense: 8 capsule; Refill: 0  3. Mood disorder, with emotional eating Stable.  Medication: Topamax 50 mg twice daily.  Plan:  Will refill Topamax 50 mg twice daily today.  Behavior modification techniques were discussed today to help deal with emotional/non-hunger eating behaviors.  -  Refill topiramate (TOPAMAX) 50 MG tablet; 1 po twice daily  Dispense: 60 tablet; Refill: 0  4. At risk for dehydration Christina [name] is at higher than average risk of dehydration.  Christina [name] was given more than 10 minutes of proper hydration counseling today.  We discussed the signs and symptoms of dehydration, some of which may include muscle cramping, constipation or even orthostatic symptoms.  Counseling on the prevention of dehydration was also provided today.  Christina [name] is at risk for dehydration due to weight loss, lifestyle and behavorial habits and possibly due to taking certain medication(s).  She was encouraged to adequately hydrate and monitor fluid status to avoid dehydration as well as weight loss plateaus.  Unless pre-existing renal or cardiopulmonary conditions exist, in which patient was told to limit their fluid intake, I recommended roughly one half of their weight in pounds to be the approximate ounces of non-caloric, non-caffeinated beverages they should drink per day; including more if they are engaging in exercise.  5. Class 1 obesity with serious comorbidity and body mass index (BMI) of 33.0 to 33.9 in adult, unspecified obesity type  Course: Christina [name] is currently in the action stage of change. As such, her goal is to continue with weight loss efforts.   Nutrition goals: She has agreed to the Category 3 Plan.   Exercise goals: As is.  Behavioral modification strategies: decreasing simple carbohydrates, increasing water intake, no skipping meals and planning for success.  Christina [name] has agreed to follow-up with our clinic in 3 weeks. She was informed of the importance of frequent follow-up visits to maximize her success with intensive lifestyle modifications for her multiple health conditions.   Objective:   Blood pressure 112/74, pulse 68, temperature 98 F (36.7 C), height 5\' 8"  (1.727 m), weight 199 lb (90.3 kg), SpO2 99 %. Body mass index is 30.26 kg/m.  General: Cooperative, alert,  well developed, in no acute distress. HEENT: Conjunctivae and lids unremarkable. Cardiovascular: Regular rhythm.  Lungs: Normal work of breathing. Neurologic: No focal deficits.   Lab Results  Component Value Date   CREATININE 1.05 (H) 01/07/2021   BUN 26 (H) 01/07/2021   NA 134 (L) 01/07/2021   K 2.8 (L) 01/07/2021   CL 112 (H) 01/07/2021   CO2 14 (L) 01/07/2021   Lab Results  Component Value Date   ALT 22 01/07/2021   AST 14 (L) 01/07/2021   ALKPHOS 54 01/07/2021   BILITOT 0.7 01/07/2021   Lab Results  Component Value Date   HGBA1C 6.0 (H) 11/13/2020   HGBA1C 6.3 (H) 05/15/2020   Lab Results  Component Value Date   INSULIN 18.4 11/13/2020   INSULIN 34.7 (H) 05/15/2020   Lab Results  Component Value Date   TSH 1.730 05/15/2020   Lab Results  Component Value Date   CHOL 125 05/15/2020   HDL 36 (L) 05/15/2020   LDLCALC 71 05/15/2020   TRIG 95 05/15/2020  Lab Results  Component Value Date   WBC 12.6 (H) 01/07/2021   HGB 15.5 (H) 01/07/2021   HCT 46.6 (H) 01/07/2021   MCV 85.3 01/07/2021   PLT 345 01/07/2021   Attestation Statements:   Reviewed by clinician on day of visit: allergies, medications, problem list, medical history, surgical history, family history, social history, and previous encounter notes.  I, Water quality scientist, CMA, am acting as Location manager for [employer], DO.  I have reviewed the above documentation for accuracy and completeness, and I agree with the above. Marjory Sneddon, D.O.  The [place] was signed into law in 2016 which includes the topic of electronic health records.  This provides immediate access to information in MyChart.  This includes consultation notes, operative notes, office notes, lab results and pathology reports.  If you have any questions about what you read please let us know at your next visit so we can discuss your concerns and take corrective action if need be.  We are right here with you.

## 2021-02-18 ENCOUNTER — Ambulatory Visit (INDEPENDENT_AMBULATORY_CARE_PROVIDER_SITE_OTHER): Payer: 59 | Admitting: Family Medicine

## 2021-02-23 ENCOUNTER — Other Ambulatory Visit (INDEPENDENT_AMBULATORY_CARE_PROVIDER_SITE_OTHER): Payer: Self-pay | Admitting: Family Medicine

## 2021-02-23 DIAGNOSIS — R7303 Prediabetes: Secondary | ICD-10-CM

## 2021-02-25 NOTE — Telephone Encounter (Signed)
Dr.Opalski ?

## 2021-03-06 NOTE — Telephone Encounter (Signed)
Pt has an appt now. Can I refill?

## 2021-03-07 ENCOUNTER — Other Ambulatory Visit (INDEPENDENT_AMBULATORY_CARE_PROVIDER_SITE_OTHER): Payer: Self-pay | Admitting: Family Medicine

## 2021-03-07 DIAGNOSIS — R7303 Prediabetes: Secondary | ICD-10-CM

## 2021-03-07 NOTE — Telephone Encounter (Signed)
Pt is calling in checking the status of her med refill. The pt said that she is is running out and she knows we are closed for the next 4 days. Please advise

## 2021-03-07 NOTE — Telephone Encounter (Signed)
In person visit on 5/19, last refilled at that time and was asked to follow up in 3 weeks. Next F/U is 7/13 and patient added a note as to why to the left.

## 2021-03-12 NOTE — Telephone Encounter (Signed)
Pt last seen by Dr. Opalski.  

## 2021-03-20 ENCOUNTER — Ambulatory Visit (INDEPENDENT_AMBULATORY_CARE_PROVIDER_SITE_OTHER): Payer: 59 | Admitting: Family Medicine

## 2021-03-20 ENCOUNTER — Encounter (INDEPENDENT_AMBULATORY_CARE_PROVIDER_SITE_OTHER): Payer: Self-pay | Admitting: Family Medicine

## 2021-03-20 ENCOUNTER — Other Ambulatory Visit: Payer: Self-pay

## 2021-03-20 VITALS — BP 114/76 | HR 67 | Temp 97.8°F | Ht 68.0 in | Wt 197.0 lb

## 2021-03-20 DIAGNOSIS — R7303 Prediabetes: Secondary | ICD-10-CM

## 2021-03-20 DIAGNOSIS — E559 Vitamin D deficiency, unspecified: Secondary | ICD-10-CM | POA: Diagnosis not present

## 2021-03-20 DIAGNOSIS — Z9189 Other specified personal risk factors, not elsewhere classified: Secondary | ICD-10-CM

## 2021-03-20 DIAGNOSIS — E669 Obesity, unspecified: Secondary | ICD-10-CM | POA: Diagnosis not present

## 2021-03-20 DIAGNOSIS — F39 Unspecified mood [affective] disorder: Secondary | ICD-10-CM

## 2021-03-20 DIAGNOSIS — Z6833 Body mass index (BMI) 33.0-33.9, adult: Secondary | ICD-10-CM

## 2021-03-20 DIAGNOSIS — E66811 Obesity, class 1: Secondary | ICD-10-CM

## 2021-03-20 DIAGNOSIS — E538 Deficiency of other specified B group vitamins: Secondary | ICD-10-CM

## 2021-03-20 MED ORDER — OZEMPIC (0.25 OR 0.5 MG/DOSE) 2 MG/1.5ML ~~LOC~~ SOPN: 0.5000 mg | PEN_INJECTOR | SUBCUTANEOUS | 0 refills | Status: DC

## 2021-03-20 MED ORDER — TOPIRAMATE 50 MG PO TABS: ORAL_TABLET | ORAL | 0 refills | Status: DC

## 2021-03-20 MED ORDER — VITAMIN D (ERGOCALCIFEROL) 1.25 MG (50000 UNIT) PO CAPS: ORAL_CAPSULE | ORAL | 0 refills | Status: DC

## 2021-04-02 NOTE — Progress Notes (Signed)
Chief Complaint:   OBESITY Christina [name] is here to discuss her progress with her obesity treatment plan along with follow-up of her obesity related diagnoses.   Today's visit was #: 14 Starting weight: 218 lbs Starting date: 05/15/2020 Today's weight: 197 lbs Today's date: 03/20/2021 Weight change since last visit: 2 lbs Total lbs lost to date: 21 lbs Body mass index is 29.95 kg/m.  Total weight loss percentage to date: -9.63%  Interim History:  Christina [name] is here for a follow up office visit.  We reviewed her meal plan and questions were answered.  Patient's food recall appears to be accurate and consistent with what is on plan when she is following it.   When eating on plan, her hunger and cravings are well controlled.    Current Meal Plan: the Category 3 Plan for 30% of the time.  Current Exercise Plan: Walking for 10-15 minutes 3 times per week. Current Anti-Obesity Medications: Ozempic 0.5 mg subcutaneously weekly. Side effects: None.  Assessment/Plan:   Medications Discontinued During This Encounter  Medication Reason   Semaglutide,0.25 or 0.'5MG'$ /DOS, (OZEMPIC, 0.25 OR 0.5 MG/DOSE,) 2 MG/1.5ML SOPN Reorder   topiramate (TOPAMAX) 50 MG tablet Reorder   Vitamin D, Ergocalciferol, (DRISDOL) 1.25 MG (50000 UNIT) CAPS capsule Reorder   Meds ordered this encounter  Medications   Semaglutide,0.25 or 0.'5MG'$ /DOS, (OZEMPIC, 0.25 OR 0.5 MG/DOSE,) 2 MG/1.5ML SOPN    Sig: Inject 0.5 mg into the skin once a week.    Dispense:  1.5 mL    Refill:  0   Vitamin D, Ergocalciferol, (DRISDOL) 1.25 MG (50000 UNIT) CAPS capsule    Sig: 1 po q wed and 1 po q sun    Dispense:  8 capsule    Refill:  0   topiramate (TOPAMAX) 50 MG tablet    Sig: 1 po twice daily    Dispense:  60 tablet    Refill:  0    1. Prediabetes Not at goal. Goal is HgbA1c < 5.7.  Medication: Ozempic 0.5 mg subcutaneously weekly.    Plan:  She will continue to focus on protein-rich, low simple carbohydrate foods. We  reviewed the importance of hydration, regular exercise for stress reduction, and restorative sleep.   Lab Results  Component Value Date   HGBA1C 6.0 (H) 11/13/2020   Lab Results  Component Value Date   INSULIN 18.4 11/13/2020   INSULIN 34.7 (H) 05/15/2020   - Refill Semaglutide,0.25 or 0.'5MG'$ /DOS, (OZEMPIC, 0.25 OR 0.5 MG/DOSE,) 2 MG/1.5ML SOPN; Inject 0.5 mg into the skin once a week.  Dispense: 1.5 mL; Refill: 0  2. Vitamin D deficiency Not at goal.  She is taking vitamin D 50,000 IU twice weekly.  Tolerating well, without side effects.   Plan: Continue to take prescription Vitamin D '@50'$ ,000 IU twice weekly as prescribed.  Follow-up for routine testing of Vitamin D, at least 2-3 times per year to avoid over-replacement.  Lab Results  Component Value Date   VD25OH 31.3 11/13/2020   VD25OH 21.2 (L) 05/15/2020   - Refill Vitamin D, Ergocalciferol, (DRISDOL) 1.25 MG (50000 UNIT) CAPS capsule; 1 po q wed and 1 po q sun  Dispense: 8 capsule; Refill: 0  3. Mood disorder, with emotional eating Controlled. Medication: Elavil 50 mg daily, topiramate 50 mg twice daily.  Plan:  Refill topiramate at same dose.  Behavior modification techniques were discussed today to help deal with emotional/non-hunger eating behaviors.  - Refill topiramate (TOPAMAX) 50 MG tablet; 1 po  twice daily  Dispense: 60 tablet; Refill: 0  4. At risk for malnutrition Christina [name] was given extensive malnutrition prevention education and counseling today of more than 8 minutes.  Counseled her that malnutrition refers to inappropriate nutrients or not the right balance of nutrients for optimal health.  Discussed with Christina [name] that it is absolutely possible to be malnourished but yet obese.  Risk factors, including but not limited to, inappropriate dietary choices, difficulty with obtaining food due to physical or financial limitations, and various physical and mental health conditions were reviewed with Christina [name].    5. Obesity BMI today is 30  Course: Christina [name] is currently in the action stage of change. As such, her goal is to continue with weight loss efforts.   Nutrition goals: She has agreed to the Category 3 Plan.   Exercise goals:  As is.  Behavioral modification strategies: meal planning and cooking strategies and planning for success.  Christina [name] has agreed to follow-up with our clinic in 3 weeks. She was informed of the importance of frequent follow-up visits to maximize her success with intensive lifestyle modifications for her multiple health conditions.   Objective:   Blood pressure 114/76, pulse 67, temperature 97.8 F (36.6 C), height '5\' 8"'$  (1.727 m), weight 197 lb (89.4 kg), SpO2 98 %. Body mass index is 29.95 kg/m.  General: Cooperative, alert, well developed, in no acute distress. HEENT: Conjunctivae and lids unremarkable. Cardiovascular: Regular rhythm.  Lungs: Normal work of breathing. Neurologic: No focal deficits.   Lab Results  Component Value Date   CREATININE 1.05 (H) 01/07/2021   BUN 26 (H) 01/07/2021   NA 134 (L) 01/07/2021   K 2.8 (L) 01/07/2021   CL 112 (H) 01/07/2021   CO2 14 (L) 01/07/2021   Lab Results  Component Value Date   ALT 22 01/07/2021   AST 14 (L) 01/07/2021   ALKPHOS 54 01/07/2021   BILITOT 0.7 01/07/2021   Lab Results  Component Value Date   HGBA1C 6.0 (H) 11/13/2020   HGBA1C 6.3 (H) 05/15/2020   Lab Results  Component Value Date   INSULIN 18.4 11/13/2020   INSULIN 34.7 (H) 05/15/2020   Lab Results  Component Value Date   TSH 1.730 05/15/2020   Lab Results  Component Value Date   CHOL 125 05/15/2020   HDL 36 (L) 05/15/2020   LDLCALC 71 05/15/2020   TRIG 95 05/15/2020   Lab Results  Component Value Date   VD25OH 31.3 11/13/2020   VD25OH 21.2 (L) 05/15/2020   Lab Results  Component Value Date   WBC 12.6 (H) 01/07/2021   HGB 15.5 (H) 01/07/2021   HCT 46.6 (H) 01/07/2021   MCV 85.3 01/07/2021   PLT 345 01/07/2021    Attestation Statements:   Reviewed by clinician on day of visit: allergies, medications, problem list, medical history, surgical history, family history, social history, and previous encounter notes.  I, Water quality scientist, CMA, am acting as Location manager for [employer], DO.  I have reviewed the above documentation for accuracy and completeness, and I agree with the above. Marjory Sneddon, D.O.  The [place] was signed into law in 2016 which includes the topic of electronic health records.  This provides immediate access to information in MyChart.  This includes consultation notes, operative notes, office notes, lab results and pathology reports.  If you have any questions about what you read please let us know at your next visit so we can discuss your  concerns and take corrective action if need be.  We are right here with you.

## 2021-04-05 ENCOUNTER — Telehealth: Payer: Self-pay

## 2021-04-05 NOTE — Telephone Encounter (Signed)
Patient called regarding "knot" in left axillary area.   History of left breast cancer, ER +.  Currently on Tamoxifen.   Pea size knot in left axillary, patient found today.  Denies any redness, or drainage to area.  No recent increase in activity.  No fever.   RN recommend patient to monitor for changes in size.  Utilize warm compress, Ibuprofen, and arm elevation.  Pt will call back to clinic early next week to report changes, or effectiveness.  If no improvement, per MD recommendations will obtain ultrasound.  Pt verbalized understanding, no further needs.

## 2021-04-08 ENCOUNTER — Other Ambulatory Visit: Payer: Self-pay

## 2021-04-08 ENCOUNTER — Emergency Department (HOSPITAL_COMMUNITY)
Admission: EM | Admit: 2021-04-08 | Discharge: 2021-04-08 | Disposition: A | Discharge status: Home / Self Care | Payer: 59 | Attending: Emergency Medicine | Admitting: Emergency Medicine

## 2021-04-08 ENCOUNTER — Telehealth: Payer: Self-pay | Admitting: *Deleted

## 2021-04-08 ENCOUNTER — Encounter (HOSPITAL_COMMUNITY): Payer: Self-pay

## 2021-04-08 DIAGNOSIS — Z853 Personal history of malignant neoplasm of breast: Secondary | ICD-10-CM | POA: Diagnosis not present

## 2021-04-08 DIAGNOSIS — S6992XA Unspecified injury of left wrist, hand and finger(s), initial encounter: Secondary | ICD-10-CM

## 2021-04-08 DIAGNOSIS — S60352A Superficial foreign body of left thumb, initial encounter: Secondary | ICD-10-CM | POA: Diagnosis present

## 2021-04-08 DIAGNOSIS — Z87891 Personal history of nicotine dependence: Secondary | ICD-10-CM | POA: Diagnosis not present

## 2021-04-08 DIAGNOSIS — W458XXA Other foreign body or object entering through skin, initial encounter: Secondary | ICD-10-CM | POA: Insufficient documentation

## 2021-04-08 MED ORDER — LIDOCAINE HCL (PF) 1 % IJ SOLN
10.0000 mL | Freq: Once | INTRAMUSCULAR | Status: DC
  Filled 2021-04-08: qty 30

## 2021-04-08 NOTE — ED Provider Notes (Signed)
[facility] DEPT Provider Note   CSN: [id number] Arrival date & time: 04/08/21  0843     History No chief complaint on file.   Christina [name] is a 55 y.o. female.  HPI For evaluation of fishhook in left thumb, accidentally this morning when she touched against it.  Tetanus up-to-date.  No other complaints.    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,  multifocal IDC, DCIS, ER/PR+,  01-09-2016 s/p bilteral mastectomies w/ left sln bx;  no chemoradiation   Cataracts, bilateral    Depression    GAD (generalized anxiety disorder)    Gallbladder problem    History of ovarian cyst    IBS (irritable bowel syndrome)    Joint pain    PONV (postoperative nausea and vomiting)    does well with scop patch   Pre-diabetes    followed by pcp; on metformin for weight loss -Dr Mellody Dance   Retinal detachment    Rheg OS   Right knee meniscal tear    Urgency of urination     Patient Active Problem List   Diagnosis Date Noted   At risk for dehydration 01/28/2021   At risk for malnutrition 01/01/2021   Obesity, Class I, BMI 30-34.9 12/31/2020   Vitamin B12 deficiency 10/08/2020   At risk for depression 10/08/2020   Mood disorder ([place]), with emotional eating 09/24/2020   At risk for impaired metabolic function 09/73/5329   At risk for diabetes mellitus 07/30/2020   Vitamin D deficiency 07/02/2020   Depression 07/02/2020   At risk for side effect of medication 07/02/2020   Stress due to illness of family member-  daughter with drug addiction 05/15/2020   Depression, recurrent ([place]) 05/15/2020   Anemia 05/15/2020   Constipation 05/15/2020   Prediabetes 05/15/2020   Breast asymmetry following reconstructive surgery 04/24/2020   Acquired absence of breast 04/24/2020   S/P breast reconstruction, bilateral 04/24/2020    Acute medial meniscus tear of left knee 12/20/2019   Breast cancer, left ([place]) 01/09/2016   Genetic testing 92/42/6834   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   Family history of breast cancer in female 11/08/2015   Family history of colon cancer 11/08/2015   Malignant neoplasm of central portion of left breast in female, estrogen receptor positive ([place]) 10/31/2015   Endometriosis 03/11/2011    Past Surgical History:  Procedure Laterality Date   ABDOMINAL HYSTERECTOMY  05/1996   endometriosis   ACHILLES TENDON REPAIR Right 2010;  revision 2011   BLADDER SUSPENSION  2000   BREAST BIOPSY Left 10/2015   BREAST IMPLANT REMOVAL Bilateral 08/12/2017   Procedure: REMOVAL BILATERAL BREAST IMPLANTS;  Surgeon: Wallace Going, DO;  Location: [facility];  Service: Plastics;  Laterality: Bilateral;   BREAST RECONSTRUCTION WITH PLACEMENT OF TISSUE EXPANDER AND FLEX HD (ACELLULAR HYDRATED DERMIS) Bilateral 01/09/2016   BREAST RECONSTRUCTION WITH PLACEMENT OF TISSUE EXPANDER AND FLEX HD (ACELLULAR HYDRATED DERMIS) Bilateral 01/09/2016   Procedure: BREAST RECONSTRUCTION WITH PLACEMENT OF TISSUE EXPANDER AND FLEX HD (ACELLULAR HYDRATED DERMIS);  Surgeon: Loel Lofty Dillingham, DO;  Location: [facility];  Service: Plastics;  Laterality: Bilateral;   BREAST RECONSTRUCTION WITH PLACEMENT OF TISSUE EXPANDER AND FLEX HD (ACELLULAR HYDRATED DERMIS) Bilateral 05/29/2016   Procedure: PLACEMENT OF BILATERAL TISSUE EXPANDER AND FLEX HD (ACELLULAR HYDRATED  DERMIS);  Surgeon: Wallace Going, DO;  Location: [facility];  Service: Plastics;  Laterality: Bilateral;   BREAST REDUCTION SURGERY Bilateral 11/26/2016   Procedure: BILATERAL BREAST CAPSULE CONTRACTURE RELASE;  Surgeon: Wallace Going, DO;  Location: [facility];  Service: Plastics;  Laterality: Bilateral;   [place]; 1989; [place] Right x3   last one 02-01-2019 @ [facility]   EYE SURGERY  Left 10/01/2020   Pneumatic retinopexy for repair of rheg RD - Dr. Bernarda Caffey   EYE SURGERY Left 10/04/2020   PPV - Dr. Bernarda Caffey   FAT GRAFTING BILATERAL BREAST  08-09-2018  _0    GAS INSERTION Left 10/04/2020   Procedure: INSERTION OF GAS;  Surgeon: Bernarda Caffey, MD;  Location: [facility];  Service: Ophthalmology;  Laterality: Left;   GAS/FLUID EXCHANGE Left 10/04/2020   Procedure: GAS/FLUID EXCHANGE;  Surgeon: Bernarda Caffey, MD;  Location: [facility];  Service: Ophthalmology;  Laterality: Left;   INCISION AND DRAINAGE OF WOUND Bilateral 02/11/2016   Procedure: IRRIGATION AND DEBRIDEMENT OF BILATERAL BREAST POCKET;  Surgeon: Wallace Going, DO;  Location: [facility];  Service: Plastics;  Laterality: Bilateral;   KNEE ARTHROSCOPY WITH MEDIAL MENISECTOMY Right 12/20/2019   Procedure: KNEE ARTHROSCOPY WITH MEDIAL MENISECTOMY;  Surgeon: Marchia Bond, MD;  Location: [facility];  Service: Orthopedics;  Laterality: Right;   LAPAROSCOPIC APPENDECTOMY  04-07-2011   _1    w/ Excision peritoneal lipoma and lysis adhesions   LAPAROSCOPIC CHOLECYSTECTOMY  ~ [place] Right 10/04/2020   Procedure: LASER RETINOPEXY WITH INDIRECT LASER OPTHALMOSCOPE, RIGHT EYE;  Surgeon: Bernarda Caffey, MD;  Location: [place];  Service: Ophthalmology;  Laterality: Right;   LIPOSUCTION WITH LIPOFILLING Bilateral 11/26/2016   Procedure: LIPOFILLING FOR SYMMETRY;  Surgeon: Wallace Going, DO;  Location: [place];  Service: Plastics;  Laterality: Bilateral;   LIPOSUCTION WITH LIPOFILLING Bilateral 01/21/2017   Procedure: BILATERAL BREAST  LIPOFILLING FOR ASYMMETRY;  Surgeon: Wallace Going, DO;  Location: [facility];  Service: Plastics;  Laterality: Bilateral;   LIPOSUCTION WITH LIPOFILLING Bilateral 06/28/2020   Procedure: Lipofilling bilateral breasts for asymmetry;  Surgeon: Wallace Going, DO;  Location: [facility];   Service: Plastics;  Laterality: Bilateral;  90 min   MASTECTOMY Bilateral 01/09/2016   NIPPLE SPARING MASTECTOMY/SENTINAL LYMPH NODE BIOPSY/RECONSTRUCTION/PLACEMENT OF TISSUE EXPANDER Bilateral 01/09/2016   Procedure: BILATERAL NIPPLE SPARING MASTECTOMY WITH LEFT SENTINAL LYMPH NODE BIOPSY ;  Surgeon: Stark Klein, MD;  Location: [facility];  Service: General;  Laterality: Bilateral;   PHOTOCOAGULATION WITH LASER Left 10/04/2020   Procedure: PHOTOCOAGULATION WITH LASER;  Surgeon: Bernarda Caffey, MD;  Location: [facility];  Service: Ophthalmology;  Laterality: Left;   REMOVAL OF BILATERAL TISSUE EXPANDERS WITH PLACEMENT OF BILATERAL BREAST IMPLANTS Bilateral 08/20/2016   Procedure: REMOVAL OF BILATERAL TISSUE EXPANDERS WITH PLACEMENT OF BILATERAL SILICONE IMPLANTS;  Surgeon: Wallace Going, DO;  Location: [facility];  Service: Plastics;  Laterality: Bilateral;   REMOVAL OF BILATERAL TISSUE EXPANDERS WITH PLACEMENT OF BILATERAL BREAST IMPLANTS Bilateral 11/05/2017   Procedure: REMOVAL OF BILATERAL TISSUE EXPANDERS WITH PLACEMENT OF BILATERAL BREAST SILICONE IMPLANTS;  Surgeon: Wallace Going, DO;  Location: [facility];  Service: Plastics;  Laterality: Bilateral;   REMOVAL OF TISSUE EXPANDER Bilateral 02/11/2016   Procedure: REMOVAL OF BILATERAL TISSUE EXPANDERS AND FLEX HD REMOVAL;  Surgeon: Loel Lofty Dillingham, DO;  Location: [facility];  Service: Plastics;  Laterality: Bilateral;   RETINAL DETACHMENT SURGERY Left 10/01/2020   Pneumatic retinopexy for repair of rheg RD - Dr. Bernarda Caffey   RETINAL DETACHMENT SURGERY Left 10/04/2020   PPV - Dr. Bernarda Caffey   SCLERAL BUCKLE Left 10/04/2020   Procedure: SCLERAL BUCKLE LEFT EYE;  Surgeon: Bernarda Caffey, MD;  Location: [facility];  Service: Ophthalmology;  Laterality: Left;   TISSUE EXPANDER PLACEMENT Bilateral 08/12/2017   Procedure: PLACEMENT OF BILATERAL TISSUE EXPANDER;  Surgeon: Wallace Going, DO;  Location:  [facility];  Service: Plastics;  Laterality: Bilateral;   TUBAL LIGATION Bilateral 1991   VITRECTOMY 25 GAUGE WITH SCLERAL BUCKLE Left 10/04/2020   Procedure: 25 GAUGE PARS PLANA VITRECTOMY LEFT EYE ;  Surgeon: Bernarda Caffey, MD;  Location: [facility];  Service: Ophthalmology;  Laterality: Left;     OB History     Gravida  4   Para  3   Term      Preterm      AB      Living  3      SAB      IAB      Ectopic      Multiple      Live Births              Family History  Problem Relation Age of Onset   Heart failure Father    Prostate cancer Father 1   Retinal detachment Father    Colon polyps Mother        approx 2   Other Mother        hx HPV and hysterectomy due to precancerous cells   Depression Mother    Anxiety disorder Mother    Other Sister 7       hx of hysterectomy for unspecified reason; still has ovaries   Other Sister 36       paternal half-sister hx of hysterectomy for unspecified reason; still has ovaries   Bladder Cancer Maternal Uncle 79       not a smoker   Kidney failure Maternal Grandmother    Congestive Heart Failure Maternal Grandmother    Colon cancer Maternal Grandmother 70   Diabetes Maternal Grandmother    Lung cancer Maternal Grandfather 31       smoker   Breast cancer Paternal Grandmother        dx. early 87s; w/ hx of trauma to breast   Crohn's disease Daughter    Lung cancer Maternal Uncle 44       smoker    Social History   Tobacco Use   Smoking status: Former    Packs/day: 1.00    Years: 10.00    Pack years: 10.00    Types: Cigarettes    Quit date: 06/08/2008    Years since quitting: 12.8   Smokeless tobacco: Never  Vaping Use   Vaping Use: Never used  Substance Use Topics   Alcohol use: No   Drug use: Never    Home Medications Prior to Admission medications   Medication Sig Start Date End Date Taking? Authorizing Provider  ALPRAZolam Duanne Moron) 0.25 MG tablet Take 0.25 mg by mouth daily as  needed for anxiety. 07/20/20   [provider]  amitriptyline (ELAVIL) 100 MG tablet Take 0.5 tablets (50 mg total) by mouth at bedtime. 07/02/20   Opalski, Neoma Laming, DO  FLUoxetine (PROZAC) 40 MG capsule Take 40 mg by mouth daily.    [provider]  ondansetron (ZOFRAN ODT) 4 MG disintegrating tablet Take 1 tablet (4 mg total) by mouth every 8 (eight) hours as needed for nausea or vomiting. 01/07/21   Valarie Merino, MD  Semaglutide,0.25 or 0.5MG/DOS, (OZEMPIC, 0.25 OR 0.5 MG/DOSE,) 2 MG/1.5ML SOPN Inject 0.5 mg into the skin once a week. 03/20/21   Mellody Dance, DO  tamoxifen (NOLVADEX) 20 MG tablet Take 1 tablet (20 mg total) by mouth daily. 01/15/21   Nicholas Lose, MD  topiramate (TOPAMAX) 50 MG tablet 1 po twice daily 03/20/21   Opalski, Neoma Laming, DO  vitamin B-12 (CYANOCOBALAMIN) 500 MCG tablet Take 2 tablets (1,000 mcg total) by mouth daily. 12/03/20   Mellody Dance, DO  Vitamin D, Ergocalciferol, (DRISDOL) 1.25 MG (50000 UNIT) CAPS capsule 1 po q wed and 1 po q sun 03/20/21   Mellody Dance, DO    Allergies    Patient has no known allergies.  Review of Systems   Review of Systems  All other systems reviewed and are negative.  Physical Exam Updated Vital Signs BP (!) 142/86 (BP Location: Right Arm)   Pulse 66   Temp 97.7 F (36.5 C) (Oral)   Resp 16   Ht 5' 7" (1.702 m)   Wt 86.2 kg   LMP  (LMP Unknown)   SpO2 100%   BMI 29.76 kg/m   Physical Exam Vitals and nursing note reviewed.  Constitutional:      Appearance: She is well-developed.  HENT:     Head: Normocephalic and atraumatic.  Eyes:     Conjunctiva/sclera: Conjunctivae normal.     Pupils: Pupils are equal, round, and reactive to light.  Neck:     Trachea: Phonation normal.  Cardiovascular:     Rate and Rhythm: Normal rate.  Pulmonary:     Effort: Pulmonary effort is normal.  Chest:     Chest wall: No tenderness.  Musculoskeletal:        General: Normal range of motion.      Cervical back: Normal range of motion and neck supple.     Comments: Foreign body, fishhook, impaled in pulp of left thumb.  No other injury.  Skin:    General: Skin is warm and dry.  Neurological:     Mental Status: She is alert and oriented to person, place, and time.     Motor: No abnormal muscle tone.  Psychiatric:        Mood and Affect: Mood normal.        Behavior: Behavior normal.        Thought Content: Thought content normal.        Judgment: Judgment normal.    ED Results / Procedures / Treatments   Labs (all labs ordered are listed, but only abnormal results are displayed) Labs Reviewed - No data to display  EKG None  Radiology No results found.  Procedures .Foreign Body Removal  Date/Time: 04/08/2021 10:18 AM Performed by: Daleen Bo, MD Authorized by: Daleen Bo, MD  Consent: Verbal consent obtained. Consent given by: patient Intake: Left thumb. Anesthesia: local infiltration  Anesthesia: Local Anesthetic: lidocaine 1% without epinephrine Anesthetic total: 2 mL  Sedation: Patient sedated: no  Patient restrained: no Complexity: simple 1 objects recovered. Objects recovered: Fishhook Post-procedure assessment: foreign body removed Patient tolerance: patient tolerated the procedure well with no immediate complications Comments: Like removed by backing it out with 18-gauge needle over the barb.  Irrigated with saline after removal of hook.    Medications Ordered in  ED Medications  lidocaine (PF) (XYLOCAINE) 1 % injection 10 mL (has no administration in time range)    ED Course  I have reviewed the triage vital signs and the nursing notes.  Pertinent labs & imaging results that were available during my care of the patient were reviewed by me and considered in my medical decision making (see chart for details).    MDM Rules/Calculators/A&P                            Patient Vitals for the past 24 hrs:  BP Temp Temp src Pulse Resp SpO2  Height Weight  04/08/21 0849 -- -- -- -- -- -- 5' 7" (1.702 m) 86.2 kg  04/08/21 0847 (!) 142/86 97.7 F (36.5 C) Oral 66 16 100 % -- --    10:17 AM Reevaluation with update and discussion. After initial assessment and treatment, an updated evaluation reveals no further complaints, findings discussed and questions answered. Daleen Bo   Medical Decision Making:  This patient is presenting for evaluation of fishhook impaled in the, which does require a range of treatment options, and is a complaint that involves a moderate risk of morbidity and mortality.    Critical Interventions-clinical evaluation, fishhook removal, see procedure note  After These Interventions, the Patient was reevaluated and was found stable for discharge.  No indication for antibiotics.  Tetanus up-to-date.  CRITICAL CARE-no Performed by: Daleen Bo  Nursing Notes Reviewed/ Care Coordinated Applicable Imaging Reviewed Interpretation of Laboratory Data incorporated into ED treatment  The patient appears reasonably screened and/or stabilized for discharge and I doubt any other medical condition or other [facility] requiring further screening, evaluation, or treatment in the ED at this time prior to discharge.  Plan: Home Medications-Tylenol for pain; Home Treatments-wound care soaks 2-3 times a day for healing; return here if the recommended treatment, does not improve the symptoms; Recommended follow up-PCP,.     Final Clinical Impression(s) / ED Diagnoses Final diagnoses:  Fishhook injury to finger, left, initial encounter    Rx / DC Orders ED Discharge Orders     None        Daleen Bo, MD 04/08/21 1020

## 2021-04-08 NOTE — Discharge Instructions (Addendum)
Soak the wound in warm water 2 or 3 times a day for 20 to 30 minutes.  Keep it clean with soap and water.  Follow-up with your doctor if not better in 2 or 3 days.

## 2021-04-08 NOTE — ED Triage Notes (Signed)
Patient has a fish hook in the left thumb.

## 2021-04-08 NOTE — Telephone Encounter (Signed)
Pt states she still has the knot in her left armpit. Has not changed over weekend. Message to Dr Lindi Adie

## 2021-04-09 ENCOUNTER — Other Ambulatory Visit: Payer: Self-pay

## 2021-04-09 DIAGNOSIS — C50112 Malignant neoplasm of central portion of left female breast: Secondary | ICD-10-CM

## 2021-04-09 DIAGNOSIS — Z170 Estrogen receptor positive status [ER+]: Secondary | ICD-10-CM

## 2021-04-09 DIAGNOSIS — R2232 Localized swelling, mass and lump, left upper limb: Secondary | ICD-10-CM

## 2021-04-09 NOTE — Progress Notes (Signed)
MD recommendations to obtain ultrasound to left axillary.    Patient scheduled for 8/15 @ 12:40, with arrival time of 12:20 @ [facility]. Patient notified.

## 2021-04-10 ENCOUNTER — Other Ambulatory Visit: Payer: Self-pay | Admitting: Hematology and Oncology

## 2021-04-10 ENCOUNTER — Ambulatory Visit (INDEPENDENT_AMBULATORY_CARE_PROVIDER_SITE_OTHER): Payer: 59 | Admitting: Family Medicine

## 2021-04-10 ENCOUNTER — Other Ambulatory Visit: Payer: Self-pay

## 2021-04-10 ENCOUNTER — Encounter (INDEPENDENT_AMBULATORY_CARE_PROVIDER_SITE_OTHER): Payer: Self-pay | Admitting: Family Medicine

## 2021-04-10 VITALS — BP 115/72 | HR 64 | Temp 97.7°F | Ht 67.0 in | Wt 196.0 lb

## 2021-04-10 DIAGNOSIS — Z9189 Other specified personal risk factors, not elsewhere classified: Secondary | ICD-10-CM

## 2021-04-10 DIAGNOSIS — E559 Vitamin D deficiency, unspecified: Secondary | ICD-10-CM | POA: Diagnosis not present

## 2021-04-10 DIAGNOSIS — E538 Deficiency of other specified B group vitamins: Secondary | ICD-10-CM

## 2021-04-10 DIAGNOSIS — F39 Unspecified mood [affective] disorder: Secondary | ICD-10-CM | POA: Diagnosis not present

## 2021-04-10 DIAGNOSIS — R7303 Prediabetes: Secondary | ICD-10-CM

## 2021-04-10 DIAGNOSIS — E669 Obesity, unspecified: Secondary | ICD-10-CM

## 2021-04-10 DIAGNOSIS — Z6833 Body mass index (BMI) 33.0-33.9, adult: Secondary | ICD-10-CM

## 2021-04-10 MED ORDER — TOPIRAMATE 50 MG PO TABS: ORAL_TABLET | ORAL | 0 refills | Status: DC

## 2021-04-10 MED ORDER — VITAMIN D (ERGOCALCIFEROL) 1.25 MG (50000 UNIT) PO CAPS: ORAL_CAPSULE | ORAL | 0 refills | Status: DC

## 2021-04-10 MED ORDER — SEMAGLUTIDE (1 MG/DOSE) 4 MG/3ML ~~LOC~~ SOPN: 1.0000 mg | PEN_INJECTOR | SUBCUTANEOUS | 0 refills | Status: DC

## 2021-04-11 LAB — CBC WITH DIFFERENTIAL/PLATELET
Basophils Absolute: 0 10*3/uL (ref 0.0–0.2)
Basos: 1 %
EOS (ABSOLUTE): 0.2 10*3/uL (ref 0.0–0.4)
Eos: 3 %
Hematocrit: 43.1 % (ref 34.0–46.6)
Hemoglobin: 13.5 g/dL (ref 11.1–15.9)
Immature Grans (Abs): 0 10*3/uL (ref 0.0–0.1)
Immature Granulocytes: 0 %
Lymphocytes Absolute: 2 10*3/uL (ref 0.7–3.1)
Lymphs: 27 %
MCH: 27.6 pg (ref 26.6–33.0)
MCHC: 31.3 g/dL — ABNORMAL LOW (ref 31.5–35.7)
MCV: 88 fL (ref 79–97)
Monocytes Absolute: 0.7 10*3/uL (ref 0.1–0.9)
Monocytes: 10 %
Neutrophils Absolute: 4.4 10*3/uL (ref 1.4–7.0)
Neutrophils: 59 %
Platelets: 305 10*3/uL (ref 150–450)
RBC: 4.89 x10E6/uL (ref 3.77–5.28)
RDW: 13.8 % (ref 11.7–15.4)
WBC: 7.4 10*3/uL (ref 3.4–10.8)

## 2021-04-11 LAB — COMPREHENSIVE METABOLIC PANEL
ALT: 21 IU/L (ref 0–32)
AST: 14 IU/L (ref 0–40)
Albumin/Globulin Ratio: 1.8 (ref 1.2–2.2)
Albumin: 4.2 g/dL (ref 3.8–4.9)
Alkaline Phosphatase: 75 IU/L (ref 44–121)
BUN/Creatinine Ratio: 21 (ref 9–23)
BUN: 15 mg/dL (ref 6–24)
Bilirubin Total: <0.2 mg/dL (ref 0.0–1.2)
CO2: 22 mmol/L (ref 20–29)
Calcium: 10.3 mg/dL — ABNORMAL HIGH (ref 8.7–10.2)
Chloride: 108 mmol/L — ABNORMAL HIGH (ref 96–106)
Creatinine, Ser: 0.73 mg/dL (ref 0.57–1.00)
Globulin, Total: 2.4 g/dL (ref 1.5–4.5)
Glucose: 87 mg/dL (ref 65–99)
Potassium: 4.9 mmol/L (ref 3.5–5.2)
Sodium: 140 mmol/L (ref 134–144)
Total Protein: 6.6 g/dL (ref 6.0–8.5)
eGFR: 98 mL/min/{1.73_m2} (ref >59)

## 2021-04-11 LAB — VITAMIN B12: Vitamin B-12: 243 pg/mL (ref 232–1245)

## 2021-04-11 LAB — VITAMIN D 25 HYDROXY (VIT D DEFICIENCY, FRACTURES): Vit D, 25-Hydroxy: 51.4 ng/mL (ref 30.0–100.0)

## 2021-04-11 LAB — HEMOGLOBIN A1C
Est. average glucose Bld gHb Est-mCnc: 120 mg/dL
Hgb A1c MFr Bld: 5.8 % — ABNORMAL HIGH (ref 4.8–5.6)

## 2021-04-11 NOTE — Progress Notes (Signed)
Chief Complaint:   OBESITY Christina [name] is here to discuss her progress with her obesity treatment plan along with follow-up of her obesity related diagnoses. Christina [name] is on the Category 3 Plan and states she is following her eating plan approximately 20% of the time. Christina [name] states she is walking 15 minutes 5 times per week.  Today's visit was #: 15 Starting weight: 218 lbs Starting date: 05/15/2020 Today's weight: 196 lbs Today's date: 04/10/2021 Total lbs lost to date: 22 Total lbs lost since last in-office visit: 1  Interim History: Christina [name] was on vacation in [place]. She is happy she lost weight but ate "all comfort foods". She denies issues with plan but "just needs to get back on it".  Subjective:   1. Pre-diabetes Christina [name] denies side effects of Ozempic 0.5 mg. She is tolerating it well and would like to increase the dose.  Lab Results  Component Value Date   HGBA1C 5.8 (H) 04/10/2021   Lab Results  Component Value Date   INSULIN 18.4 11/13/2020   INSULIN 34.7 (H) 05/15/2020   2. Vitamin D deficiency She is currently taking prescription vitamin D 50,000 IU each week. She denies nausea, vomiting or muscle weakness.  Lab Results  Component Value Date   VD25OH 51.4 04/10/2021   VD25OH 31.3 11/13/2020   VD25OH 21.2 (L) 05/15/2020   3. Vitamin B12 deficiency Christina [name] hasn't been as consistent with her daily B12 supplement as she should be. She reports fatigue.  Lab Results  Component Value Date   VITAMINB12 243 04/10/2021   4. Mood disorder, with emotional eating Christina [name] denies side effects of topiramate and is tolerating it well. She doesn't crave as much as usual.  Assessment/Plan:   1. Pre-diabetes Christina [name] will continue to work on weight loss, exercise, and decreasing simple carbohydrates to help decrease the risk of diabetes. Check labs today.  Refill- Semaglutide, 1 MG/DOSE, 4 MG/3ML SOPN; Inject 1 mg as directed once a week.  Dispense: 3 mL; Refill: 0  -  Hemoglobin A1c - CBC with Differential/Platelet - Comprehensive metabolic panel  2. Vitamin D deficiency Low Vitamin D level contributes to fatigue and are associated with obesity, breast, and colon cancer. She agrees to continue to take prescription Vitamin D '@50'$ ,000 IU every week and will follow-up for routine testing of Vitamin D, at least 2-3 times per year to avoid over-replacement. Check labs today.  Refill- Vitamin D, Ergocalciferol, (DRISDOL) 1.25 MG (50000 UNIT) CAPS capsule; 1 po q wed and 1 po q sun  Dispense: 8 capsule; Refill: 0  - VITAMIN D 25 Hydroxy (Vit-D Deficiency, Fractures)  3. Vitamin B12 deficiency The diagnosis was reviewed with the patient. Counseling provided today, see below. We will continue to monitor. Orders and follow up as documented in patient record.  Counseling The body needs vitamin B12: to make red blood cells; to make DNA; and to help the nerves work properly so they can carry messages from the brain to the body.  The main causes of vitamin B12 deficiency include dietary deficiency, digestive diseases, pernicious anemia, and having a surgery in which part of the stomach or small intestine is removed.  Certain medicines can make it harder for the body to absorb vitamin B12. These medicines include: heartburn medications; some antibiotics; some medications used to treat diabetes, gout, and high cholesterol.  In some cases, there are no symptoms of this condition. If the condition leads to anemia or nerve damage, various symptoms can occur, such as weakness  or fatigue, shortness of breath, and numbness or tingling in your hands and feet.   Treatment:  May include taking vitamin B12 supplements.  Avoid alcohol.  Eat lots of healthy foods that contain vitamin B12: Beef, pork, chicken, [place], and organ meats, such as liver.  Seafood: This includes clams, rainbow trout, salmon, tuna, and haddock. Eggs.  Cereal and dairy products that are fortified: This means  that vitamin B12 has been added to the food.  Check labs today.  - Vitamin B12  4. Mood disorder, with emotional eating Behavior modification techniques were discussed today to help Christina [name] deal with her emotional/non-hunger eating behaviors.  Orders and follow up as documented in patient record.   Refill- topiramate (TOPAMAX) 50 MG tablet; 1 po twice daily  Dispense: 60 tablet; Refill: 0  - CBC with Differential/Platelet - Comprehensive metabolic panel  5. At risk for malnutrition Christina [name] was given extensive malnutrition prevention education and counseling today of more than 12 minutes.  Counseled her that malnutrition refers to inappropriate nutrients or not the right balance of nutrients for optimal health.  Discussed with Christina [name] that it is absolutely possible to be malnourished but yet obese.  Risk factors, including but not limited to, inappropriate dietary choices, difficulty with obtaining food due to physical or financial limitations, and various physical and mental health conditions were reviewed with Christina [name].   6. Obesity with current BMI of 30.7  Christina [name] is currently in the action stage of change. As such, her goal is to continue with weight loss efforts. She has agreed to the Category 3 Plan.   Exercise goals:  As is  Behavioral modification strategies: increasing lean protein intake, decreasing simple carbohydrates, and planning for success.  Christina [name] has agreed to follow-up with our clinic in 3 weeks. She was informed of the importance of frequent follow-up visits to maximize her success with intensive lifestyle modifications for her multiple health conditions.   Christina [name] was informed we would discuss her lab results at her next visit unless there is a critical issue that needs to be addressed sooner. Christina [name] agreed to keep her next visit at the agreed upon time to discuss these results.  Objective:   Blood pressure 115/72, pulse 64, temperature 97.7 F (36.5 C),  height '5\' 7"'$  (1.702 m), weight 196 lb (88.9 kg), SpO2 99 %. Body mass index is 30.7 kg/m.  General: Cooperative, alert, well developed, in no acute distress. HEENT: Conjunctivae and lids unremarkable. Cardiovascular: Regular rhythm.  Lungs: Normal work of breathing. Neurologic: No focal deficits.   Lab Results  Component Value Date   CREATININE 0.73 04/10/2021   BUN 15 04/10/2021   NA 140 04/10/2021   K 4.9 04/10/2021   CL 108 (H) 04/10/2021   CO2 22 04/10/2021   Lab Results  Component Value Date   ALT 21 04/10/2021   AST 14 04/10/2021   ALKPHOS 75 04/10/2021   BILITOT <0.2 04/10/2021   Lab Results  Component Value Date   HGBA1C 5.8 (H) 04/10/2021   HGBA1C 6.0 (H) 11/13/2020   HGBA1C 6.3 (H) 05/15/2020   Lab Results  Component Value Date   INSULIN 18.4 11/13/2020   INSULIN 34.7 (H) 05/15/2020   Lab Results  Component Value Date   TSH 1.730 05/15/2020   Lab Results  Component Value Date   CHOL 125 05/15/2020   HDL 36 (L) 05/15/2020   LDLCALC 71 05/15/2020   TRIG 95 05/15/2020   Lab Results  Component Value Date  VD25OH 51.4 04/10/2021   VD25OH 31.3 11/13/2020   VD25OH 21.2 (L) 05/15/2020   Lab Results  Component Value Date   WBC 7.4 04/10/2021   HGB 13.5 04/10/2021   HCT 43.1 04/10/2021   MCV 88 04/10/2021   PLT 305 04/10/2021    Attestation Statements:   Reviewed by clinician on day of visit: allergies, medications, problem list, medical history, surgical history, family history, social history, and previous encounter notes.  Coral Ceo, CMA, am acting as transcriptionist for [employer], DO.  I have reviewed the above documentation for accuracy and completeness, and I agree with the above. Marjory Sneddon, D.O.  The [place] was signed into law in 2016 which includes the topic of electronic health records.  This provides immediate access to information in MyChart.  This includes consultation notes, operative  notes, office notes, lab results and pathology reports.  If you have any questions about what you read please let us know at your next visit so we can discuss your concerns and take corrective action if need be.  We are right here with you.

## 2021-04-18 ENCOUNTER — Ambulatory Visit
Admission: RE | Admit: 2021-04-18 | Discharge: 2021-04-18 | Disposition: A | Discharge status: Home / Self Care | Payer: 59 | Source: Ambulatory Visit | Attending: Hematology and Oncology | Admitting: Hematology and Oncology

## 2021-04-18 ENCOUNTER — Other Ambulatory Visit: Payer: Self-pay

## 2021-04-18 DIAGNOSIS — R2232 Localized swelling, mass and lump, left upper limb: Secondary | ICD-10-CM

## 2021-04-18 DIAGNOSIS — C50112 Malignant neoplasm of central portion of left female breast: Secondary | ICD-10-CM

## 2021-04-22 ENCOUNTER — Other Ambulatory Visit: Payer: 59

## 2021-05-01 ENCOUNTER — Other Ambulatory Visit: Payer: Self-pay

## 2021-05-01 ENCOUNTER — Ambulatory Visit (INDEPENDENT_AMBULATORY_CARE_PROVIDER_SITE_OTHER): Payer: 59 | Admitting: Family Medicine

## 2021-05-01 ENCOUNTER — Encounter (INDEPENDENT_AMBULATORY_CARE_PROVIDER_SITE_OTHER): Payer: Self-pay | Admitting: Family Medicine

## 2021-05-01 VITALS — BP 117/72 | HR 74 | Temp 98.3°F | Ht 67.0 in | Wt 198.0 lb

## 2021-05-01 DIAGNOSIS — F39 Unspecified mood [affective] disorder: Secondary | ICD-10-CM

## 2021-05-01 DIAGNOSIS — R7303 Prediabetes: Secondary | ICD-10-CM

## 2021-05-01 DIAGNOSIS — Z9189 Other specified personal risk factors, not elsewhere classified: Secondary | ICD-10-CM | POA: Diagnosis not present

## 2021-05-01 DIAGNOSIS — E538 Deficiency of other specified B group vitamins: Secondary | ICD-10-CM | POA: Diagnosis not present

## 2021-05-01 DIAGNOSIS — E559 Vitamin D deficiency, unspecified: Secondary | ICD-10-CM

## 2021-05-01 DIAGNOSIS — Z6833 Body mass index (BMI) 33.0-33.9, adult: Secondary | ICD-10-CM

## 2021-05-01 DIAGNOSIS — E669 Obesity, unspecified: Secondary | ICD-10-CM

## 2021-05-01 MED ORDER — TOPIRAMATE 50 MG PO TABS: ORAL_TABLET | ORAL | 0 refills | Status: DC

## 2021-05-01 MED ORDER — SEMAGLUTIDE (1 MG/DOSE) 4 MG/3ML ~~LOC~~ SOPN: 1.0000 mg | PEN_INJECTOR | SUBCUTANEOUS | 0 refills | Status: DC

## 2021-05-01 MED ORDER — CYANOCOBALAMIN 500 MCG PO TABS: 500.0000 ug | ORAL_TABLET | Freq: Every day | ORAL | Status: DC

## 2021-05-01 MED ORDER — VITAMIN D (ERGOCALCIFEROL) 1.25 MG (50000 UNIT) PO CAPS: ORAL_CAPSULE | ORAL | 0 refills | Status: DC

## 2021-05-06 NOTE — Progress Notes (Signed)
Chief Complaint:   OBESITY Christina [name] is here to discuss her progress with her obesity treatment plan along with follow-up of her obesity related diagnoses. Christina [name] is on the Category 3 Plan and states she is following her eating plan approximately 50% of the time. Christina [name] states she is walking 20 minutes 3 times per week.  Today's visit was #: 16 Starting weight: 218 lbs Starting date: 05/15/2020 Today's weight: 198 lbs Today's date: 05/01/2021 Total lbs lost to date: 20 Total lbs lost since last in-office visit: +2  Interim History: Christina [name] is up 2 lbs from her last OV on 04/10/2021, but was at [facility] on vacation. She hasn't been following the meal plan at all.   Assessment/Plan:  No orders of the defined types were placed in this encounter.   Medications Discontinued During This Encounter  Medication Reason   vitamin B-12 (CYANOCOBALAMIN) 500 MCG tablet Reorder   topiramate (TOPAMAX) 50 MG tablet Reorder   Vitamin D, Ergocalciferol, (DRISDOL) 1.25 MG (50000 UNIT) CAPS capsule Reorder   Semaglutide, 1 MG/DOSE, 4 MG/3ML SOPN Reorder     Meds ordered this encounter  Medications   Semaglutide, 1 MG/DOSE, 4 MG/3ML SOPN    Sig: Inject 1 mg as directed once a week.    Dispense:  3 mL    Refill:  0    30 d supply, ov for RF   topiramate (TOPAMAX) 50 MG tablet    Sig: 1 po twice daily    Dispense:  60 tablet    Refill:  0   Vitamin D, Ergocalciferol, (DRISDOL) 1.25 MG (50000 UNIT) CAPS capsule    Sig: 1 po q wed and 1 po q sun    Dispense:  8 capsule    Refill:  0   vitamin B-12 (CYANOCOBALAMIN) 500 MCG tablet    Sig: Take 1 tablet (500 mcg total) by mouth daily.     1. Pre-diabetes Discussed labs with patient today. Christina [name] started increased dose of Ozempic at last OV and is tolerating it well. She can't tell a difference in appetite but is not eating on plan. Not at goal. Goal is HgbA1c < 5.7.  Medication: Ozempic.    Plan:  She will continue 1 mg Ozempic. A1c is  improving. Also continue to focus on prudent nutritional plan, weight loss, protein-rich, low simple carbohydrate foods. We reviewed the importance of hydration, regular exercise for stress reduction, and restorative sleep.   Lab Results  Component Value Date   HGBA1C 5.8 (H) 04/10/2021   Lab Results  Component Value Date   INSULIN 18.4 11/13/2020   INSULIN 34.7 (H) 05/15/2020   Refill- Semaglutide, 1 MG/DOSE, 4 MG/3ML SOPN; Inject 1 mg as directed once a week.  Dispense: 3 mL; Refill: 0  2. Mood disorder, with emotional eating Controlled. Medication: Topamax. Christina [name] is tolerating medication(s) well without side effects.  Medication compliance is good and patient appears to be taking it as prescribed.  Denies additional concerns regarding this condition.   Plan:  Behavior modification techniques were discussed today to help deal with emotional/non-hunger eating behaviors.  Refill- topiramate (TOPAMAX) 50 MG tablet; 1 po twice daily  Dispense: 60 tablet; Refill: 0  3. Vitamin B12 deficiency Discussed labs with patient today. B12 is not at goal. Pt is not taking supplement as directed.   Lab Results  Component Value Date   VITAMINB12 243 04/10/2021   Supplementation: Vit B12. Start Vit B12 and use regularly at 500 mcg QD.  Start- vitamin B-12 (CYANOCOBALAMIN) 500 MCG tablet; Take 1 tablet (500 mcg total) by mouth daily.  4. Vitamin D deficiency At goal. Christina [name] is tolerating medication(s) well without side effects.  Medication compliance is good and patient appears to be taking it as prescribed.  Denies additional concerns regarding this condition.   Plan: Continue to take prescription Vitamin D '@50'$ ,000 IU every week as prescribed.  Follow-up for routine testing of Vitamin D, at least 2-3 times per year to avoid over-replacement.  Lab Results  Component Value Date   VD25OH 51.4 04/10/2021   VD25OH 31.3 11/13/2020   VD25OH 21.2 (L) 05/15/2020   Refill- Vitamin  D, Ergocalciferol, (DRISDOL) 1.25 MG (50000 UNIT) CAPS capsule; 1 po q wed and 1 po q sun  Dispense: 8 capsule; Refill: 0  5. At risk for diabetes mellitus Christina [name] is at risk for diabetes mellitus due to inactivity, pre-diabetes, and obesity. - Christina [name] was given diabetes prevention education and counseling today of more than 23 minutes.  - Counseled patient on pathophysiology of disease and meaning/ implication of lab results.  - Reviewed how certain foods can either stimulate or inhibit insulin release, and subsequently affect hunger pathways  - Importance of following a healthy meal plan with limiting amounts of simple carbohydrates discussed with patient - Effects of regular aerobic exercise on blood sugar regulation reviewed and encouraged an eventual goal of 30 min 5d/week or more as a minimum.  - Briefly discussed treatment options, which always include dietary and lifestyle modification as first line.   - Handouts provided at patient's desire and/or told to go online to the American Diabetes Association website for further information.  6. Obesity with current BMI of 31.1  Christina [name] is currently in the action stage of change. As such, her goal is to continue with weight loss efforts. She has agreed to the Category 3 Plan.   Handout: Category 3 meal plan provided again, per pt request. Increase exercise to 30 minutes 7 days a week.  Exercise goals: For substantial health benefits, adults should do at least 150 minutes (2 hours and 30 minutes) a week of moderate-intensity, or 75 minutes (1 hour and 15 minutes) a week of vigorous-intensity aerobic physical activity, or an equivalent combination of moderate- and vigorous-intensity aerobic activity. Aerobic activity should be performed in episodes of at least 10 minutes, and preferably, it should be spread throughout the week.  Behavioral modification strategies: increasing lean protein intake and decreasing simple carbohydrates.  Christina [name] has  agreed to follow-up with our clinic in 2-3 weeks. She was informed of the importance of frequent follow-up visits to maximize her success with intensive lifestyle modifications for her multiple health conditions.   Objective:   Blood pressure 117/72, pulse 74, temperature 98.3 F (36.8 C), height '5\' 7"'$  (1.702 m), weight 198 lb (89.8 kg), SpO2 97 %. Body mass index is 31.01 kg/m.  General: Cooperative, alert, well developed, in no acute distress. HEENT: Conjunctivae and lids unremarkable. Cardiovascular: Regular rhythm.  Lungs: Normal work of breathing. Neurologic: No focal deficits.   Lab Results  Component Value Date   CREATININE 0.73 04/10/2021   BUN 15 04/10/2021   NA 140 04/10/2021   K 4.9 04/10/2021   CL 108 (H) 04/10/2021   CO2 22 04/10/2021   Lab Results  Component Value Date   ALT 21 04/10/2021   AST 14 04/10/2021   ALKPHOS 75 04/10/2021   BILITOT <0.2 04/10/2021   Lab Results  Component Value Date  HGBA1C 5.8 (H) 04/10/2021   HGBA1C 6.0 (H) 11/13/2020   HGBA1C 6.3 (H) 05/15/2020   Lab Results  Component Value Date   INSULIN 18.4 11/13/2020   INSULIN 34.7 (H) 05/15/2020   Lab Results  Component Value Date   TSH 1.730 05/15/2020   Lab Results  Component Value Date   CHOL 125 05/15/2020   HDL 36 (L) 05/15/2020   LDLCALC 71 05/15/2020   TRIG 95 05/15/2020   Lab Results  Component Value Date   VD25OH 51.4 04/10/2021   VD25OH 31.3 11/13/2020   VD25OH 21.2 (L) 05/15/2020   Lab Results  Component Value Date   WBC 7.4 04/10/2021   HGB 13.5 04/10/2021   HCT 43.1 04/10/2021   MCV 88 04/10/2021   PLT 305 04/10/2021   No results found for: IRON, TIBC, FERRITIN Attestation Statements:   Reviewed by clinician on day of visit: allergies, medications, problem list, medical history, surgical history, family history, social history, and previous encounter notes.  Coral Ceo, CMA, am acting as transcriptionist for [employer], DO.  I have  reviewed the above documentation for accuracy and completeness, and I agree with the above. Marjory Sneddon, D.O.  The [place] was signed into law in 2016 which includes the topic of electronic health records.  This provides immediate access to information in MyChart.  This includes consultation notes, operative notes, office notes, lab results and pathology reports.  If you have any questions about what you read please let us know at your next visit so we can discuss your concerns and take corrective action if need be.  We are right here with you.

## 2021-05-20 ENCOUNTER — Encounter (INDEPENDENT_AMBULATORY_CARE_PROVIDER_SITE_OTHER): Payer: 59 | Admitting: Ophthalmology

## 2021-05-20 DIAGNOSIS — H25811 Combined forms of age-related cataract, right eye: Secondary | ICD-10-CM

## 2021-05-20 DIAGNOSIS — H3322 Serous retinal detachment, left eye: Secondary | ICD-10-CM

## 2021-05-20 DIAGNOSIS — H33321 Round hole, right eye: Secondary | ICD-10-CM

## 2021-05-20 DIAGNOSIS — E119 Type 2 diabetes mellitus without complications: Secondary | ICD-10-CM

## 2021-05-20 DIAGNOSIS — H3581 Retinal edema: Secondary | ICD-10-CM

## 2021-05-20 DIAGNOSIS — H25813 Combined forms of age-related cataract, bilateral: Secondary | ICD-10-CM

## 2021-05-20 DIAGNOSIS — Z961 Presence of intraocular lens: Secondary | ICD-10-CM

## 2021-05-23 ENCOUNTER — Ambulatory Visit (INDEPENDENT_AMBULATORY_CARE_PROVIDER_SITE_OTHER): Payer: 59 | Admitting: Physician Assistant

## 2021-06-03 ENCOUNTER — Encounter (INDEPENDENT_AMBULATORY_CARE_PROVIDER_SITE_OTHER): Payer: Self-pay | Admitting: Physician Assistant

## 2021-06-03 ENCOUNTER — Other Ambulatory Visit: Payer: Self-pay

## 2021-06-03 ENCOUNTER — Ambulatory Visit (INDEPENDENT_AMBULATORY_CARE_PROVIDER_SITE_OTHER): Payer: 59 | Admitting: Physician Assistant

## 2021-06-03 VITALS — BP 111/73 | HR 66 | Temp 97.6°F | Ht 67.0 in | Wt 194.0 lb

## 2021-06-03 DIAGNOSIS — E669 Obesity, unspecified: Secondary | ICD-10-CM | POA: Diagnosis not present

## 2021-06-03 DIAGNOSIS — Z6833 Body mass index (BMI) 33.0-33.9, adult: Secondary | ICD-10-CM

## 2021-06-03 DIAGNOSIS — R7303 Prediabetes: Secondary | ICD-10-CM | POA: Diagnosis not present

## 2021-06-03 DIAGNOSIS — E559 Vitamin D deficiency, unspecified: Secondary | ICD-10-CM | POA: Diagnosis not present

## 2021-06-03 DIAGNOSIS — Z9189 Other specified personal risk factors, not elsewhere classified: Secondary | ICD-10-CM | POA: Diagnosis not present

## 2021-06-03 DIAGNOSIS — F39 Unspecified mood [affective] disorder: Secondary | ICD-10-CM | POA: Diagnosis not present

## 2021-06-03 MED ORDER — TOPIRAMATE 50 MG PO TABS: ORAL_TABLET | ORAL | 0 refills | Status: DC

## 2021-06-03 MED ORDER — SEMAGLUTIDE (1 MG/DOSE) 4 MG/3ML ~~LOC~~ SOPN: 1.0000 mg | PEN_INJECTOR | SUBCUTANEOUS | 0 refills | Status: DC

## 2021-06-03 MED ORDER — VITAMIN D (ERGOCALCIFEROL) 1.25 MG (50000 UNIT) PO CAPS: ORAL_CAPSULE | ORAL | 0 refills | Status: DC

## 2021-06-03 NOTE — Progress Notes (Signed)
Chief Complaint:   OBESITY Christina [name] is here to discuss her progress with her obesity treatment plan along with follow-up of her obesity related diagnoses. Christina [name] is on the Category 3 Plan and states she is following her eating plan approximately 80% of the time. Christina [name] states she is walking for 20 minutes 4 times per week.  Today's visit was #: 89 Starting weight: 218 lbs Starting date: 05/15/2020 Today's weight: 194 lbs Today's date: 06/03/2021 Total lbs lost to date: 24 Total lbs lost since last in-office visit: 4  Interim History: Christina [name] reports that she has been eating more on her plan since returning from the beach. She reports hunger throughout the day, but she is unsure if she is eating all of her food.  Subjective:   1. Pre-diabetes Christina [name] is on Ozempic 1 mg, and she notes it is not controlling her appetite as much as it was initially.  2. Vitamin D deficiency Christina [name] is on Vit D twice weekly.  3. Mood disorder, with emotional eating Christina [name] is on Topamax and she feels it helps with cravings sometimes.  4. At risk for diabetes mellitus Christina [name] is at higher than average risk for developing diabetes due to obesity.   Assessment/Plan:   1. Pre-diabetes Christina [name] will continue to work on weight loss, exercise, and decreasing simple carbohydrates to help decrease the risk of diabetes. We will refill Ozempic for 1 month.  - Semaglutide, 1 MG/DOSE, 4 MG/3ML SOPN; Inject 1 mg as directed once a week.  Dispense: 3 mL; Refill: 0  2. Vitamin D deficiency Low Vitamin D level contributes to fatigue and are associated with obesity, breast, and colon cancer. We will refill prescription Vitamin D for 1 month. Christina [name] will follow-up for routine testing of Vitamin D, at least 2-3 times per year to avoid over-replacement.  - Vitamin D, Ergocalciferol, (DRISDOL) 1.25 MG (50000 UNIT) CAPS capsule; 1 po q wed and 1 po q sun  Dispense: 8 capsule; Refill: 0  3. Mood disorder, with emotional  eating Behavior modification techniques were discussed today to help Christina [name] deal with her emotional/non-hunger eating behaviors. We will refill Topamax for 1 month. Orders and follow up as documented in patient record.   - topiramate (TOPAMAX) 50 MG tablet; 1 po twice daily  Dispense: 60 tablet; Refill: 0  4. At risk for diabetes mellitus Christina [name] was given approximately 15 minutes of diabetes education and counseling today. We discussed intensive lifestyle modifications today with an emphasis on weight loss as well as increasing exercise and decreasing simple carbohydrates in her diet. We also reviewed medication options with an emphasis on risk versus benefit of those discussed.   Repetitive spaced learning was employed today to elicit superior memory formation and behavioral change.  5. Obesity with current BMI of 30.38 Christina [name] is currently in the action stage of change. As such, her goal is to continue with weight loss efforts. She has agreed to the Category 3 Plan.   Exercise goals: As is.  Behavioral modification strategies: increasing lean protein intake and meal planning and cooking strategies.  Christina [name] has agreed to follow-up with our clinic in 3 weeks. She was informed of the importance of frequent follow-up visits to maximize her success with intensive lifestyle modifications for her multiple health conditions.   Objective:   Blood pressure 111/73, pulse 66, temperature 97.6 F (36.4 C), height 5\' 7"  (1.702 m), weight 194 lb (88 kg), SpO2 96 %. Body mass index is 30.38 kg/m.  General: Cooperative, alert,  well developed, in no acute distress. HEENT: Conjunctivae and lids unremarkable. Cardiovascular: Regular rhythm.  Lungs: Normal work of breathing. Neurologic: No focal deficits.   Lab Results  Component Value Date   CREATININE 0.73 04/10/2021   BUN 15 04/10/2021   NA 140 04/10/2021   K 4.9 04/10/2021   CL 108 (H) 04/10/2021   CO2 22 04/10/2021   Lab Results  Component  Value Date   ALT 21 04/10/2021   AST 14 04/10/2021   ALKPHOS 75 04/10/2021   BILITOT <0.2 04/10/2021   Lab Results  Component Value Date   HGBA1C 5.8 (H) 04/10/2021   HGBA1C 6.0 (H) 11/13/2020   HGBA1C 6.3 (H) 05/15/2020   Lab Results  Component Value Date   INSULIN 18.4 11/13/2020   INSULIN 34.7 (H) 05/15/2020   Lab Results  Component Value Date   TSH 1.730 05/15/2020   Lab Results  Component Value Date   CHOL 125 05/15/2020   HDL 36 (L) 05/15/2020   LDLCALC 71 05/15/2020   TRIG 95 05/15/2020   Lab Results  Component Value Date   VD25OH 51.4 04/10/2021   VD25OH 31.3 11/13/2020   VD25OH 21.2 (L) 05/15/2020   Lab Results  Component Value Date   WBC 7.4 04/10/2021   HGB 13.5 04/10/2021   HCT 43.1 04/10/2021   MCV 88 04/10/2021   PLT 305 04/10/2021   No results found for: IRON, TIBC, FERRITIN  Attestation Statements:   Reviewed by clinician on day of visit: allergies, medications, problem list, medical history, surgical history, family history, social history, and previous encounter notes.   Christina [name], am acting as transcriptionist for [employer], PA-C.  I have reviewed the above documentation for accuracy and completeness, and I agree with the above. Christina Potash, PA-C

## 2021-06-24 ENCOUNTER — Ambulatory Visit (INDEPENDENT_AMBULATORY_CARE_PROVIDER_SITE_OTHER): Payer: 59 | Admitting: Physician Assistant

## 2021-07-25 ENCOUNTER — Encounter (INDEPENDENT_AMBULATORY_CARE_PROVIDER_SITE_OTHER): Payer: Self-pay

## 2021-08-14 ENCOUNTER — Ambulatory Visit (INDEPENDENT_AMBULATORY_CARE_PROVIDER_SITE_OTHER): Payer: 59 | Admitting: Physician Assistant

## 2021-08-15 ENCOUNTER — Other Ambulatory Visit: Payer: Self-pay

## 2021-08-15 ENCOUNTER — Ambulatory Visit (INDEPENDENT_AMBULATORY_CARE_PROVIDER_SITE_OTHER): Payer: 59 | Admitting: Family Medicine

## 2021-08-15 ENCOUNTER — Encounter (INDEPENDENT_AMBULATORY_CARE_PROVIDER_SITE_OTHER): Payer: Self-pay | Admitting: Family Medicine

## 2021-08-15 VITALS — BP 125/79 | HR 72 | Temp 97.5°F | Ht 67.0 in | Wt 203.0 lb

## 2021-08-15 DIAGNOSIS — R7303 Prediabetes: Secondary | ICD-10-CM

## 2021-08-15 DIAGNOSIS — E786 Lipoprotein deficiency: Secondary | ICD-10-CM | POA: Diagnosis not present

## 2021-08-15 DIAGNOSIS — F39 Unspecified mood [affective] disorder: Secondary | ICD-10-CM | POA: Diagnosis not present

## 2021-08-15 DIAGNOSIS — Z6833 Body mass index (BMI) 33.0-33.9, adult: Secondary | ICD-10-CM

## 2021-08-15 DIAGNOSIS — Z9189 Other specified personal risk factors, not elsewhere classified: Secondary | ICD-10-CM | POA: Diagnosis not present

## 2021-08-15 DIAGNOSIS — E669 Obesity, unspecified: Secondary | ICD-10-CM

## 2021-08-15 DIAGNOSIS — E559 Vitamin D deficiency, unspecified: Secondary | ICD-10-CM | POA: Diagnosis not present

## 2021-08-15 MED ORDER — VITAMIN D (ERGOCALCIFEROL) 1.25 MG (50000 UNIT) PO CAPS: ORAL_CAPSULE | ORAL | 0 refills | Status: DC

## 2021-08-15 MED ORDER — SEMAGLUTIDE (1 MG/DOSE) 4 MG/3ML ~~LOC~~ SOPN: 1.0000 mg | PEN_INJECTOR | SUBCUTANEOUS | 0 refills | Status: DC

## 2021-08-15 MED ORDER — TOPIRAMATE 50 MG PO TABS: ORAL_TABLET | ORAL | 0 refills | Status: DC

## 2021-08-20 NOTE — Progress Notes (Signed)
Chief Complaint:   OBESITY Christina [name] is here to discuss her progress with her obesity treatment plan along with follow-up of her obesity related diagnoses. Christina [name] is on the Category 3 Plan and states she is following her eating plan approximately 0% of the time. Christina [name] states she is walking for 15 minutes 1-3 times per week.  Today's visit was #: 18 Starting weight: 218 lbs Starting date: 05/15/2020 Today's weight: 203 lbs Today's date: 08/15/2021 Total lbs lost to date: 15 Total lbs lost since last in-office visit: 0  Interim History: Kenzy lost her medical insurance at the end of September, and then she couldn't afford to pay for it December 1st. She is feeling overwhelmed and she did a lot of stress eating. She is surprised that she didn't gain more weight. She is eating candy, pastries, and she is eating out of the vending machine for lunch everyday (candy, snacks, etc.).  Subjective:   1. Low HDL (under 40) Christina [name]'s last HDL was below 40. She has been trying to improve her cholesterol levels with intensive lifestyle modification including a low saturated fat diet, exercise and weight loss. She denies any chest pain, claudication or myalgias.  2. Pre-diabetes Christina [name] was on metformin in the past, but she never felt that it worked well to decrease simple carbohydrates.  3. Vitamin D deficiency Christina [name] is currently taking prescription vitamin D 50,000 IU twice weekly. She denies nausea, vomiting or muscle weakness.  4. Mood disorder, with emotional eating Christina [name] is taking Elavil, Topamax, and Prozac. She could not tolerate Wellbutrin due to weight gain. She is unable to obtain counseling because she can't afford it, and there is no free EAP at work.  5. At risk for depression Christina [name] is at elevated risk of depression due to increased stressors in her life right now.  Assessment/Plan:   Orders Placed This Encounter  Procedures   Insulin, random   Hemoglobin A1c   TSH   T4,  free   VITAMIN D 25 Hydroxy (Vit-D Deficiency, Fractures)   Lipid Panel With LDL/HDL Ratio   Comprehensive metabolic panel    Medications Discontinued During This Encounter  Medication Reason   Semaglutide, 1 MG/DOSE, 4 MG/3ML SOPN Reorder   topiramate (TOPAMAX) 50 MG tablet Reorder   Vitamin D, Ergocalciferol, (DRISDOL) 1.25 MG (50000 UNIT) CAPS capsule Reorder     Meds ordered this encounter  Medications   Semaglutide, 1 MG/DOSE, 4 MG/3ML SOPN    Sig: Inject 1 mg as directed once a week.    Dispense:  9 mL    Refill:  0    ov for RF   topiramate (TOPAMAX) 50 MG tablet    Sig: 1 po twice daily    Dispense:  60 tablet    Refill:  0   Vitamin D, Ergocalciferol, (DRISDOL) 1.25 MG (50000 UNIT) CAPS capsule    Sig: 1 po q wed and 1 po q sun    Dispense:  8 capsule    Refill:  0     1. Low HDL (under 40) We discussed several lifestyle modifications today. We will recheck labs at her next office visit. Christina [name] will continue to work on diet, exercise and weight loss efforts. Orders and follow up as documented in patient record.   Counseling Intensive lifestyle modifications are the first line treatment for this issue. Dietary changes: Increase soluble fiber. Decrease simple carbohydrates. Exercise changes: Moderate to vigorous-intensity aerobic activity 150 minutes per week if tolerated. Lipid-lowering medications: see  documented in medical record.  - Lipid Panel With LDL/HDL Ratio - Comprehensive metabolic panel  2. Pre-diabetes We will refill Ozempic for 1 month. Christina [name] will continue to work on weight loss, exercise, and decreasing simple carbohydrates to help decrease the risk of diabetes. We will recheck labs at her next office visit.   - Semaglutide, 1 MG/DOSE, 4 MG/3ML SOPN; Inject 1 mg as directed once a week.  Dispense: 9 mL; Refill: 0 - Insulin, random - Hemoglobin A1c - TSH - T4, free  3. Vitamin D deficiency We will refill prescription Vitamin D for 1 month. We  will recheck labs at her next office visit. Christina [name] will follow-up for routine testing of Vitamin D, at least 2-3 times per year to avoid over-replacement.  - Vitamin D, Ergocalciferol, (DRISDOL) 1.25 MG (50000 UNIT) CAPS capsule; 1 po q wed and 1 po q sun  Dispense: 8 capsule; Refill: 0 - VITAMIN D 25 Hydroxy (Vit-D Deficiency, Fractures)  4. Mood disorder, with emotional eating I gave Christina [name] a list of names of counseling centers. We will refill Topamax for 1 month for emotional eating; intolerant to Wellbutrin. Orders and follow up as documented in patient record.   - topiramate (TOPAMAX) 50 MG tablet; 1 po twice daily  Dispense: 60 tablet; Refill: 0  5. At risk for depression Christina [name] was given approximately 10 minutes of depression risk counseling today. She has risk factors for depression including stress. We discussed the importance of a healthy work life balance, a healthy relationship with food and a good support system.  Repetitive spaced learning was employed today to elicit superior memory formation and behavioral change.  6. Obesity with current BMI of 31.8 Christina [name] is currently in the action stage of change. As such, her goal is to continue with weight loss efforts. She has agreed to the Category 3 Plan.   Christina [name] is to schedule an appointment with Dr. Lindi Adie. We will recheck fasting labs at her next office visit.  Exercise goals: Increase her exercise to 15-20 minutes 5 days per week.  Behavioral modification strategies: increasing lean protein intake, decreasing simple carbohydrates, and planning for success.  Christina [name] has agreed to follow-up with our clinic in 4 weeks. She was informed of the importance of frequent follow-up visits to maximize her success with intensive lifestyle modifications for her multiple health conditions.   Objective:   Blood pressure 125/79, pulse 72, temperature (!) 97.5 F (36.4 C), height 5\' 7"  (1.702 m), weight 203 lb (92.1 kg), SpO2 98 %. Body mass  index is 31.79 kg/m.  General: Cooperative, alert, well developed, in no acute distress. HEENT: Conjunctivae and lids unremarkable. Cardiovascular: Regular rhythm.  Lungs: Normal work of breathing. Neurologic: No focal deficits.   Lab Results  Component Value Date   CREATININE 0.73 04/10/2021   BUN 15 04/10/2021   NA 140 04/10/2021   K 4.9 04/10/2021   CL 108 (H) 04/10/2021   CO2 22 04/10/2021   Lab Results  Component Value Date   ALT 21 04/10/2021   AST 14 04/10/2021   ALKPHOS 75 04/10/2021   BILITOT <0.2 04/10/2021   Lab Results  Component Value Date   HGBA1C 5.8 (H) 04/10/2021   HGBA1C 6.0 (H) 11/13/2020   HGBA1C 6.3 (H) 05/15/2020   Lab Results  Component Value Date   INSULIN 18.4 11/13/2020   INSULIN 34.7 (H) 05/15/2020   Lab Results  Component Value Date   TSH 1.730 05/15/2020   Lab Results  Component Value Date  CHOL 125 05/15/2020   HDL 36 (L) 05/15/2020   LDLCALC 71 05/15/2020   TRIG 95 05/15/2020   Lab Results  Component Value Date   VD25OH 51.4 04/10/2021   VD25OH 31.3 11/13/2020   VD25OH 21.2 (L) 05/15/2020   Lab Results  Component Value Date   WBC 7.4 04/10/2021   HGB 13.5 04/10/2021   HCT 43.1 04/10/2021   MCV 88 04/10/2021   PLT 305 04/10/2021   No results found for: IRON, TIBC, FERRITIN  Attestation Statements:   Reviewed by clinician on day of visit: allergies, medications, problem list, medical history, surgical history, family history, social history, and previous encounter notes.   Wilhemena Durie, am acting as transcriptionist for [employer], DO.  I have reviewed the above documentation for accuracy and completeness, and I agree with the above. Marjory Sneddon, D.O.  The [place] was signed into law in 2016 which includes the topic of electronic health records.  This provides immediate access to information in MyChart.  This includes consultation notes, operative notes, office notes, lab results and  pathology reports.  If you have any questions about what you read please let us know at your next visit so we can discuss your concerns and take corrective action if need be.  We are right here with you.

## 2021-08-30 ENCOUNTER — Ambulatory Visit: Payer: 59 | Admitting: Plastic Surgery

## 2021-09-12 ENCOUNTER — Encounter (INDEPENDENT_AMBULATORY_CARE_PROVIDER_SITE_OTHER): Payer: Self-pay

## 2021-09-16 ENCOUNTER — Encounter (INDEPENDENT_AMBULATORY_CARE_PROVIDER_SITE_OTHER): Payer: Self-pay | Admitting: Family Medicine

## 2021-09-16 ENCOUNTER — Other Ambulatory Visit: Payer: Self-pay

## 2021-09-16 ENCOUNTER — Ambulatory Visit (INDEPENDENT_AMBULATORY_CARE_PROVIDER_SITE_OTHER): Payer: 59 | Admitting: Family Medicine

## 2021-09-16 VITALS — BP 129/86 | HR 77 | Temp 98.1°F | Ht 67.0 in | Wt 200.0 lb

## 2021-09-16 DIAGNOSIS — E559 Vitamin D deficiency, unspecified: Secondary | ICD-10-CM | POA: Diagnosis not present

## 2021-09-16 DIAGNOSIS — E538 Deficiency of other specified B group vitamins: Secondary | ICD-10-CM | POA: Diagnosis not present

## 2021-09-16 DIAGNOSIS — R7303 Prediabetes: Secondary | ICD-10-CM

## 2021-09-16 DIAGNOSIS — R946 Abnormal results of thyroid function studies: Secondary | ICD-10-CM

## 2021-09-16 DIAGNOSIS — Z6831 Body mass index (BMI) 31.0-31.9, adult: Secondary | ICD-10-CM

## 2021-09-16 DIAGNOSIS — F39 Unspecified mood [affective] disorder: Secondary | ICD-10-CM

## 2021-09-16 DIAGNOSIS — E669 Obesity, unspecified: Secondary | ICD-10-CM

## 2021-09-16 DIAGNOSIS — Z9189 Other specified personal risk factors, not elsewhere classified: Secondary | ICD-10-CM

## 2021-09-16 MED ORDER — TOPIRAMATE 50 MG PO TABS: ORAL_TABLET | ORAL | 0 refills | Status: DC

## 2021-09-16 MED ORDER — VITAMIN D (ERGOCALCIFEROL) 1.25 MG (50000 UNIT) PO CAPS: ORAL_CAPSULE | ORAL | 0 refills | Status: DC

## 2021-09-17 LAB — VITAMIN B12: Vitamin B-12: 250 pg/mL (ref 232–1245)

## 2021-09-17 LAB — T3: T3, Total: 146 ng/dL (ref 71–180)

## 2021-09-18 ENCOUNTER — Encounter (INDEPENDENT_AMBULATORY_CARE_PROVIDER_SITE_OTHER): Payer: Self-pay | Admitting: Family Medicine

## 2021-09-18 NOTE — Progress Notes (Signed)
Chief Complaint:   OBESITY Christina [name] is here to discuss her progress with her obesity treatment plan along with follow-up of her obesity related diagnoses. Ferol is on the Category 3 Plan and states she is following her eating plan approximately 80% of the time. Livia states she is walking for 15-20 minutes 3 times per week.  Today's visit was #: 72 Starting weight: 218 lbs Starting date: 05/15/2020 Today's weight: 200 lbs Today's date: 09/16/2021 Total lbs lost to date: 18 Total lbs lost since last in-office visit: 3  Interim History: Prisha states she tried her best to follow the meal plan, whenever she wasn't eating for holiday celebrations. She was last seen 1 month ago, and she has lost 3 lbs since then.  Great job.  She attributes her success of weight loss to portion controlling and making smarter choices over the past several weeks.  Subjective:   1. Thyroid function study abnormality Per the patient, she was seen by Dermatology for hair loss in December, and she was told that she had a "thyroid issue" causing her hair loss.  2. Pre-diabetes I gave Shaleigh a script for Ozempic in early December. She has taken only 2 doses thus far because the pharmacy was out of it for a while. Otherwise, she is tolerating Ozempic well, and notes that it is helping a lot with her appetite.  3. Vitamin B12 deficiency Carolan has been taking OTC supplementation, but not consistently. She notes feeling sluggish and tired.  4. Vitamin D deficiency TYIANA HILL is tolerating prescription Vitamin D well without side effects. Medication compliance is good and she takes it weekly most weeks.  Denies additional concerns regarding this condition.   5. Mood disorder, with emotional eating Makenli denies emotional eating. She notes Topamax is helping, but she feels "all over the place". She is still not seeing a counselor yet.  6. At risk for constipation Gianina is at increased risk for constipation  due to inadequate water intake of only 3 bottles per day, current diet, and side effects of Ozempic.   Assessment/Plan:   Orders Placed This Encounter  Procedures   T3   Vitamin B12  -->  These lab orders were added to existing ones already ordered last OV.  All to be drawn today.   Medications Discontinued During This Encounter  Medication Reason   topiramate (TOPAMAX) 50 MG tablet Reorder   Vitamin D, Ergocalciferol, (DRISDOL) 1.25 MG (50000 UNIT) CAPS capsule Reorder     Meds ordered this encounter  Medications   topiramate (TOPAMAX) 50 MG tablet    Sig: 1 po twice daily    Dispense:  60 tablet    Refill:  0   Vitamin D, Ergocalciferol, (DRISDOL) 1.25 MG (50000 UNIT) CAPS capsule    Sig: 1 po q wed and 1 po q sun    Dispense:  8 capsule    Refill:  0     1. Thyroid function study abnormality We will check labs today, extended thyroid panel was obtained. We will discuss results at Tyesha's next office visit.  - T3  2. Pre-diabetes Meghna will continue continue Ozempic 1 mg weekly, no need for a refill. She will continue with weight loss, exercise, and decreasing simple carbohydrates to help decrease the risk of diabetes.   3. Vitamin B12 deficiency The diagnosis was reviewed with the patient. We will check labs today. Counseling provided today, see below. We will continue to monitor. Orders and follow up as  documented in patient record.  Counseling The body needs vitamin B12: to make red blood cells; to make DNA; and to help the nerves work properly so they can carry messages from the brain to the body.  The main causes of vitamin B12 deficiency include dietary deficiency, digestive diseases, pernicious anemia, and having a surgery in which part of the stomach or small intestine is removed.  Certain medicines can make it harder for the body to absorb vitamin B12. These medicines include: heartburn medications; some antibiotics; some medications used to treat diabetes,  gout, and high cholesterol.  In some cases, there are no symptoms of this condition. If the condition leads to anemia or nerve damage, various symptoms can occur, such as weakness or fatigue, shortness of breath, and numbness or tingling in your hands and feet.   Treatment:  May include taking vitamin B12 supplements.  Avoid alcohol.  Eat lots of healthy foods that contain vitamin B12: Beef, pork, chicken, [place], and organ meats, such as liver.  Seafood: This includes clams, rainbow trout, salmon, tuna, and haddock. Eggs.  Cereal and dairy products that are fortified: This means that vitamin B12 has been added to the food.   - Vitamin B12  4. Vitamin D deficiency We will refill prescription Vitamin D for 1 month.  Julius will follow-up for routine testing of Vitamin D, at least 2-3 times per year to avoid over-replacement.  - Vitamin D, Ergocalciferol, (DRISDOL) 1.25 MG (50000 UNIT) CAPS capsule; 1 po q wed and 1 po q sun  Dispense: 8 capsule; Refill: 0  5. Mood disorder, with emotional eating Behavior modification techniques were discussed today. Aryonna will continue Topamax, and we will refill for 1 month. Orders and follow up as documented in patient record.   - topiramate (TOPAMAX) 50 MG tablet; 1 po twice daily  Dispense: 60 tablet; Refill: 0  6. At risk for constipation Bayla was given approximately 9 minutes of counseling today regarding prevention of constipation. She was encouraged to increase water and fiber intake.   7. Obesity with current BMI of 31.4 Katara is currently in the action stage of change. As such, her goal is to continue with weight loss efforts. She has agreed to the Category 3 Plan.   Exercise goals: Try to increase to 30' 5d/wk.   For substantial health benefits, adults should do at least 150 minutes (2 hours and 30 minutes) a week of moderate-intensity, or 75 minutes (1 hour and 15 minutes) a week of vigorous-intensity aerobic physical activity, or an  equivalent combination of moderate- and vigorous-intensity aerobic activity. Aerobic activity should be performed in episodes of at least 10 minutes, and preferably, it should be spread throughout the week.  Behavioral modification strategies: increasing lean protein intake, decreasing simple carbohydrates, and planning for success.  Aleigh has agreed to follow-up with our clinic in 2 weeks. She was informed of the importance of frequent follow-up visits to maximize her success with intensive lifestyle modifications for her multiple health conditions.   Jandy was informed we will discuss her lab results at her next visit unless there is a critical issue that needs to be addressed sooner. Tu agreed to keep her next visit at the agreed upon time to discuss these results.  Objective:   Blood pressure 129/86, pulse 77, temperature 98.1 F (36.7 C), height 5\' 7"  (1.702 m), weight 200 lb (90.7 kg), SpO2 97 %. Body mass index is 31.32 kg/m.  General: Cooperative, alert, well developed, in no acute distress.  HEENT: Conjunctivae and lids unremarkable. Cardiovascular: Regular rhythm.  Lungs: Normal work of breathing. Neurologic: No focal deficits.   Lab Results  Component Value Date   CREATININE 0.73 04/10/2021   BUN 15 04/10/2021   NA 140 04/10/2021   K 4.9 04/10/2021   CL 108 (H) 04/10/2021   CO2 22 04/10/2021   Lab Results  Component Value Date   ALT 21 04/10/2021   AST 14 04/10/2021   ALKPHOS 75 04/10/2021   BILITOT <0.2 04/10/2021   Lab Results  Component Value Date   HGBA1C 5.8 (H) 04/10/2021   HGBA1C 6.0 (H) 11/13/2020   HGBA1C 6.3 (H) 05/15/2020   Lab Results  Component Value Date   INSULIN 18.4 11/13/2020   INSULIN 34.7 (H) 05/15/2020   Lab Results  Component Value Date   TSH 1.730 05/15/2020   Lab Results  Component Value Date   CHOL 125 05/15/2020   HDL 36 (L) 05/15/2020   LDLCALC 71 05/15/2020   TRIG 95 05/15/2020   Lab Results  Component Value Date    VD25OH 51.4 04/10/2021   VD25OH 31.3 11/13/2020   VD25OH 21.2 (L) 05/15/2020   Lab Results  Component Value Date   WBC 7.4 04/10/2021   HGB 13.5 04/10/2021   HCT 43.1 04/10/2021   MCV 88 04/10/2021   PLT 305 04/10/2021   No results found for: IRON, TIBC, FERRITIN  Attestation Statements:   Reviewed by clinician on day of visit: allergies, medications, problem list, medical history, surgical history, family history, social history, and previous encounter notes.   Wilhemena Durie, am acting as transcriptionist for [employer], DO.  I have reviewed the above documentation for accuracy and completeness, and I agree with the above. Marjory Sneddon, D.O.  The [place] was signed into law in 2016 which includes the topic of electronic health records.  This provides immediate access to information in MyChart.  This includes consultation notes, operative notes, office notes, lab results and pathology reports.  If you have any questions about what you read please let us know at your next visit so we can discuss your concerns and take corrective action if need be.  We are right here with you.

## 2021-09-20 ENCOUNTER — Ambulatory Visit (INDEPENDENT_AMBULATORY_CARE_PROVIDER_SITE_OTHER): Payer: 59

## 2021-09-20 ENCOUNTER — Ambulatory Visit (INDEPENDENT_AMBULATORY_CARE_PROVIDER_SITE_OTHER): Payer: 59 | Admitting: Orthopaedic Surgery

## 2021-09-20 ENCOUNTER — Encounter: Payer: Self-pay | Admitting: Orthopaedic Surgery

## 2021-09-20 ENCOUNTER — Other Ambulatory Visit: Payer: Self-pay

## 2021-09-20 DIAGNOSIS — G8929 Other chronic pain: Secondary | ICD-10-CM | POA: Diagnosis not present

## 2021-09-20 DIAGNOSIS — M25561 Pain in right knee: Secondary | ICD-10-CM

## 2021-09-20 LAB — COMPREHENSIVE METABOLIC PANEL
ALT: 26 IU/L (ref 0–32)
AST: 15 IU/L (ref 0–40)
Albumin/Globulin Ratio: 1.8 (ref 1.2–2.2)
Albumin: 3.9 g/dL (ref 3.8–4.9)
Alkaline Phosphatase: 61 IU/L (ref 44–121)
BUN/Creatinine Ratio: 17 (ref 9–23)
BUN: 15 mg/dL (ref 6–24)
Bilirubin Total: <0.2 mg/dL (ref 0.0–1.2)
CO2: 22 mmol/L (ref 20–29)
Calcium: 9.8 mg/dL (ref 8.7–10.2)
Chloride: 107 mmol/L — ABNORMAL HIGH (ref 96–106)
Creatinine, Ser: 0.87 mg/dL (ref 0.57–1.00)
Globulin, Total: 2.2 g/dL (ref 1.5–4.5)
Glucose: 85 mg/dL (ref 70–99)
Potassium: 4.2 mmol/L (ref 3.5–5.2)
Sodium: 141 mmol/L (ref 134–144)
Total Protein: 6.1 g/dL (ref 6.0–8.5)
eGFR: 80 mL/min/{1.73_m2} (ref >59)

## 2021-09-20 LAB — LIPID PANEL WITH LDL/HDL RATIO
Cholesterol, Total: 120 mg/dL (ref 100–199)
HDL: 43 mg/dL (ref >39)
LDL Chol Calc (NIH): 62 mg/dL (ref 0–99)
LDL/HDL Ratio: 1.4 ratio (ref 0.0–3.2)
Triglycerides: 73 mg/dL (ref 0–149)
VLDL Cholesterol Cal: 15 mg/dL (ref 5–40)

## 2021-09-20 LAB — HEMOGLOBIN A1C
Est. average glucose Bld gHb Est-mCnc: 128 mg/dL
Hgb A1c MFr Bld: 6.1 % — ABNORMAL HIGH (ref 4.8–5.6)

## 2021-09-20 LAB — T4, FREE: Free T4: 1.45 ng/dL (ref 0.82–1.77)

## 2021-09-20 LAB — INSULIN, RANDOM: INSULIN: 25.2 u[IU]/mL — ABNORMAL HIGH (ref 2.6–24.9)

## 2021-09-20 LAB — VITAMIN D 25 HYDROXY (VIT D DEFICIENCY, FRACTURES): Vit D, 25-Hydroxy: 29.5 ng/mL — ABNORMAL LOW (ref 30.0–100.0)

## 2021-09-20 LAB — TSH: TSH: 4.13 u[IU]/mL (ref 0.450–4.500)

## 2021-09-20 NOTE — Progress Notes (Signed)
Office Visit Note   Patient: Christina [name]           Date of Birth: 1965/10/20           MRN: [id number] Visit Date: 09/20/2021              Requested by: Maurice Small, MD [address],  [place] [id number] PCP: Maurice Small, MD   Assessment & Plan: Visit Diagnoses:  1. Chronic pain of right knee     Plan: Impression is traumatic right medial meniscus tear from recent fall.  Due to lack of improvement from conservative management I recommend obtaining an MRI.  Patient will follow-up after the MRI.  We will hold off on aspiration injection until after the MRI.  Follow-Up Instructions: No follow-ups on file.   Orders:  Orders Placed This Encounter  Procedures   XR KNEE 3 VIEW RIGHT   MR Knee Right w/o contrast   No orders of the defined types were placed in this encounter.     Procedures: No procedures performed   Clinical Data: No additional findings.   Subjective: Chief Complaint  Patient presents with   Right Knee - Pain    HPI  Christina [name] is a very pleasant 56 year old female here for ongoing right knee pain for about a year and a half with recent with development of swelling in the last 2 weeks.  She underwent partial medial meniscectomy by Dr. Mardelle Matte about 21 months ago.  She had a good recovery from that surgery.  Intraoperatively there were grade 2 and 3 changes noted.  She states that her symptoms started 2 months ago when she fell and forcefully flexed her knee behind her leg.  She has had exquisite medial sided knee pain since that time.  She did undergo cortisone injection and took oral prednisone about a month ago in December but unfortunately this did not improve her symptoms.  Review of Systems   Objective: Vital Signs: LMP  (LMP Unknown)   Physical Exam  Ortho Exam  Right knee shows a moderate joint effusion.  Medial joint line tenderness.  Pain with medial meniscal testing.  Range of motion is slightly limited.   Collaterals and cruciates are stable.  Specialty Comments:  No specialty comments available.  Imaging: XR KNEE 3 VIEW RIGHT  Result Date: 09/20/2021 Mild osteoarthritis and periarticular spurring.    PMFS History: Patient Active Problem List   Diagnosis Date Noted   At risk for dehydration 01/28/2021   At risk for malnutrition 01/01/2021   Obesity, Class I, BMI 30-34.9 12/31/2020   Vitamin B12 deficiency 10/08/2020   At risk for depression 10/08/2020   Mood disorder ([place]), with emotional eating 09/24/2020   At risk for impaired metabolic function 27/78/2423   At risk for diabetes mellitus 07/30/2020   Vitamin D deficiency 07/02/2020   Depression 07/02/2020   At risk for side effect of medication 07/02/2020   Stress due to illness of family member-  daughter with drug addiction 05/15/2020   Depression, recurrent ([place]) 05/15/2020   Anemia 05/15/2020   Constipation 05/15/2020   Prediabetes 05/15/2020   Breast asymmetry following reconstructive surgery 04/24/2020   Acquired absence of breast 04/24/2020   S/P breast reconstruction, bilateral 04/24/2020   Acute medial meniscus tear of left knee 12/20/2019   Breast cancer, left ([place]) 01/09/2016   Genetic testing 53/61/4431   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   Family history of breast cancer in female 11/08/2015  Family history of colon cancer 11/08/2015   Malignant neoplasm of central portion of left breast in female, estrogen receptor positive ([place]) 10/31/2015   Endometriosis 03/11/2011   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,  multifocal IDC, DCIS, ER/PR+,  01-09-2016 s/p bilteral mastectomies w/ left sln bx;  no chemoradiation   Cataracts, bilateral    Depression    GAD (generalized anxiety disorder)    Gallbladder problem    History of ovarian cyst    IBS (irritable bowel  syndrome)    Joint pain    PONV (postoperative nausea and vomiting)    does well with scop patch   Pre-diabetes    followed by pcp; on metformin for weight loss -Dr Mellody Dance   Retinal detachment    Rheg OS   Right knee meniscal tear    Urgency of urination     Family History  Problem Relation Age of Onset   Heart failure Father    Prostate cancer Father 17   Retinal detachment Father    Colon polyps Mother        approx 2   Other Mother        hx HPV and hysterectomy due to precancerous cells   Depression Mother    Anxiety disorder Mother    Other Sister 46       hx of hysterectomy for unspecified reason; still has ovaries   Other Sister 27       paternal half-sister hx of hysterectomy for unspecified reason; still has ovaries   Bladder Cancer Maternal Uncle 79       not a smoker   Kidney failure Maternal Grandmother    Congestive Heart Failure Maternal Grandmother    Colon cancer Maternal Grandmother 70   Diabetes Maternal Grandmother    Lung cancer Maternal Grandfather 14       smoker   Breast cancer Paternal Grandmother        dx. early 78s; w/ hx of trauma to breast   Crohn's disease Daughter    Lung cancer Maternal Uncle 37       smoker    Past Surgical History:  Procedure Laterality Date   ABDOMINAL HYSTERECTOMY  05/1996   endometriosis   ACHILLES TENDON REPAIR Right 2010;  revision 2011   BLADDER SUSPENSION  2000   BREAST BIOPSY Left 10/2015   BREAST IMPLANT REMOVAL Bilateral 08/12/2017   Procedure: REMOVAL BILATERAL BREAST IMPLANTS;  Surgeon: Wallace Going, DO;  Location: [facility];  Service: Plastics;  Laterality: Bilateral;   BREAST RECONSTRUCTION WITH PLACEMENT OF TISSUE EXPANDER AND FLEX HD (ACELLULAR HYDRATED DERMIS) Bilateral 01/09/2016   BREAST RECONSTRUCTION WITH PLACEMENT OF TISSUE EXPANDER AND FLEX HD (ACELLULAR HYDRATED DERMIS) Bilateral 01/09/2016   Procedure: BREAST RECONSTRUCTION WITH PLACEMENT OF TISSUE EXPANDER AND FLEX  HD (ACELLULAR HYDRATED DERMIS);  Surgeon: Loel Lofty Dillingham, DO;  Location: [facility];  Service: Plastics;  Laterality: Bilateral;   BREAST RECONSTRUCTION WITH PLACEMENT OF TISSUE EXPANDER AND FLEX HD (ACELLULAR HYDRATED DERMIS) Bilateral 05/29/2016   Procedure: PLACEMENT OF BILATERAL TISSUE EXPANDER AND FLEX HD (ACELLULAR HYDRATED DERMIS);  Surgeon: Wallace Going, DO;  Location: [facility];  Service: Plastics;  Laterality: Bilateral;   BREAST REDUCTION SURGERY Bilateral 11/26/2016   Procedure: BILATERAL BREAST CAPSULE CONTRACTURE RELASE;  Surgeon: Lyndee Leo  S Dillingham, DO;  Location: [facility];  Service: Plastics;  Laterality: Bilateral;   [place]; 1989; [place] Right x3   [facility] one [facility]01-2019 [facility]   EYE SURGERY Left 10/01/2020   Pneumatic retinopexy for repair of rheg RD - Dr. Bernarda Caffey   EYE SURGERY Left 10/04/2020   PPV - Dr. Bernarda Caffey   FAT GRAFTING BILATERAL BREAST  08-09-2018  _0    GAS INSERTION Left 10/04/2020   Procedure: INSERTION OF GAS;  Surgeon: Bernarda Caffey, MD;  Location: [facility];  Service: Ophthalmology;  Laterality: Left;   GAS/FLUID EXCHANGE Left 10/04/2020   Procedure: GAS/FLUID EXCHANGE;  Surgeon: Bernarda Caffey, MD;  Location: [facility];  Service: Ophthalmology;  Laterality: Left;   INCISION AND DRAINAGE OF WOUND Bilateral 02/11/2016   Procedure: IRRIGATION AND DEBRIDEMENT OF BILATERAL BREAST POCKET;  Surgeon: Wallace Going, DO;  Location: [facility];  Service: Plastics;  Laterality: Bilateral;   KNEE ARTHROSCOPY WITH MEDIAL MENISECTOMY Right 12/20/2019   Procedure: KNEE ARTHROSCOPY WITH MEDIAL MENISECTOMY;  Surgeon: Marchia Bond, MD;  Location: [facility];  Service: Orthopedics;  Laterality: Right;   LAPAROSCOPIC APPENDECTOMY  04-07-2011   _1    w/ Excision peritoneal lipoma and lysis adhesions   LAPAROSCOPIC CHOLECYSTECTOMY  ~ [place] Right 10/04/2020    Procedure: LASER RETINOPEXY WITH INDIRECT LASER OPTHALMOSCOPE, RIGHT EYE;  Surgeon: Bernarda Caffey, MD;  Location: [facility];  Service: Ophthalmology;  Laterality: Right;   LIPOSUCTION WITH LIPOFILLING Bilateral 11/26/2016   Procedure: LIPOFILLING FOR SYMMETRY;  Surgeon: Wallace Going, DO;  Location: [facility];  Service: Plastics;  Laterality: Bilateral;   LIPOSUCTION WITH LIPOFILLING Bilateral 01/21/2017   Procedure: BILATERAL BREAST  LIPOFILLING FOR ASYMMETRY;  Surgeon: Wallace Going, DO;  Location: [facility];  Service: Plastics;  Laterality: Bilateral;   LIPOSUCTION WITH LIPOFILLING Bilateral 06/28/2020   Procedure: Lipofilling bilateral breasts for asymmetry;  Surgeon: Wallace Going, DO;  Location: [name];  Service: Plastics;  Laterality: Bilateral;  90 min   MASTECTOMY Bilateral 01/09/2016   NIPPLE SPARING MASTECTOMY/SENTINAL LYMPH NODE BIOPSY/RECONSTRUCTION/PLACEMENT OF TISSUE EXPANDER Bilateral 01/09/2016   Procedure: BILATERAL NIPPLE SPARING MASTECTOMY WITH LEFT SENTINAL LYMPH NODE BIOPSY ;  Surgeon: Stark Klein, MD;  Location: [facility];  Service: General;  Laterality: Bilateral;   PHOTOCOAGULATION WITH LASER Left 10/04/2020   Procedure: PHOTOCOAGULATION WITH LASER;  Surgeon: Bernarda Caffey, MD;  Location: [facility];  Service: Ophthalmology;  Laterality: Left;   REMOVAL OF BILATERAL TISSUE EXPANDERS WITH PLACEMENT OF BILATERAL BREAST IMPLANTS Bilateral 08/20/2016   Procedure: REMOVAL OF BILATERAL TISSUE EXPANDERS WITH PLACEMENT OF BILATERAL SILICONE IMPLANTS;  Surgeon: Wallace Going, DO;  Location: [facility];  Service: Plastics;  Laterality: Bilateral;   REMOVAL OF BILATERAL TISSUE EXPANDERS WITH PLACEMENT OF BILATERAL BREAST IMPLANTS Bilateral 11/05/2017   Procedure: REMOVAL OF BILATERAL TISSUE EXPANDERS WITH PLACEMENT OF BILATERAL BREAST SILICONE IMPLANTS;  Surgeon: Wallace Going, DO;  Location: [facility];  Service: Plastics;  Laterality: Bilateral;   REMOVAL OF TISSUE EXPANDER Bilateral 02/11/2016   Procedure: REMOVAL OF BILATERAL TISSUE EXPANDERS AND FLEX HD REMOVAL;  Surgeon: Wallace Going, DO;  Location: [facility];  Service: Plastics;  Laterality: Bilateral;   RETINAL DETACHMENT SURGERY Left 10/01/2020   Pneumatic retinopexy for repair of rheg RD - Dr. Bernarda Caffey   RETINAL DETACHMENT SURGERY Left 10/04/2020  PPV - Dr. Bernarda Caffey   SCLERAL BUCKLE Left 10/04/2020   Procedure: SCLERAL BUCKLE LEFT EYE;  Surgeon: Bernarda Caffey, MD;  Location: [facility];  Service: Ophthalmology;  Laterality: Left;   TISSUE EXPANDER PLACEMENT Bilateral 08/12/2017   Procedure: PLACEMENT OF BILATERAL TISSUE EXPANDER;  Surgeon: Wallace Going, DO;  Location: [facility];  Service: Plastics;  Laterality: Bilateral;   TUBAL LIGATION Bilateral 1991   VITRECTOMY 25 GAUGE WITH SCLERAL BUCKLE Left 10/04/2020   Procedure: 25 GAUGE PARS PLANA VITRECTOMY LEFT EYE ;  Surgeon: Bernarda Caffey, MD;  Location: [facility];  Service: Ophthalmology;  Laterality: Left;   Social History   Occupational History   Occupation: Holiday representative: Education administrator  Tobacco Use   Smoking status: Former    Packs/day: 1.00    Years: 10.00    Pack years: 10.00    Types: Cigarettes    Quit date: 06/08/2008    Years since quitting: 13.2   Smokeless tobacco: Never  Vaping Use   Vaping Use: Never used  Substance and Sexual Activity   Alcohol use: No   Drug use: Never   Sexual activity: Never    Birth control/protection: Surgical    Comment: hysterectomy

## 2021-09-30 ENCOUNTER — Ambulatory Visit (HOSPITAL_COMMUNITY)
Admission: RE | Admit: 2021-09-30 | Discharge: 2021-09-30 | Disposition: A | Discharge status: Home / Self Care | Payer: 59 | Source: Ambulatory Visit | Attending: Orthopaedic Surgery | Admitting: Orthopaedic Surgery

## 2021-09-30 ENCOUNTER — Other Ambulatory Visit: Payer: Self-pay

## 2021-09-30 DIAGNOSIS — M25561 Pain in right knee: Secondary | ICD-10-CM | POA: Diagnosis present

## 2021-09-30 DIAGNOSIS — G8929 Other chronic pain: Secondary | ICD-10-CM | POA: Diagnosis present

## 2021-10-01 ENCOUNTER — Ambulatory Visit (INDEPENDENT_AMBULATORY_CARE_PROVIDER_SITE_OTHER): Payer: 59 | Admitting: Plastic Surgery

## 2021-10-01 ENCOUNTER — Encounter: Payer: Self-pay | Admitting: Orthopaedic Surgery

## 2021-10-01 ENCOUNTER — Encounter: Payer: Self-pay | Admitting: Plastic Surgery

## 2021-10-01 ENCOUNTER — Ambulatory Visit (INDEPENDENT_AMBULATORY_CARE_PROVIDER_SITE_OTHER): Payer: 59 | Admitting: Orthopaedic Surgery

## 2021-10-01 DIAGNOSIS — C50112 Malignant neoplasm of central portion of left female breast: Secondary | ICD-10-CM

## 2021-10-01 DIAGNOSIS — N651 Disproportion of reconstructed breast: Secondary | ICD-10-CM | POA: Diagnosis not present

## 2021-10-01 DIAGNOSIS — Z9013 Acquired absence of bilateral breasts and nipples: Secondary | ICD-10-CM

## 2021-10-01 DIAGNOSIS — S83241A Other tear of medial meniscus, current injury, right knee, initial encounter: Secondary | ICD-10-CM | POA: Diagnosis not present

## 2021-10-01 DIAGNOSIS — Z170 Estrogen receptor positive status [ER+]: Secondary | ICD-10-CM

## 2021-10-01 NOTE — Progress Notes (Signed)
Office Visit Note   Patient: Christina [name]           Date of Birth: [birth year]           MRN: [id number] Visit Date: 10/01/2021              Requested by: Maurice Small, MD [place],  [place] [address] PCP: Maurice Small, MD   Assessment & Plan: Visit Diagnoses:  1. Acute medial meniscus tear, right, initial encounter     Plan: Christina [name] returns today to discuss right knee MRI results.  Examination of the right knee is unchanged.  She has medial joint line tenderness.  Pain with McMurray testing of the medial joint line.  The MRI shows complex tear of the medial meniscus with extrusion.  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  She reports that the pain is different than her baseline arthritic pain which responded to cortisone injections.  She has constant mechanical symptoms.  The effusion is very symptomatic to her.  Based on these findings I have recommended arthroscopic partial medial meniscectomy and chondroplasty as indicated.  She understands that in the future she may require a knee replacement as the DJD gets worse but at this time I do not feel that a knee replacement is indicated.  Risk benefits prognosis reviewed with the patient in regards to the arthroscopic surgery.  Questions encouraged and answered.  Christina [name] will meet with her today.  Follow-Up Instructions: No follow-ups on file.   Orders:  No orders of the defined types were placed in this encounter.  No orders of the defined types were placed in this encounter.     Procedures: No procedures performed   Clinical Data: No additional findings.   Subjective: Chief Complaint  Patient presents with   Right Knee - Pain    HPI  Review of Systems   Objective: Vital Signs: LMP  (LMP Unknown)   Physical Exam  Ortho Exam  Specialty Comments:  No specialty comments available.  Imaging: MR Knee Right w/o contrast  Result Date:  09/30/2021 CLINICAL DATA:  Knee trauma. Internal derangement suspected. Evaluate for medial meniscal tear. EXAM: MRI OF THE RIGHT KNEE WITHOUT CONTRAST TECHNIQUE: Multiplanar, multisequence MR imaging of the knee was performed. No intravenous contrast was administered. COMPARISON:  Right knee radiographs 09/20/2021 FINDINGS: MENISCI Medial meniscus: There is diffuse abnormal increased proton density signal seen throughout the posterior horn of the medial meniscus diffuse complex degenerative tears extending through the inferior greater than superior articular surfaces of the meniscal triangle. There is also absence of normal low signal meniscal tissue within the posterior horn closer to the root in the region measuring up to 10 mm in transverse dimension (coronal image 17, sagittal image 24). There is moderate extrusion of the body of the medial meniscus. There is truncation of the central third of the meniscal triangle of the mid AP dimension of the body of the medial meniscus. Lateral meniscus: Mild intermediate proton density signal intrasubstance degeneration within the root of the anterior horn of the lateral meniscus. No tear is seen extending through the articular surface of the lateral meniscus. LIGAMENTS Cruciates: The ACL and PCL are intact. Collaterals: Mild intermediate T2 signal proximal medial collateral ligament sprain. No fluid bright tear. The fibular collateral ligament, biceps femoris tendon, iliotibial band, and popliteus tendon are intact. CARTILAGE Patellofemoral: Full-thickness cartilage loss throughout the superior 50% of the patellar apex cartilage with moderate subchondral cystic change.  Multifocal full-thickness cartilage loss within the trochlear notch and medial trochlea. Medial: There is diffuse partial-thickness and multifocal full-thickness cartilage loss throughout the entire weight-bearing medial femoral condyle with moderate subchondral marrow edema. Mild peripheral medial  compartment degenerative osteophytes. There is also full-thickness cartilage loss within the far medial aspect of the medial tibial plateau with mild subchondral marrow edema. Lateral: There is multifocal full-thickness cartilage loss within the weight-bearing lateral femoral condyle. Joint: Moderate-to-largejoint effusion. Normal Hoffa's fat pad. No plical thickening. Popliteal Fossa:  No Baker's cyst. Extensor Mechanism:  Intact quadriceps tendon and patellar tendon. Bones:  No acute fracture or dislocation. Other: There is multiloculated cystic change bordering the posteromedial aspect of the distal femoral metadiaphysis, likely a ganglion with a thin "neck" extending from the nearby intercondylar notch (axial images 12 through 15). IMPRESSION: 1. Diffuse complex degenerative change and tearing throughout the posterior horn of the medial meniscus. Additional degenerative truncation of the central third of the body of the medial meniscus. 2. Mild proximal medial collateral ligament sprain. 3. Severe medial and patellofemoral compartment and moderate lateral compartment cartilage degenerative changes. 4. Moderate to large joint effusion. Electronically Signed   By: Yvonne Kendall M.D.   On: 09/30/2021 12:26     PMFS History: Patient Active Problem List   Diagnosis Date Noted   Acute medial meniscus tear, right, initial encounter 10/01/2021   At risk for dehydration 01/28/2021   At risk for malnutrition 01/01/2021   Obesity, Class I, BMI 30-34.9 12/31/2020   Vitamin B12 deficiency 10/08/2020   At risk for depression 10/08/2020   Mood disorder ([place]), with emotional eating 09/24/2020   At risk for impaired metabolic function 09/10/7251   At risk for diabetes mellitus 07/30/2020   Vitamin D deficiency 07/02/2020   Depression 07/02/2020   At risk for side effect of medication 07/02/2020   Stress due to illness of family member-  daughter with drug addiction 05/15/2020   Depression, recurrent ([place])  05/15/2020   Anemia 05/15/2020   Constipation 05/15/2020   Prediabetes 05/15/2020   Breast asymmetry following reconstructive surgery 04/24/2020   Acquired absence of breast 04/24/2020   S/P breast reconstruction, bilateral 04/24/2020   Acute medial meniscus tear of left knee 12/20/2019   Breast cancer, left ([place]) 01/09/2016   Genetic testing 66/44/0347   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   Family history of breast cancer in female 11/08/2015   Family history of colon cancer 11/08/2015   Malignant neoplasm of central portion of left breast in female, estrogen receptor positive ([place]) 10/31/2015   Endometriosis 03/11/2011   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,  multifocal IDC, DCIS, ER/PR+,  01-09-2016 s/p bilteral mastectomies w/ left sln bx;  no chemoradiation   Cataracts, bilateral    Depression    GAD (generalized anxiety disorder)    Gallbladder problem    History of ovarian cyst    IBS (irritable bowel syndrome)    Joint pain    PONV (postoperative nausea and vomiting)    does well with scop patch   Pre-diabetes    followed by pcp; on metformin for weight loss -Dr Mellody Dance   Retinal detachment    Rheg OS   Right knee meniscal tear    Urgency of urination     Family History  Problem Relation Age of  Onset   Heart failure Father    Prostate cancer Father 1   Retinal detachment Father    Colon polyps Mother        approx 2   Other Mother        hx HPV and hysterectomy due to precancerous cells   Depression Mother    Anxiety disorder Mother    Other Sister 46       hx of hysterectomy for unspecified reason; still has ovaries   Other Sister 59       paternal half-sister hx of hysterectomy for unspecified reason; still has ovaries   Bladder Cancer Maternal Uncle 79       not a smoker   Kidney failure Maternal  Grandmother    Congestive Heart Failure Maternal Grandmother    Colon cancer Maternal Grandmother 70   Diabetes Maternal Grandmother    Lung cancer Maternal Grandfather 42       smoker   Breast cancer Paternal Grandmother        dx. early 24s; w/ hx of trauma to breast   Crohn's disease Daughter    Lung cancer Maternal Uncle 37       smoker    Past Surgical History:  Procedure Laterality Date   ABDOMINAL HYSTERECTOMY  05/1996   endometriosis   ACHILLES TENDON REPAIR Right 2010;  revision 2011   BLADDER SUSPENSION  2000   BREAST BIOPSY Left 10/2015   BREAST IMPLANT REMOVAL Bilateral 08/12/2017   Procedure: REMOVAL BILATERAL BREAST IMPLANTS;  Surgeon: Wallace Going, DO;  Location: [facility];  Service: Plastics;  Laterality: Bilateral;   BREAST RECONSTRUCTION WITH PLACEMENT OF TISSUE EXPANDER AND FLEX HD (ACELLULAR HYDRATED DERMIS) Bilateral 01/09/2016   BREAST RECONSTRUCTION WITH PLACEMENT OF TISSUE EXPANDER AND FLEX HD (ACELLULAR HYDRATED DERMIS) Bilateral 01/09/2016   Procedure: BREAST RECONSTRUCTION WITH PLACEMENT OF TISSUE EXPANDER AND FLEX HD (ACELLULAR HYDRATED DERMIS);  Surgeon: Loel Lofty Dillingham, DO;  Location: [facility];  Service: Plastics;  Laterality: Bilateral;   BREAST RECONSTRUCTION WITH PLACEMENT OF TISSUE EXPANDER AND FLEX HD (ACELLULAR HYDRATED DERMIS) Bilateral 05/29/2016   Procedure: PLACEMENT OF BILATERAL TISSUE EXPANDER AND FLEX HD (ACELLULAR HYDRATED DERMIS);  Surgeon: Wallace Going, DO;  Location: [facility];  Service: Plastics;  Laterality: Bilateral;   BREAST REDUCTION SURGERY Bilateral 11/26/2016   Procedure: BILATERAL BREAST CAPSULE CONTRACTURE RELASE;  Surgeon: Wallace Going, DO;  Location: [facility];  Service: Plastics;  Laterality: Bilateral;   [place]; 1989; [place] Right x3   last one 02-01-2019 @ [facility]   EYE SURGERY Left 10/01/2020   Pneumatic retinopexy for repair of rheg  RD - Dr. Bernarda Caffey   EYE SURGERY Left 10/04/2020   PPV - Dr. Bernarda Caffey   FAT GRAFTING BILATERAL BREAST  08-09-2018  '@[facility]'    GAS INSERTION Left 10/04/2020   Procedure: INSERTION OF GAS;  Surgeon: Bernarda Caffey, MD;  Location: [facility];  Service: Ophthalmology;  Laterality: Left;   GAS/FLUID EXCHANGE Left 10/04/2020   Procedure: GAS/FLUID EXCHANGE;  Surgeon: Bernarda Caffey, MD;  Location: [facility];  Service: Ophthalmology;  Laterality: Left;   INCISION AND DRAINAGE OF WOUND Bilateral 02/11/2016   Procedure: IRRIGATION AND DEBRIDEMENT OF BILATERAL BREAST POCKET;  Surgeon: Wallace Going, DO;  Location: [place];  Service: Plastics;  Laterality: Bilateral;   KNEE ARTHROSCOPY WITH MEDIAL MENISECTOMY Right 12/20/2019   Procedure: KNEE ARTHROSCOPY WITH MEDIAL MENISECTOMY;  Surgeon:  Marchia Bond, MD;  Location[facility];  Service: Orthopedics;  Laterality: Right;   LAPAROSCOPIC APPENDECTOMY  04-07-2011   '@[facility]'    w/ Excision peritoneal lipoma and lysis adhesions   LAPAROSCOPIC CHOLECYSTECTOMY  ~ [place] Right 10/04/2020   Procedure: LASER RETINOPEXY WITH INDIRECT LASER OPTHALMOSCOPE, RIGHT EYE;  Surgeon: Bernarda Caffey, MD;  Location: [facility];  Service: Ophthalmology;  Laterality: Right;   LIPOSUCTION WITH LIPOFILLING Bilateral 11/26/2016   Procedure: LIPOFILLING FOR SYMMETRY;  Surgeon: Wallace Going, DO;  Location: [facility];  Service: Plastics;  Laterality: Bilateral;   LIPOSUCTION WITH LIPOFILLING Bilateral 01/21/2017   Procedure: BILATERAL BREAST  LIPOFILLING FOR ASYMMETRY;  Surgeon: Wallace Going, DO;  Location: [place];  Service: Plastics;  Laterality: Bilateral;   LIPOSUCTION WITH LIPOFILLING Bilateral 06/28/2020   Procedure: Lipofilling bilateral breasts for asymmetry;  Surgeon: Wallace Going, DO;  Location: [place];  Service: Plastics;  Laterality: Bilateral;  90 min    MASTECTOMY Bilateral 01/09/2016   NIPPLE SPARING MASTECTOMY/SENTINAL LYMPH NODE BIOPSY/RECONSTRUCTION/PLACEMENT OF TISSUE EXPANDER Bilateral 01/09/2016   Procedure: BILATERAL NIPPLE SPARING MASTECTOMY WITH LEFT SENTINAL LYMPH NODE BIOPSY ;  Surgeon: Stark Klein, MD;  Location: [facility];  Service: General;  Laterality: Bilateral;   PHOTOCOAGULATION WITH LASER Left 10/04/2020   Procedure: PHOTOCOAGULATION WITH LASER;  Surgeon: Bernarda Caffey, MD;  Location: [place];  Service: Ophthalmology;  Laterality: Left;   REMOVAL OF BILATERAL TISSUE EXPANDERS WITH PLACEMENT OF BILATERAL BREAST IMPLANTS Bilateral 08/20/2016   Procedure: REMOVAL OF BILATERAL TISSUE EXPANDERS WITH PLACEMENT OF BILATERAL SILICONE IMPLANTS;  Surgeon: Wallace Going, DO;  Location: [facility];  Service: Plastics;  Laterality: Bilateral;   REMOVAL OF BILATERAL TISSUE EXPANDERS WITH PLACEMENT OF BILATERAL BREAST IMPLANTS Bilateral 11/05/2017   Procedure: REMOVAL OF BILATERAL TISSUE EXPANDERS WITH PLACEMENT OF BILATERAL BREAST SILICONE IMPLANTS;  Surgeon: Wallace Going, DO;  Location: [facility];  Service: Plastics;  Laterality: Bilateral;   REMOVAL OF TISSUE EXPANDER Bilateral 02/11/2016   Procedure: REMOVAL OF BILATERAL TISSUE EXPANDERS AND FLEX HD REMOVAL;  Surgeon: Wallace Going, DO;  Location: [place];  Service: Plastics;  Laterality: Bilateral;   RETINAL DETACHMENT SURGERY Left 10/01/2020   Pneumatic retinopexy for repair of rheg RD - Dr. Bernarda Caffey   RETINAL DETACHMENT SURGERY Left 10/04/2020   PPV - Dr. Bernarda Caffey   SCLERAL BUCKLE Left 10/04/2020   Procedure: SCLERAL BUCKLE LEFT EYE;  Surgeon: Bernarda Caffey, MD;  Location: [place];  Service: Ophthalmology;  Laterality: Left;   TISSUE EXPANDER PLACEMENT Bilateral 08/12/2017   Procedure: PLACEMENT OF BILATERAL TISSUE EXPANDER;  Surgeon: Wallace Going, DO;  Location: [place];  Service: Plastics;   Laterality: Bilateral;   TUBAL LIGATION Bilateral 1991   VITRECTOMY 25 GAUGE WITH SCLERAL BUCKLE Left 10/04/2020   Procedure: 25 GAUGE PARS PLANA VITRECTOMY LEFT EYE ;  Surgeon: Bernarda Caffey, MD;  Location: [place];  Service: Ophthalmology;  Laterality: Left;   Social History   Occupational History   Occupation: Holiday representative: Education administrator  Tobacco Use   Smoking status: Former    Packs/day: 1.00    Years: 10.00    Pack years: 10.00    Types: Cigarettes    Quit date: 06/08/2008    Years since quitting: 13.3   Smokeless tobacco: Never  Vaping Use   Vaping Use: Never used  Substance and Sexual  Activity   Alcohol use: No   Drug use: Never   Sexual activity: Never    Birth control/protection: Surgical    Comment: hysterectomy

## 2021-10-01 NOTE — Progress Notes (Signed)
Patient ID: Christina [name], female    DOB: January 04, 1966, 56 y.o.   MRN: [id number]   Chief Complaint  Patient presents with   Follow-up   Breast Problem    The patient is a 56 year old female here for a yearly exam for her breast reconstruction.  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  Her preoperative bra size was a 36 C.  She is 5 feet 7 inches tall and her original preop weight was 186 pounds.  She had positive genetics.  She opted for bilateral mastectomies with immediate reconstruction in May 2017.  She had some issues with the expander and ended up having to get them removed in June.  By September she was able to have them placed again.  December 2017 we placed Mentor smooth round high profile gel 455 cc implants bilaterally.  In December 2018 we removed the implants and placed Mentor smooth expanders as the patient wanted to go larger.  She did very well with the expanders and in February 2019 we were able to place Mentor smooth round ultra high profile 590 cc gel implants.  She then had fat filling in October 2021.  Since then she has had some changes in her weight and is working with [facility].  She may want some more fat grafting but knows she will have a better result if she waits until she is at a more ideal weight.  I do not see or feel any concerning lumps or bumps and no capsular contracture.    Review of Systems  Constitutional: Negative.   Eyes: Negative.   Respiratory: Negative.    Cardiovascular: Negative.  Negative for leg swelling.  Gastrointestinal: Negative.   Endocrine: Negative.   Genitourinary: Negative.   Musculoskeletal: Negative.   Skin: Negative.    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,  multifocal IDC, DCIS, ER/PR+,  01-09-2016 s/p  bilteral mastectomies w/ left sln bx;  no chemoradiation   Cataracts, bilateral    Depression    GAD (generalized anxiety disorder)    Gallbladder problem    History of ovarian cyst    IBS (irritable bowel syndrome)    Joint pain    PONV (postoperative nausea and vomiting)    does well with scop patch   Pre-diabetes    followed by pcp; on metformin for weight loss -Dr Mellody Dance   Retinal detachment    Rheg OS   Right knee meniscal tear    Urgency of urination     Past Surgical History:  Procedure Laterality Date   ABDOMINAL HYSTERECTOMY  05/1996   endometriosis   ACHILLES TENDON REPAIR Right 2010;  revision 2011   BLADDER SUSPENSION  2000   BREAST BIOPSY Left 10/2015   BREAST IMPLANT REMOVAL Bilateral 08/12/2017   Procedure: REMOVAL BILATERAL BREAST IMPLANTS;  Surgeon: Wallace Going, DO;  Location: [place];  Service: Plastics;  Laterality: Bilateral;   BREAST RECONSTRUCTION WITH PLACEMENT OF TISSUE EXPANDER AND FLEX HD (ACELLULAR HYDRATED DERMIS) Bilateral 01/09/2016   BREAST RECONSTRUCTION WITH PLACEMENT OF TISSUE EXPANDER AND FLEX HD (ACELLULAR HYDRATED DERMIS) Bilateral 01/09/2016   Procedure: BREAST RECONSTRUCTION WITH PLACEMENT OF TISSUE EXPANDER AND FLEX HD (ACELLULAR HYDRATED  DERMIS);  Surgeon: Loel Lofty Zarinah Oviatt, DO;  Location: [place];  Service: Plastics;  Laterality: Bilateral;   BREAST RECONSTRUCTION WITH PLACEMENT OF TISSUE EXPANDER AND FLEX HD (ACELLULAR HYDRATED DERMIS) Bilateral 05/29/2016   Procedure: PLACEMENT OF BILATERAL TISSUE EXPANDER AND FLEX HD (ACELLULAR HYDRATED DERMIS);  Surgeon: Wallace Going, DO;  Location: [place];  Service: Plastics;  Laterality: Bilateral;   BREAST REDUCTION SURGERY Bilateral 11/26/2016   Procedure: BILATERAL BREAST CAPSULE CONTRACTURE RELASE;  Surgeon: Wallace Going, DO;  Location: [place];  Service: Plastics;  Laterality: Bilateral;   [place]; 1989;  [place] Right x3   last one 02-01-2019 @ [facility]   EYE SURGERY Left 10/01/2020   Pneumatic retinopexy for repair of rheg RD - Dr. Bernarda Caffey   EYE SURGERY Left 10/04/2020   PPV - Dr. Bernarda Caffey   FAT GRAFTING BILATERAL BREAST  08-09-2018  @[facility]    GAS INSERTION Left 10/04/2020   Procedure: INSERTION OF GAS;  Surgeon: Bernarda Caffey, MD;  Location: [place];  Service: Ophthalmology;  Laterality: Left;   GAS/FLUID EXCHANGE Left 10/04/2020   Procedure: GAS/FLUID EXCHANGE;  Surgeon: Bernarda Caffey, MD;  Location: [place];  Service: Ophthalmology;  Laterality: Left;   INCISION AND DRAINAGE OF WOUND Bilateral 02/11/2016   Procedure: IRRIGATION AND DEBRIDEMENT OF BILATERAL BREAST POCKET;  Surgeon: Wallace Going, DO;  Location: [place];  Service: Plastics;  Laterality: Bilateral;   KNEE ARTHROSCOPY WITH MEDIAL MENISECTOMY Right 12/20/2019   Procedure: KNEE ARTHROSCOPY WITH MEDIAL MENISECTOMY;  Surgeon: Marchia Bond, MD;  Location: [place];  Service: Orthopedics;  Laterality: Right;   LAPAROSCOPIC APPENDECTOMY  04-07-2011   @[facility]    w/ Excision peritoneal lipoma and lysis adhesions   LAPAROSCOPIC CHOLECYSTECTOMY  ~ [place] Right 10/04/2020   Procedure: LASER RETINOPEXY WITH INDIRECT LASER OPTHALMOSCOPE, RIGHT EYE;  Surgeon: Bernarda Caffey, MD;  Location: [place];  Service: Ophthalmology;  Laterality: Right;   LIPOSUCTION WITH LIPOFILLING Bilateral 11/26/2016   Procedure: LIPOFILLING FOR SYMMETRY;  Surgeon: Wallace Going, DO;  Location: [place];  Service: Plastics;  Laterality: Bilateral;   LIPOSUCTION WITH LIPOFILLING Bilateral 01/21/2017   Procedure: BILATERAL BREAST  LIPOFILLING FOR ASYMMETRY;  Surgeon: Wallace Going, DO;  Location: [place];  Service: Plastics;  Laterality: Bilateral;   LIPOSUCTION WITH LIPOFILLING Bilateral 06/28/2020   Procedure: Lipofilling bilateral breasts for asymmetry;   Surgeon: Wallace Going, DO;  Location: [place];  Service: Plastics;  Laterality: Bilateral;  90 min   MASTECTOMY Bilateral 01/09/2016   NIPPLE SPARING MASTECTOMY/SENTINAL LYMPH NODE BIOPSY/RECONSTRUCTION/PLACEMENT OF TISSUE EXPANDER Bilateral 01/09/2016   Procedure: BILATERAL NIPPLE SPARING MASTECTOMY WITH LEFT SENTINAL LYMPH NODE BIOPSY ;  Surgeon: Stark Klein, MD;  Location: [place];  Service: General;  Laterality: Bilateral;   PHOTOCOAGULATION WITH LASER Left 10/04/2020   Procedure: PHOTOCOAGULATION WITH LASER;  Surgeon: Bernarda Caffey, MD;  Location: [place];  Service: Ophthalmology;  Laterality: Left;   REMOVAL OF BILATERAL TISSUE EXPANDERS WITH PLACEMENT OF BILATERAL BREAST IMPLANTS Bilateral 08/20/2016   Procedure: REMOVAL OF BILATERAL TISSUE EXPANDERS WITH PLACEMENT OF BILATERAL SILICONE IMPLANTS;  Surgeon: Wallace Going, DO;  Location: [place];  Service: Plastics;  Laterality: Bilateral;   REMOVAL OF BILATERAL TISSUE EXPANDERS WITH PLACEMENT OF BILATERAL BREAST IMPLANTS Bilateral 11/05/2017   Procedure: REMOVAL OF BILATERAL TISSUE EXPANDERS WITH PLACEMENT OF BILATERAL BREAST SILICONE IMPLANTS;  Surgeon: Wallace Going, DO;  Location: [place];  Service: Plastics;  Laterality: Bilateral;   REMOVAL OF TISSUE EXPANDER Bilateral 02/11/2016   Procedure: REMOVAL OF BILATERAL TISSUE EXPANDERS AND FLEX HD REMOVAL;  Surgeon: Wallace Going, DO;  Location: [place];  Service: Plastics;  Laterality: Bilateral;   RETINAL DETACHMENT SURGERY Left 10/01/2020   Pneumatic retinopexy for repair of rheg RD - Dr. Bernarda Caffey   RETINAL DETACHMENT SURGERY Left 10/04/2020   PPV - Dr. Bernarda Caffey   SCLERAL BUCKLE Left 10/04/2020   Procedure: SCLERAL BUCKLE LEFT EYE;  Surgeon: Bernarda Caffey, MD;  Location: [place];  Service: Ophthalmology;  Laterality: Left;   TISSUE EXPANDER PLACEMENT Bilateral 08/12/2017   Procedure: PLACEMENT OF  BILATERAL TISSUE EXPANDER;  Surgeon: Wallace Going, DO;  Location: [place];  Service: Plastics;  Laterality: Bilateral;   TUBAL LIGATION Bilateral 1991   VITRECTOMY 25 GAUGE WITH SCLERAL BUCKLE Left 10/04/2020   Procedure: 25 GAUGE PARS PLANA VITRECTOMY LEFT EYE ;  Surgeon: Bernarda Caffey, MD;  Location: [place];  Service: Ophthalmology;  Laterality: Left;      Current Outpatient Medications:    ALPRAZolam (XANAX) 0.25 MG tablet, Take 0.25 mg by mouth daily as needed for anxiety., Disp: , Rfl:    amitriptyline (ELAVIL) 100 MG tablet, Take 0.5 tablets (50 mg total) by mouth at bedtime., Disp: , Rfl:    FLUoxetine (PROZAC) 40 MG capsule, Take 40 mg by mouth daily., Disp: , Rfl:    Semaglutide, 1 MG/DOSE, 4 MG/3ML SOPN, Inject 1 mg as directed once a week., Disp: 9 mL, Rfl: 0   tamoxifen (NOLVADEX) 20 MG tablet, Take 1 tablet (20 mg total) by mouth daily., Disp: 90 tablet, Rfl: 3   topiramate (TOPAMAX) 50 MG tablet, 1 po twice daily, Disp: 60 tablet, Rfl: 0   vitamin B-12 (CYANOCOBALAMIN) 500 MCG tablet, Take 1 tablet (500 mcg total) by mouth daily., Disp: , Rfl:    Vitamin D, Ergocalciferol, (DRISDOL) 1.25 MG (50000 UNIT) CAPS capsule, 1 po q wed and 1 po q sun, Disp: 8 capsule, Rfl: 0   Objective:   There were no vitals filed for this visit.  Physical Exam Constitutional:      Appearance: Normal appearance.  Cardiovascular:     Rate and Rhythm: Normal rate.     Pulses: Normal pulses.  Pulmonary:     Effort: Pulmonary effort is normal. No respiratory distress.  Abdominal:     General: There is no distension.     Palpations: Abdomen is soft.     Tenderness: There is no abdominal tenderness.  Skin:    General: Skin is warm.     Capillary Refill: Capillary refill takes less than 2 seconds.     Coloration: Skin is not jaundiced.     Findings: No bruising.  Neurological:     Mental Status: She is alert and oriented to person, place, and time.  Psychiatric:         Mood and Affect: Mood normal.        Behavior: Behavior normal.        Thought Content: Thought content normal.    Assessment & Plan:  Malignant neoplasm of central portion of left breast in female, estrogen receptor positive (HCC)  Breast asymmetry following reconstructive surgery  Acquired absence of both breasts  We will plan a 1 year follow-up.  She is also eligible for an ultrasound at any point if she has  concerns or if she wants a baseline.  We did some imaging last year which was negative.  She knows to call with any questions or concerns.  Fat grafting can be planned when she is reached her desired weight.  It was great to see her and I am glad she is doing well.  Pictures were obtained of the patient and placed in the chart with the patient's or guardian's permission.   [place], DO

## 2021-10-02 ENCOUNTER — Encounter (INDEPENDENT_AMBULATORY_CARE_PROVIDER_SITE_OTHER): Payer: Self-pay | Admitting: Family Medicine

## 2021-10-02 ENCOUNTER — Ambulatory Visit (INDEPENDENT_AMBULATORY_CARE_PROVIDER_SITE_OTHER): Payer: 59 | Admitting: Family Medicine

## 2021-10-02 ENCOUNTER — Other Ambulatory Visit: Payer: Self-pay

## 2021-10-02 ENCOUNTER — Telehealth (INDEPENDENT_AMBULATORY_CARE_PROVIDER_SITE_OTHER): Payer: Self-pay

## 2021-10-02 VITALS — BP 124/79 | HR 72 | Temp 97.5°F | Ht 67.0 in | Wt 201.0 lb

## 2021-10-02 DIAGNOSIS — E786 Lipoprotein deficiency: Secondary | ICD-10-CM

## 2021-10-02 DIAGNOSIS — F39 Unspecified mood [affective] disorder: Secondary | ICD-10-CM

## 2021-10-02 DIAGNOSIS — Z9189 Other specified personal risk factors, not elsewhere classified: Secondary | ICD-10-CM

## 2021-10-02 DIAGNOSIS — Z6831 Body mass index (BMI) 31.0-31.9, adult: Secondary | ICD-10-CM

## 2021-10-02 DIAGNOSIS — R7303 Prediabetes: Secondary | ICD-10-CM

## 2021-10-02 DIAGNOSIS — L659 Nonscarring hair loss, unspecified: Secondary | ICD-10-CM

## 2021-10-02 DIAGNOSIS — Z6833 Body mass index (BMI) 33.0-33.9, adult: Secondary | ICD-10-CM

## 2021-10-02 DIAGNOSIS — E559 Vitamin D deficiency, unspecified: Secondary | ICD-10-CM

## 2021-10-02 DIAGNOSIS — E669 Obesity, unspecified: Secondary | ICD-10-CM

## 2021-10-02 MED ORDER — METFORMIN HCL 500 MG PO TABS: ORAL_TABLET | ORAL | 0 refills | Status: DC

## 2021-10-02 MED ORDER — TOPIRAMATE 50 MG PO TABS: ORAL_TABLET | ORAL | 0 refills | Status: DC

## 2021-10-02 MED ORDER — SEMAGLUTIDE (1 MG/DOSE) 4 MG/3ML ~~LOC~~ SOPN: 1.0000 mg | PEN_INJECTOR | SUBCUTANEOUS | 0 refills | Status: DC

## 2021-10-02 MED ORDER — VITAMIN D (ERGOCALCIFEROL) 1.25 MG (50000 UNIT) PO CAPS: ORAL_CAPSULE | ORAL | 0 refills | Status: DC

## 2021-10-02 NOTE — Telephone Encounter (Signed)
Prior Josem Kaufmann has been submitted to [employer]:  [place] and spoke with Cyril Mourning in customer service:  faxed records over to [phone]  PA # [phone]

## 2021-10-03 ENCOUNTER — Other Ambulatory Visit: Payer: Self-pay

## 2021-10-07 NOTE — Progress Notes (Signed)
Chief Complaint:   OBESITY Christina [name] is here to discuss her progress with her obesity treatment plan along with follow-up of her obesity related diagnoses. Christina [name] is on the Category 3 Plan and states she is following her eating plan approximately 50% of the time. Christina [name] states she is walking for 15 minutes 2 times per week.  Today's visit was #: 20 Starting weight: 218 lbs Starting date: 05/15/2020 Today's weight: 201 lbs Today's date: 10/02/2021 Total lbs lost to date: 17 Total lbs lost since last in-office visit: 0  Interim History: Christina [name] hasn't been getting in her protein or eating much on the plan lately. She is eating out 3-4 days per week. She hasn't contacted a counselor yet to help with her stress level. She has no issues with the meal plan. She is here to review labs.  Subjective:   1. Pre-diabetes Christina [name]'s levels are worsening. She notes increase in carbohydrate cravings. She is on Ozempic weekly as well. I discussed A1c and fasting insulin with the patient today.  2. Vitamin D deficiency Christina [name]'s Vit D level is not at goal, and has worsened over the past couple of months. She has been off of Ergocalciferol recently. I discussed Vit D level with the patient today.  3. Low HDL (under 40) Christina [name]'s HDL has normalized, and her triglycerides and LDL are within normal limits. I discussed FLP and CMP with the patient today.  4. Hair loss Christina [name] saw Dermatology who told her that her thyroid is abnormal. Labs were obtained here 1 week ago or so and are within normal limits. I discussed thyroid panel with the patient today..  5. Mood disorder, with emotional eating Christina [name] notes cravings and emotional eating. She hasn't had time for a counseling appointment yet.   6. At risk for diabetes mellitus Christina [name] is at higher than average risk for developing diabetes due to worsening pre-diabetes.   Assessment/Plan:  No orders of the defined types were placed in this  encounter.   Medications Discontinued During This Encounter  Medication Reason   Semaglutide, 1 MG/DOSE, 4 MG/3ML SOPN Reorder   topiramate (TOPAMAX) 50 MG tablet Reorder   Vitamin D, Ergocalciferol, (DRISDOL) 1.25 MG (50000 UNIT) CAPS capsule Reorder     Meds ordered this encounter  Medications   Semaglutide, 1 MG/DOSE, 4 MG/3ML SOPN    Sig: Inject 1 mg as directed once a week.    Dispense:  9 mL    Refill:  0    ov for RF   topiramate (TOPAMAX) 50 MG tablet    Sig: 1 po twice daily    Dispense:  60 tablet    Refill:  0   Vitamin D, Ergocalciferol, (DRISDOL) 1.25 MG (50000 UNIT) CAPS capsule    Sig: 1 po q wed and 1 po q sun    Dispense:  8 capsule    Refill:  0   metFORMIN (GLUCOPHAGE) 500 MG tablet    Sig: 1/2 tab po q lunch daily * 6d, then 1 po qd    Dispense:  30 tablet    Refill:  0     1. Pre-diabetes [name] will continue Ozempic as is and we will refill for 1 month; and she agreed to start metformin 250 mg with lunch for 6 days then increase to 500 mg daily with no refills. She will continue to work on weight loss, exercise, and decreasing simple carbohydrates to help decrease the risk of diabetes.   - Semaglutide, 1 MG/DOSE, 4  MG/3ML SOPN; Inject 1 mg as directed once a week.  Dispense: 9 mL; Refill: 0 - metFORMIN (GLUCOPHAGE) 500 MG tablet; 1/2 tab po q lunch daily * 6d, then 1 po qd  Dispense: 30 tablet; Refill: 0  2. Vitamin D deficiency [name] will continue prescription Vitamin D 50,000 IU every week, and we will refill for 1 month. This dose in the past has cause nausea but has also put her Vit D level at goal. She will follow-up for routine testing of Vitamin D, at least 2-3 times per year to avoid over-replacement.  - Vitamin D, Ergocalciferol, (DRISDOL) 1.25 MG (50000 UNIT) CAPS capsule; 1 po q wed and 1 po q sun  Dispense: 8 capsule; Refill: 0  3. Low HDL (under 40) Christina [name] will continue her prudent nutritional plan and will increase her exercise.   4.  Hair loss With Gerldine's history of anxiety etc, she absolutely should not entertain thyroid medications with a completely normal TSH and T4. She will continue to follow up with Dermatology in regards to hair loss.  5. Mood disorder, with emotional eating Behavior modification techniques were discussed today to help [name] deal with her emotional/non-hunger eating behaviors. We will refill Topamax for 1 month. Orders and follow up as documented in patient record.   - topiramate (TOPAMAX) 50 MG tablet; 1 po twice daily  Dispense: 60 tablet; Refill: 0  6. At risk for diabetes mellitus Christina [name] was given approximately 15 minutes of diabetes education and counseling today. We discussed intensive lifestyle modifications today with an emphasis on weight loss as well as increasing exercise and decreasing simple carbohydrates in her diet. We also reviewed medication options with an emphasis on risk versus benefit of those discussed.    Repetitive spaced learning was employed today to elicit superior memory formation and behavioral change.  7. Obesity with current BMI of 31.5 Christina [name] is currently in the action stage of change. As such, her goal is to continue with weight loss efforts. She has agreed to the Category 3 Plan.   Exercise goals: As is, increase as tolerated.  Behavioral modification strategies: increasing lean protein intake, decreasing simple carbohydrates, and decreasing eating out.  Christina [name] has agreed to follow-up with our clinic in 3 weeks. She was informed of the importance of frequent follow-up visits to maximize her success with intensive lifestyle modifications for her multiple health conditions.   Objective:   Blood pressure 124/79, pulse 72, temperature (!) 97.5 F (36.4 C), height 5\' 7"  (1.702 m), weight 201 lb (91.2 kg), SpO2 99 %. Body mass index is 31.48 kg/m.  General: Cooperative, alert, well developed, in no acute distress. HEENT: Conjunctivae and lids  unremarkable. Cardiovascular: Regular rhythm.  Lungs: Normal work of breathing. Neurologic: No focal deficits.   Lab Results  Component Value Date   CREATININE 0.87 09/19/2021   BUN 15 09/19/2021   NA 141 09/19/2021   K 4.2 09/19/2021   CL 107 (H) 09/19/2021   CO2 22 09/19/2021   Lab Results  Component Value Date   ALT 26 09/19/2021   AST 15 09/19/2021   ALKPHOS 61 09/19/2021   BILITOT <0.2 09/19/2021   Lab Results  Component Value Date   HGBA1C 6.1 (H) 09/19/2021   HGBA1C 5.8 (H) 04/10/2021   HGBA1C 6.0 (H) 11/13/2020   HGBA1C 6.3 (H) 05/15/2020   Lab Results  Component Value Date   INSULIN 25.2 (H) 09/19/2021   INSULIN 18.4 11/13/2020   INSULIN 34.7 (H) 05/15/2020   Lab Results  Component Value Date   TSH 4.130 09/19/2021   Lab Results  Component Value Date   CHOL 120 09/19/2021   HDL 43 09/19/2021   LDLCALC 62 09/19/2021   TRIG 73 09/19/2021   Lab Results  Component Value Date   VD25OH 29.5 (L) 09/19/2021   VD25OH 51.4 04/10/2021   VD25OH 31.3 11/13/2020   Lab Results  Component Value Date   WBC 7.4 04/10/2021   HGB 13.5 04/10/2021   HCT 43.1 04/10/2021   MCV 88 04/10/2021   PLT 305 04/10/2021   No results found for: IRON, TIBC, FERRITIN  Attestation Statements:   Reviewed by clinician on day of visit: allergies, medications, problem list, medical history, surgical history, family history, social history, and previous encounter notes.   Wilhemena Durie, am acting as transcriptionist for [employer], DO.  I have reviewed the above documentation for accuracy and completeness, and I agree with the above. Marjory Sneddon, D.O.  The [place] was signed into law in 2016 which includes the topic of electronic health records.  This provides immediate access to information in MyChart.  This includes consultation notes, operative notes, office notes, lab results and pathology reports.  If you have any questions about what you read  please let us know at your next visit so we can discuss your concerns and take corrective action if need be.  We are right here with you.

## 2021-10-14 ENCOUNTER — Ambulatory Visit (INDEPENDENT_AMBULATORY_CARE_PROVIDER_SITE_OTHER): Payer: 59 | Admitting: Family Medicine

## 2021-10-14 ENCOUNTER — Encounter (INDEPENDENT_AMBULATORY_CARE_PROVIDER_SITE_OTHER): Payer: Self-pay | Admitting: Family Medicine

## 2021-10-14 ENCOUNTER — Other Ambulatory Visit: Payer: Self-pay

## 2021-10-14 VITALS — BP 124/73 | HR 77 | Temp 98.0°F | Ht 67.0 in | Wt 200.0 lb

## 2021-10-14 DIAGNOSIS — F39 Unspecified mood [affective] disorder: Secondary | ICD-10-CM | POA: Diagnosis not present

## 2021-10-14 DIAGNOSIS — R7303 Prediabetes: Secondary | ICD-10-CM

## 2021-10-14 DIAGNOSIS — E669 Obesity, unspecified: Secondary | ICD-10-CM

## 2021-10-14 DIAGNOSIS — Z6831 Body mass index (BMI) 31.0-31.9, adult: Secondary | ICD-10-CM

## 2021-10-14 DIAGNOSIS — E559 Vitamin D deficiency, unspecified: Secondary | ICD-10-CM | POA: Diagnosis not present

## 2021-10-14 MED ORDER — VITAMIN D (ERGOCALCIFEROL) 1.25 MG (50000 UNIT) PO CAPS: ORAL_CAPSULE | ORAL | 0 refills | Status: DC

## 2021-10-14 MED ORDER — TOPIRAMATE 50 MG PO TABS: ORAL_TABLET | ORAL | 0 refills | Status: DC

## 2021-10-14 MED ORDER — METFORMIN HCL 500 MG PO TABS: ORAL_TABLET | ORAL | 0 refills | Status: DC

## 2021-10-15 NOTE — Progress Notes (Signed)
Chief Complaint:   OBESITY Christina [name] is here to discuss her progress with her obesity treatment plan along with follow-up of her obesity related diagnoses. Christina [name] is on the Category 3 Plan and states she is following her eating plan approximately 50% of the time. Christina [name] states she is walking for 20 minutes 3 times per week.  Today's visit was #: 21 Starting weight: 218 lbs Starting date: 05/15/2020 Today's weight: 200 lbs Today's date: 10/14/2021 Total lbs lost to date: 18 Total lbs lost since last in-office visit: 1  Interim History: Christina [name] is eating less "crap" lately, but still not eating on the plan. She skips food and not measuring or weighing protein or veggies. She has no issues with the plan, but she is just not ready for the challenge yet.  Subjective:   1. Pre-diabetes Ozempic is not covered by [employer], but she still has some and she is using 1 mg weekly. She tolerates metformin well that we started at her last office visit. She is up to 500 mg daily.  2. Vitamin D deficiency Christina [name] is currently taking prescription vitamin D 50,000 IU twice weekly. She denies nausea, vomiting or muscle weakness.  3. Mood disorder, with emotional eating Christina [name] struggles with emotional eating. She is taking topiramate.  Assessment/Plan:   1. Pre-diabetes Christina [name] agreed to increase metformin to 500 mg BID, and we will refill for 1 month. She will continue to work on weight loss, exercise, and decreasing simple carbohydrates to help decrease the risk of diabetes.   - metFORMIN (GLUCOPHAGE) 500 MG tablet; 1 po with lunch and dinner daily  Dispense: 60 tablet; Refill: 0  2. Vitamin D deficiency We will refill prescription Vitamin D for 1 month. Christina [name] will follow-up for routine testing of Vitamin D, at least 2-3 times per year to avoid over-replacement.  - Vitamin D, Ergocalciferol, (DRISDOL) 1.25 MG (50000 UNIT) CAPS capsule; 1 po q wed and 1 po q sun  Dispense: 8 capsule; Refill:  0  3. Mood disorder, with emotional eating Behavior modification techniques were discussed today to help Christina [name] deal with her emotional/non-hunger eating behaviors. We will refill topiramate for 1 month. Orders and follow up as documented in patient record.   - topiramate (TOPAMAX) 50 MG tablet; 1 po twice daily  Dispense: 60 tablet; Refill: 0  4. Obesity with current BMI of 31.4 Christina [name] is currently in the action stage of change. As such, her goal is to continue with weight loss efforts. She has agreed to the Category 3 Plan.   Exercise goals: For substantial health benefits, adults should do at least 150 minutes (2 hours and 30 minutes) a week of moderate-intensity, or 75 minutes (1 hour and 15 minutes) a week of vigorous-intensity aerobic physical activity, or an equivalent combination of moderate- and vigorous-intensity aerobic activity. Aerobic activity should be performed in episodes of at least 10 minutes, and preferably, it should be spread throughout the week.  Behavioral modification strategies: increasing lean protein intake, decreasing simple carbohydrates, and planning for success.  Christina [name] has agreed to follow-up with our clinic in 3 to 4 weeks. She was informed of the importance of frequent follow-up visits to maximize her success with intensive lifestyle modifications for her multiple health conditions.   Objective:   Blood pressure 124/73, pulse 77, temperature 98 F (36.7 C), height 5\' 7"  (1.702 m), weight 200 lb (90.7 kg), SpO2 99 %. Body mass index is 31.32 kg/m.  General: Cooperative, alert, well developed, in no  acute distress. HEENT: Conjunctivae and lids unremarkable. Cardiovascular: Regular rhythm.  Lungs: Normal work of breathing. Neurologic: No focal deficits.   Lab Results  Component Value Date   CREATININE 0.87 09/19/2021   BUN 15 09/19/2021   NA 141 09/19/2021   K 4.2 09/19/2021   CL 107 (H) 09/19/2021   CO2 22 09/19/2021   Lab Results  Component  Value Date   ALT 26 09/19/2021   AST 15 09/19/2021   ALKPHOS 61 09/19/2021   BILITOT <0.2 09/19/2021   Lab Results  Component Value Date   HGBA1C 6.1 (H) 09/19/2021   HGBA1C 5.8 (H) 04/10/2021   HGBA1C 6.0 (H) 11/13/2020   HGBA1C 6.3 (H) 05/15/2020   Lab Results  Component Value Date   INSULIN 25.2 (H) 09/19/2021   INSULIN 18.4 11/13/2020   INSULIN 34.7 (H) 05/15/2020   Lab Results  Component Value Date   TSH 4.130 09/19/2021   Lab Results  Component Value Date   CHOL 120 09/19/2021   HDL 43 09/19/2021   LDLCALC 62 09/19/2021   TRIG 73 09/19/2021   Lab Results  Component Value Date   VD25OH 29.5 (L) 09/19/2021   VD25OH 51.4 04/10/2021   VD25OH 31.3 11/13/2020   Lab Results  Component Value Date   WBC 7.4 04/10/2021   HGB 13.5 04/10/2021   HCT 43.1 04/10/2021   MCV 88 04/10/2021   PLT 305 04/10/2021   No results found for: IRON, TIBC, FERRITIN  Attestation Statements:   Reviewed by clinician on day of visit: allergies, medications, problem list, medical history, surgical history, family history, social history, and previous encounter notes.   Wilhemena Durie, am acting as transcriptionist for [employer], DO.  I have reviewed the above documentation for accuracy and completeness, and I agree with the above. Marjory Sneddon, D.O.  The [employer] was signed into law in 2016 which includes the topic of electronic health records.  This provides immediate access to information in MyChart.  This includes consultation notes, operative notes, office notes, lab results and pathology reports.  If you have any questions about what you read please let us know at your next visit so we can discuss your concerns and take corrective action if need be.  We are right here with you.

## 2021-10-17 ENCOUNTER — Telehealth (INDEPENDENT_AMBULATORY_CARE_PROVIDER_SITE_OTHER): Payer: Self-pay | Admitting: Family Medicine

## 2021-10-17 NOTE — Progress Notes (Signed)
[facility] Note  10/22/2021     CHIEF COMPLAINT Patient presents for Retina Follow Up   HISTORY OF PRESENT ILLNESS: Christina [name] is a 56 y.o. female who presents to the clinic today for:  HPI     Retina Follow Up   Patient presents with  Retinal Break/Detachment.  In left eye.  Severity is moderate.  Duration of 9 months.  Since onset it is gradually worsening.  I, the attending physician,  performed the HPI with the patient and updated documentation appropriately.        Comments   Pt here for delayed follow up at 9 months, meant to be seen 4-6 mo f/u from May 2022. Pt states last week she was at work and noticed a decrease in her up close vision OU. No other concerns other than a few floaters here and there. Pt reports cataract extraction OD in July 2022. Pt reports last AEE she had, which was in July 2022, she is concerned the rx she was given is not correct. Reports taking Restasis BID OU.       Last edited by Bernarda Caffey, MD on 10/22/2021  8:41 AM.    pt is delayed to follow up from 4-6 months to 9 months for RD OS f/u, she states she is having trouble seeing up close, she was trying to cut a white string on a white piece of cloth and had a hard time seeing the string, she got new glasses in October and feels like the rx might not be correct   Referring physician: Maurice Small, MD [address],  [place] [address]  HISTORICAL INFORMATION:    Selected notes from the MEDICAL RECORD NUMBER Referred by Dr. Quentin Ore for RD OS   CURRENT MEDICATIONS: Current Outpatient Medications (Ophthalmic Drugs)  Medication Sig   cycloSPORINE (RESTASIS) 0.05 % ophthalmic emulsion Place 1 drop into both eyes 2 (two) times daily.   No current facility-administered medications for this visit. (Ophthalmic Drugs)   Current Outpatient Medications (Other)  Medication Sig   ALPRAZolam (XANAX) 0.25 MG tablet Take 0.25 mg by mouth  daily as needed for anxiety.   amitriptyline (ELAVIL) 100 MG tablet Take 0.5 tablets (50 mg total) by mouth at bedtime.   FLUoxetine (PROZAC) 40 MG capsule Take 40 mg by mouth daily.   metFORMIN (GLUCOPHAGE) 500 MG tablet 1 po with lunch and dinner daily   Semaglutide, 1 MG/DOSE, 4 MG/3ML SOPN Inject 1 mg as directed once a week.   tamoxifen (NOLVADEX) 20 MG tablet Take 1 tablet (20 mg total) by mouth daily.   topiramate (TOPAMAX) 50 MG tablet 1 po twice daily   vitamin B-12 (CYANOCOBALAMIN) 500 MCG tablet Take 1 tablet (500 mcg total) by mouth daily.   Vitamin D, Ergocalciferol, (DRISDOL) 1.25 MG (50000 UNIT) CAPS capsule 1 po q wed and 1 po q sun   No current facility-administered medications for this visit. (Other)   REVIEW OF SYSTEMS: ROS   Positive for: Cardiovascular, Eyes, Psychiatric, Heme/Lymph Negative for: Constitutional, Gastrointestinal, Neurological, Skin, Genitourinary, Musculoskeletal, HENT, Endocrine, Respiratory, Allergic/Imm Last edited by Kingsley Spittle, COT on 10/22/2021  7:53 AM.     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,  multifocal IDC, DCIS, ER/PR+,  01-09-2016 s/p bilteral mastectomies w/ left sln bx;  no chemoradiation   Cataracts, bilateral    Depression    GAD (generalized anxiety disorder)    Gallbladder problem    History of ovarian cyst    IBS (irritable bowel syndrome)    Joint pain    PONV (postoperative nausea and vomiting)    does well with scop patch   Pre-diabetes    followed by pcp; on metformin for weight loss -Dr Mellody Dance   Retinal detachment    Rheg OS   Right knee meniscal tear    Urgency of urination    Past Surgical History:  Procedure Laterality Date   ABDOMINAL HYSTERECTOMY  05/1996   endometriosis   ACHILLES TENDON REPAIR Right  2010;  revision 2011   BLADDER SUSPENSION  2000   BREAST BIOPSY Left 10/2015   BREAST IMPLANT REMOVAL Bilateral 08/12/2017   Procedure: REMOVAL BILATERAL BREAST IMPLANTS;  Surgeon: Wallace Going, DO;  Location: [place];  Service: Plastics;  Laterality: Bilateral;   BREAST RECONSTRUCTION WITH PLACEMENT OF TISSUE EXPANDER AND FLEX HD (ACELLULAR HYDRATED DERMIS) Bilateral 01/09/2016   BREAST RECONSTRUCTION WITH PLACEMENT OF TISSUE EXPANDER AND FLEX HD (ACELLULAR HYDRATED DERMIS) Bilateral 01/09/2016   Procedure: BREAST RECONSTRUCTION WITH PLACEMENT OF TISSUE EXPANDER AND FLEX HD (ACELLULAR HYDRATED DERMIS);  Surgeon: Loel Lofty Dillingham, DO;  Location: [place];  Service: Plastics;  Laterality: Bilateral;   BREAST RECONSTRUCTION WITH PLACEMENT OF TISSUE EXPANDER AND FLEX HD (ACELLULAR HYDRATED DERMIS) Bilateral 05/29/2016   Procedure: PLACEMENT OF BILATERAL TISSUE EXPANDER AND FLEX HD (ACELLULAR HYDRATED DERMIS);  Surgeon: Wallace Going, DO;  Location: [place];  Service: Plastics;  Laterality: Bilateral;   BREAST REDUCTION SURGERY Bilateral 11/26/2016   Procedure: BILATERAL BREAST CAPSULE CONTRACTURE RELASE;  Surgeon: Wallace Going, DO;  Location: [place];  Service: Plastics;  Laterality: Bilateral;   [place]; 1989; [place] Right x3   last one 02-01-2019 @ [facility]   EYE SURGERY Left 10/01/2020   Pneumatic retinopexy for repair of rheg RD - Dr. Bernarda Caffey   EYE SURGERY Left 10/04/2020   PPV - Dr. Bernarda Caffey   FAT GRAFTING BILATERAL BREAST  08-09-2018  _0    GAS INSERTION Left 10/04/2020   Procedure: INSERTION OF GAS;  Surgeon: Bernarda Caffey, MD;  Location: [place];  Service: Ophthalmology;  Laterality: Left;   GAS/FLUID EXCHANGE Left 10/04/2020   Procedure: GAS/FLUID EXCHANGE;  Surgeon: Bernarda Caffey, MD;  Location: [place];  Service: Ophthalmology;  Laterality: Left;   INCISION AND DRAINAGE OF WOUND Bilateral  02/11/2016   Procedure: IRRIGATION AND DEBRIDEMENT OF BILATERAL BREAST POCKET;  Surgeon: Wallace Going, DO;  Location: [place];  Service: Plastics;  Laterality: Bilateral;   KNEE ARTHROSCOPY WITH MEDIAL MENISECTOMY Right 12/20/2019   Procedure: KNEE ARTHROSCOPY WITH MEDIAL MENISECTOMY;  Surgeon: Marchia Bond, MD;  Location: [place];  Service: Orthopedics;  Laterality: Right;   LAPAROSCOPIC APPENDECTOMY  04-07-2011   _1    w/ Excision peritoneal lipoma and lysis adhesions   LAPAROSCOPIC CHOLECYSTECTOMY  ~ [place] Right 10/04/2020   Procedure: LASER RETINOPEXY WITH INDIRECT LASER OPTHALMOSCOPE, RIGHT EYE;  Surgeon: Bernarda Caffey, MD;  Location: [place];  Service: Ophthalmology;  Laterality: Right;   LIPOSUCTION WITH LIPOFILLING Bilateral 11/26/2016   Procedure: LIPOFILLING FOR SYMMETRY;  Surgeon: Loel Lofty Dillingham, DO;  Location: [facility];  Service: Plastics;  Laterality: Bilateral;   LIPOSUCTION WITH LIPOFILLING Bilateral 01/21/2017   Procedure: BILATERAL BREAST  LIPOFILLING FOR ASYMMETRY;  Surgeon: Wallace Going, DO;  Location: [place];  Service: Plastics;  Laterality: Bilateral;   LIPOSUCTION WITH LIPOFILLING Bilateral 06/28/2020   Procedure: Lipofilling bilateral breasts for asymmetry;  Surgeon: Wallace Going, DO;  Location: [place];  Service: Plastics;  Laterality: Bilateral;  90 min   MASTECTOMY Bilateral 01/09/2016   NIPPLE SPARING MASTECTOMY/SENTINAL LYMPH NODE BIOPSY/RECONSTRUCTION/PLACEMENT OF TISSUE EXPANDER Bilateral 01/09/2016   Procedure: BILATERAL NIPPLE SPARING MASTECTOMY WITH LEFT SENTINAL LYMPH NODE BIOPSY ;  Surgeon: Stark Klein, MD;  Location: [place];  Service: General;  Laterality: Bilateral;   PHOTOCOAGULATION WITH LASER Left 10/04/2020   Procedure: PHOTOCOAGULATION WITH LASER;  Surgeon: Bernarda Caffey, MD;  Location: [place];  Service: Ophthalmology;   Laterality: Left;   REMOVAL OF BILATERAL TISSUE EXPANDERS WITH PLACEMENT OF BILATERAL BREAST IMPLANTS Bilateral 08/20/2016   Procedure: REMOVAL OF BILATERAL TISSUE EXPANDERS WITH PLACEMENT OF BILATERAL SILICONE IMPLANTS;  Surgeon: Wallace Going, DO;  Location: [place];  Service: Plastics;  Laterality: Bilateral;   REMOVAL OF BILATERAL TISSUE EXPANDERS WITH PLACEMENT OF BILATERAL BREAST IMPLANTS Bilateral 11/05/2017   Procedure: REMOVAL OF BILATERAL TISSUE EXPANDERS WITH PLACEMENT OF BILATERAL BREAST SILICONE IMPLANTS;  Surgeon: Wallace Going, DO;  Location: [place];  Service: Plastics;  Laterality: Bilateral;   REMOVAL OF TISSUE EXPANDER Bilateral 02/11/2016   Procedure: REMOVAL OF BILATERAL TISSUE EXPANDERS AND FLEX HD REMOVAL;  Surgeon: Wallace Going, DO;  Location: [place];  Service: Plastics;  Laterality: Bilateral;   RETINAL DETACHMENT SURGERY Left 10/01/2020   Pneumatic retinopexy for repair of rheg RD - Dr. Bernarda Caffey   RETINAL DETACHMENT SURGERY Left 10/04/2020   PPV - Dr. Bernarda Caffey   SCLERAL BUCKLE Left 10/04/2020   Procedure: SCLERAL BUCKLE LEFT EYE;  Surgeon: Bernarda Caffey, MD;  Location: [place];  Service: Ophthalmology;  Laterality: Left;   TISSUE EXPANDER PLACEMENT Bilateral 08/12/2017   Procedure: PLACEMENT OF BILATERAL TISSUE EXPANDER;  Surgeon: Wallace Going, DO;  Location: [place];  Service: Plastics;  Laterality: Bilateral;   TUBAL LIGATION Bilateral 1991   VITRECTOMY 25 GAUGE WITH SCLERAL BUCKLE Left 10/04/2020   Procedure: 25 GAUGE PARS PLANA VITRECTOMY LEFT EYE ;  Surgeon: Bernarda Caffey, MD;  Location: [place];  Service: Ophthalmology;  Laterality: Left;   FAMILY HISTORY Family History  Problem Relation Age of Onset   Heart failure Father    Prostate cancer Father 14   Retinal detachment Father    Colon polyps Mother        approx 2   Other Mother        hx HPV and  hysterectomy due to precancerous cells   Depression Mother    Anxiety disorder Mother    Other Sister 73       hx of hysterectomy for unspecified reason; still has ovaries   Other Sister 13       paternal half-sister hx of hysterectomy for unspecified reason; still has ovaries   Bladder Cancer Maternal Uncle 79       not a smoker   Kidney failure Maternal Grandmother    Congestive Heart Failure Maternal Grandmother    Colon cancer Maternal Grandmother 70   Diabetes Maternal Grandmother    Lung cancer Maternal Grandfather 60       smoker  Breast cancer Paternal Grandmother        dx. early 64s; w/ hx of trauma to breast   Crohn's disease Daughter    Lung cancer Maternal Uncle 39       smoker   SOCIAL HISTORY Social History   Tobacco Use   Smoking status: Former    Packs/day: 1.00    Years: 10.00    Pack years: 10.00    Types: Cigarettes    Quit date: 06/08/2008    Years since quitting: 13.3   Smokeless tobacco: Never  Vaping Use   Vaping Use: Never used  Substance Use Topics   Alcohol use: No   Drug use: Never       OPHTHALMIC EXAM:  Base Eye Exam     Visual Acuity (Snellen - Linear)       Right Left   Dist cc 20/25 20/50 +1   Dist ph cc 20/20 -1 20/25 -2    Correction: Glasses         Tonometry (Tonopen, 8:11 AM)       Right Left   Pressure 14 11         Pupils       Dark Light Shape React APD   Right 3 2 Round Brisk None   Left 3 2 Round Brisk None         Visual Fields (Counting fingers)       Left Right    Full Full         Extraocular Movement       Right Left    Full, Ortho Full, Ortho         Neuro/Psych     Oriented x3: Yes   Mood/Affect: Normal         Dilation     Both eyes: 1.0% Mydriacyl, 2.5% Phenylephrine @ 8:12 AM           Slit Lamp and Fundus Exam     External Exam       Right Left   External  Periorbital edema         Slit Lamp Exam       Right Left   Lids/Lashes Dermatochalasis -  upper lid Dermatochalasis - upper lid   Conjunctiva/Sclera White and quiet White and quiet   Cornea 1+EBMD, Trace Punctate epithelial erosions, well healed cataract wound Trace Punctate epithelial erosions, mild tear film debris, well healed cataract wound   Anterior Chamber deep, clear, narrow angles deep, clear, narrow temporal angle, 0.5+cell   Iris Round and reactive Round and dilated, mild anterior bowing   Lens PC IOL in good position PC IOL in good position; 1+ PCO   Anterior Vitreous Vitreous syneresis, Posterior vitreous detachment, Weiss ring, trace pigment post vitrectomy, mild pigment         Fundus Exam       Right Left   Disc Pink and Sharp, temporal PPA Pink and Sharp, mild tilt, temporal PPA   C/D Ratio 0.3 0.5   Macula Flat, Blunted foveal reflex, RPE mottling, No heme or edema Flat, blunted foveal reflex, RPE mottling and clumping, No heme or edema   Vessels mild attenuation mild attenuation, mild tortuousity   Periphery Attached, small operculated hole at 0430, pigmented lattice degeneration inferiorly -- good laser changes surrounding, no new RT/RD retina attached over buckle, Good buckle height, good laser over buckle and around tear, PRE-OP: Bullous inferior detachment from 0300-0800, SRF extending to 0700; ORIGINALLY: Bullous RD from 0100-230,  large HST at 0130; mild pavingstone degen inferiorly           Refraction     Wearing Rx       Sphere Cylinder Axis Add   Right -1.00 +1.25 093 +2.50   Left -1.00 +1.75 061          Manifest Refraction       Sphere Cylinder Axis Dist VA   Right -0.75 +1.25 105 20/20-1   Left -1.25 +2.00 100 20/30-1            IMAGING AND PROCEDURES  Imaging and Procedures for 10/22/2021  OCT, Retina - OU - Both Eyes       Right Eye Quality was good. Central Foveal Thickness: 261. Progression has been stable. Findings include abnormal foveal contour, no IRF, no SRF.   Left Eye Quality was good. Central Foveal  Thickness: 291. Progression has been stable. Findings include normal foveal contour, no IRF, no SRF, outer retinal atrophy (Retina attached; Mild improvement in ellipsoid signal nasal macula).   Notes *Images captured and stored on drive  Diagnosis / Impression:  OD: abnormal foveal countour no IRF/SRF OS: NFP; no IRF/SRF; Retina attached; Mild improvement in ellipsoid signal nasal macula  Clinical management:  See below  Abbreviations: NFP - Normal foveal profile. CME - cystoid macular edema. PED - pigment epithelial detachment. IRF - intraretinal fluid. SRF - subretinal fluid. EZ - ellipsoid zone. ERM - epiretinal membrane. ORA - outer retinal atrophy. ORT - outer retinal tubulation. SRHM - subretinal hyper-reflective material. IRHM - intraretinal hyper-reflective material            ASSESSMENT/PLAN:    ICD-10-CM   1. Left retinal detachment  H33.22 OCT, Retina - OU - Both Eyes    2. Retinal hole of right eye  H33.321     3. Diabetes mellitus type 2 without retinopathy ([place])  E11.9 OCT, Retina - OU - Both Eyes    4. Pseudophakia, both eyes  Z96.1      1. 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 Retinal hole, OD - small operculated hole at 0430 - s/p laser retinopexy OD (01.27.22) in OR -- good laser in place  3. Diabetes mellitus, type 2 without retinopathy - The incidence, risk factors for progression, natural history and treatment options for diabetic retinopathy  were discussed with patient.   - The  need for close monitoring of blood glucose, blood pressure, and serum lipids, avoiding cigarette or any type of tobacco, and the need for long term follow up was also discussed with patient. - monitor  4. Pseudophakia OU  - s/p CE/IOL OU (OS: [place], 04.21.22, OD: 07.22)  - IOLs in good position, beautiful surgeries  - Glasses not matching BCVA -- would likely benefit from updated MRx - monitor  Ophthalmic Meds Ordered this visit:  No orders of the defined types were placed in this encounter.    Return in about 1 year (around 10/22/2022) for f/u RD OS, DFE, OCT.  There are no Patient Instructions on file for this visit.   Explained the diagnoses, plan, and follow up with the patient and they expressed understanding.  Patient expressed understanding of the importance of proper follow up care.   This document serves as a record of services personally performed by Gardiner Sleeper, MD, PhD. It was created on their behalf by San Jetty. Owens Shark, OA an ophthalmic technician. The creation of this record is the provider's dictation and/or activities during the visit.    Electronically signed by: San Jetty. Owens Shark, [place] 02.09.2023 8:44 AM  Gardiner Sleeper, M.D., Ph.D. Diseases & Surgery of the Retina and Vitreous [facility]  I have reviewed the above documentation for accuracy and completeness, and I agree with the above. Gardiner Sleeper, M.D., Ph.D. 10/22/21 8:44 AM   Abbreviations: M myopia (nearsighted); A astigmatism; H hyperopia (farsighted); P presbyopia; Mrx spectacle prescription;  CTL contact lenses; OD right eye; OS left eye; OU both eyes  XT exotropia; ET esotropia; PEK punctate epithelial keratitis; PEE punctate epithelial erosions; DES dry eye syndrome; MGD meibomian gland dysfunction; ATs artificial tears; PFAT's preservative free artificial tears; [place] nuclear sclerotic cataract; PSC posterior subcapsular cataract; ERM epi-retinal membrane; PVD posterior vitreous  detachment; RD retinal detachment; DM diabetes mellitus; DR diabetic retinopathy; NPDR non-proliferative diabetic retinopathy; PDR proliferative diabetic retinopathy; CSME clinically significant macular edema; DME diabetic macular edema; dbh dot blot hemorrhages; CWS cotton wool spot; POAG primary open angle glaucoma; C/D cup-to-disc ratio; HVF humphrey visual field; GVF goldmann visual field; OCT optical coherence tomography; IOP intraocular pressure; BRVO Branch retinal vein occlusion; CRVO central retinal vein occlusion; CRAO central retinal artery occlusion; BRAO branch retinal artery occlusion; RT retinal tear; SB scleral buckle; PPV pars plana vitrectomy; VH Vitreous hemorrhage; PRP panretinal laser photocoagulation; IVK intravitreal kenalog; VMT vitreomacular traction; MH Macular hole;  NVD neovascularization of the disc; NVE neovascularization elsewhere; AREDS age related eye disease study; ARMD age related macular degeneration; POAG primary open angle glaucoma; EBMD epithelial/anterior basement membrane dystrophy; ACIOL anterior chamber intraocular lens; IOL intraocular lens; PCIOL posterior chamber intraocular lens; Phaco/IOL phacoemulsification with intraocular lens placement; [place] photorefractive keratectomy; LASIK laser assisted in situ keratomileusis; HTN hypertension; DM diabetes mellitus; COPD chronic obstructive pulmonary disease

## 2021-10-17 NOTE — Telephone Encounter (Signed)
Christina [name] with Cover My [employer] called wanting to speak with someone concerning the Prior Authorization for Ozempic 1mg . Please call Cover My Meds at [phone]. REF # [id number]. Thank You!

## 2021-10-22 ENCOUNTER — Ambulatory Visit (INDEPENDENT_AMBULATORY_CARE_PROVIDER_SITE_OTHER): Payer: 59 | Admitting: Ophthalmology

## 2021-10-22 ENCOUNTER — Encounter (HOSPITAL_BASED_OUTPATIENT_CLINIC_OR_DEPARTMENT_OTHER): Payer: Self-pay | Admitting: Orthopaedic Surgery

## 2021-10-22 ENCOUNTER — Encounter (INDEPENDENT_AMBULATORY_CARE_PROVIDER_SITE_OTHER): Payer: Self-pay | Admitting: Ophthalmology

## 2021-10-22 ENCOUNTER — Other Ambulatory Visit: Payer: Self-pay

## 2021-10-22 ENCOUNTER — Encounter (INDEPENDENT_AMBULATORY_CARE_PROVIDER_SITE_OTHER): Payer: Self-pay

## 2021-10-22 DIAGNOSIS — Z961 Presence of intraocular lens: Secondary | ICD-10-CM | POA: Diagnosis not present

## 2021-10-22 DIAGNOSIS — H33321 Round hole, right eye: Secondary | ICD-10-CM

## 2021-10-22 DIAGNOSIS — H3322 Serous retinal detachment, left eye: Secondary | ICD-10-CM

## 2021-10-22 DIAGNOSIS — E119 Type 2 diabetes mellitus without complications: Secondary | ICD-10-CM

## 2021-10-22 DIAGNOSIS — H25811 Combined forms of age-related cataract, right eye: Secondary | ICD-10-CM

## 2021-10-22 NOTE — Telephone Encounter (Signed)
Prior authorization denied for Ozempic. Patient sent mychart message.

## 2021-10-28 ENCOUNTER — Encounter (HOSPITAL_BASED_OUTPATIENT_CLINIC_OR_DEPARTMENT_OTHER)
Admission: RE | Admit: 2021-10-28 | Discharge: 2021-10-28 | Disposition: A | Discharge status: Home / Self Care | Payer: 59 | Source: Ambulatory Visit | Attending: Orthopaedic Surgery | Admitting: Orthopaedic Surgery

## 2021-10-28 DIAGNOSIS — R7303 Prediabetes: Secondary | ICD-10-CM | POA: Insufficient documentation

## 2021-10-28 DIAGNOSIS — Z01812 Encounter for preprocedural laboratory examination: Secondary | ICD-10-CM | POA: Diagnosis present

## 2021-10-28 LAB — BASIC METABOLIC PANEL
Anion gap: 6 (ref 5–15)
BUN: 15 mg/dL (ref 6–20)
CO2: 27 mmol/L (ref 22–32)
Calcium: 10.3 mg/dL (ref 8.9–10.3)
Chloride: 109 mmol/L (ref 98–111)
Creatinine, Ser: 0.97 mg/dL (ref 0.44–1.00)
GFR, Estimated: >60 mL/min (ref >60)
Glucose, Bld: 88 mg/dL (ref 70–99)
Potassium: 4.6 mmol/L (ref 3.5–5.1)
Sodium: 142 mmol/L (ref 135–145)

## 2021-10-28 NOTE — Progress Notes (Signed)

## 2021-10-29 NOTE — Progress Notes (Signed)
EKG reviewed by Dr. Miller, will proceed with surgery as scheduled.  

## 2021-10-30 ENCOUNTER — Encounter (HOSPITAL_BASED_OUTPATIENT_CLINIC_OR_DEPARTMENT_OTHER): Payer: Self-pay | Admitting: Orthopaedic Surgery

## 2021-10-30 ENCOUNTER — Encounter: Payer: Self-pay | Admitting: Orthopaedic Surgery

## 2021-10-30 ENCOUNTER — Ambulatory Visit (HOSPITAL_BASED_OUTPATIENT_CLINIC_OR_DEPARTMENT_OTHER): Payer: 59 | Admitting: Certified Registered"

## 2021-10-30 ENCOUNTER — Ambulatory Visit (HOSPITAL_BASED_OUTPATIENT_CLINIC_OR_DEPARTMENT_OTHER)
Admission: RE | Admit: 2021-10-30 | Discharge: 2021-10-30 | Disposition: A | Discharge status: Home / Self Care | Payer: 59 | Attending: Orthopaedic Surgery | Admitting: Orthopaedic Surgery

## 2021-10-30 ENCOUNTER — Other Ambulatory Visit: Payer: Self-pay

## 2021-10-30 ENCOUNTER — Encounter (HOSPITAL_BASED_OUTPATIENT_CLINIC_OR_DEPARTMENT_OTHER)
Admission: RE | Disposition: A | Discharge status: Home / Self Care | Payer: Self-pay | Source: Home / Self Care | Attending: Orthopaedic Surgery

## 2021-10-30 DIAGNOSIS — F32A Depression, unspecified: Secondary | ICD-10-CM | POA: Insufficient documentation

## 2021-10-30 DIAGNOSIS — Z87891 Personal history of nicotine dependence: Secondary | ICD-10-CM | POA: Diagnosis not present

## 2021-10-30 DIAGNOSIS — S83241A Other tear of medial meniscus, current injury, right knee, initial encounter: Secondary | ICD-10-CM | POA: Insufficient documentation

## 2021-10-30 DIAGNOSIS — M659 Synovitis and tenosynovitis, unspecified: Secondary | ICD-10-CM | POA: Diagnosis not present

## 2021-10-30 DIAGNOSIS — E119 Type 2 diabetes mellitus without complications: Secondary | ICD-10-CM | POA: Insufficient documentation

## 2021-10-30 DIAGNOSIS — X58XXXA Exposure to other specified factors, initial encounter: Secondary | ICD-10-CM | POA: Diagnosis not present

## 2021-10-30 DIAGNOSIS — I447 Left bundle-branch block, unspecified: Secondary | ICD-10-CM | POA: Insufficient documentation

## 2021-10-30 DIAGNOSIS — M94261 Chondromalacia, right knee: Secondary | ICD-10-CM | POA: Insufficient documentation

## 2021-10-30 DIAGNOSIS — M65861 Other synovitis and tenosynovitis, right lower leg: Secondary | ICD-10-CM | POA: Diagnosis not present

## 2021-10-30 DIAGNOSIS — F411 Generalized anxiety disorder: Secondary | ICD-10-CM | POA: Diagnosis not present

## 2021-10-30 DIAGNOSIS — R7303 Prediabetes: Secondary | ICD-10-CM

## 2021-10-30 HISTORY — PX: KNEE ARTHROSCOPY WITH MEDIAL MENISECTOMY: SHX5651

## 2021-10-30 LAB — GLUCOSE, CAPILLARY
Glucose-Capillary: 83 mg/dL (ref 70–99)
Glucose-Capillary: 85 mg/dL (ref 70–99)

## 2021-10-30 SURGERY — ARTHROSCOPY, KNEE, WITH MEDIAL MENISCECTOMY
Anesthesia: General | Site: Knee | Laterality: Right

## 2021-10-30 MED ORDER — SCOPOLAMINE 1 MG/3DAYS TD PT72
MEDICATED_PATCH | TRANSDERMAL | Status: AC
  Filled 2021-10-30: qty 1

## 2021-10-30 MED ORDER — FENTANYL CITRATE (PF) 100 MCG/2ML IJ SOLN
INTRAMUSCULAR | Status: DC | PRN
  Administered 2021-10-30: 50 ug via INTRAVENOUS
  Administered 2021-10-30: 25 ug via INTRAVENOUS
  Administered 2021-10-30: 50 ug via INTRAVENOUS

## 2021-10-30 MED ORDER — KETAMINE HCL 10 MG/ML IJ SOLN
INTRAMUSCULAR | Status: DC | PRN
  Administered 2021-10-30: 50 mg via INTRAVENOUS

## 2021-10-30 MED ORDER — BUPIVACAINE HCL (PF) 0.25 % IJ SOLN
INTRAMUSCULAR | Status: DC | PRN
  Administered 2021-10-30: 20 mL

## 2021-10-30 MED ORDER — FENTANYL CITRATE (PF) 100 MCG/2ML IJ SOLN: 25.0000 ug | INTRAMUSCULAR | Status: DC | PRN

## 2021-10-30 MED ORDER — LACTATED RINGERS IV SOLN: INTRAVENOUS | Status: DC

## 2021-10-30 MED ORDER — ACETAMINOPHEN 500 MG PO TABS
1000.0000 mg | ORAL_TABLET | Freq: Once | ORAL | Status: AC
  Administered 2021-10-30: 1000 mg via ORAL

## 2021-10-30 MED ORDER — HYDROCODONE-ACETAMINOPHEN 5-325 MG PO TABS: 1.0000 | ORAL_TABLET | Freq: Four times a day (QID) | ORAL | 0 refills | Status: DC | PRN

## 2021-10-30 MED ORDER — FENTANYL CITRATE (PF) 100 MCG/2ML IJ SOLN
INTRAMUSCULAR | Status: AC
  Filled 2021-10-30: qty 2

## 2021-10-30 MED ORDER — SODIUM CHLORIDE 0.9 % IR SOLN
Status: DC | PRN
  Administered 2021-10-30: 6000 mL

## 2021-10-30 MED ORDER — LIDOCAINE 2% (20 MG/ML) 5 ML SYRINGE
INTRAMUSCULAR | Status: DC | PRN
Start: 2021-10-30 — End: 2021-10-30
  Administered 2021-10-30: 60 mg via INTRAVENOUS

## 2021-10-30 MED ORDER — PROPOFOL 10 MG/ML IV BOLUS
INTRAVENOUS | Status: DC | PRN
  Administered 2021-10-30: 200 mg via INTRAVENOUS

## 2021-10-30 MED ORDER — SCOPOLAMINE 1 MG/3DAYS TD PT72
1.0000 | MEDICATED_PATCH | TRANSDERMAL | Status: DC
  Administered 2021-10-30: 1.5 mg via TRANSDERMAL

## 2021-10-30 MED ORDER — ONDANSETRON HCL 4 MG/2ML IJ SOLN
INTRAMUSCULAR | Status: DC | PRN
  Administered 2021-10-30: 4 mg via INTRAVENOUS

## 2021-10-30 MED ORDER — CEFAZOLIN SODIUM-DEXTROSE 2-4 GM/100ML-% IV SOLN
INTRAVENOUS | Status: AC
  Filled 2021-10-30: qty 100

## 2021-10-30 MED ORDER — PROPOFOL 500 MG/50ML IV EMUL
INTRAVENOUS | Status: DC | PRN
  Administered 2021-10-30: 135 ug/kg/min via INTRAVENOUS

## 2021-10-30 MED ORDER — CEFAZOLIN SODIUM-DEXTROSE 2-4 GM/100ML-% IV SOLN
2.0000 g | INTRAVENOUS | Status: AC
  Administered 2021-10-30: 2 g via INTRAVENOUS

## 2021-10-30 MED ORDER — DEXAMETHASONE SODIUM PHOSPHATE 10 MG/ML IJ SOLN
INTRAMUSCULAR | Status: DC | PRN
  Administered 2021-10-30: 4 mg via INTRAVENOUS

## 2021-10-30 MED ORDER — KETAMINE HCL 100 MG/ML IJ SOLN
INTRAMUSCULAR | Status: AC
  Filled 2021-10-30: qty 1

## 2021-10-30 MED ORDER — MIDAZOLAM HCL 2 MG/2ML IJ SOLN
INTRAMUSCULAR | Status: AC
  Filled 2021-10-30: qty 2

## 2021-10-30 MED ORDER — MIDAZOLAM HCL 5 MG/5ML IJ SOLN
INTRAMUSCULAR | Status: DC | PRN
Start: 2021-10-30 — End: 2021-10-30
  Administered 2021-10-30: 2 mg via INTRAVENOUS

## 2021-10-30 MED ORDER — ACETAMINOPHEN 500 MG PO TABS
ORAL_TABLET | ORAL | Status: AC
  Filled 2021-10-30: qty 2

## 2021-10-30 SURGICAL SUPPLY — 35 items
BANDAGE ESMARK 6X9 LF (GAUZE/BANDAGES/DRESSINGS) IMPLANT
BLADE EXCALIBUR 4.0X13 (MISCELLANEOUS) ×2 IMPLANT
BLADE SHAVER TORPEDO 4X13 (MISCELLANEOUS) IMPLANT
BNDG CMPR 9X6 STRL LF SNTH (GAUZE/BANDAGES/DRESSINGS)
BNDG ELASTIC 6X5.8 VLCR STR LF (GAUZE/BANDAGES/DRESSINGS) ×4 IMPLANT
BNDG ESMARK 6X9 LF (GAUZE/BANDAGES/DRESSINGS)
BURR OVAL 8 FLU 4.0X13 (MISCELLANEOUS) IMPLANT
COOLER ICEMAN CLASSIC (MISCELLANEOUS) ×2 IMPLANT
CUFF TOURN SGL QUICK 34 (TOURNIQUET CUFF) ×2
CUFF TRNQT CYL 34X4.125X (TOURNIQUET CUFF) ×1 IMPLANT
CUTTER BONE 4.0MM X 13CM (MISCELLANEOUS) IMPLANT
DISSECTOR 3.5MM X 13CM CVD (MISCELLANEOUS) ×1 IMPLANT
DRAPE ARTHROSCOPY W/POUCH 90 (DRAPES) ×2 IMPLANT
DRAPE IMP U-DRAPE 54X76 (DRAPES) ×2 IMPLANT
DRAPE U-SHAPE 47X51 STRL (DRAPES) ×2 IMPLANT
DURAPREP 26ML APPLICATOR (WOUND CARE) ×4 IMPLANT
EXCALIBUR 3.8MM X 13CM (MISCELLANEOUS) IMPLANT
GAUZE SPONGE 4X4 12PLY STRL (GAUZE/BANDAGES/DRESSINGS) ×2 IMPLANT
GAUZE XEROFORM 1X8 LF (GAUZE/BANDAGES/DRESSINGS) ×2 IMPLANT
GLOVE SURG SYN 7.5  E (GLOVE) ×2
GLOVE SURG SYN 7.5 E (GLOVE) ×1 IMPLANT
GLOVE SURG SYN 7.5 PF PI (GLOVE) ×1 IMPLANT
GLOVE SURG UNDER POLY LF SZ7 (GLOVE) ×4 IMPLANT
GLOVE SURG UNDER POLY LF SZ7.5 (GLOVE) ×2 IMPLANT
GOWN STRL REIN XL XLG (GOWN DISPOSABLE) ×4 IMPLANT
GOWN STRL REUS W/ TWL LRG LVL3 (GOWN DISPOSABLE) ×1 IMPLANT
GOWN STRL REUS W/TWL LRG LVL3 (GOWN DISPOSABLE) ×2
MANIFOLD NEPTUNE II (INSTRUMENTS) ×2 IMPLANT
PACK ARTHROSCOPY DSU (CUSTOM PROCEDURE TRAY) ×2 IMPLANT
PACK BASIN DAY SURGERY FS (CUSTOM PROCEDURE TRAY) ×2 IMPLANT
PAD COLD SHLDR WRAP-ON (PAD) ×2 IMPLANT
SHEET MEDIUM DRAPE 40X70 STRL (DRAPES) ×2 IMPLANT
SUT ETHILON 3 0 PS 1 (SUTURE) ×2 IMPLANT
TOWEL GREEN STERILE FF (TOWEL DISPOSABLE) ×2 IMPLANT
TUBING ARTHROSCOPY IRRIG 16FT (MISCELLANEOUS) ×2 IMPLANT

## 2021-10-30 NOTE — Anesthesia Postprocedure Evaluation (Signed)
Anesthesia Post Note  Patient: Madine Sarr Sparacino  Procedure(s) Performed: RIGHT KNEE ARTHROSCOPY WITH PARTIAL MEDIAL MENISECTOMY SYNOVECTOMY (Right: Knee)     Patient location during evaluation: PACU Anesthesia Type: General Level of consciousness: awake and alert Pain management: pain level controlled Vital Signs Assessment: post-procedure vital signs reviewed and stable Respiratory status: spontaneous breathing, nonlabored ventilation, respiratory function stable and patient connected to nasal cannula oxygen Cardiovascular status: blood pressure returned to baseline and stable Postop Assessment: no apparent nausea or vomiting Anesthetic complications: no   No notable events documented.  Last Vitals:  Vitals:   10/30/21 1330 10/30/21 1342  BP:  118/82  Pulse: 67 68  Resp: 18 18  Temp:  36.7 C  SpO2:  100%    Last Pain:  Vitals:   10/30/21 1342  TempSrc:   PainSc: 0-No pain                 Jamas Jaquay L Lexianna Weinrich

## 2021-10-30 NOTE — Transfer of Care (Signed)
Immediate Anesthesia Transfer of Care Note  Patient: Christina [name]  Procedure(s) Performed: RIGHT KNEE ARTHROSCOPY WITH PARTIAL MEDIAL MENISECTOMY SYNOVECTOMY (Right: Knee)  Patient Location: PACU  Anesthesia Type:General  Level of Consciousness: drowsy and patient cooperative  Airway & Oxygen Therapy: Patient Spontanous Breathing and Patient connected to face mask oxygen  Post-op Assessment: Report given to RN and Post -op Vital signs reviewed and stable  Post vital signs: Reviewed and stable  Last Vitals:  Vitals Value Taken Time  BP 93/52 10/30/21 1247  Temp    Pulse 72 10/30/21 1248  Resp 18 10/30/21 1248  SpO2 95 % 10/30/21 1248  Vitals shown include unvalidated device data.  Last Pain:  Vitals:   10/30/21 1015  TempSrc: Oral  PainSc: 0-No pain         Complications: No notable events documented.

## 2021-10-30 NOTE — Anesthesia Procedure Notes (Signed)
Procedure Name: LMA Insertion Date/Time: 10/30/2021 12:17 PM Performed by: Signe Colt, CRNA Pre-anesthesia Checklist: Patient identified, Emergency Drugs available, Suction available and Patient being monitored Patient Re-evaluated:Patient Re-evaluated prior to induction Oxygen Delivery Method: Circle System Utilized Preoxygenation: Pre-oxygenation with 100% oxygen Induction Type: IV induction Ventilation: Mask ventilation without difficulty LMA: LMA inserted LMA Size: 4.0 Number of attempts: 1 Airway Equipment and Method: bite block Placement Confirmation: positive ETCO2 Tube secured with: Tape Dental Injury: Teeth and Oropharynx as per pre-operative assessment

## 2021-10-30 NOTE — Discharge Instructions (Addendum)

## 2021-10-30 NOTE — H&P (Signed)
PREOPERATIVE H&P  Chief Complaint: right knee medial meniscal tear  HPI: Christina [name] is a 56 y.o. female who presents for surgical treatment of right knee medial meniscal tear.  She denies any changes in medical history.  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,  multifocal IDC, DCIS, ER/PR+,  01-09-2016 s/p bilteral mastectomies w/ left sln bx;  no chemoradiation   Cataracts, bilateral    Depression    GAD (generalized anxiety disorder)    Gallbladder problem    History of ovarian cyst    IBS (irritable bowel syndrome)    Joint pain    PONV (postoperative nausea and vomiting)    does well with scop patch   Pre-diabetes    followed by pcp; on metformin for weight loss -Dr Mellody Dance   Retinal detachment    Rheg OS   Right knee meniscal tear    Urgency of urination    Past Surgical History:  Procedure Laterality Date   ABDOMINAL HYSTERECTOMY  05/1996   endometriosis   ACHILLES TENDON REPAIR Right 2010;  revision 2011   BLADDER SUSPENSION  2000   BREAST BIOPSY Left 10/2015   BREAST IMPLANT REMOVAL Bilateral 08/12/2017   Procedure: REMOVAL BILATERAL BREAST IMPLANTS;  Surgeon: Wallace Going, DO;  Location: [place];  Service: Plastics;  Laterality: Bilateral;   BREAST RECONSTRUCTION WITH PLACEMENT OF TISSUE EXPANDER AND FLEX HD (ACELLULAR HYDRATED DERMIS) Bilateral 01/09/2016   BREAST RECONSTRUCTION WITH PLACEMENT OF TISSUE EXPANDER AND FLEX HD (ACELLULAR HYDRATED DERMIS) Bilateral 01/09/2016   Procedure: BREAST RECONSTRUCTION WITH PLACEMENT OF TISSUE EXPANDER AND FLEX HD (ACELLULAR HYDRATED DERMIS);  Surgeon: Loel Lofty Dillingham, DO;  Location: [place];  Service: Plastics;  Laterality: Bilateral;   BREAST RECONSTRUCTION WITH PLACEMENT OF TISSUE EXPANDER AND FLEX HD (ACELLULAR HYDRATED DERMIS) Bilateral 05/29/2016    Procedure: PLACEMENT OF BILATERAL TISSUE EXPANDER AND FLEX HD (ACELLULAR HYDRATED DERMIS);  Surgeon: Wallace Going, DO;  Location: [place];  Service: Plastics;  Laterality: Bilateral;   BREAST REDUCTION SURGERY Bilateral 11/26/2016   Procedure: BILATERAL BREAST CAPSULE CONTRACTURE RELASE;  Surgeon: Wallace Going, DO;  Location: [place];  Service: Plastics;  Laterality: Bilateral;   [place]; 1989; [place] Right x3   last one 02-01-2019 @ [facility]   EYE SURGERY Left 10/01/2020   Pneumatic retinopexy for repair of rheg RD - Dr. Bernarda Caffey   EYE SURGERY Left 10/04/2020   PPV - Dr. Bernarda Caffey   FAT GRAFTING BILATERAL BREAST  08-09-2018  @[facility]    GAS INSERTION Left 10/04/2020   Procedure: INSERTION OF GAS;  Surgeon: Bernarda Caffey, MD;  Location: [place];  Service: Ophthalmology;  Laterality: Left;   GAS/FLUID EXCHANGE Left 10/04/2020   Procedure: GAS/FLUID EXCHANGE;  Surgeon: Bernarda Caffey, MD;  Location: [place];  Service: Ophthalmology;  Laterality: Left;   INCISION AND DRAINAGE OF WOUND Bilateral 02/11/2016   Procedure: IRRIGATION AND DEBRIDEMENT OF BILATERAL BREAST POCKET;  Surgeon: Wallace Going, DO;  Location: [place];  Service: Plastics;  Laterality: Bilateral;   KNEE ARTHROSCOPY WITH MEDIAL MENISECTOMY Right 12/20/2019   Procedure: KNEE ARTHROSCOPY WITH MEDIAL MENISECTOMY;  Surgeon: Marchia Bond, MD;  Location: [place];  Service: Orthopedics;  Laterality: Right;  LAPAROSCOPIC APPENDECTOMY  04-07-2011   @[facility]    w/ Excision peritoneal lipoma and lysis adhesions   LAPAROSCOPIC CHOLECYSTECTOMY  ~ [place] Right 10/04/2020   Procedure: LASER RETINOPEXY WITH INDIRECT LASER OPTHALMOSCOPE, RIGHT EYE;  Surgeon: Bernarda Caffey, MD;  Location: [place];  Service: Ophthalmology;  Laterality: Right;   LIPOSUCTION WITH LIPOFILLING Bilateral 11/26/2016   Procedure: LIPOFILLING FOR  SYMMETRY;  Surgeon: Wallace Going, DO;  Location: [place];  Service: Plastics;  Laterality: Bilateral;   LIPOSUCTION WITH LIPOFILLING Bilateral 01/21/2017   Procedure: BILATERAL BREAST  LIPOFILLING FOR ASYMMETRY;  Surgeon: Wallace Going, DO;  Location: [place];  Service: Plastics;  Laterality: Bilateral;   LIPOSUCTION WITH LIPOFILLING Bilateral 06/28/2020   Procedure: Lipofilling bilateral breasts for asymmetry;  Surgeon: Wallace Going, DO;  Location: [place];  Service: Plastics;  Laterality: Bilateral;  90 min   MASTECTOMY Bilateral 01/09/2016   NIPPLE SPARING MASTECTOMY/SENTINAL LYMPH NODE BIOPSY/RECONSTRUCTION/PLACEMENT OF TISSUE EXPANDER Bilateral 01/09/2016   Procedure: BILATERAL NIPPLE SPARING MASTECTOMY WITH LEFT SENTINAL LYMPH NODE BIOPSY ;  Surgeon: Stark Klein, MD;  Location: [place];  Service: General;  Laterality: Bilateral;   PHOTOCOAGULATION WITH LASER Left 10/04/2020   Procedure: PHOTOCOAGULATION WITH LASER;  Surgeon: Bernarda Caffey, MD;  Location: [place];  Service: Ophthalmology;  Laterality: Left;   REMOVAL OF BILATERAL TISSUE EXPANDERS WITH PLACEMENT OF BILATERAL BREAST IMPLANTS Bilateral 08/20/2016   Procedure: REMOVAL OF BILATERAL TISSUE EXPANDERS WITH PLACEMENT OF BILATERAL SILICONE IMPLANTS;  Surgeon: Wallace Going, DO;  Location: [place];  Service: Plastics;  Laterality: Bilateral;   REMOVAL OF BILATERAL TISSUE EXPANDERS WITH PLACEMENT OF BILATERAL BREAST IMPLANTS Bilateral 11/05/2017   Procedure: REMOVAL OF BILATERAL TISSUE EXPANDERS WITH PLACEMENT OF BILATERAL BREAST SILICONE IMPLANTS;  Surgeon: Wallace Going, DO;  Location: [place];  Service: Plastics;  Laterality: Bilateral;   REMOVAL OF TISSUE EXPANDER Bilateral 02/11/2016   Procedure: REMOVAL OF BILATERAL TISSUE EXPANDERS AND FLEX HD REMOVAL;  Surgeon: Wallace Going, DO;  Location: [place];   Service: Plastics;  Laterality: Bilateral;   RETINAL DETACHMENT SURGERY Left 10/01/2020   Pneumatic retinopexy for repair of rheg RD - Dr. Bernarda Caffey   RETINAL DETACHMENT SURGERY Left 10/04/2020   PPV - Dr. Bernarda Caffey   SCLERAL BUCKLE Left 10/04/2020   Procedure: SCLERAL BUCKLE LEFT EYE;  Surgeon: Bernarda Caffey, MD;  Location: [place];  Service: Ophthalmology;  Laterality: Left;   TISSUE EXPANDER PLACEMENT Bilateral 08/12/2017   Procedure: PLACEMENT OF BILATERAL TISSUE EXPANDER;  Surgeon: Wallace Going, DO;  Location: [place];  Service: Plastics;  Laterality: Bilateral;   TUBAL LIGATION Bilateral 1991   VITRECTOMY 25 GAUGE WITH SCLERAL BUCKLE Left 10/04/2020   Procedure: 25 GAUGE PARS PLANA VITRECTOMY LEFT EYE ;  Surgeon: Bernarda Caffey, MD;  Location: [place];  Service: Ophthalmology;  Laterality: Left;   Social History   Socioeconomic History   Marital status: Married    Spouse name: Not on file   Number of children: Not on file   Years of education: Not on file   Highest education level: Not on file  Occupational History   Occupation: Cutter    Employer: [employer]  Tobacco Use   Smoking status: Former    Packs/day: 1.00    Years: 10.00    Pack years: 10.00    Types: Cigarettes    Quit date: 06/08/2008  Years since quitting: 13.4   Smokeless tobacco: Never  Vaping Use   Vaping Use: Never used  Substance and Sexual Activity   Alcohol use: No   Drug use: Never   Sexual activity: Never    Birth control/protection: Surgical    Comment: hysterectomy  Other Topics Concern   Not on file  Social History Narrative   Not on file   Social Determinants of Health   Financial Resource Strain: Not on file  Food Insecurity: Not on file  Transportation Needs: Not on file  Physical Activity: Not on file  Stress: Not on file  Social Connections: Not on file   Family History  Problem Relation Age of Onset   Heart failure Father     Prostate cancer Father 35   Retinal detachment Father    Colon polyps Mother        approx 2   Other Mother        hx HPV and hysterectomy due to precancerous cells   Depression Mother    Anxiety disorder Mother    Other Sister 77       hx of hysterectomy for unspecified reason; still has ovaries   Other Sister 78       paternal half-sister hx of hysterectomy for unspecified reason; still has ovaries   Bladder Cancer Maternal Uncle 79       not a smoker   Kidney failure Maternal Grandmother    Congestive Heart Failure Maternal Grandmother    Colon cancer Maternal Grandmother 70   Diabetes Maternal Grandmother    Lung cancer Maternal Grandfather 22       smoker   Breast cancer Paternal Grandmother        dx. early 64s; w/ hx of trauma to breast   Crohn's disease Daughter    Lung cancer Maternal Uncle 37       smoker   No Known Allergies Prior to Admission medications   Medication Sig Start Date End Date Taking? Authorizing Provider  amitriptyline (ELAVIL) 100 MG tablet Take 0.5 tablets (50 mg total) by mouth at bedtime. 07/02/20  Yes Opalski, Neoma Laming, DO  cycloSPORINE (RESTASIS) 0.05 % ophthalmic emulsion Place 1 drop into both eyes 2 (two) times daily.   Yes [provider]  FLUoxetine (PROZAC) 40 MG capsule Take 40 mg by mouth daily.   Yes [provider]  metFORMIN (GLUCOPHAGE) 500 MG tablet 1 po with lunch and dinner daily 10/14/21  Yes Opalski, Deborah, DO  Semaglutide, 1 MG/DOSE, 4 MG/3ML SOPN Inject 1 mg as directed once a week. 10/02/21  Yes Opalski, Neoma Laming, DO  tamoxifen (NOLVADEX) 20 MG tablet Take 1 tablet (20 mg total) by mouth daily. 01/15/21  Yes Nicholas Lose, MD  topiramate (TOPAMAX) 50 MG tablet 1 po twice daily 10/14/21  Yes Opalski, Deborah, DO  vitamin B-12 (CYANOCOBALAMIN) 500 MCG tablet Take 1 tablet (500 mcg total) by mouth daily. 05/01/21  Yes Opalski, Neoma Laming, DO  Vitamin D, Ergocalciferol, (DRISDOL) 1.25 MG (50000 UNIT) CAPS capsule 1 po q wed  and 1 po q sun 10/14/21  Yes Opalski, Neoma Laming, DO  ALPRAZolam (XANAX) 0.25 MG tablet Take 0.25 mg by mouth daily as needed for anxiety. 07/20/20   [provider]     Positive ROS: All other systems have been reviewed and were otherwise negative with the exception of those mentioned in the HPI and as above.  Physical Exam: General: Alert, no acute distress Cardiovascular: No pedal edema Respiratory: No cyanosis, no  use of accessory musculature GI: abdomen soft Skin: No lesions in the area of chief complaint Neurologic: Sensation intact distally Psychiatric: Patient is competent for consent with normal mood and affect Lymphatic: no lymphedema  MUSCULOSKELETAL: exam stable  Assessment: right knee medial meniscal tear  Plan: Plan for Procedure(s): RIGHT KNEE ARTHROSCOPY WITH PARTIAL MEDIAL MENISECTOMY  The risks benefits and alternatives were discussed with the patient including but not limited to the risks of nonoperative treatment, versus surgical intervention including infection, bleeding, nerve injury,  blood clots, cardiopulmonary complications, morbidity, mortality, among others, and they were willing to proceed.   Preoperative templating of the joint replacement has been completed, documented, and submitted to the Operating Room personnel in order to optimize intra-operative equipment management.   Eduard Roux, MD 10/30/2021 10:05 AM

## 2021-10-30 NOTE — Anesthesia Preprocedure Evaluation (Addendum)
Anesthesia Evaluation  Patient identified by MRN, date of birth, ID band Patient awake    Reviewed: Allergy & Precautions, NPO status , Patient's Chart, lab work & pertinent test results  History of Anesthesia Complications (+) PONV  Airway Mallampati: I  TM Distance: >3 FB Neck ROM: Full    Dental  (+) Missing, Dental Advisory Given,    Pulmonary neg pulmonary ROS, former smoker,    Pulmonary exam normal breath sounds clear to auscultation       Cardiovascular negative cardio ROS Normal cardiovascular exam Rhythm:Regular Rate:Normal     Neuro/Psych PSYCHIATRIC DISORDERS Anxiety Depression negative neurological ROS     GI/Hepatic negative GI ROS, Neg liver ROS,   Endo/Other  diabetes  Renal/GU negative Renal ROS  negative genitourinary   Musculoskeletal  (+) Arthritis ,   Abdominal   Peds  Hematology   Anesthesia Other Findings   Reproductive/Obstetrics                            Anesthesia Physical Anesthesia Plan  ASA: 2  Anesthesia Plan: General   Post-op Pain Management: Tylenol PO (pre-op)* and Ketamine IV*   Induction: Intravenous  PONV Risk Score and Plan: 4 or greater and Ondansetron, Dexamethasone, Midazolam and Scopolamine patch - Pre-op  Airway Management Planned: LMA  Additional Equipment:   Intra-op Plan:   Post-operative Plan: Extubation in OR  Informed Consent: I have reviewed the patients History and Physical, chart, labs and discussed the procedure including the risks, benefits and alternatives for the proposed anesthesia with the patient or authorized representative who has indicated his/her understanding and acceptance.     Dental advisory given  Plan Discussed with: CRNA  Anesthesia Plan Comments:         Anesthesia Quick Evaluation

## 2021-10-30 NOTE — Op Note (Signed)
° °  Surgery Date: 10/30/2021  PREOPERATIVE DIAGNOSES:  1. Right knee medial meniscus tear 2. Right knee synovitis 3. Right knee chondromalacia  POSTOPERATIVE DIAGNOSES:  same  PROCEDURES PERFORMED:  1. Right knee arthroscopy with major synovectomy 2. Right knee arthroscopy with arthroscopic partial medial meniscectomy 3. Right knee arthroscopy with arthroscopic chondroplasty medial femoral condyle and lateral tibial plateau. Patella and trochlea.  SURGEON: N. Eduard Roux, M.D.  ASSIST: Ciro Backer [place], [place]; necessary for the timely completion of procedure and due to complexity of procedure.  ANESTHESIA:  general  FLUIDS: Per anesthesia record.   ESTIMATED BLOOD LOSS: minimal  * No implants in log *  DESCRIPTION OF PROCEDURE: Christina [name] is a 56 y.o.-year-old female with above mentioned conditions. Full discussion held regarding risks benefits alternatives and complications related surgical intervention. Conservative care options reviewed. All questions answered.  The patient was identified in the preoperative holding area and the operative extremity was marked. The patient was brought to the operating room and transferred to operating table in a supine position. Satisfactory general anesthesia was induced by anesthesiology.    Standard anterolateral, anteromedial arthroscopy portals were obtained. The anteromedial portal was obtained with a spinal needle for localization under direct visualization with subsequent diagnostic findings.   We first found moderate synovitis in the medial lateral and superior gutters.  Major synovectomy was performed using oscillating shaver.  We then placed a gentle valgus force on the knee to address the medial compartment.  There was grade 3 changes medial femoral condyle.  Chondroplasty was performed for unstable flaps of cartilage with a full-radius shaver.  We then found a complex tear of the posterior horn the medial meniscus.  Partial medial  meniscectomy was performed using a combination of meniscus basket and oscillating shaver back to stable margins.  We took approximately 30% of the volume in that region.  The cruciates were unremarkable.  The lateral meniscus was unremarkable.  There was central area of the lateral femoral condyle with grade 3 changes.  Chondroplasty was performed there.  We then placed knee in extension and found grade 4 changes of the femoral trochlea and grade 3 changes of the posterior surface of the patella.  Chondroplasty was performed there as well.  Gutters were checked for loose bodies.  Excess fluid was removed from the knee joint.  Incisions were closed with interrupted nylon sutures.  Sterile dressings were applied.  Patient tolerated procedure well had no immediate complications.  Suprapatellar pouch and gutters: moderate synovitis or debris. Patella chondral surface: Grade 3 Trochlear chondral surface: Grade 4 Patellofemoral tracking: Normal Medial meniscus: Complex tear posterior horn.  Medial femoral condyle weight bearing surface: Grade 3 Medial tibial plateau: Grade 3 Anterior cruciate ligament:stable Posterior cruciate ligament:stable Lateral meniscus: Normal.   Lateral femoral condyle weight bearing surface: Grade 3 Lateral tibial plateau: Grade 1  DISPOSITION: The patient was awakened from general anesthetic, extubated, taken to the recovery room in medically stable condition, no apparent complications. The patient may be weightbearing as tolerated to the operative lower extremity.  Range of motion of right knee as tolerated.  Christina Cecil, MD [facility] 12:38 PM

## 2021-10-31 ENCOUNTER — Encounter (HOSPITAL_BASED_OUTPATIENT_CLINIC_OR_DEPARTMENT_OTHER): Payer: Self-pay | Admitting: Orthopaedic Surgery

## 2021-10-31 ENCOUNTER — Telehealth: Payer: Self-pay | Admitting: Orthopaedic Surgery

## 2021-10-31 NOTE — Telephone Encounter (Signed)
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   CB [phone]

## 2021-10-31 NOTE — Telephone Encounter (Signed)
Yes she can.

## 2021-10-31 NOTE — Telephone Encounter (Signed)
Patient aware.

## 2021-11-04 ENCOUNTER — Ambulatory Visit (INDEPENDENT_AMBULATORY_CARE_PROVIDER_SITE_OTHER): Payer: 59 | Admitting: Family Medicine

## 2021-11-06 ENCOUNTER — Encounter: Payer: Self-pay | Admitting: Physician Assistant

## 2021-11-06 ENCOUNTER — Ambulatory Visit (INDEPENDENT_AMBULATORY_CARE_PROVIDER_SITE_OTHER): Payer: 59 | Admitting: Physician Assistant

## 2021-11-06 ENCOUNTER — Other Ambulatory Visit: Payer: Self-pay

## 2021-11-06 DIAGNOSIS — M25552 Pain in left hip: Secondary | ICD-10-CM

## 2021-11-06 DIAGNOSIS — Z9889 Other specified postprocedural states: Secondary | ICD-10-CM

## 2021-11-06 NOTE — Addendum Note (Signed)
Addended by: Precious Bard on: 11/06/2021 04:52 PM ? ? Modules accepted: Orders ? ?

## 2021-11-06 NOTE — Progress Notes (Signed)
Post-Op Visit Note   Patient: Christina [name]           Date of Birth: November 04, 1965           MRN: [id number] Visit Date: 11/06/2021 PCP: Maurice Small, MD   Assessment & Plan:  Chief Complaint:  Chief Complaint  Patient presents with   Right Knee - Routine Post Op   Visit Diagnoses:  1. S/P arthroscopy of right knee     Plan: Patient is a pleasant 56 year old female who presents today 1 week status post right knee arthroscopic debridement medial meniscus and chondroplasty, date of surgery 10/30/2021.  It was noted during operative intervention that she had grade 3 and 4 changes to the patellofemoral and medial compartments.  She is currently not feeling any better than she did prior to surgery.  She does mention that she has been at work standing since Friday after the surgery.  She is taking ibuprofen which does seem to help.  She denies any calf pain.  Examination of the right knee reveals a small effusion.  She has 2 fully healed surgical portals with nylon sutures in place.  She has mild peri-incisional erythema, but no evidence of infection or cellulitis.  Calf is soft nontender.  She is neurovascular tact distally.  Today, sutures were removed and Steri-Strips applied.  Intraoperative pictures reviewed.  Home exercise program provided.  I have provided her with a work note for seated work only for the next 5 weeks.  She will follow-up with [name] in 5 weeks time for recheck.  Call with concerns or questions in the meantime.  Follow-Up Instructions: Return in about 5 weeks (around 12/11/2021).   Orders:  No orders of the defined types were placed in this encounter.  No orders of the defined types were placed in this encounter.   Imaging: No new imaging  PMFS History: Patient Active Problem List   Diagnosis Date Noted   Acute medial meniscus tear, right, initial encounter 10/01/2021   At risk for dehydration 01/28/2021   At risk for malnutrition 01/01/2021   Obesity, Class I, BMI  30-34.9 12/31/2020   Vitamin B12 deficiency 10/08/2020   At risk for depression 10/08/2020   Mood disorder ([place]), with emotional eating 09/24/2020   At risk for impaired metabolic function 77/93/9030   At risk for diabetes mellitus 07/30/2020   Vitamin D deficiency 07/02/2020   Depression 07/02/2020   At risk for side effect of medication 07/02/2020   Stress due to illness of family member-  daughter with drug addiction 05/15/2020   Depression, recurrent ([place]) 05/15/2020   Anemia 05/15/2020   Constipation 05/15/2020   Prediabetes 05/15/2020   Breast asymmetry following reconstructive surgery 04/24/2020   Acquired absence of breast 04/24/2020   S/P breast reconstruction, bilateral 04/24/2020   Acute medial meniscus tear of left knee 12/20/2019   Breast cancer, left ([place]) 01/09/2016   Genetic testing 05/31/3006   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   Family history of breast cancer in female 11/08/2015   Family history of colon cancer 11/08/2015   Malignant neoplasm of central portion of left breast in female, estrogen receptor positive ([place]) 10/31/2015   Endometriosis 03/11/2011   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,  multifocal IDC, DCIS, ER/PR+,  01-09-2016 s/p bilteral mastectomies w/ left sln  bx;  no chemoradiation   Cataracts, bilateral    Depression    GAD (generalized anxiety disorder)    Gallbladder problem    History of ovarian cyst    IBS (irritable bowel syndrome)    Joint pain    PONV (postoperative nausea and vomiting)    does well with scop patch   Pre-diabetes    followed by pcp; on metformin for weight loss -Dr Mellody Dance   Retinal detachment    Rheg OS   Right knee meniscal tear    Urgency of urination     Family History  Problem Relation Age of Onset   Heart failure Father    Prostate cancer Father 28    Retinal detachment Father    Colon polyps Mother        approx 2   Other Mother        hx HPV and hysterectomy due to precancerous cells   Depression Mother    Anxiety disorder Mother    Other Sister 72       hx of hysterectomy for unspecified reason; still has ovaries   Other Sister 47       paternal half-sister hx of hysterectomy for unspecified reason; still has ovaries   Bladder Cancer Maternal Uncle 79       not a smoker   Kidney failure Maternal Grandmother    Congestive Heart Failure Maternal Grandmother    Colon cancer Maternal Grandmother 70   Diabetes Maternal Grandmother    Lung cancer Maternal Grandfather 71       smoker   Breast cancer Paternal Grandmother        dx. early 5s; w/ hx of trauma to breast   Crohn's disease Daughter    Lung cancer Maternal Uncle 37       smoker    Past Surgical History:  Procedure Laterality Date   ABDOMINAL HYSTERECTOMY  05/1996   endometriosis   ACHILLES TENDON REPAIR Right 2010;  revision 2011   BLADDER SUSPENSION  2000   BREAST BIOPSY Left 10/2015   BREAST IMPLANT REMOVAL Bilateral 08/12/2017   Procedure: REMOVAL BILATERAL BREAST IMPLANTS;  Surgeon: Wallace Going, DO;  Location: [place];  Service: Plastics;  Laterality: Bilateral;   BREAST RECONSTRUCTION WITH PLACEMENT OF TISSUE EXPANDER AND FLEX HD (ACELLULAR HYDRATED DERMIS) Bilateral 01/09/2016   BREAST RECONSTRUCTION WITH PLACEMENT OF TISSUE EXPANDER AND FLEX HD (ACELLULAR HYDRATED DERMIS) Bilateral 01/09/2016   Procedure: BREAST RECONSTRUCTION WITH PLACEMENT OF TISSUE EXPANDER AND FLEX HD (ACELLULAR HYDRATED DERMIS);  Surgeon: Loel Lofty Dillingham, DO;  Location: [place];  Service: Plastics;  Laterality: Bilateral;   BREAST RECONSTRUCTION WITH PLACEMENT OF TISSUE EXPANDER AND FLEX HD (ACELLULAR HYDRATED DERMIS) Bilateral 05/29/2016   Procedure: PLACEMENT OF BILATERAL TISSUE EXPANDER AND FLEX HD (ACELLULAR HYDRATED DERMIS);  Surgeon: Wallace Going, DO;   Location: [place];  Service: Plastics;  Laterality: Bilateral;   BREAST REDUCTION SURGERY Bilateral 11/26/2016   Procedure: BILATERAL BREAST CAPSULE CONTRACTURE RELASE;  Surgeon: Wallace Going, DO;  Location: [place];  Service: Plastics;  Laterality: Bilateral;   [place]; 1989; [place] Right x3   last one [facility]01-2019 [facility]   EYE SURGERY Left 10/01/2020   Pneumatic retinopexy for repair of rheg RD - Dr. Bernarda Caffey   EYE SURGERY Left 10/04/2020   PPV - Dr. Bernarda Caffey   FAT GRAFTING BILATERAL BREAST  08-09-2018  '@[facility]'   GAS INSERTION Left 10/04/2020   Procedure: INSERTION OF GAS;  Surgeon: Bernarda Caffey, MD;  Location: [place];  Service: Ophthalmology;  Laterality: Left;   GAS/FLUID EXCHANGE Left 10/04/2020   Procedure: GAS/FLUID EXCHANGE;  Surgeon: Bernarda Caffey, MD;  Location: [place];  Service: Ophthalmology;  Laterality: Left;   INCISION AND DRAINAGE OF WOUND Bilateral 02/11/2016   Procedure: IRRIGATION AND DEBRIDEMENT OF BILATERAL BREAST POCKET;  Surgeon: Wallace Going, DO;  Location: [place];  Service: Plastics;  Laterality: Bilateral;   KNEE ARTHROSCOPY WITH MEDIAL MENISECTOMY Right 12/20/2019   Procedure: KNEE ARTHROSCOPY WITH MEDIAL MENISECTOMY;  Surgeon: Marchia Bond, MD;  Location: [place];  Service: Orthopedics;  Laterality: Right;   KNEE ARTHROSCOPY WITH MEDIAL MENISECTOMY Right 10/30/2021   Procedure: RIGHT KNEE ARTHROSCOPY WITH PARTIAL MEDIAL MENISECTOMY SYNOVECTOMY;  Surgeon: Leandrew Koyanagi, MD;  Location: [place];  Service: Orthopedics;  Laterality: Right;   LAPAROSCOPIC APPENDECTOMY  04-07-2011   '@[facility]'    w/ Excision peritoneal lipoma and lysis adhesions   LAPAROSCOPIC CHOLECYSTECTOMY  ~ [place] Right 10/04/2020   Procedure: LASER RETINOPEXY WITH INDIRECT LASER OPTHALMOSCOPE, RIGHT EYE;  Surgeon: Bernarda Caffey, MD;  Location: [place];   Service: Ophthalmology;  Laterality: Right;   LIPOSUCTION WITH LIPOFILLING Bilateral 11/26/2016   Procedure: LIPOFILLING FOR SYMMETRY;  Surgeon: Wallace Going, DO;  Location: [place];  Service: Plastics;  Laterality: Bilateral;   LIPOSUCTION WITH LIPOFILLING Bilateral 01/21/2017   Procedure: BILATERAL BREAST  LIPOFILLING FOR ASYMMETRY;  Surgeon: Wallace Going, DO;  Location: [place];  Service: Plastics;  Laterality: Bilateral;   LIPOSUCTION WITH LIPOFILLING Bilateral 06/28/2020   Procedure: Lipofilling bilateral breasts for asymmetry;  Surgeon: Wallace Going, DO;  Location: [place];  Service: Plastics;  Laterality: Bilateral;  90 min   MASTECTOMY Bilateral 01/09/2016   NIPPLE SPARING MASTECTOMY/SENTINAL LYMPH NODE BIOPSY/RECONSTRUCTION/PLACEMENT OF TISSUE EXPANDER Bilateral 01/09/2016   Procedure: BILATERAL NIPPLE SPARING MASTECTOMY WITH LEFT SENTINAL LYMPH NODE BIOPSY ;  Surgeon: Stark Klein, MD;  Location: [place];  Service: General;  Laterality: Bilateral;   PHOTOCOAGULATION WITH LASER Left 10/04/2020   Procedure: PHOTOCOAGULATION WITH LASER;  Surgeon: Bernarda Caffey, MD;  Location: [place];  Service: Ophthalmology;  Laterality: Left;   REMOVAL OF BILATERAL TISSUE EXPANDERS WITH PLACEMENT OF BILATERAL BREAST IMPLANTS Bilateral 08/20/2016   Procedure: REMOVAL OF BILATERAL TISSUE EXPANDERS WITH PLACEMENT OF BILATERAL SILICONE IMPLANTS;  Surgeon: Wallace Going, DO;  Location: [place];  Service: Plastics;  Laterality: Bilateral;   REMOVAL OF BILATERAL TISSUE EXPANDERS WITH PLACEMENT OF BILATERAL BREAST IMPLANTS Bilateral 11/05/2017   Procedure: REMOVAL OF BILATERAL TISSUE EXPANDERS WITH PLACEMENT OF BILATERAL BREAST SILICONE IMPLANTS;  Surgeon: Wallace Going, DO;  Location: [place];  Service: Plastics;  Laterality: Bilateral;   REMOVAL OF TISSUE EXPANDER Bilateral 02/11/2016   Procedure:  REMOVAL OF BILATERAL TISSUE EXPANDERS AND FLEX HD REMOVAL;  Surgeon: Wallace Going, DO;  Location: [place];  Service: Plastics;  Laterality: Bilateral;   RETINAL DETACHMENT SURGERY Left 10/01/2020   Pneumatic retinopexy for repair of rheg RD - Dr. Bernarda Caffey   RETINAL DETACHMENT SURGERY Left 10/04/2020   PPV - Dr. Bernarda Caffey   SCLERAL BUCKLE Left 10/04/2020   Procedure: SCLERAL BUCKLE LEFT EYE;  Surgeon: Bernarda Caffey, MD;  Location: [place];  Service: Ophthalmology;  Laterality: Left;   TISSUE EXPANDER PLACEMENT Bilateral 08/12/2017  Procedure: PLACEMENT OF BILATERAL TISSUE EXPANDER;  Surgeon: Wallace Going, DO;  Location: [place];  Service: Plastics;  Laterality: Bilateral;   TUBAL LIGATION Bilateral 1991   VITRECTOMY 25 GAUGE WITH SCLERAL BUCKLE Left 10/04/2020   Procedure: 25 GAUGE PARS PLANA VITRECTOMY LEFT EYE ;  Surgeon: Bernarda Caffey, MD;  Location: [place];  Service: Ophthalmology;  Laterality: Left;   Social History   Occupational History   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

## 2021-11-28 ENCOUNTER — Ambulatory Visit: Payer: 59 | Admitting: Physical Medicine and Rehabilitation

## 2021-12-03 ENCOUNTER — Ambulatory Visit (INDEPENDENT_AMBULATORY_CARE_PROVIDER_SITE_OTHER): Payer: 59 | Admitting: Orthopaedic Surgery

## 2021-12-03 ENCOUNTER — Other Ambulatory Visit: Payer: Self-pay

## 2021-12-03 ENCOUNTER — Encounter: Payer: Self-pay | Admitting: Orthopaedic Surgery

## 2021-12-03 DIAGNOSIS — S83241A Other tear of medial meniscus, current injury, right knee, initial encounter: Secondary | ICD-10-CM

## 2021-12-03 DIAGNOSIS — Z9889 Other specified postprocedural states: Secondary | ICD-10-CM

## 2021-12-03 NOTE — Progress Notes (Signed)
? ?Post-Op Visit Note ?  ?Patient: Christina [name]           ?Date of Birth: 1966/03/21           ?MRN: [id number] ?Visit Date: 12/03/2021 ?PCP: Maurice Small, MD ? ? ?Assessment & Plan: ? ?Chief Complaint:  ?Chief Complaint  ?Patient presents with  ? Right Knee - Pain, Routine Post Op  ? ?Visit Diagnoses:  ?1. S/P arthroscopy of right knee   ?2. Acute medial meniscus tear, right, initial encounter   ? ? ?Plan: Nora is 5 weeks status post right knee arthroscopy partial medial meniscectomy.  She is experiencing popping and locking.  She feels swelling in the knee. ? ?Examination of the right knee shows moderate joint effusion.  No significant joint line tenderness. ? ?Based on findings impression is effusion of the right knee.  This is likely postsurgical inflammation.  Treatment options were reviewed and she would like to try RICE for now but she will return in a couple weeks to have it aspirated if she does not notice any improvement. ? ?Follow-Up Instructions: No follow-ups on file.  ? ?Orders:  ?No orders of the defined types were placed in this encounter. ? ?No orders of the defined types were placed in this encounter. ? ? ?Imaging: ?No results found. ? ?PMFS History: ?Patient Active Problem List  ? Diagnosis Date Noted  ? Acute medial meniscus tear, right, initial encounter 10/01/2021  ? At risk for dehydration 01/28/2021  ? At risk for malnutrition 01/01/2021  ? Obesity, Class I, BMI 30-34.9 12/31/2020  ? Vitamin B12 deficiency 10/08/2020  ? At risk for depression 10/08/2020  ? Mood disorder ([place]), with emotional eating 09/24/2020  ? At risk for impaired metabolic function 86/57/8469  ? At risk for diabetes mellitus 07/30/2020  ? Vitamin D deficiency 07/02/2020  ? Depression 07/02/2020  ? At risk for side effect of medication 07/02/2020  ? Stress due to illness of family member-  daughter with drug addiction 05/15/2020  ? Depression, recurrent ([place]) 05/15/2020  ? Anemia 05/15/2020  ? Constipation 05/15/2020  ?  Prediabetes 05/15/2020  ? Breast asymmetry following reconstructive surgery 04/24/2020  ? Acquired absence of breast 04/24/2020  ? S/P breast reconstruction, bilateral 04/24/2020  ? Acute medial meniscus tear of left knee 12/20/2019  ? Breast cancer, left ([place]) 01/09/2016  ? Genetic testing 12/31/2015  ? 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 Family history of breast cancer in female 11/08/2015  ? Family history of colon cancer 11/08/2015  ? Malignant neoplasm of central portion of left breast in female, estrogen receptor positive ([place]) 10/31/2015  ? Endometriosis 03/11/2011  ? ?Past Medical History:  ?Diagnosis Date  ? Anemia   ? Arthritis   ? hands and knees  ? 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, left oncologist--- dr Lindi Adie  ? dx 02/ 2017,  multifocal IDC, DCIS, ER/PR+,  01-09-2016 s/p bilteral mastectomies w/ left sln bx;  no chemoradiation  ? Cataracts, bilateral   ? Depression   ? GAD (generalized anxiety disorder)   ? Gallbladder problem   ? History of ovarian cyst   ? IBS (irritable bowel syndrome)   ? Joint pain   ? PONV (postoperative nausea and vomiting)   ? does well with scop patch  ? Pre-diabetes   ? followed by pcp; on metformin for weight loss -Dr Mellody Dance  ? Retinal detachment   ? Rheg OS  ? Right knee meniscal tear   ? Urgency  of urination   ?  ?Family History  ?Problem Relation Age of Onset  ? Heart failure Father   ? Prostate cancer Father 25  ? Retinal detachment Father   ? Colon polyps Mother   ?     approx 2  ? Other Mother   ?     hx HPV and hysterectomy due to precancerous cells  ? Depression Mother   ? Anxiety disorder Mother   ? Other Sister 80  ?     hx of hysterectomy for unspecified reason; still has ovaries  ? Other Sister 43  ?     paternal half-sister hx of hysterectomy for unspecified reason; still has ovaries  ? Bladder Cancer Maternal Uncle 79  ?     not a smoker  ? Kidney failure Maternal Grandmother   ? Congestive Heart Failure Maternal Grandmother   ?  Colon cancer Maternal Grandmother 17  ? Diabetes Maternal Grandmother   ? Lung cancer Maternal Grandfather 75  ?     smoker  ? Breast cancer Paternal Grandmother   ?     dx. early 69s; w/ hx of trauma to breast  ? Crohn's disease Daughter   ? Lung cancer Maternal Uncle 37  ?     smoker  ?  ?Past Surgical History:  ?Procedure Laterality Date  ? ABDOMINAL HYSTERECTOMY  05/1996  ? endometriosis  ? ACHILLES TENDON REPAIR Right 2010;  revision 2011  ? BLADDER SUSPENSION  2000  ? BREAST BIOPSY Left 10/2015  ? BREAST IMPLANT REMOVAL Bilateral 08/12/2017  ? Procedure: REMOVAL BILATERAL BREAST IMPLANTS;  Surgeon: Wallace Going, DO;  Location: [place];  Service: Plastics;  Laterality: Bilateral;  ? BREAST RECONSTRUCTION WITH PLACEMENT OF TISSUE EXPANDER AND FLEX HD (ACELLULAR HYDRATED DERMIS) Bilateral 01/09/2016  ? BREAST RECONSTRUCTION WITH PLACEMENT OF TISSUE EXPANDER AND FLEX HD (ACELLULAR HYDRATED DERMIS) Bilateral 01/09/2016  ? Procedure: BREAST RECONSTRUCTION WITH PLACEMENT OF TISSUE EXPANDER AND FLEX HD (ACELLULAR HYDRATED DERMIS);  Surgeon: Loel Lofty Dillingham, DO;  Location: [place];  Service: Plastics;  Laterality: Bilateral;  ? BREAST RECONSTRUCTION WITH PLACEMENT OF TISSUE EXPANDER AND FLEX HD (ACELLULAR HYDRATED DERMIS) Bilateral 05/29/2016  ? Procedure: PLACEMENT OF BILATERAL TISSUE EXPANDER AND FLEX HD (ACELLULAR HYDRATED DERMIS);  Surgeon: Wallace Going, DO;  Location: [place];  Service: Plastics;  Laterality: Bilateral;  ? BREAST REDUCTION SURGERY Bilateral 11/26/2016  ? Procedure: BILATERAL BREAST CAPSULE CONTRACTURE RELASE;  Surgeon: Wallace Going, DO;  Location: [place];  Service: Plastics;  Laterality: Bilateral;  ? CESAREAN SECTION  1987; 1989; 1991  ? ELBOW SURGERY Right x3   last one [facility]-01-2019 @ [facility]  ? EYE SURGERY Left 10/01/2020  ? Pneumatic retinopexy for repair of rheg RD - Dr. Bernarda Caffey  ? EYE SURGERY Left 10/04/2020  ? PPV - Dr.  Bernarda Caffey  ? FAT GRAFTING BILATERAL BREAST  08-09-2018  '@[facility]'   ? GAS INSERTION Left 10/04/2020  ? Procedure: INSERTION OF GAS;  Surgeon: Bernarda Caffey, MD;  Location: [place];  Service: Ophthalmology;  Laterality: Left;  ? GAS/FLUID EXCHANGE Left 10/04/2020  ? Procedure: GAS/FLUID EXCHANGE;  Surgeon: Bernarda Caffey, MD;  Location: [place];  Service: Ophthalmology;  Laterality: Left;  ? INCISION AND DRAINAGE OF WOUND Bilateral 02/11/2016  ? Procedure: IRRIGATION AND DEBRIDEMENT OF BILATERAL BREAST POCKET;  Surgeon: Wallace Going, DO;  Location: [place];  Service: Plastics;  Laterality: Bilateral;  ? KNEE ARTHROSCOPY WITH MEDIAL  MENISECTOMY Right 12/20/2019  ? Procedure: KNEE ARTHROSCOPY WITH MEDIAL MENISECTOMY;  Surgeon: Marchia Bond, MD;  Location: [facility];  Service: Orthopedics;  Laterality: Right;  ? KNEE ARTHROSCOPY WITH MEDIAL MENISECTOMY Right 10/30/2021  ? Procedure: RIGHT KNEE ARTHROSCOPY WITH PARTIAL MEDIAL MENISECTOMY SYNOVECTOMY;  Surgeon: Leandrew Koyanagi, MD;  Location: [place];  Service: Orthopedics;  Laterality: Right;  ? LAPAROSCOPIC APPENDECTOMY  04-07-2011   '@[facility]'   ? w/ Excision peritoneal lipoma and lysis adhesions  ? LAPAROSCOPIC CHOLECYSTECTOMY  ~ 1999  ? LASER PHOTO ABLATION Right 10/04/2020  ? Procedure: LASER RETINOPEXY WITH INDIRECT LASER OPTHALMOSCOPE, RIGHT EYE;  Surgeon: Bernarda Caffey, MD;  Location: [place];  Service: Ophthalmology;  Laterality: Right;  ? LIPOSUCTION WITH LIPOFILLING Bilateral 11/26/2016  ? Procedure: LIPOFILLING FOR SYMMETRY;  Surgeon: Wallace Going, DO;  Location: [place];  Service: Plastics;  Laterality: Bilateral;  ? LIPOSUCTION WITH LIPOFILLING Bilateral 01/21/2017  ? Procedure: BILATERAL BREAST  LIPOFILLING FOR ASYMMETRY;  Surgeon: Wallace Going, DO;  Location: [place];  Service: Plastics;  Laterality: Bilateral;  ? LIPOSUCTION WITH LIPOFILLING Bilateral 06/28/2020  ?  Procedure: Lipofilling bilateral breasts for asymmetry;  Surgeon: Wallace Going, DO;  Location: [place];  Service: Plastics;  Laterality: Bilateral;  90 min  ? MASTECTOMY Bilateral 5

## 2021-12-10 ENCOUNTER — Ambulatory Visit: Payer: 59 | Admitting: Orthopaedic Surgery

## 2021-12-12 ENCOUNTER — Ambulatory Visit: Payer: 59 | Admitting: Orthopaedic Surgery

## 2021-12-23 ENCOUNTER — Encounter (INDEPENDENT_AMBULATORY_CARE_PROVIDER_SITE_OTHER): Payer: Self-pay | Admitting: Family Medicine

## 2021-12-23 ENCOUNTER — Ambulatory Visit (INDEPENDENT_AMBULATORY_CARE_PROVIDER_SITE_OTHER): Payer: 59 | Admitting: Family Medicine

## 2021-12-23 VITALS — BP 117/76 | HR 78 | Temp 98.1°F | Ht 67.0 in | Wt 215.0 lb

## 2021-12-23 DIAGNOSIS — F329 Major depressive disorder, single episode, unspecified: Secondary | ICD-10-CM

## 2021-12-23 DIAGNOSIS — F509 Eating disorder, unspecified: Secondary | ICD-10-CM | POA: Diagnosis not present

## 2021-12-23 DIAGNOSIS — Z9189 Other specified personal risk factors, not elsewhere classified: Secondary | ICD-10-CM

## 2021-12-23 DIAGNOSIS — R7303 Prediabetes: Secondary | ICD-10-CM | POA: Diagnosis not present

## 2021-12-23 DIAGNOSIS — Z6833 Body mass index (BMI) 33.0-33.9, adult: Secondary | ICD-10-CM

## 2021-12-23 DIAGNOSIS — E669 Obesity, unspecified: Secondary | ICD-10-CM | POA: Diagnosis not present

## 2021-12-23 MED ORDER — METFORMIN HCL 500 MG PO TABS: ORAL_TABLET | ORAL | 0 refills | Status: DC

## 2021-12-23 MED ORDER — TOPIRAMATE 50 MG PO TABS: ORAL_TABLET | ORAL | 0 refills | Status: DC

## 2021-12-25 ENCOUNTER — Ambulatory Visit: Payer: Self-pay

## 2021-12-25 ENCOUNTER — Ambulatory Visit (INDEPENDENT_AMBULATORY_CARE_PROVIDER_SITE_OTHER): Payer: 59 | Admitting: Physical Medicine and Rehabilitation

## 2021-12-25 ENCOUNTER — Encounter: Payer: Self-pay | Admitting: Physical Medicine and Rehabilitation

## 2021-12-25 DIAGNOSIS — M25552 Pain in left hip: Secondary | ICD-10-CM | POA: Diagnosis not present

## 2021-12-25 NOTE — Progress Notes (Signed)
Pt state left hip pain. Pt state walking, standing and bending makes the pain worse. Pt state she takes over the counter pain meds and uses heat and ice to help ease her pain. ? ?Numeric Pain Rating Scale and Functional Assessment ?Average Pain 2 ? ? ?In the last MONTH (on 0-10 scale) has pain interfered with the following? ? ?1. General activity like being  able to carry out your everyday physical activities such as walking, climbing stairs, carrying groceries, or moving a chair?  ?Rating(9) ? ? ? -BT, -Dye Allergies. ? ?

## 2021-12-26 ENCOUNTER — Ambulatory Visit (HOSPITAL_COMMUNITY)
Admission: RE | Admit: 2021-12-26 | Discharge: 2021-12-26 | Disposition: A | Discharge status: Home / Self Care | Payer: 59 | Source: Ambulatory Visit | Attending: Orthopaedic Surgery | Admitting: Orthopaedic Surgery

## 2021-12-26 ENCOUNTER — Encounter: Payer: Self-pay | Admitting: Orthopaedic Surgery

## 2021-12-26 ENCOUNTER — Ambulatory Visit (INDEPENDENT_AMBULATORY_CARE_PROVIDER_SITE_OTHER): Payer: 59 | Admitting: Orthopaedic Surgery

## 2021-12-26 DIAGNOSIS — G8929 Other chronic pain: Secondary | ICD-10-CM

## 2021-12-26 DIAGNOSIS — M25561 Pain in right knee: Secondary | ICD-10-CM | POA: Insufficient documentation

## 2021-12-26 MED ORDER — METHYLPREDNISOLONE ACETATE 40 MG/ML IJ SUSP
40.0000 mg | INTRAMUSCULAR | Status: AC | PRN
  Administered 2021-12-26: 40 mg via INTRA_ARTICULAR

## 2021-12-26 MED ORDER — BUPIVACAINE HCL 0.25 % IJ SOLN
2.0000 mL | INTRAMUSCULAR | Status: AC | PRN
  Administered 2021-12-26: 2 mL via INTRA_ARTICULAR

## 2021-12-26 MED ORDER — LIDOCAINE HCL 1 % IJ SOLN
2.0000 mL | INTRAMUSCULAR | Status: AC | PRN
  Administered 2021-12-26: 2 mL

## 2021-12-26 NOTE — Progress Notes (Signed)
? ?  Procedure Note ? ?Patient: Christina [name]             ?Date of Birth: [birth year]           ?MRN: [id number]             ?Visit Date: 12/26/2021 ? ?Procedures: ?Visit Diagnoses:  ?1. Chronic pain of right knee   ? ? ?Large Joint Inj: R knee on 12/26/2021 8:57 AM ?Indications: pain ?Details: 22 G needle, anterolateral approach ?Medications: 2 mL lidocaine 1 %; 2 mL bupivacaine 0.25 %; 40 mg methylPREDNISolone acetate 40 MG/ML ? ? ? ? ? ?

## 2021-12-26 NOTE — Progress Notes (Signed)
? ?Post-Op Visit Note ?  ?Patient: Christina [name]           ?Date of Birth: 1965-10-12           ?MRN: [id number] ?Visit Date: 12/26/2021 ?PCP: Maurice Small, MD ? ? ?Assessment & Plan: ? ?Chief Complaint:  ?Chief Complaint  ?Patient presents with  ? Right Knee - Follow-up  ?  Right knee arthroscopy 10/30/2021  ? ?Visit Diagnoses:  ?1. Chronic pain of right knee   ? ? ?Plan: Patient is a pleasant 56 year old female who comes in today 6 weeks status post right knee arthroscopic debridement medial meniscectomy and chondroplasty 10/30/2021.  It was noted during operative intervention she had grade 3 and 4 changes throughout.  She continues to endorse pain but mostly swelling to the right knee.  Over the past week she has noticed increased swelling to the right lower leg.  She feels as though she is been working out but has not.  She is a non-smoker.  She is not on birth control or anticoagulants.  No personal or family history of DVT or PE.  No chest pain or shortness of breath.  Examination of her right knee reveals a small to moderate effusion.  Range of motion 0 to 95 degrees.  Calf is moderately swollen and minimally tender.  No erythema or warmth.  She does have a positive Homans.  She is neurovascularly intact distally.  Today, right knee was aspirated and injected with cortisone.  We have discussed that the knee effusion is likely from the underlying arthritis and will hopefully improve somewhat with time and aspiration/injection.  I will also order a right lower extremity venous Doppler ultrasound to rule out DVT.  She will follow-up with [name] as needed. ? ?Follow-Up Instructions: Return if symptoms worsen or fail to improve.  ? ?Orders:  ?No orders of the defined types were placed in this encounter. ? ?No orders of the defined types were placed in this encounter. ? ? ?Imaging: ?XR C-ARM NO REPORT ? ?Result Date: 12/25/2021 ?Please see Notes tab for imaging impression.  ? ?PMFS History: ?Patient Active Problem List  ?  Diagnosis Date Noted  ? Acute medial meniscus tear, right, initial encounter 10/01/2021  ? At risk for dehydration 01/28/2021  ? At risk for malnutrition 01/01/2021  ? Obesity, Class I, BMI 30-34.9 12/31/2020  ? Vitamin B12 deficiency 10/08/2020  ? At risk for depression 10/08/2020  ? Mood disorder ([place]), with emotional eating 09/24/2020  ? At risk for impaired metabolic function 38/06/1750  ? At risk for diabetes mellitus 07/30/2020  ? Vitamin D deficiency 07/02/2020  ? Depression 07/02/2020  ? At risk for side effect of medication 07/02/2020  ? Stress due to illness of family member-  daughter with drug addiction 05/15/2020  ? Depression, recurrent ([place]) 05/15/2020  ? Anemia 05/15/2020  ? Constipation 05/15/2020  ? Prediabetes 05/15/2020  ? Breast asymmetry following reconstructive surgery 04/24/2020  ? Acquired absence of breast 04/24/2020  ? S/P breast reconstruction, bilateral 04/24/2020  ? Acute medial meniscus tear of left knee 12/20/2019  ? Breast cancer, left ([place]) 01/09/2016  ? Genetic testing 12/31/2015  ? 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 Family history of breast cancer in female 11/08/2015  ? Family history of colon cancer 11/08/2015  ? Malignant neoplasm of central portion of left breast in female, estrogen receptor positive ([place]) 10/31/2015  ? Endometriosis 03/11/2011  ? ?Past Medical History:  ?Diagnosis Date  ? Anemia   ?  Arthritis   ? hands and knees  ? 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, left oncologist--- dr Lindi Adie  ? dx 02/ 2017,  multifocal IDC, DCIS, ER/PR+,  01-09-2016 s/p bilteral mastectomies w/ left sln bx;  no chemoradiation  ? Cataracts, bilateral   ? Depression   ? GAD (generalized anxiety disorder)   ? Gallbladder problem   ? History of ovarian cyst   ? IBS (irritable bowel syndrome)   ? Joint pain   ? PONV (postoperative nausea and vomiting)   ? does well with scop patch  ? Pre-diabetes   ? followed by pcp; on metformin for weight loss -Dr Mellody Dance  ?  Retinal detachment   ? Rheg OS  ? Right knee meniscal tear   ? Urgency of urination   ?  ?Family History  ?Problem Relation Age of Onset  ? Heart failure Father   ? Prostate cancer Father 90  ? Retinal detachment Father   ? Colon polyps Mother   ?     approx 2  ? Other Mother   ?     hx HPV and hysterectomy due to precancerous cells  ? Depression Mother   ? Anxiety disorder Mother   ? Other Sister 50  ?     hx of hysterectomy for unspecified reason; still has ovaries  ? Other Sister 47  ?     paternal half-sister hx of hysterectomy for unspecified reason; still has ovaries  ? Bladder Cancer Maternal Uncle 79  ?     not a smoker  ? Kidney failure Maternal Grandmother   ? Congestive Heart Failure Maternal Grandmother   ? Colon cancer Maternal Grandmother 76  ? Diabetes Maternal Grandmother   ? Lung cancer Maternal Grandfather 79  ?     smoker  ? Breast cancer Paternal Grandmother   ?     dx. early 35s; w/ hx of trauma to breast  ? Crohn's disease Daughter   ? Lung cancer Maternal Uncle 37  ?     smoker  ?  ?Past Surgical History:  ?Procedure Laterality Date  ? ABDOMINAL HYSTERECTOMY  05/1996  ? endometriosis  ? ACHILLES TENDON REPAIR Right 2010;  revision 2011  ? BLADDER SUSPENSION  2000  ? BREAST BIOPSY Left 10/2015  ? BREAST IMPLANT REMOVAL Bilateral 08/12/2017  ? Procedure: REMOVAL BILATERAL BREAST IMPLANTS;  Surgeon: Wallace Going, DO;  Location: [place];  Service: Plastics;  Laterality: Bilateral;  ? BREAST RECONSTRUCTION WITH PLACEMENT OF TISSUE EXPANDER AND FLEX HD (ACELLULAR HYDRATED DERMIS) Bilateral 01/09/2016  ? BREAST RECONSTRUCTION WITH PLACEMENT OF TISSUE EXPANDER AND FLEX HD (ACELLULAR HYDRATED DERMIS) Bilateral 01/09/2016  ? Procedure: BREAST RECONSTRUCTION WITH PLACEMENT OF TISSUE EXPANDER AND FLEX HD (ACELLULAR HYDRATED DERMIS);  Surgeon: Loel Lofty Dillingham, DO;  Location: [place];  Service: Plastics;  Laterality: Bilateral;  ? BREAST RECONSTRUCTION WITH PLACEMENT OF TISSUE EXPANDER  AND FLEX HD (ACELLULAR HYDRATED DERMIS) Bilateral 05/29/2016  ? Procedure: PLACEMENT OF BILATERAL TISSUE EXPANDER AND FLEX HD (ACELLULAR HYDRATED DERMIS);  Surgeon: Wallace Going, DO;  Location: [place];  Service: Plastics;  Laterality: Bilateral;  ? BREAST REDUCTION SURGERY Bilateral 11/26/2016  ? Procedure: BILATERAL BREAST CAPSULE CONTRACTURE RELASE;  Surgeon: Wallace Going, DO;  Location: [place];  Service: Plastics;  Laterality: Bilateral;  ? CESAREAN SECTION  1987; 1989; 1991  ? ELBOW SURGERY Right x3   last one 02-01-2019 @ [facility]  ? EYE SURGERY Left  10/01/2020  ? Pneumatic retinopexy for repair of rheg RD - Dr. Bernarda Caffey  ? EYE SURGERY Left 10/04/2020  ? PPV - Dr. Bernarda Caffey  ? FAT GRAFTING BILATERAL BREAST  08-09-2018  _0   ? GAS INSERTION Left 10/04/2020  ? Procedure: INSERTION OF GAS;  Surgeon: Bernarda Caffey, MD;  Location: [place];  Service: Ophthalmology;  Laterality: Left;  ? GAS/FLUID EXCHANGE Left 10/04/2020  ? Procedure: GAS/FLUID EXCHANGE;  Surgeon: Bernarda Caffey, MD;  Location: [place];  Service: Ophthalmology;  Laterality: Left;  ? INCISION AND DRAINAGE OF WOUND Bilateral 02/11/2016  ? Procedure: IRRIGATION AND DEBRIDEMENT OF BILATERAL BREAST POCKET;  Surgeon: Wallace Going, DO;  Location: [place];  Service: Plastics;  Laterality: Bilateral;  ? KNEE ARTHROSCOPY WITH MEDIAL MENISECTOMY Right 12/20/2019  ? Procedure: KNEE ARTHROSCOPY WITH MEDIAL MENISECTOMY;  Surgeon: Marchia Bond, MD;  Location: [facility];  Service: Orthopedics;  Laterality: Right;  ? KNEE ARTHROSCOPY WITH MEDIAL MENISECTOMY Right 10/30/2021  ? Procedure: RIGHT KNEE ARTHROSCOPY WITH PARTIAL MEDIAL MENISECTOMY SYNOVECTOMY;  Surgeon: Leandrew Koyanagi, MD;  Location: [place];  Service: Orthopedics;  Laterality: Right;  ? LAPAROSCOPIC APPENDECTOMY  04-07-2011   _1   ? w/ Excision peritoneal lipoma and lysis adhesions  ? LAPAROSCOPIC  CHOLECYSTECTOMY  ~ 1999  ? LASER PHOTO ABLATION Right 10/04/2020  ? Procedure: LASER RETINOPEXY WITH INDIRECT LASER OPTHALMOSCOPE, RIGHT EYE;  Surgeon: Bernarda Caffey, MD;  Location: [place];  Service: Berenice Primas

## 2021-12-26 NOTE — Progress Notes (Signed)
RLE venous duplex has been completed.  Attempted to call with preliminary results but there was no answer. ? ? ?Results can be found under chart review under CV PROC. ?12/26/2021 10:14 AM ?Javaria Knapke RVT, RDMS ? ?

## 2022-01-01 MED ORDER — TRIAMCINOLONE ACETONIDE 40 MG/ML IJ SUSP
60.0000 mg | INTRAMUSCULAR | Status: AC | PRN
  Administered 2021-12-25: 60 mg via INTRA_ARTICULAR

## 2022-01-01 MED ORDER — BUPIVACAINE HCL 0.25 % IJ SOLN
4.0000 mL | INTRAMUSCULAR | Status: AC | PRN
  Administered 2021-12-25: 4 mL via INTRA_ARTICULAR

## 2022-01-01 NOTE — Progress Notes (Signed)
? ?  Christina [name] - 56 y.o. female MRN [id number]  Date of birth: 24-Nov-1965 ? ?Office Visit Note: ?Visit Date: 12/25/2021 ?PCP: Maurice Small, MD ?Referred by: Maurice Small, MD ? ?Subjective: ?Chief Complaint  ?Patient presents with  ? Left Hip - Pain  ? ?HPI:  Christina [name] is a 55 y.o. female who comes in today at the request of Tawanna Cooler, PA-C for planned Left anesthetic hip arthrogram with fluoroscopic guidance.  The patient has failed conservative care including home exercise, medications, time and activity modification.  This injection will be diagnostic and hopefully therapeutic.  Please see requesting physician notes for further details and justification. ? ?ROS Otherwise per HPI. ? ?Assessment & Plan: ?Visit Diagnoses:  ?  ICD-10-CM   ?1. Pain in left hip  M25.552 XR C-ARM NO REPORT  ?  ?  ?Plan: No additional findings.  ? ?Meds & Orders: No orders of the defined types were placed in this encounter. ?  ?Orders Placed This Encounter  ?Procedures  ? Large Joint Inj  ? XR C-ARM NO REPORT  ?  ?Follow-up: No follow-ups on file.  ? ?Procedures: ?Large Joint Inj: L hip joint on 12/25/2021 1:30 PM ?Indications: diagnostic evaluation and pain ?Details: 22 G 3.5 in needle, fluoroscopy-guided anterior approach ? ?Arthrogram: No ? ?Medications: 4 mL bupivacaine 0.25 %; 60 mg triamcinolone acetonide 40 MG/ML ?Outcome: tolerated well, no immediate complications ? ?There was excellent flow of contrast producing a partial arthrogram of the hip. The patient did have relief of symptoms during the anesthetic phase of the injection. ?Procedure, treatment alternatives, risks and benefits explained, specific risks discussed. Consent was given by the patient. Immediately prior to procedure a time out was called to verify the correct patient, procedure, equipment, support staff and site/side marked as required. Patient was prepped and draped in the usual sterile fashion.  ? ?  ?   ? ?Clinical History: ?No specialty comments  available.  ? ? ? ?Objective:  VS:  HT:    WT:   BMI:     BP:   HR: bpm  TEMP: ( )  RESP:  ?Physical Exam  ? ?Imaging: ?No results found. ?

## 2022-01-01 NOTE — Progress Notes (Incomplete)
? ?Patient Care Team: ?Maurice Small, MD as PCP - General (Family Medicine) ?Megan Salon, MD as Consulting Physician (Gynecology) ?Sylvan Cheese, NP as Nurse Practitioner (Hematology and Oncology) ? ?DIAGNOSIS: No diagnosis found. ? ?SUMMARY OF ONCOLOGIC HISTORY: ?Oncology History  ?Malignant neoplasm of central portion of left breast in female, estrogen receptor positive ([place])  ?10/29/2015 Mammogram  ? Screening detected left breast calcifications of irregular 11 mm biopsy DCIS high-grade, central calcifications biopsy was fibrocystic change ? ?  ?10/29/2015 Initial Diagnosis  ? Left breast biopsy subareolar: DCIS with calcifications, ER 95%, PR 90%, high-grade ? ?  ?11/19/2015 Procedure  ? Genetic testing: ATM gene mutation, "c.3154-2A>G (IVS21-2A>G)."   Genes analyzed:ATM, BARD1, BRCA1, BRCA2, BRIP1, CDH1, CHEK2, EPCAM, FANCC, MLH1, MSH2, MSH6, NBN, PALB2, PMS2, PTEN, RAD51C, RAD51D, TP53, and XRCC2  ?  ?01/09/2016 Surgery  ? Bilateral mastectomy with (L) SLNB ([place]): LEFT mastectomy: 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 p(m)T1b, pN0: Stage IA; RIGHT mastectomy: PASH  ?  ?01/09/2016 Oncotype testing  ? Oncotype score: 5; 5% ROR ? ?  ?02/2016 -  Anti-estrogen oral therapy  ? Tamoxifen 20 mg daily.  ? ?  ? ? ?CHIEF COMPLIANT: Follow-up of breast cancer on tamoxifen  ? ?INTERVAL HISTORY: Christina [name] is a  ?56 y.o. with above-mentioned history of breast cancer treated with bilateral mastectomies followed by tamoxifen therapy. She presents to the clinic today for a follow-up.  ? ?ALLERGIES:  has No Known Allergies. ? ?MEDICATIONS:  ?Current Outpatient Medications  ?Medication Sig Dispense Refill  ? ALPRAZolam (XANAX) 0.25 MG tablet Take 0.25 mg by mouth daily as needed for anxiety.    ? amitriptyline (ELAVIL) 100 MG tablet Take 0.5 tablets (50 mg total) by mouth at bedtime.    ?  cycloSPORINE (RESTASIS) 0.05 % ophthalmic emulsion Place 1 drop into both eyes 2 (two) times daily.    ? HYDROcodone-acetaminophen (NORCO) 5-325 MG tablet Take 1 tablet by mouth every 6 (six) hours as needed. 20 tablet 0  ? metFORMIN (GLUCOPHAGE) 500 MG tablet 1 po with lunch and dinner daily 180 tablet 0  ? Semaglutide, 1 MG/DOSE, 4 MG/3ML SOPN Inject 1 mg as directed once a week. 9 mL 0  ? tamoxifen (NOLVADEX) 20 MG tablet Take 1 tablet (20 mg total) by mouth daily. 90 tablet 3  ? topiramate (TOPAMAX) 50 MG tablet 1 po twice daily 180 tablet 0  ? vitamin B-12 (CYANOCOBALAMIN) 500 MCG tablet Take 1 tablet (500 mcg total) by mouth daily.    ? Vitamin D, Ergocalciferol, (DRISDOL) 1.25 MG (50000 UNIT) CAPS capsule 1 po q wed and 1 po q sun 8 capsule 0  ? ?No current facility-administered medications for this visit.  ? ? ?PHYSICAL EXAMINATION: ?ECOG PERFORMANCE STATUS: {CHL ONC ECOG EY:[id number]} ? ?There were no vitals filed for this visit. ?There were no vitals filed for this visit. ? ?BREAST:*** No palpable masses or nodules in either right or left breasts. No palpable axillary supraclavicular or infraclavicular adenopathy no breast tenderness or nipple discharge. (exam performed in the presence of a chaperone) ? ?LABORATORY DATA:  ?I have reviewed the data as listed ? ?  Latest Ref Rng & Units 10/28/2021  ?  3:13 PM 09/19/2021  ?  7:33 AM 04/10/2021  ?  9:26 AM  ?CMP  ?Glucose 70 - 99 mg/dL 88  85   87    ?BUN 6 - 20 mg/dL '15   15   15    ' ?Creatinine 0.44 - 1.00 mg/dL 0.97   0.87   0.73    ?Sodium 135 - 145 mmol/L 142   141   140    ?Potassium 3.5 - 5.1 mmol/L 4.6   4.2   4.9    ?Chloride 98 - 111 mmol/L 109   107   108    ?CO2 22 - 32 mmol/L '27   22   22    ' ?Calcium 8.9 - 10.3 mg/dL 10.3   9.8   10.3    ?Total Protein 6.0 - 8.5 g/dL  6.1   6.6    ?Total Bilirubin 0.0 - 1.2 mg/dL  <0.2   <0.2    ?Alkaline Phos 44 - 121 IU/L  61   75    ?AST 0 - 40 IU/L  15   14    ?ALT 0 - 32 IU/L  26   21    ? ? ?Lab Results   ?Component Value Date  ? WBC 7.4 04/10/2021  ? HGB 13.5 04/10/2021  ? HCT 43.1 04/10/2021  ? MCV 88 04/10/2021  ? PLT 305 04/10/2021  ? NEUTROABS 4.4 04/10/2021  ? ? ?ASSESSMENT & PLAN:  ?No problem-specific Assessment & Plan notes found for this encounter. ? ? ? ?No orders of the defined types were placed in this encounter. ? ?The patient has a good understanding of the overall plan. she agrees with it. she will call with any problems that may develop before the next visit here. ?Total time spent: 30 mins including face to face time and time spent for planning, charting and co-ordination of care ? ? Suzzette Righter, CMA ?01/01/22 ? ? ? I Gardiner Coins am scribing for Dr. Lindi Adie ? ?***  ?

## 2022-01-03 NOTE — Progress Notes (Signed)
? ? ? ?Chief Complaint:  ? ?OBESITY ?Christina [name] is here to discuss her progress with her obesity treatment plan along with follow-up of her obesity related diagnoses. Christina [name] is on the Category 3 Plan and states she is following her eating plan approximately 0% of the time. Christina [name] states she is not currently exercising. ? ?Today's visit was #: 73 ?Starting weight: 218 lbs ?Starting date: 05/15/2020 ?Today's weight: 215 lbs ?Today's date: 12/23/2021 ?Total lbs lost to date: 3 ?Total lbs lost since last in-office visit: 0 ? ?Interim History: Christina [name] started going to the [facility], and she is seeing a counselor and psych doctor there. She has been on various medications and they are trying to find the right one. Patient thinks her mood medications are contributing to weight gain, but not on medications or meal plan.  ? ?Subjective:  ? ?1. Pre-diabetes ?Christina [name] ran out of her metformin 3-4 weeks ago an lost to follow. She is taking metformin only. She notes increased hunger and cravings, but she thinks her psych medications contributed to this. Insurance wont cover weight loss medications (Ozempic or Arbutus, or others).  ? ?2. Eating disorder- emotional eating ?Christina [name] hasn't asked her psych doctor about continuing Topamax. She knew the patient was on it and didn't oppose it. Patient hsn't had any headache's since starting it.  ? ?3. Major depressive disorder, remission status unspecified, unspecified whether recurrent ?Christina [name]'s psych doctor at [facility] had her on Trintellix for 1 week only, then Lexapro and Rexulti together. She thinks now, not sure of doses. She denies suicidal ideations. Her mood is stable now.  ? ?4. At risk for side effect of medication ?Christina [name] is at risk for drug side effects since being off her medications and restarting them. ? ?Assessment/Plan:  ?No orders of the defined types were placed in this encounter. ? ? ?Medications Discontinued During This Encounter  ?Medication Reason  ? FLUoxetine  (PROZAC) 40 MG capsule Patient Preference  ? metFORMIN (GLUCOPHAGE) 500 MG tablet Reorder  ? topiramate (TOPAMAX) 50 MG tablet Reorder  ?  ? ?Meds ordered this encounter  ?Medications  ? metFORMIN (GLUCOPHAGE) 500 MG tablet  ?  Sig: 1 po with lunch and dinner daily  ?  Dispense:  180 tablet  ?  Refill:  0  ?  90 d supply;  ** OV for RF **   Do not send RF request  ? topiramate (TOPAMAX) 50 MG tablet  ?  Sig: 1 po twice daily  ?  Dispense:  180 tablet  ?  Refill:  0  ?  90 d supply;  ** OV for RF **   Do not send RF request  ?  ? ?1. Pre-diabetes ?We will refill metformin for 90 days, with no change in dose. Benigna will continue her prudent nutritional plan and weight loss.  ?- metFORMIN (GLUCOPHAGE) 500 MG tablet; 1 po with lunch and dinner daily  Dispense: 180 tablet; Refill: 0 ? ?2. Eating disorder- emotional eating ?Zaylynn will continue Topamax BID, and we will refill for 90 days until her next office visit. ? ?- topiramate (TOPAMAX) 50 MG tablet; 1 po twice daily  Dispense: 180 tablet; Refill: 0 ? ?3. Major depressive disorder, remission status unspecified, unspecified whether recurrent ?Herta will continue her psych care and medication management per her specialists. She will continue with her counselors, and will continue her prudent nutritional plan to help with mood recommended. Mood and food counseling was done.  ? ?4. At risk for side effect  of medication ?Christina [name] was given approximately 9 minutes of drug side effect counseling today.  We discussed side effect possibility and risk versus benefits. Christina [name] agreed to the medication and will contact this office if these side effects are intolerable. ? ?Repetitive spaced learning was employed today to elicit superior memory formation and behavioral change. ? ?5. Obesity with current BMI of 33.8 ?Christina [name] is currently in the action stage of change. As such, her goal is to continue with weight loss efforts. She has agreed to the Category 3 Plan.  ? ?Christina [name] was  advised to take a break and worry about her mental wellbeing at this time.  ? ?Exercise goals: All adults should avoid inactivity. Some physical activity is better than none, and adults who participate in any amount of physical activity gain some health benefits. ? ?Behavioral modification strategies: meal planning and cooking strategies, keeping healthy foods in the home, emotional eating strategies, and avoiding temptations. ? ?Christina [name] has agreed to follow-up with our clinic in 12 weeks. She was informed of the importance of frequent follow-up visits to maximize her success with intensive lifestyle modifications for her multiple health conditions.  ? ?Objective:  ? ?Blood pressure 117/76, pulse 78, temperature 98.1 ?F (36.7 ?C), height '5\' 7"'$  (1.702 m), weight 215 lb (97.5 kg), SpO2 98 %. ?Body mass index is 33.67 kg/m?. ? ?General: Cooperative, alert, well developed, in no acute distress. ?HEENT: Conjunctivae and lids unremarkable. ?Cardiovascular: Regular rhythm.  ?Lungs: Normal work of breathing. ?Neurologic: No focal deficits.  ? ?Lab Results  ?Component Value Date  ? CREATININE 0.97 10/28/2021  ? BUN 15 10/28/2021  ? NA 142 10/28/2021  ? K 4.6 10/28/2021  ? CL 109 10/28/2021  ? CO2 27 10/28/2021  ? ?Lab Results  ?Component Value Date  ? ALT 26 09/19/2021  ? AST 15 09/19/2021  ? ALKPHOS 61 09/19/2021  ? BILITOT <0.2 09/19/2021  ? ?Lab Results  ?Component Value Date  ? HGBA1C 6.1 (H) 09/19/2021  ? HGBA1C 5.8 (H) 04/10/2021  ? HGBA1C 6.0 (H) 11/13/2020  ? HGBA1C 6.3 (H) 05/15/2020  ? ?Lab Results  ?Component Value Date  ? INSULIN 25.2 (H) 09/19/2021  ? INSULIN 18.4 11/13/2020  ? INSULIN 34.7 (H) 05/15/2020  ? ?Lab Results  ?Component Value Date  ? TSH 4.130 09/19/2021  ? ?Lab Results  ?Component Value Date  ? CHOL 120 09/19/2021  ? HDL 43 09/19/2021  ? [place] 62 09/19/2021  ? TRIG 73 09/19/2021  ? ?Lab Results  ?Component Value Date  ? VD25OH 29.5 (L) 09/19/2021  ? VD25OH 51.4 04/10/2021  ? VD25OH 31.3 11/13/2020   ? ?Lab Results  ?Component Value Date  ? WBC 7.4 04/10/2021  ? HGB 13.5 04/10/2021  ? HCT 43.1 04/10/2021  ? MCV 88 04/10/2021  ? PLT 305 04/10/2021  ? ?No results found for: IRON, TIBC, FERRITIN ? ?Attestation Statements:  ? ?Reviewed by clinician on day of visit: allergies, medications, problem list, medical history, surgical history, family history, social history, and previous encounter notes. ? ? ?I, Bessie Boyte, am acting as transcriptionist for [employer], DO. ? ?I have reviewed the above documentation for accuracy and completeness, and I agree with the above. Marjory Sneddon, D.O. ? ?The [place] was signed into law in 2016 which includes the topic of electronic health records.  This provides immediate access to information in MyChart.  This includes consultation notes, operative notes, office notes, lab results and pathology reports.  If you have  any questions about what you read please let us know at your next visit so we can discuss your concerns and take corrective action if need be.  We are right here with you. ? ? ?

## 2022-01-15 ENCOUNTER — Inpatient Hospital Stay: Payer: 59 | Attending: Hematology and Oncology | Admitting: Hematology and Oncology

## 2022-01-15 NOTE — Assessment & Plan Note (Deleted)
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 Tamoxifen toxicities:Hot flashes have gone away. Denies any myalgias(patient had a prior hysterectomy) She has arthritis in her right hipimproved with cortisone injection.  Surveillance: No role of mammograms since she had bil mastectomies (bilateral reconstructed breasts without any palpable lumps or nodules) Chest exam: 01/15/22: No palpable lumps in chest wall or axilla.  Return to clinic in1 yearfor follow-up andbreast exams and surveillance

## 2022-01-20 ENCOUNTER — Telehealth: Payer: Self-pay | Admitting: Hematology and Oncology

## 2022-01-20 NOTE — Telephone Encounter (Signed)
Per 5/15 phone lines pt called to reschedule missed appointment  ?

## 2022-01-24 ENCOUNTER — Ambulatory Visit: Payer: 59 | Admitting: Orthopaedic Surgery

## 2022-01-29 NOTE — Progress Notes (Incomplete)
Patient Care Team: Maurice Small, MD as PCP - General (Family Medicine) Megan Salon, MD as Consulting Physician (Gynecology) Sylvan Cheese, NP as Nurse Practitioner (Hematology and Oncology)  DIAGNOSIS: No diagnosis found.  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 p(m)T1b, pN0: Stage IA; RIGHT mastectomy: PASH    01/09/2016 Oncotype testing   Oncotype score: 5; 5% ROR    02/2016 -  Anti-estrogen oral therapy   Tamoxifen 20 mg daily.       CHIEF COMPLIANT: Follow-up of breast cancer on tamoxifen     INTERVAL HISTORY: Christina [name] is a 56 y.o. with above-mentioned history of breast cancer treated with bilateral mastectomies followed by tamoxifen therapy. She presents to the clinic today for a follow-up    ALLERGIES:  has No Known Allergies.  MEDICATIONS:  Current Outpatient Medications  Medication Sig Dispense Refill   ALPRAZolam (XANAX) 0.25 MG tablet Take 0.25 mg by mouth daily as needed for anxiety.     amitriptyline (ELAVIL) 100 MG tablet Take 0.5 tablets (50 mg total) by mouth at bedtime.      cycloSPORINE (RESTASIS) 0.05 % ophthalmic emulsion Place 1 drop into both eyes 2 (two) times daily.     HYDROcodone-acetaminophen (NORCO) 5-325 MG tablet Take 1 tablet by mouth every 6 (six) hours as needed. 20 tablet 0   metFORMIN (GLUCOPHAGE) 500 MG tablet 1 po with lunch and dinner daily 180 tablet 0   Semaglutide, 1 MG/DOSE, 4 MG/3ML SOPN Inject 1 mg as directed once a week. 9 mL 0   tamoxifen (NOLVADEX) 20 MG tablet Take 1 tablet (20 mg total) by mouth daily. 90 tablet 3   topiramate (TOPAMAX) 50 MG tablet 1 po twice daily 180 tablet 0   vitamin B-12 (CYANOCOBALAMIN) 500 MCG tablet Take 1 tablet (500 mcg total) by mouth daily.     Vitamin D, Ergocalciferol, (DRISDOL) 1.25 MG (50000 UNIT) CAPS capsule 1 po q wed and 1 po q sun 8 capsule 0   No current facility-administered medications for this visit.    PHYSICAL EXAMINATION: ECOG PERFORMANCE STATUS: {CHL ONC ECOG PS:[id number]}  There were no vitals filed for this visit. There were no vitals filed for this visit.  BREAST:*** No palpable masses or nodules in either right or left breasts. No palpable axillary supraclavicular or infraclavicular adenopathy no breast tenderness or nipple discharge. (exam performed in the presence of a chaperone)  LABORATORY DATA:  I have reviewed the data as listed    Latest Ref Rng & Units 10/28/2021    3:13 PM 09/19/2021    7:33 AM 04/10/2021    9:26 AM  CMP  Glucose 70 - 99 mg/dL 88  85   87    BUN 6 - 20 mg/dL _0 Creatinine 0.44 - 1.00 mg/dL 0.97   0.87   0.73    Sodium 135 - 145 mmol/L 142   141   140    Potassium 3.5 - 5.1 mmol/L 4.6   4.2   4.9    Chloride 98 - 111 mmol/L 109   107   108    CO2 22 - 32 mmol/L _1 Calcium 8.9 - 10.3 mg/dL 10.3   9.8   10.3    Total Protein 6.0 - 8.5 g/dL  6.1   6.6    Total Bilirubin 0.0 - 1.2 mg/dL  <0.2   <0.2    Alkaline Phos 44 - 121 IU/L  61   75    AST 0 - 40 IU/L  15   14    ALT 0 - 32 IU/L  26   21      Lab Results   Component Value Date   WBC 7.4 04/10/2021   HGB 13.5 04/10/2021   HCT 43.1 04/10/2021   MCV 88 04/10/2021   PLT 305 04/10/2021   NEUTROABS 4.4 04/10/2021    ASSESSMENT & PLAN:  No problem-specific Assessment & Plan notes found for this encounter.    No orders of the defined types were placed in this encounter.  The patient has a good understanding of the overall plan. she agrees with it. she will call with any problems that may develop before the next visit here. Total time spent: 30 mins including face to face time and time spent for planning, charting and co-ordination of care   Suzzette Righter, [place] 01/29/22    I Gardiner Coins am scribing for Dr. Lindi Adie  ***

## 2022-01-31 ENCOUNTER — Ambulatory Visit: Payer: 59 | Admitting: Orthopaedic Surgery

## 2022-02-04 ENCOUNTER — Inpatient Hospital Stay: Payer: 59 | Admitting: Hematology and Oncology

## 2022-02-04 NOTE — Assessment & Plan Note (Deleted)
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 Tamoxifen toxicities:Hot flashes have gone away. Denies any myalgias(patient had a prior hysterectomy) She has arthritis in her right hipimproved with cortisone injection.  Surveillance: No role of mammograms since she had bil mastectomies (bilateral reconstructed breasts without any palpable lumps or nodules) Chest exam:  02/04/2022: No palpable lumps in chest wall or axilla.  Return to clinic in1 yearfor follow-up andbreast exams and surveillance

## 2022-02-11 ENCOUNTER — Ambulatory Visit (INDEPENDENT_AMBULATORY_CARE_PROVIDER_SITE_OTHER): Payer: 59

## 2022-02-11 ENCOUNTER — Ambulatory Visit (INDEPENDENT_AMBULATORY_CARE_PROVIDER_SITE_OTHER): Payer: 59 | Admitting: Orthopaedic Surgery

## 2022-02-11 ENCOUNTER — Encounter: Payer: Self-pay | Admitting: Orthopaedic Surgery

## 2022-02-11 ENCOUNTER — Other Ambulatory Visit: Payer: Self-pay

## 2022-02-11 DIAGNOSIS — M1711 Unilateral primary osteoarthritis, right knee: Secondary | ICD-10-CM | POA: Diagnosis not present

## 2022-02-11 MED ORDER — TRAMADOL HCL 50 MG PO TABS: 50.0000 mg | ORAL_TABLET | Freq: Two times a day (BID) | ORAL | 2 refills | Status: DC | PRN

## 2022-02-11 NOTE — Progress Notes (Signed)
Post-Op Visit Note   Patient: Christina [name]           Date of Birth: [birth year]           MRN: [id number] Visit Date: 02/11/2022 PCP: Maurice Small, MD   Assessment & Plan:  Chief Complaint:  Chief Complaint  Patient presents with   Right Knee - Follow-up    History of right knee arthroscopy 10/2021   Visit Diagnoses:  1. Primary osteoarthritis of right knee     Plan: Patient is a pleasant 56 year old female who comes in today with chronic right knee pain.  She underwent right knee arthroscopic debridement medial meniscus and chondroplasty, date of surgery 10/30/2021.  It was noted during operative intervention she had grade 3 and 4 changes throughout.  She has had continued pain despite undergoing cortisone injection on 12/26/2021 which only helped for about 2 weeks.  No fevers or chills.  Examination of the right knee reveals a moderate-sized effusion.  Range of motion 0 to 95 degrees.  Medial joint line tenderness.  She is neurovascular intact distally.  Today, the right knee was aspirated approximately 20 cc were obtained.  We have discussed further treatment to include total knee arthroplasty which she is agreeable to given her severity of arthritis and lack of response to conservative treatment options.  Risk, benefits and possible complications reviewed.  Rehab recovery time discussed.  All questions were answered.  Follow-Up Instructions: Return for post-op.   Orders:  Orders Placed This Encounter  Procedures   XR Knee 1-2 Views Right   Meds ordered this encounter  Medications   traMADol (ULTRAM) 50 MG tablet    Sig: Take 1 tablet (50 mg total) by mouth every 12 (twelve) hours as needed.    Dispense:  30 tablet    Refill:  2    Imaging: Moderate tricompartmental degenerative changes  PMFS History: Patient Active Problem List   Diagnosis Date Noted   Acute medial meniscus tear, right, initial encounter 10/01/2021   At risk for dehydration 01/28/2021   At risk for  malnutrition 01/01/2021   Obesity, Class I, BMI 30-34.9 12/31/2020   Vitamin B12 deficiency 10/08/2020   At risk for depression 10/08/2020   Mood disorder ([place]), with emotional eating 09/24/2020   At risk for impaired metabolic function 19/62/2297   At risk for diabetes mellitus 07/30/2020   Vitamin D deficiency 07/02/2020   Depression 07/02/2020   At risk for side effect of medication 07/02/2020   Stress due to illness of family member-  daughter with drug addiction 05/15/2020   Depression, recurrent ([place]) 05/15/2020   Anemia 05/15/2020   Constipation 05/15/2020   Prediabetes 05/15/2020   Breast asymmetry following reconstructive surgery 04/24/2020   Acquired absence of breast 04/24/2020   S/P breast reconstruction, bilateral 04/24/2020   Acute medial meniscus tear of left knee 12/20/2019   Breast cancer, left ([place]) 01/09/2016   Genetic testing 98/92/1194   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   Family history of breast cancer in female 11/08/2015   Family history of colon cancer 11/08/2015   Malignant neoplasm of central portion of left breast in female, estrogen receptor positive ([place]) 10/31/2015   Endometriosis 03/11/2011   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,  multifocal IDC, DCIS, ER/PR+,  01-09-2016 s/p bilteral mastectomies w/  left sln bx;  no chemoradiation   Cataracts, bilateral    Depression    GAD (generalized anxiety disorder)    Gallbladder problem    History of ovarian cyst    IBS (irritable bowel syndrome)    Joint pain    PONV (postoperative nausea and vomiting)    does well with scop patch   Pre-diabetes    followed by pcp; on metformin for weight loss -Dr Mellody Dance   Retinal detachment    Rheg OS   Right knee meniscal tear    Urgency of urination     Family History  Problem Relation Age of Onset   Heart  failure Father    Prostate cancer Father 70   Retinal detachment Father    Colon polyps Mother        approx 2   Other Mother        hx HPV and hysterectomy due to precancerous cells   Depression Mother    Anxiety disorder Mother    Other Sister 95       hx of hysterectomy for unspecified reason; still has ovaries   Other Sister 59       paternal half-sister hx of hysterectomy for unspecified reason; still has ovaries   Bladder Cancer Maternal Uncle 79       not a smoker   Kidney failure Maternal Grandmother    Congestive Heart Failure Maternal Grandmother    Colon cancer Maternal Grandmother 70   Diabetes Maternal Grandmother    Lung cancer Maternal Grandfather 50       smoker   Breast cancer Paternal Grandmother        dx. early 24s; w/ hx of trauma to breast   Crohn's disease Daughter    Lung cancer Maternal Uncle 37       smoker    Past Surgical History:  Procedure Laterality Date   ABDOMINAL HYSTERECTOMY  05/1996   endometriosis   ACHILLES TENDON REPAIR Right 2010;  revision 2011   BLADDER SUSPENSION  2000   BREAST BIOPSY Left 10/2015   BREAST IMPLANT REMOVAL Bilateral 08/12/2017   Procedure: REMOVAL BILATERAL BREAST IMPLANTS;  Surgeon: Wallace Going, DO;  Location: [place];  Service: Plastics;  Laterality: Bilateral;   BREAST RECONSTRUCTION WITH PLACEMENT OF TISSUE EXPANDER AND FLEX HD (ACELLULAR HYDRATED DERMIS) Bilateral 01/09/2016   BREAST RECONSTRUCTION WITH PLACEMENT OF TISSUE EXPANDER AND FLEX HD (ACELLULAR HYDRATED DERMIS) Bilateral 01/09/2016   Procedure: BREAST RECONSTRUCTION WITH PLACEMENT OF TISSUE EXPANDER AND FLEX HD (ACELLULAR HYDRATED DERMIS);  Surgeon: Loel Lofty Dillingham, DO;  Location: [place];  Service: Plastics;  Laterality: Bilateral;   BREAST RECONSTRUCTION WITH PLACEMENT OF TISSUE EXPANDER AND FLEX HD (ACELLULAR HYDRATED DERMIS) Bilateral 05/29/2016   Procedure: PLACEMENT OF BILATERAL TISSUE EXPANDER AND FLEX HD (ACELLULAR HYDRATED  DERMIS);  Surgeon: Wallace Going, DO;  Location: [place];  Service: Plastics;  Laterality: Bilateral;   BREAST REDUCTION SURGERY Bilateral 11/26/2016   Procedure: BILATERAL BREAST CAPSULE CONTRACTURE RELASE;  Surgeon: Wallace Going, DO;  Location: [place];  Service: Plastics;  Laterality: Bilateral;   [place]; 1989; [place] Right x3   last one 02-01-2019 @ [facility]   EYE SURGERY Left 10/01/2020   Pneumatic retinopexy for repair of rheg RD - Dr. Bernarda Caffey   EYE SURGERY Left 10/04/2020   PPV - Dr. Bernarda Caffey   FAT GRAFTING BILATERAL BREAST  08-09-2018  @  [facility]   GAS INSERTION Left 10/04/2020   Procedure: INSERTION OF GAS;  Surgeon: Bernarda Caffey, MD;  Location: [place];  Service: Ophthalmology;  Laterality: Left;   GAS/FLUID EXCHANGE Left 10/04/2020   Procedure: GAS/FLUID EXCHANGE;  Surgeon: Bernarda Caffey, MD;  Location: [place];  Service: Ophthalmology;  Laterality: Left;   INCISION AND DRAINAGE OF WOUND Bilateral 02/11/2016   Procedure: IRRIGATION AND DEBRIDEMENT OF BILATERAL BREAST POCKET;  Surgeon: Wallace Going, DO;  Location: [place];  Service: Plastics;  Laterality: Bilateral;   KNEE ARTHROSCOPY WITH MEDIAL MENISECTOMY Right 12/20/2019   Procedure: KNEE ARTHROSCOPY WITH MEDIAL MENISECTOMY;  Surgeon: Marchia Bond, MD;  Location: [place];  Service: Orthopedics;  Laterality: Right;   KNEE ARTHROSCOPY WITH MEDIAL MENISECTOMY Right 10/30/2021   Procedure: RIGHT KNEE ARTHROSCOPY WITH PARTIAL MEDIAL MENISECTOMY SYNOVECTOMY;  Surgeon: Leandrew Koyanagi, MD;  Location: [place];  Service: Orthopedics;  Laterality: Right;   LAPAROSCOPIC APPENDECTOMY  04-07-2011   '@[facility]'    w/ Excision peritoneal lipoma and lysis adhesions   LAPAROSCOPIC CHOLECYSTECTOMY  ~ [place] Right 10/04/2020   Procedure: LASER RETINOPEXY WITH INDIRECT LASER OPTHALMOSCOPE, RIGHT EYE;   Surgeon: Bernarda Caffey, MD;  Location: [place];  Service: Ophthalmology;  Laterality: Right;   LIPOSUCTION WITH LIPOFILLING Bilateral 11/26/2016   Procedure: LIPOFILLING FOR SYMMETRY;  Surgeon: Wallace Going, DO;  Location: [place];  Service: Plastics;  Laterality: Bilateral;   LIPOSUCTION WITH LIPOFILLING Bilateral 01/21/2017   Procedure: BILATERAL BREAST  LIPOFILLING FOR ASYMMETRY;  Surgeon: Wallace Going, DO;  Location: [place];  Service: Plastics;  Laterality: Bilateral;   LIPOSUCTION WITH LIPOFILLING Bilateral 06/28/2020   Procedure: Lipofilling bilateral breasts for asymmetry;  Surgeon: Wallace Going, DO;  Location: [place];  Service: Plastics;  Laterality: Bilateral;  90 min   MASTECTOMY Bilateral 01/09/2016   NIPPLE SPARING MASTECTOMY/SENTINAL LYMPH NODE BIOPSY/RECONSTRUCTION/PLACEMENT OF TISSUE EXPANDER Bilateral 01/09/2016   Procedure: BILATERAL NIPPLE SPARING MASTECTOMY WITH LEFT SENTINAL LYMPH NODE BIOPSY ;  Surgeon: Stark Klein, MD;  Location: [place];  Service: General;  Laterality: Bilateral;   PHOTOCOAGULATION WITH LASER Left 10/04/2020   Procedure: PHOTOCOAGULATION WITH LASER;  Surgeon: Bernarda Caffey, MD;  Location: [place];  Service: Ophthalmology;  Laterality: Left;   REMOVAL OF BILATERAL TISSUE EXPANDERS WITH PLACEMENT OF BILATERAL BREAST IMPLANTS Bilateral 08/20/2016   Procedure: REMOVAL OF BILATERAL TISSUE EXPANDERS WITH PLACEMENT OF BILATERAL SILICONE IMPLANTS;  Surgeon: Wallace Going, DO;  Location: [place];  Service: Plastics;  Laterality: Bilateral;   REMOVAL OF BILATERAL TISSUE EXPANDERS WITH PLACEMENT OF BILATERAL BREAST IMPLANTS Bilateral 11/05/2017   Procedure: REMOVAL OF BILATERAL TISSUE EXPANDERS WITH PLACEMENT OF BILATERAL BREAST SILICONE IMPLANTS;  Surgeon: Wallace Going, DO;  Location: [place];  Service: Plastics;  Laterality: Bilateral;   REMOVAL OF TISSUE  EXPANDER Bilateral 02/11/2016   Procedure: REMOVAL OF BILATERAL TISSUE EXPANDERS AND FLEX HD REMOVAL;  Surgeon: Wallace Going, DO;  Location: [place];  Service: Plastics;  Laterality: Bilateral;   RETINAL DETACHMENT SURGERY Left 10/01/2020   Pneumatic retinopexy for repair of rheg RD - Dr. Bernarda Caffey   RETINAL DETACHMENT SURGERY Left 10/04/2020   PPV - Dr. Bernarda Caffey   SCLERAL BUCKLE Left 10/04/2020   Procedure: SCLERAL BUCKLE LEFT EYE;  Surgeon: Bernarda Caffey, MD;  Location: [place];  Service: Ophthalmology;  Laterality: Left;   TISSUE EXPANDER PLACEMENT Bilateral  08/12/2017   Procedure: PLACEMENT OF BILATERAL TISSUE EXPANDER;  Surgeon: Wallace Going, DO;  Location: [place];  Service: Plastics;  Laterality: Bilateral;   TUBAL LIGATION Bilateral 1991   VITRECTOMY 25 GAUGE WITH SCLERAL BUCKLE Left 10/04/2020   Procedure: 25 GAUGE PARS PLANA VITRECTOMY LEFT EYE ;  Surgeon: Bernarda Caffey, MD;  Location: [place];  Service: Ophthalmology;  Laterality: Left;   Social History   Occupational History   Occupation: Holiday representative: Education administrator  Tobacco Use   Smoking status: Former    Packs/day: 1.00    Years: 10.00    Pack years: 10.00    Types: Cigarettes    Quit date: 06/08/2008    Years since quitting: 13.6   Smokeless tobacco: Never  Vaping Use   Vaping Use: Never used  Substance and Sexual Activity   Alcohol use: No   Drug use: Never   Sexual activity: Never    Birth control/protection: Surgical    Comment: hysterectomy

## 2022-03-14 ENCOUNTER — Other Ambulatory Visit: Payer: Self-pay

## 2022-03-14 MED ORDER — TAMOXIFEN CITRATE 20 MG PO TABS: 20.0000 mg | ORAL_TABLET | Freq: Every day | ORAL | 3 refills | Status: DC

## 2022-03-14 NOTE — Telephone Encounter (Signed)
Pt called to request refill for tamoxifen and states she has not taken it in a while but would like to resume. Refill sent to [facility] per pt request.

## 2022-03-17 ENCOUNTER — Other Ambulatory Visit: Payer: Self-pay | Admitting: *Deleted

## 2022-03-17 MED ORDER — TAMOXIFEN CITRATE 20 MG PO TABS: 20.0000 mg | ORAL_TABLET | Freq: Every day | ORAL | 3 refills | Status: DC

## 2022-03-18 ENCOUNTER — Ambulatory Visit (INDEPENDENT_AMBULATORY_CARE_PROVIDER_SITE_OTHER): Payer: 59 | Admitting: Family Medicine

## 2022-03-18 ENCOUNTER — Encounter (INDEPENDENT_AMBULATORY_CARE_PROVIDER_SITE_OTHER): Payer: Self-pay | Admitting: Family Medicine

## 2022-03-18 VITALS — BP 127/76 | HR 97 | Temp 98.1°F | Ht 67.0 in | Wt 219.0 lb

## 2022-03-18 DIAGNOSIS — E559 Vitamin D deficiency, unspecified: Secondary | ICD-10-CM

## 2022-03-18 DIAGNOSIS — R7303 Prediabetes: Secondary | ICD-10-CM

## 2022-03-18 DIAGNOSIS — F509 Eating disorder, unspecified: Secondary | ICD-10-CM

## 2022-03-18 DIAGNOSIS — M1711 Unilateral primary osteoarthritis, right knee: Secondary | ICD-10-CM

## 2022-03-18 DIAGNOSIS — E669 Obesity, unspecified: Secondary | ICD-10-CM

## 2022-03-18 DIAGNOSIS — Z6834 Body mass index (BMI) 34.0-34.9, adult: Secondary | ICD-10-CM

## 2022-03-18 MED ORDER — TOPIRAMATE 50 MG PO TABS: ORAL_TABLET | ORAL | 0 refills | Status: DC

## 2022-03-18 MED ORDER — METFORMIN HCL 500 MG PO TABS: ORAL_TABLET | ORAL | 0 refills | Status: DC

## 2022-03-18 MED ORDER — VITAMIN D (ERGOCALCIFEROL) 1.25 MG (50000 UNIT) PO CAPS: ORAL_CAPSULE | ORAL | 0 refills | Status: DC

## 2022-03-19 NOTE — Progress Notes (Signed)
Chief Complaint:   OBESITY Christina [name] is here to discuss her progress with her obesity treatment plan along with follow-up of her obesity related diagnoses. Christina [name] is on the Category 3 Plan and states she is following her eating plan approximately 0% of the time. Christina [name] states she is not exercising.  Today's visit was #: 23 Starting weight: 218 lbs Starting date: 05/15/2020 Today's weight: 219 lbs Today's date: 03/18/2022 Total lbs lost to date: 0 Total lbs lost since last in-office visit: +4  Interim History: Last office visit was 3 months ago.  Has not been following meal plan at all, because she is not ready to make changes with her diet lately.  Struggling with depression and she is getting treatment with psych doctor at [place], changes recently made to medications.  Subjective:   1. Pre-diabetes Christina [name] has a diagnosis of prediabetes based on her elevated HgA1c and was informed this puts her at greater risk of developing diabetes. She continues to work on diet and exercise to decrease her risk of diabetes. She denies nausea or hypoglycemia.   2. Vitamin D deficiency She is currently taking prescription vitamin D 50,000 IU each week. She denies nausea, vomiting or muscle weakness.  3. Osteoarthritis of right knee, unspecified osteoarthritis type,severe On July 24th had a total right knee replacement by Dr Erlinda Hong.  4. Eating disorder- emotional eating Christina [name] is struggling with emotional eating and using food for comfort to the extent that it is negatively impacting her health. She has been working on behavior modification techniques to help reduce her emotional eating and has been unsuccessful. She shows no signs of suicidal or homicidal ideations.   Assessment/Plan:  No orders of the defined types were placed in this encounter.   Medications Discontinued During This Encounter  Medication Reason   Vitamin D, Ergocalciferol, (DRISDOL) 1.25 MG (50000 UNIT) CAPS capsule  Reorder   metFORMIN (GLUCOPHAGE) 500 MG tablet Reorder   topiramate (TOPAMAX) 50 MG tablet Reorder     Meds ordered this encounter  Medications   topiramate (TOPAMAX) 50 MG tablet    Sig: 1 po twice daily    Dispense:  60 tablet    Refill:  0    ** OV for RF **   Do not send RF request   metFORMIN (GLUCOPHAGE) 500 MG tablet    Sig: 1 po with lunch and dinner daily    Dispense:  60 tablet    Refill:  0    ** OV for RF **   Do not send RF request   Vitamin D, Ergocalciferol, (DRISDOL) 1.25 MG (50000 UNIT) CAPS capsule    Sig: 1 po q wed and 1 po q sun    Dispense:  8 capsule    Refill:  0     1. Pre-diabetes Christina [name] will continue to work on weight loss, exercise, and decreasing simple carbohydrates to help decrease the risk of diabetes.   Refill- metFORMIN (GLUCOPHAGE) 500 MG tablet; 1 po with lunch and dinner daily  Dispense: 60 tablet; Refill: 0  2. Vitamin D deficiency Low Vitamin D level contributes to fatigue and are associated with obesity, breast, and colon cancer. She agrees to continue to take prescription Vitamin D '@50'$ ,000 IU every week and will follow-up for routine testing of Vitamin D, at least 2-3 times per year to avoid over-replacement.  Refill- Vitamin D, Ergocalciferol, (DRISDOL) 1.25 MG (50000 UNIT) CAPS capsule; 1 po q wed and 1 po q sun  Dispense:  8 capsule; Refill: 0  3. Osteoarthritis of right knee, unspecified osteoarthritis type,severe Christina [name] desires to come in soon for repeat IC due to upcoming symptoms.  Follow up care per orthopedic doctor.   4. Eating disorder- emotional eating Behavior modification techniques were discussed today to help Christina [name] deal with her emotional/non-hunger eating behaviors.  Orders and follow up as documented in patient record.   Refill - topiramate (TOPAMAX) 50 MG tablet; 1 po twice daily  Dispense: 60 tablet; Refill: 0  5. Obesity with current BMI of 34.3 Christina [name] has not been coming regularly.  Lost to follow up and is ready  to do better with commitment. She needs repeat IC and fasting blood work in the near future.   Christina [name] is currently in the action stage of change. As such, her goal is to continue with weight loss efforts. She has agreed to the Category 3 Plan  Exercise goals:  As is.   Behavioral modification strategies: Contemplative strategies of change.  She will work with psych counselor on getting to preparation and action stages of change.   Christina [name] has agreed to follow-up with our clinic in 2-3 weeks. She was informed of the importance of frequent follow-up visits to maximize her success with intensive lifestyle modifications for her multiple health conditions.   Objective:   Blood pressure 127/76, pulse 97, temperature 98.1 F (36.7 C), height '5\' 7"'$  (1.702 m), weight 219 lb (99.3 kg), SpO2 98 %. Body mass index is 34.3 kg/m.  General: Cooperative, alert, well developed, in no acute distress. HEENT: Conjunctivae and lids unremarkable. Cardiovascular: Regular rhythm.  Lungs: Normal work of breathing. Neurologic: No focal deficits.   Lab Results  Component Value Date   CREATININE 0.97 10/28/2021   BUN 15 10/28/2021   NA 142 10/28/2021   K 4.6 10/28/2021   CL 109 10/28/2021   CO2 27 10/28/2021   Lab Results  Component Value Date   ALT 26 09/19/2021   AST 15 09/19/2021   ALKPHOS 61 09/19/2021   BILITOT <0.2 09/19/2021   Lab Results  Component Value Date   HGBA1C 6.1 (H) 09/19/2021   HGBA1C 5.8 (H) 04/10/2021   HGBA1C 6.0 (H) 11/13/2020   HGBA1C 6.3 (H) 05/15/2020   Lab Results  Component Value Date   INSULIN 25.2 (H) 09/19/2021   INSULIN 18.4 11/13/2020   INSULIN 34.7 (H) 05/15/2020   Lab Results  Component Value Date   TSH 4.130 09/19/2021   Lab Results  Component Value Date   CHOL 120 09/19/2021   HDL 43 09/19/2021   LDLCALC 62 09/19/2021   TRIG 73 09/19/2021   Lab Results  Component Value Date   VD25OH 29.5 (L) 09/19/2021   VD25OH 51.4 04/10/2021   VD25OH 31.3  11/13/2020   Lab Results  Component Value Date   WBC 7.4 04/10/2021   HGB 13.5 04/10/2021   HCT 43.1 04/10/2021   MCV 88 04/10/2021   PLT 305 04/10/2021   No results found for: "IRON", "TIBC", "FERRITIN"  Attestation Statements:   Reviewed by clinician on day of visit: allergies, medications, problem list, medical history, surgical history, family history, social history, and previous encounter notes.  I, Davy Pique, RMA, am acting as Location manager for [employer], DO.  I have reviewed the above documentation for accuracy and completeness, and I agree with the above. Marjory Sneddon, D.O.  The [place] was signed into law in 2016 which includes the topic of electronic health records.  This provides immediate  access to information in MyChart.  This includes consultation notes, operative notes, office notes, lab results and pathology reports.  If you have any questions about what you read please let us know at your next visit so we can discuss your concerns and take corrective action if need be.  We are right here with you.

## 2022-03-20 DIAGNOSIS — F509 Eating disorder, unspecified: Secondary | ICD-10-CM | POA: Insufficient documentation

## 2022-03-20 DIAGNOSIS — M1711 Unilateral primary osteoarthritis, right knee: Secondary | ICD-10-CM | POA: Insufficient documentation

## 2022-03-24 ENCOUNTER — Other Ambulatory Visit: Payer: Self-pay | Admitting: Physician Assistant

## 2022-03-24 ENCOUNTER — Ambulatory Visit (INDEPENDENT_AMBULATORY_CARE_PROVIDER_SITE_OTHER): Payer: 59 | Admitting: Adult Health

## 2022-03-24 MED ORDER — ASPIRIN 81 MG PO TBEC: 81.0000 mg | DELAYED_RELEASE_TABLET | Freq: Two times a day (BID) | ORAL | 0 refills | Status: DC

## 2022-03-24 MED ORDER — DOCUSATE SODIUM 100 MG PO CAPS: 100.0000 mg | ORAL_CAPSULE | Freq: Every day | ORAL | 2 refills | Status: DC | PRN

## 2022-03-24 MED ORDER — METHOCARBAMOL 750 MG PO TABS: 750.0000 mg | ORAL_TABLET | Freq: Two times a day (BID) | ORAL | 2 refills | Status: DC | PRN

## 2022-03-24 MED ORDER — ONDANSETRON HCL 4 MG PO TABS: 4.0000 mg | ORAL_TABLET | Freq: Three times a day (TID) | ORAL | 0 refills | Status: DC | PRN

## 2022-03-24 MED ORDER — OXYCODONE-ACETAMINOPHEN 5-325 MG PO TABS: 1.0000 | ORAL_TABLET | Freq: Three times a day (TID) | ORAL | 0 refills | Status: DC | PRN

## 2022-03-24 NOTE — Pre-Procedure Instructions (Signed)
Surgical Instructions    Your procedure is scheduled on Monday, July 24th.  Report to [facility] Main Entrance "A" at 08:00 A.M., then check in with the Admitting office.  Call this number if you have problems the morning of surgery:  [phone]   If you have any questions prior to your surgery date call [phone]: Open Monday-Friday 8am-4pm    Remember:  Do not eat after midnight the night before your surgery  You may drink clear liquids until 07:00 AM the morning of your surgery.   Clear liquids allowed are: Water, Non-Citrus Juices (without pulp), Carbonated Beverages, Clear Tea, Black Coffee Only (NO MILK, CREAM OR POWDERED CREAMER of any kind), and Gatorade.    Patient Instructions  The night before surgery:  No food after midnight. ONLY clear liquids after midnight   The day of surgery (if you have diabetes): Drink ONE (1) 12 oz G2 given to you in your pre admission testing appointment by 07:00 AM the morning of surgery. Drink in one sitting. Do not sip.  This drink was given to you during your hospital  pre-op appointment visit.  Nothing else to drink after completing the  12 oz bottle of G2.         If you have questions, please contact your surgeon's office.     Take these medicines the morning of surgery with A SIP OF WATER  cycloSPORINE (RESTASIS) eye drops tamoxifen (NOLVADEX)  topiramate (TOPAMAX)    If needed: ALPRAZolam Duanne Moron) HYDROcodone-acetaminophen (NORCO)  traMADol (ULTRAM)    WHAT DO I DO ABOUT MY DIABETES MEDICATION?   Do not take metFORMIN (GLUCOPHAGE) or Semaglutide the morning of surgery.    HOW TO MANAGE YOUR DIABETES BEFORE AND AFTER SURGERY  Why is it important to control my blood sugar before and after surgery? Improving blood sugar levels before and after surgery helps healing and can limit problems. A way of improving blood sugar control is eating a healthy diet by:  Eating less sugar and carbohydrates  Increasing  activity/exercise  Talking with your doctor about reaching your blood sugar goals High blood sugars (greater than 180 mg/dL) can raise your risk of infections and slow your recovery, so you will need to focus on controlling your diabetes during the weeks before surgery. Make sure that the doctor who takes care of your diabetes knows about your planned surgery including the date and location.  How do I manage my blood sugar before surgery? Check your blood sugar at least 4 times a day, starting 2 days before surgery, to make sure that the level is not too high or low.  Check your blood sugar the morning of your surgery when you wake up and every 2 hours until you get to the Short Stay unit.  If your blood sugar is less than 70 mg/dL, you will need to treat for low blood sugar: Do not take insulin. Treat a low blood sugar (less than 70 mg/dL) with  cup of clear juice (cranberry or apple), 4 glucose tablets, OR glucose gel. Recheck blood sugar in 15 minutes after treatment (to make sure it is greater than 70 mg/dL). If your blood sugar is not greater than 70 mg/dL on recheck, call [phone] for further instructions. Report your blood sugar to the short stay nurse when you get to Short Stay.  If you are admitted to the hospital after surgery: Your blood sugar will be checked by the staff and you will probably be given insulin after surgery (  instead of oral diabetes medicines) to make sure you have good blood sugar levels. The goal for blood sugar control after surgery is 80-180 mg/dL.    As of today, STOP taking any Aspirin (unless otherwise instructed by your surgeon) Aleve, Naproxen, Ibuprofen, Motrin, Advil, Goody's, BC's, all herbal medications, fish oil, and all vitamins.                     Do NOT Smoke (Tobacco/Vaping) for 24 hours prior to your procedure.  If you use a CPAP at night, you may bring your mask/headgear for your overnight stay.   Contacts, glasses, piercing's, hearing  aid's, dentures or partials may not be worn into surgery, please bring cases for these belongings.    For patients admitted to the hospital, discharge time will be determined by your treatment team.   Patients discharged the day of surgery will not be allowed to drive home, and someone needs to stay with them for 24 hours.  SURGICAL WAITING ROOM VISITATION Patients having surgery or a procedure may have two support people in the waiting room. These visitors may be switched out with other visitors if needed. Children under the age of 24 must have an adult accompany them who is not the patient. If the patient needs to stay at the hospital during part of their recovery, the visitor guidelines for inpatient rooms apply.  Please refer to the [facility] website for the visitor guidelines for Inpatients (after your surgery is over and you are in a regular room).    Special instructions:   [facility]- Preparing For Surgery  Before surgery, you can play an important role. Because skin is not sterile, your skin needs to be as free of germs as possible. You can reduce the number of germs on your skin by washing with CHG (chlorahexidine gluconate) Soap before surgery.  CHG is an antiseptic cleaner which kills germs and bonds with the skin to continue killing germs even after washing.    Oral Hygiene is also important to reduce your risk of infection.  Remember - BRUSH YOUR TEETH THE MORNING OF SURGERY WITH YOUR REGULAR TOOTHPASTE  Please do not use if you have an allergy to CHG or antibacterial soaps. If your skin becomes reddened/irritated stop using the CHG.  Do not shave (including legs and underarms) for at least 48 hours prior to first CHG shower. It is OK to shave your face.  Please follow these instructions carefully.   Shower the NIGHT BEFORE SURGERY and the MORNING OF SURGERY  If you chose to wash your hair, wash your hair first as usual with your normal shampoo.  After you shampoo, rinse  your hair and body thoroughly to remove the shampoo.  Use CHG Soap as you would any other liquid soap. You can apply CHG directly to the skin and wash gently with a scrungie or a clean washcloth.   Apply the CHG Soap to your body ONLY FROM THE NECK DOWN.  Do not use on open wounds or open sores. Avoid contact with your eyes, ears, mouth and genitals (private parts). Wash Face and genitals (private parts)  with your normal soap.   Wash thoroughly, paying special attention to the area where your surgery will be performed.  Thoroughly rinse your body with warm water from the neck down.  DO NOT shower/wash with your normal soap after using and rinsing off the CHG Soap.  Pat yourself dry with a CLEAN TOWEL.  [place]  to bed the night before surgery  Place CLEAN SHEETS on your bed the night before your surgery  DO NOT SLEEP WITH PETS.   Day of Surgery: Take a shower with CHG soap. Do not wear jewelry or makeup Do not wear lotions, powders, perfumes, or deodorant. Do not shave 48 hours prior to surgery.   Do not bring valuables to the hospital. [facility] is not responsible for any belongings or valuables. Do not wear nail polish, gel polish, artificial nails, or any other type of covering on natural nails (fingers and toes) If you have artificial nails or gel coating that need to be removed by a nail salon, please have this removed prior to surgery. Artificial nails or gel coating may interfere with anesthesia's ability to adequately monitor your vital signs. Wear Clean/Comfortable clothing the morning of surgery Remember to brush your teeth WITH YOUR REGULAR TOOTHPASTE.   Please read over the following fact sheets that you were given.    If you received a COVID test during your pre-op visit  it is requested that you wear a mask when out in public, stay away from anyone that may not be feeling well and notify your surgeon if you develop symptoms. If you have been in contact  with anyone that has tested positive in the last 10 days please notify you surgeon.

## 2022-03-25 ENCOUNTER — Other Ambulatory Visit: Payer: Self-pay

## 2022-03-25 ENCOUNTER — Encounter (HOSPITAL_COMMUNITY)
Admission: RE | Admit: 2022-03-25 | Discharge: 2022-03-25 | Disposition: A | Discharge status: Home / Self Care | Payer: 59 | Source: Ambulatory Visit | Attending: Orthopaedic Surgery | Admitting: Orthopaedic Surgery

## 2022-03-25 ENCOUNTER — Encounter (HOSPITAL_COMMUNITY): Payer: Self-pay

## 2022-03-25 VITALS — BP 125/82 | HR 68 | Temp 98.1°F | Resp 17 | Ht 67.0 in | Wt 223.4 lb

## 2022-03-25 DIAGNOSIS — M1711 Unilateral primary osteoarthritis, right knee: Secondary | ICD-10-CM | POA: Diagnosis not present

## 2022-03-25 DIAGNOSIS — Z01818 Encounter for other preprocedural examination: Secondary | ICD-10-CM

## 2022-03-25 DIAGNOSIS — E119 Type 2 diabetes mellitus without complications: Secondary | ICD-10-CM | POA: Diagnosis not present

## 2022-03-25 DIAGNOSIS — Z01812 Encounter for preprocedural laboratory examination: Secondary | ICD-10-CM | POA: Insufficient documentation

## 2022-03-25 HISTORY — DX: Type 2 diabetes mellitus without complications: E11.9

## 2022-03-25 LAB — HEMOGLOBIN A1C
Hgb A1c MFr Bld: 5.9 % — ABNORMAL HIGH (ref 4.8–5.6)
Mean Plasma Glucose: 122.63 mg/dL

## 2022-03-25 LAB — COMPREHENSIVE METABOLIC PANEL
ALT: 39 U/L (ref 0–44)
AST: 23 U/L (ref 15–41)
Albumin: 3.7 g/dL (ref 3.5–5.0)
Alkaline Phosphatase: 57 U/L (ref 38–126)
Anion gap: 7 (ref 5–15)
BUN: 13 mg/dL (ref 6–20)
CO2: 27 mmol/L (ref 22–32)
Calcium: 10.3 mg/dL (ref 8.9–10.3)
Chloride: 104 mmol/L (ref 98–111)
Creatinine, Ser: 0.83 mg/dL (ref 0.44–1.00)
GFR, Estimated: >60 mL/min (ref >60)
Glucose, Bld: 123 mg/dL — ABNORMAL HIGH (ref 70–99)
Potassium: 4 mmol/L (ref 3.5–5.1)
Sodium: 138 mmol/L (ref 135–145)
Total Bilirubin: 0.7 mg/dL (ref 0.3–1.2)
Total Protein: 6.8 g/dL (ref 6.5–8.1)

## 2022-03-25 LAB — GLUCOSE, CAPILLARY: Glucose-Capillary: 126 mg/dL — ABNORMAL HIGH (ref 70–99)

## 2022-03-25 LAB — CBC
HCT: 42.2 % (ref 36.0–46.0)
Hemoglobin: 13.3 g/dL (ref 12.0–15.0)
MCH: 26.7 pg (ref 26.0–34.0)
MCHC: 31.5 g/dL (ref 30.0–36.0)
MCV: 84.7 fL (ref 80.0–100.0)
Platelets: 289 10*3/uL (ref 150–400)
RBC: 4.98 MIL/uL (ref 3.87–5.11)
RDW: 14.5 % (ref 11.5–15.5)
WBC: 6 10*3/uL (ref 4.0–10.5)
nRBC: 0 % (ref 0.0–0.2)

## 2022-03-25 LAB — SURGICAL PCR SCREEN
MRSA, PCR: NEGATIVE
Staphylococcus aureus: NEGATIVE

## 2022-03-25 NOTE — Progress Notes (Addendum)
PCP - Dr. Maurice Small Cardiologist - denies  PPM/ICD - denies   Chest x-ray - 01/27/16 EKG - 10/28/21 Stress Test - denies ECHO - denies Cardiac Cath - denies  Sleep Study - denies   DM- Type 2 Pt does not check CBG at home  ASA/Blood Thinner Instructions: n/a   ERAS Protcol - yes PRE-SURGERY G2- given at PAT  COVID TEST- n/a   Anesthesia review: yes, heart murmur. Jeneen Rinks saw pt in PAT  Patient denies shortness of breath, fever, cough and chest pain at PAT appointment   All instructions explained to the patient, with a verbal understanding of the material. Patient agrees to go over the instructions while at home for a better understanding. The opportunity to ask questions was provided.

## 2022-03-25 NOTE — Progress Notes (Signed)
Anesthesia Chart Review:  I evaluated patient at PAT appointment due to her report of cardiac murmur.  She states that she has been told intermittently over the years that she has a heart murmur, however, she states she has never had any evaluation of this.  She denies any history of chest pain, shortness of breath, syncope, palpitations.  Denies any history of cardiac disease.  Her activity is limited secondary to orthopedic pain, however she does states she can go up 2 flights of stairs without chest pain, although does endorse some shortness of breath that she feels is likely related to deconditioning.  On exam she is well-appearing, NAD, heart regular rate and rhythm, 1 out of 6 systolic murmur.  Reviewed case with anesthesiologist Dr. [name] Robert.  He advised that since she is posted for spinal anesthesia, he would like to fully characterize her murmur with an echocardiogram.  I reached out to her PCP to facilitate this, unfortunately, her PCP has left that practice.  We will arrange for the patient to have an echocardiogram in preop holding on day of surgery.  I spoke with the patient to explain the reasoning and she verbalized understanding. I have reached out to the echo tech to make them aware.  I have also notified Jovita Kussmaul, Therapist, sports.  Preop labs reviewed, unremarkable.  Non-insulin-dependent DM2 well-controlled with A1c 5.9.  Wynonia Musty [facility] Short Stay Center/Anesthesiology Phone [phone] 03/25/2022 3:25 PM

## 2022-03-26 NOTE — Anesthesia Preprocedure Evaluation (Addendum)
Anesthesia Evaluation  Patient identified by MRN, date of birth, ID band Patient awake    Reviewed: Allergy & Precautions, H&P , NPO status , Patient's Chart, lab work & pertinent test results  History of Anesthesia Complications (+) PONV and history of anesthetic complications  Airway Mallampati: II   Neck ROM: full    Dental   Pulmonary former smoker,    breath sounds clear to auscultation       Cardiovascular negative cardio ROS   Rhythm:regular Rate:Normal     Neuro/Psych PSYCHIATRIC DISORDERS Anxiety Depression    GI/Hepatic   Endo/Other  diabetes, Type 2  Renal/GU      Musculoskeletal  (+) Arthritis ,   Abdominal   Peds  Hematology   Anesthesia Other Findings   Reproductive/Obstetrics                           Anesthesia Physical Anesthesia Plan  ASA: 2  Anesthesia Plan: MAC and Spinal   Post-op Pain Management: Regional block*   Induction: Intravenous  PONV Risk Score and Plan: 3 and Ondansetron, Dexamethasone, Midazolam and Treatment may vary due to age or medical condition  Airway Management Planned: Simple Face Mask  Additional Equipment:   Intra-op Plan:   Post-operative Plan:   Informed Consent: I have reviewed the patients History and Physical, chart, labs and discussed the procedure including the risks, benefits and alternatives for the proposed anesthesia with the patient or authorized representative who has indicated his/her understanding and acceptance.     Dental advisory given  Plan Discussed with: CRNA, Anesthesiologist and Surgeon  Anesthesia Plan Comments: (PAT note by Karoline Caldwell, PA-C: I evaluated patient at PAT appointment due to her report of cardiac murmur.  She states that she has been told intermittently over the years that she has a heart murmur, however, she states she has never had any evaluation of this.  She denies any history of chest  pain, shortness of breath, syncope, palpitations.  Denies any history of cardiac disease.  Her activity is limited secondary to orthopedic pain, however she does states she can go up 2 flights of stairs without chest pain, although does endorse some shortness of breath that she feels is likely related to deconditioning.  On exam she is well-appearing, NAD, heart regular rate and rhythm, 1 out of 6 systolic murmur.  Reviewed case with anesthesiologist Dr. [name] Robert.  He advised that since she is posted for spinal anesthesia, he would like to fully characterize her murmur with an echocardiogram.  I reached out to her PCP to facilitate this, unfortunately, her PCP has left that practice.  We will arrange for the patient to have an echocardiogram in preop holding on day of surgery.  I spoke with the patient to explain the reasoning and she verbalized understanding. I have reached out to the echo tech to make them aware.  I have also notified Jovita Kussmaul, Therapist, sports.  Preop labs reviewed, unremarkable.  Non-insulin-dependent DM2 well-controlled with A1c 5.9. )       Anesthesia Quick Evaluation

## 2022-03-27 ENCOUNTER — Ambulatory Visit (INDEPENDENT_AMBULATORY_CARE_PROVIDER_SITE_OTHER): Payer: 59 | Admitting: Adult Health

## 2022-03-28 MED ORDER — TRANEXAMIC ACID 1000 MG/10ML IV SOLN
2000.0000 mg | INTRAVENOUS | Status: DC
  Filled 2022-03-28: qty 20

## 2022-03-31 ENCOUNTER — Encounter (HOSPITAL_COMMUNITY): Payer: Self-pay | Admitting: Orthopaedic Surgery

## 2022-03-31 ENCOUNTER — Observation Stay (HOSPITAL_COMMUNITY)
Admission: RE | Admit: 2022-03-31 | Discharge: 2022-04-01 | Disposition: A | Discharge status: Home / Self Care | Payer: 59 | Attending: Orthopaedic Surgery | Admitting: Orthopaedic Surgery

## 2022-03-31 ENCOUNTER — Other Ambulatory Visit: Payer: Self-pay

## 2022-03-31 ENCOUNTER — Ambulatory Visit (HOSPITAL_COMMUNITY): Payer: 59 | Admitting: Physician Assistant

## 2022-03-31 ENCOUNTER — Ambulatory Visit (HOSPITAL_BASED_OUTPATIENT_CLINIC_OR_DEPARTMENT_OTHER): Payer: 59

## 2022-03-31 ENCOUNTER — Encounter (HOSPITAL_COMMUNITY)
Admission: RE | Disposition: A | Discharge status: Home / Self Care | Payer: Self-pay | Source: Home / Self Care | Attending: Orthopaedic Surgery

## 2022-03-31 ENCOUNTER — Observation Stay (HOSPITAL_COMMUNITY): Payer: 59

## 2022-03-31 ENCOUNTER — Ambulatory Visit (HOSPITAL_BASED_OUTPATIENT_CLINIC_OR_DEPARTMENT_OTHER): Payer: 59 | Admitting: Physician Assistant

## 2022-03-31 DIAGNOSIS — Z853 Personal history of malignant neoplasm of breast: Secondary | ICD-10-CM | POA: Diagnosis not present

## 2022-03-31 DIAGNOSIS — Z79899 Other long term (current) drug therapy: Secondary | ICD-10-CM | POA: Insufficient documentation

## 2022-03-31 DIAGNOSIS — Z7982 Long term (current) use of aspirin: Secondary | ICD-10-CM | POA: Insufficient documentation

## 2022-03-31 DIAGNOSIS — M1711 Unilateral primary osteoarthritis, right knee: Principal | ICD-10-CM | POA: Insufficient documentation

## 2022-03-31 DIAGNOSIS — R011 Cardiac murmur, unspecified: Secondary | ICD-10-CM | POA: Diagnosis not present

## 2022-03-31 DIAGNOSIS — E119 Type 2 diabetes mellitus without complications: Secondary | ICD-10-CM | POA: Diagnosis not present

## 2022-03-31 DIAGNOSIS — Z96651 Presence of right artificial knee joint: Secondary | ICD-10-CM

## 2022-03-31 DIAGNOSIS — Z87891 Personal history of nicotine dependence: Secondary | ICD-10-CM | POA: Insufficient documentation

## 2022-03-31 HISTORY — PX: TOTAL KNEE ARTHROPLASTY: SHX125

## 2022-03-31 LAB — GLUCOSE, CAPILLARY
Glucose-Capillary: 119 mg/dL — ABNORMAL HIGH (ref 70–99)
Glucose-Capillary: 108 mg/dL — ABNORMAL HIGH (ref 70–99)
Glucose-Capillary: 87 mg/dL (ref 70–99)
Glucose-Capillary: 87 mg/dL (ref 70–99)
Glucose-Capillary: 114 mg/dL — ABNORMAL HIGH (ref 70–99)
Glucose-Capillary: 208 mg/dL — ABNORMAL HIGH (ref 70–99)

## 2022-03-31 LAB — ECHOCARDIOGRAM COMPLETE
Area-P 1/2: 3.48 cm2
Height: 67 in
S' Lateral: 1.9 cm
Weight: 3200 oz

## 2022-03-31 SURGERY — ARTHROPLASTY, KNEE, TOTAL
Anesthesia: Monitor Anesthesia Care | Site: Knee | Laterality: Right

## 2022-03-31 MED ORDER — BUPIVACAINE-MELOXICAM ER 400-12 MG/14ML IJ SOLN
INTRAMUSCULAR | Status: AC
  Filled 2022-03-31: qty 1

## 2022-03-31 MED ORDER — ONDANSETRON HCL 4 MG/2ML IJ SOLN
INTRAMUSCULAR | Status: DC | PRN
  Administered 2022-03-31: 4 mg via INTRAVENOUS

## 2022-03-31 MED ORDER — VANCOMYCIN HCL 1000 MG IV SOLR
INTRAVENOUS | Status: AC
Start: 2022-03-31 — End: ?
  Filled 2022-03-31: qty 20

## 2022-03-31 MED ORDER — ACETAMINOPHEN 10 MG/ML IV SOLN
INTRAVENOUS | Status: AC
  Filled 2022-03-31: qty 100

## 2022-03-31 MED ORDER — CEFAZOLIN SODIUM-DEXTROSE 2-4 GM/100ML-% IV SOLN
2.0000 g | INTRAVENOUS | Status: AC
  Administered 2022-03-31: 2 g via INTRAVENOUS

## 2022-03-31 MED ORDER — CEFAZOLIN SODIUM-DEXTROSE 2-4 GM/100ML-% IV SOLN
INTRAVENOUS | Status: AC
  Filled 2022-03-31: qty 100

## 2022-03-31 MED ORDER — PHENOL 1.4 % MT LIQD
1.0000 | OROMUCOSAL | Status: DC | PRN
Start: 2022-03-31 — End: 2022-04-01

## 2022-03-31 MED ORDER — ROPIVACAINE HCL 5 MG/ML IJ SOLN
INTRAMUSCULAR | Status: DC | PRN
  Administered 2022-03-31: 25 mL via PERINEURAL

## 2022-03-31 MED ORDER — METOCLOPRAMIDE HCL 5 MG/ML IJ SOLN: 5.0000 mg | Freq: Three times a day (TID) | INTRAMUSCULAR | Status: DC | PRN

## 2022-03-31 MED ORDER — MIDAZOLAM HCL 2 MG/2ML IJ SOLN
INTRAMUSCULAR | Status: AC
  Filled 2022-03-31: qty 2

## 2022-03-31 MED ORDER — OXYCODONE HCL 5 MG/5ML PO SOLN: 5.0000 mg | Freq: Once | ORAL | Status: AC | PRN

## 2022-03-31 MED ORDER — ACETAMINOPHEN 500 MG PO TABS
1000.0000 mg | ORAL_TABLET | Freq: Four times a day (QID) | ORAL | Status: DC
  Administered 2022-03-31 – 2022-04-01 (×3): 1000 mg via ORAL
  Filled 2022-03-31 (×3): qty 2

## 2022-03-31 MED ORDER — ACETAMINOPHEN 10 MG/ML IV SOLN
INTRAVENOUS | Status: DC | PRN
  Administered 2022-03-31: 1000 mg via INTRAVENOUS

## 2022-03-31 MED ORDER — PHENYLEPHRINE HCL-NACL 20-0.9 MG/250ML-% IV SOLN
INTRAVENOUS | Status: DC | PRN
  Administered 2022-03-31: 25 ug/min via INTRAVENOUS

## 2022-03-31 MED ORDER — SODIUM CHLORIDE 0.9 % IR SOLN
Status: DC | PRN
  Administered 2022-03-31: 1000 mL

## 2022-03-31 MED ORDER — KETOROLAC TROMETHAMINE 15 MG/ML IJ SOLN
15.0000 mg | Freq: Four times a day (QID) | INTRAMUSCULAR | Status: DC
  Administered 2022-03-31 – 2022-04-01 (×3): 15 mg via INTRAVENOUS
  Filled 2022-03-31 (×3): qty 1

## 2022-03-31 MED ORDER — PROPOFOL 500 MG/50ML IV EMUL
INTRAVENOUS | Status: DC | PRN
  Administered 2022-03-31: 150 ug/kg/min via INTRAVENOUS

## 2022-03-31 MED ORDER — OXYCODONE HCL 5 MG PO TABS
10.0000 mg | ORAL_TABLET | ORAL | Status: DC | PRN
  Administered 2022-03-31 – 2022-04-01 (×2): 10 mg via ORAL

## 2022-03-31 MED ORDER — MIDAZOLAM HCL 2 MG/2ML IJ SOLN
2.0000 mg | Freq: Once | INTRAMUSCULAR | Status: AC
  Administered 2022-03-31: 2 mg via INTRAVENOUS
  Filled 2022-03-31: qty 2

## 2022-03-31 MED ORDER — DEXAMETHASONE SODIUM PHOSPHATE 10 MG/ML IJ SOLN
INTRAMUSCULAR | Status: AC
  Filled 2022-03-31: qty 1

## 2022-03-31 MED ORDER — LACTATED RINGERS IV SOLN: INTRAVENOUS | Status: DC

## 2022-03-31 MED ORDER — METHOCARBAMOL 500 MG PO TABS
500.0000 mg | ORAL_TABLET | Freq: Four times a day (QID) | ORAL | Status: DC | PRN
  Administered 2022-03-31 – 2022-04-01 (×3): 500 mg via ORAL
  Filled 2022-03-31 (×3): qty 1

## 2022-03-31 MED ORDER — POVIDONE-IODINE 10 % EX SWAB: Freq: Once | CUTANEOUS | Status: AC

## 2022-03-31 MED ORDER — SODIUM CHLORIDE 0.9 % IV SOLN: INTRAVENOUS | Status: DC

## 2022-03-31 MED ORDER — TRANEXAMIC ACID-NACL 1000-0.7 MG/100ML-% IV SOLN
INTRAVENOUS | Status: AC
  Filled 2022-03-31: qty 100

## 2022-03-31 MED ORDER — HYDROMORPHONE HCL 1 MG/ML IJ SOLN: 0.5000 mg | INTRAMUSCULAR | Status: DC | PRN

## 2022-03-31 MED ORDER — OXYCODONE HCL 5 MG PO TABS
5.0000 mg | ORAL_TABLET | Freq: Once | ORAL | Status: AC | PRN
  Administered 2022-03-31: 5 mg via ORAL

## 2022-03-31 MED ORDER — OXYCODONE HCL 5 MG PO TABS
5.0000 mg | ORAL_TABLET | ORAL | Status: DC | PRN
  Administered 2022-03-31 – 2022-04-01 (×2): 10 mg via ORAL
  Filled 2022-03-31 (×4): qty 2

## 2022-03-31 MED ORDER — IRRISEPT - 450ML BOTTLE WITH 0.05% CHG IN STERILE WATER, USP 99.95% OPTIME
TOPICAL | Status: DC | PRN
  Administered 2022-03-31: 450 mL via TOPICAL

## 2022-03-31 MED ORDER — ONDANSETRON HCL 4 MG/2ML IJ SOLN: 4.0000 mg | Freq: Four times a day (QID) | INTRAMUSCULAR | Status: DC | PRN

## 2022-03-31 MED ORDER — OXYCODONE HCL ER 10 MG PO T12A
10.0000 mg | EXTENDED_RELEASE_TABLET | Freq: Two times a day (BID) | ORAL | Status: DC
  Administered 2022-03-31 – 2022-04-01 (×2): 10 mg via ORAL
  Filled 2022-03-31 (×2): qty 1

## 2022-03-31 MED ORDER — ASPIRIN 81 MG PO CHEW
81.0000 mg | CHEWABLE_TABLET | Freq: Two times a day (BID) | ORAL | Status: DC
  Administered 2022-03-31 – 2022-04-01 (×2): 81 mg via ORAL
  Filled 2022-03-31 (×2): qty 1

## 2022-03-31 MED ORDER — FENTANYL CITRATE (PF) 100 MCG/2ML IJ SOLN
INTRAMUSCULAR | Status: AC
  Filled 2022-03-31: qty 2

## 2022-03-31 MED ORDER — PERFLUTREN LIPID MICROSPHERE
1.0000 mL | INTRAVENOUS | Status: AC | PRN
  Administered 2022-03-31: 5 mL via INTRAVENOUS
  Filled 2022-03-31: qty 10

## 2022-03-31 MED ORDER — BUPROPION HCL ER (XL) 300 MG PO TB24
300.0000 mg | ORAL_TABLET | Freq: Every morning | ORAL | Status: DC
  Administered 2022-04-01: 300 mg via ORAL
  Filled 2022-03-31: qty 1

## 2022-03-31 MED ORDER — METOCLOPRAMIDE HCL 5 MG PO TABS: 5.0000 mg | ORAL_TABLET | Freq: Three times a day (TID) | ORAL | Status: DC | PRN

## 2022-03-31 MED ORDER — PHENYLEPHRINE 80 MCG/ML (10ML) SYRINGE FOR IV PUSH (FOR BLOOD PRESSURE SUPPORT)
PREFILLED_SYRINGE | INTRAVENOUS | Status: AC
  Filled 2022-03-31: qty 10

## 2022-03-31 MED ORDER — METHOCARBAMOL 1000 MG/10ML IJ SOLN: 500.0000 mg | Freq: Four times a day (QID) | INTRAVENOUS | Status: DC | PRN

## 2022-03-31 MED ORDER — ACETAMINOPHEN 325 MG PO TABS: 325.0000 mg | ORAL_TABLET | Freq: Four times a day (QID) | ORAL | Status: DC | PRN

## 2022-03-31 MED ORDER — OXYCODONE HCL 5 MG PO TABS
ORAL_TABLET | ORAL | Status: AC
  Filled 2022-03-31: qty 1

## 2022-03-31 MED ORDER — TRANEXAMIC ACID-NACL 1000-0.7 MG/100ML-% IV SOLN
1000.0000 mg | Freq: Once | INTRAVENOUS | Status: AC
  Administered 2022-03-31: 1000 mg via INTRAVENOUS
  Filled 2022-03-31: qty 100

## 2022-03-31 MED ORDER — DOCUSATE SODIUM 100 MG PO CAPS
100.0000 mg | ORAL_CAPSULE | Freq: Two times a day (BID) | ORAL | Status: DC
  Administered 2022-03-31 – 2022-04-01 (×2): 100 mg via ORAL
  Filled 2022-03-31 (×2): qty 1

## 2022-03-31 MED ORDER — ONDANSETRON HCL 4 MG/2ML IJ SOLN
INTRAMUSCULAR | Status: AC
  Filled 2022-03-31: qty 2

## 2022-03-31 MED ORDER — FERROUS SULFATE 325 (65 FE) MG PO TABS
325.0000 mg | ORAL_TABLET | Freq: Three times a day (TID) | ORAL | Status: DC
  Administered 2022-03-31 – 2022-04-01 (×2): 325 mg via ORAL
  Filled 2022-03-31 (×2): qty 1

## 2022-03-31 MED ORDER — METFORMIN HCL 500 MG PO TABS: 500.0000 mg | ORAL_TABLET | Freq: Two times a day (BID) | ORAL | Status: DC

## 2022-03-31 MED ORDER — ORAL CARE MOUTH RINSE: 15.0000 mL | Freq: Once | OROMUCOSAL | Status: AC

## 2022-03-31 MED ORDER — CHLORHEXIDINE GLUCONATE 0.12 % MT SOLN: 15.0000 mL | Freq: Once | OROMUCOSAL | Status: AC

## 2022-03-31 MED ORDER — AMITRIPTYLINE HCL 50 MG PO TABS
150.0000 mg | ORAL_TABLET | Freq: Every day | ORAL | Status: DC
  Filled 2022-03-31: qty 3
  Filled 2022-03-31: qty 2

## 2022-03-31 MED ORDER — TRANEXAMIC ACID 1000 MG/10ML IV SOLN
INTRAVENOUS | Status: DC | PRN
  Administered 2022-03-31: 2000 mg via TOPICAL

## 2022-03-31 MED ORDER — FENTANYL CITRATE (PF) 100 MCG/2ML IJ SOLN
INTRAMUSCULAR | Status: AC
  Administered 2022-03-31: 50 ug
  Filled 2022-03-31: qty 2

## 2022-03-31 MED ORDER — MENTHOL 3 MG MT LOZG: 1.0000 | LOZENGE | OROMUCOSAL | Status: DC | PRN

## 2022-03-31 MED ORDER — DEXAMETHASONE SODIUM PHOSPHATE 10 MG/ML IJ SOLN
INTRAMUSCULAR | Status: DC | PRN
  Administered 2022-03-31: 4 mg via INTRAVENOUS

## 2022-03-31 MED ORDER — BUPIVACAINE-MELOXICAM ER 400-12 MG/14ML IJ SOLN
INTRAMUSCULAR | Status: DC | PRN
  Administered 2022-03-31: 400 mg

## 2022-03-31 MED ORDER — ONDANSETRON HCL 4 MG PO TABS: 4.0000 mg | ORAL_TABLET | Freq: Four times a day (QID) | ORAL | Status: DC | PRN

## 2022-03-31 MED ORDER — VANCOMYCIN HCL 1000 MG IV SOLR
INTRAVENOUS | Status: DC | PRN
  Administered 2022-03-31: 1000 mg via TOPICAL

## 2022-03-31 MED ORDER — FENTANYL CITRATE (PF) 100 MCG/2ML IJ SOLN
25.0000 ug | INTRAMUSCULAR | Status: DC | PRN
  Administered 2022-03-31: 50 ug via INTRAVENOUS

## 2022-03-31 MED ORDER — TRANEXAMIC ACID-NACL 1000-0.7 MG/100ML-% IV SOLN
1000.0000 mg | INTRAVENOUS | Status: AC
  Administered 2022-03-31: 1000 mg via INTRAVENOUS

## 2022-03-31 MED ORDER — FENTANYL CITRATE PF 50 MCG/ML IJ SOSY
50.0000 ug | PREFILLED_SYRINGE | Freq: Once | INTRAMUSCULAR | Status: AC
  Filled 2022-03-31: qty 1

## 2022-03-31 MED ORDER — CEFAZOLIN SODIUM-DEXTROSE 2-4 GM/100ML-% IV SOLN
2.0000 g | Freq: Four times a day (QID) | INTRAVENOUS | Status: AC
  Administered 2022-03-31 (×2): 2 g via INTRAVENOUS
  Filled 2022-03-31 (×2): qty 100

## 2022-03-31 MED ORDER — CHLORHEXIDINE GLUCONATE 0.12 % MT SOLN
OROMUCOSAL | Status: AC
  Administered 2022-03-31: 15 mL via OROMUCOSAL
  Filled 2022-03-31: qty 15

## 2022-03-31 MED ORDER — 0.9 % SODIUM CHLORIDE (POUR BTL) OPTIME
TOPICAL | Status: DC | PRN
  Administered 2022-03-31: 1000 mL

## 2022-03-31 MED ORDER — PROPOFOL 10 MG/ML IV BOLUS
INTRAVENOUS | Status: DC | PRN
  Administered 2022-03-31: 10 mg via INTRAVENOUS
  Administered 2022-03-31: 20 mg via INTRAVENOUS
  Administered 2022-03-31: 10 mg via INTRAVENOUS

## 2022-03-31 MED ORDER — PHENYLEPHRINE 80 MCG/ML (10ML) SYRINGE FOR IV PUSH (FOR BLOOD PRESSURE SUPPORT)
PREFILLED_SYRINGE | INTRAVENOUS | Status: DC | PRN
  Administered 2022-03-31 (×9): 80 ug via INTRAVENOUS

## 2022-03-31 MED ORDER — INSULIN ASPART 100 UNIT/ML IJ SOLN: 0.0000 [IU] | INTRAMUSCULAR | Status: DC | PRN

## 2022-03-31 MED ORDER — DEXAMETHASONE SODIUM PHOSPHATE 10 MG/ML IJ SOLN
10.0000 mg | Freq: Once | INTRAMUSCULAR | Status: AC
  Administered 2022-04-01: 10 mg via INTRAVENOUS
  Filled 2022-03-31: qty 1

## 2022-03-31 SURGICAL SUPPLY — 80 items
ALCOHOL 70% 16 OZ (MISCELLANEOUS) ×2 IMPLANT
BAG COUNTER SPONGE SURGICOUNT (BAG) IMPLANT
BAG DECANTER FOR FLEXI CONT (MISCELLANEOUS) ×2 IMPLANT
BANDAGE ESMARK 6X9 LF (GAUZE/BANDAGES/DRESSINGS) IMPLANT
BLADE SAG 18X100X1.27 (BLADE) ×2 IMPLANT
BNDG ESMARK 6X9 LF (GAUZE/BANDAGES/DRESSINGS)
BOWL SMART MIX CTS (DISPOSABLE) ×2 IMPLANT
CLSR STERI-STRIP ANTIMIC 1/2X4 (GAUZE/BANDAGES/DRESSINGS) ×4 IMPLANT
COOLER ICEMAN CLASSIC (MISCELLANEOUS) ×2 IMPLANT
COVER SURGICAL LIGHT HANDLE (MISCELLANEOUS) ×2 IMPLANT
CUFF TOURN SGL QUICK 34 (TOURNIQUET CUFF) ×2
CUFF TOURN SGL QUICK 42 (TOURNIQUET CUFF) IMPLANT
CUFF TRNQT CYL 34X4.125X (TOURNIQUET CUFF) ×1 IMPLANT
DERMABOND ADVANCED (GAUZE/BANDAGES/DRESSINGS) ×1
DERMABOND ADVANCED .7 DNX12 (GAUZE/BANDAGES/DRESSINGS) ×1 IMPLANT
DRAPE EXTREMITY T 121X128X90 (DISPOSABLE) ×2 IMPLANT
DRAPE HALF SHEET 40X57 (DRAPES) ×2 IMPLANT
DRAPE INCISE IOBAN 66X45 STRL (DRAPES) ×2 IMPLANT
DRAPE ORTHO SPLIT 77X108 STRL (DRAPES) ×4
DRAPE POUCH INSTRU U-SHP 10X18 (DRAPES) ×2 IMPLANT
DRAPE SURG ORHT 6 SPLT 77X108 (DRAPES) ×2 IMPLANT
DRAPE U-SHAPE 47X51 STRL (DRAPES) ×4 IMPLANT
DRSG AQUACEL AG ADV 3.5X10 (GAUZE/BANDAGES/DRESSINGS) ×2 IMPLANT
DURAPREP 26ML APPLICATOR (WOUND CARE) ×6 IMPLANT
ELECT CAUTERY BLADE 6.4 (BLADE) ×2 IMPLANT
ELECT REM PT RETURN 9FT ADLT (ELECTROSURGICAL) ×2
ELECTRODE REM PT RTRN 9FT ADLT (ELECTROSURGICAL) ×1 IMPLANT
GLOVE BIOGEL PI IND STRL 7.0 (GLOVE) ×1 IMPLANT
GLOVE BIOGEL PI IND STRL 7.5 (GLOVE) ×4 IMPLANT
GLOVE BIOGEL PI INDICATOR 7.0 (GLOVE) ×1
GLOVE BIOGEL PI INDICATOR 7.5 (GLOVE) ×4
GLOVE ECLIPSE 7.0 STRL STRAW (GLOVE) ×6 IMPLANT
GLOVE SKINSENSE NS SZ7.5 (GLOVE) ×3
GLOVE SKINSENSE STRL SZ7.5 (GLOVE) ×3 IMPLANT
GLOVE SURG UNDER LTX SZ7.5 (GLOVE) ×4 IMPLANT
GLOVE SURG UNDER POLY LF SZ7 (GLOVE) ×4 IMPLANT
GOWN STRL REIN XL XLG (GOWN DISPOSABLE) ×2 IMPLANT
GOWN STRL REUS W/ TWL LRG LVL3 (GOWN DISPOSABLE) ×1 IMPLANT
GOWN STRL REUS W/TWL LRG LVL3 (GOWN DISPOSABLE) ×2
HANDPIECE INTERPULSE COAX TIP (DISPOSABLE) ×2
HDLS TROCR DRIL PIN KNEE 75 (PIN) ×2
HOOD PEEL AWAY FLYTE STAYCOOL (MISCELLANEOUS) ×4 IMPLANT
IMPL PATELLA METAL SZ32X10 (Joint) ×1 IMPLANT
INSERT TIB ARTISURF SZ8-11X12 (Insert) ×1 IMPLANT
JET LAVAGE IRRISEPT WOUND (IRRIGATION / IRRIGATOR) ×2
KIT BASIN OR (CUSTOM PROCEDURE TRAY) ×2 IMPLANT
KIT TURNOVER KIT B (KITS) ×2 IMPLANT
LAVAGE JET IRRISEPT WOUND (IRRIGATION / IRRIGATOR) ×1 IMPLANT
MANIFOLD NEPTUNE II (INSTRUMENTS) ×2 IMPLANT
MARKER SKIN DUAL TIP RULER LAB (MISCELLANEOUS) ×4 IMPLANT
NDL SPNL 18GX3.5 QUINCKE PK (NEEDLE) ×1 IMPLANT
NEEDLE SPNL 18GX3.5 QUINCKE PK (NEEDLE) ×2 IMPLANT
NS IRRIG 1000ML POUR BTL (IV SOLUTION) ×2 IMPLANT
PACK TOTAL JOINT (CUSTOM PROCEDURE TRAY) ×2 IMPLANT
PAD ARMBOARD 7.5X6 YLW CONV (MISCELLANEOUS) ×4 IMPLANT
PAD COLD SHLDR WRAP-ON (PAD) ×2 IMPLANT
PIN DRILL HDLS TROCAR 75 4PK (PIN) IMPLANT
PROS FEM KNEE PS STD 9 RT (Joint) ×2 IMPLANT
PROS TIB KNEE PS 0D F RT (Joint) ×2 IMPLANT
PROSTHESIS FEM KNEE PS STD9 RT (Joint) IMPLANT
PROSTHESIS TIB KNEE PS 0D F RT (Joint) IMPLANT
SAW OSC TIP CART 19.5X105X1.3 (SAW) ×2 IMPLANT
SCREW FEMALE HEX FIX 25X2.5 (ORTHOPEDIC DISPOSABLE SUPPLIES) ×1 IMPLANT
SET HNDPC FAN SPRY TIP SCT (DISPOSABLE) ×1 IMPLANT
STAPLER VISISTAT 35W (STAPLE) IMPLANT
SUCTION FRAZIER HANDLE 10FR (MISCELLANEOUS) ×2
SUCTION TUBE FRAZIER 10FR DISP (MISCELLANEOUS) ×1 IMPLANT
SUT ETHILON 2 0 FS 18 (SUTURE) ×2 IMPLANT
SUT MNCRL AB 3-0 PS2 27 (SUTURE) IMPLANT
SUT VIC AB 0 CT1 27 (SUTURE) ×4
SUT VIC AB 0 CT1 27XBRD ANBCTR (SUTURE) ×2 IMPLANT
SUT VIC AB 1 CTX 27 (SUTURE) ×6 IMPLANT
SUT VIC AB 2-0 CT1 27 (SUTURE) ×8
SUT VIC AB 2-0 CT1 TAPERPNT 27 (SUTURE) ×4 IMPLANT
SYR 50ML LL SCALE MARK (SYRINGE) ×3 IMPLANT
TOWEL GREEN STERILE (TOWEL DISPOSABLE) ×2 IMPLANT
TOWEL GREEN STERILE FF (TOWEL DISPOSABLE) ×2 IMPLANT
TRAY CATH 16FR W/PLASTIC CATH (SET/KITS/TRAYS/PACK) ×1 IMPLANT
UNDERPAD 30X36 HEAVY ABSORB (UNDERPADS AND DIAPERS) ×2 IMPLANT
YANKAUER SUCT BULB TIP NO VENT (SUCTIONS) ×4 IMPLANT

## 2022-03-31 NOTE — Op Note (Signed)
Total Knee Arthroplasty Procedure Note  Preoperative diagnosis: Right knee osteoarthritis  Postoperative diagnosis:same  Operative procedure: Right total knee arthroplasty. CPT [phone]  Surgeon: N. Eduard Roux, MD  Assist: Madalyn Rob, PA-C; necessary for the timely completion of procedure and due to complexity of procedure.  Anesthesia: Spinal, regional, local  Tourniquet time: see anesthesia record  Implants used: Zimmer persona pressfit Femur: CR 9 Tibia: F Patella: 32 mm Polyethylene: 12 mm, MC  Indication: Christina [name] is a 56 y.o. year old female with a history of knee pain. Having failed conservative management, the patient elected to proceed with a total knee arthroplasty.  We have reviewed the risk and benefits of the surgery and they elected to proceed after voicing understanding.  Procedure:  After informed consent was obtained and understanding of the risk were voiced including but not limited to bleeding, infection, damage to surrounding structures including nerves and vessels, blood clots, leg length inequality and the failure to achieve desired results, the operative extremity was marked with verbal confirmation of the patient in the holding area.   The patient was then brought to the operating room and transported to the operating room table in the supine position.  A tourniquet was applied to the operative extremity around the upper thigh. The operative limb was then prepped and draped in the usual sterile fashion and preoperative antibiotics were administered.  A time out was performed prior to the start of surgery confirming the correct extremity, preoperative antibiotic administration, as well as team members, implants and instruments available for the case. Correct surgical site was also confirmed with preoperative radiographs. The limb was then elevated for exsanguination and the tourniquet was inflated. A midline incision was made and a standard  medial parapatellar approach was performed.  The infrapatellar fat pad was removed.  Suprapatellar synovium was removed to reveal the anterior distal femoral cortex.  A medial peel was performed to release the capsule of the medial tibial plateau.  The patella was then everted and was prepared and sized to a 32 mm.  A cover was placed on the patella for protection from retractors.  The knee was then brought into full flexion and we then turned our attention to the femur.  The cruciates were sacrificed.  Start site was drilled in the femur and the intramedullary distal femoral cutting guide was placed, set at 5 degrees valgus, taking 10 mm of distal resection. The distal cut was made. Osteophytes were then removed.  Next, the proximal tibial cutting guide was placed with appropriate slope, varus/valgus alignment and depth of resection. The proximal tibial cut was made taking 4 mm off the low side. Gap blocks were then used to assess the extension gap and alignment, and appropriate soft tissue releases were performed. Attention was turned back to the femur, which was sized using the sizing guide to a size 9. Appropriate rotation of the femoral component was determined using epicondylar axis, Whiteside's line, and assessing the flexion gap under ligament tension. The appropriate size 4-in-1 cutting block was placed and checked with an angel wing and cuts were made. Posterior femoral osteophytes and uncapped bone were then removed with the curved osteotome.  Trial components were placed, and stability was checked in full extension, mid-flexion, and deep flexion. Proper tibial rotation was determined and marked.  The patella tracked well without a lateral release.  The femoral lugs were then drilled. Trial components were then removed and tibial preparation performed.  The tibia was sized for  a size F component.   The bony surfaces were irrigated with a pulse lavage and then dried. Bone cement was vacuum mixed on the  back table, and the final components sized above were cemented into place.  Antibiotic irrigation was placed in the knee joint and soft tissues while the cement cured.  After cement had finished curing, excess cement was removed. The stability of the construct was re-evaluated throughout a range of motion and found to be acceptable. The trial liner was removed, the knee was copiously irrigated, and the knee was re-evaluated for any excess bone debris. The real polyethylene liner, 12 mm thick, was inserted and checked to ensure the locking mechanism had engaged appropriately. The tourniquet was deflated and hemostasis was achieved. The wound was irrigated with normal saline.  One gram of vancomycin powder was placed in the surgical bed.  Topical 0.25% bupivacaine and meloxicam was placed in the joint for postoperative pain.  Capsular closure was performed with a #1 vicryl, subcutaneous fat closed with a 0 vicryl suture, then subcutaneous tissue closed with interrupted 2.0 vicryl suture. The skin was then closed with a 2.0 nylon and dermabond. A sterile dressing was applied.  The patient was awakened in the operating room and taken to recovery in stable condition. All sponge, needle, and instrument counts were correct at the end of the case.  Tawanna Cooler was necessary for opening, closing, retracting, limb positioning and overall facilitation and completion of the surgery.  Position: supine  Complications: none.  Time Out: performed   Drains/Packing: none  Estimated blood loss: minimal  Returned to Recovery Room: in good condition.   Antibiotics: yes   Mechanical VTE (DVT) Prophylaxis: sequential compression devices, TED thigh-high  Chemical VTE (DVT) Prophylaxis: aspirin  Fluid Replacement  Crystalloid: see anesthesia record Blood: none  FFP: none   Specimens Removed: 1 to pathology   Sponge and Instrument Count Correct? yes   PACU: portable radiograph - knee AP and Lateral    Plan/RTC: Return in 2 weeks for wound check.   Weight Bearing/Load Lower Extremity: full   Implant Name Type Inv. Item Serial No. Manufacturer Lot No. LRB No. Used Action  IMPL PATELLA METAL [id number] - [id number] Joint IMPL PATELLA METAL [id number]  [employer](ORTH,TRAU,BIO,SG) [id number] Right 1 Implanted  PROS FEM KNEE PS STD 9 RT - [id number] Joint PROS FEM KNEE PS STD 9 RT  [employer]ON(ORTH,TRAU,BIO,SG) [id number] Right 1 Implanted  PROS TIB KNEE PS 0D F RT - [id number] Joint PROS TIB KNEE PS 0D F RT  [employer]ON(ORTH,TRAU,BIO,SG) [id number] Right 1 Implanted  INSERT TIB ARTISURF [id number] - [id number] Insert INSERT TIB ARTISURF [id number]  [employer](ORTH,TRAU,BIO,SG) [id number] Right 1 Implanted    N. Eduard Roux, MD [facility] 1:17 PM

## 2022-03-31 NOTE — Anesthesia Procedure Notes (Signed)
Anesthesia Regional Block: Adductor canal block   Pre-Anesthetic Checklist: , timeout performed,  Correct Patient, Correct Site, Correct Laterality,  Correct Procedure, Correct Position, site marked,  Risks and benefits discussed,  Surgical consent,  Pre-op evaluation,  At surgeon's request and post-op pain management  Laterality: Right  Prep: chloraprep       Needles:  Injection technique: Single-shot  Needle Type: Echogenic Needle     Needle Length: 9cm  Needle Gauge: 21     Additional Needles:   Narrative:  Start time: 03/31/2022 10:03 AM End time: 03/31/2022 11:03 AM Injection made incrementally with aspirations every 5 mL.  Performed by: Personally  Anesthesiologist: Albertha Ghee, MD  Additional Notes: Pt tolerated the procedure well.

## 2022-03-31 NOTE — Progress Notes (Signed)
Echocardiogram 2D Echocardiogram has been performed.  [place] Deputy Deanda Ruddell RDCS 03/31/2022, 9:37 AM  Dr. Marcie Bal notified of echo by secure chat

## 2022-03-31 NOTE — Transfer of Care (Signed)
Immediate Anesthesia Transfer of Care Note  Patient: Christina [name]  Procedure(s) Performed: RIGHT TOTAL KNEE REPLACEMENT (Right: Knee)  Patient Location: PACU  Anesthesia Type:MAC and Spinal  Level of Consciousness: drowsy and patient cooperative  Airway & Oxygen Therapy: Patient Spontanous Breathing and Patient connected to face mask oxygen  Post-op Assessment: Report given to RN and Post -op Vital signs reviewed and stable  Post vital signs: Reviewed and stable  Last Vitals:  Vitals Value Taken Time  BP 102/64 03/31/22 1403  Temp    Pulse 62 03/31/22 1406  Resp 25 03/31/22 1406  SpO2 95 % 03/31/22 1406  Vitals shown include unvalidated device data.  Last Pain:  Vitals:   03/31/22 0927  TempSrc:   PainSc: 0-No pain         Complications: No notable events documented.

## 2022-03-31 NOTE — Evaluation (Signed)
Physical Therapy Evaluation Patient Details Name: Christina [name] MRN: [id number] DOB: 1966/01/09 Today's Date: 03/31/2022  History of Present Illness  Pt is a 56 y.o. F who presents s/p R TKA 03/31/2022. Significant PMH: DM2, depression.  Clinical Impression  Pt admitted s/p R TKA. Presents with fair pain control and is overall mobilizing well POD #0. Initiated reviewing HEP and provided written handout. Pt ambulating 80 ft with a walker at a min guard assist level, utilizing a step to pattern. Suspect excellent progress given pain control/management.      Recommendations for follow up therapy are one component of a multi-disciplinary discharge planning process, led by the attending physician.  Recommendations may be updated based on patient status, additional functional criteria and insurance authorization.  Follow Up Recommendations Follow physician's recommendations for discharge plan and follow up therapies      Assistance Recommended at Discharge PRN  Patient can return home with the following  Assistance with cooking/housework;Assist for transportation;Help with stairs or ramp for entrance    Equipment Recommendations None recommended by PT (pt equipped)  Recommendations for Other Services       Functional Status Assessment Patient has had a recent decline in their functional status and demonstrates the ability to make significant improvements in function in a reasonable and predictable amount of time.     Precautions / Restrictions Precautions Precautions: Fall Restrictions Weight Bearing Restrictions: No      Mobility  Bed Mobility Overal bed mobility: Needs Assistance Bed Mobility: Supine to Sit     Supine to sit: Supervision     General bed mobility comments: no physical assist required    Transfers Overall transfer level: Needs assistance Equipment used: Rolling walker (2 wheels) Transfers: Sit to/from Stand Sit to Stand: Min guard           General  transfer comment: min guard for safety, cues for scooting out anteriorly    Ambulation/Gait Ambulation/Gait assistance: Min guard Gait Distance (Feet): 80 Feet Assistive device: Rolling walker (2 wheels) Gait Pattern/deviations: Step-to pattern, Decreased stance time - right, Decreased weight shift to right Gait velocity: decreased     General Gait Details: Step to pattern, cues for walker use and sequencing, min guard for safety  Stairs            Wheelchair Mobility    Modified Rankin (Stroke Patients Only)       Balance Overall balance assessment: Needs assistance Sitting-balance support: Feet supported Sitting balance-Leahy Scale: Good     Standing balance support: Bilateral upper extremity supported Standing balance-Leahy Scale: Fair                               Pertinent Vitals/Pain Pain Assessment Pain Assessment: Faces Faces Pain Scale: Hurts even more Pain Location: R knee Pain Descriptors / Indicators: Discomfort, Grimacing, Operative site guarding Pain Intervention(s): Monitored during session, Patient requesting pain meds-RN notified    Home Living Family/patient expects to be discharged to:: Private residence Living Arrangements: Spouse/significant other;Children Available Help at Discharge: Family;Available 24 hours/day Type of Home: House Home Access: Level entry       Home Layout: One level Home Equipment: Conservation officer, nature (2 wheels);Toilet riser      Prior Function Prior Level of Function : Independent/Modified Independent             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)       Journalist, newspaper  Extremity/Trunk Assessment   Upper Extremity Assessment Upper Extremity Assessment: Overall WFL for tasks assessed    Lower Extremity Assessment Lower Extremity Assessment: RLE deficits/detail RLE Deficits / Details: s/p TKA. Grossly 3-/5 strength       Communication   Communication: No  difficulties  Cognition Arousal/Alertness: Awake/alert Behavior During Therapy: WFL for tasks assessed/performed Overall Cognitive Status: Within Functional Limits for tasks assessed                                          General Comments      Exercises Total Joint Exercises Ankle Circles/Pumps: Right, 20 reps, Supine Quad Sets: Right, 10 reps, Supine Long Arc Quad: Right, 10 reps, Seated   Assessment/Plan    PT Assessment Patient needs continued PT services  PT Problem List Decreased strength;Decreased activity tolerance;Decreased range of motion;Decreased balance;Decreased mobility;Pain       PT Treatment Interventions DME instruction;Stair training;Gait training;Functional mobility training;Therapeutic activities;Therapeutic exercise;Balance training;Patient/family education    PT Goals (Current goals can be found in the Care Plan section)  Acute Rehab PT Goals Patient Stated Goal: did not state PT Goal Formulation: With patient Time For Goal Achievement: 04/14/22 Potential to Achieve Goals: Good    Frequency 7X/week     Co-evaluation               AM-PAC PT "6 Clicks" Mobility  Outcome Measure Help needed turning from your back to your side while in a flat bed without using bedrails?: None Help needed moving from lying on your back to sitting on the side of a flat bed without using bedrails?: A Little Help needed moving to and from a bed to a chair (including a wheelchair)?: A Little Help needed standing up from a chair using your arms (e.g., wheelchair or bedside chair)?: A Little Help needed to walk in hospital room?: A Little Help needed climbing 3-5 steps with a railing? : A Little 6 Click Score: 19    End of Session Equipment Utilized During Treatment: Gait belt Activity Tolerance: Patient tolerated treatment well Patient left: in chair;with call bell/phone within reach Nurse Communication: Mobility status;Patient requests pain  meds PT Visit Diagnosis: Other abnormalities of gait and mobility (R26.89);Pain Pain - Right/Left: Right Pain - part of body: Knee    Time: 6811-5726 PT Time Calculation (min) (ACUTE ONLY): 17 min   Charges:   PT Evaluation $PT Eval Low Complexity: [place], PT, DPT Acute Rehabilitation Services Office [phone]   Deno Etienne 03/31/2022, 4:49 PM

## 2022-03-31 NOTE — H&P (Signed)
PREOPERATIVE H&P  Chief Complaint: right knee osteoarthritis  HPI: Christina [name] is a 56 y.o. female who presents for surgical treatment of right knee osteoarthritis.  She denies any changes in medical history.  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,  multifocal IDC, DCIS, ER/PR+,  01-09-2016 s/p bilteral mastectomies w/ left sln bx;  no chemoradiation   Cataracts, bilateral    Depression    Diabetes mellitus without complication ([place])    type 2   GAD (generalized anxiety disorder)    Gallbladder problem    History of ovarian cyst    IBS (irritable bowel syndrome)    Joint pain    PONV (postoperative nausea and vomiting)    does well with scop patch   Retinal detachment    Rheg OS   Right knee meniscal tear    Urgency of urination    Past Surgical History:  Procedure Laterality Date   ABDOMINAL HYSTERECTOMY  05/1996   endometriosis   ACHILLES TENDON REPAIR Right 2010;  revision 2011   BLADDER SUSPENSION  2000   BREAST BIOPSY Left 10/2015   BREAST IMPLANT REMOVAL Bilateral 08/12/2017   Procedure: REMOVAL BILATERAL BREAST IMPLANTS;  Surgeon: Wallace Going, DO;  Location: [place];  Service: Plastics;  Laterality: Bilateral;   BREAST RECONSTRUCTION WITH PLACEMENT OF TISSUE EXPANDER AND FLEX HD (ACELLULAR HYDRATED DERMIS) Bilateral 01/09/2016   BREAST RECONSTRUCTION WITH PLACEMENT OF TISSUE EXPANDER AND FLEX HD (ACELLULAR HYDRATED DERMIS) Bilateral 01/09/2016   Procedure: BREAST RECONSTRUCTION WITH PLACEMENT OF TISSUE EXPANDER AND FLEX HD (ACELLULAR HYDRATED DERMIS);  Surgeon: Loel Lofty Dillingham, DO;  Location: [place];  Service: Plastics;  Laterality: Bilateral;   BREAST RECONSTRUCTION WITH PLACEMENT OF TISSUE EXPANDER AND FLEX HD (ACELLULAR HYDRATED DERMIS) Bilateral 05/29/2016   Procedure: PLACEMENT OF BILATERAL TISSUE  EXPANDER AND FLEX HD (ACELLULAR HYDRATED DERMIS);  Surgeon: Wallace Going, DO;  Location: [place];  Service: Plastics;  Laterality: Bilateral;   BREAST REDUCTION SURGERY Bilateral 11/26/2016   Procedure: BILATERAL BREAST CAPSULE CONTRACTURE RELASE;  Surgeon: Wallace Going, DO;  Location: [place];  Service: Plastics;  Laterality: Bilateral;   [place]; 1989; [place] Right x3   last one 02-01-2019 @ [facility]   EYE SURGERY Left 10/01/2020   Pneumatic retinopexy for repair of rheg RD - Dr. Bernarda Caffey   EYE SURGERY Left 10/04/2020   PPV - Dr. Bernarda Caffey   FAT GRAFTING BILATERAL BREAST  08-09-2018  '@[facility]'$    GAS INSERTION Left 10/04/2020   Procedure: INSERTION OF GAS;  Surgeon: Bernarda Caffey, MD;  Location: [place];  Service: Ophthalmology;  Laterality: Left;   GAS/FLUID EXCHANGE Left 10/04/2020   Procedure: GAS/FLUID EXCHANGE;  Surgeon: Bernarda Caffey, MD;  Location: [place];  Service: Ophthalmology;  Laterality: Left;   INCISION AND DRAINAGE OF WOUND Bilateral 02/11/2016   Procedure: IRRIGATION AND DEBRIDEMENT OF BILATERAL BREAST POCKET;  Surgeon: Wallace Going, DO;  Location: [place];  Service: Plastics;  Laterality: Bilateral;   KNEE ARTHROSCOPY WITH MEDIAL MENISECTOMY Right 12/20/2019   Procedure: KNEE ARTHROSCOPY WITH MEDIAL MENISECTOMY;  Surgeon: Marchia Bond, MD;  Location: [place];  Service: Orthopedics;  Laterality: Right;   KNEE ARTHROSCOPY WITH MEDIAL MENISECTOMY Right 10/30/2021  Procedure: RIGHT KNEE ARTHROSCOPY WITH PARTIAL MEDIAL MENISECTOMY SYNOVECTOMY;  Surgeon: Leandrew Koyanagi, MD;  Location: [place];  Service: Orthopedics;  Laterality: Right;   LAPAROSCOPIC APPENDECTOMY  04-07-2011   '@[facility]'$    w/ Excision peritoneal lipoma and lysis adhesions   LAPAROSCOPIC CHOLECYSTECTOMY  ~ [place] Right 10/04/2020   Procedure: LASER RETINOPEXY WITH  INDIRECT LASER OPTHALMOSCOPE, RIGHT EYE;  Surgeon: Bernarda Caffey, MD;  Location: [place];  Service: Ophthalmology;  Laterality: Right;   LIPOSUCTION WITH LIPOFILLING Bilateral 11/26/2016   Procedure: LIPOFILLING FOR SYMMETRY;  Surgeon: Wallace Going, DO;  Location: [place];  Service: Plastics;  Laterality: Bilateral;   LIPOSUCTION WITH LIPOFILLING Bilateral 01/21/2017   Procedure: BILATERAL BREAST  LIPOFILLING FOR ASYMMETRY;  Surgeon: Wallace Going, DO;  Location: [place];  Service: Plastics;  Laterality: Bilateral;   LIPOSUCTION WITH LIPOFILLING Bilateral 06/28/2020   Procedure: Lipofilling bilateral breasts for asymmetry;  Surgeon: Wallace Going, DO;  Location: [place];  Service: Plastics;  Laterality: Bilateral;  90 min   MASTECTOMY Bilateral 01/09/2016   NIPPLE SPARING MASTECTOMY/SENTINAL LYMPH NODE BIOPSY/RECONSTRUCTION/PLACEMENT OF TISSUE EXPANDER Bilateral 01/09/2016   Procedure: BILATERAL NIPPLE SPARING MASTECTOMY WITH LEFT SENTINAL LYMPH NODE BIOPSY ;  Surgeon: Stark Klein, MD;  Location: [place];  Service: General;  Laterality: Bilateral;   PHOTOCOAGULATION WITH LASER Left 10/04/2020   Procedure: PHOTOCOAGULATION WITH LASER;  Surgeon: Bernarda Caffey, MD;  Location: [place];  Service: Ophthalmology;  Laterality: Left;   REMOVAL OF BILATERAL TISSUE EXPANDERS WITH PLACEMENT OF BILATERAL BREAST IMPLANTS Bilateral 08/20/2016   Procedure: REMOVAL OF BILATERAL TISSUE EXPANDERS WITH PLACEMENT OF BILATERAL SILICONE IMPLANTS;  Surgeon: Wallace Going, DO;  Location: [place];  Service: Plastics;  Laterality: Bilateral;   REMOVAL OF BILATERAL TISSUE EXPANDERS WITH PLACEMENT OF BILATERAL BREAST IMPLANTS Bilateral 11/05/2017   Procedure: REMOVAL OF BILATERAL TISSUE EXPANDERS WITH PLACEMENT OF BILATERAL BREAST SILICONE IMPLANTS;  Surgeon: Wallace Going, DO;  Location: [place];  Service: Plastics;   Laterality: Bilateral;   REMOVAL OF TISSUE EXPANDER Bilateral 02/11/2016   Procedure: REMOVAL OF BILATERAL TISSUE EXPANDERS AND FLEX HD REMOVAL;  Surgeon: Wallace Going, DO;  Location: [place];  Service: Plastics;  Laterality: Bilateral;   RETINAL DETACHMENT SURGERY Left 10/01/2020   Pneumatic retinopexy for repair of rheg RD - Dr. Bernarda Caffey   RETINAL DETACHMENT SURGERY Left 10/04/2020   PPV - Dr. Bernarda Caffey   SCLERAL BUCKLE Left 10/04/2020   Procedure: SCLERAL BUCKLE LEFT EYE;  Surgeon: Bernarda Caffey, MD;  Location: [place];  Service: Ophthalmology;  Laterality: Left;   TISSUE EXPANDER PLACEMENT Bilateral 08/12/2017   Procedure: PLACEMENT OF BILATERAL TISSUE EXPANDER;  Surgeon: Wallace Going, DO;  Location: [place];  Service: Plastics;  Laterality: Bilateral;   TUBAL LIGATION Bilateral 1991   VITRECTOMY 25 GAUGE WITH SCLERAL BUCKLE Left 10/04/2020   Procedure: 25 GAUGE PARS PLANA VITRECTOMY LEFT EYE ;  Surgeon: Bernarda Caffey, MD;  Location: [place];  Service: Ophthalmology;  Laterality: Left;   Social History   Socioeconomic History   Marital status: Married    Spouse name: Not on file   Number of children: Not on file   Years of education: Not on file   Highest education level: Not on file  Occupational History   Occupation: Cutter    Employer: [employer]  Tobacco Use   Smoking status: Former  Packs/day: 1.00    Years: 10.00    Total pack years: 10.00    Types: Cigarettes    Quit date: 06/08/2008    Years since quitting: 13.8   Smokeless tobacco: Never  Vaping Use   Vaping Use: Never used  Substance and Sexual Activity   Alcohol use: No   Drug use: Never   Sexual activity: Never    Birth control/protection: Surgical    Comment: hysterectomy  Other Topics Concern   Not on file  Social History Narrative   Not on file   Social Determinants of Health   Financial Resource Strain: Not on file  Food  Insecurity: Not on file  Transportation Needs: Not on file  Physical Activity: Not on file  Stress: Not on file  Social Connections: Not on file   Family History  Problem Relation Age of Onset   Heart failure Father    Prostate cancer Father 33   Retinal detachment Father    Colon polyps Mother        approx 2   Other Mother        hx HPV and hysterectomy due to precancerous cells   Depression Mother    Anxiety disorder Mother    Other Sister 46       hx of hysterectomy for unspecified reason; still has ovaries   Other Sister 57       paternal half-sister hx of hysterectomy for unspecified reason; still has ovaries   Bladder Cancer Maternal Uncle 79       not a smoker   Kidney failure Maternal Grandmother    Congestive Heart Failure Maternal Grandmother    Colon cancer Maternal Grandmother 70   Diabetes Maternal Grandmother    Lung cancer Maternal Grandfather 62       smoker   Breast cancer Paternal Grandmother        dx. early 91s; w/ hx of trauma to breast   Crohn's disease Daughter    Lung cancer Maternal Uncle 37       smoker   No Known Allergies Prior to Admission medications   Medication Sig Start Date End Date Taking? Authorizing Provider  amitriptyline (ELAVIL) 150 MG tablet Take 150 mg by mouth at bedtime. 02/20/22  Yes [provider]  aspirin EC 81 MG tablet Take 1 tablet (81 mg total) by mouth 2 (two) times daily. To be taken after surgery to prevent blood clots.  Swallow whole. 03/24/22 03/24/23  Aundra Dubin, PA-C  buPROPion (WELLBUTRIN XL) 300 MG 24 hr tablet Take 300 mg by mouth every morning. 03/16/22  Yes [provider]  cycloSPORINE (RESTASIS) 0.05 % ophthalmic emulsion Place 1 drop into both eyes 2 (two) times daily.   Yes [provider]  docusate sodium (COLACE) 100 MG capsule Take 1 capsule (100 mg total) by mouth daily as needed. 03/24/22 03/24/23  Aundra Dubin, PA-C  fluconazole (DIFLUCAN) 150 MG tablet Take 150 mg by  mouth See admin instructions. '150mg'$  once a week if needed for yeast infections. 01/23/22  Yes [provider]  metFORMIN (GLUCOPHAGE) 500 MG tablet 1 po with lunch and dinner daily Patient taking differently: Take 500 mg by mouth 2 (two) times daily with breakfast and lunch. 03/18/22  Yes Opalski, Neoma Laming, DO  methocarbamol (ROBAXIN-750) 750 MG tablet Take 1 tablet (750 mg total) by mouth 2 (two) times daily as needed. 03/24/22   Aundra Dubin, PA-C  ondansetron (ZOFRAN) 4 MG tablet Take 1 tablet (  4 mg total) by mouth every 8 (eight) hours as needed for nausea or vomiting. 03/24/22   Aundra Dubin, PA-C  oxyCODONE-acetaminophen (PERCOCET) 5-325 MG tablet Take 1-2 tablets by mouth every 8 (eight) hours as needed. To be taken after surgery 03/24/22   Aundra Dubin, PA-C  REXULTI 2 MG TABS tablet Take 2 mg by mouth at bedtime. 03/09/22  Yes [provider]  tamoxifen (NOLVADEX) 20 MG tablet Take 1 tablet (20 mg total) by mouth daily. 03/17/22  Yes Nicholas Lose, MD  topiramate (TOPAMAX) 50 MG tablet 1 po twice daily Patient taking differently: Take 50 mg by mouth daily. 03/18/22  Yes Opalski, Neoma Laming, DO  traMADol (ULTRAM) 50 MG tablet Take 1 tablet (50 mg total) by mouth every 12 (twelve) hours as needed. Patient taking differently: Take 50 mg by mouth every 12 (twelve) hours as needed for moderate pain. 02/11/22  Yes Aundra Dubin, PA-C  Vitamin D, Ergocalciferol, (DRISDOL) 1.25 MG (50000 UNIT) CAPS capsule 1 po q wed and 1 po q sun Patient taking differently: Take 50,000 Units by mouth See admin instructions. 1 po q wed and 1 po q sun 03/18/22  Yes Opalski, Deborah, DO     Positive ROS: All other systems have been reviewed and were otherwise negative with the exception of those mentioned in the HPI and as above.  Physical Exam: General: Alert, no acute distress Cardiovascular: No pedal edema Respiratory: No cyanosis, no use of accessory musculature GI: abdomen soft Skin: No  lesions in the area of chief complaint Neurologic: Sensation intact distally Psychiatric: Patient is competent for consent with normal mood and affect Lymphatic: no lymphedema  MUSCULOSKELETAL: exam stable  Assessment: right knee osteoarthritis  Plan: Plan for Procedure(s): RIGHT TOTAL KNEE REPLACEMENT  The risks benefits and alternatives were discussed with the patient including but not limited to the risks of nonoperative treatment, versus surgical intervention including infection, bleeding, nerve injury,  blood clots, cardiopulmonary complications, morbidity, mortality, among others, and they were willing to proceed.   Eduard Roux, MD 03/31/2022 9:17 AM

## 2022-03-31 NOTE — Anesthesia Procedure Notes (Signed)
Procedure Name: MAC Date/Time: 03/31/2022 11:43 AM  Performed by: Valda Favia, CRNAPre-anesthesia Checklist: Patient identified, Emergency Drugs available, Suction available, Patient being monitored and Timeout performed Patient Re-evaluated:Patient Re-evaluated prior to induction Oxygen Delivery Method: Simple face mask Placement Confirmation: positive ETCO2 Dental Injury: Teeth and Oropharynx as per pre-operative assessment

## 2022-03-31 NOTE — Discharge Instructions (Signed)

## 2022-04-01 ENCOUNTER — Encounter (HOSPITAL_COMMUNITY): Payer: Self-pay | Admitting: Orthopaedic Surgery

## 2022-04-01 DIAGNOSIS — M1711 Unilateral primary osteoarthritis, right knee: Secondary | ICD-10-CM | POA: Diagnosis not present

## 2022-04-01 LAB — GLUCOSE, CAPILLARY: Glucose-Capillary: 131 mg/dL — ABNORMAL HIGH (ref 70–99)

## 2022-04-01 LAB — CBC
HCT: 38.7 % (ref 36.0–46.0)
Hemoglobin: 12.6 g/dL (ref 12.0–15.0)
MCH: 27.2 pg (ref 26.0–34.0)
MCHC: 32.6 g/dL (ref 30.0–36.0)
MCV: 83.6 fL (ref 80.0–100.0)
Platelets: 232 10*3/uL (ref 150–400)
RBC: 4.63 MIL/uL (ref 3.87–5.11)
RDW: 14.5 % (ref 11.5–15.5)
WBC: 11.3 10*3/uL — ABNORMAL HIGH (ref 4.0–10.5)
nRBC: 0 % (ref 0.0–0.2)

## 2022-04-01 MED ORDER — BUPIVACAINE IN DEXTROSE 0.75-8.25 % IT SOLN
INTRATHECAL | Status: DC | PRN
  Administered 2022-03-31: 1.8 mL via INTRATHECAL

## 2022-04-01 NOTE — Progress Notes (Signed)
Subjective: 1 Day Post-Op Procedure(s) (LRB): RIGHT TOTAL KNEE REPLACEMENT (Right) Patient reports pain as mild.    Objective: Vital signs in last 24 hours: Temp:  [97.8 F (36.6 C)-98.2 F (36.8 C)] 98.2 F (36.8 C) (07/25 0756) Pulse Rate:  [60-79] 76 (07/25 0756) Resp:  [12-28] 18 (07/25 0756) BP: (95-148)/(53-85) 118/69 (07/25 0756) SpO2:  [91 %-100 %] 93 % (07/25 0756) Weight:  [90.7 kg] 90.7 kg (07/24 0817)  Intake/Output from previous day: 07/24 0701 - 07/25 0700 In: 1500 [I.V.:1300; IV Piggyback:200] Out: 675 [Urine:575; Blood:100] Intake/Output this shift: No intake/output data recorded.  Recent Labs    04/01/22 0457  HGB 12.6   Recent Labs    04/01/22 0457  WBC 11.3*  RBC 4.63  HCT 38.7  PLT 232   No results for input(s): "NA", "K", "CL", "CO2", "BUN", "CREATININE", "GLUCOSE", "CALCIUM" in the last 72 hours. No results for input(s): "LABPT", "INR" in the last 72 hours.  Neurologically intact Neurovascular intact Sensation intact distally Intact pulses distally Dorsiflexion/Plantar flexion intact Incision: scant drainage No cellulitis present Compartment soft   Assessment/Plan: 1 Day Post-Op Procedure(s) (LRB): RIGHT TOTAL KNEE REPLACEMENT (Right) Advance diet Up with therapy D/C IV fluids Discharge home with home health after second PT session as long as she clears PT WBAT RLE    Anticipated LOS equal to or greater than 2 midnights due to - Age 13 and older with one or more of the following:  - Obesity  - Expected need for hospital services (PT, OT, Nursing) required for safe  discharge  - Anticipated need for postoperative skilled nursing care or inpatient rehab  - Active co-morbidities: None OR   - Unanticipated findings during/Post Surgery: None  - Patient is a high risk of re-admission due to: None   Aundra Dubin 04/01/2022, 8:13 AM

## 2022-04-01 NOTE — Anesthesia Procedure Notes (Signed)
Spinal  Patient location during procedure: OR Start time: 03/31/2022 11:43 AM End time: 03/31/2022 11:46 AM Reason for block: surgical anesthesia Staffing Performed: anesthesiologist  Anesthesiologist: Albertha Ghee, MD Performed by: Albertha Ghee, MD Authorized by: Albertha Ghee, MD   Preanesthetic Checklist Completed: patient identified, IV checked, risks and benefits discussed, surgical consent, monitors and equipment checked, pre-op evaluation and timeout performed Spinal Block Patient position: sitting Prep: DuraPrep Patient monitoring: cardiac monitor, continuous pulse ox and blood pressure Approach: midline Location: L3-4 Injection technique: single-shot Needle Needle type: Pencan  Needle gauge: 24 G Needle length: 9 cm Assessment Sensory level: T10 Events: CSF return Additional Notes Functioning IV was confirmed and monitors were applied. Sterile prep and drape, including hand hygiene and sterile gloves were used. The patient was positioned and the spine was prepped. The skin was anesthetized with lidocaine.  Free flow of clear CSF was obtained prior to injecting local anesthetic into the CSF.  The spinal needle aspirated freely following injection.  The needle was carefully withdrawn.  The patient tolerated the procedure well.

## 2022-04-01 NOTE — Progress Notes (Signed)
Physical Therapy Treatment Patient Details Name: Christina [name] MRN: [id number] DOB: [birth year] Today's Date: 04/01/2022   History of Present Illness Pt is a 56 y.o. F who presents s/p R TKA 03/31/2022. Significant PMH: DM2, depression.    PT Comments    Pt progressing well with post-op mobility. Pt was able to progress ambulation distance and overall gait pattern, demonstrating a good heel strike and swing through by end of gait training. Pt anticipates d/c today. Pt was educated on car transfer, HEP, and stair negotiation. Will continue to follow until d/c.     Recommendations for follow up therapy are one component of a multi-disciplinary discharge planning process, led by the attending physician.  Recommendations may be updated based on patient status, additional functional criteria and insurance authorization.  Follow Up Recommendations  Follow physician's recommendations for discharge plan and follow up therapies     Assistance Recommended at Discharge PRN  Patient can return home with the following Assistance with cooking/housework;Assist for transportation;Help with stairs or ramp for entrance   Equipment Recommendations  None recommended by PT (DME delivered to house prior to surgery)    Recommendations for Other Services       Precautions / Restrictions Precautions Precautions: Fall Precaution Comments: Reviewed NO pillow/roll/ice pack under the knee - only under the heel. Restrictions Weight Bearing Restrictions: No     Mobility  Bed Mobility               General bed mobility comments: Pt was received sitting up in the recliner.    Transfers Overall transfer level: Needs assistance Equipment used: Rolling walker (2 wheels) Transfers: Sit to/from Stand Sit to Stand: Supervision           General transfer comment: Pt demonstrated proper hand placement on seated surface for safety. No assist required.    Ambulation/Gait Ambulation/Gait assistance:  Min guard, Supervision Gait Distance (Feet): 200 Feet Assistive device: Rolling walker (2 wheels) Gait Pattern/deviations: Decreased stance time - right, Decreased weight shift to right, Step-through pattern, Wide base of support Gait velocity: Decreased Gait velocity interpretation: 1.31 - 2.62 ft/sec, indicative of limited community ambulator   General Gait Details: VC's for improved posture, closer walker proximity, and forward gaze. Pt progressed to step-through pattern with good heel strike.   Stairs Stairs: Yes Stairs assistance: Min guard Stair Management: One rail Left, Step to pattern, Forwards Number of Stairs: 6 General stair comments: VC's for sequencing and general safety. No assist required but hands on guarding for safety.   Wheelchair Mobility    Modified Rankin (Stroke Patients Only)       Balance Overall balance assessment: Needs assistance Sitting-balance support: Feet supported Sitting balance-Leahy Scale: Normal     Standing balance support: Bilateral upper extremity supported, During functional activity Standing balance-Leahy Scale: Fair Standing balance comment: statically; requires UE support for dynamic standing activity.                            Cognition Arousal/Alertness: Awake/alert Behavior During Therapy: WFL for tasks assessed/performed Overall Cognitive Status: Within Functional Limits for tasks assessed                                          Exercises Total Joint Exercises Quad Sets: 10 reps Short Arc Quad: 10 reps Heel Slides: 10 reps Hip ABduction/ADduction: 10  reps Straight Leg Raises: 10 reps Long Arc Quad: 10 reps Knee Flexion: 10 reps    General Comments General comments (skin integrity, edema, etc.): dressing dry with x3 dark blood areas noted. Circle applied to areas to help define borders      Pertinent Vitals/Pain Pain Assessment Pain Assessment: Faces Faces Pain Scale: Hurts little  more Pain Location: R knee Pain Descriptors / Indicators: Discomfort, Grimacing, Operative site guarding Pain Intervention(s): Limited activity within patient's tolerance, Monitored during session, Repositioned    Home Living Family/patient expects to be discharged to:: Private residence Living Arrangements: Spouse/significant other;Children Available Help at Discharge: Family;Available 24 hours/day Type of Home: House Home Access: Level entry       Home Layout: One level Home Equipment: Conservation officer, nature (2 wheels);BSC/3in1 Additional Comments: cat/ 6yo and 53 yo grandchildren , daughter to (A)    Prior Function            PT Goals (current goals can now be found in the care plan section) Acute Rehab PT Goals Patient Stated Goal: did not state PT Goal Formulation: With patient Time For Goal Achievement: 04/14/22 Potential to Achieve Goals: Good Progress towards PT goals: Progressing toward goals    Frequency    7X/week      PT Plan Current plan remains appropriate    Co-evaluation              AM-PAC PT "6 Clicks" Mobility   Outcome Measure  Help needed turning from your back to your side while in a flat bed without using bedrails?: None Help needed moving from lying on your back to sitting on the side of a flat bed without using bedrails?: None Help needed moving to and from a bed to a chair (including a wheelchair)?: A Little Help needed standing up from a chair using your arms (e.g., wheelchair or bedside chair)?: A Little Help needed to walk in hospital room?: A Little Help needed climbing 3-5 steps with a railing? : A Little 6 Click Score: 20    End of Session Equipment Utilized During Treatment: Gait belt Activity Tolerance: Patient tolerated treatment well Patient left: in chair;with call bell/phone within reach Nurse Communication: Mobility status PT Visit Diagnosis: Other abnormalities of gait and mobility (R26.89);Pain Pain - Right/Left:  Right Pain - part of body: Knee     Time: 8938-1017 PT Time Calculation (min) (ACUTE ONLY): 28 min  Charges:  $Gait Training: 8-22 mins $Therapeutic Exercise: 8-22 mins                     Rolinda Roan, PT, DPT Acute Rehabilitation Services Secure Chat Preferred Office: [phone]    [name] 04/01/2022, 10:01 AM

## 2022-04-01 NOTE — Addendum Note (Signed)
Addendum  created 04/01/22 1515 by Albertha Ghee, MD   Child order released for a procedure order, Clinical Note Signed, Intraprocedure Blocks edited, Intraprocedure Meds edited, SmartForm saved

## 2022-04-01 NOTE — Anesthesia Postprocedure Evaluation (Signed)
Anesthesia Post Note  Patient: Adamae Ricklefs Fenster  Procedure(s) Performed: RIGHT TOTAL KNEE REPLACEMENT (Right: Knee)     Patient location during evaluation: PACU Anesthesia Type: MAC and Spinal Level of consciousness: oriented and awake and alert Pain management: pain level controlled Vital Signs Assessment: post-procedure vital signs reviewed and stable Respiratory status: spontaneous breathing, respiratory function stable and patient connected to nasal cannula oxygen Cardiovascular status: blood pressure returned to baseline and stable Postop Assessment: no headache, no backache and no apparent nausea or vomiting Anesthetic complications: no   No notable events documented.  Last Vitals:  Vitals:   04/01/22 0323 04/01/22 0756  BP: (!) 116/55 118/69  Pulse: 68 76  Resp: 18 18  Temp: 36.8 C 36.8 C  SpO2: 96% 93%    Last Pain:  Vitals:   04/01/22 0756  TempSrc: Oral  PainSc:                  Isabela S

## 2022-04-01 NOTE — Evaluation (Signed)
Occupational Therapy Evaluation Patient Details Name: Christina [name] MRN: [id number] DOB: 23-Dec-1965 Today's Date: 04/01/2022   History of Present Illness Pt is a 56 y.o. F who presents s/p R TKA 03/31/2022. Significant PMH: DM2, depression.   Clinical Impression   Patient evaluated by Occupational Therapy with no further acute OT needs identified. All education has been completed and the patient has no further questions. See below for any follow-up Occupational Therapy or equipment needs. OT to sign off. Thank you for referral.        Recommendations for follow up therapy are one component of a multi-disciplinary discharge planning process, led by the attending physician.  Recommendations may be updated based on patient status, additional functional criteria and insurance authorization.   Follow Up Recommendations  No OT follow up    Assistance Recommended at Discharge PRN  Patient can return home with the following Assist for transportation    Functional Status Assessment  Patient has had a recent decline in their functional status and demonstrates the ability to make significant improvements in function in a reasonable and predictable amount of time.  Equipment Recommendations  None recommended by OT    Recommendations for Other Services       Precautions / Restrictions Precautions Precautions: Fall      Mobility Bed Mobility Overal bed mobility: Modified Independent             General bed mobility comments: bed flat no rails    Transfers Overall transfer level: Needs assistance Equipment used: Rolling walker (2 wheels) Transfers: Sit to/from Stand Sit to Stand: Supervision           General transfer comment: educated on pushing with bil UE from surface. discussed R LE extension for sit<>stand      Balance Overall balance assessment: Needs assistance Sitting-balance support: Feet supported Sitting balance-Leahy Scale: Normal     Standing balance  support: Bilateral upper extremity supported, During functional activity Standing balance-Leahy Scale: Fair                             ADL either performed or assessed with clinical judgement   ADL Overall ADL's : Modified independent                                       General ADL Comments: able to dress LB with dressing R LE first. pt educated on tub transfer and use of BSC. pt provided handout out. pt will have daughter (A) to transfer tub. pt plans to sleep on the couch. advised to keep the R LE against the back of the couch for safety     Vision Baseline Vision/History: 1 Wears glasses       Perception     Praxis      Pertinent Vitals/Pain Pain Assessment Pain Assessment: No/denies pain     Hand Dominance Right   Extremity/Trunk Assessment Upper Extremity Assessment Upper Extremity Assessment: Overall WFL for tasks assessed   Lower Extremity Assessment Lower Extremity Assessment: Defer to PT evaluation   Cervical / Trunk Assessment Cervical / Trunk Assessment: Normal   Communication Communication Communication: No difficulties   Cognition Arousal/Alertness: Awake/alert Behavior During Therapy: WFL for tasks assessed/performed Overall Cognitive Status: Within Functional Limits for tasks assessed  General Comments  dressing dry with x3 dark blood areas noted. Circle applied to areas to help define borders    Exercises Exercises: Other exercises Other Exercises Other Exercises: discussed full extension of the knee at all time. discussed ice and ice machine. Using clean wash cloth each and every session   Shoulder Instructions      Home Living Family/patient expects to be discharged to:: Private residence Living Arrangements: Spouse/significant other;Children Available Help at Discharge: Family;Available 24 hours/day Type of Home: House Home Access: Level entry     Home  Layout: One level     Bathroom Shower/Tub: Teacher, early years/pre: Standard     Home Equipment: Conservation officer, nature (2 wheels);BSC/3in1   Additional Comments: cat/ 6yo and 7 yo grandchildren , daughter to (A)      Prior Functioning/Environment Prior Level of Function : Independent/Modified Independent             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)          OT Problem List:        OT Treatment/Interventions:      OT Goals(Current goals can be found in the care plan section) Acute Rehab OT Goals Patient Stated Goal: to return home  OT Frequency:      Co-evaluation              AM-PAC OT "6 Clicks" Daily Activity     Outcome Measure Help from another person eating meals?: None Help from another person taking care of personal grooming?: None Help from another person toileting, which includes using toliet, bedpan, or urinal?: None Help from another person bathing (including washing, rinsing, drying)?: None Help from another person to put on and taking off regular upper body clothing?: None Help from another person to put on and taking off regular lower body clothing?: None 6 Click Score: 24   End of Session Equipment Utilized During Treatment: Rolling walker (2 wheels) Nurse Communication: Mobility status;Precautions  Activity Tolerance: Patient tolerated treatment well Patient left: in chair;with call bell/phone within reach  OT Visit Diagnosis: Unsteadiness on feet (R26.81)                Time: 1610-9604 OT Time Calculation (min): 24 min Charges:  OT General Charges $OT Visit: 1 Visit OT Evaluation $OT Eval Moderate Complexity: 1 Mod   Brynn, OTR/L  Acute Rehabilitation Services Office: [phone] .   Jeri Modena 04/01/2022, 9:12 AM

## 2022-04-01 NOTE — Plan of Care (Signed)
Pt doing well. Pt given D/C instructions with verbal understanding. Rx's were sent to the pharmacy by MD. Pt's incision is clean and dry with no sign of infection. Pt's IV was removed prior to D/C. Pt D/C'd home via wheelchair per MD order. Pt is stable @ D/C and has no other needs at this time. Nell Schrack, RN  

## 2022-04-01 NOTE — Discharge Summary (Signed)
Patient ID: Christina [name] MRN: [id number] DOB/AGE: 01-14-1966 56 y.o.  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,  multifocal IDC, DCIS, ER/PR+,  01-09-2016 s/p bilteral mastectomies w/ left sln bx;  no chemoradiation   Cataracts, bilateral    Depression    Diabetes mellitus without complication ([place])    type 2   GAD (generalized anxiety disorder)    Gallbladder problem    History of ovarian cyst    IBS (irritable bowel syndrome)    Joint pain    PONV (postoperative nausea and vomiting)    does well with scop patch   Retinal detachment    Rheg OS   Right knee meniscal tear    Urgency of urination     Surgeries: Procedure(s): RIGHT TOTAL KNEE REPLACEMENT on 03/31/2022   Consultants:   Discharged Condition: Improved  Hospital Course: Christina [name] is an 56 y.o. female who was admitted 03/31/2022 for operative treatment ofPrimary osteoarthritis of right knee. Patient has severe unremitting pain that affects sleep, daily activities, and work/hobbies. After pre-op clearance the patient was taken to the operating room on 03/31/2022 and underwent  Procedure(s): RIGHT TOTAL KNEE REPLACEMENT.    Patient was given perioperative antibiotics:  Anti-infectives (From admission, onward)    Start     Dose/Rate Route Frequency Ordered Stop   03/31/22 1800  ceFAZolin (ANCEF) IVPB 2g/100 mL premix        2 g 200 mL/hr over 30 Minutes Intravenous Every 6 hours 03/31/22 1542 03/31/22 2358   03/31/22 1317  vancomycin (VANCOCIN) powder  Status:  Discontinued          As needed 03/31/22 1317 03/31/22 1401   03/31/22 0900  ceFAZolin (ANCEF)  2-4 GM/100ML-% IVPB       Note to Pharmacy: Alba Cory B: cabinet override      03/31/22 0900 03/31/22 1158   03/31/22 0845  ceFAZolin (ANCEF) IVPB 2g/100 mL premix        2 g 200 mL/hr over 30 Minutes Intravenous On call to O.R. 03/31/22 0839 03/31/22 1149        Patient was given sequential compression devices, early ambulation, and chemoprophylaxis to prevent DVT.  Patient benefited maximally from hospital stay and there were no complications.    Recent vital signs: Patient Vitals for the past 24 hrs:  BP Temp Temp src Pulse Resp SpO2 Height Weight  04/01/22 0756 118/69 98.2 F (36.8 C) Oral 76 18 93 % -- --  04/01/22 0323 (!) 116/55 98.2 F (36.8 C) Oral 68 18 96 % -- --  03/31/22 2302 108/68 98.2 F (36.8 C) Oral 76 18 93 % -- --  03/31/22 1954 128/84 98.1 F (36.7 C) Oral 79 18 94 % -- --  03/31/22 1539 136/77 98 F (36.7 C) -- 64 18 98 % -- --  03/31/22 1515 120/82 97.8 F (36.6 C) -- 71 (!) 21 93 % -- --  03/31/22 1500 121/78 -- -- 64 12 94 % -- --  03/31/22 1445 (!) 95/53 -- -- 61 (!) 23 91 % -- --  03/31/22 1430 -- -- -- --  18 -- -- --  03/31/22 1404 102/64 98.1 F (36.7 C) -- 60 (!) 24 98 % -- --  03/31/22 1020 130/76 -- -- 69 (!) 21 96 % -- --  03/31/22 1015 128/74 -- -- 70 20 96 % -- --  03/31/22 1010 120/81 -- -- 70 (!) 23 95 % -- --  03/31/22 1005 111/71 -- -- 70 (!) 28 99 % -- --  03/31/22 1000 133/83 -- -- 67 (!) 24 99 % -- --  03/31/22 0941 137/84 -- -- 64 19 100 % -- --  03/31/22 0817 (!) 148/85 98.2 F (36.8 C) Oral 70 18 99 % '5\' 7"'$  (1.702 m) 90.7 kg     Recent laboratory studies:  Recent Labs    04/01/22 0457  WBC 11.3*  HGB 12.6  HCT 38.7  PLT 232     Discharge Medications:   Allergies as of 04/01/2022   No Known Allergies      Medication List     STOP taking these medications    traMADol 50 MG tablet Commonly known as: ULTRAM       TAKE these medications    amitriptyline 150 MG tablet Commonly known as: ELAVIL Take 150  mg by mouth at bedtime.   aspirin EC 81 MG tablet Take 1 tablet (81 mg total) by mouth 2 (two) times daily. To be taken after surgery to prevent blood clots.  Swallow whole.   buPROPion 300 MG 24 hr tablet Commonly known as: WELLBUTRIN XL Take 300 mg by mouth every morning.   cycloSPORINE 0.05 % ophthalmic emulsion Commonly known as: RESTASIS Place 1 drop into both eyes 2 (two) times daily.   docusate sodium 100 MG capsule Commonly known as: Colace Take 1 capsule (100 mg total) by mouth daily as needed.   fluconazole 150 MG tablet Commonly known as: DIFLUCAN Take 150 mg by mouth See admin instructions. '150mg'$  once a week if needed for yeast infections.   metFORMIN 500 MG tablet Commonly known as: GLUCOPHAGE 1 po with lunch and dinner daily What changed:  how much to take how to take this when to take this additional instructions   methocarbamol 750 MG tablet Commonly known as: Robaxin-750 Take 1 tablet (750 mg total) by mouth 2 (two) times daily as needed.   ondansetron 4 MG tablet Commonly known as: Zofran Take 1 tablet (4 mg total) by mouth every 8 (eight) hours as needed for nausea or vomiting.   oxyCODONE-acetaminophen 5-325 MG tablet Commonly known as: Percocet Take 1-2 tablets by mouth every 8 (eight) hours as needed. To be taken after surgery   Rexulti 2 MG Tabs tablet Generic drug: brexpiprazole Take 2 mg by mouth at bedtime.   tamoxifen 20 MG tablet Commonly known as: NOLVADEX Take 1 tablet (20 mg total) by mouth daily.   topiramate 50 MG tablet Commonly known as: Topamax 1 po twice daily What changed:  how much to take how to take this when to take this additional instructions   Vitamin D (Ergocalciferol) 1.25 MG (50000 UNIT) Caps capsule Commonly known as: DRISDOL 1 po q wed and 1 po q sun What changed:  how much to take how to take this when to take this               Durable Medical Equipment  (From admission, onward)            Start     Ordered   03/31/22 1543  DME Walker rolling  Once       Question Answer Comment  Walker: With [place]   Patient needs a walker to treat with the following condition Status post left partial knee replacement      03/31/22 1542   03/31/22 1543  DME 3 n 1  Once        03/31/22 1542   03/31/22 1543  DME Bedside commode  Once       Question:  Patient needs a bedside commode to treat with the following condition  Answer:  Status post left partial knee replacement   03/31/22 1542            Diagnostic Studies: DG Knee Right Port  Result Date: 03/31/2022 CLINICAL DATA:  Postop knee replacement EXAM: PORTABLE RIGHT KNEE - 1-2 VIEW COMPARISON:  Knee radiographs 02/12/2019 FINDINGS: The patient is status post right knee arthroplasty. Hardware alignment is within expected limits, without evidence of complication. There is expected postoperative soft tissue gas and swelling. IMPRESSION: Status post right knee arthroplasty without evidence of complication. Electronically Signed   By: Valetta Mole M.D.   On: 03/31/2022 15:02   ECHOCARDIOGRAM COMPLETE  Result Date: 03/31/2022    ECHOCARDIOGRAM REPORT   Patient Name:   ABELINA KETRON Date of Exam: 03/31/2022 Medical Rec #:  [id number]       Height:       67.0 in Accession #:    [id number]      Weight:       200.0 lb Date of Birth:  [birth year]       BSA:          2.022 m Patient Age:    40 years        BP:           148/85 mmHg Patient Gender: F               HR:           68 bpm. Exam Location:  Inpatient Procedure: 2D Echo, Color Doppler, Cardiac Doppler and Intracardiac            Opacification Agent Indications:    R01.1 Murmur  History:        Patient has no prior history of Echocardiogram examinations.                 Risk Factors:Diabetes.  Sonographer:    Raquel Sarna Senior RDCS Referring Phys: Jeneen Rinks D BURNS  Sonographer Comments: Technically difficult study, poor echo windows due to mastectomy and reconstruction IMPRESSIONS  1. Left  ventricular ejection fraction, by estimation, is 60 to 65%. The left ventricle has normal function. The left ventricle has no regional wall motion abnormalities. Left ventricular diastolic parameters were normal.  2. Right ventricular systolic function is normal. The right ventricular size is normal.  3. The mitral valve is normal in structure. No evidence of mitral valve regurgitation. No evidence of mitral stenosis.  4. The aortic valve is tricuspid. Aortic valve regurgitation is not visualized. No aortic stenosis is present.  5. The inferior vena cava is normal in size with greater than 50% respiratory variability, suggesting right atrial pressure of 3 mmHg. FINDINGS  Left Ventricle: Left ventricular ejection fraction, by estimation, is 60 to 65%. The left ventricle has normal function. The left ventricle has no regional wall motion abnormalities. Definity contrast agent was given IV to delineate the left ventricular  endocardial borders. The left ventricular internal cavity size was normal in size. There is no  left ventricular hypertrophy. Left ventricular diastolic parameters were normal. Right Ventricle: The right ventricular size is normal. No increase in right ventricular wall thickness. Right ventricular systolic function is normal. Left Atrium: Left atrial size was normal in size. Right Atrium: Right atrial size was normal in size. Pericardium: There is no evidence of pericardial effusion. Mitral Valve: The mitral valve is normal in structure. No evidence of mitral valve regurgitation. No evidence of mitral valve stenosis. Tricuspid Valve: The tricuspid valve is normal in structure. Tricuspid valve regurgitation is not demonstrated. No evidence of tricuspid stenosis. Aortic Valve: The aortic valve is tricuspid. Aortic valve regurgitation is not visualized. No aortic stenosis is present. Pulmonic Valve: The pulmonic valve was normal in structure. Pulmonic valve regurgitation is trivial. No evidence of  pulmonic stenosis. Aorta: The aortic root is normal in size and structure. Venous: The inferior vena cava is normal in size with greater than 50% respiratory variability, suggesting right atrial pressure of 3 mmHg. IAS/Shunts: No atrial level shunt detected by color flow Doppler.  LEFT VENTRICLE PLAX 2D LVIDd:         3.40 cm   Diastology LVIDs:         1.90 cm   LV e' medial:    7.18 cm/s LV PW:         1.10 cm   LV E/e' medial:  10.0 LV IVS:        1.00 cm   LV e' lateral:   8.16 cm/s LVOT diam:     2.30 cm   LV E/e' lateral: 8.8 LV SV:         88 LV SV Index:   44 LVOT Area:     4.15 cm  LEFT ATRIUM             Index LA diam:        2.80 cm 1.38 cm/m LA Vol (A2C):   59.0 ml 29.18 ml/m LA Vol (A4C):   41.6 ml 20.57 ml/m LA Biplane Vol: 50.2 ml 24.82 ml/m  AORTIC VALVE LVOT Vmax:   95.90 cm/s LVOT Vmean:  73.100 cm/s LVOT VTI:    0.213 m  AORTA Ao Root diam: 3.50 cm Ao Asc diam:  3.30 cm MITRAL VALVE MV Area (PHT): 3.48 cm    SHUNTS MV Decel Time: 218 msec    Systemic VTI:  0.21 m MV E velocity: 72.10 cm/s  Systemic Diam: 2.30 cm MV A velocity: 58.10 cm/s MV E/A ratio:  1.24 Jenkins Rouge MD Electronically signed by Jenkins Rouge MD Signature Date/Time: 03/31/2022/10:44:33 AM    Final     Disposition: Discharge disposition: 01-Home or Self Care          Follow-up Information     Leandrew Koyanagi, MD. Schedule an appointment as soon as possible for a visit in 2 week(s).   Specialty: Orthopedic Surgery Contact information: [place] [address] [phone]         [facility], [facility] Follow up.   Specialty: Home Health Services Why: The home health agency will contact you for the first home visit. Contact information: [address] [place] [address] [phone]                  Signed: Aundra Dubin 04/01/2022, 8:14 AM

## 2022-04-04 ENCOUNTER — Telehealth: Payer: Self-pay | Admitting: Orthopaedic Surgery

## 2022-04-04 NOTE — Telephone Encounter (Signed)
Christina [name] from [facility] called in stating patient has a high blood pressure reading of 150/96 and is Asymptomatic her callback number is [phone]

## 2022-04-06 ENCOUNTER — Encounter (HOSPITAL_COMMUNITY): Payer: Self-pay | Admitting: Orthopaedic Surgery

## 2022-04-07 ENCOUNTER — Telehealth: Payer: Self-pay | Admitting: Orthopaedic Surgery

## 2022-04-07 ENCOUNTER — Other Ambulatory Visit: Payer: Self-pay | Admitting: Physician Assistant

## 2022-04-07 MED ORDER — OXYCODONE-ACETAMINOPHEN 5-325 MG PO TABS: 1.0000 | ORAL_TABLET | Freq: Three times a day (TID) | ORAL | 0 refills | Status: DC | PRN

## 2022-04-07 NOTE — Telephone Encounter (Signed)
Patient called. She would like a refill on oxycodone. Her call back number is [phone]

## 2022-04-07 NOTE — Telephone Encounter (Signed)
Sent in

## 2022-04-14 ENCOUNTER — Inpatient Hospital Stay: Payer: 59 | Attending: Hematology and Oncology | Admitting: Hematology and Oncology

## 2022-04-14 NOTE — Assessment & Plan Note (Deleted)
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 Tamoxifen toxicities:Hot flashes have gone away. Denies any myalgias(patient had a prior hysterectomy) She has arthritis in her right hipimproved with cortisone injection.  Surveillance: No role of mammograms since she had bil mastectomies (bilateral reconstructed breasts without any palpable lumps or nodules) Chest exam:  04/14/2022: No palpable lumps in chest wall or axilla.  Return to clinic in1 yearfor follow-up andbreast exams and surveillance 

## 2022-04-15 ENCOUNTER — Ambulatory Visit (INDEPENDENT_AMBULATORY_CARE_PROVIDER_SITE_OTHER): Payer: 59 | Admitting: Physician Assistant

## 2022-04-15 ENCOUNTER — Ambulatory Visit (INDEPENDENT_AMBULATORY_CARE_PROVIDER_SITE_OTHER): Payer: 59 | Admitting: Family Medicine

## 2022-04-15 DIAGNOSIS — Z96651 Presence of right artificial knee joint: Secondary | ICD-10-CM

## 2022-04-15 NOTE — Progress Notes (Signed)
Post-Op Visit Note   Patient: Christina [name]           Date of Birth: [birth year]           MRN: [id number] Visit Date: 04/15/2022 PCP: Maurice Small, MD   Assessment & Plan:  Chief Complaint:  Chief Complaint  Patient presents with   Right Knee - Routine Post Op   Visit Diagnoses:  1. Hx of total knee replacement, right     Plan: Patient is a pleasant 56 year old female who comes in today 2 weeks status post right total knee replacement 03/31/2022.She has been doing well.  She is getting physical therapy and is ambulating unassisted.  She is taking Tylenol for pain.  She has been taking the baby aspirin but only once daily.  Examination of the right knee reveals a well healing surgical incision with nylon sutures in place.  No evidence of infection or cellulitis.  Calves are soft and nontender.  She is neurovascularly intact distally.  Today, sutures removed and Steri-Strips applied.  I will send in a referral for outpatient physical therapy.  She will follow-up in 4 weeks for recheck.  In the meantime, instructed her to take a baby aspirin twice daily for another 4 weeks for DVT prophylaxis.  Dental prophylaxis reinforced.  Call with concerns or questions.  Follow-Up Instructions: Return in about 4 weeks (around 05/13/2022).   Orders:  Orders Placed This Encounter  Procedures   Ambulatory referral to Physical Therapy   No orders of the defined types were placed in this encounter.   Imaging: No new imaging  PMFS History: Patient Active Problem List   Diagnosis Date Noted   Status post total right knee replacement 03/31/2022   Primary osteoarthritis of right knee 03/20/2022   Eating disorder 03/20/2022   Acute medial meniscus tear, right, initial encounter 10/01/2021   At risk for dehydration 01/28/2021   At risk for malnutrition 01/01/2021   Obesity, Class I, BMI 30-34.9 12/31/2020   Vitamin B12 deficiency 10/08/2020   At risk for depression 10/08/2020   Mood disorder  ([place]), with emotional eating 09/24/2020   At risk for impaired metabolic function 84/13/2440   At risk for diabetes mellitus 07/30/2020   Vitamin D deficiency 07/02/2020   Depression 07/02/2020   At risk for side effect of medication 07/02/2020   Stress due to illness of family member-  daughter with drug addiction 05/15/2020   Depression, recurrent ([place]) 05/15/2020   Anemia 05/15/2020   Constipation 05/15/2020   Prediabetes 05/15/2020   Breast asymmetry following reconstructive surgery 04/24/2020   Acquired absence of breast 04/24/2020   S/P breast reconstruction, bilateral 04/24/2020   Acute medial meniscus tear of left knee 12/20/2019   Breast cancer, left ([place]) 01/09/2016   Genetic testing 07/05/2535   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   Family history of breast cancer in female 11/08/2015   Family history of colon cancer 11/08/2015   Malignant neoplasm of central portion of left breast in female, estrogen receptor positive ([place]) 10/31/2015   Endometriosis 03/11/2011   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,  multifocal IDC, DCIS, ER/PR+,  01-09-2016 s/p bilteral mastectomies w/ left sln bx;  no chemoradiation   Cataracts, bilateral    Depression    Diabetes mellitus without complication ([place])    type  2   GAD (generalized anxiety disorder)    Gallbladder problem    History of ovarian cyst    IBS (irritable bowel syndrome)    Joint pain    PONV (postoperative nausea and vomiting)    does well with scop patch   Retinal detachment    Rheg OS   Right knee meniscal tear    Urgency of urination     Family History  Problem Relation Age of Onset   Heart failure Father    Prostate cancer Father 51   Retinal detachment Father    Colon polyps Mother        approx 2   Other Mother        hx HPV and hysterectomy due to precancerous  cells   Depression Mother    Anxiety disorder Mother    Other Sister 59       hx of hysterectomy for unspecified reason; still has ovaries   Other Sister 24       paternal half-sister hx of hysterectomy for unspecified reason; still has ovaries   Bladder Cancer Maternal Uncle 79       not a smoker   Kidney failure Maternal Grandmother    Congestive Heart Failure Maternal Grandmother    Colon cancer Maternal Grandmother 70   Diabetes Maternal Grandmother    Lung cancer Maternal Grandfather 97       smoker   Breast cancer Paternal Grandmother        dx. early 75s; w/ hx of trauma to breast   Crohn's disease Daughter    Lung cancer Maternal Uncle 37       smoker    Past Surgical History:  Procedure Laterality Date   ABDOMINAL HYSTERECTOMY  05/1996   endometriosis   ACHILLES TENDON REPAIR Right 2010;  revision 2011   BLADDER SUSPENSION  2000   BREAST BIOPSY Left 10/2015   BREAST IMPLANT REMOVAL Bilateral 08/12/2017   Procedure: REMOVAL BILATERAL BREAST IMPLANTS;  Surgeon: Wallace Going, DO;  Location: [facility];  Service: Plastics;  Laterality: Bilateral;   BREAST RECONSTRUCTION WITH PLACEMENT OF TISSUE EXPANDER AND FLEX HD (ACELLULAR HYDRATED DERMIS) Bilateral 01/09/2016   BREAST RECONSTRUCTION WITH PLACEMENT OF TISSUE EXPANDER AND FLEX HD (ACELLULAR HYDRATED DERMIS) Bilateral 01/09/2016   Procedure: BREAST RECONSTRUCTION WITH PLACEMENT OF TISSUE EXPANDER AND FLEX HD (ACELLULAR HYDRATED DERMIS);  Surgeon: Loel Lofty Dillingham, DO;  Location: [facility];  Service: Plastics;  Laterality: Bilateral;   BREAST RECONSTRUCTION WITH PLACEMENT OF TISSUE EXPANDER AND FLEX HD (ACELLULAR HYDRATED DERMIS) Bilateral 05/29/2016   Procedure: PLACEMENT OF BILATERAL TISSUE EXPANDER AND FLEX HD (ACELLULAR HYDRATED DERMIS);  Surgeon: Wallace Going, DO;  Location: [facility];  Service: Plastics;  Laterality: Bilateral;   BREAST REDUCTION SURGERY Bilateral 11/26/2016    Procedure: BILATERAL BREAST CAPSULE CONTRACTURE RELASE;  Surgeon: Wallace Going, DO;  Location: [facility];  Service: Plastics;  Laterality: Bilateral;   [place]; 1989; [place] Right x3   last one 02-01-2019 @ [facility]   EYE SURGERY Left 10/01/2020   Pneumatic retinopexy for repair of rheg RD - Dr. Bernarda Caffey   EYE SURGERY Left 10/04/2020   PPV - Dr. Bernarda Caffey   FAT GRAFTING BILATERAL BREAST  08-09-2018  '@[facility]'    GAS INSERTION Left 10/04/2020   Procedure: INSERTION OF GAS;  Surgeon: Bernarda Caffey, MD;  Location: [facility];  Service: Ophthalmology;  Laterality: Left;   GAS/FLUID  EXCHANGE Left 10/04/2020   Procedure: GAS/FLUID EXCHANGE;  Surgeon: Bernarda Caffey, MD;  Location: [facility];  Service: Ophthalmology;  Laterality: Left;   INCISION AND DRAINAGE OF WOUND Bilateral 02/11/2016   Procedure: IRRIGATION AND DEBRIDEMENT OF BILATERAL BREAST POCKET;  Surgeon: Wallace Going, DO;  Location: [facility];  Service: Plastics;  Laterality: Bilateral;   KNEE ARTHROSCOPY WITH MEDIAL MENISECTOMY Right 12/20/2019   Procedure: KNEE ARTHROSCOPY WITH MEDIAL MENISECTOMY;  Surgeon: Marchia Bond, MD;  Location: [facility];  Service: Orthopedics;  Laterality: Right;   KNEE ARTHROSCOPY WITH MEDIAL MENISECTOMY Right 10/30/2021   Procedure: RIGHT KNEE ARTHROSCOPY WITH PARTIAL MEDIAL MENISECTOMY SYNOVECTOMY;  Surgeon: Leandrew Koyanagi, MD;  Location: [facility];  Service: Orthopedics;  Laterality: Right;   LAPAROSCOPIC APPENDECTOMY  04-07-2011   '@[facility]'    w/ Excision peritoneal lipoma and lysis adhesions   LAPAROSCOPIC CHOLECYSTECTOMY  ~ [place] Right 10/04/2020   Procedure: LASER RETINOPEXY WITH INDIRECT LASER OPTHALMOSCOPE, RIGHT EYE;  Surgeon: Bernarda Caffey, MD;  Location: [facility];  Service: Ophthalmology;  Laterality: Right;   LIPOSUCTION WITH LIPOFILLING Bilateral 11/26/2016   Procedure: LIPOFILLING FOR SYMMETRY;   Surgeon: Wallace Going, DO;  Location: [facility];  Service: Plastics;  Laterality: Bilateral;   LIPOSUCTION WITH LIPOFILLING Bilateral 01/21/2017   Procedure: BILATERAL BREAST  LIPOFILLING FOR ASYMMETRY;  Surgeon: Wallace Going, DO;  Location: [facility];  Service: Plastics;  Laterality: Bilateral;   LIPOSUCTION WITH LIPOFILLING Bilateral 06/28/2020   Procedure: Lipofilling bilateral breasts for asymmetry;  Surgeon: Wallace Going, DO;  Location: [facility];  Service: Plastics;  Laterality: Bilateral;  90 min   MASTECTOMY Bilateral 01/09/2016   NIPPLE SPARING MASTECTOMY/SENTINAL LYMPH NODE BIOPSY/RECONSTRUCTION/PLACEMENT OF TISSUE EXPANDER Bilateral 01/09/2016   Procedure: BILATERAL NIPPLE SPARING MASTECTOMY WITH LEFT SENTINAL LYMPH NODE BIOPSY ;  Surgeon: Stark Klein, MD;  Location: [facility];  Service: General;  Laterality: Bilateral;   PHOTOCOAGULATION WITH LASER Left 10/04/2020   Procedure: PHOTOCOAGULATION WITH LASER;  Surgeon: Bernarda Caffey, MD;  Location: [facility];  Service: Ophthalmology;  Laterality: Left;   REMOVAL OF BILATERAL TISSUE EXPANDERS WITH PLACEMENT OF BILATERAL BREAST IMPLANTS Bilateral 08/20/2016   Procedure: REMOVAL OF BILATERAL TISSUE EXPANDERS WITH PLACEMENT OF BILATERAL SILICONE IMPLANTS;  Surgeon: Wallace Going, DO;  Location: [facility];  Service: Plastics;  Laterality: Bilateral;   REMOVAL OF BILATERAL TISSUE EXPANDERS WITH PLACEMENT OF BILATERAL BREAST IMPLANTS Bilateral 11/05/2017   Procedure: REMOVAL OF BILATERAL TISSUE EXPANDERS WITH PLACEMENT OF BILATERAL BREAST SILICONE IMPLANTS;  Surgeon: Wallace Going, DO;  Location: [facility];  Service: Plastics;  Laterality: Bilateral;   REMOVAL OF TISSUE EXPANDER Bilateral 02/11/2016   Procedure: REMOVAL OF BILATERAL TISSUE EXPANDERS AND FLEX HD REMOVAL;  Surgeon: Wallace Going, DO;  Location: [facility];  Service:  Plastics;  Laterality: Bilateral;   RETINAL DETACHMENT SURGERY Left 10/01/2020   Pneumatic retinopexy for repair of rheg RD - Dr. Bernarda Caffey   RETINAL DETACHMENT SURGERY Left 10/04/2020   PPV - Dr. Bernarda Caffey   SCLERAL BUCKLE Left 10/04/2020   Procedure: SCLERAL BUCKLE LEFT EYE;  Surgeon: Bernarda Caffey, MD;  Location: [facility];  Service: Ophthalmology;  Laterality: Left;   TISSUE EXPANDER PLACEMENT Bilateral 08/12/2017   Procedure: PLACEMENT OF BILATERAL TISSUE EXPANDER;  Surgeon: Wallace Going, DO;  Location: [facility];  Service: Plastics;  Laterality: Bilateral;   TOTAL KNEE  ARTHROPLASTY Right 03/31/2022   Procedure: RIGHT TOTAL KNEE REPLACEMENT;  Surgeon: Leandrew Koyanagi, MD;  Location: [facility];  Service: Orthopedics;  Laterality: Right;   TUBAL LIGATION Bilateral 1991   VITRECTOMY 25 GAUGE WITH SCLERAL BUCKLE Left 10/04/2020   Procedure: 25 GAUGE PARS PLANA VITRECTOMY LEFT EYE ;  Surgeon: Bernarda Caffey, MD;  Location: [facility];  Service: Ophthalmology;  Laterality: Left;   Social History   Occupational History   Occupation: Holiday representative: Education administrator  Tobacco Use   Smoking status: Former    Packs/day: 1.00    Years: 10.00    Total pack years: 10.00    Types: Cigarettes    Quit date: 06/08/2008    Years since quitting: 13.8   Smokeless tobacco: Never  Vaping Use   Vaping Use: Never used  Substance and Sexual Activity   Alcohol use: No   Drug use: Never   Sexual activity: Never    Birth control/protection: Surgical    Comment: hysterectomy

## 2022-04-16 ENCOUNTER — Encounter (INDEPENDENT_AMBULATORY_CARE_PROVIDER_SITE_OTHER): Payer: Self-pay

## 2022-04-18 ENCOUNTER — Ambulatory Visit: Payer: 59 | Admitting: Rehabilitative and Restorative Service Providers"

## 2022-04-21 ENCOUNTER — Encounter: Payer: Self-pay | Admitting: Rehabilitative and Restorative Service Providers"

## 2022-04-21 ENCOUNTER — Other Ambulatory Visit: Payer: Self-pay

## 2022-04-21 ENCOUNTER — Ambulatory Visit (INDEPENDENT_AMBULATORY_CARE_PROVIDER_SITE_OTHER): Payer: 59 | Admitting: Rehabilitative and Restorative Service Providers"

## 2022-04-21 DIAGNOSIS — M25561 Pain in right knee: Secondary | ICD-10-CM

## 2022-04-21 DIAGNOSIS — R600 Localized edema: Secondary | ICD-10-CM | POA: Diagnosis not present

## 2022-04-21 DIAGNOSIS — M25661 Stiffness of right knee, not elsewhere classified: Secondary | ICD-10-CM

## 2022-04-21 DIAGNOSIS — G8929 Other chronic pain: Secondary | ICD-10-CM

## 2022-04-21 DIAGNOSIS — M6281 Muscle weakness (generalized): Secondary | ICD-10-CM | POA: Diagnosis not present

## 2022-04-21 DIAGNOSIS — R262 Difficulty in walking, not elsewhere classified: Secondary | ICD-10-CM

## 2022-04-21 NOTE — Therapy (Signed)
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,  multifocal IDC, DCIS, ER/PR+,  01-09-2016 s/p bilteral mastectomies w/ left sln bx;  no chemoradiation   Cataracts, bilateral    Depression    Diabetes mellitus without complication ([place])    type 2   GAD (generalized anxiety disorder)    Gallbladder problem    History of ovarian cyst    IBS (irritable bowel syndrome)    Joint pain    PONV (postoperative nausea and vomiting)    does well with scop patch   Retinal detachment    Rheg OS   Right knee meniscal tear    Urgency of urination    Past Surgical History:  Procedure Laterality Date   ABDOMINAL HYSTERECTOMY  05/1996   endometriosis   ACHILLES TENDON REPAIR Right 2010;  revision 2011   BLADDER SUSPENSION  2000   BREAST BIOPSY Left 10/2015   BREAST IMPLANT REMOVAL Bilateral 08/12/2017   Procedure: REMOVAL BILATERAL BREAST IMPLANTS;  Surgeon: Wallace Going, DO;  Location: [place];  Service: Plastics;  Laterality: Bilateral;   BREAST RECONSTRUCTION WITH PLACEMENT OF TISSUE EXPANDER AND FLEX HD (ACELLULAR HYDRATED DERMIS) Bilateral 01/09/2016   BREAST RECONSTRUCTION WITH PLACEMENT OF TISSUE EXPANDER AND FLEX HD (ACELLULAR HYDRATED DERMIS) Bilateral 01/09/2016   Procedure: BREAST RECONSTRUCTION WITH PLACEMENT OF TISSUE EXPANDER AND FLEX HD (ACELLULAR  HYDRATED DERMIS);  Surgeon: Loel Lofty Dillingham, DO;  Location: [place];  Service: Plastics;  Laterality: Bilateral;   BREAST RECONSTRUCTION WITH PLACEMENT OF TISSUE EXPANDER AND FLEX HD (ACELLULAR HYDRATED DERMIS) Bilateral 05/29/2016   Procedure: PLACEMENT OF BILATERAL TISSUE EXPANDER AND FLEX HD (ACELLULAR HYDRATED DERMIS);  Surgeon: Wallace Going, DO;  Location: [place];  Service: Plastics;  Laterality: Bilateral;   BREAST REDUCTION SURGERY Bilateral 11/26/2016   Procedure: BILATERAL BREAST CAPSULE CONTRACTURE RELASE;  Surgeon: Wallace Going, DO;  Location: [place];  Service: Plastics;  Laterality: Bilateral;   [place]; 1989; [place] Right x3   last one 02-01-2019 @ [facility]   EYE SURGERY Left 10/01/2020   Pneumatic retinopexy for repair of rheg RD - Dr. Bernarda Caffey   EYE SURGERY Left 10/04/2020   PPV - Dr. Bernarda Caffey   FAT GRAFTING BILATERAL BREAST  08-09-2018  '@[facility]'    GAS INSERTION Left 10/04/2020   Procedure: INSERTION OF GAS;  Surgeon: Bernarda Caffey, MD;  Location: [place];  Service: Ophthalmology;  Laterality: Left;   GAS/FLUID EXCHANGE Left 10/04/2020   Procedure: GAS/FLUID EXCHANGE;  Surgeon: Bernarda Caffey, MD;  Location: [place];  Service: Ophthalmology;  Laterality: Left;   INCISION AND DRAINAGE OF WOUND Bilateral 02/11/2016   Procedure: IRRIGATION  AND DEBRIDEMENT OF BILATERAL BREAST POCKET;  Surgeon: Wallace Going, DO;  Location: [place];  Service: Plastics;  Laterality: Bilateral;   KNEE ARTHROSCOPY WITH MEDIAL MENISECTOMY Right 12/20/2019   Procedure: KNEE ARTHROSCOPY WITH MEDIAL MENISECTOMY;  Surgeon: Marchia Bond, MD;  Location: [place];  Service: Orthopedics;  Laterality: Right;   KNEE ARTHROSCOPY WITH MEDIAL MENISECTOMY Right 10/30/2021   Procedure: RIGHT KNEE ARTHROSCOPY WITH PARTIAL MEDIAL MENISECTOMY SYNOVECTOMY;  Surgeon: Leandrew Koyanagi, MD;  Location: [place];  Service: Orthopedics;  Laterality: Right;   LAPAROSCOPIC APPENDECTOMY  04-07-2011   '@[facility]'    w/ Excision peritoneal lipoma and lysis adhesions   LAPAROSCOPIC CHOLECYSTECTOMY  ~ [place] Right 10/04/2020   Procedure: LASER RETINOPEXY WITH INDIRECT LASER OPTHALMOSCOPE, RIGHT EYE;  Surgeon: Bernarda Caffey, MD;  Location: [place];  Service: Ophthalmology;  Laterality: Right;   LIPOSUCTION WITH LIPOFILLING Bilateral 11/26/2016   Procedure: LIPOFILLING FOR SYMMETRY;  Surgeon: Wallace Going, DO;  Location: [place];  Service: Plastics;  Laterality: Bilateral;   LIPOSUCTION WITH LIPOFILLING Bilateral 01/21/2017   Procedure: BILATERAL BREAST  LIPOFILLING FOR ASYMMETRY;  Surgeon: Wallace Going, DO;  Location: [place];  Service: Plastics;  Laterality: Bilateral;   LIPOSUCTION WITH LIPOFILLING Bilateral 06/28/2020   Procedure: Lipofilling bilateral breasts for asymmetry;  Surgeon: Wallace Going, DO;  Location: [place];  Service: Plastics;  Laterality: Bilateral;  90 min   MASTECTOMY Bilateral 01/09/2016   NIPPLE SPARING MASTECTOMY/SENTINAL LYMPH NODE BIOPSY/RECONSTRUCTION/PLACEMENT OF TISSUE EXPANDER Bilateral 01/09/2016   Procedure: BILATERAL NIPPLE SPARING MASTECTOMY WITH LEFT SENTINAL LYMPH NODE BIOPSY ;  Surgeon: Stark Klein, MD;  Location: [place];  Service: General;  Laterality: Bilateral;   PHOTOCOAGULATION WITH LASER Left 10/04/2020   Procedure: PHOTOCOAGULATION WITH LASER;  Surgeon: Bernarda Caffey, MD;  Location: [place];  Service: Ophthalmology;  Laterality: Left;   REMOVAL OF BILATERAL TISSUE EXPANDERS WITH PLACEMENT OF BILATERAL BREAST IMPLANTS Bilateral 08/20/2016   Procedure: REMOVAL OF BILATERAL TISSUE EXPANDERS WITH PLACEMENT OF BILATERAL SILICONE IMPLANTS;  Surgeon: Wallace Going, DO;  Location: [place];  Service: Plastics;  Laterality: Bilateral;   REMOVAL OF BILATERAL TISSUE  EXPANDERS WITH PLACEMENT OF BILATERAL BREAST IMPLANTS Bilateral 11/05/2017   Procedure: REMOVAL OF BILATERAL TISSUE EXPANDERS WITH PLACEMENT OF BILATERAL BREAST SILICONE IMPLANTS;  Surgeon: Wallace Going, DO;  Location: [place];  Service: Plastics;  Laterality: Bilateral;   REMOVAL OF TISSUE EXPANDER Bilateral 02/11/2016   Procedure: REMOVAL OF BILATERAL TISSUE EXPANDERS AND FLEX HD REMOVAL;  Surgeon: Wallace Going, DO;  Location: [place];  Service: Plastics;  Laterality: Bilateral;   RETINAL DETACHMENT SURGERY Left 10/01/2020   Pneumatic retinopexy for repair of rheg RD - Dr. Bernarda Caffey   RETINAL DETACHMENT SURGERY Left 10/04/2020   PPV - Dr. Bernarda Caffey   SCLERAL BUCKLE Left 10/04/2020   Procedure: SCLERAL BUCKLE LEFT EYE;  Surgeon: Bernarda Caffey, MD;  Location: [place];  Service: Ophthalmology;  Laterality: Left;   TISSUE EXPANDER PLACEMENT Bilateral 08/12/2017   Procedure: PLACEMENT OF BILATERAL TISSUE EXPANDER;  Surgeon: Wallace Going, DO;  Location: [place];  Service: Plastics;  Laterality: Bilateral;   TOTAL KNEE ARTHROPLASTY Right 03/31/2022   Procedure: RIGHT TOTAL KNEE REPLACEMENT;  Surgeon: Leandrew Koyanagi, MD;  Location: [place];  Service: Orthopedics;  Laterality: Right;   TUBAL LIGATION Bilateral 1991   VITRECTOMY 25  GAUGE WITH SCLERAL BUCKLE Left 10/04/2020   Procedure: 37 GAUGE PARS PLANA VITRECTOMY LEFT EYE ;  Surgeon: Bernarda Caffey, MD;  Location: [place];  Service: Ophthalmology;  Laterality: Left;   Patient Active Problem List   Diagnosis Date Noted   Status post total right knee replacement 03/31/2022   Primary osteoarthritis of right knee 03/20/2022   Eating disorder 03/20/2022   Acute medial meniscus tear, right, initial encounter 10/01/2021   At risk for dehydration 01/28/2021   At risk for malnutrition 01/01/2021   Obesity, Class I, BMI 30-34.9 12/31/2020   Vitamin B12 deficiency 10/08/2020   At risk  for depression 10/08/2020   Mood disorder ([place]), with emotional eating 09/24/2020   At risk for impaired metabolic function 29/19/1660   At risk for diabetes mellitus 07/30/2020   Vitamin D deficiency 07/02/2020   Depression 07/02/2020   At risk for side effect of medication 07/02/2020   Stress due to illness of family member-  daughter with drug addiction 05/15/2020   Depression, recurrent ([place]) 05/15/2020   Anemia 05/15/2020   Constipation 05/15/2020   Prediabetes 05/15/2020   Breast asymmetry following reconstructive surgery 04/24/2020   Acquired absence of breast 04/24/2020   S/P breast reconstruction, bilateral 04/24/2020   Acute medial meniscus tear of left knee 12/20/2019   Breast cancer, left ([place]) 01/09/2016   Genetic testing 60/12/5995   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   Family history of breast cancer in female 11/08/2015   Family history of colon cancer 11/08/2015   Malignant neoplasm of central portion of left breast in female, estrogen receptor positive ([place]) 10/31/2015   Endometriosis 03/11/2011    PCP: Maurice Small. MD  REFERRING PROVIDER: Aundra Dubin, PA-C  REFERRING DIAG: [phone] (ICD-10-CM) - Hx of total knee replacement, right  THERAPY DIAG:  Chronic pain of right knee  Muscle weakness (generalized)  Stiffness of right knee, not elsewhere classified  Localized edema  Difficulty in walking, not elsewhere classified  Rationale for Evaluation and Treatment Rehabilitation  ONSET DATE: 03/31/2022 Rt TKA  SUBJECTIVE:   SUBJECTIVE STATEMENT: S/p Rt TKA 03/31/2022.  Pt indicated feeling "terrible" since surgery.  Pt indicated swelling from knee into leg/ankle.  Pt indicated trouble sleeping due to symptoms.   Pt indicated switch to [facility] about 1 week ago.   Had home health for several weeks.  PERTINENT HISTORY: Bilateral hands and knees OA, type 2 DM, breast cancer, anxiety, 2010/11 Rt Achilles repair/revision, Rt elbow surgery, 2 previous  Rt knee scopes, obesity  PAIN:  NPRS scale: at current: 7/10 , at worst 8/10 .  Best 0/10 Pain location: Rt knee  Pain description: dull ache Aggravating factors: walking, WB activity, standing.  Relieving factors: elevation, ice, medicine OTC  PRECAUTIONS: None  WEIGHT BEARING RESTRICTIONS No  FALLS:  Has patient fallen in last 6 months? 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 (returned today for sitting)  PLOF: Independent. Housework   PATIENT GOALS     Reduce pain, get it working like it should.    OBJECTIVE:  PATIENT SURVEYS:  04/21/2022 FOTO intake: 35  predicted:  84  COGNITION: 04/21/2022  Overall cognitive status: WFL    EDEMA:  04/21/2022 Visible edema noted in knee lower leg and ankle on Rt.   MUSCLE LENGTH: 04/21/2022 None performed   PALPATION: 04/21/2022 Tenderness surrounding incision and knee jt  line Rt  LOWER EXTREMITY ROM:  ROM Right 04/21/2022 Left 04/21/2022  Hip flexion    Hip extension    Hip abduction    Hip adduction    Hip internal rotation    Hip external rotation    Knee flexion 100 in supine heel slide AROM   Knee extension -10 seated LAQ AROM   Ankle dorsiflexion    Ankle plantarflexion    Ankle inversion    Ankle eversion     (Blank rows = not tested)  LOWER EXTREMITY MMT:  MMT Right 04/21/2022 Left 04/21/2022  Hip flexion 5/5 5/5  Hip extension    Hip abduction    Hip adduction    Hip internal rotation    Hip external rotation    Knee flexion 4/5 5/5  Knee extension 3+/5 5/5  Ankle dorsiflexion 5/5 5/5  Ankle plantarflexion    Ankle inversion    Ankle eversion     (Blank rows = not tested)  LOWER EXTREMITY SPECIAL TESTS:  04/21/2022 Lt SLS 5 seconds.  Rt SLS unable assisted  FUNCTIONAL TESTS:  04/21/2022 Sit to stand to sit from 18 inch table height s UE  assist c deviation to Lt leg in movement.   GAIT: 04/21/2022 Ambulation c SPC in Lt UE.  Lateral trunk lean to Lt in stance, reduced stance on Rt leg, maintained knee flexion in stance. Antalgic gait noted.     TODAY'S TREATMENT: Therex:    HEP instruction/performance c cues for techniques, handout provided.  Trial set performed of each for comprehension and symptom assessment.  See below for exercise list    PATIENT EDUCATION:  Education details: HEP, POC Person educated: Patient Education method: Explanation, Demonstration, Verbal cues, and Handouts Education comprehension: verbalized understanding, returned demonstration, and verbal cues required    HOME EXERCISE PROGRAM: Access Code: [email or web address] URL: [email or web address] Date: 04/21/2022 Prepared by: Scot Jun  Exercises - Supine Heel Slide  - 2 x daily - 7 x weekly - 3 sets - 10 reps - 2 hold - Seated Long Arc Quad  - 2 x daily - 7 x weekly - 3 sets - 10 reps - 2 hold - Supine Knee Extension Mobilization with Weight  - 2-4 x daily - 7 x weekly - 1 sets - 1 reps - to tolerance up to 15 mins hold - Seated Knee Flexion Extension AAROM with Overpressure  - 2-3 x daily - 7 x weekly - 1 sets - 10 reps - 5 hold - Supine Quadricep Sets  - 2-3 x daily - 7 x weekly - 1 sets - 10 reps - 5 hold - Seated Passive Knee Extension with Weight  - 2-4 x daily - 7 x weekly - 1 sets - 1 reps - to tolerance hold  ASSESSMENT:  CLINICAL IMPRESSION: Patient is a 56 y.o. who comes to clinic with complaints of Rt knee pain s/p TKA with mobility, strength and movement coordination deficits that impair their ability to perform usual daily and recreational functional activities without increase difficulty/symptoms at this time.  Patient to benefit from skilled PT services to address impairments and limitations to improve to previous level of function without restriction secondary to condition.     OBJECTIVE IMPAIRMENTS Abnormal  gait, decreased activity tolerance, decreased balance, decreased coordination, decreased endurance, decreased mobility, difficulty walking, decreased ROM, decreased strength, hypomobility, increased edema, increased fascial restrictions, impaired perceived functional ability, increased muscle spasms, impaired flexibility, improper body mechanics, and pain.   ACTIVITY LIMITATIONS carrying, lifting, bending,  sitting, standing, squatting, sleeping, stairs, transfers, bed mobility, bathing, toileting, dressing, reach over head, and locomotion level  PARTICIPATION LIMITATIONS: meal prep, cleaning, laundry, interpersonal relationship, driving, shopping, community activity, and occupation  PERSONAL FACTORS 3+ comorbidities: Bilateral hands and knees OA, type 2 DM, breast cancer, anxiety, 2010/11 Rt Achilles repair/revision, Rt elbow surgery, 2 previous Rt knee scopes, obesity  are also affecting patient's functional outcome.   REHAB POTENTIAL: Good  CLINICAL DECISION MAKING: Stable/uncomplicated  EVALUATION COMPLEXITY: Low   GOALS: Goals reviewed with patient? Yes  Short term PT Goals (target date for Short term goals are 3 weeks 05/12/2022) Patient will demonstrate independent use of home exercise program to maintain progress from in clinic treatments. Goal status: New   Long term PT goals (target dates for all long term goals are 10 weeks  06/30/2022 )   1. Patient will demonstrate/report pain at worst less than or equal to 2/10 to facilitate minimal limitation in daily activity secondary to pain symptoms. Goal status: New   2. Patient will demonstrate independent use of home exercise program to facilitate ability to maintain/progress functional gains from skilled physical therapy services. Goal status: New   3. Patient will demonstrate FOTO outcome > or = 63 % to indicate reduced disability due to condition. Goal status: New   4.  Patient will demonstrate Rt LE MMT 5/5 throughout to  faciltiate usual transfers, stairs, squatting at [facility] for daily life.   Goal status: New   5. Patient will demonstrate Rt knee AROM 0-110 degrees to facilitate ability to perform transfers, sitting, ambulation, stair navigation s restriction due to mobility.   Goal status: New   6.  Patient will demonstrate independent ambulation community distances > 300 ft.   Goal status: New   7.  Patient will demonstrate/report ability to ascend/descend stairs c reciprocal gait pattern s UE assist for community integration.  a.  Goal Status: New    PLAN:  PT FREQUENCY: 2x/week  PT DURATION: 10 weeks  PLANNED INTERVENTIONS: Therapeutic exercises, Therapeutic activity, Neuro Muscular re-education, Balance training, Gait training, Patient/Family education, Joint mobilization, Stair training, DME instructions, Dry Needling, Electrical stimulation, Cryotherapy, Moist heat, Taping, Ultrasound, Ionotophoresis 53m/ml Dexamethasone, and Manual therapy.  All included unless contraindicated   PLAN FOR NEXT SESSION: Review HEP knowledge/results.  Manual for symptoms/mobility gains.  Early strengthening/motion, static balance as tolerated.  [place].    MScot Jun PT, DPT, OCS, ATC 04/21/22  4:00 PM

## 2022-04-21 NOTE — Therapy (Deleted)
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,  multifocal IDC, DCIS, ER/PR+,  01-09-2016 s/p bilteral mastectomies w/ left sln bx;  no chemoradiation   Cataracts, bilateral    Depression    Diabetes mellitus without complication ([place])    type 2   GAD (generalized anxiety disorder)    Gallbladder problem    History of ovarian cyst    IBS (irritable bowel syndrome)    Joint pain    PONV (postoperative nausea and vomiting)    does well with scop patch   Retinal detachment    Rheg OS   Right knee meniscal tear    Urgency of urination    Past Surgical History:  Procedure Laterality Date   ABDOMINAL HYSTERECTOMY  05/1996   endometriosis   ACHILLES TENDON REPAIR Right 2010;  revision 2011   BLADDER SUSPENSION  2000   BREAST BIOPSY Left 10/2015   BREAST IMPLANT REMOVAL Bilateral 08/12/2017   Procedure: REMOVAL BILATERAL BREAST IMPLANTS;  Surgeon: Wallace Going, DO;  Location: [place];  Service: Plastics;  Laterality: Bilateral;   BREAST RECONSTRUCTION WITH PLACEMENT OF TISSUE EXPANDER AND FLEX HD (ACELLULAR HYDRATED DERMIS) Bilateral 01/09/2016   BREAST RECONSTRUCTION WITH PLACEMENT OF TISSUE EXPANDER AND FLEX HD (ACELLULAR HYDRATED DERMIS) Bilateral 01/09/2016   Procedure: BREAST RECONSTRUCTION WITH PLACEMENT OF TISSUE EXPANDER AND FLEX HD (ACELLULAR HYDRATED DERMIS);  Surgeon: Loel Lofty Dillingham, DO;  Location: [place];  Service: Plastics;  Laterality: Bilateral;   BREAST RECONSTRUCTION WITH PLACEMENT OF TISSUE EXPANDER AND FLEX HD (ACELLULAR HYDRATED DERMIS) Bilateral 05/29/2016   Procedure: PLACEMENT OF BILATERAL TISSUE EXPANDER AND FLEX HD (ACELLULAR HYDRATED DERMIS);  Surgeon:  Wallace Going, DO;  Location: [place];  Service: Plastics;  Laterality: Bilateral;   BREAST REDUCTION SURGERY Bilateral 11/26/2016   Procedure: BILATERAL BREAST CAPSULE CONTRACTURE RELASE;  Surgeon: Wallace Going, DO;  Location: [place];  Service: Plastics;  Laterality: Bilateral;   [place]; 1989; [place] Right x3   last one 02-01-2019 @ [facility]   EYE SURGERY Left 10/01/2020   Pneumatic retinopexy for repair of rheg RD - Dr. Bernarda Caffey   EYE SURGERY Left 10/04/2020   PPV - Dr. Bernarda Caffey   FAT GRAFTING BILATERAL BREAST  08-09-2018  _0    GAS INSERTION Left 10/04/2020   Procedure: INSERTION OF GAS;  Surgeon: Bernarda Caffey, MD;  Location: [place];  Service: Ophthalmology;  Laterality: Left;   GAS/FLUID EXCHANGE Left 10/04/2020   Procedure: GAS/FLUID EXCHANGE;  Surgeon: Bernarda Caffey, MD;  Location: [place];  Service: Ophthalmology;  Laterality: Left;   INCISION AND DRAINAGE OF WOUND Bilateral 02/11/2016   Procedure: IRRIGATION AND DEBRIDEMENT OF BILATERAL BREAST POCKET;  Surgeon: Wallace Going, DO;  Location: [place];  Service: Plastics;  Laterality: Bilateral;   KNEE ARTHROSCOPY WITH MEDIAL MENISECTOMY Right 12/20/2019   Procedure: KNEE ARTHROSCOPY WITH MEDIAL MENISECTOMY;  Surgeon: Marchia Bond, MD;  Location: [place];  Service: Orthopedics;  Laterality: Right;   KNEE ARTHROSCOPY WITH MEDIAL MENISECTOMY Right 10/30/2021   Procedure: RIGHT KNEE ARTHROSCOPY WITH PARTIAL MEDIAL MENISECTOMY SYNOVECTOMY;  Surgeon: Frankey Shown  M, MD;  Location: [place];  Service: Orthopedics;  Laterality: Right;   LAPAROSCOPIC APPENDECTOMY  04-07-2011   _0    w/ Excision peritoneal lipoma and lysis adhesions   LAPAROSCOPIC CHOLECYSTECTOMY  ~ [place] Right 10/04/2020   Procedure: LASER RETINOPEXY WITH INDIRECT LASER OPTHALMOSCOPE, RIGHT EYE;  Surgeon: Bernarda Caffey,  MD;  Location: [place];  Service: Ophthalmology;  Laterality: Right;   LIPOSUCTION WITH LIPOFILLING Bilateral 11/26/2016   Procedure: LIPOFILLING FOR SYMMETRY;  Surgeon: Wallace Going, DO;  Location: [place];  Service: Plastics;  Laterality: Bilateral;   LIPOSUCTION WITH LIPOFILLING Bilateral 01/21/2017   Procedure: BILATERAL BREAST  LIPOFILLING FOR ASYMMETRY;  Surgeon: Wallace Going, DO;  Location: [place];  Service: Plastics;  Laterality: Bilateral;   LIPOSUCTION WITH LIPOFILLING Bilateral 06/28/2020   Procedure: Lipofilling bilateral breasts for asymmetry;  Surgeon: Wallace Going, DO;  Location: [place];  Service: Plastics;  Laterality: Bilateral;  90 min   MASTECTOMY Bilateral 01/09/2016   NIPPLE SPARING MASTECTOMY/SENTINAL LYMPH NODE BIOPSY/RECONSTRUCTION/PLACEMENT OF TISSUE EXPANDER Bilateral 01/09/2016   Procedure: BILATERAL NIPPLE SPARING MASTECTOMY WITH LEFT SENTINAL LYMPH NODE BIOPSY ;  Surgeon: Stark Klein, MD;  Location: [place];  Service: General;  Laterality: Bilateral;   PHOTOCOAGULATION WITH LASER Left 10/04/2020   Procedure: PHOTOCOAGULATION WITH LASER;  Surgeon: Bernarda Caffey, MD;  Location: [place];  Service: Ophthalmology;  Laterality: Left;   REMOVAL OF BILATERAL TISSUE EXPANDERS WITH PLACEMENT OF BILATERAL BREAST IMPLANTS Bilateral 08/20/2016   Procedure: REMOVAL OF BILATERAL TISSUE EXPANDERS WITH PLACEMENT OF BILATERAL SILICONE IMPLANTS;  Surgeon: Wallace Going, DO;  Location: [place];  Service: Plastics;  Laterality: Bilateral;   REMOVAL OF BILATERAL TISSUE EXPANDERS WITH PLACEMENT OF BILATERAL BREAST IMPLANTS Bilateral 11/05/2017   Procedure: REMOVAL OF BILATERAL TISSUE EXPANDERS WITH PLACEMENT OF BILATERAL BREAST SILICONE IMPLANTS;  Surgeon: Wallace Going, DO;  Location: [place];  Service: Plastics;  Laterality: Bilateral;   REMOVAL OF TISSUE EXPANDER Bilateral  02/11/2016   Procedure: REMOVAL OF BILATERAL TISSUE EXPANDERS AND FLEX HD REMOVAL;  Surgeon: Wallace Going, DO;  Location: [place];  Service: Plastics;  Laterality: Bilateral;   RETINAL DETACHMENT SURGERY Left 10/01/2020   Pneumatic retinopexy for repair of rheg RD - Dr. Bernarda Caffey   RETINAL DETACHMENT SURGERY Left 10/04/2020   PPV - Dr. Bernarda Caffey   SCLERAL BUCKLE Left 10/04/2020   Procedure: SCLERAL BUCKLE LEFT EYE;  Surgeon: Bernarda Caffey, MD;  Location: [place];  Service: Ophthalmology;  Laterality: Left;   TISSUE EXPANDER PLACEMENT Bilateral 08/12/2017   Procedure: PLACEMENT OF BILATERAL TISSUE EXPANDER;  Surgeon: Wallace Going, DO;  Location: [place];  Service: Plastics;  Laterality: Bilateral;   TOTAL KNEE ARTHROPLASTY Right 03/31/2022   Procedure: RIGHT TOTAL KNEE REPLACEMENT;  Surgeon: Leandrew Koyanagi, MD;  Location: [place];  Service: Orthopedics;  Laterality: Right;   TUBAL LIGATION Bilateral 1991   VITRECTOMY 25 GAUGE WITH SCLERAL BUCKLE Left 10/04/2020   Procedure: 25 GAUGE PARS PLANA VITRECTOMY LEFT EYE ;  Surgeon: Bernarda Caffey, MD;  Location: [place];  Service: Ophthalmology;  Laterality: Left;   Patient Active Problem List   Diagnosis Date Noted   Status post total right knee replacement 03/31/2022   Primary osteoarthritis of right knee 03/20/2022   Eating disorder 03/20/2022   Acute medial meniscus tear, right, initial encounter 10/01/2021   At risk for dehydration 01/28/2021  At risk for malnutrition 01/01/2021   Obesity, Class I, BMI 30-34.9 12/31/2020   Vitamin B12 deficiency 10/08/2020   At risk for depression 10/08/2020   Mood disorder ([place]), with emotional eating 09/24/2020   At risk for impaired metabolic function 80/11/4915   At risk for diabetes mellitus 07/30/2020   Vitamin D deficiency 07/02/2020   Depression 07/02/2020   At risk for side effect of medication 07/02/2020   Stress due to illness of family member-   daughter with drug addiction 05/15/2020   Depression, recurrent ([place]) 05/15/2020   Anemia 05/15/2020   Constipation 05/15/2020   Prediabetes 05/15/2020   Breast asymmetry following reconstructive surgery 04/24/2020   Acquired absence of breast 04/24/2020   S/P breast reconstruction, bilateral 04/24/2020   Acute medial meniscus tear of left knee 12/20/2019   Breast cancer, left ([place]) 01/09/2016   Genetic testing 91/50/5697   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  Rationale for Evaluation and Treatment Rehabilitation  ONSET DATE: 03/31/2022  SUBJECTIVE:   SUBJECTIVE STATEMENT: Christina [name] is a 56 y.o. year old female with a history of knee pain. Having failed conservative management, the patient elected to proceed with a total knee arthroplasty.  We have reviewed the risk and benefits of the surgery and they elected to proceed after voicing understanding.  PERTINENT HISTORY: Anemia, Arthritis, Depression, DM.   PAIN:  Are you having pain? Yes: NPRS scale: ***/10 Pain location: *** Pain description: *** Aggravating factors: *** Relieving factors: ***  PRECAUTIONS: {Therapy precautions:24002}  WEIGHT BEARING RESTRICTIONS {Yes ***/No:24003}  FALLS:  Has patient fallen in last 6 months? {fallsyesno:27318}  LIVING ENVIRONMENT: Lives with: {[facility] lives with:25569::"lives with their family"} Lives in: {Lives in:25570} Stairs: {opstairs:27293} Has following equipment at home: {Assistive devices:23999}  OCCUPATION: ***  PLOF: {PLOF:24004}  PATIENT GOALS  ***   OBJECTIVE:   DIAGNOSTIC FINDINGS: ***  PATIENT SURVEYS:  FOTO ***  COGNITION:  Overall cognitive status: {cognition:24006}     SENSATION: {sensation:27233}  EDEMA:  {edema:24020}  MUSCLE LENGTH: Hamstrings: Right *** deg; Left *** deg Thomas test: Right *** deg; Left *** deg  POSTURE: {posture:25561}  PALPATION: ***  LOWER EXTREMITY ROM:  {AROM/PROM:27142} ROM Right eval Left eval  Hip flexion    Hip extension    Hip abduction    Hip adduction    Hip internal rotation    Hip external rotation    Knee flexion    Knee extension    Ankle dorsiflexion    Ankle plantarflexion    Ankle inversion    Ankle eversion     (Blank rows = not tested)  LOWER EXTREMITY MMT:  MMT Right eval Left eval  Hip flexion    Hip extension    Hip abduction    Hip adduction    Hip internal rotation    Hip external rotation    Knee flexion    Knee extension    Ankle dorsiflexion    Ankle plantarflexion    Ankle inversion    Ankle eversion     (Blank rows = not tested)  FUNCTIONAL TESTS:  {Functional  tests:24029}  GAIT: Distance walked: *** Assistive device utilized: {Assistive devices:23999} Level of assistance: {Levels of assistance:24026} Comments: ***    TODAY'S TREATMENT: Creating, reviewing, and completing below HEP   PATIENT EDUCATION:  Education details: *** Person educated: {Person educated:25204} Education method: {Education Method:25205} Education comprehension: {Education Comprehension:25206}   HOME EXERCISE PROGRAM: ***  ASSESSMENT:  CLINICAL IMPRESSION: Patient is a 56 y.o. F who was seen today for physical therapy evaluation and treatment for S/P R TKA.    OBJECTIVE IMPAIRMENTS {opptimpairments:25111}.   ACTIVITY LIMITATIONS {activitylimitations:27494}  PARTICIPATION LIMITATIONS: {participationrestrictions:25113}  PERSONAL FACTORS {Personal factors:25162} are also affecting patient's functional outcome.   REHAB POTENTIAL:  {rehabpotential:25112}  CLINICAL DECISION MAKING: {clinical decision making:25114}  EVALUATION COMPLEXITY: {Evaluation complexity:25115}   GOALS: Goals reviewed with patient? {yes/no:20286}  SHORT TERM GOALS: Target date: {follow up:25551}  Pt will be I and compliant with initial HEP. Baseline: provided at eval Goal status: INITIAL  2.   Baseline:  Goal status: {GOALSTATUS:25110}  3.  *** Baseline:  Goal status: {GOALSTATUS:25110}  4.  *** Baseline:  Goal status: {GOALSTATUS:25110}  5.  *** Baseline:  Goal status: {GOALSTATUS:25110}  6.  *** Baseline:  Goal status: {GOALSTATUS:25110}  LONG TERM GOALS: Target date: {follow up:25551}   Pt will be independent with advanced HEP to continue to address postural limitations and muscle imbalances. Baseline: not provided Goal status: INITIAL  2.  Pt will improve FOTO function score to no less than % as proxy for functional improvement Baseline:  Goal status: INITIAL  3.  Pt will report <10 pain at baseline  Baseline:  Goal status: INITIAL Baseline:  Goal status: {GOALSTATUS:25110}  4.  *** Baseline:  Goal status: {GOALSTATUS:25110}  5.  *** Baseline:  Goal status: {GOALSTATUS:25110}  6.  *** Baseline:  Goal status: {GOALSTATUS:25110}   PLAN: PT FREQUENCY: {rehab frequency:25116}  PT DURATION: {rehab duration:25117}  PLANNED INTERVENTIONS: {rehab planned interventions:25118::"Therapeutic exercises","Therapeutic activity","Neuromuscular re-education","Balance training","Gait training","Patient/Family education","Self Care","Joint mobilization"}  PLAN FOR NEXT SESSION: Assess HEP/update PRN, continue to progress **** mobility, strengthen **** muscles. Decrease patients pain and help minimize ****    Lynden Ang, PT 04/21/2022, 12:41 PM

## 2022-04-23 ENCOUNTER — Encounter: Payer: Self-pay | Admitting: Rehabilitative and Restorative Service Providers"

## 2022-04-23 ENCOUNTER — Ambulatory Visit (INDEPENDENT_AMBULATORY_CARE_PROVIDER_SITE_OTHER): Payer: 59 | Admitting: Rehabilitative and Restorative Service Providers"

## 2022-04-23 DIAGNOSIS — M25561 Pain in right knee: Secondary | ICD-10-CM | POA: Diagnosis not present

## 2022-04-23 DIAGNOSIS — R600 Localized edema: Secondary | ICD-10-CM

## 2022-04-23 DIAGNOSIS — M6281 Muscle weakness (generalized): Secondary | ICD-10-CM

## 2022-04-23 DIAGNOSIS — R262 Difficulty in walking, not elsewhere classified: Secondary | ICD-10-CM

## 2022-04-23 DIAGNOSIS — G8929 Other chronic pain: Secondary | ICD-10-CM

## 2022-04-23 DIAGNOSIS — M25661 Stiffness of right knee, not elsewhere classified: Secondary | ICD-10-CM | POA: Diagnosis not present

## 2022-04-23 NOTE — Therapy (Signed)
OUTPATIENT PHYSICAL THERAPY TREATMENT NOTE   Patient Name: Christina [name] MRN: [id number] DOB:10-26-1965, 56 y.o., female Today's Date: 04/23/2022  PCP: Maurice Small, MD REFERRING PROVIDER: Leandrew Koyanagi, MD  END OF SESSION:   PT End of Session - 04/23/22 1439     Visit Number 2    Number of Visits 20    Date for PT Re-Evaluation 06/30/22    PT Start Time 8242    PT Stop Time 1525    PT Time Calculation (min) 48 min    Activity Tolerance Patient tolerated treatment well;No increased pain    Behavior During Therapy [facility] for tasks assessed/performed             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,  multifocal IDC, DCIS, ER/PR+,  01-09-2016 s/p bilteral mastectomies w/ left sln bx;  no chemoradiation   Cataracts, bilateral    Depression    Diabetes mellitus without complication ([place])    type 2   GAD (generalized anxiety disorder)    Gallbladder problem    History of ovarian cyst    IBS (irritable bowel syndrome)    Joint pain    PONV (postoperative nausea and vomiting)    does well with scop patch   Retinal detachment    Rheg OS   Right knee meniscal tear    Urgency of urination    Past Surgical History:  Procedure Laterality Date   ABDOMINAL HYSTERECTOMY  05/1996   endometriosis   ACHILLES TENDON REPAIR Right 2010;  revision 2011   BLADDER SUSPENSION  2000   BREAST BIOPSY Left 10/2015   BREAST IMPLANT REMOVAL Bilateral 08/12/2017   Procedure: REMOVAL BILATERAL BREAST IMPLANTS;  Surgeon: Wallace Going, DO;  Location: [place];  Service: Plastics;  Laterality: Bilateral;   BREAST RECONSTRUCTION WITH PLACEMENT OF TISSUE EXPANDER AND FLEX HD (ACELLULAR HYDRATED DERMIS) Bilateral 01/09/2016   BREAST RECONSTRUCTION WITH PLACEMENT OF TISSUE EXPANDER AND FLEX HD (ACELLULAR HYDRATED DERMIS) Bilateral 01/09/2016    Procedure: BREAST RECONSTRUCTION WITH PLACEMENT OF TISSUE EXPANDER AND FLEX HD (ACELLULAR HYDRATED DERMIS);  Surgeon: Loel Lofty Dillingham, DO;  Location: [place];  Service: Plastics;  Laterality: Bilateral;   BREAST RECONSTRUCTION WITH PLACEMENT OF TISSUE EXPANDER AND FLEX HD (ACELLULAR HYDRATED DERMIS) Bilateral 05/29/2016   Procedure: PLACEMENT OF BILATERAL TISSUE EXPANDER AND FLEX HD (ACELLULAR HYDRATED DERMIS);  Surgeon: Wallace Going, DO;  Location: [place];  Service: Plastics;  Laterality: Bilateral;   BREAST REDUCTION SURGERY Bilateral 11/26/2016   Procedure: BILATERAL BREAST CAPSULE CONTRACTURE RELASE;  Surgeon: Wallace Going, DO;  Location: [place];  Service: Plastics;  Laterality: Bilateral;   [place]; 1989; [place] Right x3   last one 02-01-2019 @ [facility]   EYE SURGERY Left 10/01/2020   Pneumatic retinopexy for repair of rheg RD - Dr. Bernarda Caffey   EYE SURGERY Left 10/04/2020   PPV - Dr. Bernarda Caffey   FAT GRAFTING BILATERAL BREAST  08-09-2018  _0    GAS INSERTION Left 10/04/2020   Procedure: INSERTION OF GAS;  Surgeon: Bernarda Caffey, MD;  Location: [place];  Service: Ophthalmology;  Laterality: Left;   GAS/FLUID EXCHANGE Left 10/04/2020   Procedure: GAS/FLUID EXCHANGE;  Surgeon: Bernarda Caffey, MD;  Location: [place];  Service: Ophthalmology;  Laterality: Left;   INCISION AND DRAINAGE OF WOUND Bilateral 02/11/2016   Procedure: IRRIGATION AND DEBRIDEMENT OF BILATERAL BREAST POCKET;  Surgeon: Wallace Going, DO;  Location: [place];  Service: Plastics;  Laterality: Bilateral;   KNEE ARTHROSCOPY WITH MEDIAL MENISECTOMY Right 12/20/2019   Procedure: KNEE ARTHROSCOPY WITH MEDIAL MENISECTOMY;  Surgeon: Marchia Bond, MD;  Location: [place];  Service: Orthopedics;  Laterality: Right;   KNEE ARTHROSCOPY WITH MEDIAL MENISECTOMY Right 10/30/2021   Procedure: RIGHT KNEE ARTHROSCOPY WITH  PARTIAL MEDIAL MENISECTOMY SYNOVECTOMY;  Surgeon: Leandrew Koyanagi, MD;  Location: [place];  Service: Orthopedics;  Laterality: Right;   LAPAROSCOPIC APPENDECTOMY  04-07-2011   _0    w/ Excision peritoneal lipoma and lysis adhesions   LAPAROSCOPIC CHOLECYSTECTOMY  ~ [place] Right 10/04/2020   Procedure: LASER RETINOPEXY WITH INDIRECT LASER OPTHALMOSCOPE, RIGHT EYE;  Surgeon: Bernarda Caffey, MD;  Location: [place];  Service: Ophthalmology;  Laterality: Right;   LIPOSUCTION WITH LIPOFILLING Bilateral 11/26/2016   Procedure: LIPOFILLING FOR SYMMETRY;  Surgeon: Wallace Going, DO;  Location: [place];  Service: Plastics;  Laterality: Bilateral;   LIPOSUCTION WITH LIPOFILLING Bilateral 01/21/2017   Procedure: BILATERAL BREAST  LIPOFILLING FOR ASYMMETRY;  Surgeon: Wallace Going, DO;  Location: [place];  Service: Plastics;  Laterality: Bilateral;   LIPOSUCTION WITH LIPOFILLING Bilateral 06/28/2020   Procedure: Lipofilling bilateral breasts for asymmetry;  Surgeon: Wallace Going, DO;  Location: [place];  Service: Plastics;  Laterality: Bilateral;  90 min   MASTECTOMY Bilateral 01/09/2016   NIPPLE SPARING MASTECTOMY/SENTINAL LYMPH NODE BIOPSY/RECONSTRUCTION/PLACEMENT OF TISSUE EXPANDER Bilateral 01/09/2016   Procedure: BILATERAL NIPPLE SPARING MASTECTOMY WITH LEFT SENTINAL LYMPH NODE BIOPSY ;  Surgeon: Stark Klein, MD;  Location: [place];  Service: General;  Laterality: Bilateral;   PHOTOCOAGULATION WITH LASER Left 10/04/2020   Procedure: PHOTOCOAGULATION WITH LASER;  Surgeon: Bernarda Caffey, MD;  Location: [place];  Service: Ophthalmology;  Laterality: Left;   REMOVAL OF BILATERAL TISSUE EXPANDERS WITH PLACEMENT OF BILATERAL BREAST IMPLANTS Bilateral 08/20/2016   Procedure: REMOVAL OF BILATERAL TISSUE EXPANDERS WITH PLACEMENT OF BILATERAL SILICONE IMPLANTS;  Surgeon: Wallace Going, DO;  Location: [place];  Service: Plastics;  Laterality: Bilateral;   REMOVAL OF BILATERAL TISSUE EXPANDERS WITH PLACEMENT OF BILATERAL BREAST IMPLANTS Bilateral 11/05/2017   Procedure: REMOVAL OF BILATERAL TISSUE EXPANDERS WITH PLACEMENT OF BILATERAL BREAST SILICONE IMPLANTS;  Surgeon: Wallace Going, DO;  Location: [place];  Service: Plastics;  Laterality: Bilateral;   REMOVAL OF TISSUE EXPANDER Bilateral 02/11/2016   Procedure: REMOVAL OF BILATERAL TISSUE EXPANDERS AND FLEX HD REMOVAL;  Surgeon: Wallace Going, DO;  Location: [place];  Service: Plastics;  Laterality: Bilateral;   RETINAL DETACHMENT SURGERY Left 10/01/2020   Pneumatic retinopexy for repair of rheg RD - Dr. Bernarda Caffey   RETINAL DETACHMENT SURGERY Left 10/04/2020   PPV - Dr. Bernarda Caffey   SCLERAL BUCKLE Left 10/04/2020   Procedure: SCLERAL BUCKLE LEFT EYE;  Surgeon: Bernarda Caffey, MD;  Location: [place];  Service: Ophthalmology;  Laterality: Left;   TISSUE EXPANDER PLACEMENT Bilateral 08/12/2017   Procedure: PLACEMENT OF BILATERAL TISSUE EXPANDER;  Surgeon: Wallace Going, DO;  Location: [place];  Service: Plastics;  Laterality: Bilateral;   TOTAL KNEE ARTHROPLASTY Right 03/31/2022   Procedure: RIGHT TOTAL KNEE REPLACEMENT;  Surgeon: Leandrew Koyanagi, MD;  Location: [place];  Service: Orthopedics;  Laterality: Right;   TUBAL LIGATION Bilateral 1991   VITRECTOMY 25 GAUGE WITH SCLERAL BUCKLE Left 10/04/2020   Procedure: 25 GAUGE PARS PLANA VITRECTOMY LEFT EYE ;  Surgeon: Bernarda Caffey, MD;  Location: [place];  Service: Ophthalmology;  Laterality: Left;   Patient Active Problem List   Diagnosis Date Noted   Status post total right knee replacement 03/31/2022   Primary osteoarthritis of right knee 03/20/2022   Eating disorder 03/20/2022   Acute medial meniscus tear, right, initial encounter 10/01/2021   At risk for dehydration 01/28/2021   At risk for malnutrition 01/01/2021    Obesity, Class I, BMI 30-34.9 12/31/2020   Vitamin B12 deficiency 10/08/2020   At risk for depression 10/08/2020   Mood disorder ([place]), with emotional eating 09/24/2020   At risk for impaired metabolic function 40/81/4481   At risk for diabetes mellitus 07/30/2020   Vitamin D deficiency 07/02/2020   Depression 07/02/2020   At risk for side effect of medication 07/02/2020   Stress due to illness of family member-  daughter with drug addiction 05/15/2020   Depression, recurrent ([place]) 05/15/2020   Anemia 05/15/2020   Constipation 05/15/2020   Prediabetes 05/15/2020   Breast asymmetry following reconstructive surgery 04/24/2020   Acquired absence of breast 04/24/2020   S/P breast reconstruction, bilateral 04/24/2020   Acute medial meniscus tear of left knee 12/20/2019   Breast cancer, left ([place]) 01/09/2016   Genetic testing 85/63/1497   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   Family history of breast cancer in female 11/08/2015   Family history of colon cancer 11/08/2015   Malignant neoplasm of central portion of left breast in female, estrogen receptor positive ([place]) 10/31/2015   Endometriosis 03/11/2011    REFERRING DIAG: W26.378 (ICD-10-CM) - Hx of total knee replacement, right  THERAPY DIAG:  Chronic pain of right knee  Muscle weakness (generalized)  Stiffness of right knee, not elsewhere classified  Localized edema  Difficulty in walking, not elsewhere classified  Rationale for Evaluation and Treatment Rehabilitation  PERTINENT HISTORY: Bilateral hands and knees OA, type 2 DM, breast cancer, anxiety, 2010/11 Rt Achilles repair/revision, Rt elbow surgery, 2 previous Rt knee scopes, obesity  PRECAUTIONS: None  SUBJECTIVE: Rima reports she has been working on her home exercises.  She is sleeping well and using a cane for ambulation.  PAIN:  Are you having pain? Yes: NPRS scale: 0-4/10 Pain location: Medial R knee Pain description: Achy, stiff Aggravating  factors: Prolonged postures, prolonged WB Relieving factors: Ice and tylenol   OBJECTIVE: (objective measures completed at initial evaluation unless otherwise dated)  OBJECTIVE:  PATIENT SURVEYS:  04/21/2022 FOTO intake: 35  predicted:  63   COGNITION: 04/21/2022            Overall cognitive status: WFL              EDEMA:  04/21/2022 Visible edema noted in knee lower leg and ankle on Rt.    MUSCLE LENGTH: 04/21/2022 None performed    PALPATION: 04/21/2022 Tenderness surrounding incision and knee jt line Rt   LOWER EXTREMITY ROM:   ROM Right 04/21/2022 Left 04/21/2022 Right 04/23/2022  Hip flexion       Hip extension       Hip abduction       Hip adduction       Hip internal rotation       Hip external rotation       Knee flexion 100 in supine heel slide AROM  108 supine AROM  Knee extension -10 seated LAQ AROM   -3 supine AROM  Ankle dorsiflexion       Ankle plantarflexion       Ankle inversion       Ankle eversion        (Blank rows = not tested)   LOWER EXTREMITY MMT:   MMT Right 04/21/2022 Left 04/21/2022  Hip flexion 5/5 5/5  Hip extension      Hip abduction      Hip adduction      Hip internal rotation      Hip external rotation      Knee flexion 4/5 5/5  Knee extension 3+/5 5/5  Ankle dorsiflexion 5/5 5/5  Ankle plantarflexion      Ankle inversion      Ankle eversion       (Blank rows = not tested)   LOWER EXTREMITY SPECIAL TESTS:  04/21/2022 Lt SLS 5 seconds.  Rt SLS unable assisted   FUNCTIONAL TESTS:  04/21/2022 Sit to stand to sit from 18 inch table height s UE assist c deviation to Lt leg in movement.    GAIT: 04/21/2022 Ambulation c SPC in Lt UE.  Lateral trunk lean to Lt in stance, reduced stance on Rt leg, maintained knee flexion in stance. Antalgic gait noted.        TODAY'S TREATMENT: 04/23/2022 Recumbent bike Seat 8 for 8 minutes full AROM Seated knee AAROM flexion and extension 10X 5 seconds each Quadriceps sets 3 sets of 10 for  5 seconds with heel prop Supine knee flexion 10X 5 seconds Verbal review of remaining HEP  Functional Activities for steps and sit to stand:  Leg Press 50# R leg only 2 sets of 10 slow eccentrics  Vaso R knee 10 minutes post-activity 34* Medium Pressure   04/21/2022 Therex:    HEP instruction/performance c cues for techniques, handout provided.  Trial set performed of each for comprehension and symptom assessment.  See below for exercise list       PATIENT EDUCATION:  Education details: HEP, POC Person educated: Patient Education method: Explanation, Demonstration, Verbal cues, and Handouts Education comprehension: verbalized understanding, returned demonstration, and verbal cues required       HOME EXERCISE PROGRAM: Access Code: [email or web address] URL: [email or web address] Date: 04/21/2022 Prepared by: Scot Jun   Exercises - Supine Heel Slide  - 2 x daily - 7 x weekly - 3 sets - 10 reps - 2 hold - Seated Long Arc Quad  - 2 x daily - 7 x weekly - 3 sets - 10 reps - 2 hold - Supine Knee Extension Mobilization with Weight  - 2-4 x daily - 7 x weekly - 1 sets - 1 reps - to tolerance up to 15 mins hold - Seated Knee Flexion Extension AAROM with Overpressure  - 2-3 x daily - 7 x weekly - 1 sets - 10 reps - 5 hold - Supine Quadricep Sets  - 2-3 x daily - 7 x weekly - 1 sets - 10 reps - 5 hold - Seated Passive Knee Extension with Weight  - 2-4 x daily - 7 x weekly - 1 sets - 1 reps - to tolerance hold   ASSESSMENT:   CLINICAL IMPRESSION: Wy[place] is making great early objective progress with her post-TKA rehabilitation.  AROM was 3 - 108 today (was 10 - 100 2 days ago).  Edema control, quadriceps strength and AROM remain the early emphasis with Makendra's PT.     OBJECTIVE  IMPAIRMENTS Abnormal gait, decreased activity tolerance, decreased balance, decreased coordination, decreased endurance, decreased mobility, difficulty walking, decreased ROM, decreased strength,  hypomobility, increased edema, increased fascial restrictions, impaired perceived functional ability, increased muscle spasms, impaired flexibility, improper body mechanics, and pain.    ACTIVITY LIMITATIONS carrying, lifting, bending, sitting, standing, squatting, sleeping, stairs, transfers, bed mobility, bathing, toileting, dressing, reach over head, and locomotion level   PARTICIPATION LIMITATIONS: meal prep, cleaning, laundry, interpersonal relationship, driving, shopping, community activity, and occupation   PERSONAL FACTORS 3+ comorbidities: Bilateral hands and knees OA, type 2 DM, breast cancer, anxiety, 2010/11 Rt Achilles repair/revision, Rt elbow surgery, 2 previous Rt knee scopes, obesity  are also affecting patient's functional outcome.    REHAB POTENTIAL: Good   CLINICAL DECISION MAKING: Stable/uncomplicated   EVALUATION COMPLEXITY: Low     GOALS: Goals reviewed with patient? Yes   Short term PT Goals (target date for Short term goals are 3 weeks 05/12/2022) Patient will demonstrate independent use of home exercise program to maintain progress from in clinic treatments. Goal status: New   Long term PT goals (target dates for all long term goals are 10 weeks  06/30/2022 )   1. Patient will demonstrate/report pain at worst less than or equal to 2/10 to facilitate minimal limitation in daily activity secondary to pain symptoms. Goal status: New   2. Patient will demonstrate independent use of home exercise program to facilitate ability to maintain/progress functional gains from skilled physical therapy services. Goal status: New   3. Patient will demonstrate FOTO outcome > or = 63 % to indicate reduced disability due to condition. Goal status: New   4.  Patient will demonstrate Rt LE MMT 5/5 throughout to faciltiate usual transfers, stairs, squatting at [facility] for daily life.    Goal status: New   5. Patient will demonstrate Rt knee AROM 0-110 degrees to facilitate ability  to perform transfers, sitting, ambulation, stair navigation s restriction due to mobility.   Goal status: New   6.  Patient will demonstrate independent ambulation community distances > 300 ft.   Goal status: New   7.  Patient will demonstrate/report ability to ascend/descend stairs c reciprocal gait pattern s UE assist for community integration.  a.  Goal Status: New       PLAN:   PT FREQUENCY: 2x/week   PT DURATION: 10 weeks   PLANNED INTERVENTIONS: Therapeutic exercises, Therapeutic activity, Neuro Muscular re-education, Balance training, Gait training, Patient/Family education, Joint mobilization, Stair training, DME instructions, Dry Needling, Electrical stimulation, Cryotherapy, Moist heat, Taping, Ultrasound, Ionotophoresis 59m/ml Dexamethasone, and Manual therapy.  All included unless contraindicated     PLAN FOR NEXT SESSION: Review HEP knowledge/results.  Manual for symptoms/mobility gains.  Early strengthening/motion, static balance as tolerated.  Vaso.       RFarley Ly PT, MPT 04/23/2022, 3:17 PM

## 2022-04-28 ENCOUNTER — Ambulatory Visit: Payer: 59 | Admitting: Physical Therapy

## 2022-04-29 ENCOUNTER — Telehealth: Payer: Self-pay | Admitting: Rehabilitative and Restorative Service Providers"

## 2022-04-29 ENCOUNTER — Encounter: Payer: 59 | Admitting: Rehabilitative and Restorative Service Providers"

## 2022-04-29 NOTE — Therapy (Deleted)
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,  multifocal IDC, DCIS, ER/PR+,  01-09-2016 s/p bilteral mastectomies w/ left sln bx;  no chemoradiation   Cataracts, bilateral    Depression    Diabetes mellitus without complication ([place])    type 2   GAD (generalized anxiety disorder)    Gallbladder problem    History of ovarian cyst    IBS (irritable bowel syndrome)    Joint pain    PONV (postoperative nausea and vomiting)    does well with scop patch   Retinal detachment    Rheg OS   Right knee meniscal tear    Urgency of urination    Past Surgical History:  Procedure Laterality Date   ABDOMINAL HYSTERECTOMY  05/1996   endometriosis   ACHILLES TENDON REPAIR Right 2010;  revision 2011   BLADDER SUSPENSION  2000   BREAST BIOPSY Left 10/2015   BREAST IMPLANT REMOVAL Bilateral 08/12/2017   Procedure: REMOVAL BILATERAL BREAST IMPLANTS;  Surgeon: Wallace Going, DO;  Location: [place];  Service: Plastics;  Laterality: Bilateral;   BREAST RECONSTRUCTION WITH PLACEMENT OF TISSUE EXPANDER AND FLEX HD (ACELLULAR HYDRATED DERMIS) Bilateral 01/09/2016   BREAST RECONSTRUCTION WITH PLACEMENT OF TISSUE EXPANDER AND FLEX HD (ACELLULAR HYDRATED DERMIS) Bilateral 01/09/2016   Procedure: BREAST RECONSTRUCTION WITH PLACEMENT OF TISSUE EXPANDER AND FLEX HD (ACELLULAR HYDRATED DERMIS);  Surgeon: Loel Lofty Dillingham, DO;  Location: [place];  Service: Plastics;  Laterality: Bilateral;   BREAST RECONSTRUCTION WITH PLACEMENT OF TISSUE EXPANDER AND FLEX HD (ACELLULAR HYDRATED DERMIS) Bilateral 05/29/2016   Procedure: PLACEMENT OF BILATERAL TISSUE EXPANDER AND FLEX HD (ACELLULAR HYDRATED DERMIS);  Surgeon:  Wallace Going, DO;  Location: [place];  Service: Plastics;  Laterality: Bilateral;   BREAST REDUCTION SURGERY Bilateral 11/26/2016   Procedure: BILATERAL BREAST CAPSULE CONTRACTURE RELASE;  Surgeon: Wallace Going, DO;  Location: [place];  Service: Plastics;  Laterality: Bilateral;   [place]; 1989; [place] Right x3   last one 02-01-2019 @ [facility]   EYE SURGERY Left 10/01/2020   Pneumatic retinopexy for repair of rheg RD - Dr. Bernarda Caffey   EYE SURGERY Left 10/04/2020   PPV - Dr. Bernarda Caffey   FAT GRAFTING BILATERAL BREAST  08-09-2018  _0    GAS INSERTION Left 10/04/2020   Procedure: INSERTION OF GAS;  Surgeon: Bernarda Caffey, MD;  Location: [place];  Service: Ophthalmology;  Laterality: Left;   GAS/FLUID EXCHANGE Left 10/04/2020   Procedure: GAS/FLUID EXCHANGE;  Surgeon: Bernarda Caffey, MD;  Location: [place];  Service: Ophthalmology;  Laterality: Left;   INCISION AND DRAINAGE OF WOUND Bilateral 02/11/2016   Procedure: IRRIGATION AND DEBRIDEMENT OF BILATERAL BREAST POCKET;  Surgeon: Wallace Going, DO;  Location: [place];  Service: Plastics;  Laterality: Bilateral;   KNEE ARTHROSCOPY WITH MEDIAL MENISECTOMY Right 12/20/2019   Procedure: KNEE ARTHROSCOPY WITH MEDIAL MENISECTOMY;  Surgeon: Marchia Bond, MD;  Location: [place];  Service: Orthopedics;  Laterality: Right;   KNEE ARTHROSCOPY WITH MEDIAL MENISECTOMY Right 10/30/2021   Procedure: RIGHT KNEE ARTHROSCOPY WITH PARTIAL MEDIAL MENISECTOMY SYNOVECTOMY;  Surgeon: Frankey Shown  M, MD;  Location: [place];  Service: Orthopedics;  Laterality: Right;   LAPAROSCOPIC APPENDECTOMY  04-07-2011   _0    w/ Excision peritoneal lipoma and lysis adhesions   LAPAROSCOPIC CHOLECYSTECTOMY  ~ [place] Right 10/04/2020   Procedure: LASER RETINOPEXY WITH INDIRECT LASER OPTHALMOSCOPE, RIGHT EYE;  Surgeon: Bernarda Caffey,  MD;  Location: [place];  Service: Ophthalmology;  Laterality: Right;   LIPOSUCTION WITH LIPOFILLING Bilateral 11/26/2016   Procedure: LIPOFILLING FOR SYMMETRY;  Surgeon: Wallace Going, DO;  Location: [place];  Service: Plastics;  Laterality: Bilateral;   LIPOSUCTION WITH LIPOFILLING Bilateral 01/21/2017   Procedure: BILATERAL BREAST  LIPOFILLING FOR ASYMMETRY;  Surgeon: Wallace Going, DO;  Location: [place];  Service: Plastics;  Laterality: Bilateral;   LIPOSUCTION WITH LIPOFILLING Bilateral 06/28/2020   Procedure: Lipofilling bilateral breasts for asymmetry;  Surgeon: Wallace Going, DO;  Location: [place];  Service: Plastics;  Laterality: Bilateral;  90 min   MASTECTOMY Bilateral 01/09/2016   NIPPLE SPARING MASTECTOMY/SENTINAL LYMPH NODE BIOPSY/RECONSTRUCTION/PLACEMENT OF TISSUE EXPANDER Bilateral 01/09/2016   Procedure: BILATERAL NIPPLE SPARING MASTECTOMY WITH LEFT SENTINAL LYMPH NODE BIOPSY ;  Surgeon: Stark Klein, MD;  Location: [place];  Service: General;  Laterality: Bilateral;   PHOTOCOAGULATION WITH LASER Left 10/04/2020   Procedure: PHOTOCOAGULATION WITH LASER;  Surgeon: Bernarda Caffey, MD;  Location: [place];  Service: Ophthalmology;  Laterality: Left;   REMOVAL OF BILATERAL TISSUE EXPANDERS WITH PLACEMENT OF BILATERAL BREAST IMPLANTS Bilateral 08/20/2016   Procedure: REMOVAL OF BILATERAL TISSUE EXPANDERS WITH PLACEMENT OF BILATERAL SILICONE IMPLANTS;  Surgeon: Wallace Going, DO;  Location: [place];  Service: Plastics;  Laterality: Bilateral;   REMOVAL OF BILATERAL TISSUE EXPANDERS WITH PLACEMENT OF BILATERAL BREAST IMPLANTS Bilateral 11/05/2017   Procedure: REMOVAL OF BILATERAL TISSUE EXPANDERS WITH PLACEMENT OF BILATERAL BREAST SILICONE IMPLANTS;  Surgeon: Wallace Going, DO;  Location: [place];  Service: Plastics;  Laterality: Bilateral;   REMOVAL OF TISSUE EXPANDER Bilateral  02/11/2016   Procedure: REMOVAL OF BILATERAL TISSUE EXPANDERS AND FLEX HD REMOVAL;  Surgeon: Wallace Going, DO;  Location: [place];  Service: Plastics;  Laterality: Bilateral;   RETINAL DETACHMENT SURGERY Left 10/01/2020   Pneumatic retinopexy for repair of rheg RD - Dr. Bernarda Caffey   RETINAL DETACHMENT SURGERY Left 10/04/2020   PPV - Dr. Bernarda Caffey   SCLERAL BUCKLE Left 10/04/2020   Procedure: SCLERAL BUCKLE LEFT EYE;  Surgeon: Bernarda Caffey, MD;  Location: [place];  Service: Ophthalmology;  Laterality: Left;   TISSUE EXPANDER PLACEMENT Bilateral 08/12/2017   Procedure: PLACEMENT OF BILATERAL TISSUE EXPANDER;  Surgeon: Wallace Going, DO;  Location: [place];  Service: Plastics;  Laterality: Bilateral;   TOTAL KNEE ARTHROPLASTY Right 03/31/2022   Procedure: RIGHT TOTAL KNEE REPLACEMENT;  Surgeon: Leandrew Koyanagi, MD;  Location: [place];  Service: Orthopedics;  Laterality: Right;   TUBAL LIGATION Bilateral 1991   VITRECTOMY 25 GAUGE WITH SCLERAL BUCKLE Left 10/04/2020   Procedure: 25 GAUGE PARS PLANA VITRECTOMY LEFT EYE ;  Surgeon: Bernarda Caffey, MD;  Location: [place];  Service: Ophthalmology;  Laterality: Left;   Patient Active Problem List   Diagnosis Date Noted   Status post total right knee replacement 03/31/2022   Primary osteoarthritis of right knee 03/20/2022   Eating disorder 03/20/2022   Acute medial meniscus tear, right, initial encounter 10/01/2021   At risk for dehydration 01/28/2021  At risk for malnutrition 01/01/2021   Obesity, Class I, BMI 30-34.9 12/31/2020   Vitamin B12 deficiency 10/08/2020   At risk for depression 10/08/2020   Mood disorder ([place]), with emotional eating 09/24/2020   At risk for impaired metabolic function 80/11/4915   At risk for diabetes mellitus 07/30/2020   Vitamin D deficiency 07/02/2020   Depression 07/02/2020   At risk for side effect of medication 07/02/2020   Stress due to illness of family member-   daughter with drug addiction 05/15/2020   Depression, recurrent ([place]) 05/15/2020   Anemia 05/15/2020   Constipation 05/15/2020   Prediabetes 05/15/2020   Breast asymmetry following reconstructive surgery 04/24/2020   Acquired absence of breast 04/24/2020   S/P breast reconstruction, bilateral 04/24/2020   Acute medial meniscus tear of left knee 12/20/2019   Breast cancer, left ([place]) 01/09/2016   Genetic testing 91/50/5697   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  Rationale for Evaluation and Treatment Rehabilitation  ONSET DATE: 03/31/2022  SUBJECTIVE:   SUBJECTIVE STATEMENT: LUJAIN KRASZEWSKI is a 56 y.o. year old female with a history of knee pain. Having failed conservative management, the patient elected to proceed with a total knee arthroplasty.  We have reviewed the risk and benefits of the surgery and they elected to proceed after voicing understanding.  PERTINENT HISTORY: Anemia, Arthritis, Depression, DM.   PAIN:  Are you having pain? Yes: NPRS scale: ***/10 Pain location: *** Pain description: *** Aggravating factors: *** Relieving factors: ***  PRECAUTIONS: {Therapy precautions:24002}  WEIGHT BEARING RESTRICTIONS {Yes ***/No:24003}  FALLS:  Has patient fallen in last 6 months? {fallsyesno:27318}  LIVING ENVIRONMENT: Lives with: {[facility] lives with:25569::"lives with their family"} Lives in: {Lives in:25570} Stairs: {opstairs:27293} Has following equipment at home: {Assistive devices:23999}  OCCUPATION: ***  PLOF: {PLOF:24004}  PATIENT GOALS  ***   OBJECTIVE:   DIAGNOSTIC FINDINGS: ***  PATIENT SURVEYS:  FOTO ***  COGNITION:  Overall cognitive status: {cognition:24006}     SENSATION: {sensation:27233}  EDEMA:  {edema:24020}  MUSCLE LENGTH: Hamstrings: Right *** deg; Left *** deg Thomas test: Right *** deg; Left *** deg  POSTURE: {posture:25561}  PALPATION: ***  LOWER EXTREMITY ROM:  {AROM/PROM:27142} ROM Right eval Left eval  Hip flexion    Hip extension    Hip abduction    Hip adduction    Hip internal rotation    Hip external rotation    Knee flexion    Knee extension    Ankle dorsiflexion    Ankle plantarflexion    Ankle inversion    Ankle eversion     (Blank rows = not tested)  LOWER EXTREMITY MMT:  MMT Right eval Left eval  Hip flexion    Hip extension    Hip abduction    Hip adduction    Hip internal rotation    Hip external rotation    Knee flexion    Knee extension    Ankle dorsiflexion    Ankle plantarflexion    Ankle inversion    Ankle eversion     (Blank rows = not tested)  FUNCTIONAL TESTS:  {Functional  tests:24029}  GAIT: Distance walked: *** Assistive device utilized: {Assistive devices:23999} Level of assistance: {Levels of assistance:24026} Comments: ***    TODAY'S TREATMENT: Creating, reviewing, and completing below HEP   PATIENT EDUCATION:  Education details: *** Person educated: {Person educated:25204} Education method: {Education Method:25205} Education comprehension: {Education Comprehension:25206}   HOME EXERCISE PROGRAM: ***  ASSESSMENT:  CLINICAL IMPRESSION: Patient is a 56 y.o. F who was seen today for physical therapy evaluation and treatment for S/P R TKA.    OBJECTIVE IMPAIRMENTS {opptimpairments:25111}.   ACTIVITY LIMITATIONS {activitylimitations:27494}  PARTICIPATION LIMITATIONS: {participationrestrictions:25113}  PERSONAL FACTORS {Personal factors:25162} are also affecting patient's functional outcome.   REHAB POTENTIAL:  {rehabpotential:25112}  CLINICAL DECISION MAKING: {clinical decision making:25114}  EVALUATION COMPLEXITY: {Evaluation complexity:25115}   GOALS: Goals reviewed with patient? {yes/no:20286}  SHORT TERM GOALS: Target date: {follow up:25551}  Pt will be I and compliant with initial HEP. Baseline: provided at eval Goal status: INITIAL  2.   Baseline:  Goal status: {GOALSTATUS:25110}  3.  *** Baseline:  Goal status: {GOALSTATUS:25110}  4.  *** Baseline:  Goal status: {GOALSTATUS:25110}  5.  *** Baseline:  Goal status: {GOALSTATUS:25110}  6.  *** Baseline:  Goal status: {GOALSTATUS:25110}  LONG TERM GOALS: Target date: {follow up:25551}   Pt will be independent with advanced HEP to continue to address postural limitations and muscle imbalances. Baseline: not provided Goal status: INITIAL  2.  Pt will improve FOTO function score to no less than % as proxy for functional improvement Baseline:  Goal status: INITIAL  3.  Pt will report <10 pain at baseline  Baseline:  Goal status: INITIAL Baseline:  Goal status: {GOALSTATUS:25110}  4.  *** Baseline:  Goal status: {GOALSTATUS:25110}  5.  *** Baseline:  Goal status: {GOALSTATUS:25110}  6.  *** Baseline:  Goal status: {GOALSTATUS:25110}   PLAN: PT FREQUENCY: {rehab frequency:25116}  PT DURATION: {rehab duration:25117}  PLANNED INTERVENTIONS: {rehab planned interventions:25118::"Therapeutic exercises","Therapeutic activity","Neuromuscular re-education","Balance training","Gait training","Patient/Family education","Self Care","Joint mobilization"}  PLAN FOR NEXT SESSION: Assess HEP/update PRN, continue to progress **** mobility, strengthen **** muscles. Decrease patients pain and help minimize ****    Lynden Ang, PT 04/29/2022, 8:28 AM

## 2022-04-29 NOTE — Therapy (Deleted)
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,  multifocal IDC, DCIS, ER/PR+,  01-09-2016 s/p bilteral mastectomies w/ left sln bx;  no chemoradiation   Cataracts, bilateral    Depression    Diabetes mellitus without complication ([place])    type 2   GAD (generalized anxiety disorder)    Gallbladder problem    History of ovarian cyst    IBS (irritable bowel syndrome)    Joint pain    PONV (postoperative nausea and vomiting)    does well with scop patch   Retinal detachment    Rheg OS   Right knee meniscal tear    Urgency of urination    Past Surgical History:  Procedure Laterality Date   ABDOMINAL HYSTERECTOMY  05/1996   endometriosis   ACHILLES TENDON REPAIR Right 2010;  revision 2011   BLADDER SUSPENSION  2000   BREAST BIOPSY Left 10/2015   BREAST IMPLANT REMOVAL Bilateral 08/12/2017   Procedure: REMOVAL BILATERAL BREAST IMPLANTS;  Surgeon: Wallace Going, DO;  Location: [place];  Service: Plastics;  Laterality: Bilateral;   BREAST RECONSTRUCTION WITH PLACEMENT OF TISSUE EXPANDER AND FLEX HD (ACELLULAR HYDRATED DERMIS) Bilateral 01/09/2016   BREAST RECONSTRUCTION WITH PLACEMENT OF TISSUE EXPANDER AND FLEX HD (ACELLULAR HYDRATED DERMIS) Bilateral 01/09/2016   Procedure: BREAST RECONSTRUCTION WITH PLACEMENT OF TISSUE EXPANDER AND FLEX HD (ACELLULAR HYDRATED DERMIS);  Surgeon: Loel Lofty Dillingham, DO;  Location: [place];  Service: Plastics;  Laterality: Bilateral;   BREAST RECONSTRUCTION WITH PLACEMENT OF TISSUE EXPANDER AND FLEX HD (ACELLULAR HYDRATED DERMIS) Bilateral 05/29/2016   Procedure: PLACEMENT OF  BILATERAL TISSUE EXPANDER AND FLEX HD (ACELLULAR HYDRATED DERMIS);  Surgeon: Wallace Going, DO;  Location: [place];  Service: Plastics;  Laterality: Bilateral;   BREAST REDUCTION SURGERY Bilateral 11/26/2016   Procedure: BILATERAL BREAST CAPSULE CONTRACTURE RELASE;  Surgeon: Wallace Going, DO;  Location: [place];  Service: Plastics;  Laterality: Bilateral;   [place]; 1989; [place] Right x3   last one 02-01-2019 @ [facility]   EYE SURGERY Left 10/01/2020   Pneumatic retinopexy for repair of rheg RD - Dr. Bernarda Caffey   EYE SURGERY Left 10/04/2020   PPV - Dr. Bernarda Caffey   FAT GRAFTING BILATERAL BREAST  08-09-2018  _0    GAS INSERTION Left 10/04/2020   Procedure: INSERTION OF GAS;  Surgeon: Bernarda Caffey, MD;  Location: [place];  Service: Ophthalmology;  Laterality: Left;   GAS/FLUID EXCHANGE Left 10/04/2020   Procedure: GAS/FLUID EXCHANGE;  Surgeon: Bernarda Caffey, MD;  Location: [place];  Service: Ophthalmology;  Laterality: Left;   INCISION AND DRAINAGE OF WOUND Bilateral 02/11/2016   Procedure: IRRIGATION AND DEBRIDEMENT OF BILATERAL BREAST POCKET;  Surgeon: Wallace Going, DO;  Location: [place];  Service: Plastics;  Laterality: Bilateral;   KNEE ARTHROSCOPY WITH MEDIAL MENISECTOMY Right 12/20/2019   Procedure: KNEE ARTHROSCOPY WITH MEDIAL MENISECTOMY;  Surgeon: Marchia Bond, MD;  Location: [place];  Service: Orthopedics;  Laterality: Right;   KNEE ARTHROSCOPY WITH MEDIAL MENISECTOMY Right 10/30/2021  Procedure: RIGHT KNEE ARTHROSCOPY WITH PARTIAL MEDIAL MENISECTOMY SYNOVECTOMY;  Surgeon: Leandrew Koyanagi, MD;  Location: [place];  Service: Orthopedics;  Laterality: Right;   LAPAROSCOPIC APPENDECTOMY  04-07-2011   _0    w/ Excision peritoneal lipoma and lysis adhesions   LAPAROSCOPIC CHOLECYSTECTOMY  ~ [place] Right 10/04/2020   Procedure: LASER  RETINOPEXY WITH INDIRECT LASER OPTHALMOSCOPE, RIGHT EYE;  Surgeon: Bernarda Caffey, MD;  Location: [place];  Service: Ophthalmology;  Laterality: Right;   LIPOSUCTION WITH LIPOFILLING Bilateral 11/26/2016   Procedure: LIPOFILLING FOR SYMMETRY;  Surgeon: Wallace Going, DO;  Location: [place];  Service: Plastics;  Laterality: Bilateral;   LIPOSUCTION WITH LIPOFILLING Bilateral 01/21/2017   Procedure: BILATERAL BREAST  LIPOFILLING FOR ASYMMETRY;  Surgeon: Wallace Going, DO;  Location: [place];  Service: Plastics;  Laterality: Bilateral;   LIPOSUCTION WITH LIPOFILLING Bilateral 06/28/2020   Procedure: Lipofilling bilateral breasts for asymmetry;  Surgeon: Wallace Going, DO;  Location: [place];  Service: Plastics;  Laterality: Bilateral;  90 min   MASTECTOMY Bilateral 01/09/2016   NIPPLE SPARING MASTECTOMY/SENTINAL LYMPH NODE BIOPSY/RECONSTRUCTION/PLACEMENT OF TISSUE EXPANDER Bilateral 01/09/2016   Procedure: BILATERAL NIPPLE SPARING MASTECTOMY WITH LEFT SENTINAL LYMPH NODE BIOPSY ;  Surgeon: Stark Klein, MD;  Location: [place];  Service: General;  Laterality: Bilateral;   PHOTOCOAGULATION WITH LASER Left 10/04/2020   Procedure: PHOTOCOAGULATION WITH LASER;  Surgeon: Bernarda Caffey, MD;  Location: [place];  Service: Ophthalmology;  Laterality: Left;   REMOVAL OF BILATERAL TISSUE EXPANDERS WITH PLACEMENT OF BILATERAL BREAST IMPLANTS Bilateral 08/20/2016   Procedure: REMOVAL OF BILATERAL TISSUE EXPANDERS WITH PLACEMENT OF BILATERAL SILICONE IMPLANTS;  Surgeon: Wallace Going, DO;  Location: [place];  Service: Plastics;  Laterality: Bilateral;   REMOVAL OF BILATERAL TISSUE EXPANDERS WITH PLACEMENT OF BILATERAL BREAST IMPLANTS Bilateral 11/05/2017   Procedure: REMOVAL OF BILATERAL TISSUE EXPANDERS WITH PLACEMENT OF BILATERAL BREAST SILICONE IMPLANTS;  Surgeon: Wallace Going, DO;  Location: [place];   Service: Plastics;  Laterality: Bilateral;   REMOVAL OF TISSUE EXPANDER Bilateral 02/11/2016   Procedure: REMOVAL OF BILATERAL TISSUE EXPANDERS AND FLEX HD REMOVAL;  Surgeon: Wallace Going, DO;  Location: [place];  Service: Plastics;  Laterality: Bilateral;   RETINAL DETACHMENT SURGERY Left 10/01/2020   Pneumatic retinopexy for repair of rheg RD - Dr. Bernarda Caffey   RETINAL DETACHMENT SURGERY Left 10/04/2020   PPV - Dr. Bernarda Caffey   SCLERAL BUCKLE Left 10/04/2020   Procedure: SCLERAL BUCKLE LEFT EYE;  Surgeon: Bernarda Caffey, MD;  Location: [place];  Service: Ophthalmology;  Laterality: Left;   TISSUE EXPANDER PLACEMENT Bilateral 08/12/2017   Procedure: PLACEMENT OF BILATERAL TISSUE EXPANDER;  Surgeon: Wallace Going, DO;  Location: [place];  Service: Plastics;  Laterality: Bilateral;   TOTAL KNEE ARTHROPLASTY Right 03/31/2022   Procedure: RIGHT TOTAL KNEE REPLACEMENT;  Surgeon: Leandrew Koyanagi, MD;  Location: [place];  Service: Orthopedics;  Laterality: Right;   TUBAL LIGATION Bilateral 1991   VITRECTOMY 25 GAUGE WITH SCLERAL BUCKLE Left 10/04/2020   Procedure: 25 GAUGE PARS PLANA VITRECTOMY LEFT EYE ;  Surgeon: Bernarda Caffey, MD;  Location: [place];  Service: Ophthalmology;  Laterality: Left;   Patient Active Problem List   Diagnosis Date Noted   Status post total right knee replacement 03/31/2022   Primary osteoarthritis of right knee 03/20/2022   Eating disorder 03/20/2022   Acute medial  meniscus tear, right, initial encounter 10/01/2021   At risk for dehydration 01/28/2021   At risk for malnutrition 01/01/2021   Obesity, Class I, BMI 30-34.9 12/31/2020   Vitamin B12 deficiency 10/08/2020   At risk for depression 10/08/2020   Mood disorder ([place]), with emotional eating 09/24/2020   At risk for impaired metabolic function 22/10/5425   At risk for diabetes mellitus 07/30/2020   Vitamin D deficiency 07/02/2020   Depression 07/02/2020   At risk for  side effect of medication 07/02/2020   Stress due to illness of family member-  daughter with drug addiction 05/15/2020   Depression, recurrent ([place]) 05/15/2020   Anemia 05/15/2020   Constipation 05/15/2020   Prediabetes 05/15/2020   Breast asymmetry following reconstructive surgery 04/24/2020   Acquired absence of breast 04/24/2020   S/P breast reconstruction, bilateral 04/24/2020   Acute medial meniscus tear of left knee 12/20/2019   Breast cancer, left ([place]) 01/09/2016   Genetic testing 03/01/7627   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   Family history of breast cancer in female 11/08/2015   Family history of colon cancer 11/08/2015   Malignant neoplasm of central portion of left breast in female, estrogen receptor positive ([place]) 10/31/2015   Endometriosis 03/11/2011    REFERRING DIAG: B15.176 (ICD-10-CM) - Hx of total knee replacement, right  THERAPY DIAG:  No diagnosis found.  Rationale for Evaluation and Treatment Rehabilitation  PERTINENT HISTORY: Bilateral hands and knees OA, type 2 DM, breast cancer, anxiety, 2010/11 Rt Achilles repair/revision, Rt elbow surgery, 2 previous Rt knee scopes, obesity  PRECAUTIONS: None  SUBJECTIVE: Srinika reports she has been working on her home exercises.  She is sleeping well and using a cane for ambulation.  PAIN:  Are you having pain? Yes: NPRS scale: 0-4/10 Pain location: Medial R knee Pain description: Achy, stiff Aggravating factors: Prolonged postures, prolonged WB Relieving factors: Ice and tylenol   OBJECTIVE: (objective measures completed at initial evaluation unless otherwise dated)  OBJECTIVE:  PATIENT SURVEYS:  04/21/2022 FOTO intake: 35  predicted:  63   COGNITION: 04/21/2022            Overall cognitive status: WFL              EDEMA:  04/21/2022 Visible edema noted in knee lower leg and ankle on Rt.    MUSCLE LENGTH: 04/21/2022 None performed    PALPATION: 04/21/2022 Tenderness surrounding incision  and knee jt line Rt   LOWER EXTREMITY ROM:   ROM Right 04/21/2022 Left 04/21/2022 Right 04/23/2022  Hip flexion       Hip extension       Hip abduction       Hip adduction       Hip internal rotation       Hip external rotation       Knee flexion 100 in supine heel slide AROM   108 supine AROM  Knee extension -10 seated LAQ AROM   -3 supine AROM  Ankle dorsiflexion       Ankle plantarflexion       Ankle inversion       Ankle eversion        (Blank rows = not tested)   LOWER EXTREMITY MMT:   MMT Right 04/21/2022 Left 04/21/2022  Hip flexion 5/5 5/5  Hip extension      Hip abduction      Hip adduction      Hip internal rotation      Hip external rotation  Knee flexion 4/5 5/5  Knee extension 3+/5 5/5  Ankle dorsiflexion 5/5 5/5  Ankle plantarflexion      Ankle inversion      Ankle eversion       (Blank rows = not tested)   LOWER EXTREMITY SPECIAL TESTS:  04/21/2022 Lt SLS 5 seconds.  Rt SLS unable assisted   FUNCTIONAL TESTS:  04/21/2022 Sit to stand to sit from 18 inch table height s UE assist c deviation to Lt leg in movement.    GAIT: 04/21/2022 Ambulation c SPC in Lt UE.  Lateral trunk lean to Lt in stance, reduced stance on Rt leg, maintained knee flexion in stance. Antalgic gait noted.        TODAY'S TREATMENT: 04/23/2022 Recumbent bike Seat 8 for 8 minutes full AROM Seated knee AAROM flexion and extension 10X 5 seconds each Quadriceps sets 3 sets of 10 for 5 seconds with heel prop Supine knee flexion 10X 5 seconds Verbal review of remaining HEP  Functional Activities for steps and sit to stand:  Leg Press 50# R leg only 2 sets of 10 slow eccentrics  Vaso R knee 10 minutes post-activity 34* Medium Pressure   04/21/2022 Therex:    HEP instruction/performance c cues for techniques, handout provided.  Trial set performed of each for comprehension and symptom assessment.  See below for exercise list       PATIENT EDUCATION:  Education details: HEP,  POC Person educated: Patient Education method: Explanation, Demonstration, Verbal cues, and Handouts Education comprehension: verbalized understanding, returned demonstration, and verbal cues required       HOME EXERCISE PROGRAM: Access Code: [email or web address] URL: [email or web address] Date: 04/21/2022 Prepared by: Scot Jun   Exercises - Supine Heel Slide  - 2 x daily - 7 x weekly - 3 sets - 10 reps - 2 hold - Seated Long Arc Quad  - 2 x daily - 7 x weekly - 3 sets - 10 reps - 2 hold - Supine Knee Extension Mobilization with Weight  - 2-4 x daily - 7 x weekly - 1 sets - 1 reps - to tolerance up to 15 mins hold - Seated Knee Flexion Extension AAROM with Overpressure  - 2-3 x daily - 7 x weekly - 1 sets - 10 reps - 5 hold - Supine Quadricep Sets  - 2-3 x daily - 7 x weekly - 1 sets - 10 reps - 5 hold - Seated Passive Knee Extension with Weight  - 2-4 x daily - 7 x weekly - 1 sets - 1 reps - to tolerance hold   ASSESSMENT:   CLINICAL IMPRESSION: Fatime is making great early objective progress with her post-TKA rehabilitation.  AROM was 3 - 108 today (was 10 - 100 2 days ago).  Edema control, quadriceps strength and AROM remain the early emphasis with Tashema's PT.     OBJECTIVE IMPAIRMENTS Abnormal gait, decreased activity tolerance, decreased balance, decreased coordination, decreased endurance, decreased mobility, difficulty walking, decreased ROM, decreased strength, hypomobility, increased edema, increased fascial restrictions, impaired perceived functional ability, increased muscle spasms, impaired flexibility, improper body mechanics, and pain.    ACTIVITY LIMITATIONS carrying, lifting, bending, sitting, standing, squatting, sleeping, stairs, transfers, bed mobility, bathing, toileting, dressing, reach over head, and locomotion level   PARTICIPATION LIMITATIONS: meal prep, cleaning, laundry, interpersonal relationship, driving, shopping, community activity, and  occupation   PERSONAL FACTORS 3+ comorbidities: Bilateral hands and knees OA, type 2 DM, breast cancer, anxiety, 2010/11 Rt Achilles repair/revision, Rt elbow surgery, 2 previous Rt  knee scopes, obesity  are also affecting patient's functional outcome.    REHAB POTENTIAL: Good   CLINICAL DECISION MAKING: Stable/uncomplicated   EVALUATION COMPLEXITY: Low     GOALS: Goals reviewed with patient? Yes   Short term PT Goals (target date for Short term goals are 3 weeks 05/12/2022) Patient will demonstrate independent use of home exercise program to maintain progress from in clinic treatments. Goal status: New   Long term PT goals (target dates for all long term goals are 10 weeks  06/30/2022 )   1. Patient will demonstrate/report pain at worst less than or equal to 2/10 to facilitate minimal limitation in daily activity secondary to pain symptoms. Goal status: New   2. Patient will demonstrate independent use of home exercise program to facilitate ability to maintain/progress functional gains from skilled physical therapy services. Goal status: New   3. Patient will demonstrate FOTO outcome > or = 63 % to indicate reduced disability due to condition. Goal status: New   4.  Patient will demonstrate Rt LE MMT 5/5 throughout to faciltiate usual transfers, stairs, squatting at [facility] for daily life.    Goal status: New   5. Patient will demonstrate Rt knee AROM 0-110 degrees to facilitate ability to perform transfers, sitting, ambulation, stair navigation s restriction due to mobility.   Goal status: New   6.  Patient will demonstrate independent ambulation community distances > 300 ft.   Goal status: New   7.  Patient will demonstrate/report ability to ascend/descend stairs c reciprocal gait pattern s UE assist for community integration.  a.  Goal Status: New       PLAN:   PT FREQUENCY: 2x/week   PT DURATION: 10 weeks   PLANNED INTERVENTIONS: Therapeutic exercises, Therapeutic  activity, Neuro Muscular re-education, Balance training, Gait training, Patient/Family education, Joint mobilization, Stair training, DME instructions, Dry Needling, Electrical stimulation, Cryotherapy, Moist heat, Taping, Ultrasound, Ionotophoresis 85m/ml Dexamethasone, and Manual therapy.  All included unless contraindicated     PLAN FOR NEXT SESSION: Review HEP knowledge/results.  Manual for symptoms/mobility gains.  Early strengthening/motion, static balance as tolerated.  Vaso.       SLynden Ang PT, MPT 04/29/2022, 8:29 AM

## 2022-04-29 NOTE — Telephone Encounter (Signed)
Unable to leave message today about no show for appointment (mailbox full).  Scot Jun, PT, DPT, OCS, ATC 04/29/22  2:51 PM

## 2022-05-01 ENCOUNTER — Telehealth: Payer: Self-pay

## 2022-05-01 IMAGING — MR MR KNEE*R* W/O CM
3 of 8 series · 9 of 40 positions shown · non-contrast
Comparison: Right knee radiographs 09/20/2021

CLINICAL DATA: Knee trauma. Internal derangement suspected.
Evaluate for medial meniscal tear.

EXAM:
MRI OF THE RIGHT KNEE WITHOUT CONTRAST
TECHNIQUE: Multiplanar, multisequence MR imaging of the knee was performed. No
intravenous contrast was administered.

[Series 5: PD fat-sat · coronal · 4.0mm · 0.16mm/px · 3 of 27 slices shown (1 of 3)]
[im 1/27]
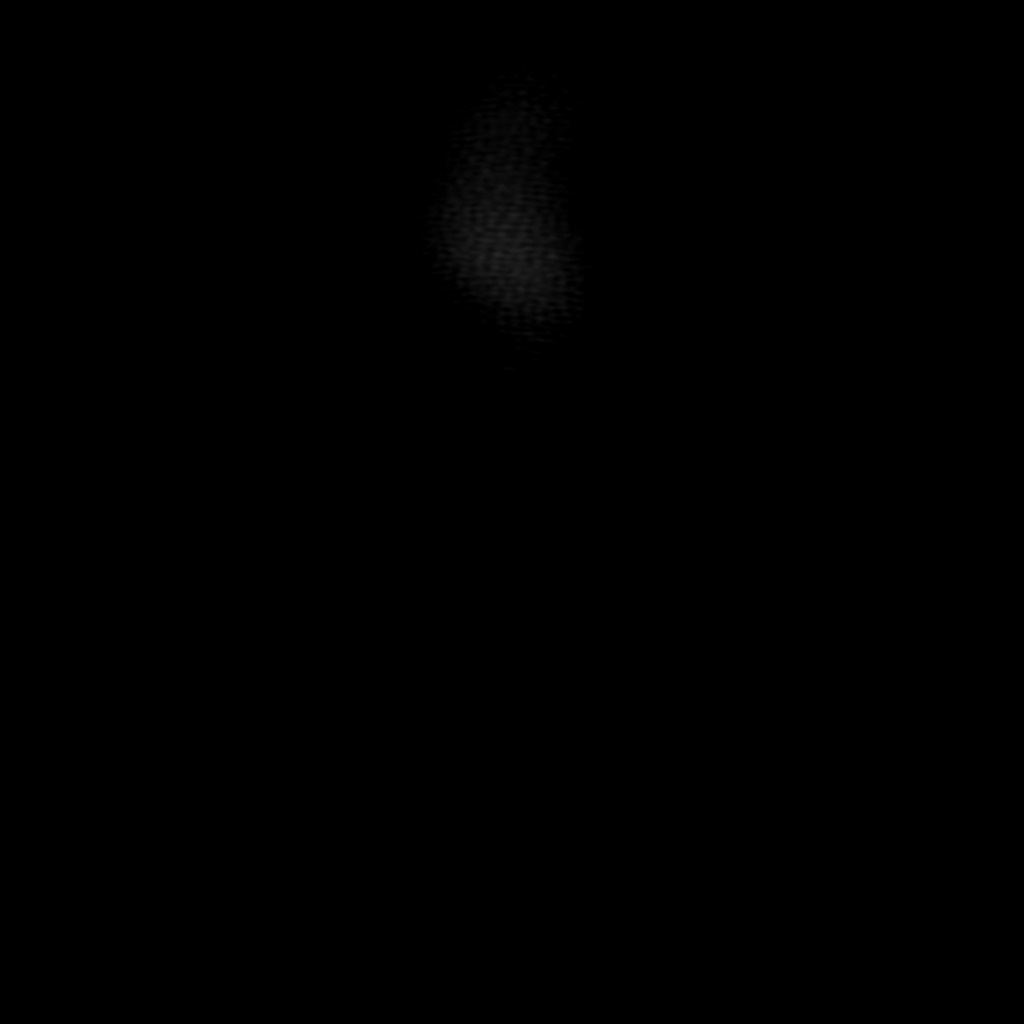
[im 18/27]
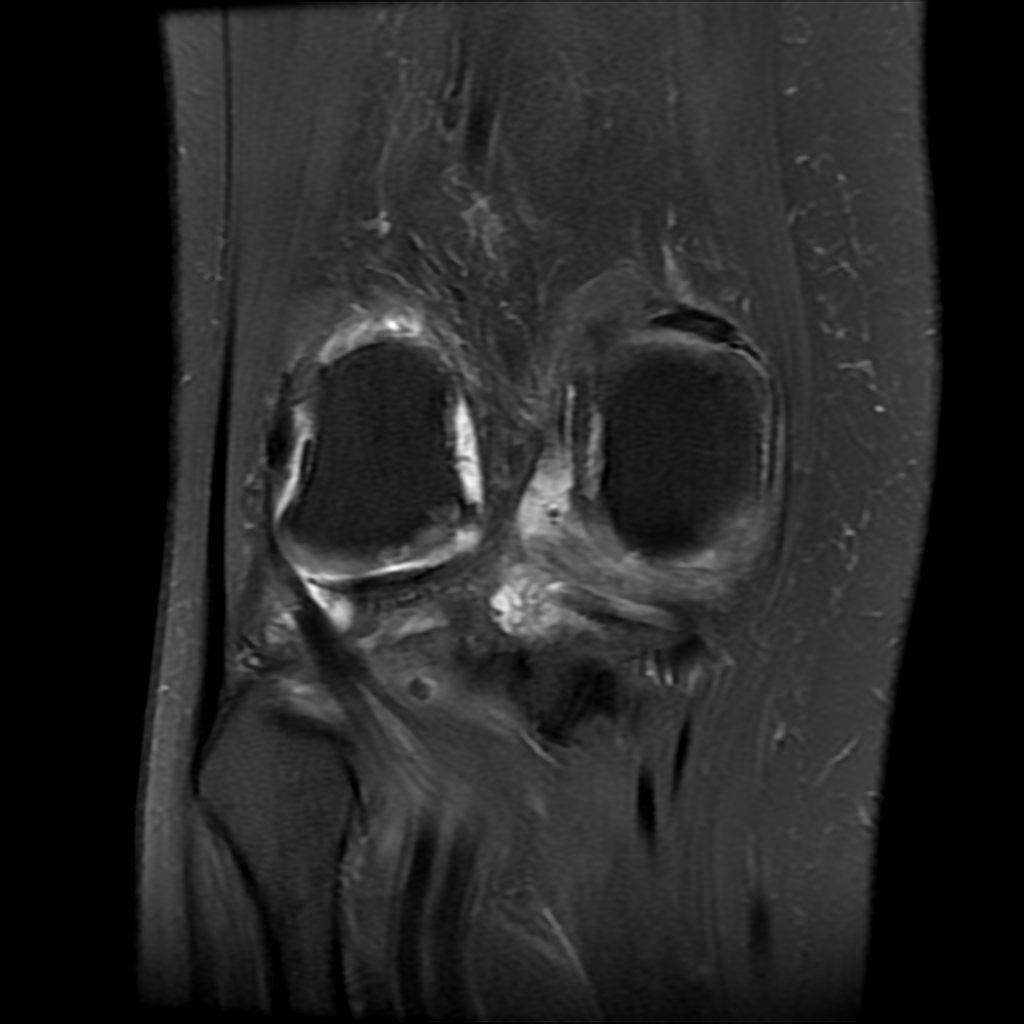
[im 27/27]
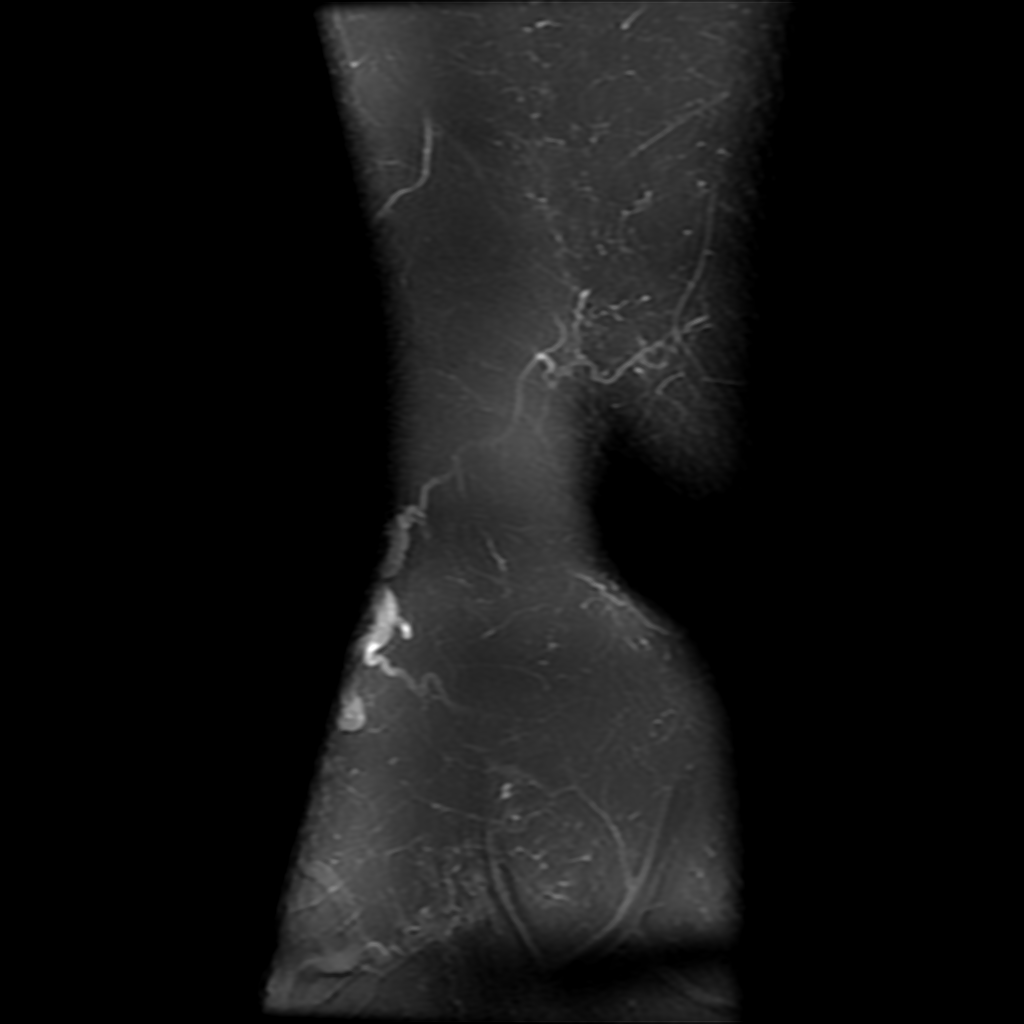

[Series 6: PD fat-sat · sagittal · 3.0mm · 0.29mm/px · 3 of 37 slices shown (2 of 3)]
[im 8/37]
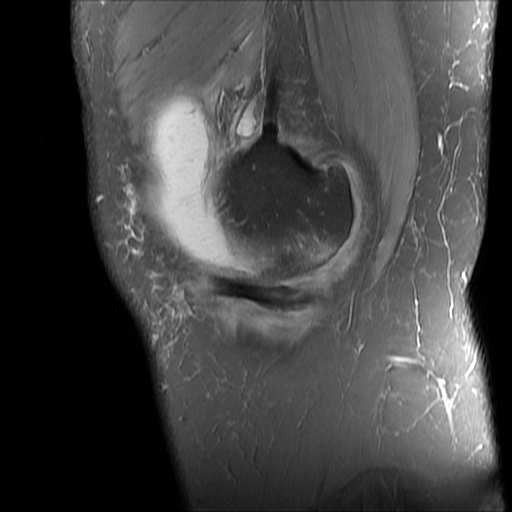
[im 22/37]
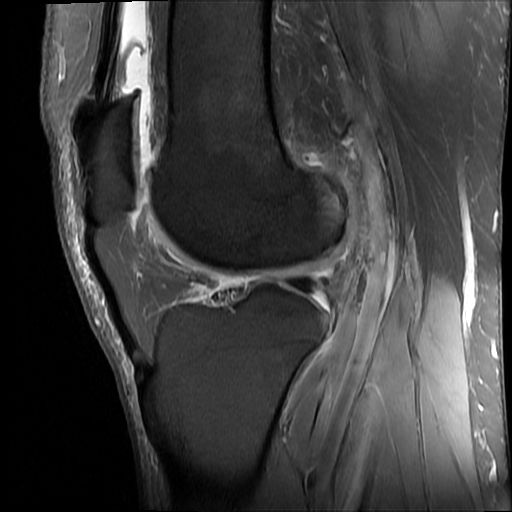
[im 37/37]
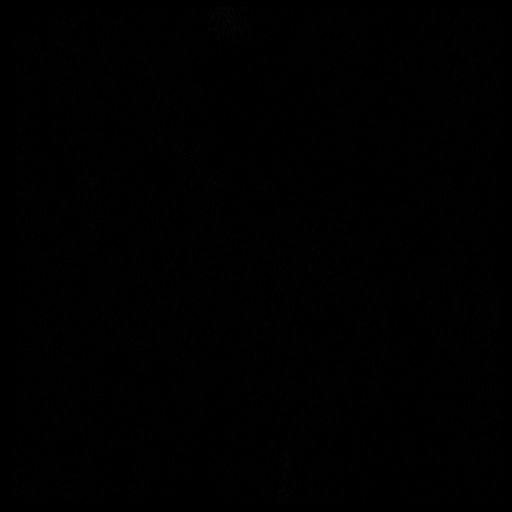

[Series 9: PD fat-sat · oblique · 2.0mm · 0.35mm/px · 3 of 19 slices shown (3 of 3)]
[im 1/19]
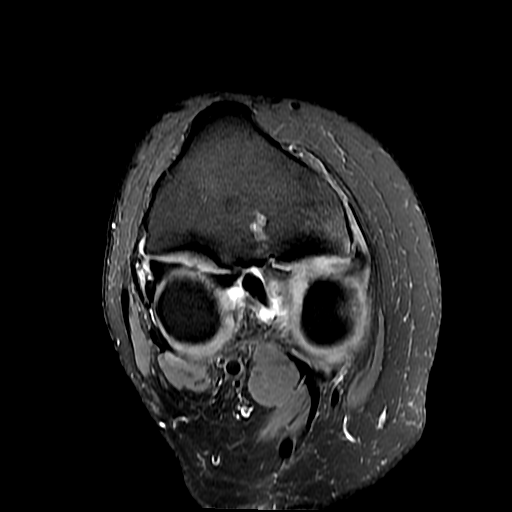
[im 10/19]
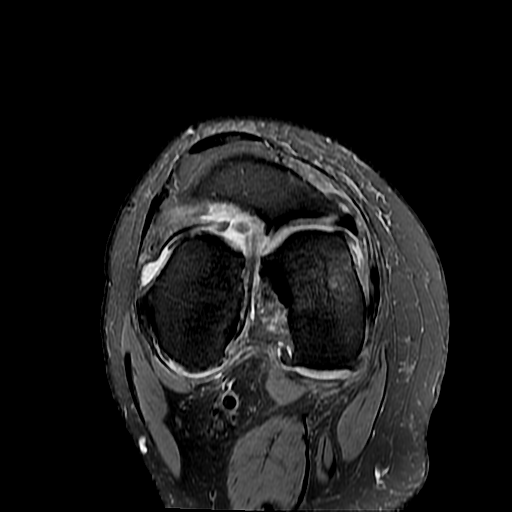
[im 19/19]
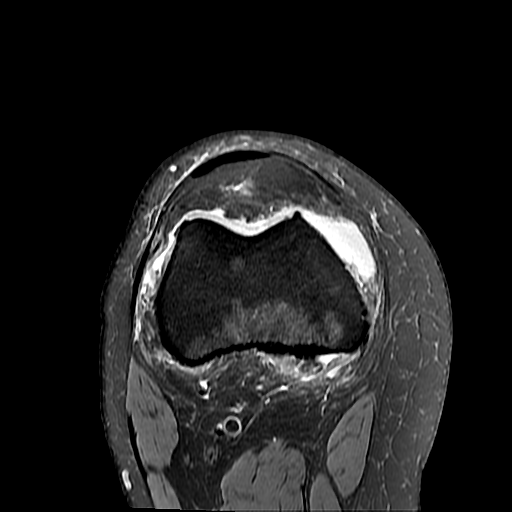

[9 of 40 positions shown; findings below may reference images not displayed]

FINDINGS: MENISCI

Medial meniscus: There is diffuse abnormal increased proton density
signal seen throughout the posterior horn of the medial meniscus
diffuse complex degenerative tears extending through the inferior
greater than superior articular surfaces of the meniscal triangle.
There is also absence of normal low signal meniscal tissue within
the posterior horn closer to the root in the region measuring up to
10 mm in transverse dimension (coronal image 17, sagittal image 24).
There is moderate extrusion of the body of the medial meniscus.
There is truncation of the central third of the meniscal triangle of
the mid AP dimension of the body of the medial meniscus.

Lateral meniscus: Mild intermediate proton density signal
intrasubstance degeneration within the root of the anterior horn of
the lateral meniscus. No tear is seen extending through the
articular surface of the lateral meniscus.

LIGAMENTS

Cruciates: The ACL and PCL are intact.

Collaterals: Mild intermediate T2 signal proximal medial collateral
ligament sprain. No fluid bright tear.

The fibular collateral ligament, biceps femoris tendon, iliotibial
band, and popliteus tendon are intact.

CARTILAGE

Patellofemoral: Full-thickness cartilage loss throughout the
superior 50% of the patellar apex cartilage with moderate
subchondral cystic change. Multifocal full-thickness cartilage loss
within the trochlear notch and medial trochlea.

Medial: There is diffuse partial-thickness and multifocal
full-thickness cartilage loss throughout the entire weight-bearing
medial femoral condyle with moderate subchondral marrow edema. Mild
peripheral medial compartment degenerative osteophytes. There is
also full-thickness cartilage loss within the far medial aspect of
the medial tibial plateau with mild subchondral marrow edema.

Lateral: There is multifocal full-thickness cartilage loss within
the weight-bearing lateral femoral condyle.

Joint: Moderate-to-largejoint effusion. Normal Hoffa's fat pad. No
plical thickening.

Popliteal Fossa:  No Baker's cyst.

Extensor Mechanism:  Intact quadriceps tendon and patellar tendon.

Bones:  No acute fracture or dislocation.

Other: There is multiloculated cystic change bordering the
posteromedial aspect of the distal femoral metadiaphysis, likely a
ganglion with a thin "neck" extending from the nearby intercondylar
notch (axial images 12 through 15).
IMPRESSION: 1. Diffuse complex degenerative change and tearing throughout the
posterior horn of the medial meniscus. Additional degenerative
truncation of the central third of the body of the medial meniscus.
2. Mild proximal medial collateral ligament sprain.
3. Severe medial and patellofemoral compartment and moderate lateral
compartment cartilage degenerative changes.
4. Moderate to large joint effusion.

## 2022-05-01 NOTE — Telephone Encounter (Signed)
Patient would like a call back concerning her right leg.  Stated that it's twice the size that it was before and wanted to know if that's normal.  Patient had right TKA, 03/31/2022.  CB# [phone].  Please advise.

## 2022-05-02 ENCOUNTER — Encounter: Payer: Self-pay | Admitting: Rehabilitative and Restorative Service Providers"

## 2022-05-02 ENCOUNTER — Ambulatory Visit (INDEPENDENT_AMBULATORY_CARE_PROVIDER_SITE_OTHER): Payer: 59 | Admitting: Rehabilitative and Restorative Service Providers"

## 2022-05-02 DIAGNOSIS — M6281 Muscle weakness (generalized): Secondary | ICD-10-CM | POA: Diagnosis not present

## 2022-05-02 DIAGNOSIS — M25561 Pain in right knee: Secondary | ICD-10-CM | POA: Diagnosis not present

## 2022-05-02 DIAGNOSIS — M25661 Stiffness of right knee, not elsewhere classified: Secondary | ICD-10-CM

## 2022-05-02 DIAGNOSIS — R262 Difficulty in walking, not elsewhere classified: Secondary | ICD-10-CM

## 2022-05-02 DIAGNOSIS — R600 Localized edema: Secondary | ICD-10-CM

## 2022-05-02 DIAGNOSIS — G8929 Other chronic pain: Secondary | ICD-10-CM

## 2022-05-02 NOTE — Telephone Encounter (Signed)
Spoke to patient.  Went back to work already and has been sitting all day at work.  Surgical site doesn't sound like it's infected.  Postsurgical edema.

## 2022-05-02 NOTE — Therapy (Signed)
OUTPATIENT PHYSICAL THERAPY TREATMENT NOTE   Patient Name: Christina [name] MRN: [id number] DOB:May 28, 1966, 56 y.o., female Today's Date: 05/02/2022  PCP: Maurice Small, MD REFERRING PROVIDER: Leandrew Koyanagi, MD  END OF SESSION:   PT End of Session - 05/02/22 0848     Visit Number 3    Number of Visits 20    Date for PT Re-Evaluation 06/30/22    PT Start Time 0806    PT Stop Time 0857    PT Time Calculation (min) 51 min    Activity Tolerance Patient tolerated treatment well    Behavior During Therapy [facility] for tasks assessed/performed              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,  multifocal IDC, DCIS, ER/PR+,  01-09-2016 s/p bilteral mastectomies w/ left sln bx;  no chemoradiation   Cataracts, bilateral    Depression    Diabetes mellitus without complication ([place])    type 2   GAD (generalized anxiety disorder)    Gallbladder problem    History of ovarian cyst    IBS (irritable bowel syndrome)    Joint pain    PONV (postoperative nausea and vomiting)    does well with scop patch   Retinal detachment    Rheg OS   Right knee meniscal tear    Urgency of urination    Past Surgical History:  Procedure Laterality Date   ABDOMINAL HYSTERECTOMY  05/1996   endometriosis   ACHILLES TENDON REPAIR Right 2010;  revision 2011   BLADDER SUSPENSION  2000   BREAST BIOPSY Left 10/2015   BREAST IMPLANT REMOVAL Bilateral 08/12/2017   Procedure: REMOVAL BILATERAL BREAST IMPLANTS;  Surgeon: Wallace Going, DO;  Location: [place];  Service: Plastics;  Laterality: Bilateral;   BREAST RECONSTRUCTION WITH PLACEMENT OF TISSUE EXPANDER AND FLEX HD (ACELLULAR HYDRATED DERMIS) Bilateral 01/09/2016   BREAST RECONSTRUCTION WITH PLACEMENT OF TISSUE EXPANDER AND FLEX HD (ACELLULAR HYDRATED DERMIS) Bilateral 01/09/2016   Procedure: BREAST  RECONSTRUCTION WITH PLACEMENT OF TISSUE EXPANDER AND FLEX HD (ACELLULAR HYDRATED DERMIS);  Surgeon: Loel Lofty Dillingham, DO;  Location: [place];  Service: Plastics;  Laterality: Bilateral;   BREAST RECONSTRUCTION WITH PLACEMENT OF TISSUE EXPANDER AND FLEX HD (ACELLULAR HYDRATED DERMIS) Bilateral 05/29/2016   Procedure: PLACEMENT OF BILATERAL TISSUE EXPANDER AND FLEX HD (ACELLULAR HYDRATED DERMIS);  Surgeon: Wallace Going, DO;  Location: [place];  Service: Plastics;  Laterality: Bilateral;   BREAST REDUCTION SURGERY Bilateral 11/26/2016   Procedure: BILATERAL BREAST CAPSULE CONTRACTURE RELASE;  Surgeon: Wallace Going, DO;  Location: [place];  Service: Plastics;  Laterality: Bilateral;   S[place]x; 1989; [place] Right x3   last one 02-01-2019 @ [facility]   EYE SURGERY Left 10/01/2020   Pneumatic retinopexy for repair of rheg RD - Dr. Bernarda Caffey   EYE SURGERY Left 10/04/2020   PPV - Dr. Bernarda Caffey   FAT GRAFTING BILATERAL BREAST  08-09-2018  $RemoveBef'@[facility]'[id number]$    GAS INSERTION Left 10/04/2020   Procedure: INSERTION OF GAS;  Surgeon: Bernarda Caffey, MD;  Location: [place];  Service: Ophthalmology;  Laterality: Left;   GAS/FLUID EXCHANGE Left 10/04/2020   Procedure: GAS/FLUID EXCHANGE;  Surgeon: Bernarda Caffey, MD;  Location: [place];  Service: Ophthalmology;  Laterality: Left;   INCISION AND DRAINAGE OF WOUND Bilateral 02/11/2016   Procedure: IRRIGATION AND DEBRIDEMENT OF BILATERAL BREAST POCKET;  Surgeon: Wallace Going, DO;  Location: [place];  Service: Plastics;  Laterality: Bilateral;   KNEE ARTHROSCOPY WITH MEDIAL MENISECTOMY Right 12/20/2019   Procedure: KNEE ARTHROSCOPY WITH MEDIAL MENISECTOMY;  Surgeon: Marchia Bond, MD;  Location: [place];  Service: Orthopedics;  Laterality: Right;   KNEE ARTHROSCOPY WITH MEDIAL MENISECTOMY Right 10/30/2021   Procedure: RIGHT KNEE ARTHROSCOPY WITH PARTIAL MEDIAL  MENISECTOMY SYNOVECTOMY;  Surgeon: Leandrew Koyanagi, MD;  Location: [place];  Service: Orthopedics;  Laterality: Right;   LAPAROSCOPIC APPENDECTOMY  04-07-2011   _0    w/ Excision peritoneal lipoma and lysis adhesions   LAPAROSCOPIC CHOLECYSTECTOMY  ~ [place] Right 10/04/2020   Procedure: LASER RETINOPEXY WITH INDIRECT LASER OPTHALMOSCOPE, RIGHT EYE;  Surgeon: Bernarda Caffey, MD;  Location: [place];  Service: Ophthalmology;  Laterality: Right;   LIPOSUCTION WITH LIPOFILLING Bilateral 11/26/2016   Procedure: LIPOFILLING FOR SYMMETRY;  Surgeon: Wallace Going, DO;  Location: [place];  Service: Plastics;  Laterality: Bilateral;   LIPOSUCTION WITH LIPOFILLING Bilateral 01/21/2017   Procedure: BILATERAL BREAST  LIPOFILLING FOR ASYMMETRY;  Surgeon: Wallace Going, DO;  Location: [place];  Service: Plastics;  Laterality: Bilateral;   LIPOSUCTION WITH LIPOFILLING Bilateral 06/28/2020   Procedure: Lipofilling bilateral breasts for asymmetry;  Surgeon: Wallace Going, DO;  Location: [place];  Service: Plastics;  Laterality: Bilateral;  90 min   MASTECTOMY Bilateral 01/09/2016   NIPPLE SPARING MASTECTOMY/SENTINAL LYMPH NODE BIOPSY/RECONSTRUCTION/PLACEMENT OF TISSUE EXPANDER Bilateral 01/09/2016   Procedure: BILATERAL NIPPLE SPARING MASTECTOMY WITH LEFT SENTINAL LYMPH NODE BIOPSY ;  Surgeon: Stark Klein, MD;  Location: [place];  Service: General;  Laterality: Bilateral;   PHOTOCOAGULATION WITH LASER Left 10/04/2020   Procedure: PHOTOCOAGULATION WITH LASER;  Surgeon: Bernarda Caffey, MD;  Location: [place];  Service: Ophthalmology;  Laterality: Left;   REMOVAL OF BILATERAL TISSUE EXPANDERS WITH PLACEMENT OF BILATERAL BREAST IMPLANTS Bilateral 08/20/2016   Procedure: REMOVAL OF BILATERAL TISSUE EXPANDERS WITH PLACEMENT OF BILATERAL SILICONE IMPLANTS;  Surgeon: Wallace Going, DO;  Location: [place];   Service: Plastics;  Laterality: Bilateral;   REMOVAL OF BILATERAL TISSUE EXPANDERS WITH PLACEMENT OF BILATERAL BREAST IMPLANTS Bilateral 11/05/2017   Procedure: REMOVAL OF BILATERAL TISSUE EXPANDERS WITH PLACEMENT OF BILATERAL BREAST SILICONE IMPLANTS;  Surgeon: Wallace Going, DO;  Location: [place];  Service: Plastics;  Laterality: Bilateral;   REMOVAL OF TISSUE EXPANDER Bilateral 02/11/2016   Procedure: REMOVAL OF BILATERAL TISSUE EXPANDERS AND FLEX HD REMOVAL;  Surgeon: Wallace Going, DO;  Location: [place];  Service: Plastics;  Laterality: Bilateral;   RETINAL DETACHMENT SURGERY Left 10/01/2020   Pneumatic retinopexy for repair of rheg RD - Dr. Bernarda Caffey   RETINAL DETACHMENT SURGERY Left 10/04/2020   PPV - Dr. Bernarda Caffey   SCLERAL BUCKLE Left 10/04/2020   Procedure: SCLERAL BUCKLE LEFT EYE;  Surgeon: Bernarda Caffey, MD;  Location: [place];  Service: Ophthalmology;  Laterality: Left;   TISSUE EXPANDER PLACEMENT Bilateral 08/12/2017   Procedure: PLACEMENT OF BILATERAL TISSUE EXPANDER;  Surgeon: Wallace Going, DO;  Location: [place];  Service: Plastics;  Laterality: Bilateral;   TOTAL KNEE ARTHROPLASTY Right 03/31/2022   Procedure: RIGHT TOTAL KNEE REPLACEMENT;  Surgeon: Leandrew Koyanagi, MD;  Location: [place];  Service: Orthopedics;  Laterality: Right;   TUBAL LIGATION Bilateral 1991   VITRECTOMY 25 GAUGE WITH SCLERAL BUCKLE Left 10/04/2020   Procedure: 25 GAUGE PARS PLANA VITRECTOMY LEFT EYE ;  Surgeon: Bernarda Caffey, MD;  Location: [place];  Service: Ophthalmology;  Laterality: Left;   Patient Active Problem List   Diagnosis Date Noted   Status post total right knee replacement 03/31/2022   Primary osteoarthritis of right knee 03/20/2022   Eating disorder 03/20/2022   Acute medial meniscus tear, right, initial encounter 10/01/2021   At risk for dehydration 01/28/2021   At risk for malnutrition 01/01/2021   Obesity, Class I,  BMI 30-34.9 12/31/2020   Vitamin B12 deficiency 10/08/2020   At risk for depression 10/08/2020   Mood disorder ([place]), with emotional eating 09/24/2020   At risk for impaired metabolic function 45/80/9983   At risk for diabetes mellitus 07/30/2020   Vitamin D deficiency 07/02/2020   Depression 07/02/2020   At risk for side effect of medication 07/02/2020   Stress due to illness of family member-  daughter with drug addiction 05/15/2020   Depression, recurrent ([place]) 05/15/2020   Anemia 05/15/2020   Constipation 05/15/2020   Prediabetes 05/15/2020   Breast asymmetry following reconstructive surgery 04/24/2020   Acquired absence of breast 04/24/2020   S/P breast reconstruction, bilateral 04/24/2020   Acute medial meniscus tear of left knee 12/20/2019   Breast cancer, left ([place]) 01/09/2016   Genetic testing 38/25/0539   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   Family history of breast cancer in female 11/08/2015   Family history of colon cancer 11/08/2015   Malignant neoplasm of central portion of left breast in female, estrogen receptor positive ([place]) 10/31/2015   Endometriosis 03/11/2011    REFERRING DIAG: J67.341 (ICD-10-CM) - Hx of total knee replacement, right  THERAPY DIAG:  Chronic pain of right knee  Muscle weakness (generalized)  Stiffness of right knee, not elsewhere classified  Localized edema  Difficulty in walking, not elsewhere classified  Rationale for Evaluation and Treatment Rehabilitation  PERTINENT HISTORY: Bilateral hands and knees OA, type 2 DM, breast cancer, anxiety, 2010/11 Rt Achilles repair/revision, Rt elbow surgery, 2 previous Rt knee scopes, obesity  PRECAUTIONS: None  SUBJECTIVE: She has been doing well over the past week but has increased knee pain and swelling. She has been working this week with sitting in most of day. She is not currently using the cane in home or community. She does not currently have access to a gym.  PAIN:  Are you  having pain? Yes: NPRS scale: 6/10, ranged 0-8/10 since last appointment Pain location: Medial Rt knee Pain description: Achy, stiff Aggravating factors: Prolonged postures, prolonged WB Relieving factors: Ice and tylenol   OBJECTIVE: (objective measures completed at initial evaluation unless otherwise dated)  OBJECTIVE:  PATIENT SURVEYS:  04/21/2022 FOTO intake: 35  predicted:  63   COGNITION: 04/21/2022            Overall cognitive status: WFL              EDEMA:  04/21/2022 Visible edema noted in knee lower leg and ankle on Rt.    MUSCLE LENGTH: 04/21/2022 None performed    PALPATION: 04/21/2022 Tenderness surrounding incision and knee jt line Rt   LOWER EXTREMITY ROM:   ROM Right 04/21/2022 Left 04/21/2022 Right 04/23/2022 Right 05/02/22  Hip flexion        Hip extension        Hip abduction  Hip adduction        Hip internal rotation        Hip external rotation        Knee flexion 100 in supine heel slide AROM   108 supine AROM 102 seated AROM 108 supine AROM  Knee extension -10 seated LAQ AROM   -3 supine AROM -10 seated LAQ AROM -4 supine leg lift AROM  Ankle dorsiflexion        Ankle plantarflexion        Ankle inversion        Ankle eversion         (Blank rows = not tested)   LOWER EXTREMITY MMT:   MMT Right 04/21/2022 Left 04/21/2022  Hip flexion 5/5 5/5  Hip extension      Hip abduction      Hip adduction      Hip internal rotation      Hip external rotation      Knee flexion 4/5 5/5  Knee extension 3+/5 5/5  Ankle dorsiflexion 5/5 5/5  Ankle plantarflexion      Ankle inversion      Ankle eversion       (Blank rows = not tested)   LOWER EXTREMITY SPECIAL TESTS:  04/21/2022 Lt SLS 5 seconds.  Rt SLS unable assisted   FUNCTIONAL TESTS:  04/21/2022 Sit to stand to sit from 18 inch table height s UE assist c deviation to Lt leg in movement.    GAIT: 04/21/2022 Ambulation c SPC in Lt UE.  Lateral trunk lean to Lt in stance, reduced  stance on Rt leg, maintained knee flexion in stance. Antalgic gait noted.        TODAY'S TREATMENT: 05/02/2022 TherEx: Recumbent bike Seat 6 for 8 minutes level 1 full AROM Leg Press 50# Rt leg only 2 sets of 10 slow eccentrics, some increased knee pain Seated straight leg raise x 10 reps, added to HEP STS with cueing for eccentric focus from 22" mat height with intermittent UE use x 6, added to HEP  Neuro Re-ed: Standing heel toe raises with no UE support x 10, reduced control with lowering Tandem stance 2 x 30s bil., few instances of single UE support to regain balance Foam normal BOS x 15s no UE Foam narrow BOS x 30s no UE, increased postural sway  TherAct: PT education on use of cane for balance and WB support outside of home, pt verbalized understanding  Self Care: PT educated on indication and options for elevation of RLE throughout day to manage swelling, pt verbalized understanding  04/23/2022 Recumbent bike Seat 8 for 8 minutes full AROM Seated knee AAROM flexion and extension 10X 5 seconds each Quadriceps sets 3 sets of 10 for 5 seconds with heel prop Supine knee flexion 10X 5 seconds Verbal review of remaining HEP  Functional Activities for steps and sit to stand:  Leg Press 50# R leg only 2 sets of 10 slow eccentrics  Vaso R knee 10 minutes post-activity 34* Medium Pressure  04/21/2022 Therex:    HEP instruction/performance c cues for techniques, handout provided.  Trial set performed of each for comprehension and symptom assessment.  See below for exercise list       PATIENT EDUCATION:  Education details: HEP, POC Person educated: Patient Education method: Explanation, Demonstration, Verbal cues, and Handouts Education comprehension: verbalized understanding, returned demonstration, and verbal cues required       HOME EXERCISE PROGRAM: Access Code: [email or web address] URL: [email or web address] Date: 05/02/2022 Prepared by:  Scot Jun  Exercises - Supine Heel Slide  - 2 x daily - 7 x weekly - 3 sets - 10 reps - 2 hold - Seated Long Arc Quad  - 2 x daily - 7 x weekly - 3 sets - 10 reps - 2 hold - Supine Knee Extension Mobilization with Weight  - 2-4 x daily - 7 x weekly - 1 sets - 1 reps - to tolerance up to 15 mins hold - Seated Knee Flexion Extension AAROM with Overpressure  - 2-3 x daily - 7 x weekly - 1 sets - 10 reps - 5 hold - Seated Passive Knee Extension with Weight  - 2-4 x daily - 7 x weekly - 1 sets - 1 reps - to tolerance hold - Sit to Stand  - 1-2 x daily - 7 x weekly - 1-2 sets - 10 reps - Seated Straight Leg Heel Taps  - 1-2 x daily - 7 x weekly - 1-2 sets - 10 reps   ASSESSMENT:   CLINICAL IMPRESSION: She is continuing to progress well, limited by moderately high pain levels today. Active range measurements remain mostly the same as the last assessment. She tolerated initial standing balance exercises well and the HEP was progressed with seated strengthening and edema management recommendations. She continues to benefit from skilled PT.   OBJECTIVE IMPAIRMENTS Abnormal gait, decreased activity tolerance, decreased balance, decreased coordination, decreased endurance, decreased mobility, difficulty walking, decreased ROM, decreased strength, hypomobility, increased edema, increased fascial restrictions, impaired perceived functional ability, increased muscle spasms, impaired flexibility, improper body mechanics, and pain.    ACTIVITY LIMITATIONS carrying, lifting, bending, sitting, standing, squatting, sleeping, stairs, transfers, bed mobility, bathing, toileting, dressing, reach over head, and locomotion level   PARTICIPATION LIMITATIONS: meal prep, cleaning, laundry, interpersonal relationship, driving, shopping, community activity, and occupation   PERSONAL FACTORS 3+ comorbidities: Bilateral hands and knees OA, type 2 DM, breast cancer, anxiety, 2010/11 Rt Achilles repair/revision, Rt elbow surgery,  2 previous Rt knee scopes, obesity  are also affecting patient's functional outcome.    REHAB POTENTIAL: Good   CLINICAL DECISION MAKING: Stable/uncomplicated   EVALUATION COMPLEXITY: Low     GOALS: Goals reviewed with patient? Yes   Short term PT Goals (target date for Short term goals are 3 weeks 05/12/2022) Patient will demonstrate independent use of home exercise program to maintain progress from in clinic treatments. Goal status: on going- 05/02/2022   Long term PT goals (target dates for all long term goals are 10 weeks  06/30/2022 )   1. Patient will demonstrate/report pain at worst less than or equal to 2/10 to facilitate minimal limitation in daily activity secondary to pain symptoms. Goal status: New   2. Patient will demonstrate independent use of home exercise program to facilitate ability to maintain/progress functional gains from skilled physical therapy services. Goal status: New   3. Patient will demonstrate FOTO outcome > or = 63 % to indicate reduced disability due to condition. Goal status: New   4.  Patient will demonstrate Rt LE MMT 5/5 throughout to faciltiate usual transfers, stairs, squatting at [facility] for daily life.    Goal status: New   5. Patient will demonstrate Rt knee AROM 0-110 degrees to facilitate ability to perform transfers, sitting, ambulation, stair navigation s restriction due to mobility.   Goal status: New   6.  Patient will demonstrate independent ambulation community distances > 300 ft.   Goal status: New   7.  Patient will demonstrate/report ability  to ascend/descend stairs c reciprocal gait pattern s UE assist for community integration.  a.  Goal Status: New       PLAN:   PT FREQUENCY: 2x/week   PT DURATION: 10 weeks   PLANNED INTERVENTIONS: Therapeutic exercises, Therapeutic activity, Neuro Muscular re-education, Balance training, Gait training, Patient/Family education, Joint mobilization, Stair training, DME instructions, Dry  Needling, Electrical stimulation, Cryotherapy, Moist heat, Taping, Ultrasound, Ionotophoresis 4mg /ml Dexamethasone, and Manual therapy.  All included unless contraindicated     PLAN FOR NEXT SESSION: Continue to progress quad strengthening and active range, static balance. Review HEP updates and progress as indicated. Vaso to end    [employer], Student-PT 05/02/2022, 9:02 AM  This entire session of physical therapy was performed under the direct supervision of PT signing evaluation /treatment. PT reviewed note and agrees.  Scot Jun, PT, DPT, OCS, ATC 05/02/2022, 9:07 AM

## 2022-05-07 ENCOUNTER — Ambulatory Visit (INDEPENDENT_AMBULATORY_CARE_PROVIDER_SITE_OTHER): Payer: 59 | Admitting: Physical Therapy

## 2022-05-07 ENCOUNTER — Encounter: Payer: Self-pay | Admitting: Physical Therapy

## 2022-05-07 DIAGNOSIS — M25661 Stiffness of right knee, not elsewhere classified: Secondary | ICD-10-CM

## 2022-05-07 DIAGNOSIS — R262 Difficulty in walking, not elsewhere classified: Secondary | ICD-10-CM

## 2022-05-07 DIAGNOSIS — G8929 Other chronic pain: Secondary | ICD-10-CM

## 2022-05-07 DIAGNOSIS — M25561 Pain in right knee: Secondary | ICD-10-CM | POA: Diagnosis not present

## 2022-05-07 DIAGNOSIS — R600 Localized edema: Secondary | ICD-10-CM | POA: Diagnosis not present

## 2022-05-07 DIAGNOSIS — M6281 Muscle weakness (generalized): Secondary | ICD-10-CM | POA: Diagnosis not present

## 2022-05-07 NOTE — Therapy (Signed)
OUTPATIENT PHYSICAL THERAPY TREATMENT NOTE   Patient Name: Christina [name] MRN: [id number] DOB:[birth year], 56 y.o., female Today's Date: 05/07/2022  PCP: Maurice Small, MD REFERRING PROVIDER: Leandrew Koyanagi, MD  END OF SESSION:   PT End of Session - 05/07/22 1603     Visit Number 4    Number of Visits 20    Date for PT Re-Evaluation 06/30/22    PT Start Time 1438    PT Stop Time 1555    PT Time Calculation (min) 39 min    Activity Tolerance Patient tolerated treatment well    Behavior During Therapy [facility] for tasks assessed/performed               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,  multifocal IDC, DCIS, ER/PR+,  01-09-2016 s/p bilteral mastectomies w/ left sln bx;  no chemoradiation   Cataracts, bilateral    Depression    Diabetes mellitus without complication ([place])    type 2   GAD (generalized anxiety disorder)    Gallbladder problem    History of ovarian cyst    IBS (irritable bowel syndrome)    Joint pain    PONV (postoperative nausea and vomiting)    does well with scop patch   Retinal detachment    Rheg OS   Right knee meniscal tear    Urgency of urination    Past Surgical History:  Procedure Laterality Date   ABDOMINAL HYSTERECTOMY  05/1996   endometriosis   ACHILLES TENDON REPAIR Right 2010;  revision 2011   BLADDER SUSPENSION  2000   BREAST BIOPSY Left 10/2015   BREAST IMPLANT REMOVAL Bilateral 08/12/2017   Procedure: REMOVAL BILATERAL BREAST IMPLANTS;  Surgeon: Wallace Going, DO;  Location: [place];  Service: Plastics;  Laterality: Bilateral;   BREAST RECONSTRUCTION WITH PLACEMENT OF TISSUE EXPANDER AND FLEX HD (ACELLULAR HYDRATED DERMIS) Bilateral 01/09/2016   BREAST RECONSTRUCTION WITH PLACEMENT OF TISSUE EXPANDER AND FLEX HD (ACELLULAR HYDRATED DERMIS) Bilateral 01/09/2016   Procedure: BREAST  RECONSTRUCTION WITH PLACEMENT OF TISSUE EXPANDER AND FLEX HD (ACELLULAR HYDRATED DERMIS);  Surgeon: Loel Lofty Dillingham, DO;  Location: [place];  Service: Plastics;  Laterality: Bilateral;   BREAST RECONSTRUCTION WITH PLACEMENT OF TISSUE EXPANDER AND FLEX HD (ACELLULAR HYDRATED DERMIS) Bilateral 05/29/2016   Procedure: PLACEMENT OF BILATERAL TISSUE EXPANDER AND FLEX HD (ACELLULAR HYDRATED DERMIS);  Surgeon: Wallace Going, DO;  Location: [place];  Service: Plastics;  Laterality: Bilateral;   BREAST REDUCTION SURGERY Bilateral 11/26/2016   Procedure: BILATERAL BREAST CAPSULE CONTRACTURE RELASE;  Surgeon: Wallace Going, DO;  Location: [place];  Service: Plastics;  Laterality: Bilateral;   [place]; 1989; [place] Right x3   last one 02-01-2019 @ [facility]   EYE SURGERY Left 10/01/2020   Pneumatic retinopexy for repair of rheg RD - Dr. Bernarda Caffey   EYE SURGERY Left 10/04/2020   PPV - Dr. Bernarda Caffey   FAT GRAFTING BILATERAL BREAST  08-09-2018  _0    GAS INSERTION Left 10/04/2020   Procedure: INSERTION OF GAS;  Surgeon: Bernarda Caffey, MD;  Location: [place];  Service: Ophthalmology;  Laterality: Left;   GAS/FLUID EXCHANGE Left 10/04/2020   Procedure: GAS/FLUID EXCHANGE;  Surgeon: Bernarda Caffey, MD;  Location: [place];  Service: Ophthalmology;  Laterality: Left;   INCISION AND DRAINAGE OF WOUND Bilateral 02/11/2016   Procedure: IRRIGATION AND DEBRIDEMENT OF BILATERAL BREAST POCKET;  Surgeon: Wallace Going, DO;  Location: [place];  Service: Plastics;  Laterality: Bilateral;   KNEE ARTHROSCOPY WITH MEDIAL MENISECTOMY Right 12/20/2019   Procedure: KNEE ARTHROSCOPY WITH MEDIAL MENISECTOMY;  Surgeon: Marchia Bond, MD;  Location: [place];  Service: Orthopedics;  Laterality: Right;   KNEE ARTHROSCOPY WITH MEDIAL MENISECTOMY Right 10/30/2021   Procedure: RIGHT KNEE ARTHROSCOPY WITH PARTIAL MEDIAL  MENISECTOMY SYNOVECTOMY;  Surgeon: Leandrew Koyanagi, MD;  Location: [place];  Service: Orthopedics;  Laterality: Right;   LAPAROSCOPIC APPENDECTOMY  04-07-2011   _0    w/ Excision peritoneal lipoma and lysis adhesions   LAPAROSCOPIC CHOLECYSTECTOMY  ~ [place] Right 10/04/2020   Procedure: LASER RETINOPEXY WITH INDIRECT LASER OPTHALMOSCOPE, RIGHT EYE;  Surgeon: Bernarda Caffey, MD;  Location: [place];  Service: Ophthalmology;  Laterality: Right;   LIPOSUCTION WITH LIPOFILLING Bilateral 11/26/2016   Procedure: LIPOFILLING FOR SYMMETRY;  Surgeon: Wallace Going, DO;  Location: [place];  Service: Plastics;  Laterality: Bilateral;   LIPOSUCTION WITH LIPOFILLING Bilateral 01/21/2017   Procedure: BILATERAL BREAST  LIPOFILLING FOR ASYMMETRY;  Surgeon: Wallace Going, DO;  Location: [place];  Service: Plastics;  Laterality: Bilateral;   LIPOSUCTION WITH LIPOFILLING Bilateral 06/28/2020   Procedure: Lipofilling bilateral breasts for asymmetry;  Surgeon: Wallace Going, DO;  Location: [place];  Service: Plastics;  Laterality: Bilateral;  90 min   MASTECTOMY Bilateral 01/09/2016   NIPPLE SPARING MASTECTOMY/SENTINAL LYMPH NODE BIOPSY/RECONSTRUCTION/PLACEMENT OF TISSUE EXPANDER Bilateral 01/09/2016   Procedure: BILATERAL NIPPLE SPARING MASTECTOMY WITH LEFT SENTINAL LYMPH NODE BIOPSY ;  Surgeon: Stark Klein, MD;  Location: [place];  Service: General;  Laterality: Bilateral;   PHOTOCOAGULATION WITH LASER Left 10/04/2020   Procedure: PHOTOCOAGULATION WITH LASER;  Surgeon: Bernarda Caffey, MD;  Location: [place];  Service: Ophthalmology;  Laterality: Left;   REMOVAL OF BILATERAL TISSUE EXPANDERS WITH PLACEMENT OF BILATERAL BREAST IMPLANTS Bilateral 08/20/2016   Procedure: REMOVAL OF BILATERAL TISSUE EXPANDERS WITH PLACEMENT OF BILATERAL SILICONE IMPLANTS;  Surgeon: Wallace Going, DO;  Location: [place];   Service: Plastics;  Laterality: Bilateral;   REMOVAL OF BILATERAL TISSUE EXPANDERS WITH PLACEMENT OF BILATERAL BREAST IMPLANTS Bilateral 11/05/2017   Procedure: REMOVAL OF BILATERAL TISSUE EXPANDERS WITH PLACEMENT OF BILATERAL BREAST SILICONE IMPLANTS;  Surgeon: Wallace Going, DO;  Location: [place];  Service: Plastics;  Laterality: Bilateral;   REMOVAL OF TISSUE EXPANDER Bilateral 02/11/2016   Procedure: REMOVAL OF BILATERAL TISSUE EXPANDERS AND FLEX HD REMOVAL;  Surgeon: Wallace Going, DO;  Location: [place];  Service: Plastics;  Laterality: Bilateral;   RETINAL DETACHMENT SURGERY Left 10/01/2020   Pneumatic retinopexy for repair of rheg RD - Dr. Bernarda Caffey   RETINAL DETACHMENT SURGERY Left 10/04/2020   PPV - Dr. Bernarda Caffey   SCLERAL BUCKLE Left 10/04/2020   Procedure: SCLERAL BUCKLE LEFT EYE;  Surgeon: Bernarda Caffey, MD;  Location: [place];  Service: Ophthalmology;  Laterality: Left;   TISSUE EXPANDER PLACEMENT Bilateral 08/12/2017   Procedure: PLACEMENT OF BILATERAL TISSUE EXPANDER;  Surgeon: Wallace Going, DO;  Location: [place];  Service: Plastics;  Laterality: Bilateral;   TOTAL KNEE ARTHROPLASTY Right 03/31/2022   Procedure: RIGHT TOTAL KNEE REPLACEMENT;  Surgeon: Leandrew Koyanagi, MD;  Location: [place];  Service: Orthopedics;  Laterality: Right;   TUBAL LIGATION Bilateral 1991   VITRECTOMY 25 GAUGE WITH SCLERAL BUCKLE Left 10/04/2020   Procedure: 25 GAUGE PARS PLANA VITRECTOMY LEFT EYE ;  Surgeon: Bernarda Caffey, MD;  Location: [place];  Service: Ophthalmology;  Laterality: Left;   Patient Active Problem List   Diagnosis Date Noted   Status post total right knee replacement 03/31/2022   Primary osteoarthritis of right knee 03/20/2022   Eating disorder 03/20/2022   Acute medial meniscus tear, right, initial encounter 10/01/2021   At risk for dehydration 01/28/2021   At risk for malnutrition 01/01/2021   Obesity, Class I,  BMI 30-34.9 12/31/2020   Vitamin B12 deficiency 10/08/2020   At risk for depression 10/08/2020   Mood disorder ([place]), with emotional eating 09/24/2020   At risk for impaired metabolic function 85/92/9244   At risk for diabetes mellitus 07/30/2020   Vitamin D deficiency 07/02/2020   Depression 07/02/2020   At risk for side effect of medication 07/02/2020   Stress due to illness of family member-  daughter with drug addiction 05/15/2020   Depression, recurrent ([place]) 05/15/2020   Anemia 05/15/2020   Constipation 05/15/2020   Prediabetes 05/15/2020   Breast asymmetry following reconstructive surgery 04/24/2020   Acquired absence of breast 04/24/2020   S/P breast reconstruction, bilateral 04/24/2020   Acute medial meniscus tear of left knee 12/20/2019   Breast cancer, left ([place]) 01/09/2016   Genetic testing 62/86/3817   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   Family history of breast cancer in female 11/08/2015   Family history of colon cancer 11/08/2015   Malignant neoplasm of central portion of left breast in female, estrogen receptor positive ([place]) 10/31/2015   Endometriosis 03/11/2011    REFERRING DIAG: R11.657 (ICD-10-CM) - Hx of total knee replacement, right  THERAPY DIAG:  Chronic pain of right knee  Muscle weakness (generalized)  Stiffness of right knee, not elsewhere classified  Localized edema  Difficulty in walking, not elsewhere classified  Rationale for Evaluation and Treatment Rehabilitation  PERTINENT HISTORY: Bilateral hands and knees OA, type 2 DM, breast cancer, anxiety, 2010/11 Rt Achilles repair/revision, Rt elbow surgery, 2 previous Rt knee scopes, obesity  PRECAUTIONS: None  SUBJECTIVE: I was able to walk earlier today with no pain,it felt weird. Nothing else new going on today. Saw the doctor about this swelling in my leg last week he said it was normal and to continue PT.   PAIN:  Are you having pain? Yes: NPRS scale: 0/10, ranged 0-8/10 since  last appointment Pain location: Medial Rt knee Pain description: Achy, stiff Aggravating factors: Prolonged postures, prolonged WB Relieving factors: Ice and tylenol   OBJECTIVE: (objective measures completed at initial evaluation unless otherwise dated)  OBJECTIVE:  PATIENT SURVEYS:  04/21/2022 FOTO intake: 35  predicted:  63   COGNITION: 04/21/2022            Overall cognitive status: WFL              EDEMA:  04/21/2022 Visible edema noted in knee lower leg and ankle on Rt.    MUSCLE LENGTH: 04/21/2022 None performed    PALPATION: 04/21/2022 Tenderness surrounding incision and knee jt line Rt   LOWER EXTREMITY ROM:   ROM Right 04/21/2022 Left 04/21/2022 Right 04/23/2022 Right 05/02/22  Hip flexion        Hip extension        Hip abduction        Hip adduction  Hip internal rotation        Hip external rotation        Knee flexion 100 in supine heel slide AROM   108 supine AROM 102 seated AROM 108 supine AROM  Knee extension -10 seated LAQ AROM   -3 supine AROM -10 seated LAQ AROM -4 supine leg lift AROM  Ankle dorsiflexion        Ankle plantarflexion        Ankle inversion        Ankle eversion         (Blank rows = not tested)   LOWER EXTREMITY MMT:   MMT Right 04/21/2022 Left 04/21/2022  Hip flexion 5/5 5/5  Hip extension      Hip abduction      Hip adduction      Hip internal rotation      Hip external rotation      Knee flexion 4/5 5/5  Knee extension 3+/5 5/5  Ankle dorsiflexion 5/5 5/5  Ankle plantarflexion      Ankle inversion      Ankle eversion       (Blank rows = not tested)   LOWER EXTREMITY SPECIAL TESTS:  04/21/2022 Lt SLS 5 seconds.  Rt SLS unable assisted   FUNCTIONAL TESTS:  04/21/2022 Sit to stand to sit from 18 inch table height s UE assist c deviation to Lt leg in movement.    GAIT: 04/21/2022 Ambulation c SPC in Lt UE.  Lateral trunk lean to Lt in stance, reduced stance on Rt leg, maintained knee flexion in stance.  Antalgic gait noted.        TODAY'S TREATMENT:  05/07/22  Scifit bike for ROM seat 11--> seat 9 full rotations  Retrograde massage for edema LE elevated  STM to R quad   Overpressure into extension and flexion x10 each   Tandem stance 3x30 seconds B  Narrow BOS eyes shut 30 seconds x3  Box taps while standing on blue foam pad x30   Ongoing education about edema management, use of vaso, elevation, etc   05/02/2022 TherEx: Recumbent bike Seat 6 for 8 minutes level 1 full AROM Leg Press 50# Rt leg only 2 sets of 10 slow eccentrics, some increased knee pain Seated straight leg raise x 10 reps, added to HEP STS with cueing for eccentric focus from 22" mat height with intermittent UE use x 6, added to HEP  Neuro Re-ed: Standing heel toe raises with no UE support x 10, reduced control with lowering Tandem stance 2 x 30s bil., few instances of single UE support to regain balance Foam normal BOS x 15s no UE Foam narrow BOS x 30s no UE, increased postural sway  TherAct: PT education on use of cane for balance and WB support outside of home, pt verbalized understanding  Self Care: PT educated on indication and options for elevation of RLE throughout day to manage swelling, pt verbalized understanding  04/23/2022 Recumbent bike Seat 8 for 8 minutes full AROM Seated knee AAROM flexion and extension 10X 5 seconds each Quadriceps sets 3 sets of 10 for 5 seconds with heel prop Supine knee flexion 10X 5 seconds Verbal review of remaining HEP  Functional Activities for steps and sit to stand:  Leg Press 50# R leg only 2 sets of 10 slow eccentrics  Vaso R knee 10 minutes post-activity 34* Medium Pressure  04/21/2022 Therex:    HEP instruction/performance c cues for techniques, handout provided.  Trial set performed of  each for comprehension and symptom assessment.  See below for exercise list       PATIENT EDUCATION:  Education details: HEP, POC Person educated:  Patient Education method: Explanation, Demonstration, Verbal cues, and Handouts Education comprehension: verbalized understanding, returned demonstration, and verbal cues required       HOME EXERCISE PROGRAM: Access Code: [email or web address] URL: [email or web address] Date: 05/02/2022 Prepared by: Scot Jun  Exercises - Supine Heel Slide  - 2 x daily - 7 x weekly - 3 sets - 10 reps - 2 hold - Seated Long Arc Quad  - 2 x daily - 7 x weekly - 3 sets - 10 reps - 2 hold - Supine Knee Extension Mobilization with Weight  - 2-4 x daily - 7 x weekly - 1 sets - 1 reps - to tolerance up to 15 mins hold - Seated Knee Flexion Extension AAROM with Overpressure  - 2-3 x daily - 7 x weekly - 1 sets - 10 reps - 5 hold - Seated Passive Knee Extension with Weight  - 2-4 x daily - 7 x weekly - 1 sets - 1 reps - to tolerance hold - Sit to Stand  - 1-2 x daily - 7 x weekly - 1-2 sets - 10 reps - Seated Straight Leg Heel Taps  - 1-2 x daily - 7 x weekly - 1-2 sets - 10 reps   ASSESSMENT:   CLINICAL IMPRESSION:  Carmela arrives today doing OK, still having a lot of edema in her surgical LE, per note 8/25 MD feels it is regular post-op swelling. Continued working on ROM and introduced some manual interventions such as retrograde massage and STM to quad to help improve range. Progressed balance challenge a bit as well today too. Will benefit from ongoing skilled PT efforts.    OBJECTIVE IMPAIRMENTS Abnormal gait, decreased activity tolerance, decreased balance, decreased coordination, decreased endurance, decreased mobility, difficulty walking, decreased ROM, decreased strength, hypomobility, increased edema, increased fascial restrictions, impaired perceived functional ability, increased muscle spasms, impaired flexibility, improper body mechanics, and pain.    ACTIVITY LIMITATIONS carrying, lifting, bending, sitting, standing, squatting, sleeping, stairs, transfers, bed mobility, bathing, toileting,  dressing, reach over head, and locomotion level   PARTICIPATION LIMITATIONS: meal prep, cleaning, laundry, interpersonal relationship, driving, shopping, community activity, and occupation   PERSONAL FACTORS 3+ comorbidities: Bilateral hands and knees OA, type 2 DM, breast cancer, anxiety, 2010/11 Rt Achilles repair/revision, Rt elbow surgery, 2 previous Rt knee scopes, obesity  are also affecting patient's functional outcome.    REHAB POTENTIAL: Good   CLINICAL DECISION MAKING: Stable/uncomplicated   EVALUATION COMPLEXITY: Low     GOALS: Goals reviewed with patient? Yes   Short term PT Goals (target date for Short term goals are 3 weeks 05/12/2022) Patient will demonstrate independent use of home exercise program to maintain progress from in clinic treatments. Goal status: on going- 05/02/2022   Long term PT goals (target dates for all long term goals are 10 weeks  06/30/2022 )   1. Patient will demonstrate/report pain at worst less than or equal to 2/10 to facilitate minimal limitation in daily activity secondary to pain symptoms. Goal status: New   2. Patient will demonstrate independent use of home exercise program to facilitate ability to maintain/progress functional gains from skilled physical therapy services. Goal status: New   3. Patient will demonstrate FOTO outcome > or = 63 % to indicate reduced disability due to condition. Goal status: New   4.  Patient will demonstrate Rt LE  MMT 5/5 throughout to faciltiate usual transfers, stairs, squatting at [facility] for daily life.    Goal status: New   5. Patient will demonstrate Rt knee AROM 0-110 degrees to facilitate ability to perform transfers, sitting, ambulation, stair navigation s restriction due to mobility.   Goal status: New   6.  Patient will demonstrate independent ambulation community distances > 300 ft.   Goal status: New   7.  Patient will demonstrate/report ability to ascend/descend stairs c reciprocal gait  pattern s UE assist for community integration.  a.  Goal Status: New       PLAN:   PT FREQUENCY: 2x/week   PT DURATION: 10 weeks   PLANNED INTERVENTIONS: Therapeutic exercises, Therapeutic activity, Neuro Muscular re-education, Balance training, Gait training, Patient/Family education, Joint mobilization, Stair training, DME instructions, Dry Needling, Electrical stimulation, Cryotherapy, Moist heat, Taping, Ultrasound, Ionotophoresis 52m/ml Dexamethasone, and Manual therapy.  All included unless contraindicated     PLAN FOR NEXT SESSION: Continue to progress quad strengthening and active range, static balance. Review HEP updates and progress as indicated. Vaso to end   KFord Motor CompanyPT DPT PN2  05/07/2022, 4:04 PM

## 2022-05-08 ENCOUNTER — Encounter (HOSPITAL_COMMUNITY): Payer: Self-pay

## 2022-05-08 ENCOUNTER — Emergency Department (HOSPITAL_COMMUNITY): Payer: Commercial Managed Care - HMO

## 2022-05-08 ENCOUNTER — Inpatient Hospital Stay (HOSPITAL_COMMUNITY)
Admission: EM | Admit: 2022-05-08 | Discharge: 2022-05-12 | DRG: 314 | Disposition: A | Discharge status: Home / Self Care | Payer: Commercial Managed Care - HMO | Attending: Internal Medicine | Admitting: Internal Medicine

## 2022-05-08 ENCOUNTER — Other Ambulatory Visit: Payer: Self-pay

## 2022-05-08 DIAGNOSIS — I2609 Other pulmonary embolism with acute cor pulmonale: Principal | ICD-10-CM

## 2022-05-08 DIAGNOSIS — Z9013 Acquired absence of bilateral breasts and nipples: Secondary | ICD-10-CM

## 2022-05-08 DIAGNOSIS — Z7984 Long term (current) use of oral hypoglycemic drugs: Secondary | ICD-10-CM

## 2022-05-08 DIAGNOSIS — I2699 Other pulmonary embolism without acute cor pulmonale: Secondary | ICD-10-CM | POA: Diagnosis not present

## 2022-05-08 DIAGNOSIS — R079 Chest pain, unspecified: Secondary | ICD-10-CM | POA: Diagnosis present

## 2022-05-08 DIAGNOSIS — Z86711 Personal history of pulmonary embolism: Secondary | ICD-10-CM | POA: Diagnosis present

## 2022-05-08 DIAGNOSIS — I82431 Acute embolism and thrombosis of right popliteal vein: Secondary | ICD-10-CM | POA: Diagnosis present

## 2022-05-08 DIAGNOSIS — Z801 Family history of malignant neoplasm of trachea, bronchus and lung: Secondary | ICD-10-CM

## 2022-05-08 DIAGNOSIS — Z853 Personal history of malignant neoplasm of breast: Secondary | ICD-10-CM

## 2022-05-08 DIAGNOSIS — I9789 Other postprocedural complications and disorders of the circulatory system, not elsewhere classified: Principal | ICD-10-CM | POA: Diagnosis present

## 2022-05-08 DIAGNOSIS — I82409 Acute embolism and thrombosis of unspecified deep veins of unspecified lower extremity: Secondary | ICD-10-CM | POA: Diagnosis present

## 2022-05-08 DIAGNOSIS — Z8371 Family history of colonic polyps: Secondary | ICD-10-CM

## 2022-05-08 DIAGNOSIS — Z818 Family history of other mental and behavioral disorders: Secondary | ICD-10-CM

## 2022-05-08 DIAGNOSIS — Z8042 Family history of malignant neoplasm of prostate: Secondary | ICD-10-CM

## 2022-05-08 DIAGNOSIS — R778 Other specified abnormalities of plasma proteins: Secondary | ICD-10-CM | POA: Diagnosis present

## 2022-05-08 DIAGNOSIS — Z87891 Personal history of nicotine dependence: Secondary | ICD-10-CM

## 2022-05-08 DIAGNOSIS — Z90710 Acquired absence of both cervix and uterus: Secondary | ICD-10-CM

## 2022-05-08 DIAGNOSIS — Z79899 Other long term (current) drug therapy: Secondary | ICD-10-CM

## 2022-05-08 DIAGNOSIS — Z833 Family history of diabetes mellitus: Secondary | ICD-10-CM

## 2022-05-08 DIAGNOSIS — Z8052 Family history of malignant neoplasm of bladder: Secondary | ICD-10-CM

## 2022-05-08 DIAGNOSIS — I82461 Acute embolism and thrombosis of right calf muscular vein: Secondary | ICD-10-CM | POA: Diagnosis present

## 2022-05-08 DIAGNOSIS — F32A Depression, unspecified: Secondary | ICD-10-CM | POA: Diagnosis present

## 2022-05-08 DIAGNOSIS — Y838 Other surgical procedures as the cause of abnormal reaction of the patient, or of later complication, without mention of misadventure at the time of the procedure: Secondary | ICD-10-CM | POA: Diagnosis present

## 2022-05-08 DIAGNOSIS — R7303 Prediabetes: Secondary | ICD-10-CM | POA: Diagnosis present

## 2022-05-08 DIAGNOSIS — Z7982 Long term (current) use of aspirin: Secondary | ICD-10-CM

## 2022-05-08 DIAGNOSIS — Z96651 Presence of right artificial knee joint: Secondary | ICD-10-CM | POA: Diagnosis present

## 2022-05-08 DIAGNOSIS — I82811 Embolism and thrombosis of superficial veins of right lower extremities: Secondary | ICD-10-CM | POA: Diagnosis present

## 2022-05-08 DIAGNOSIS — I82441 Acute embolism and thrombosis of right tibial vein: Secondary | ICD-10-CM | POA: Diagnosis present

## 2022-05-08 DIAGNOSIS — E119 Type 2 diabetes mellitus without complications: Secondary | ICD-10-CM | POA: Diagnosis present

## 2022-05-08 DIAGNOSIS — Z803 Family history of malignant neoplasm of breast: Secondary | ICD-10-CM

## 2022-05-08 DIAGNOSIS — R7989 Other specified abnormal findings of blood chemistry: Secondary | ICD-10-CM | POA: Diagnosis present

## 2022-05-08 DIAGNOSIS — Z8249 Family history of ischemic heart disease and other diseases of the circulatory system: Secondary | ICD-10-CM

## 2022-05-08 DIAGNOSIS — F411 Generalized anxiety disorder: Secondary | ICD-10-CM | POA: Diagnosis present

## 2022-05-08 DIAGNOSIS — Z800 Family history of malignant neoplasm of digestive organs: Secondary | ICD-10-CM

## 2022-05-08 NOTE — ED Provider Triage Note (Signed)
Emergency Medicine Provider Triage Evaluation Note  Christina [name] , a 56 y.o. female  was evaluated in triage.  Pt complains of chest pain. States pain began around 9:30p. Was driving home from dinner. States she began having left sided chest pain that she describes as heaviness or "someone sitting there." She's had associated palpitations. Denies shortness of breath. No vomiting or diaphoresis. She denies recent URI symptoms.  Review of Systems  Positive: See above Negative:   Physical Exam  LMP  (LMP Unknown)  HR: 96, BP  134/91, O2 sat 100% on room air, RR 17  Gen:   Awake, no distress   Resp:  Normal effort  MSK:   Moves extremities without difficulty  Other:  S1/S2, lungs CTAB  Medical Decision Making  Medically screening exam initiated at 11:42 PM.  Appropriate orders placed.  Mckenleigh Tarlton Righetti was informed that the remainder of the evaluation will be completed by another provider, this initial triage assessment does not replace that evaluation, and the importance of remaining in the ED until their evaluation is complete.     Mickie Hillier, PA-C 05/08/22 2344

## 2022-05-08 NOTE — ED Triage Notes (Signed)
Pt reports with central chest pain, that goes into her left shoulder along with [facility] since 2130.

## 2022-05-09 ENCOUNTER — Emergency Department (HOSPITAL_COMMUNITY): Payer: Commercial Managed Care - HMO

## 2022-05-09 ENCOUNTER — Inpatient Hospital Stay (HOSPITAL_COMMUNITY): Payer: Commercial Managed Care - HMO

## 2022-05-09 ENCOUNTER — Telehealth: Payer: Self-pay

## 2022-05-09 ENCOUNTER — Encounter: Payer: 59 | Admitting: Physical Therapy

## 2022-05-09 ENCOUNTER — Encounter (HOSPITAL_COMMUNITY): Payer: Self-pay

## 2022-05-09 DIAGNOSIS — E119 Type 2 diabetes mellitus without complications: Secondary | ICD-10-CM | POA: Diagnosis present

## 2022-05-09 DIAGNOSIS — Z7984 Long term (current) use of oral hypoglycemic drugs: Secondary | ICD-10-CM | POA: Diagnosis not present

## 2022-05-09 DIAGNOSIS — Z9013 Acquired absence of bilateral breasts and nipples: Secondary | ICD-10-CM | POA: Diagnosis not present

## 2022-05-09 DIAGNOSIS — I2699 Other pulmonary embolism without acute cor pulmonale: Secondary | ICD-10-CM

## 2022-05-09 DIAGNOSIS — R7989 Other specified abnormal findings of blood chemistry: Secondary | ICD-10-CM | POA: Diagnosis present

## 2022-05-09 DIAGNOSIS — Z853 Personal history of malignant neoplasm of breast: Secondary | ICD-10-CM | POA: Diagnosis not present

## 2022-05-09 DIAGNOSIS — I82461 Acute embolism and thrombosis of right calf muscular vein: Secondary | ICD-10-CM | POA: Diagnosis present

## 2022-05-09 DIAGNOSIS — I82811 Embolism and thrombosis of superficial veins of right lower extremities: Secondary | ICD-10-CM | POA: Diagnosis present

## 2022-05-09 DIAGNOSIS — Z7982 Long term (current) use of aspirin: Secondary | ICD-10-CM | POA: Diagnosis not present

## 2022-05-09 DIAGNOSIS — I2602 Saddle embolus of pulmonary artery with acute cor pulmonale: Secondary | ICD-10-CM

## 2022-05-09 DIAGNOSIS — R7303 Prediabetes: Secondary | ICD-10-CM | POA: Diagnosis not present

## 2022-05-09 DIAGNOSIS — Z801 Family history of malignant neoplasm of trachea, bronchus and lung: Secondary | ICD-10-CM | POA: Diagnosis not present

## 2022-05-09 DIAGNOSIS — I9789 Other postprocedural complications and disorders of the circulatory system, not elsewhere classified: Secondary | ICD-10-CM | POA: Diagnosis present

## 2022-05-09 DIAGNOSIS — Z96651 Presence of right artificial knee joint: Secondary | ICD-10-CM | POA: Diagnosis present

## 2022-05-09 DIAGNOSIS — Z800 Family history of malignant neoplasm of digestive organs: Secondary | ICD-10-CM | POA: Diagnosis not present

## 2022-05-09 DIAGNOSIS — I82441 Acute embolism and thrombosis of right tibial vein: Secondary | ICD-10-CM | POA: Diagnosis present

## 2022-05-09 DIAGNOSIS — Z803 Family history of malignant neoplasm of breast: Secondary | ICD-10-CM | POA: Diagnosis not present

## 2022-05-09 DIAGNOSIS — F411 Generalized anxiety disorder: Secondary | ICD-10-CM | POA: Diagnosis present

## 2022-05-09 DIAGNOSIS — M7989 Other specified soft tissue disorders: Secondary | ICD-10-CM

## 2022-05-09 DIAGNOSIS — Z8371 Family history of colonic polyps: Secondary | ICD-10-CM | POA: Diagnosis not present

## 2022-05-09 DIAGNOSIS — Z8249 Family history of ischemic heart disease and other diseases of the circulatory system: Secondary | ICD-10-CM | POA: Diagnosis not present

## 2022-05-09 DIAGNOSIS — F32A Depression, unspecified: Secondary | ICD-10-CM | POA: Diagnosis present

## 2022-05-09 DIAGNOSIS — I82431 Acute embolism and thrombosis of right popliteal vein: Secondary | ICD-10-CM | POA: Diagnosis present

## 2022-05-09 DIAGNOSIS — Z8052 Family history of malignant neoplasm of bladder: Secondary | ICD-10-CM | POA: Diagnosis not present

## 2022-05-09 DIAGNOSIS — R778 Other specified abnormalities of plasma proteins: Secondary | ICD-10-CM

## 2022-05-09 DIAGNOSIS — Y838 Other surgical procedures as the cause of abnormal reaction of the patient, or of later complication, without mention of misadventure at the time of the procedure: Secondary | ICD-10-CM | POA: Diagnosis present

## 2022-05-09 DIAGNOSIS — R079 Chest pain, unspecified: Secondary | ICD-10-CM | POA: Diagnosis not present

## 2022-05-09 DIAGNOSIS — Z86711 Personal history of pulmonary embolism: Secondary | ICD-10-CM | POA: Diagnosis present

## 2022-05-09 DIAGNOSIS — Z8042 Family history of malignant neoplasm of prostate: Secondary | ICD-10-CM | POA: Diagnosis not present

## 2022-05-09 DIAGNOSIS — Z818 Family history of other mental and behavioral disorders: Secondary | ICD-10-CM | POA: Diagnosis not present

## 2022-05-09 DIAGNOSIS — Z79899 Other long term (current) drug therapy: Secondary | ICD-10-CM | POA: Diagnosis not present

## 2022-05-09 DIAGNOSIS — I2609 Other pulmonary embolism with acute cor pulmonale: Secondary | ICD-10-CM | POA: Diagnosis not present

## 2022-05-09 LAB — CBC WITH DIFFERENTIAL/PLATELET
Abs Immature Granulocytes: 0.01 10*3/uL (ref 0.00–0.07)
Basophils Absolute: 0 10*3/uL (ref 0.0–0.1)
Basophils Relative: 0 %
Eosinophils Absolute: 0.2 10*3/uL (ref 0.0–0.5)
Eosinophils Relative: 3 %
HCT: 36.5 % (ref 36.0–46.0)
Hemoglobin: 11.2 g/dL — ABNORMAL LOW (ref 12.0–15.0)
Immature Granulocytes: 0 %
Lymphocytes Relative: 26 %
Lymphs Abs: 1.8 10*3/uL (ref 0.7–4.0)
MCH: 25.7 pg — ABNORMAL LOW (ref 26.0–34.0)
MCHC: 30.7 g/dL (ref 30.0–36.0)
MCV: 83.9 fL (ref 80.0–100.0)
Monocytes Absolute: 0.6 10*3/uL (ref 0.1–1.0)
Monocytes Relative: 9 %
Neutro Abs: 4.4 10*3/uL (ref 1.7–7.7)
Neutrophils Relative %: 62 %
Platelets: 293 10*3/uL (ref 150–400)
RBC: 4.35 MIL/uL (ref 3.87–5.11)
RDW: 14.6 % (ref 11.5–15.5)
WBC: 7 10*3/uL (ref 4.0–10.5)
nRBC: 0 % (ref 0.0–0.2)

## 2022-05-09 LAB — ECHOCARDIOGRAM COMPLETE
Area-P 1/2: 3.77 cm2
Calc EF: 77.6 %
Height: 67 in
S' Lateral: 2.5 cm
Single Plane A2C EF: 80.2 %
Single Plane A4C EF: 74.4 %
Weight: 3200 oz

## 2022-05-09 LAB — COMPREHENSIVE METABOLIC PANEL
ALT: 26 U/L (ref 0–44)
ALT: 22 U/L (ref 0–44)
AST: 19 U/L (ref 15–41)
AST: 15 U/L (ref 15–41)
Albumin: 3.4 g/dL — ABNORMAL LOW (ref 3.5–5.0)
Albumin: 3.3 g/dL — ABNORMAL LOW (ref 3.5–5.0)
Alkaline Phosphatase: 59 U/L (ref 38–126)
Alkaline Phosphatase: 51 U/L (ref 38–126)
Anion gap: 4 — ABNORMAL LOW (ref 5–15)
Anion gap: 5 (ref 5–15)
BUN: 14 mg/dL (ref 6–20)
BUN: 13 mg/dL (ref 6–20)
CO2: 24 mmol/L (ref 22–32)
CO2: 23 mmol/L (ref 22–32)
Calcium: 10 mg/dL (ref 8.9–10.3)
Calcium: 9.6 mg/dL (ref 8.9–10.3)
Chloride: 112 mmol/L — ABNORMAL HIGH (ref 98–111)
Chloride: 113 mmol/L — ABNORMAL HIGH (ref 98–111)
Creatinine, Ser: 0.97 mg/dL (ref 0.44–1.00)
Creatinine, Ser: 0.82 mg/dL (ref 0.44–1.00)
GFR, Estimated: >60 mL/min (ref >60)
GFR, Estimated: >60 mL/min (ref >60)
Glucose, Bld: 140 mg/dL — ABNORMAL HIGH (ref 70–99)
Glucose, Bld: 106 mg/dL — ABNORMAL HIGH (ref 70–99)
Potassium: 4 mmol/L (ref 3.5–5.1)
Potassium: 3.8 mmol/L (ref 3.5–5.1)
Sodium: 140 mmol/L (ref 135–145)
Sodium: 141 mmol/L (ref 135–145)
Total Bilirubin: 0.6 mg/dL (ref 0.3–1.2)
Total Bilirubin: 0.6 mg/dL (ref 0.3–1.2)
Total Protein: 7 g/dL (ref 6.5–8.1)
Total Protein: 6.7 g/dL (ref 6.5–8.1)

## 2022-05-09 LAB — CBC
HCT: 32.8 % — ABNORMAL LOW (ref 36.0–46.0)
Hemoglobin: 10.3 g/dL — ABNORMAL LOW (ref 12.0–15.0)
MCH: 26.3 pg (ref 26.0–34.0)
MCHC: 31.4 g/dL (ref 30.0–36.0)
MCV: 83.7 fL (ref 80.0–100.0)
Platelets: 284 10*3/uL (ref 150–400)
RBC: 3.92 MIL/uL (ref 3.87–5.11)
RDW: 14.8 % (ref 11.5–15.5)
WBC: 6.3 10*3/uL (ref 4.0–10.5)
nRBC: 0 % (ref 0.0–0.2)

## 2022-05-09 LAB — APTT: aPTT: 26 seconds (ref 24–36)

## 2022-05-09 LAB — HEPARIN LEVEL (UNFRACTIONATED)
Heparin Unfractionated: 0.17 IU/mL — ABNORMAL LOW (ref 0.30–0.70)
Heparin Unfractionated: 0.35 IU/mL (ref 0.30–0.70)

## 2022-05-09 LAB — PROTIME-INR
INR: 1.2 (ref 0.8–1.2)
Prothrombin Time: 14.8 seconds (ref 11.4–15.2)

## 2022-05-09 LAB — TROPONIN I (HIGH SENSITIVITY)
Troponin I (High Sensitivity): 55 ng/L — ABNORMAL HIGH (ref <18)
Troponin I (High Sensitivity): 65 ng/L — ABNORMAL HIGH (ref <18)

## 2022-05-09 LAB — BRAIN NATRIURETIC PEPTIDE: B Natriuretic Peptide: 26.4 pg/mL (ref 0.0–100.0)

## 2022-05-09 LAB — D-DIMER, QUANTITATIVE: D-Dimer, Quant: 3.85 ug/mL-FEU — ABNORMAL HIGH (ref 0.00–0.50)

## 2022-05-09 MED ORDER — TAMOXIFEN CITRATE 10 MG PO TABS
20.0000 mg | ORAL_TABLET | Freq: Every day | ORAL | Status: DC
  Administered 2022-05-09 – 2022-05-12 (×4): 20 mg via ORAL
  Filled 2022-05-09 (×4): qty 2

## 2022-05-09 MED ORDER — ACETAMINOPHEN 325 MG PO TABS: 650.0000 mg | ORAL_TABLET | Freq: Four times a day (QID) | ORAL | Status: DC | PRN

## 2022-05-09 MED ORDER — HEPARIN BOLUS VIA INFUSION
2400.0000 [IU] | Freq: Once | INTRAVENOUS | Status: AC
  Administered 2022-05-09: 2400 [IU] via INTRAVENOUS
  Filled 2022-05-09: qty 2400

## 2022-05-09 MED ORDER — AMITRIPTYLINE HCL 25 MG PO TABS
150.0000 mg | ORAL_TABLET | Freq: Every day | ORAL | Status: DC
  Administered 2022-05-09 – 2022-05-11 (×3): 150 mg via ORAL
  Filled 2022-05-09 (×3): qty 6

## 2022-05-09 MED ORDER — IOHEXOL 350 MG/ML SOLN
100.0000 mL | Freq: Once | INTRAVENOUS | Status: AC | PRN
  Administered 2022-05-09: 100 mL via INTRAVENOUS

## 2022-05-09 MED ORDER — BUPROPION HCL ER (XL) 300 MG PO TB24
300.0000 mg | ORAL_TABLET | Freq: Every morning | ORAL | Status: DC
  Administered 2022-05-09 – 2022-05-12 (×4): 300 mg via ORAL
  Filled 2022-05-09: qty 2
  Filled 2022-05-09 (×3): qty 1

## 2022-05-09 MED ORDER — HEPARIN (PORCINE) 25000 UT/250ML-% IV SOLN
1800.0000 [IU]/h | INTRAVENOUS | Status: DC
  Administered 2022-05-09: 1350 [IU]/h via INTRAVENOUS
  Administered 2022-05-09 – 2022-05-11 (×3): 1650 [IU]/h via INTRAVENOUS
  Administered 2022-05-11 – 2022-05-12 (×2): 1800 [IU]/h via INTRAVENOUS
  Filled 2022-05-09 (×6): qty 250

## 2022-05-09 MED ORDER — HEPARIN BOLUS VIA INFUSION
3000.0000 [IU] | Freq: Once | INTRAVENOUS | Status: AC
  Administered 2022-05-09: 3000 [IU] via INTRAVENOUS
  Filled 2022-05-09: qty 3000

## 2022-05-09 MED ORDER — ACETAMINOPHEN 650 MG RE SUPP: 650.0000 mg | Freq: Four times a day (QID) | RECTAL | Status: DC | PRN

## 2022-05-09 MED ORDER — ONDANSETRON HCL 4 MG PO TABS: 4.0000 mg | ORAL_TABLET | Freq: Four times a day (QID) | ORAL | Status: DC | PRN

## 2022-05-09 MED ORDER — ONDANSETRON HCL 4 MG/2ML IJ SOLN: 4.0000 mg | Freq: Four times a day (QID) | INTRAMUSCULAR | Status: DC | PRN

## 2022-05-09 MED ORDER — CYCLOSPORINE 0.05 % OP EMUL
1.0000 [drp] | Freq: Two times a day (BID) | OPHTHALMIC | Status: DC
Start: 2022-05-09 — End: 2022-05-12
  Administered 2022-05-09 – 2022-05-12 (×7): 1 [drp] via OPHTHALMIC
  Filled 2022-05-09 (×7): qty 30

## 2022-05-09 MED ORDER — PERFLUTREN LIPID MICROSPHERE
1.0000 mL | INTRAVENOUS | Status: AC | PRN
  Administered 2022-05-09: 2 mL via INTRAVENOUS

## 2022-05-09 MED ORDER — METHOCARBAMOL 500 MG PO TABS
750.0000 mg | ORAL_TABLET | Freq: Two times a day (BID) | ORAL | Status: DC | PRN
Start: 2022-05-09 — End: 2022-05-12

## 2022-05-09 MED ORDER — BREXPIPRAZOLE 2 MG PO TABS
2.0000 mg | ORAL_TABLET | Freq: Every day | ORAL | Status: DC
  Administered 2022-05-09 – 2022-05-11 (×3): 2 mg via ORAL
  Filled 2022-05-09 (×3): qty 1

## 2022-05-09 MED ORDER — HYDROCODONE-ACETAMINOPHEN 5-325 MG PO TABS: 1.0000 | ORAL_TABLET | ORAL | Status: DC | PRN

## 2022-05-09 MED ORDER — SODIUM CHLORIDE (PF) 0.9 % IJ SOLN
INTRAMUSCULAR | Status: AC
  Filled 2022-05-09: qty 50

## 2022-05-09 NOTE — ED Provider Notes (Signed)
[facility] DEPT Provider Note   CSN: [id number] Arrival date & time: 05/08/22  2330     History  Chief Complaint  Patient presents with   Chest Pain   Shortness of Breath    Christina [name] is a 56 y.o. female.  Patient is a 56 year old female with history of type 2 diabetes, prior history of breast cancer.  Patient presenting today for evaluation of chest pain.  This started acutely this evening while she was sitting at home.  She describes a "tooth ache" in the center of her chest that is worse when she attempts to breathe.  She denies any fevers, chills, or cough.  She denies any nausea, diaphoresis, or radiation of her pain.  Patient underwent right total knee replacement the end of July.  Since then, she has been having increased swelling in her ankle, calf, and knee.  She denies history of prior DVT or PE.  The history is provided by the patient.       Home Medications Prior to Admission medications   Medication Sig Start Date End Date Taking? Authorizing Provider  amitriptyline (ELAVIL) 150 MG tablet Take 150 mg by mouth at bedtime. 02/20/22  Yes [provider]  aspirin EC 81 MG tablet Take 1 tablet (81 mg total) by mouth 2 (two) times daily. To be taken after surgery to prevent blood clots.  Swallow whole. 03/24/22 03/24/23 Yes Aundra Dubin, PA-C  buPROPion (WELLBUTRIN XL) 300 MG 24 hr tablet Take 300 mg by mouth every morning. 03/16/22  Yes [provider]  cycloSPORINE (RESTASIS) 0.05 % ophthalmic emulsion Place 1 drop into both eyes 2 (two) times daily.   Yes [provider]  docusate sodium (COLACE) 100 MG capsule Take 1 capsule (100 mg total) by mouth daily as needed. Patient not taking: Reported on 05/09/2022 03/24/22 03/24/23  Aundra Dubin, PA-C  methocarbamol (ROBAXIN-750) 750 MG tablet Take 1 tablet (750 mg total) by mouth 2 (two) times daily as needed. Patient taking differently: Take 750 mg by mouth 2  (two) times daily as needed for muscle spasms. 03/24/22  Yes Aundra Dubin, PA-C  ondansetron (ZOFRAN) 4 MG tablet Take 1 tablet (4 mg total) by mouth every 8 (eight) hours as needed for nausea or vomiting. Patient not taking: Reported on 05/09/2022 03/24/22   Aundra Dubin, PA-C  REXULTI 2 MG TABS tablet Take 2 mg by mouth at bedtime. 03/09/22  Yes [provider]  tamoxifen (NOLVADEX) 20 MG tablet Take 1 tablet (20 mg total) by mouth daily. 03/17/22  Yes Nicholas Lose, MD  Vitamin D, Ergocalciferol, (DRISDOL) 1.25 MG (50000 UNIT) CAPS capsule 1 po q wed and 1 po q sun Patient taking differently: Take 50,000 Units by mouth every 7 (seven) days. Every Sunday 03/18/22  Yes Opalski, Neoma Laming, DO  metFORMIN (GLUCOPHAGE) 500 MG tablet 1 po with lunch and dinner daily Patient not taking: Reported on 05/09/2022 03/18/22   Mellody Dance, DO  oxyCODONE-acetaminophen (PERCOCET) 5-325 MG tablet Take 1-2 tablets by mouth every 8 (eight) hours as needed. To be taken after surgery Patient not taking: Reported on 05/09/2022 04/07/22   Aundra Dubin, PA-C  topiramate (TOPAMAX) 50 MG tablet 1 po twice daily Patient not taking: Reported on 05/09/2022 03/18/22   Mellody Dance, DO      Allergies    Patient has no known allergies.    Review of Systems   Review of Systems  All other systems  reviewed and are negative.   Physical Exam Updated Vital Signs BP 131/84 (BP Location: Right Arm)   Pulse 94   Temp 99.8 F (37.7 C)   Resp 16   Ht '5\' 7"'$  (1.702 m)   Wt 90.7 kg   LMP  (LMP Unknown)   SpO2 99%   BMI 31.32 kg/m  Physical Exam Vitals and nursing note reviewed.  Constitutional:      General: She is not in acute distress.    Appearance: She is well-developed. She is not diaphoretic.  HENT:     Head: Normocephalic and atraumatic.  Cardiovascular:     Rate and Rhythm: Normal rate and regular rhythm.     Heart sounds: No murmur heard.    No friction rub. No gallop.  Pulmonary:     Effort:  Pulmonary effort is normal. No respiratory distress.     Breath sounds: Normal breath sounds. No wheezing.  Abdominal:     General: Bowel sounds are normal. There is no distension.     Palpations: Abdomen is soft.     Tenderness: There is no abdominal tenderness.  Musculoskeletal:        General: Normal range of motion.     Cervical back: Normal range of motion and neck supple.     Right lower leg: Tenderness present. Edema present.     Left lower leg: No tenderness. No edema.     Comments: There is 2-3+ pitting edema of the right lower extremity.  There is tenderness to palpation in the calf.  Skin:    General: Skin is warm and dry.  Neurological:     General: No focal deficit present.     Mental Status: She is alert and oriented to person, place, and time.     ED Results / Procedures / Treatments   Labs (all labs ordered are listed, but only abnormal results are displayed) Labs Reviewed  COMPREHENSIVE METABOLIC PANEL - Abnormal; Notable for the following components:      Result Value   Chloride 112 (*)    Glucose, Bld 140 (*)    Albumin 3.4 (*)    Anion gap 4 (*)    All other components within normal limits  CBC WITH DIFFERENTIAL/PLATELET - Abnormal; Notable for the following components:   Hemoglobin 11.2 (*)    MCH 25.7 (*)    All other components within normal limits  TROPONIN I (HIGH SENSITIVITY) - Abnormal; Notable for the following components:   Troponin I (High Sensitivity) 55 (*)    All other components within normal limits  TROPONIN I (HIGH SENSITIVITY)    EKG EKG Interpretation  Date/Time:  Thursday May 08 2022 23:49:08 EDT Ventricular Rate:  95 PR Interval:  209 QRS Duration: 126 QT Interval:  386 QTC Calculation: 486 R Axis:   -28 Text Interpretation: Sinus rhythm Prolonged PR interval Nonspecific intraventricular conduction delay Probable anteroseptal infarct, old No significant change from 10/28/2021 Confirmed by Veryl Speak [phone]) on 05/09/2022  12:14:38 AM  Radiology DG Chest 2 View  Result Date: 05/09/2022 CLINICAL DATA:  Mid chest pain. EXAM: CHEST - 2 VIEW COMPARISON:  Jan 27, 2016 FINDINGS: The heart size and mediastinal contours are within normal limits. Both lungs are clear. Radiopaque surgical clips are seen overlying the left axilla. Additional surgical clips are seen within the right upper quadrant. The visualized skeletal structures are unremarkable. IMPRESSION: No active cardiopulmonary disease. Electronically Signed   By: Virgina Norfolk M.D.   On: 05/09/2022 00:53  Procedures Procedures    Medications Ordered in ED Medications - No data to display  ED Course/ Medical Decision Making/ A&P  This patient presents to the ED for concern of chest pain and shortness of breath, this involves an extensive number of treatment options, and is a complaint that carries with it a high risk of complications and morbidity.  The differential diagnosis includes acute coronary syndrome, pulmonary embolism, CHF, COPD, pneumothorax   Co morbidities that complicate the patient evaluation  Recent knee surgery   Additional history obtained:  No additional history or external records needed   Lab Tests:  I Ordered, and personally interpreted labs.  The pertinent results include: Mildly elevated troponin of 55 and D-dimer of 3.85, but laboratory studies otherwise essentially unremarkable   Imaging Studies ordered:  I ordered imaging studies including CTA of the chest I independently visualized and interpreted imaging which showed moderate to large clot burden pulmonary embolism with evidence for right heart strain I agree with the radiologist interpretation   Cardiac Monitoring: / EKG:  The patient was maintained on a cardiac monitor.  I personally viewed and interpreted the cardiac monitored which showed an underlying rhythm of: Sinus   Consultations Obtained:  I requested consultation with the hospitalist,  and  discussed lab and imaging findings as well as pertinent plan - they recommend: Admit for heparinization and further observation   Problem List / ED Course / Critical interventions / Medication management  Patient is a 56 year old female with recent knee surgery presenting with sudden onset chest pain and shortness of breath.  She has been having swelling in the surgically repaired leg.  Her EKG is unchanged, but initial troponin was 55.  Given the clinical presentation, I had high suspicion for pulmonary embolism and ordered a CTA of the chest.  This showed bilateral pulmonary emboli with findings of the right ventricle suggesting underlying right heart strain.  I have spoken with the hospitalist who will admit. I ordered medication including heparin for anticoagulation Reevaluation of the patient after these medicines showed that the patient improved I have reviewed the patients home medicines and have made adjustments as needed   Social Determinants of Health:  None   Test / Admission - Considered:  Patient to be admitted to the hospitalist service for anticoagulation and further monitoring.  At the request of Dr. Hal Hope, I did speak with Dr. Tamala Julian from [facility].  He does not feel as though the patient requires intervention and that she can safely be admitted to the hospitalist service.  He will have the critical care team consult on the patient in the morning.  He also recommends echocardiogram in the morning.  Final Clinical Impression(s) / ED Diagnoses Final diagnoses:  None    Rx / DC Orders ED Discharge Orders     None         Veryl Speak, MD 05/09/22 [phone]

## 2022-05-09 NOTE — Progress Notes (Signed)
ANTICOAGULATION CONSULT NOTE  Pharmacy Consult for Heparin Indication: pulmonary embolus  No Known Allergies  Patient Measurements: Height: '5\' 7"'$  (170.2 cm) Weight: 90.7 kg (200 lb) IBW/kg (Calculated) : 61.6 Heparin Dosing Weight: 80 kg  Vital Signs: Temp: 97.9 F (36.6 C) (09/01 1249) Temp Source: Oral (09/01 1249) BP: 126/75 (09/01 1230) Pulse Rate: 85 (09/01 1230)  Labs: Recent Labs    05/08/22 2355 05/09/22 0145 05/09/22 0327 05/09/22 1100 05/09/22 1120 05/09/22 1140  HGB 11.2*  --   --   --   --  10.3*  HCT 36.5  --   --   --   --  32.8*  PLT 293  --   --   --   --  284  APTT  --   --  26  --   --   --   LABPROT  --   --  14.8  --   --   --   INR  --   --  1.2  --   --   --   HEPARINUNFRC  --   --   --  0.17*  --   --   CREATININE 0.97  --   --   --  0.82  --   TROPONINIHS 55* 65*  --   --   --   --      Estimated Creatinine Clearance: 90.6 mL/min (by C-G formula based on SCr of 0.82 mg/dL).   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,  multifocal IDC, DCIS, ER/PR+,  01-09-2016 s/p bilteral mastectomies w/ left sln bx;  no chemoradiation   Cataracts, bilateral    Depression    Diabetes mellitus without complication (HCC)    type 2   GAD (generalized anxiety disorder)    Gallbladder problem    History of ovarian cyst    IBS (irritable bowel syndrome)    Joint pain    PONV (postoperative nausea and vomiting)    does well with scop patch   Retinal detachment    Rheg OS   Right knee meniscal tear    Urgency of urination     Medications:  No oral anticoagulation PTA  Assessment: 56 yr female with chest pain and shortness of breath PMH significant for DM, breast cancer, s/p TKR July 2023 with reported increased swelling in ankle, calf and knee. CTAngio  + bilateral PE and suggests underlying right  heart heart strain [name] lower extremities + DVT right   Today, 05/09/22 -HL 0.17 - subtherapeutic on heparin infusion at 1350 units/hr -CBC stable -No bleeding or infusion issues noted  Goal of Therapy:  Heparin level 0.3-0.7 units/ml Monitor platelets by anticoagulation protocol: Yes   Plan:  -Heparin 2400 unit bolus -Increase heparin infusion to 1650 units/hr -Check HL approximately 6 hours after rate change -Daily CBC and heparin level -Monitor for signs & symptoms of bleeding  Tawnya Crook, PharmD, BCPS Clinical Pharmacist 05/09/2022 12:55 PM

## 2022-05-09 NOTE — Progress Notes (Signed)
Pharmacy Brief Note - Evening Anticoagulation Follow Up:  Pt is a 33 yoF on heparin drip for acute bilateral PE and RLE DVT. For full history, see note by Dorena Bodo, PharmD from earlier today.   Assessment: HL = 0.35 is therapeutic on heparin infusion of 1650 units/hr Confirmed with RN that heparin infusing at correct rate. No line issues. No signs of bleeding.   Goal: HL 0.3 - 0.7 units/mL  Plan: Continue heparin infusion at 1650 units/hr Check confirmatory HL in 6 hours CBC, HL daily while on heparin infusion Monitor for signs of bleeding  Lenis Noon, PharmD 05/09/22 3:10 PM

## 2022-05-09 NOTE — Progress Notes (Signed)
ANTICOAGULATION CONSULT NOTE - Initial Consult  Pharmacy Consult for Heparin Indication: pulmonary embolus  No Known Allergies  Patient Measurements: Height: '5\' 7"'$  (170.2 cm) Weight: 90.7 kg (200 lb) IBW/kg (Calculated) : 61.6 Heparin Dosing Weight: 80 kg  Vital Signs: Temp: 98.4 F (36.9 C) (09/01 0352) Temp Source: Oral (08/31 2337) BP: 141/74 (09/01 0352) Pulse Rate: 87 (09/01 0352)  Labs: Recent Labs    05/08/22 2355 05/09/22 0145  HGB 11.2*  --   HCT 36.5  --   PLT 293  --   CREATININE 0.97  --   TROPONINIHS 55* 65*    Estimated Creatinine Clearance: 76.6 mL/min (by C-G formula based on SCr of 0.97 mg/dL).   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,  multifocal IDC, DCIS, ER/PR+,  01-09-2016 s/p bilteral mastectomies w/ left sln bx;  no chemoradiation   Cataracts, bilateral    Depression    Diabetes mellitus without complication (HCC)    type 2   GAD (generalized anxiety disorder)    Gallbladder problem    History of ovarian cyst    IBS (irritable bowel syndrome)    Joint pain    PONV (postoperative nausea and vomiting)    does well with scop patch   Retinal detachment    Rheg OS   Right knee meniscal tear    Urgency of urination     Medications:  No oral anticoagulation PTA  Assessment: 56 yr female with chest pain and shortness of breath PMH significant for DM, breast cancer, s/p TKR July 2023 with reported increased swelling in ankle, calf and knee. CTAngio = + bilateral PE and suggests underlying right heart heart strain  Goal of Therapy:  Heparin level 0.3-0.7 units/ml Monitor platelets by anticoagulation protocol: Yes   Plan:  Obtain baseline aPTT and PT/INR Per [place] nomogram: Heparin 3000 unit IV bolus x 1 followed by heparin gtt @ 1350 units/hr Check heparin level 6 hr after  heparin gtt started Daily CBC and heparin level Monitor for signs & symptoms of bleeding  Shelley Cocke, Toribio Harbour, PharmD 05/09/2022,4:54 AM

## 2022-05-09 NOTE — Progress Notes (Signed)
  Echocardiogram 2D Echocardiogram has been performed.  Christina [name] 05/09/2022, 12:07 PM

## 2022-05-09 NOTE — Progress Notes (Signed)
Bilateral lower extremity venous duplex has been completed. Preliminary results can be found in CV Proc through chart review.  Results were given to Dr. Marylyn Ishihara.  05/09/22 10:55 AM Christina [name] RVT

## 2022-05-09 NOTE — Telephone Encounter (Signed)
Received call from answering service concerning patient's right leg.  Tried calling patient back, but no answer.

## 2022-05-09 NOTE — H&P (Addendum)
History and Physical    Patient: Christina [name] VZD:[id number] DOB: Sep 08, 1966 DOA: 05/08/2022 DOS: the patient was seen and examined on 05/09/2022 PCP: Maurice Small, MD  Patient coming from: Home  Chief Complaint:  Chief Complaint  Patient presents with   Chest Pain   Shortness of Breath   HPI: Christina [name] is a 56 y.o. female with medical history significant of breast cancer. Presenting with left chest pain and dyspnea. She reports that she was in her normal state of health until about 2100hrs last night. She felt sudden onset non-radiating left chest pressure and soreness. It was constant. She was not active at the time. She didn't try any medications to help. She became concerned and came to the ED for evaluation.   On further questioning, she reports that she has had an increased cough over the last several days. She has not had fever or sick contacts. She notes that her RLE has been swollen since her knee surgery in July. Although she has remained mobile; the right calf has become more painful over the last couple of weeks. She otherwise denies any aggravating or alleviating factors.    Review of Systems: As mentioned in the history of present illness. All other systems reviewed and are negative. 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,  multifocal IDC, DCIS, ER/PR+,  01-09-2016 s/p bilteral mastectomies w/ left sln bx;  no chemoradiation   Cataracts, bilateral    Depression    Diabetes mellitus without complication ([place])    type 2   GAD (generalized anxiety disorder)    Gallbladder problem    History of ovarian cyst    IBS (irritable bowel syndrome)    Joint pain    PONV (postoperative nausea and vomiting)    does well with scop patch   Retinal detachment    Rheg OS   Right knee meniscal tear    Urgency of urination    Past Surgical History:   Procedure Laterality Date   ABDOMINAL HYSTERECTOMY  05/1996   endometriosis   ACHILLES TENDON REPAIR Right 2010;  revision 2011   BLADDER SUSPENSION  2000   BREAST BIOPSY Left 10/2015   BREAST IMPLANT REMOVAL Bilateral 08/12/2017   Procedure: REMOVAL BILATERAL BREAST IMPLANTS;  Surgeon: Wallace Going, DO;  Location: [place];  Service: Plastics;  Laterality: Bilateral;   BREAST RECONSTRUCTION WITH PLACEMENT OF TISSUE EXPANDER AND FLEX HD (ACELLULAR HYDRATED DERMIS) Bilateral 01/09/2016   BREAST RECONSTRUCTION WITH PLACEMENT OF TISSUE EXPANDER AND FLEX HD (ACELLULAR HYDRATED DERMIS) Bilateral 01/09/2016   Procedure: BREAST RECONSTRUCTION WITH PLACEMENT OF TISSUE EXPANDER AND FLEX HD (ACELLULAR HYDRATED DERMIS);  Surgeon: Loel Lofty Dillingham, DO;  Location: [place];  Service: Plastics;  Laterality: Bilateral;   BREAST RECONSTRUCTION WITH PLACEMENT OF TISSUE EXPANDER AND FLEX HD (ACELLULAR HYDRATED DERMIS) Bilateral 05/29/2016   Procedure: PLACEMENT OF BILATERAL TISSUE EXPANDER AND FLEX HD (ACELLULAR HYDRATED DERMIS);  Surgeon: Wallace Going, DO;  Location: [place];  Service: Plastics;  Laterality: Bilateral;   BREAST REDUCTION SURGERY Bilateral 11/26/2016   Procedure: BILATERAL BREAST CAPSULE CONTRACTURE RELASE;  Surgeon: Wallace Going, DO;  Location: [place];  Service: Plastics;  Laterality: Bilateral;   CESAREAN SECTION  1987; 1989; [place] Right x3   last one  02-01-2019 @ [facility]   EYE SURGERY Left 10/01/2020   Pneumatic retinopexy for repair of rheg RD - Dr. Bernarda Caffey   EYE SURGERY Left 10/04/2020   PPV - Dr. Bernarda Caffey   FAT GRAFTING BILATERAL BREAST  08-09-2018  '@[facility]'    GAS INSERTION Left 10/04/2020   Procedure: INSERTION OF GAS;  Surgeon: Bernarda Caffey, MD;  Location: [place];  Service: Ophthalmology;  Laterality: Left;   GAS/FLUID EXCHANGE Left 10/04/2020   Procedure: GAS/FLUID EXCHANGE;  Surgeon: Bernarda Caffey, MD;  Location: [place];  Service: Ophthalmology;  Laterality: Left;   INCISION AND DRAINAGE OF WOUND Bilateral 02/11/2016   Procedure: IRRIGATION AND DEBRIDEMENT OF BILATERAL BREAST POCKET;  Surgeon: Wallace Going, DO;  Location: [place];  Service: Plastics;  Laterality: Bilateral;   KNEE ARTHROSCOPY WITH MEDIAL MENISECTOMY Right 12/20/2019   Procedure: KNEE ARTHROSCOPY WITH MEDIAL MENISECTOMY;  Surgeon: Marchia Bond, MD;  Location: [place];  Service: Orthopedics;  Laterality: Right;   KNEE ARTHROSCOPY WITH MEDIAL MENISECTOMY Right 10/30/2021   Procedure: RIGHT KNEE ARTHROSCOPY WITH PARTIAL MEDIAL MENISECTOMY SYNOVECTOMY;  Surgeon: Leandrew Koyanagi, MD;  Location: [place];  Service: Orthopedics;  Laterality: Right;   LAPAROSCOPIC APPENDECTOMY  04-07-2011   '@[facility]'    w/ Excision peritoneal lipoma and lysis adhesions   LAPAROSCOPIC CHOLECYSTECTOMY  ~ [place] Right 10/04/2020   Procedure: LASER RETINOPEXY WITH INDIRECT LASER OPTHALMOSCOPE, RIGHT EYE;  Surgeon: Bernarda Caffey, MD;  Location: [place];  Service: Ophthalmology;  Laterality: Right;   LIPOSUCTION WITH LIPOFILLING Bilateral 11/26/2016   Procedure: LIPOFILLING FOR SYMMETRY;  Surgeon: Wallace Going, DO;  Location: [place];  Service: Plastics;  Laterality: Bilateral;   LIPOSUCTION WITH LIPOFILLING Bilateral 01/21/2017   Procedure: BILATERAL BREAST  LIPOFILLING FOR ASYMMETRY;  Surgeon: Wallace Going, DO;  Location: [place];  Service: Plastics;  Laterality: Bilateral;   LIPOSUCTION WITH LIPOFILLING Bilateral 06/28/2020   Procedure: Lipofilling bilateral breasts for asymmetry;  Surgeon: Wallace Going, DO;  Location: [place];  Service: Plastics;  Laterality: Bilateral;  90 min   MASTECTOMY Bilateral 01/09/2016   NIPPLE SPARING MASTECTOMY/SENTINAL LYMPH NODE BIOPSY/RECONSTRUCTION/PLACEMENT OF TISSUE EXPANDER  Bilateral 01/09/2016   Procedure: BILATERAL NIPPLE SPARING MASTECTOMY WITH LEFT SENTINAL LYMPH NODE BIOPSY ;  Surgeon: Stark Klein, MD;  Location: [place];  Service: General;  Laterality: Bilateral;   PHOTOCOAGULATION WITH LASER Left 10/04/2020   Procedure: PHOTOCOAGULATION WITH LASER;  Surgeon: Bernarda Caffey, MD;  Location: [place];  Service: Ophthalmology;  Laterality: Left;   REMOVAL OF BILATERAL TISSUE EXPANDERS WITH PLACEMENT OF BILATERAL BREAST IMPLANTS Bilateral 08/20/2016   Procedure: REMOVAL OF BILATERAL TISSUE EXPANDERS WITH PLACEMENT OF BILATERAL SILICONE IMPLANTS;  Surgeon: Wallace Going, DO;  Location: [place];  Service: Plastics;  Laterality: Bilateral;   REMOVAL OF BILATERAL TISSUE EXPANDERS WITH PLACEMENT OF BILATERAL BREAST IMPLANTS Bilateral 11/05/2017   Procedure: REMOVAL OF BILATERAL TISSUE EXPANDERS WITH PLACEMENT OF BILATERAL BREAST SILICONE IMPLANTS;  Surgeon: Wallace Going, DO;  Location: [place];  Service: Plastics;  Laterality: Bilateral;   REMOVAL OF TISSUE EXPANDER Bilateral 02/11/2016   Procedure: REMOVAL OF BILATERAL TISSUE EXPANDERS AND FLEX HD REMOVAL;  Surgeon: Wallace Going, DO;  Location: [place];  Service: Plastics;  Laterality: Bilateral;   RETINAL DETACHMENT SURGERY Left 10/01/2020   Pneumatic retinopexy for repair of rheg RD - Dr. Bernarda Caffey   RETINAL DETACHMENT  SURGERY Left 10/04/2020   PPV - Dr. Bernarda Caffey   SCLERAL BUCKLE Left 10/04/2020   Procedure: SCLERAL BUCKLE LEFT EYE;  Surgeon: Bernarda Caffey, MD;  Location: [place];  Service: Ophthalmology;  Laterality: Left;   TISSUE EXPANDER PLACEMENT Bilateral 08/12/2017   Procedure: PLACEMENT OF BILATERAL TISSUE EXPANDER;  Surgeon: Wallace Going, DO;  Location: [place];  Service: Plastics;  Laterality: Bilateral;   TOTAL KNEE ARTHROPLASTY Right 03/31/2022   Procedure: RIGHT TOTAL KNEE REPLACEMENT;  Surgeon: Leandrew Koyanagi, MD;   Location: [place];  Service: Orthopedics;  Laterality: Right;   TUBAL LIGATION Bilateral 1991   VITRECTOMY 25 GAUGE WITH SCLERAL BUCKLE Left 10/04/2020   Procedure: 25 GAUGE PARS PLANA VITRECTOMY LEFT EYE ;  Surgeon: Bernarda Caffey, MD;  Location: [place];  Service: Ophthalmology;  Laterality: Left;   Social History:  reports that she quit smoking about 13 years ago. Her smoking use included cigarettes. She has a 10.00 pack-year smoking history. She has never used smokeless tobacco. She reports that she does not drink alcohol and does not use drugs.  No Known Allergies  Family History  Problem Relation Age of Onset   Heart failure Father    Prostate cancer Father 73   Retinal detachment Father    Colon polyps Mother        approx 2   Other Mother        hx HPV and hysterectomy due to precancerous cells   Depression Mother    Anxiety disorder Mother    Other Sister 66       hx of hysterectomy for unspecified reason; still has ovaries   Other Sister 40       paternal half-sister hx of hysterectomy for unspecified reason; still has ovaries   Bladder Cancer Maternal Uncle 79       not a smoker   Kidney failure Maternal Grandmother    Congestive Heart Failure Maternal Grandmother    Colon cancer Maternal Grandmother 70   Diabetes Maternal Grandmother    Lung cancer Maternal Grandfather 57       smoker   Breast cancer Paternal Grandmother        dx. early 24s; w/ hx of trauma to breast   Crohn's disease Daughter    Lung cancer Maternal Uncle 37       smoker    Prior to Admission medications   Medication Sig Start Date End Date Taking? Authorizing Provider  amitriptyline (ELAVIL) 150 MG tablet Take 150 mg by mouth at bedtime. 02/20/22  Yes [provider]  aspirin EC 81 MG tablet Take 1 tablet (81 mg total) by mouth 2 (two) times daily. To be taken after surgery to prevent blood clots.  Swallow whole. 03/24/22 03/24/23 Yes Aundra Dubin, PA-C  buPROPion (WELLBUTRIN XL) 300 MG  24 hr tablet Take 300 mg by mouth every morning. 03/16/22  Yes [provider]  cycloSPORINE (RESTASIS) 0.05 % ophthalmic emulsion Place 1 drop into both eyes 2 (two) times daily.   Yes [provider]  docusate sodium (COLACE) 100 MG capsule Take 1 capsule (100 mg total) by mouth daily as needed. Patient not taking: Reported on 05/09/2022 03/24/22 03/24/23  Aundra Dubin, PA-C  methocarbamol (ROBAXIN-750) 750 MG tablet Take 1 tablet (750 mg total) by mouth 2 (two) times daily as needed. Patient taking differently: Take 750 mg by mouth 2 (two) times daily as needed for muscle spasms. 03/24/22  Yes Aundra Dubin,  PA-C  ondansetron (ZOFRAN) 4 MG tablet Take 1 tablet (4 mg total) by mouth every 8 (eight) hours as needed for nausea or vomiting. Patient not taking: Reported on 05/09/2022 03/24/22   Aundra Dubin, PA-C  REXULTI 2 MG TABS tablet Take 2 mg by mouth at bedtime. 03/09/22  Yes [provider]  tamoxifen (NOLVADEX) 20 MG tablet Take 1 tablet (20 mg total) by mouth daily. 03/17/22  Yes Nicholas Lose, MD  Vitamin D, Ergocalciferol, (DRISDOL) 1.25 MG (50000 UNIT) CAPS capsule 1 po q wed and 1 po q sun Patient taking differently: Take 50,000 Units by mouth every 7 (seven) days. Every Sunday 03/18/22  Yes Opalski, Neoma Laming, DO  metFORMIN (GLUCOPHAGE) 500 MG tablet 1 po with lunch and dinner daily Patient not taking: Reported on 05/09/2022 03/18/22   Mellody Dance, DO  oxyCODONE-acetaminophen (PERCOCET) 5-325 MG tablet Take 1-2 tablets by mouth every 8 (eight) hours as needed. To be taken after surgery Patient not taking: Reported on 05/09/2022 04/07/22   Aundra Dubin, PA-C  topiramate (TOPAMAX) 50 MG tablet 1 po twice daily Patient not taking: Reported on 05/09/2022 03/18/22   Mellody Dance, DO    Physical Exam: Vitals:   05/09/22 0430 05/09/22 0700 05/09/22 0730 05/09/22 0835  BP: 136/75 (!) 143/79 (!) 141/89   Pulse: 87 90 88   Resp:   18   Temp:    98.4 F (36.9 C)   TempSrc:      SpO2: 93% 96% 98%   Weight:      Height:       General: 56 y.o. female resting in bed in NAD Eyes: PERRL, normal sclera ENMT: Nares patent w/o discharge, orophaynx clear, dentition normal, ears w/o discharge/lesions/ulcers Neck: Supple, trachea midline Cardiovascular: RRR, +S1, S2, no m/g/r, equal pulses throughout Respiratory: CTABL, no w/r/r, normal WOB GI: BS+, NDNT, no masses noted, no organomegaly noted MSK: No c/c; RLE 4+ edema/TTP Neuro: A&O x 3, no focal deficits Psyc: Appropriate interaction and affect, calm/cooperative  Data Reviewed:  Lab Results  Component Value Date   NA 140 05/08/2022   K 4.0 05/08/2022   CO2 24 05/08/2022   GLUCOSE 140 (H) 05/08/2022   BUN 14 05/08/2022   CREATININE 0.97 05/08/2022   CALCIUM 10.0 05/08/2022   EGFR 80 09/19/2021   GFRNONAA >60 05/08/2022   Lab Results  Component Value Date   WBC 7.0 05/08/2022   HGB 11.2 (L) 05/08/2022   HCT 36.5 05/08/2022   MCV 83.9 05/08/2022   PLT 293 05/08/2022   CTA Chest PE 1. Bilateral pulmonary emboli extending from in the right and left pulmonary arteries bilaterally with moderate-to-large thrombus burden. The right ventricle is distended suggesting underlying right heart strain. 2. Scattered airspace disease in the right upper lobe, possible pulmonary infarcts versus other infectious or inflammatory process. 3. Small hiatal hernia.  EKG: sinus, no st elevations  Assessment and Plan: B/L Pulmonary emboli RLE DVT Chest pain Elevated troponin     - admit to inpt, progressive     - continue heparin gtt     - check echo     - PCCM consulted ON, appreciate assistance     - trp is likely demand ischemia     - EKG as above     - doppler shows RLE dvt  Pre-diabetes     - continue home regimen  Hx of breast cancer     - continue home regimen; follow up with onco  Advance Care Planning:  Code Status: DNI  Consults: PCCM  Family Communication: None at  bedside  Severity of Illness: The appropriate patient status for this patient is INPATIENT. Inpatient status is judged to be reasonable and necessary in order to provide the required intensity of service to ensure the patient's safety. The patient's presenting symptoms, physical exam findings, and initial radiographic and laboratory data in the context of their chronic comorbidities is felt to place them at high risk for further clinical deterioration. Furthermore, it is not anticipated that the patient will be medically stable for discharge from the hospital within 2 midnights of admission.   * I certify that at the point of admission it is my clinical judgment that the patient will require inpatient hospital care spanning beyond 2 midnights from the point of admission due to high intensity of service, high risk for further deterioration and high frequency of surveillance required.*  Time spent in coordination of this H&P: 60 minutes.  Author: Jonnie Finner, DO 05/09/2022 8:35 AM  For on call review [email or web address].

## 2022-05-10 DIAGNOSIS — I82431 Acute embolism and thrombosis of right popliteal vein: Secondary | ICD-10-CM

## 2022-05-10 DIAGNOSIS — R7303 Prediabetes: Secondary | ICD-10-CM

## 2022-05-10 DIAGNOSIS — I2609 Other pulmonary embolism with acute cor pulmonale: Secondary | ICD-10-CM

## 2022-05-10 DIAGNOSIS — R778 Other specified abnormalities of plasma proteins: Secondary | ICD-10-CM

## 2022-05-10 DIAGNOSIS — R079 Chest pain, unspecified: Secondary | ICD-10-CM

## 2022-05-10 LAB — COMPREHENSIVE METABOLIC PANEL
ALT: 23 U/L (ref 0–44)
AST: 19 U/L (ref 15–41)
Albumin: 3.3 g/dL — ABNORMAL LOW (ref 3.5–5.0)
Alkaline Phosphatase: 49 U/L (ref 38–126)
Anion gap: 5 (ref 5–15)
BUN: 12 mg/dL (ref 6–20)
CO2: 22 mmol/L (ref 22–32)
Calcium: 9.9 mg/dL (ref 8.9–10.3)
Chloride: 114 mmol/L — ABNORMAL HIGH (ref 98–111)
Creatinine, Ser: 0.78 mg/dL (ref 0.44–1.00)
GFR, Estimated: >60 mL/min (ref >60)
Glucose, Bld: 105 mg/dL — ABNORMAL HIGH (ref 70–99)
Potassium: 4.1 mmol/L (ref 3.5–5.1)
Sodium: 141 mmol/L (ref 135–145)
Total Bilirubin: 0.5 mg/dL (ref 0.3–1.2)
Total Protein: 6.8 g/dL (ref 6.5–8.1)

## 2022-05-10 LAB — CBC
HCT: 37.3 % (ref 36.0–46.0)
Hemoglobin: 11.3 g/dL — ABNORMAL LOW (ref 12.0–15.0)
MCH: 26.3 pg (ref 26.0–34.0)
MCHC: 30.3 g/dL (ref 30.0–36.0)
MCV: 86.7 fL (ref 80.0–100.0)
Platelets: 277 10*3/uL (ref 150–400)
RBC: 4.3 MIL/uL (ref 3.87–5.11)
RDW: 15 % (ref 11.5–15.5)
WBC: 5.9 10*3/uL (ref 4.0–10.5)
nRBC: 0 % (ref 0.0–0.2)

## 2022-05-10 LAB — HIV ANTIBODY (ROUTINE TESTING W REFLEX): HIV Screen 4th Generation wRfx: NONREACTIVE

## 2022-05-10 LAB — HEPARIN LEVEL (UNFRACTIONATED): Heparin Unfractionated: 0.34 IU/mL (ref 0.30–0.70)

## 2022-05-10 NOTE — Plan of Care (Signed)
  Problem: Education: Goal: Knowledge of General Education information will improve Description Including pain rating scale, medication(s)/side effects and non-pharmacologic comfort measures Outcome: Progressing   Problem: Health Behavior/Discharge Planning: Goal: Ability to manage health-related needs will improve Outcome: Progressing   

## 2022-05-10 NOTE — Progress Notes (Signed)
ANTICOAGULATION CONSULT NOTE  Pharmacy Consult for Heparin Indication: pulmonary embolus  No Known Allergies  Patient Measurements: Height: '5\' 7"'$  (170.2 cm) Weight: 90.7 kg (200 lb) IBW/kg (Calculated) : 61.6 Heparin Dosing Weight: 80 kg  Vital Signs: Temp: 98.4 F (36.9 C) (09/01 2343) Temp Source: Oral (09/01 2343) BP: 129/70 (09/01 2343) Pulse Rate: 81 (09/01 2343)  Labs: Recent Labs    05/08/22 2355 05/09/22 0145 05/09/22 0327 05/09/22 1100 05/09/22 1120 05/09/22 1140 05/09/22 2011 05/10/22 0234  HGB 11.2*  --   --   --   --  10.3*  --  11.3*  HCT 36.5  --   --   --   --  32.8*  --  37.3  PLT 293  --   --   --   --  284  --  277  APTT  --   --  26  --   --   --   --   --   LABPROT  --   --  14.8  --   --   --   --   --   INR  --   --  1.2  --   --   --   --   --   HEPARINUNFRC  --   --   --  0.17*  --   --  0.35 0.34  CREATININE 0.97  --   --   --  0.82  --   --   --   TROPONINIHS 55* 65*  --   --   --   --   --   --      Estimated Creatinine Clearance: 90.6 mL/min (by C-G formula based on SCr of 0.82 mg/dL).   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,  multifocal IDC, DCIS, ER/PR+,  01-09-2016 s/p bilteral mastectomies w/ left sln bx;  no chemoradiation   Cataracts, bilateral    Depression    Diabetes mellitus without complication ([facility])    type 2   GAD (generalized anxiety disorder)    Gallbladder problem    History of ovarian cyst    IBS (irritable bowel syndrome)    Joint pain    PONV (postoperative nausea and vomiting)    does well with scop patch   Retinal detachment    Rheg OS   Right knee meniscal tear    Urgency of urination     Medications:  No oral anticoagulation PTA  Assessment: 56 yr female with chest pain and shortness of breath PMH significant for DM, breast cancer, s/p TKR July  2023 with reported increased swelling in ankle, calf and knee. CTAngio  + bilateral PE and suggests underlying right heart heart strain [name] lower extremities + DVT right   05/10/22 -HL 0.34 - therapeutic on heparin infusion at 1650 units/hr -CBC stable -No bleeding or infusion issues noted  Goal of Therapy:  Heparin level 0.3-0.7 units/ml Monitor platelets by anticoagulation protocol: Yes   Plan:  -Continue heparin infusion @ 1650 units/hr -Daily CBC and heparin level -Monitor for signs & symptoms of bleeding  Everette Rank, PharmD 05/10/2022 2:57 AM

## 2022-05-10 NOTE — Progress Notes (Signed)
PROGRESS NOTE    Christina [name]  ZSW:[id number] DOB: May 27, 1966 DOA: 05/08/2022 PCP: Maurice Small, MD    Brief Narrative:   Christina [name] is a 56 y.o. female with past medical history significant for breast cancer, osteoarthritis s/p right total knee replacement July 2023 by Dr. Erlinda Hong, depression who presented to [facility] ED on 8/31 with complaints of chest pain and shortness of breath.  Additionally, patient reports persistent right lower extremity swelling since her knee surgery in July which her right calf has now become more painful.  Chest pain with acute onset, nonradiating localized to her left chest that was constant.  This was not induced by any type of exertion and did not try any medications to see if there was relief.  Denies any recent fall/trauma, no sick contacts.  She became concerned and came to the ED for further evaluation.  In the ED, temperature 98.4 F, HR 88, RR 30, BP 143/84, SPO2 98% on room air.  WBC 7.0, hemoglobin 11.2, platelets 293.  Sodium 140, potassium 4.0, chloride 112, CO2 24, glucose 140, BUN 14, creatinine 0.97.  AST 19, ALT 26, total bilirubin 0.6.  High sensitive troponin 55>65.  D-dimer elevated 3.85.  INR 1.2.  Chest x-ray with no active cardiopulmonary disease process.  CT angiogram chest with bilateral pulmonary emboli extending from right and left pulmonary arteries with moderate to large thrombus burden, right ventricle distended suggesting underlying right heart strain.  Patient was started on a heparin drip.  TRH consulted for further evaluation management of acute PE.   Assessment & Plan:   Acute pulmonary embolism Right lower extremity DVT Patient presenting to ED with acute onset chest pain associate with shortness of breath and nonproductive cough.  Patient reports swelling in her right lower extremity since recent total knee replacement back in July with now pain to her right calf region.  On arrival to the ED patient was afebrile without leukocytosis.   D-dimer elevated 3.85.  CT angiogram chest with bilateral pulmonary emboli extending from right and left pulmonary arteries with moderate to large thrombus burden, right ventricle distended suggesting underlying right heart strain.  Vascular duplex ultrasound bilateral lower extremities positive for right lower extremity DVT to the popliteal, posterior tibial veins and right gastrocnemius vein.  Echocardiogram with LVEF 70-75%, no regional wall motion normalities, mild LVH, normal diastolic parameters, RV systolic function mildly reduced, RV size normal, trivial MR, IVC normal in size.  Suspect etiology likely provoked from recent total knee replacement. --Continue heparin drip for 48 hours before considering transition to [place]; pharmacy consulted for monitoring --Monitor on telemetry  History of breast cancer --Continue tamoxifen  Depression/anxiety --Amitriptyline 150 mg p.o. qHS --Brexpiprazole '2mg'$  PO qHS --Bupropion 300 mg p.o. qAM   DVT prophylaxis:   Heparin drip     Code Status: Partial Code Family Communication: Family present at bedside  Disposition Plan:  Level of care: Progressive Status is: Inpatient Remains inpatient appropriate because: Heparin drip for at least 48 hours given clot burden regarding pulmonary embolism, anticipate discharge home in 2-3 days once transition to [place]    Consultants:  None  Procedures:  TTE  Antimicrobials:  None   Subjective: Patient seen examined bedside, resting comfortably.  Lying in bed.  No specific complaints this morning.  Currently chest pain-free and shortness of breath much improved.  Discussed will remain on heparin drip for at least 48 hours given the amount of clot burden before transitioning to an oral anticoagulant.  No other  questions or concerns at this time.  Denies headache, no dizziness, no recurrent chest pain, no palpitations, no fever/chills/night sweats, no nausea/vomiting/diarrhea, no abdominal pain, no focal  weakness, no fatigue, no paresthesias.  No acute events overnight per nursing staff.  Objective: Vitals:   05/09/22 1900 05/09/22 1932 05/09/22 2343 05/10/22 0544  BP: 131/61 129/87 129/70 124/73  Pulse: 81 80 81 78  Resp: (!) '21 20 20 18  '$ Temp:  98.7 F (37.1 C) 98.4 F (36.9 C) 98.6 F (37 C)  TempSrc:  Oral Oral Oral  SpO2: 97% 97% 97% 96%  Weight:      Height:        Intake/Output Summary (Last 24 hours) at 05/10/2022 1216 Last data filed at 05/10/2022 0600 Gross per 24 hour  Intake 486.41 ml  Output --  Net 486.41 ml   Filed Weights   05/08/22 2352  Weight: 90.7 kg    Examination:  Physical Exam: GEN: NAD, alert and oriented x 3, wd/wn HEENT: NCAT, PERRL, EOMI, sclera clear, MMM PULM: CTAB w/o wheezes/crackles, normal respiratory effort, on room air CV: RRR w/o M/G/R GI: abd soft, NTND, NABS, no R/G/M MSK: 2+ pitting edema RLE, muscle strength globally intact 5/5 bilateral upper/lower extremities NEURO: CN II-XII intact, no focal deficits, sensation to light touch intact PSYCH: normal mood/affect Integumentary: dry/intact, no rashes or wounds    Data Reviewed: I have personally reviewed following labs and imaging studies  CBC: Recent Labs  Lab 05/08/22 2355 05/09/22 1140 05/10/22 0234  WBC 7.0 6.3 5.9  NEUTROABS 4.4  --   --   HGB 11.2* 10.3* 11.3*  HCT 36.5 32.8* 37.3  MCV 83.9 83.7 86.7  PLT 293 284 627   Basic Metabolic Panel: Recent Labs  Lab 05/08/22 2355 05/09/22 1120 05/10/22 0234  NA 140 141 141  K 4.0 3.8 4.1  CL 112* 113* 114*  CO2 '24 23 22  '$ GLUCOSE 140* 106* 105*  BUN '14 13 12  '$ CREATININE 0.97 0.82 0.78  CALCIUM 10.0 9.6 9.9   GFR: Estimated Creatinine Clearance: 92.9 mL/min (by C-G formula based on SCr of 0.78 mg/dL). Liver Function Tests: Recent Labs  Lab 05/08/22 2355 05/09/22 1120 05/10/22 0234  AST '19 15 19  '$ ALT '26 22 23  '$ ALKPHOS 59 51 49  BILITOT 0.6 0.6 0.5  PROT 7.0 6.7 6.8  ALBUMIN 3.4* 3.3* 3.3*   No  results for input(s): "LIPASE", "AMYLASE" in the last 168 hours. No results for input(s): "AMMONIA" in the last 168 hours. Coagulation Profile: Recent Labs  Lab 05/09/22 0327  INR 1.2   Cardiac Enzymes: No results for input(s): "CKTOTAL", "CKMB", "CKMBINDEX", "TROPONINI" in the last 168 hours. BNP (last 3 results) No results for input(s): "PROBNP" in the last 8760 hours. HbA1C: No results for input(s): "HGBA1C" in the last 72 hours. CBG: No results for input(s): "GLUCAP" in the last 168 hours. Lipid Profile: No results for input(s): "CHOL", "HDL", "LDLCALC", "TRIG", "CHOLHDL", "LDLDIRECT" in the last 72 hours. Thyroid Function Tests: No results for input(s): "TSH", "T4TOTAL", "FREET4", "T3FREE", "THYROIDAB" in the last 72 hours. Anemia Panel: No results for input(s): "VITAMINB12", "FOLATE", "FERRITIN", "TIBC", "IRON", "RETICCTPCT" in the last 72 hours. Sepsis Labs: No results for input(s): "PROCALCITON", "LATICACIDVEN" in the last 168 hours.  No results found for this or any previous visit (from the past 240 hour(s)).       Radiology Studies: ECHOCARDIOGRAM COMPLETE  Result Date: 05/09/2022    ECHOCARDIOGRAM REPORT   Patient Name:  Angela Cox Date of Exam: 05/09/2022 Medical Rec #:  [id number]       Height:       67.0 in Accession #:    [id number]      Weight:       200.0 lb Date of Birth:  11/18/1965       BSA:          2.022 m Patient Age:    43 years        BP:           141/89 mmHg Patient Gender: F               HR:           84 bpm. Exam Location:  Inpatient Procedure: 2D Echo, Cardiac Doppler, Color Doppler and Intracardiac            Opacification Agent Indications:    I26.02 Pulmonary embolus  History:        Patient has prior history of Echocardiogram examinations, most                 recent 03/31/2022. Signs/Symptoms:Chest Pain. Breast cancer.  Sonographer:    Roseanna Rainbow RDCS Referring Phys: [id number] Jonnie Finner  Sonographer Comments: Technically difficult study due to  poor echo windows and patient is obese. Image acquisition challenging due to patient body habitus, Image acquisition challenging due to mastectomy and Image acquisition challenging due to breast implants. Extremely difficult due to breast implants and reconstruction. IMPRESSIONS  1. Left ventricular ejection fraction, by estimation, is 70 to 75%. The left ventricle has hyperdynamic function. The left ventricle has no regional wall motion abnormalities. There is mild left ventricular hypertrophy. Left ventricular diastolic parameters were normal.  2. Right ventricular systolic function is mildly reduced. The right ventricular size is normal. Tricuspid regurgitation signal is inadequate for assessing PA pressure.  3. The mitral valve is normal in structure. Trivial mitral valve regurgitation. No evidence of mitral stenosis.  4. The aortic valve is grossly normal. Unable to determine aortic valve morphology due to image quality. Aortic valve regurgitation is not visualized. No aortic stenosis is present.  5. The inferior vena cava is normal in size with <50% respiratory variability, suggesting right atrial pressure of 8 mmHg. FINDINGS  Left Ventricle: Left ventricular ejection fraction, by estimation, is 70 to 75%. The left ventricle has hyperdynamic function. The left ventricle has no regional wall motion abnormalities. Definity contrast agent was given IV to delineate the left ventricular endocardial borders. The left ventricular internal cavity size was normal in size. There is mild left ventricular hypertrophy. Left ventricular diastolic parameters were normal. Right Ventricle: The right ventricular size is normal. Right vetricular wall thickness was not well visualized. Right ventricular systolic function is mildly reduced. Tricuspid regurgitation signal is inadequate for assessing PA pressure. Left Atrium: Left atrial size was normal in size. Right Atrium: Right atrial size was normal in size. Pericardium: There  is no evidence of pericardial effusion. Presence of epicardial fat layer. Mitral Valve: The mitral valve is normal in structure. Trivial mitral valve regurgitation. No evidence of mitral valve stenosis. Tricuspid Valve: The tricuspid valve is normal in structure. Tricuspid valve regurgitation is trivial. No evidence of tricuspid stenosis. Aortic Valve: The aortic valve is grossly normal. Aortic valve regurgitation is not visualized. No aortic stenosis is present. Pulmonic Valve: The pulmonic valve was normal in structure. Pulmonic valve regurgitation is trivial. No evidence of pulmonic stenosis. Aorta: The aortic root is  normal in size and structure. Venous: The inferior vena cava is normal in size with less than 50% respiratory variability, suggesting right atrial pressure of 8 mmHg. IAS/Shunts: No atrial level shunt detected by color flow Doppler.  LEFT VENTRICLE PLAX 2D LVIDd:         4.00 cm     Diastology LVIDs:         2.50 cm     LV e' medial:    11.00 cm/s LV PW:         1.00 cm     LV E/e' medial:  8.7 LV IVS:        1.20 cm     LV e' lateral:   12.70 cm/s LVOT diam:     2.40 cm     LV E/e' lateral: 7.6 LV SV:         84 LV SV Index:   42 LVOT Area:     4.52 cm  LV Volumes (MOD) LV vol d, MOD A2C: 72.6 ml LV vol d, MOD A4C: 89.6 ml LV vol s, MOD A2C: 14.4 ml LV vol s, MOD A4C: 22.9 ml LV SV MOD A2C:     58.2 ml LV SV MOD A4C:     89.6 ml LV SV MOD BP:      65.0 ml RIGHT VENTRICLE             IVC RV S prime:     14.00 cm/s  IVC diam: 1.70 cm LEFT ATRIUM             Index        RIGHT ATRIUM           Index LA diam:        3.10 cm 1.53 cm/m   RA Area:     11.00 cm LA Vol (A2C):   47.0 ml 23.24 ml/m  RA Volume:   25.10 ml  12.41 ml/m LA Vol (A4C):   40.4 ml 19.98 ml/m LA Biplane Vol: 44.6 ml 22.05 ml/m  AORTIC VALVE LVOT Vmax:   123.00 cm/s LVOT Vmean:  76.000 cm/s LVOT VTI:    0.186 m  AORTA Ao Root diam: 3.60 cm Ao Asc diam:  3.60 cm MITRAL VALVE MV Area (PHT): 3.77 cm     SHUNTS MV Decel Time: 201  msec     Systemic VTI:  0.19 m MV E velocity: 96.10 cm/s   Systemic Diam: 2.40 cm MV A velocity: 102.00 cm/s MV E/A ratio:  0.94 Cherlynn Kaiser MD Electronically signed by Cherlynn Kaiser MD Signature Date/Time: 05/09/2022/5:32:08 PM    Final    VAS [name] LOWER EXTREMITY VENOUS (DVT)  Result Date: 05/09/2022  Lower Venous DVT Study Patient Name:  ELYSSIA STRAUSS[facility]  Date of Exam:   05/09/2022 Medical Rec #: [id number]        Accession #:    [id number] Date of Birth: Oct 27, 1965        Patient Gender: F Patient Age:   33 years Exam Location:  [facility] Procedure:      VAS [name] LOWER EXTREMITY VENOUS (DVT) Referring Phys: Cherylann Ratel --------------------------------------------------------------------------------  Indications: Swelling, and pulmonary embolism.  Risk Factors: Confirmed PE. Anticoagulation: Heparin. Comparison Study: No prior studies. Performing Technologist: Oliver Hum RVT  Examination Guidelines: A complete evaluation includes B-mode imaging, spectral Doppler, color Doppler, and power Doppler as needed of all accessible portions of each vessel. Bilateral testing is considered an integral part of a complete examination. Limited  examinations for reoccurring indications may be performed as noted. The reflux portion of the exam is performed with the patient in reverse Trendelenburg.  +---------+---------------+---------+-----------+----------+-------------------+ RIGHT    CompressibilityPhasicitySpontaneityPropertiesThrombus Aging      +---------+---------------+---------+-----------+----------+-------------------+ CFV      Full           Yes      Yes                                      +---------+---------------+---------+-----------+----------+-------------------+ SFJ      Full                                                             +---------+---------------+---------+-----------+----------+-------------------+ FV Prox  Full                                                              +---------+---------------+---------+-----------+----------+-------------------+ FV Mid   Full                                                             +---------+---------------+---------+-----------+----------+-------------------+ FV DistalFull           No       No                                       +---------+---------------+---------+-----------+----------+-------------------+ PFV      Full                                                             +---------+---------------+---------+-----------+----------+-------------------+ POP      None           No       No                   Acute               +---------+---------------+---------+-----------+----------+-------------------+ PTV      None                                         Acute               +---------+---------------+---------+-----------+----------+-------------------+ PERO                                                  Not well  visualized +---------+---------------+---------+-----------+----------+-------------------+ Gastroc  None                                         Acute               +---------+---------------+---------+-----------+----------+-------------------+ SSV      None                                         Acute               +---------+---------------+---------+-----------+----------+-------------------+   +---------+---------------+---------+-----------+----------+--------------+ LEFT     CompressibilityPhasicitySpontaneityPropertiesThrombus Aging +---------+---------------+---------+-----------+----------+--------------+ CFV      Full           Yes      Yes                                 +---------+---------------+---------+-----------+----------+--------------+ SFJ      Full                                                        +---------+---------------+---------+-----------+----------+--------------+ FV Prox  Full                                                         +---------+---------------+---------+-----------+----------+--------------+ FV Mid   Full                                                        +---------+---------------+---------+-----------+----------+--------------+ FV DistalFull                                                        +---------+---------------+---------+-----------+----------+--------------+ PFV      Full                                                        +---------+---------------+---------+-----------+----------+--------------+ POP      Full           Yes      Yes                                 +---------+---------------+---------+-----------+----------+--------------+ PTV      Full                                                        +---------+---------------+---------+-----------+----------+--------------+  PERO     Full                                                        +---------+---------------+---------+-----------+----------+--------------+     Summary: RIGHT: - Findings consistent with acute deep vein thrombosis involving the right popliteal vein, right posterior tibial veins, and right gastrocnemius veins. - Findings consistent with acute superficial vein thrombosis involving the right small saphenous vein. - No cystic structure found in the popliteal fossa.  LEFT: - There is no evidence of deep vein thrombosis in the lower extremity.  - No cystic structure found in the popliteal fossa.  *See table(s) above for measurements and observations. Electronically signed by Servando Snare MD on 05/09/2022 at 3:12:37 PM.    Final    CT Angio Chest PE W and/or Wo Contrast  Result Date: 05/09/2022 CLINICAL DATA:  Pulmonary embolism suspected, high probability. Central chest pain radiating to left shoulder. EXAM: CT ANGIOGRAPHY CHEST WITH CONTRAST TECHNIQUE: Multidetector CT imaging of the chest was performed using the standard protocol  during bolus administration of intravenous contrast. Multiplanar CT image reconstructions and MIPs were obtained to evaluate the vascular anatomy. RADIATION DOSE REDUCTION: This exam was performed according to the departmental dose-optimization program which includes automated exposure control, adjustment of the mA and/or kV according to patient size and/or use of iterative reconstruction technique. CONTRAST:  114m OMNIPAQUE IOHEXOL 350 MG/ML SOLN COMPARISON:  None Available. FINDINGS: Cardiovascular: The heart is normal in size and there is no pericardial effusion. The aorta is normal in caliber. The pulmonary trunk is distended suggesting underlying pulmonary artery hypertension. There are bilateral pulmonary emboli involving the right and left pulmonary arteries, as well as the lobar, segmental, and subsegmental arteries bilaterally with moderate-to-large thrombus burden. The right ventricle is distended suggesting right heart strain. Mediastinum/Nodes: No mediastinal, hilar, or axillary lymphadenopathy. The thyroid gland, trachea, and esophagus are within normal limits. There is a small hiatal hernia. Lungs/Pleura: Atelectasis is noted bilaterally. Scattered airspace opacities are noted in the right upper lobe. No effusion or pneumothorax. Upper Abdomen: No acute abnormality. Musculoskeletal: Bilateral breast implants are noted. Degenerative changes are present in the thoracic spine. No acute osseous abnormality. Review of the MIP images confirms the above findings. IMPRESSION: 1. Bilateral pulmonary emboli extending from in the right and left pulmonary arteries bilaterally with moderate-to-large thrombus burden. The right ventricle is distended suggesting underlying right heart strain. 2. Scattered airspace disease in the right upper lobe, possible pulmonary infarcts versus other infectious or inflammatory process. 3. Small hiatal hernia. Critical findings were reported to Dr. DVeryl Speakat 4:41 a.m.  Electronically Signed   By: LBrett FairyM.D.   On: 05/09/2022 04:43   DG Chest 2 View  Result Date: 05/09/2022 CLINICAL DATA:  Mid chest pain. EXAM: CHEST - 2 VIEW COMPARISON:  Jan 27, 2016 FINDINGS: The heart size and mediastinal contours are within normal limits. Both lungs are clear. Radiopaque surgical clips are seen overlying the left axilla. Additional surgical clips are seen within the right upper quadrant. The visualized skeletal structures are unremarkable. IMPRESSION: No active cardiopulmonary disease. Electronically Signed   By: TVirgina NorfolkM.D.   On: 05/09/2022 00:53        Scheduled Meds:  amitriptyline  150 mg Oral QHS   brexpiprazole  2 mg Oral QHS  buPROPion  300 mg Oral q morning   cycloSPORINE  1 drop Both Eyes BID   tamoxifen  20 mg Oral Daily   Continuous Infusions:  heparin 1,650 Units/hr (05/10/22 1008)     LOS: 1 day    Time spent: 53 minutes spent on chart review, discussion with nursing staff, consultants, updating family and interview/physical exam; more than 50% of that time was spent in counseling and/or coordination of care.    Deren Degrazia J [place] DO [facility] Available via Epic secure chat 7am-7pm After these hours, please refer to coverage provider listed on [email or web address] 05/10/2022, 12:16 PM

## 2022-05-11 DIAGNOSIS — I82409 Acute embolism and thrombosis of unspecified deep veins of unspecified lower extremity: Secondary | ICD-10-CM

## 2022-05-11 HISTORY — DX: Acute embolism and thrombosis of unspecified deep veins of unspecified lower extremity: I82.409

## 2022-05-11 LAB — CBC
HCT: 35.2 % — ABNORMAL LOW (ref 36.0–46.0)
Hemoglobin: 10.7 g/dL — ABNORMAL LOW (ref 12.0–15.0)
MCH: 25.7 pg — ABNORMAL LOW (ref 26.0–34.0)
MCHC: 30.4 g/dL (ref 30.0–36.0)
MCV: 84.6 fL (ref 80.0–100.0)
Platelets: 268 10*3/uL (ref 150–400)
RBC: 4.16 MIL/uL (ref 3.87–5.11)
RDW: 14.8 % (ref 11.5–15.5)
WBC: 5.4 10*3/uL (ref 4.0–10.5)
nRBC: 0 % (ref 0.0–0.2)

## 2022-05-11 LAB — HEPARIN LEVEL (UNFRACTIONATED)
Heparin Unfractionated: 0.24 IU/mL — ABNORMAL LOW (ref 0.30–0.70)
Heparin Unfractionated: 0.39 IU/mL (ref 0.30–0.70)
Heparin Unfractionated: 0.45 IU/mL (ref 0.30–0.70)

## 2022-05-11 MED ORDER — HEPARIN BOLUS VIA INFUSION
1200.0000 [IU] | Freq: Once | INTRAVENOUS | Status: AC
  Administered 2022-05-11: 1200 [IU] via INTRAVENOUS
  Filled 2022-05-11: qty 1200

## 2022-05-11 NOTE — Progress Notes (Signed)
Brief Pharmacy Consult Note - IV heparin follow up  Labs: heparin level 0.45  A/P: heparin level continues to be therapeutic now x 2 (goal 0.3-0.7) on current IV heparin rate of 1800 units/hr. No reported bleeding. Continue current IV heparin rate. Daily heparin level and CBC  Adrian Saran, PharmD, BCPS Secure Chat if ?s 05/11/2022 5:43 PM

## 2022-05-11 NOTE — Progress Notes (Signed)
PROGRESS NOTE    Christina [name]  GQQ:[id number] DOB: 1966-06-15 DOA: 05/08/2022 PCP: Maurice Small, MD    Brief Narrative:   Christina [name] is a 56 y.o. female with past medical history significant for breast cancer, osteoarthritis s/p right total knee replacement July 2023 by Dr. Erlinda Hong, depression who presented to [facility] ED on 8/31 with complaints of chest pain and shortness of breath.  Additionally, patient reports persistent right lower extremity swelling since her knee surgery in July which her right calf has now become more painful.  Chest pain with acute onset, nonradiating localized to her left chest that was constant.  This was not induced by any type of exertion and did not try any medications to see if there was relief.  Denies any recent fall/trauma, no sick contacts.  She became concerned and came to the ED for further evaluation.  In the ED, temperature 98.4 F, HR 88, RR 30, BP 143/84, SPO2 98% on room air.  WBC 7.0, hemoglobin 11.2, platelets 293.  Sodium 140, potassium 4.0, chloride 112, CO2 24, glucose 140, BUN 14, creatinine 0.97.  AST 19, ALT 26, total bilirubin 0.6.  High sensitive troponin 55>65.  D-dimer elevated 3.85.  INR 1.2.  Chest x-ray with no active cardiopulmonary disease process.  CT angiogram chest with bilateral pulmonary emboli extending from right and left pulmonary arteries with moderate to large thrombus burden, right ventricle distended suggesting underlying right heart strain.  Patient was started on a heparin drip.  TRH consulted for further evaluation management of acute PE.   Assessment & Plan:   Acute pulmonary embolism Right lower extremity DVT Patient presenting to ED with acute onset chest pain associate with shortness of breath and nonproductive cough.  Patient reports swelling in her right lower extremity since recent total knee replacement back in July with now pain to her right calf region.  On arrival to the ED patient was afebrile without leukocytosis.   D-dimer elevated 3.85.  CT angiogram chest with bilateral pulmonary emboli extending from right and left pulmonary arteries with moderate to large thrombus burden, right ventricle distended suggesting underlying right heart strain.  Vascular duplex ultrasound bilateral lower extremities positive for right lower extremity DVT to the popliteal, posterior tibial veins and right gastrocnemius vein.  Echocardiogram with LVEF 70-75%, no regional wall motion normalities, mild LVH, normal diastolic parameters, RV systolic function mildly reduced, RV size normal, trivial MR, IVC normal in size.  Suspect etiology likely provoked from recent total knee replacement. --Continue heparin drip; plan to transition to [place] tomorrow; pharmacy consulted for monitoring --Monitor on telemetry  History of breast cancer --Continue tamoxifen  Depression/anxiety --Amitriptyline 150 mg p.o. qHS --Brexpiprazole '2mg'$  PO qHS --Bupropion 300 mg p.o. qAM   DVT prophylaxis:   Heparin drip     Code Status: Partial Code Family Communication: No family present at bedside this am  Disposition Plan:  Level of care: Progressive Status is: Inpatient Remains inpatient appropriate because: Heparin drip for at least 48 hours given clot burden regarding pulmonary embolism, anticipate discharge home tomorrow after planned transition to [place]    Consultants:  None  Procedures:  TTE  Antimicrobials:  None   Subjective: Patient seen examined bedside, resting comfortably.  Lying in bed.  No specific complaints this morning.  Remains free of chest pain and no further shortness of breath.  Continues on heparin drip with plan to transition tomorrow given amount of clot burden on CT scan.  No other questions or concerns at  this time.  Denies headache, no dizziness, no recurrent chest pain, no palpitations, no fever/chills/night sweats, no nausea/vomiting/diarrhea, no abdominal pain, no focal weakness, no fatigue, no paresthesias.  No  acute events overnight per nursing staff.  Objective: Vitals:   05/10/22 1400 05/10/22 2231 05/11/22 0547 05/11/22 0951  BP: 115/67 131/77 118/73 120/69  Pulse: 82 77 77 78  Resp: 18 16 (!) 23 18  Temp: 98.2 F (36.8 C) 98.4 F (36.9 C) 99 F (37.2 C) 98.7 F (37.1 C)  TempSrc: Oral Oral Oral Oral  SpO2: 96% 95% 94% 99%  Weight:      Height:        Intake/Output Summary (Last 24 hours) at 05/11/2022 1058 Last data filed at 05/11/2022 0800 Gross per 24 hour  Intake 591.14 ml  Output --  Net 591.14 ml   Filed Weights   05/08/22 2352  Weight: 90.7 kg    Examination:  Physical Exam: GEN: NAD, alert and oriented x 3, wd/wn HEENT: NCAT, PERRL, EOMI, sclera clear, MMM PULM: CTAB w/o wheezes/crackles, normal respiratory effort, on room air CV: RRR w/o M/G/R GI: abd soft, NTND, NABS, no R/G/M MSK: 2+ pitting edema RLE, muscle strength globally intact 5/5 bilateral upper/lower extremities NEURO: CN II-XII intact, no focal deficits, sensation to light touch intact PSYCH: normal mood/affect Integumentary: dry/intact, no rashes or wounds    Data Reviewed: I have personally reviewed following labs and imaging studies  CBC: Recent Labs  Lab 05/08/22 2355 05/09/22 1140 05/10/22 0234 05/11/22 0401  WBC 7.0 6.3 5.9 5.4  NEUTROABS 4.4  --   --   --   HGB 11.2* 10.3* 11.3* 10.7*  HCT 36.5 32.8* 37.3 35.2*  MCV 83.9 83.7 86.7 84.6  PLT 293 284 277 419   Basic Metabolic Panel: Recent Labs  Lab 05/08/22 2355 05/09/22 1120 05/10/22 0234  NA 140 141 141  K 4.0 3.8 4.1  CL 112* 113* 114*  CO2 '24 23 22  '$ GLUCOSE 140* 106* 105*  BUN '14 13 12  '$ CREATININE 0.97 0.82 0.78  CALCIUM 10.0 9.6 9.9   GFR: Estimated Creatinine Clearance: 92.9 mL/min (by C-G formula based on SCr of 0.78 mg/dL). Liver Function Tests: Recent Labs  Lab 05/08/22 2355 05/09/22 1120 05/10/22 0234  AST '19 15 19  '$ ALT '26 22 23  '$ ALKPHOS 59 51 49  BILITOT 0.6 0.6 0.5  PROT 7.0 6.7 6.8  ALBUMIN 3.4*  3.3* 3.3*   No results for input(s): "LIPASE", "AMYLASE" in the last 168 hours. No results for input(s): "AMMONIA" in the last 168 hours. Coagulation Profile: Recent Labs  Lab 05/09/22 0327  INR 1.2   Cardiac Enzymes: No results for input(s): "CKTOTAL", "CKMB", "CKMBINDEX", "TROPONINI" in the last 168 hours. BNP (last 3 results) No results for input(s): "PROBNP" in the last 8760 hours. HbA1C: No results for input(s): "HGBA1C" in the last 72 hours. CBG: No results for input(s): "GLUCAP" in the last 168 hours. Lipid Profile: No results for input(s): "CHOL", "HDL", "LDLCALC", "TRIG", "CHOLHDL", "LDLDIRECT" in the last 72 hours. Thyroid Function Tests: No results for input(s): "TSH", "T4TOTAL", "FREET4", "T3FREE", "THYROIDAB" in the last 72 hours. Anemia Panel: No results for input(s): "VITAMINB12", "FOLATE", "FERRITIN", "TIBC", "IRON", "RETICCTPCT" in the last 72 hours. Sepsis Labs: No results for input(s): "PROCALCITON", "LATICACIDVEN" in the last 168 hours.  No results found for this or any previous visit (from the past 240 hour(s)).       Radiology Studies: ECHOCARDIOGRAM COMPLETE  Result Date: 05/09/2022  ECHOCARDIOGRAM REPORT   Patient Name:   Christina [name] [facility] Date of Exam: 05/09/2022 Medical Rec #:  [id number]       Height:       67.0 in Accession #:    [id number]      Weight:       200.0 lb Date of Birth:  04-Apr-1966       BSA:          2.022 m Patient Age:    5 years        BP:           141/89 mmHg Patient Gender: F               HR:           84 bpm. Exam Location:  Inpatient Procedure: 2D Echo, Cardiac Doppler, Color Doppler and Intracardiac            Opacification Agent Indications:    I26.02 Pulmonary embolus  History:        Patient has prior history of Echocardiogram examinations, most                 recent 03/31/2022. Signs/Symptoms:Chest Pain. Breast cancer.  Sonographer:    Roseanna Rainbow RDCS Referring Phys: [id number] Jonnie Finner  Sonographer Comments: Technically  difficult study due to poor echo windows and patient is obese. Image acquisition challenging due to patient body habitus, Image acquisition challenging due to mastectomy and Image acquisition challenging due to breast implants. Extremely difficult due to breast implants and reconstruction. IMPRESSIONS  1. Left ventricular ejection fraction, by estimation, is 70 to 75%. The left ventricle has hyperdynamic function. The left ventricle has no regional wall motion abnormalities. There is mild left ventricular hypertrophy. Left ventricular diastolic parameters were normal.  2. Right ventricular systolic function is mildly reduced. The right ventricular size is normal. Tricuspid regurgitation signal is inadequate for assessing PA pressure.  3. The mitral valve is normal in structure. Trivial mitral valve regurgitation. No evidence of mitral stenosis.  4. The aortic valve is grossly normal. Unable to determine aortic valve morphology due to image quality. Aortic valve regurgitation is not visualized. No aortic stenosis is present.  5. The inferior vena cava is normal in size with <50% respiratory variability, suggesting right atrial pressure of 8 mmHg. FINDINGS  Left Ventricle: Left ventricular ejection fraction, by estimation, is 70 to 75%. The left ventricle has hyperdynamic function. The left ventricle has no regional wall motion abnormalities. Definity contrast agent was given IV to delineate the left ventricular endocardial borders. The left ventricular internal cavity size was normal in size. There is mild left ventricular hypertrophy. Left ventricular diastolic parameters were normal. Right Ventricle: The right ventricular size is normal. Right vetricular wall thickness was not well visualized. Right ventricular systolic function is mildly reduced. Tricuspid regurgitation signal is inadequate for assessing PA pressure. Left Atrium: Left atrial size was normal in size. Right Atrium: Right atrial size was normal in  size. Pericardium: There is no evidence of pericardial effusion. Presence of epicardial fat layer. Mitral Valve: The mitral valve is normal in structure. Trivial mitral valve regurgitation. No evidence of mitral valve stenosis. Tricuspid Valve: The tricuspid valve is normal in structure. Tricuspid valve regurgitation is trivial. No evidence of tricuspid stenosis. Aortic Valve: The aortic valve is grossly normal. Aortic valve regurgitation is not visualized. No aortic stenosis is present. Pulmonic Valve: The pulmonic valve was normal in structure. Pulmonic valve regurgitation is trivial. No evidence  of pulmonic stenosis. Aorta: The aortic root is normal in size and structure. Venous: The inferior vena cava is normal in size with less than 50% respiratory variability, suggesting right atrial pressure of 8 mmHg. IAS/Shunts: No atrial level shunt detected by color flow Doppler.  LEFT VENTRICLE PLAX 2D LVIDd:         4.00 cm     Diastology LVIDs:         2.50 cm     LV e' medial:    11.00 cm/s LV PW:         1.00 cm     LV E/e' medial:  8.7 LV IVS:        1.20 cm     LV e' lateral:   12.70 cm/s LVOT diam:     2.40 cm     LV E/e' lateral: 7.6 LV SV:         84 LV SV Index:   42 LVOT Area:     4.52 cm  LV Volumes (MOD) LV vol d, MOD A2C: 72.6 ml LV vol d, MOD A4C: 89.6 ml LV vol s, MOD A2C: 14.4 ml LV vol s, MOD A4C: 22.9 ml LV SV MOD A2C:     58.2 ml LV SV MOD A4C:     89.6 ml LV SV MOD BP:      65.0 ml RIGHT VENTRICLE             IVC RV S prime:     14.00 cm/s  IVC diam: 1.70 cm LEFT ATRIUM             Index        RIGHT ATRIUM           Index LA diam:        3.10 cm 1.53 cm/m   RA Area:     11.00 cm LA Vol (A2C):   47.0 ml 23.24 ml/m  RA Volume:   25.10 ml  12.41 ml/m LA Vol (A4C):   40.4 ml 19.98 ml/m LA Biplane Vol: 44.6 ml 22.05 ml/m  AORTIC VALVE LVOT Vmax:   123.00 cm/s LVOT Vmean:  76.000 cm/s LVOT VTI:    0.186 m  AORTA Ao Root diam: 3.60 cm Ao Asc diam:  3.60 cm MITRAL VALVE MV Area (PHT): 3.77 cm      SHUNTS MV Decel Time: 201 msec     Systemic VTI:  0.19 m MV E velocity: 96.10 cm/s   Systemic Diam: 2.40 cm MV A velocity: 102.00 cm/s MV E/A ratio:  0.94 Cherlynn Kaiser MD Electronically signed by Cherlynn Kaiser MD Signature Date/Time: 05/09/2022/5:32:08 PM    Final         Scheduled Meds:  amitriptyline  150 mg Oral QHS   brexpiprazole  2 mg Oral QHS   buPROPion  300 mg Oral q morning   cycloSPORINE  1 drop Both Eyes BID   tamoxifen  20 mg Oral Daily   Continuous Infusions:  heparin 1,800 Units/hr (05/11/22 0452)     LOS: 2 days    Time spent: 48 minutes spent on chart review, discussion with nursing staff, consultants, updating family and interview/physical exam; more than 50% of that time was spent in counseling and/or coordination of care.    Wren Pryce J [place] DO [facility] Available via Epic secure chat 7am-7pm After these hours, please refer to coverage provider listed on [email or web address] 05/11/2022, 10:58 AM

## 2022-05-11 NOTE — Progress Notes (Signed)
ANTICOAGULATION CONSULT NOTE  Pharmacy Consult for Heparin Indication: pulmonary embolus  No Known Allergies  Patient Measurements: Height: '5\' 7"'$  (170.2 cm) Weight: 90.7 kg (200 lb) IBW/kg (Calculated) : 61.6 Heparin Dosing Weight: 80 kg  Vital Signs: Temp: 98.4 F (36.9 C) (09/02 2231) Temp Source: Oral (09/02 2231) BP: 131/77 (09/02 2231) Pulse Rate: 77 (09/02 2231)  Labs: Recent Labs    05/08/22 2355 05/09/22 0145 05/09/22 0327 05/09/22 1100 05/09/22 1120 05/09/22 1140 05/09/22 2011 05/10/22 0234 05/11/22 0401  HGB 11.2*  --   --   --   --  10.3*  --  11.3* 10.7*  HCT 36.5  --   --   --   --  32.8*  --  37.3 35.2*  PLT 293  --   --   --   --  284  --  277 268  APTT  --   --  26  --   --   --   --   --   --   LABPROT  --   --  14.8  --   --   --   --   --   --   INR  --   --  1.2  --   --   --   --   --   --   HEPARINUNFRC  --   --   --    < >  --   --  0.35 0.34 0.24*  CREATININE 0.97  --   --   --  0.82  --   --  0.78  --   TROPONINIHS 55* 65*  --   --   --   --   --   --   --    < > = values in this interval not displayed.     Estimated Creatinine Clearance: 92.9 mL/min (by C-G formula based on SCr of 0.78 mg/dL).   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,  multifocal IDC, DCIS, ER/PR+,  01-09-2016 s/p bilteral mastectomies w/ left sln bx;  no chemoradiation   Cataracts, bilateral    Depression    Diabetes mellitus without complication (HCC)    type 2   GAD (generalized anxiety disorder)    Gallbladder problem    History of ovarian cyst    IBS (irritable bowel syndrome)    Joint pain    PONV (postoperative nausea and vomiting)    does well with scop patch   Retinal detachment    Rheg OS   Right knee meniscal tear    Urgency of urination     Medications:  No oral anticoagulation  PTA  Assessment: 56 yr female with chest pain and shortness of breath PMH significant for DM, breast cancer, s/p TKR July 2023 with reported increased swelling in ankle, calf and knee. CTAngio  + bilateral PE and suggests underlying right heart heart strain [name] lower extremities + DVT right   05/11/22 -HL 0.24 - subtherapeutic on heparin infusion at 1650 units/hr - Hgb 10.7, Pltc wnl -RN reports that heparin infusion was interrupted during her shift (RN cannot recall time however heparin infusion has been running without issue since prior to new bag being hung @ 02:41).   Goal of Therapy:  Heparin level 0.3-0.7 units/ml Monitor platelets by anticoagulation  protocol: Yes   Plan:  - Heparin 1200 unit IV bolus x1 then increase heparin infusion to 1800 units/hr - Check heparin level 6 hr after rate increase -Daily CBC and heparin level -Monitor for signs & symptoms of bleeding  Everette Rank, PharmD 05/11/2022 4:45 AM

## 2022-05-11 NOTE — Progress Notes (Signed)
ANTICOAGULATION CONSULT NOTE  Pharmacy Consult for Heparin Indication: pulmonary embolus  No Known Allergies  Patient Measurements: Height: '5\' 7"'$  (170.2 cm) Weight: 90.7 kg (200 lb) IBW/kg (Calculated) : 61.6 Heparin Dosing Weight: 80 kg  Vital Signs: Temp: 98.7 F (37.1 C) (09/03 0951) Temp Source: Oral (09/03 0951) BP: 120/69 (09/03 0951) Pulse Rate: 78 (09/03 0951)  Labs: Recent Labs    05/08/22 2355 05/09/22 0145 05/09/22 0327 05/09/22 1100 05/09/22 1120 05/09/22 1140 05/09/22 2011 05/10/22 0234 05/11/22 0401 05/11/22 1016  HGB 11.2*  --   --   --   --  10.3*  --  11.3* 10.7*  --   HCT 36.5  --   --   --   --  32.8*  --  37.3 35.2*  --   PLT 293  --   --   --   --  284  --  277 268  --   APTT  --   --  26  --   --   --   --   --   --   --   LABPROT  --   --  14.8  --   --   --   --   --   --   --   INR  --   --  1.2  --   --   --   --   --   --   --   HEPARINUNFRC  --   --   --    < >  --   --    < > 0.34 0.24* 0.39  CREATININE 0.97  --   --   --  0.82  --   --  0.78  --   --   TROPONINIHS 55* 65*  --   --   --   --   --   --   --   --    < > = values in this interval not displayed.     Estimated Creatinine Clearance: 92.9 mL/min (by C-G formula based on SCr of 0.78 mg/dL).   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,  multifocal IDC, DCIS, ER/PR+,  01-09-2016 s/p bilteral mastectomies w/ left sln bx;  no chemoradiation   Cataracts, bilateral    Depression    Diabetes mellitus without complication (HCC)    type 2   GAD (generalized anxiety disorder)    Gallbladder problem    History of ovarian cyst    IBS (irritable bowel syndrome)    Joint pain    PONV (postoperative nausea and vomiting)    does well with scop patch   Retinal detachment    Rheg OS   Right knee meniscal tear    Urgency of urination      Medications:  No oral anticoagulation PTA  Assessment: 56 yr female with chest pain and shortness of breath. PMH significant for DM, breast cancer, s/p TKR July 2023 with reported increased swelling in ankle, calf and knee. CTAngio  + bilateral PE and suggests underlying right heart heart strain. [name] lower extremities + DVT right   05/11/22 Heparin level now therapeutic after rebolus and increase in heparin rate early this morning CBC ok, stable No reported bleeding  Goal of Therapy:  Heparin level 0.3-0.7 units/ml Monitor  platelets by anticoagulation protocol: Yes   Plan:  Continue current IV heparin rate of 1800 units/hr Will recheck heparin level in 6 hours as confirmatory level Daily CBC and heparin level Monitor for signs & symptoms of bleeding3 Noted plan per notes to likely change to [place] tomorrow 9/4   Adrian Saran, PharmD, BCPS Secure Chat if ?s 05/11/2022 12:21 PM

## 2022-05-12 LAB — CBC
HCT: 37.8 % (ref 36.0–46.0)
Hemoglobin: 11.4 g/dL — ABNORMAL LOW (ref 12.0–15.0)
MCH: 25.7 pg — ABNORMAL LOW (ref 26.0–34.0)
MCHC: 30.2 g/dL (ref 30.0–36.0)
MCV: 85.1 fL (ref 80.0–100.0)
Platelets: 279 10*3/uL (ref 150–400)
RBC: 4.44 MIL/uL (ref 3.87–5.11)
RDW: 15 % (ref 11.5–15.5)
WBC: 7 10*3/uL (ref 4.0–10.5)
nRBC: 0 % (ref 0.0–0.2)

## 2022-05-12 LAB — HEPARIN LEVEL (UNFRACTIONATED): Heparin Unfractionated: 0.38 IU/mL (ref 0.30–0.70)

## 2022-05-12 MED ORDER — RIVAROXABAN (XARELTO) VTE STARTER PACK (15 & 20 MG): ORAL_TABLET | ORAL | 0 refills | Status: DC

## 2022-05-12 MED ORDER — RIVAROXABAN 20 MG PO TABS: 20.0000 mg | ORAL_TABLET | Freq: Every day | ORAL | Status: DC

## 2022-05-12 MED ORDER — RIVAROXABAN 15 MG PO TABS
15.0000 mg | ORAL_TABLET | Freq: Two times a day (BID) | ORAL | Status: DC
  Administered 2022-05-12: 15 mg via ORAL
  Filled 2022-05-12: qty 1

## 2022-05-12 NOTE — Progress Notes (Signed)
[place] for Heparin - transition to Xarelto 9/4 Indication: pulmonary embolus + DVT  No Known Allergies  Patient Measurements: Height: '5\' 7"'$  (170.2 cm) Weight: 90.7 kg (200 lb) IBW/kg (Calculated) : 61.6 Heparin Dosing Weight: 80 kg  Vital Signs: Temp: 98.3 F (36.8 C) (09/04 0507) Temp Source: Oral (09/04 0507) BP: 118/74 (09/04 0507) Pulse Rate: 84 (09/04 0507)  Labs: Recent Labs    05/09/22 1120 05/09/22 1140 05/10/22 0234 05/11/22 0401 05/11/22 1016 05/11/22 1638 05/12/22 0327  HGB  --    < > 11.3* 10.7*  --   --  11.4*  HCT  --    < > 37.3 35.2*  --   --  37.8  PLT  --    < > 277 268  --   --  279  HEPARINUNFRC  --    < > 0.34 0.24* 0.39 0.45 0.38  CREATININE 0.82  --  0.78  --   --   --   --    < > = values in this interval not displayed.     Estimated Creatinine Clearance: 92.9 mL/min (by C-G formula based on SCr of 0.78 mg/dL).   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,  multifocal IDC, DCIS, ER/PR+,  01-09-2016 s/p bilteral mastectomies w/ left sln bx;  no chemoradiation   Cataracts, bilateral    Depression    Diabetes mellitus without complication (HCC)    type 2   GAD (generalized anxiety disorder)    Gallbladder problem    History of ovarian cyst    IBS (irritable bowel syndrome)    Joint pain    PONV (postoperative nausea and vomiting)    does well with scop patch   Retinal detachment    Rheg OS   Right knee meniscal tear    Urgency of urination     Medications:  No oral anticoagulation PTA  Assessment: 56 yr female with chest pain and shortness of breath. PMH significant for DM, breast cancer, s/p TKR July 2023 with reported increased swelling in ankle, calf and knee. CTAngio  + bilateral PE and suggests underlying right heart heart strain. [name] lower  extremities + DVT right   05/12/22 Heparin level therapeutic but to transition IV heparin to Xarelto this AM CBC ok, stable No reported bleeding  Goal of Therapy:  Heparin level 0.3-0.7 units/ml Monitor platelets by anticoagulation protocol: Yes   Plan:  Discontinue IV heparin now Start Xarelto '15mg'$  BID x 21 days then change to '20mg'$  once daily Daily CBC Monitor for signs & symptoms of bleeding3 Will counsel prior to discharge   Adrian Saran, PharmD, BCPS Secure Chat if ?s 05/12/2022 9:14 AM

## 2022-05-12 NOTE — Discharge Summary (Signed)
Physician Discharge Summary  Christina [name] ZOX:[id number] DOB: 08/18/1966 DOA: 05/08/2022  PCP: Maurice Small, MD  Admit date: 05/08/2022 Discharge date: 05/12/2022  Admitted From: Home Disposition: Home  Recommendations for Outpatient Follow-up:  Follow up with PCP in 1-2 weeks Started on Xarelto for acute DVT/PE Will need refills for [place]: No Equipment/Devices: None  Discharge Condition: Stable CODE STATUS: Full code Diet recommendation: Regular diet  History of present illness:  Christina [name] is a 56 y.o. female with past medical history significant for breast cancer, osteoarthritis s/p right total knee replacement July 2023 by Dr. Erlinda Hong, depression who presented to [facility] ED on 8/31 with complaints of chest pain and shortness of breath.  Additionally, patient reports persistent right lower extremity swelling since her knee surgery in July which her right calf has now become more painful.  Chest pain with acute onset, nonradiating localized to her left chest that was constant.  This was not induced by any type of exertion and did not try any medications to see if there was relief.  Denies any recent fall/trauma, no sick contacts.  She became concerned and came to the ED for further evaluation.   In the ED, temperature 98.4 F, HR 88, RR 30, BP 143/84, SPO2 98% on room air.  WBC 7.0, hemoglobin 11.2, platelets 293.  Sodium 140, potassium 4.0, chloride 112, CO2 24, glucose 140, BUN 14, creatinine 0.97.  AST 19, ALT 26, total bilirubin 0.6.  High sensitive troponin 55>65.  D-dimer elevated 3.85.  INR 1.2.  Chest x-ray with no active cardiopulmonary disease process.  CT angiogram chest with bilateral pulmonary emboli extending from right and left pulmonary arteries with moderate to large thrombus burden, right ventricle distended suggesting underlying right heart strain.  Patient was started on a heparin drip.  TRH consulted for further evaluation management of acute PE.  Hospital  course:  Acute pulmonary embolism Right lower extremity DVT Patient presenting to ED with acute onset chest pain associate with shortness of breath and nonproductive cough.  Patient reports swelling in her right lower extremity since recent total knee replacement back in July with now pain to her right calf region.  On arrival to the ED patient was afebrile without leukocytosis.  D-dimer elevated 3.85.  CT angiogram chest with bilateral pulmonary emboli extending from right and left pulmonary arteries with moderate to large thrombus burden, right ventricle distended suggesting underlying right heart strain.  Vascular duplex ultrasound bilateral lower extremities positive for right lower extremity DVT to the popliteal, posterior tibial veins and right gastrocnemius vein. Echocardiogram with LVEF 70-75%, no regional wall motion normalities, mild LVH, normal diastolic parameters, RV systolic function mildly reduced, RV size normal, trivial MR, IVC normal in size.  Suspect etiology likely provoked from recent total knee replacement.  Patient was started on a heparin drip and continued for greater than 48 hours given clot burden and transition to Xarelto.  Will need close outpatient follow-up with her PCP for further medication refills.   History of breast cancer Continue tamoxifen.  Outpatient follow-up with medical oncology.   Depression/anxiety Continue amitriptyline 150 mg p.o. qHS, Brexpiprazole '2mg'$  PO qHS, Bupropion 300 mg p.o. qAM  Discharge Diagnoses:  Principal Problem:   Acute pulmonary embolism ([place]) Active Problems:   Prediabetes   Chest pain   Elevated troponin   DVT (deep venous thrombosis) Christina [name])    Discharge Instructions  Discharge Instructions     Diet - low sodium heart healthy   Complete by:  As directed    Increase activity slowly   Complete by: As directed       Allergies as of 05/12/2022   No Known Allergies      Medication List     STOP taking these medications     aspirin EC 81 MG tablet   docusate sodium 100 MG capsule Commonly known as: Colace   metFORMIN 500 MG tablet Commonly known as: GLUCOPHAGE   ondansetron 4 MG tablet Commonly known as: Zofran   oxyCODONE-acetaminophen 5-325 MG tablet Commonly known as: Percocet   topiramate 50 MG tablet Commonly known as: Topamax       TAKE these medications    amitriptyline 150 MG tablet Commonly known as: ELAVIL Take 150 mg by mouth at bedtime.   buPROPion 300 MG 24 hr tablet Commonly known as: WELLBUTRIN XL Take 300 mg by mouth every morning.   cycloSPORINE 0.05 % ophthalmic emulsion Commonly known as: RESTASIS Place 1 drop into both eyes 2 (two) times daily.   methocarbamol 750 MG tablet Commonly known as: Robaxin-750 Take 1 tablet (750 mg total) by mouth 2 (two) times daily as needed. What changed: reasons to take this   Rexulti 2 MG Tabs tablet Generic drug: brexpiprazole Take 2 mg by mouth at bedtime.   Rivaroxaban Stater Pack (15 mg and 20 mg) Commonly known as: XARELTO STARTER PACK Follow package directions: Take one '15mg'$  tablet by mouth twice a day. On day 22, switch to one '20mg'$  tablet once a day. Take with food.   tamoxifen 20 MG tablet Commonly known as: NOLVADEX Take 1 tablet (20 mg total) by mouth daily.   Vitamin D (Ergocalciferol) 1.25 MG (50000 UNIT) Caps capsule Commonly known as: DRISDOL 1 po q wed and 1 po q sun What changed:  how much to take how to take this when to take this additional instructions        Follow-up Information     Maurice Small, MD. Schedule an appointment as soon as possible for a visit in 1 week(s).   Specialty: Family Medicine Contact information: [address] [place] [address] [phone]                No Known Allergies  Consultations: None   Procedures/Studies: ECHOCARDIOGRAM COMPLETE  Result Date: 05/09/2022    ECHOCARDIOGRAM REPORT   Patient Name:   Christina [name] [facility] Date  of Exam: 05/09/2022 Medical Rec #:  [id number]       Height:       67.0 in Accession #:    [id number]      Weight:       200.0 lb Date of Birth:  Feb 03, 1966       BSA:          2.022 m Patient Age:    26 years        BP:           141/89 mmHg Patient Gender: F               HR:           84 bpm. Exam Location:  Inpatient Procedure: 2D Echo, Cardiac Doppler, Color Doppler and Intracardiac            Opacification Agent Indications:    I26.02 Pulmonary embolus  History:        Patient has prior history of Echocardiogram examinations, most  recent 03/31/2022. Signs/Symptoms:Chest Pain. Breast cancer.  Sonographer:    Roseanna Rainbow RDCS Referring Phys: [id number] Jonnie Finner  Sonographer Comments: Technically difficult study due to poor echo windows and patient is obese. Image acquisition challenging due to patient body habitus, Image acquisition challenging due to mastectomy and Image acquisition challenging due to breast implants. Extremely difficult due to breast implants and reconstruction. IMPRESSIONS  1. Left ventricular ejection fraction, by estimation, is 70 to 75%. The left ventricle has hyperdynamic function. The left ventricle has no regional wall motion abnormalities. There is mild left ventricular hypertrophy. Left ventricular diastolic parameters were normal.  2. Right ventricular systolic function is mildly reduced. The right ventricular size is normal. Tricuspid regurgitation signal is inadequate for assessing PA pressure.  3. The mitral valve is normal in structure. Trivial mitral valve regurgitation. No evidence of mitral stenosis.  4. The aortic valve is grossly normal. Unable to determine aortic valve morphology due to image quality. Aortic valve regurgitation is not visualized. No aortic stenosis is present.  5. The inferior vena cava is normal in size with <50% respiratory variability, suggesting right atrial pressure of 8 mmHg. FINDINGS  Left Ventricle: Left ventricular ejection fraction, by  estimation, is 70 to 75%. The left ventricle has hyperdynamic function. The left ventricle has no regional wall motion abnormalities. Definity contrast agent was given IV to delineate the left ventricular endocardial borders. The left ventricular internal cavity size was normal in size. There is mild left ventricular hypertrophy. Left ventricular diastolic parameters were normal. Right Ventricle: The right ventricular size is normal. Right vetricular wall thickness was not well visualized. Right ventricular systolic function is mildly reduced. Tricuspid regurgitation signal is inadequate for assessing PA pressure. Left Atrium: Left atrial size was normal in size. Right Atrium: Right atrial size was normal in size. Pericardium: There is no evidence of pericardial effusion. Presence of epicardial fat layer. Mitral Valve: The mitral valve is normal in structure. Trivial mitral valve regurgitation. No evidence of mitral valve stenosis. Tricuspid Valve: The tricuspid valve is normal in structure. Tricuspid valve regurgitation is trivial. No evidence of tricuspid stenosis. Aortic Valve: The aortic valve is grossly normal. Aortic valve regurgitation is not visualized. No aortic stenosis is present. Pulmonic Valve: The pulmonic valve was normal in structure. Pulmonic valve regurgitation is trivial. No evidence of pulmonic stenosis. Aorta: The aortic root is normal in size and structure. Venous: The inferior vena cava is normal in size with less than 50% respiratory variability, suggesting right atrial pressure of 8 mmHg. IAS/Shunts: No atrial level shunt detected by color flow Doppler.  LEFT VENTRICLE PLAX 2D LVIDd:         4.00 cm     Diastology LVIDs:         2.50 cm     LV e' medial:    11.00 cm/s LV PW:         1.00 cm     LV E/e' medial:  8.7 LV IVS:        1.20 cm     LV e' lateral:   12.70 cm/s LVOT diam:     2.40 cm     LV E/e' lateral: 7.6 LV SV:         84 LV SV Index:   42 LVOT Area:     4.52 cm  LV Volumes  (MOD) LV vol d, MOD A2C: 72.6 ml LV vol d, MOD A4C: 89.6 ml LV vol s, MOD A2C: 14.4 ml LV vol s,  MOD A4C: 22.9 ml LV SV MOD A2C:     58.2 ml LV SV MOD A4C:     89.6 ml LV SV MOD BP:      65.0 ml RIGHT VENTRICLE             IVC RV S prime:     14.00 cm/s  IVC diam: 1.70 cm LEFT ATRIUM             Index        RIGHT ATRIUM           Index LA diam:        3.10 cm 1.53 cm/m   RA Area:     11.00 cm LA Vol (A2C):   47.0 ml 23.24 ml/m  RA Volume:   25.10 ml  12.41 ml/m LA Vol (A4C):   40.4 ml 19.98 ml/m LA Biplane Vol: 44.6 ml 22.05 ml/m  AORTIC VALVE LVOT Vmax:   123.00 cm/s LVOT Vmean:  76.000 cm/s LVOT VTI:    0.186 m  AORTA Ao Root diam: 3.60 cm Ao Asc diam:  3.60 cm MITRAL VALVE MV Area (PHT): 3.77 cm     SHUNTS MV Decel Time: 201 msec     Systemic VTI:  0.19 m MV E velocity: 96.10 cm/s   Systemic Diam: 2.40 cm MV A velocity: 102.00 cm/s MV E/A ratio:  0.94 Cherlynn Kaiser MD Electronically signed by Cherlynn Kaiser MD Signature Date/Time: 05/09/2022/5:32:08 PM    Final    VAS [name] LOWER EXTREMITY VENOUS (DVT)  Result Date: 05/09/2022  Lower Venous DVT Study Patient Name:  NAKSHATRA KLOSE [facility]  Date of Exam:   05/09/2022 Medical Rec #: [id number]        Accession #:    [id number] Date of Birth: May 18, 1966        Patient Gender: F Patient Age:   39 years Exam Location:  [facility] Procedure:      VAS [name] LOWER EXTREMITY VENOUS (DVT) Referring Phys: Cherylann Ratel --------------------------------------------------------------------------------  Indications: Swelling, and pulmonary embolism.  Risk Factors: Confirmed PE. Anticoagulation: Heparin. Comparison Study: No prior studies. Performing Technologist: Oliver Hum RVT  Examination Guidelines: A complete evaluation includes B-mode imaging, spectral Doppler, color Doppler, and power Doppler as needed of all accessible portions of each vessel. Bilateral testing is considered an integral part of a complete examination. Limited examinations for reoccurring  indications may be performed as noted. The reflux portion of the exam is performed with the patient in reverse Trendelenburg.  +---------+---------------+---------+-----------+----------+-------------------+ RIGHT    CompressibilityPhasicitySpontaneityPropertiesThrombus Aging      +---------+---------------+---------+-----------+----------+-------------------+ CFV      Full           Yes      Yes                                      +---------+---------------+---------+-----------+----------+-------------------+ SFJ      Full                                                             +---------+---------------+---------+-----------+----------+-------------------+ FV Prox  Full                                                             +---------+---------------+---------+-----------+----------+-------------------+  FV Mid   Full                                                             +---------+---------------+---------+-----------+----------+-------------------+ FV DistalFull           No       No                                       +---------+---------------+---------+-----------+----------+-------------------+ PFV      Full                                                             +---------+---------------+---------+-----------+----------+-------------------+ POP      None           No       No                   Acute               +---------+---------------+---------+-----------+----------+-------------------+ PTV      None                                         Acute               +---------+---------------+---------+-----------+----------+-------------------+ PERO                                                  Not well visualized +---------+---------------+---------+-----------+----------+-------------------+ Gastroc  None                                         Acute                +---------+---------------+---------+-----------+----------+-------------------+ SSV      None                                         Acute               +---------+---------------+---------+-----------+----------+-------------------+   +---------+---------------+---------+-----------+----------+--------------+ LEFT     CompressibilityPhasicitySpontaneityPropertiesThrombus Aging +---------+---------------+---------+-----------+----------+--------------+ CFV      Full           Yes      Yes                                 +---------+---------------+---------+-----------+----------+--------------+ SFJ      Full                                                        +---------+---------------+---------+-----------+----------+--------------+  FV Prox  Full                                                        +---------+---------------+---------+-----------+----------+--------------+ FV Mid   Full                                                        +---------+---------------+---------+-----------+----------+--------------+ FV DistalFull                                                        +---------+---------------+---------+-----------+----------+--------------+ PFV      Full                                                        +---------+---------------+---------+-----------+----------+--------------+ POP      Full           Yes      Yes                                 +---------+---------------+---------+-----------+----------+--------------+ PTV      Full                                                        +---------+---------------+---------+-----------+----------+--------------+ PERO     Full                                                        +---------+---------------+---------+-----------+----------+--------------+     Summary: RIGHT: - Findings consistent with acute deep vein thrombosis involving the right popliteal  vein, right posterior tibial veins, and right gastrocnemius veins. - Findings consistent with acute superficial vein thrombosis involving the right small saphenous vein. - No cystic structure found in the popliteal fossa.  LEFT: - There is no evidence of deep vein thrombosis in the lower extremity.  - No cystic structure found in the popliteal fossa.  *See table(s) above for measurements and observations. Electronically signed by Servando Snare MD on 05/09/2022 at 3:12:37 PM.    Final    CT Angio Chest PE W and/or Wo Contrast  Result Date: 05/09/2022 CLINICAL DATA:  Pulmonary embolism suspected, high probability. Central chest pain radiating to left shoulder. EXAM: CT ANGIOGRAPHY CHEST WITH CONTRAST TECHNIQUE: Multidetector CT imaging of the chest was performed using the standard protocol during bolus administration of intravenous contrast. Multiplanar CT image reconstructions and MIPs were obtained to evaluate the vascular anatomy. RADIATION DOSE REDUCTION: This exam was performed according to  the departmental dose-optimization program which includes automated exposure control, adjustment of the mA and/or kV according to patient size and/or use of iterative reconstruction technique. CONTRAST:  147m OMNIPAQUE IOHEXOL 350 MG/ML SOLN COMPARISON:  None Available. FINDINGS: Cardiovascular: The heart is normal in size and there is no pericardial effusion. The aorta is normal in caliber. The pulmonary trunk is distended suggesting underlying pulmonary artery hypertension. There are bilateral pulmonary emboli involving the right and left pulmonary arteries, as well as the lobar, segmental, and subsegmental arteries bilaterally with moderate-to-large thrombus burden. The right ventricle is distended suggesting right heart strain. Mediastinum/Nodes: No mediastinal, hilar, or axillary lymphadenopathy. The thyroid gland, trachea, and esophagus are within normal limits. There is a small hiatal hernia. Lungs/Pleura: Atelectasis  is noted bilaterally. Scattered airspace opacities are noted in the right upper lobe. No effusion or pneumothorax. Upper Abdomen: No acute abnormality. Musculoskeletal: Bilateral breast implants are noted. Degenerative changes are present in the thoracic spine. No acute osseous abnormality. Review of the MIP images confirms the above findings. IMPRESSION: 1. Bilateral pulmonary emboli extending from in the right and left pulmonary arteries bilaterally with moderate-to-large thrombus burden. The right ventricle is distended suggesting underlying right heart strain. 2. Scattered airspace disease in the right upper lobe, possible pulmonary infarcts versus other infectious or inflammatory process. 3. Small hiatal hernia. Critical findings were reported to Dr. DVeryl Speakat 4:41 a.m. Electronically Signed   By: LBrett FairyM.D.   On: 05/09/2022 04:43   DG Chest 2 View  Result Date: 05/09/2022 CLINICAL DATA:  Mid chest pain. EXAM: CHEST - 2 VIEW COMPARISON:  Jan 27, 2016 FINDINGS: The heart size and mediastinal contours are within normal limits. Both lungs are clear. Radiopaque surgical clips are seen overlying the left axilla. Additional surgical clips are seen within the right upper quadrant. The visualized skeletal structures are unremarkable. IMPRESSION: No active cardiopulmonary disease. Electronically Signed   By: TVirgina NorfolkM.D.   On: 05/09/2022 00:53     Subjective: Patient seen examined bedside, resting comfortably.  Lying in bed.  No complaints this morning.  Transition to Xarelto and discharging home.  Denies headache, no dizziness, no chest pain, no palpitations, no shortness of breath, no cough/congestion, no abdominal pain, no fever/chills/night sweats, no nausea/vomiting/diarrhea, no focal weakness, no fatigue, no paresthesias.  No acute events overnight per nurse staff.  Discharge Exam: Vitals:   05/11/22 2051 05/12/22 0507  BP: 131/83 118/74  Pulse: 96 84  Resp: 18 19  Temp: 99.8  F (37.7 C) 98.3 F (36.8 C)  SpO2: 91% 95%   Vitals:   05/11/22 0951 05/11/22 1653 05/11/22 2051 05/12/22 0507  BP: 120/69 116/70 131/83 118/74  Pulse: 78 83 96 84  Resp: '18 20 18 19  '$ Temp: 98.7 F (37.1 C) 98.7 F (37.1 C) 99.8 F (37.7 C) 98.3 F (36.8 C)  TempSrc: Oral Oral Oral Oral  SpO2: 99% 91% 91% 95%  Weight:      Height:        Physical Exam: GEN: NAD, alert and oriented x 3, wd/wn HEENT: NCAT, PERRL, EOMI, sclera clear, MMM PULM: CTAB w/o wheezes/crackles, normal respiratory effort, on room air CV: RRR w/o M/G/R GI: abd soft, NTND, NABS, no R/G/M MSK: no peripheral edema, muscle strength globally intact 5/5 bilateral upper/lower extremities NEURO: CN II-XII intact, no focal deficits, sensation to light touch intact PSYCH: normal mood/affect Integumentary: dry/intact, no rashes or wounds    The results of significant diagnostics from this hospitalization (including  imaging, microbiology, ancillary and laboratory) are listed below for reference.     Microbiology: No results found for this or any previous visit (from the past 240 hour(s)).   Labs: BNP (last 3 results) Recent Labs    05/09/22 0515  BNP 44.0   Basic Metabolic Panel: Recent Labs  Lab 05/08/22 2355 05/09/22 1120 05/10/22 0234  NA 140 141 141  K 4.0 3.8 4.1  CL 112* 113* 114*  CO2 '24 23 22  '$ GLUCOSE 140* 106* 105*  BUN '14 13 12  '$ CREATININE 0.97 0.82 0.78  CALCIUM 10.0 9.6 9.9   Liver Function Tests: Recent Labs  Lab 05/08/22 2355 05/09/22 1120 05/10/22 0234  AST '19 15 19  '$ ALT '26 22 23  '$ ALKPHOS 59 51 49  BILITOT 0.6 0.6 0.5  PROT 7.0 6.7 6.8  ALBUMIN 3.4* 3.3* 3.3*   No results for input(s): "LIPASE", "AMYLASE" in the last 168 hours. No results for input(s): "AMMONIA" in the last 168 hours. CBC: Recent Labs  Lab 05/08/22 2355 05/09/22 1140 05/10/22 0234 05/11/22 0401 05/12/22 0327  WBC 7.0 6.3 5.9 5.4 7.0  NEUTROABS 4.4  --   --   --   --   HGB 11.2* 10.3* 11.3*  10.7* 11.4*  HCT 36.5 32.8* 37.3 35.2* 37.8  MCV 83.9 83.7 86.7 84.6 85.1  PLT 293 284 277 268 279   Cardiac Enzymes: No results for input(s): "CKTOTAL", "CKMB", "CKMBINDEX", "TROPONINI" in the last 168 hours. BNP: Invalid input(s): "POCBNP" CBG: No results for input(s): "GLUCAP" in the last 168 hours. D-Dimer No results for input(s): "DDIMER" in the last 72 hours. Hgb A1c No results for input(s): "HGBA1C" in the last 72 hours. Lipid Profile No results for input(s): "CHOL", "HDL", "LDLCALC", "TRIG", "CHOLHDL", "LDLDIRECT" in the last 72 hours. Thyroid function studies No results for input(s): "TSH", "T4TOTAL", "T3FREE", "THYROIDAB" in the last 72 hours.  Invalid input(s): "FREET3" Anemia work up No results for input(s): "VITAMINB12", "FOLATE", "FERRITIN", "TIBC", "IRON", "RETICCTPCT" in the last 72 hours. Urinalysis    Component Value Date/Time   COLORURINE YELLOW 01/07/2021 1237   APPEARANCEUR HAZY (A) 01/07/2021 1237   LABSPEC 1.016 01/07/2021 1237   PHURINE 5.0 01/07/2021 1237   GLUCOSEU NEGATIVE 01/07/2021 1237   HGBUR SMALL (A) 01/07/2021 1237   BILIRUBINUR NEGATIVE 01/07/2021 1237   BILIRUBINUR - 02/21/2013 1118   KETONESUR NEGATIVE 01/07/2021 1237   PROTEINUR 30 (A) 01/07/2021 1237   UROBILINOGEN 0.2 07/15/2014 1833   NITRITE NEGATIVE 01/07/2021 1237   LEUKOCYTESUR NEGATIVE 01/07/2021 1237   Sepsis Labs Recent Labs  Lab 05/09/22 1140 05/10/22 0234 05/11/22 0401 05/12/22 0327  WBC 6.3 5.9 5.4 7.0   Microbiology No results found for this or any previous visit (from the past 240 hour(s)).   Time coordinating discharge: Over 30 minutes  SIGNED:   Donnamarie Poag [place] DO  [facility] 05/12/2022, 10:16 AM

## 2022-05-12 NOTE — TOC Transition Note (Signed)
Transition of Care [facility]) - CM/SW Discharge Note   Patient Details  Name: Christina [name] MRN: [id number] Date of Birth: 08-07-1966  [facility] Care [facility]) CM/SW Contact:  Roseanne Kaufman, RN Phone Number: 05/12/2022, 10:54 AM   Clinical Narrative:   Prior to admission DME: cane and walker. Patient will transport at discharge. [facility]: [facility] [address].   No current TOC needs.       Final next level of care: Home/Self Care Barriers to Discharge: No Barriers Identified   Patient Goals and CMS Choice Patient states their goals for this hospitalization and ongoing recovery are:: return home   Choice offered to / list presented to : NA  Discharge Placement    home                   Discharge Plan and Services In-house Referral: NA Discharge Planning Services: CM Consult Post Acute Care Choice: NA          DME Arranged: N/A DME Agency: NA       HH Arranged: NA HH Agency: NA        Social Determinants of Health (SDOH) Interventions     Readmission Risk Interventions     No data to display

## 2022-05-14 ENCOUNTER — Encounter: Payer: Self-pay | Admitting: Orthopaedic Surgery

## 2022-05-14 ENCOUNTER — Ambulatory Visit (INDEPENDENT_AMBULATORY_CARE_PROVIDER_SITE_OTHER): Payer: Commercial Managed Care - HMO

## 2022-05-14 ENCOUNTER — Encounter: Payer: 59 | Admitting: Rehabilitative and Restorative Service Providers"

## 2022-05-14 ENCOUNTER — Ambulatory Visit (INDEPENDENT_AMBULATORY_CARE_PROVIDER_SITE_OTHER): Payer: Commercial Managed Care - HMO | Admitting: Orthopaedic Surgery

## 2022-05-14 DIAGNOSIS — Z96651 Presence of right artificial knee joint: Secondary | ICD-10-CM

## 2022-05-14 NOTE — Progress Notes (Signed)
Post-Op Visit Note   Patient: Christina HOLTROP           Date of Birth: 1966/05/09           MRN: [id number] Visit Date: 05/14/2022 PCP: Maurice Small, MD   Assessment & Plan:  Chief Complaint:  Chief Complaint  Patient presents with   Right Knee - Follow-up    Right total knee arthroplasty 03/31/2022   Visit Diagnoses:  1. Hx of total knee replacement, right     Plan: Jaquila is 6 weeks status post right total knee on 03/31/2022.  Was recently hospitalized for DVT and PE this past weekend.  She currently on Xarelto.  Doing okay overall.  Examination of right lower extremity shows expected postoperative swelling.  Surgical scars well-healed.  Range of motion is about 5 to 95 degrees.  X-rays show stable implant.  Moderate swelling throughout the extremity.  From my standpoint her range of motion and function are improving very well.  She will continue outpatient PT at our office.  We will see her back in 6 weeks for 68-monthrecheck.  Dental prophylaxis reinforced.  Follow-Up Instructions: Return in about 6 weeks (around 06/25/2022).   Orders:  Orders Placed This Encounter  Procedures   XR Knee 1-2 Views Right   No orders of the defined types were placed in this encounter.   Imaging: XR Knee 1-2 Views Right  Result Date: 05/14/2022 Stable total knee replacement in good alignment    PMFS History: Patient Active Problem List   Diagnosis Date Noted   DVT (deep venous thrombosis) ([facility] 05/11/2022   Acute pulmonary embolism ([facility] 05/09/2022   Chest pain 05/09/2022   Elevated troponin 05/09/2022   Status post total right knee replacement 03/31/2022   Primary osteoarthritis of right knee 03/20/2022   Eating disorder 03/20/2022   Acute medial meniscus tear, right, initial encounter 10/01/2021   At risk for dehydration 01/28/2021   At risk for malnutrition 01/01/2021   Obesity, Class I, BMI 30-34.9 12/31/2020   Vitamin B12 deficiency 10/08/2020   At risk for depression  10/08/2020   Mood disorder ([facility], with emotional eating 09/24/2020   At risk for impaired metabolic function 191/79/1505  At risk for diabetes mellitus 07/30/2020   Vitamin D deficiency 07/02/2020   Depression 07/02/2020   At risk for side effect of medication 07/02/2020   Stress due to illness of family member-  daughter with drug addiction 05/15/2020   Depression, recurrent ([facility] 05/15/2020   Anemia 05/15/2020   Constipation 05/15/2020   Prediabetes 05/15/2020   Breast asymmetry following reconstructive surgery 04/24/2020   Acquired absence of breast 04/24/2020   S/P breast reconstruction, bilateral 04/24/2020   Acute medial meniscus tear of left knee 12/20/2019   Breast cancer, left ([facility] 01/09/2016   Genetic testing 069/79/4801  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   Family history of breast cancer in female 11/08/2015   Family history of colon cancer 11/08/2015   Malignant neoplasm of central portion of left breast in female, estrogen receptor positive ([facility] 10/31/2015   Endometriosis 03/11/2011   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, left oncologist--- dr gLindi Adie  dx 02/ 2017,  multifocal IDC, DCIS, ER/PR+,  01-09-2016 s/p bilteral mastectomies w/ left sln bx;  no chemoradiation   Cataracts, bilateral    Depression    Diabetes mellitus without  complication (HCC)    type 2   GAD (generalized anxiety disorder)    Gallbladder problem    History of ovarian cyst    IBS (irritable bowel syndrome)    Joint pain    PONV (postoperative nausea and vomiting)    does well with scop patch   Retinal detachment    Rheg OS   Right knee meniscal tear    Urgency of urination     Family History  Problem Relation Age of Onset   Heart failure Father    Prostate cancer Father 6   Retinal detachment Father    Colon polyps Mother        approx 2   Other Mother        hx HPV and  hysterectomy due to precancerous cells   Depression Mother    Anxiety disorder Mother    Other Sister 49       hx of hysterectomy for unspecified reason; still has ovaries   Other Sister 63       paternal half-sister hx of hysterectomy for unspecified reason; still has ovaries   Bladder Cancer Maternal Uncle 79       not a smoker   Kidney failure Maternal Grandmother    Congestive Heart Failure Maternal Grandmother    Colon cancer Maternal Grandmother 70   Diabetes Maternal Grandmother    Lung cancer Maternal Grandfather 89       smoker   Breast cancer Paternal Grandmother        dx. early 47s; w/ hx of trauma to breast   Crohn's disease Daughter    Lung cancer Maternal Uncle 37       smoker    Past Surgical History:  Procedure Laterality Date   ABDOMINAL HYSTERECTOMY  05/1996   endometriosis   ACHILLES TENDON REPAIR Right 2010;  revision 2011   BLADDER SUSPENSION  2000   BREAST BIOPSY Left 10/2015   BREAST IMPLANT REMOVAL Bilateral 08/12/2017   Procedure: REMOVAL BILATERAL BREAST IMPLANTS;  Surgeon: Wallace Going, DO;  Location: [place];  Service: Plastics;  Laterality: Bilateral;   BREAST RECONSTRUCTION WITH PLACEMENT OF TISSUE EXPANDER AND FLEX HD (ACELLULAR HYDRATED DERMIS) Bilateral 01/09/2016   BREAST RECONSTRUCTION WITH PLACEMENT OF TISSUE EXPANDER AND FLEX HD (ACELLULAR HYDRATED DERMIS) Bilateral 01/09/2016   Procedure: BREAST RECONSTRUCTION WITH PLACEMENT OF TISSUE EXPANDER AND FLEX HD (ACELLULAR HYDRATED DERMIS);  Surgeon: Loel Lofty Dillingham, DO;  Location: [place];  Service: Plastics;  Laterality: Bilateral;   BREAST RECONSTRUCTION WITH PLACEMENT OF TISSUE EXPANDER AND FLEX HD (ACELLULAR HYDRATED DERMIS) Bilateral 05/29/2016   Procedure: PLACEMENT OF BILATERAL TISSUE EXPANDER AND FLEX HD (ACELLULAR HYDRATED DERMIS);  Surgeon: Wallace Going, DO;  Location: [place];  Service: Plastics;  Laterality: Bilateral;   BREAST REDUCTION  SURGERY Bilateral 11/26/2016   Procedure: BILATERAL BREAST CAPSULE CONTRACTURE RELASE;  Surgeon: Wallace Going, DO;  Location: [place];  Service: Plastics;  Laterality: Bilateral;   [place]; 1989; [place] Right x3   last one 02-01-2019 @ [facility]   EYE SURGERY Left 10/01/2020   Pneumatic retinopexy for repair of rheg RD - Dr. Bernarda Caffey   EYE SURGERY Left 10/04/2020   PPV - Dr. Bernarda Caffey   FAT GRAFTING BILATERAL BREAST  08-09-2018  '@[facility]'    GAS INSERTION Left 10/04/2020   Procedure: INSERTION OF GAS;  Surgeon: Bernarda Caffey, MD;  Location: [place];  Service: Ophthalmology;  Laterality: Left;   GAS/FLUID EXCHANGE Left 10/04/2020   Procedure: GAS/FLUID EXCHANGE;  Surgeon: Bernarda Caffey, MD;  Location: [place];  Service: Ophthalmology;  Laterality: Left;   INCISION AND DRAINAGE OF WOUND Bilateral 02/11/2016   Procedure: IRRIGATION AND DEBRIDEMENT OF BILATERAL BREAST POCKET;  Surgeon: Wallace Going, DO;  Location: [place];  Service: Plastics;  Laterality: Bilateral;   KNEE ARTHROSCOPY WITH MEDIAL MENISECTOMY Right 12/20/2019   Procedure: KNEE ARTHROSCOPY WITH MEDIAL MENISECTOMY;  Surgeon: Marchia Bond, MD;  Location: [place];  Service: Orthopedics;  Laterality: Right;   KNEE ARTHROSCOPY WITH MEDIAL MENISECTOMY Right 10/30/2021   Procedure: RIGHT KNEE ARTHROSCOPY WITH PARTIAL MEDIAL MENISECTOMY SYNOVECTOMY;  Surgeon: Leandrew Koyanagi, MD;  Location: [place];  Service: Orthopedics;  Laterality: Right;   LAPAROSCOPIC APPENDECTOMY  04-07-2011   '@[facility]'    w/ Excision peritoneal lipoma and lysis adhesions   LAPAROSCOPIC CHOLECYSTECTOMY  ~ [place] Right 10/04/2020   Procedure: LASER RETINOPEXY WITH INDIRECT LASER OPTHALMOSCOPE, RIGHT EYE;  Surgeon: Bernarda Caffey, MD;  Location: [place];  Service: Ophthalmology;  Laterality: Right;   LIPOSUCTION WITH LIPOFILLING Bilateral 11/26/2016    Procedure: LIPOFILLING FOR SYMMETRY;  Surgeon: Wallace Going, DO;  Location: [place];  Service: Plastics;  Laterality: Bilateral;   LIPOSUCTION WITH LIPOFILLING Bilateral 01/21/2017   Procedure: BILATERAL BREAST  LIPOFILLING FOR ASYMMETRY;  Surgeon: Wallace Going, DO;  Location: [place];  Service: Plastics;  Laterality: Bilateral;   LIPOSUCTION WITH LIPOFILLING Bilateral 06/28/2020   Procedure: Lipofilling bilateral breasts for asymmetry;  Surgeon: Wallace Going, DO;  Location: [place];  Service: Plastics;  Laterality: Bilateral;  90 min   MASTECTOMY Bilateral 01/09/2016   NIPPLE SPARING MASTECTOMY/SENTINAL LYMPH NODE BIOPSY/RECONSTRUCTION/PLACEMENT OF TISSUE EXPANDER Bilateral 01/09/2016   Procedure: BILATERAL NIPPLE SPARING MASTECTOMY WITH LEFT SENTINAL LYMPH NODE BIOPSY ;  Surgeon: Stark Klein, MD;  Location: [place];  Service: General;  Laterality: Bilateral;   PHOTOCOAGULATION WITH LASER Left 10/04/2020   Procedure: PHOTOCOAGULATION WITH LASER;  Surgeon: Bernarda Caffey, MD;  Location: [place];  Service: Ophthalmology;  Laterality: Left;   REMOVAL OF BILATERAL TISSUE EXPANDERS WITH PLACEMENT OF BILATERAL BREAST IMPLANTS Bilateral 08/20/2016   Procedure: REMOVAL OF BILATERAL TISSUE EXPANDERS WITH PLACEMENT OF BILATERAL SILICONE IMPLANTS;  Surgeon: Wallace Going, DO;  Location: [place];  Service: Plastics;  Laterality: Bilateral;   REMOVAL OF BILATERAL TISSUE EXPANDERS WITH PLACEMENT OF BILATERAL BREAST IMPLANTS Bilateral 11/05/2017   Procedure: REMOVAL OF BILATERAL TISSUE EXPANDERS WITH PLACEMENT OF BILATERAL BREAST SILICONE IMPLANTS;  Surgeon: Wallace Going, DO;  Location: [place];  Service: Plastics;  Laterality: Bilateral;   REMOVAL OF TISSUE EXPANDER Bilateral 02/11/2016   Procedure: REMOVAL OF BILATERAL TISSUE EXPANDERS AND FLEX HD REMOVAL;  Surgeon: Wallace Going, DO;  Location:  [place];  Service: Plastics;  Laterality: Bilateral;   RETINAL DETACHMENT SURGERY Left 10/01/2020   Pneumatic retinopexy for repair of rheg RD - Dr. Bernarda Caffey   RETINAL DETACHMENT SURGERY Left 10/04/2020   PPV - Dr. Bernarda Caffey   SCLERAL BUCKLE Left 10/04/2020   Procedure: SCLERAL BUCKLE LEFT EYE;  Surgeon: Bernarda Caffey, MD;  Location: [place];  Service: Ophthalmology;  Laterality: Left;   TISSUE EXPANDER PLACEMENT Bilateral 08/12/2017   Procedure: PLACEMENT OF BILATERAL TISSUE EXPANDER;  Surgeon: Wallace Going, DO;  Location: [place];  Service: Plastics;  Laterality:  Bilateral;   TOTAL KNEE ARTHROPLASTY Right 03/31/2022   Procedure: RIGHT TOTAL KNEE REPLACEMENT;  Surgeon: Leandrew Koyanagi, MD;  Location: [place];  Service: Orthopedics;  Laterality: Right;   TUBAL LIGATION Bilateral 1991   VITRECTOMY 25 GAUGE WITH SCLERAL BUCKLE Left 10/04/2020   Procedure: 25 GAUGE PARS PLANA VITRECTOMY LEFT EYE ;  Surgeon: Bernarda Caffey, MD;  Location: [place];  Service: Ophthalmology;  Laterality: Left;   Social History   Occupational History   Occupation: Holiday representative: Education administrator  Tobacco Use   Smoking status: Former    Packs/day: 1.00    Years: 10.00    Total pack years: 10.00    Types: Cigarettes    Quit date: 06/08/2008    Years since quitting: 13.9   Smokeless tobacco: Never  Vaping Use   Vaping Use: Never used  Substance and Sexual Activity   Alcohol use: No   Drug use: Never   Sexual activity: Never    Birth control/protection: Surgical    Comment: hysterectomy

## 2022-05-15 ENCOUNTER — Telehealth: Payer: Self-pay

## 2022-05-15 NOTE — Telephone Encounter (Signed)
Received secure chat from [facility] NP requesting assistance w/medication Xarelto. Patient having difficulty with insurance prior auth.    This RNCM spoke with [place] who advised the insurance will cover at $15 copay. PA has been completed. Attempted the inhouse Xarelto coupon which increased medication cost. Patient will use her insurance cost of $15.   Left voicemail message for patient to contact her pharmacy.    No additional TOC needs

## 2022-05-16 ENCOUNTER — Telehealth: Payer: Self-pay | Admitting: Rehabilitative and Restorative Service Providers"

## 2022-05-16 ENCOUNTER — Encounter: Payer: 59 | Admitting: Rehabilitative and Restorative Service Providers"

## 2022-05-16 NOTE — Telephone Encounter (Signed)
Left a message to check on her after no show #2.  Encouraged her to schedule at a time she could attend and call to cancel if unable to attend.  Left [phone] number to contact us.

## 2022-05-20 ENCOUNTER — Telehealth: Payer: Self-pay | Admitting: Hematology and Oncology

## 2022-05-20 NOTE — Telephone Encounter (Signed)
Called pt to sch f/u with Dr. Lindi Adie per 9/12 referral from Dr. Amedeo Plenty. NO answer. Left msg for pt to call back to sch appt.   Referral closed due to pt already being established.

## 2022-05-26 ENCOUNTER — Ambulatory Visit (INDEPENDENT_AMBULATORY_CARE_PROVIDER_SITE_OTHER): Payer: Commercial Managed Care - HMO | Admitting: Physical Therapy

## 2022-05-26 ENCOUNTER — Telehealth: Payer: Self-pay | Admitting: Hematology and Oncology

## 2022-05-26 ENCOUNTER — Encounter: Payer: Self-pay | Admitting: Physical Therapy

## 2022-05-26 DIAGNOSIS — M6281 Muscle weakness (generalized): Secondary | ICD-10-CM | POA: Diagnosis not present

## 2022-05-26 DIAGNOSIS — R262 Difficulty in walking, not elsewhere classified: Secondary | ICD-10-CM

## 2022-05-26 DIAGNOSIS — M25561 Pain in right knee: Secondary | ICD-10-CM | POA: Diagnosis not present

## 2022-05-26 DIAGNOSIS — G8929 Other chronic pain: Secondary | ICD-10-CM

## 2022-05-26 DIAGNOSIS — M25661 Stiffness of right knee, not elsewhere classified: Secondary | ICD-10-CM | POA: Diagnosis not present

## 2022-05-26 DIAGNOSIS — R600 Localized edema: Secondary | ICD-10-CM | POA: Diagnosis not present

## 2022-05-26 NOTE — Therapy (Signed)
OUTPATIENT PHYSICAL THERAPY TREATMENT NOTE   Patient Name: Christina [name] MRN: [id number] DOB:[birth year], 56 y.o., female Today's Date: 05/26/2022  PCP: Maurice Small, MD REFERRING PROVIDER: Leandrew Koyanagi, MD  END OF SESSION:   PT End of Session - 05/26/22 1422     Visit Number 5    Number of Visits 20    Date for PT Re-Evaluation 06/30/22    PT Start Time 1346    PT Stop Time 1430    PT Time Calculation (min) 44 min    Activity Tolerance Patient tolerated treatment well    Behavior During Therapy [facility] for tasks assessed/performed                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,  multifocal IDC, DCIS, ER/PR+,  01-09-2016 s/p bilteral mastectomies w/ left sln bx;  no chemoradiation   Cataracts, bilateral    Depression    Diabetes mellitus without complication ([place])    type 2   GAD (generalized anxiety disorder)    Gallbladder problem    History of ovarian cyst    IBS (irritable bowel syndrome)    Joint pain    PONV (postoperative nausea and vomiting)    does well with scop patch   Retinal detachment    Rheg OS   Right knee meniscal tear    Urgency of urination    Past Surgical History:  Procedure Laterality Date   ABDOMINAL HYSTERECTOMY  05/1996   endometriosis   ACHILLES TENDON REPAIR Right 2010;  revision 2011   BLADDER SUSPENSION  2000   BREAST BIOPSY Left 10/2015   BREAST IMPLANT REMOVAL Bilateral 08/12/2017   Procedure: REMOVAL BILATERAL BREAST IMPLANTS;  Surgeon: Wallace Going, DO;  Location: [place];  Service: Plastics;  Laterality: Bilateral;   BREAST RECONSTRUCTION WITH PLACEMENT OF TISSUE EXPANDER AND FLEX HD (ACELLULAR HYDRATED DERMIS) Bilateral 01/09/2016   BREAST RECONSTRUCTION WITH PLACEMENT OF TISSUE EXPANDER AND FLEX HD (ACELLULAR HYDRATED DERMIS) Bilateral 01/09/2016   Procedure: BREAST  RECONSTRUCTION WITH PLACEMENT OF TISSUE EXPANDER AND FLEX HD (ACELLULAR HYDRATED DERMIS);  Surgeon: Loel Lofty Dillingham, DO;  Location: [place];  Service: Plastics;  Laterality: Bilateral;   BREAST RECONSTRUCTION WITH PLACEMENT OF TISSUE EXPANDER AND FLEX HD (ACELLULAR HYDRATED DERMIS) Bilateral 05/29/2016   Procedure: PLACEMENT OF BILATERAL TISSUE EXPANDER AND FLEX HD (ACELLULAR HYDRATED DERMIS);  Surgeon: Wallace Going, DO;  Location: [place];  Service: Plastics;  Laterality: Bilateral;   BREAST REDUCTION SURGERY Bilateral 11/26/2016   Procedure: BILATERAL BREAST CAPSULE CONTRACTURE RELASE;  Surgeon: Wallace Going, DO;  Location: [place];  Service: Plastics;  Laterality: Bilateral;   [place]; 1989; [place] Right x3   last one 02-01-2019 @ [facility]   EYE SURGERY Left 10/01/2020   Pneumatic retinopexy for repair of rheg RD - Dr. Bernarda Caffey   EYE SURGERY Left 10/04/2020   PPV - Dr. Bernarda Caffey   FAT GRAFTING BILATERAL BREAST  08-09-2018  '@[facility]'    GAS INSERTION Left 10/04/2020   Procedure: INSERTION OF GAS;  Surgeon: Bernarda Caffey, MD;  Location: [place];  Service: Ophthalmology;  Laterality: Left;   GAS/FLUID EXCHANGE Left 10/04/2020   Procedure: GAS/FLUID EXCHANGE;  Surgeon: Bernarda Caffey, MD;  Location: [place];  Service:  Ophthalmology;  Laterality: Left;   INCISION AND DRAINAGE OF WOUND Bilateral 02/11/2016   Procedure: IRRIGATION AND DEBRIDEMENT OF BILATERAL BREAST POCKET;  Surgeon: Wallace Going, DO;  Location: [place];  Service: Plastics;  Laterality: Bilateral;   KNEE ARTHROSCOPY WITH MEDIAL MENISECTOMY Right 12/20/2019   Procedure: KNEE ARTHROSCOPY WITH MEDIAL MENISECTOMY;  Surgeon: Marchia Bond, MD;  Location: [place];  Service: Orthopedics;  Laterality: Right;   KNEE ARTHROSCOPY WITH MEDIAL MENISECTOMY Right 10/30/2021   Procedure: RIGHT KNEE ARTHROSCOPY WITH PARTIAL MEDIAL  MENISECTOMY SYNOVECTOMY;  Surgeon: Leandrew Koyanagi, MD;  Location: [place];  Service: Orthopedics;  Laterality: Right;   LAPAROSCOPIC APPENDECTOMY  04-07-2011   '@[facility]'    w/ Excision peritoneal lipoma and lysis adhesions   LAPAROSCOPIC CHOLECYSTECTOMY  ~ [place] Right 10/04/2020   Procedure: LASER RETINOPEXY WITH INDIRECT LASER OPTHALMOSCOPE, RIGHT EYE;  Surgeon: Bernarda Caffey, MD;  Location: [place];  Service: Ophthalmology;  Laterality: Right;   LIPOSUCTION WITH LIPOFILLING Bilateral 11/26/2016   Procedure: LIPOFILLING FOR SYMMETRY;  Surgeon: Wallace Going, DO;  Location: [place];  Service: Plastics;  Laterality: Bilateral;   LIPOSUCTION WITH LIPOFILLING Bilateral 01/21/2017   Procedure: BILATERAL BREAST  LIPOFILLING FOR ASYMMETRY;  Surgeon: Wallace Going, DO;  Location: [place];  Service: Plastics;  Laterality: Bilateral;   LIPOSUCTION WITH LIPOFILLING Bilateral 06/28/2020   Procedure: Lipofilling bilateral breasts for asymmetry;  Surgeon: Wallace Going, DO;  Location: [place];  Service: Plastics;  Laterality: Bilateral;  90 min   MASTECTOMY Bilateral 01/09/2016   NIPPLE SPARING MASTECTOMY/SENTINAL LYMPH NODE BIOPSY/RECONSTRUCTION/PLACEMENT OF TISSUE EXPANDER Bilateral 01/09/2016   Procedure: BILATERAL NIPPLE SPARING MASTECTOMY WITH LEFT SENTINAL LYMPH NODE BIOPSY ;  Surgeon: Stark Klein, MD;  Location: [place];  Service: General;  Laterality: Bilateral;   PHOTOCOAGULATION WITH LASER Left 10/04/2020   Procedure: PHOTOCOAGULATION WITH LASER;  Surgeon: Bernarda Caffey, MD;  Location: [place];  Service: Ophthalmology;  Laterality: Left;   REMOVAL OF BILATERAL TISSUE EXPANDERS WITH PLACEMENT OF BILATERAL BREAST IMPLANTS Bilateral 08/20/2016   Procedure: REMOVAL OF BILATERAL TISSUE EXPANDERS WITH PLACEMENT OF BILATERAL SILICONE IMPLANTS;  Surgeon: Wallace Going, DO;  Location: [place];   Service: Plastics;  Laterality: Bilateral;   REMOVAL OF BILATERAL TISSUE EXPANDERS WITH PLACEMENT OF BILATERAL BREAST IMPLANTS Bilateral 11/05/2017   Procedure: REMOVAL OF BILATERAL TISSUE EXPANDERS WITH PLACEMENT OF BILATERAL BREAST SILICONE IMPLANTS;  Surgeon: Wallace Going, DO;  Location: [place];  Service: Plastics;  Laterality: Bilateral;   REMOVAL OF TISSUE EXPANDER Bilateral 02/11/2016   Procedure: REMOVAL OF BILATERAL TISSUE EXPANDERS AND FLEX HD REMOVAL;  Surgeon: Wallace Going, DO;  Location: [place];  Service: Plastics;  Laterality: Bilateral;   RETINAL DETACHMENT SURGERY Left 10/01/2020   Pneumatic retinopexy for repair of rheg RD - Dr. Bernarda Caffey   RETINAL DETACHMENT SURGERY Left 10/04/2020   PPV - Dr. Bernarda Caffey   SCLERAL BUCKLE Left 10/04/2020   Procedure: SCLERAL BUCKLE LEFT EYE;  Surgeon: Bernarda Caffey, MD;  Location: [place];  Service: Ophthalmology;  Laterality: Left;   TISSUE EXPANDER PLACEMENT Bilateral 08/12/2017   Procedure: PLACEMENT OF BILATERAL TISSUE EXPANDER;  Surgeon: Wallace Going, DO;  Location: [place];  Service: Plastics;  Laterality: Bilateral;   TOTAL KNEE ARTHROPLASTY Right 03/31/2022   Procedure: RIGHT TOTAL KNEE REPLACEMENT;  Surgeon: Leandrew Koyanagi, MD;  Location: [facility];  Service: Orthopedics;  Laterality: Right;   TUBAL LIGATION Bilateral 1991   VITRECTOMY 25 GAUGE WITH SCLERAL BUCKLE Left 10/04/2020   Procedure: 25 GAUGE PARS PLANA VITRECTOMY LEFT EYE ;  Surgeon: Bernarda Caffey, MD;  Location: [place];  Service: Ophthalmology;  Laterality: Left;   Patient Active Problem List   Diagnosis Date Noted   DVT (deep venous thrombosis) ([place]) 05/11/2022   Acute pulmonary embolism ([place]) 05/09/2022   Chest pain 05/09/2022   Elevated troponin 05/09/2022   Status post total right knee replacement 03/31/2022   Primary osteoarthritis of right knee 03/20/2022   Eating disorder 03/20/2022   Acute  medial meniscus tear, right, initial encounter 10/01/2021   At risk for dehydration 01/28/2021   At risk for malnutrition 01/01/2021   Obesity, Class I, BMI 30-34.9 12/31/2020   Vitamin B12 deficiency 10/08/2020   At risk for depression 10/08/2020   Mood disorder ([place]), with emotional eating 09/24/2020   At risk for impaired metabolic function 49/20/1007   At risk for diabetes mellitus 07/30/2020   Vitamin D deficiency 07/02/2020   Depression 07/02/2020   At risk for side effect of medication 07/02/2020   Stress due to illness of family member-  daughter with drug addiction 05/15/2020   Depression, recurrent ([place]) 05/15/2020   Anemia 05/15/2020   Constipation 05/15/2020   Prediabetes 05/15/2020   Breast asymmetry following reconstructive surgery 04/24/2020   Acquired absence of breast 04/24/2020   S/P breast reconstruction, bilateral 04/24/2020   Acute medial meniscus tear of left knee 12/20/2019   Breast cancer, left ([place]) 01/09/2016   Genetic testing 08/27/7587   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   Family history of breast cancer in female 11/08/2015   Family history of colon cancer 11/08/2015   Malignant neoplasm of central portion of left breast in female, estrogen receptor positive ([place]) 10/31/2015   Endometriosis 03/11/2011    REFERRING DIAG: T25.498 (ICD-10-CM) - Hx of total knee replacement, right  THERAPY DIAG:  Chronic pain of right knee  Muscle weakness (generalized)  Stiffness of right knee, not elsewhere classified  Localized edema  Difficulty in walking, not elsewhere classified  Rationale for Evaluation and Treatment Rehabilitation  PERTINENT HISTORY: Bilateral hands and knees OA, type 2 DM, breast cancer, anxiety, 2010/11 Rt Achilles repair/revision, Rt elbow surgery, 2 previous Rt knee scopes, obesity  PRECAUTIONS: None  SUBJECTIVE: Pt reports being in the hospital due to blood clots. She states that since last session she has been having a  lot of pain due to swelling and stiffness.   PAIN:  Are you having pain? Yes: NPRS scale: 0/10, ranged 0-8/10 since last appointment Pain location: Medial Rt knee Pain description: Achy, stiff Aggravating factors: Prolonged postures, prolonged WB Relieving factors: Ice and tylenol   OBJECTIVE: (objective measures completed at initial evaluation unless otherwise dated)  OBJECTIVE:  PATIENT SURVEYS:  04/21/2022 FOTO intake: 35  predicted:  63   COGNITION: 04/21/2022            Overall cognitive status: WFL              EDEMA:  04/21/2022 Visible edema noted in knee lower leg and ankle on Rt.    MUSCLE LENGTH: 04/21/2022 None performed    PALPATION: 04/21/2022 Tenderness surrounding incision and knee jt line Rt   LOWER EXTREMITY ROM:   ROM Right 04/21/2022 Left 04/21/2022 Right 04/23/2022 Right 05/02/22  Hip flexion        Hip extension  Hip abduction        Hip adduction        Hip internal rotation        Hip external rotation        Knee flexion 100 in supine heel slide AROM   108 supine AROM 102 seated AROM 108 supine AROM  Knee extension -10 seated LAQ AROM   -3 supine AROM -10 seated LAQ AROM -4 supine leg lift AROM  Ankle dorsiflexion        Ankle plantarflexion        Ankle inversion        Ankle eversion         (Blank rows = not tested)   LOWER EXTREMITY MMT:   MMT Right 04/21/2022 Left 04/21/2022  Hip flexion 5/5 5/5  Hip extension      Hip abduction      Hip adduction      Hip internal rotation      Hip external rotation      Knee flexion 4/5 5/5  Knee extension 3+/5 5/5  Ankle dorsiflexion 5/5 5/5  Ankle plantarflexion      Ankle inversion      Ankle eversion       (Blank rows = not tested)   LOWER EXTREMITY SPECIAL TESTS:  04/21/2022 Lt SLS 5 seconds.  Rt SLS unable assisted   FUNCTIONAL TESTS:  04/21/2022 Sit to stand to sit from 18 inch table height s UE assist c deviation to Lt leg in movement.    GAIT: 04/21/2022 Ambulation c  SPC in Lt UE.  Lateral trunk lean to Lt in stance, reduced stance on Rt leg, maintained knee flexion in stance. Antalgic gait noted.        TODAY'S TREATMENT: 05/26/22  NuStep lvl 6, 10 min for increased mobility.   Retrograde massage for edema LE elevated  STM to R quad  Ankle pumps in elevated position x30   Vasocompression to ankle and knee for 15 min due to significant swelling in L LE.   05/07/22  Scifit bike for ROM seat 11--> seat 9 full rotations  Retrograde massage for edema LE elevated  STM to R quad   Overpressure into extension and flexion x10 each   Tandem stance 3x30 seconds B  Narrow BOS eyes shut 30 seconds x3  Box taps while standing on blue foam pad x30   Ongoing education about edema management, use of vaso, elevation, etc   05/02/2022 TherEx: Recumbent bike Seat 6 for 8 minutes level 1 full AROM Leg Press 50# Rt leg only 2 sets of 10 slow eccentrics, some increased knee pain Seated straight leg raise x 10 reps, added to HEP STS with cueing for eccentric focus from 22" mat height with intermittent UE use x 6, added to HEP  Neuro Re-ed: Standing heel toe raises with no UE support x 10, reduced control with lowering Tandem stance 2 x 30s bil., few instances of single UE support to regain balance Foam normal BOS x 15s no UE Foam narrow BOS x 30s no UE, increased postural sway  TherAct: PT education on use of cane for balance and WB support outside of home, pt verbalized understanding  Self Care: PT educated on indication and options for elevation of RLE throughout day to manage swelling, pt verbalized understanding  04/23/2022 Recumbent bike Seat 8 for 8 minutes full AROM Seated knee AAROM flexion and extension 10X 5 seconds each Quadriceps sets 3 sets of 10 for 5  seconds with heel prop Supine knee flexion 10X 5 seconds Verbal review of remaining HEP  Functional Activities for steps and sit to stand:  Leg Press 50# R leg only 2 sets of 10 slow  eccentrics  Vaso R knee 10 minutes post-activity 34* Medium Pressure  04/21/2022 Therex:    HEP instruction/performance c cues for techniques, handout provided.  Trial set performed of each for comprehension and symptom assessment.  See below for exercise list       PATIENT EDUCATION:  Education details: HEP, POC Person educated: Patient Education method: Explanation, Demonstration, Verbal cues, and Handouts Education comprehension: verbalized understanding, returned demonstration, and verbal cues required       HOME EXERCISE PROGRAM: Access Code: [email or web address] URL: [email or web address] Date: 05/02/2022 Prepared by: Scot Jun  Exercises - Supine Heel Slide  - 2 x daily - 7 x weekly - 3 sets - 10 reps - 2 hold - Seated Long Arc Quad  - 2 x daily - 7 x weekly - 3 sets - 10 reps - 2 hold - Supine Knee Extension Mobilization with Weight  - 2-4 x daily - 7 x weekly - 1 sets - 1 reps - to tolerance up to 15 mins hold - Seated Knee Flexion Extension AAROM with Overpressure  - 2-3 x daily - 7 x weekly - 1 sets - 10 reps - 5 hold - Seated Passive Knee Extension with Weight  - 2-4 x daily - 7 x weekly - 1 sets - 1 reps - to tolerance hold - Sit to Stand  - 1-2 x daily - 7 x weekly - 1-2 sets - 10 reps - Seated Straight Leg Heel Taps  - 1-2 x daily - 7 x weekly - 1-2 sets - 10 reps   ASSESSMENT:   CLINICAL IMPRESSION: Angeliah arrives to PT with significant swelling in her L LE. She states that she was admitted to the hospital over labor day weekend due to blood clots in her lungs and L LE. She reports perfoming ankle pumps and evelating her leg, but is unable to get the swelling out of her L LE. Retrograde edema with no changes noted due to distension of L LE. Applied game ready to L ankle and knee for 15 min with leg elevated to help reduce swelling. Pt will continue to benefit from skilled PT to address continued deficits.    OBJECTIVE IMPAIRMENTS Abnormal gait, decreased  activity tolerance, decreased balance, decreased coordination, decreased endurance, decreased mobility, difficulty walking, decreased ROM, decreased strength, hypomobility, increased edema, increased fascial restrictions, impaired perceived functional ability, increased muscle spasms, impaired flexibility, improper body mechanics, and pain.    ACTIVITY LIMITATIONS carrying, lifting, bending, sitting, standing, squatting, sleeping, stairs, transfers, bed mobility, bathing, toileting, dressing, reach over head, and locomotion level   PARTICIPATION LIMITATIONS: meal prep, cleaning, laundry, interpersonal relationship, driving, shopping, community activity, and occupation   PERSONAL FACTORS 3+ comorbidities: Bilateral hands and knees OA, type 2 DM, breast cancer, anxiety, 2010/11 Rt Achilles repair/revision, Rt elbow surgery, 2 previous Rt knee scopes, obesity  are also affecting patient's functional outcome.    REHAB POTENTIAL: Good   CLINICAL DECISION MAKING: Stable/uncomplicated   EVALUATION COMPLEXITY: Low     GOALS: Goals reviewed with patient? Yes   Short term PT Goals (target date for Short term goals are 3 weeks 05/12/2022) Patient will demonstrate independent use of home exercise program to maintain progress from in clinic treatments. Goal status: on going- 05/02/2022   Long term PT goals (target  dates for all long term goals are 10 weeks  06/30/2022 )   1. Patient will demonstrate/report pain at worst less than or equal to 2/10 to facilitate minimal limitation in daily activity secondary to pain symptoms. Goal status: New   2. Patient will demonstrate independent use of home exercise program to facilitate ability to maintain/progress functional gains from skilled physical therapy services. Goal status: New   3. Patient will demonstrate FOTO outcome > or = 63 % to indicate reduced disability due to condition. Goal status: New   4.  Patient will demonstrate Rt LE MMT 5/5 throughout  to faciltiate usual transfers, stairs, squatting at [facility] for daily life.    Goal status: New   5. Patient will demonstrate Rt knee AROM 0-110 degrees to facilitate ability to perform transfers, sitting, ambulation, stair navigation s restriction due to mobility.   Goal status: New   6.  Patient will demonstrate independent ambulation community distances > 300 ft.   Goal status: New   7.  Patient will demonstrate/report ability to ascend/descend stairs c reciprocal gait pattern s UE assist for community integration.  a.  Goal Status: New       PLAN:   PT FREQUENCY: 2x/week   PT DURATION: 10 weeks   PLANNED INTERVENTIONS: Therapeutic exercises, Therapeutic activity, Neuro Muscular re-education, Balance training, Gait training, Patient/Family education, Joint mobilization, Stair training, DME instructions, Dry Needling, Electrical stimulation, Cryotherapy, Moist heat, Taping, Ultrasound, Ionotophoresis 39m/ml Dexamethasone, and Manual therapy.  All included unless contraindicated     PLAN FOR NEXT SESSION: Continue to progress quad strengthening and active range, static balance. Review HEP updates and progress as indicated. Vaso to end  [employer]PT, DPT 05/26/22  2:24 PM

## 2022-05-26 NOTE — Telephone Encounter (Signed)
R/s pt's appt with Dr. Lindi Adie per pt request. Pt is aware of new appt date/time.

## 2022-05-28 ENCOUNTER — Encounter: Payer: Self-pay | Admitting: Rehabilitative and Restorative Service Providers"

## 2022-05-28 ENCOUNTER — Ambulatory Visit (INDEPENDENT_AMBULATORY_CARE_PROVIDER_SITE_OTHER): Payer: Commercial Managed Care - HMO | Admitting: Rehabilitative and Restorative Service Providers"

## 2022-05-28 DIAGNOSIS — R600 Localized edema: Secondary | ICD-10-CM | POA: Diagnosis not present

## 2022-05-28 DIAGNOSIS — M25561 Pain in right knee: Secondary | ICD-10-CM

## 2022-05-28 DIAGNOSIS — R262 Difficulty in walking, not elsewhere classified: Secondary | ICD-10-CM

## 2022-05-28 DIAGNOSIS — M25661 Stiffness of right knee, not elsewhere classified: Secondary | ICD-10-CM

## 2022-05-28 DIAGNOSIS — G8929 Other chronic pain: Secondary | ICD-10-CM

## 2022-05-28 DIAGNOSIS — M6281 Muscle weakness (generalized): Secondary | ICD-10-CM | POA: Diagnosis not present

## 2022-05-28 NOTE — Therapy (Signed)
OUTPATIENT PHYSICAL THERAPY TREATMENT NOTE   Patient Name: Christina [name] MRN: [id number] DOB:April 08, 1966, 56 y.o., female Today's Date: 05/28/2022  PCP: Maurice Small, MD REFERRING PROVIDER: Leandrew Koyanagi, MD  END OF SESSION:   PT End of Session - 05/28/22 0819     Visit Number 6    Number of Visits 20    Date for PT Re-Evaluation 06/30/22    PT Start Time 0811    PT Stop Time 0859    PT Time Calculation (min) 48 min    Activity Tolerance Patient tolerated treatment well    Behavior During Therapy [facility] for tasks assessed/performed                 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,  multifocal IDC, DCIS, ER/PR+,  01-09-2016 s/p bilteral mastectomies w/ left sln bx;  no chemoradiation   Cataracts, bilateral    Depression    Diabetes mellitus without complication ([place])    type 2   GAD (generalized anxiety disorder)    Gallbladder problem    History of ovarian cyst    IBS (irritable bowel syndrome)    Joint pain    PONV (postoperative nausea and vomiting)    does well with scop patch   Retinal detachment    Rheg OS   Right knee meniscal tear    Urgency of urination    Past Surgical History:  Procedure Laterality Date   ABDOMINAL HYSTERECTOMY  05/1996   endometriosis   ACHILLES TENDON REPAIR Right 2010;  revision 2011   BLADDER SUSPENSION  2000   BREAST BIOPSY Left 10/2015   BREAST IMPLANT REMOVAL Bilateral 08/12/2017   Procedure: REMOVAL BILATERAL BREAST IMPLANTS;  Surgeon: Wallace Going, DO;  Location: [place];  Service: Plastics;  Laterality: Bilateral;   BREAST RECONSTRUCTION WITH PLACEMENT OF TISSUE EXPANDER AND FLEX HD (ACELLULAR HYDRATED DERMIS) Bilateral 01/09/2016   BREAST RECONSTRUCTION WITH PLACEMENT OF TISSUE EXPANDER AND FLEX HD (ACELLULAR HYDRATED DERMIS) Bilateral 01/09/2016   Procedure: BREAST  RECONSTRUCTION WITH PLACEMENT OF TISSUE EXPANDER AND FLEX HD (ACELLULAR HYDRATED DERMIS);  Surgeon: Loel Lofty Dillingham, DO;  Location: [place];  Service: Plastics;  Laterality: Bilateral;   BREAST RECONSTRUCTION WITH PLACEMENT OF TISSUE EXPANDER AND FLEX HD (ACELLULAR HYDRATED DERMIS) Bilateral 05/29/2016   Procedure: PLACEMENT OF BILATERAL TISSUE EXPANDER AND FLEX HD (ACELLULAR HYDRATED DERMIS);  Surgeon: Wallace Going, DO;  Location: [place];  Service: Plastics;  Laterality: Bilateral;   BREAST REDUCTION SURGERY Bilateral 11/26/2016   Procedure: BILATERAL BREAST CAPSULE CONTRACTURE RELASE;  Surgeon: Wallace Going, DO;  Location: [place];  Service: Plastics;  Laterality: Bilateral;   [place]; 1989; [place] Right x3   last one 02-01-2019 @ [facility]   EYE SURGERY Left 10/01/2020   Pneumatic retinopexy for repair of rheg RD - Dr. Bernarda Caffey   EYE SURGERY Left 10/04/2020   PPV - Dr. Bernarda Caffey   FAT GRAFTING BILATERAL BREAST  08-09-2018  _0    GAS INSERTION Left 10/04/2020   Procedure: INSERTION OF GAS;  Surgeon: Bernarda Caffey, MD;  Location: [place];  Service: Ophthalmology;  Laterality: Left;   GAS/FLUID EXCHANGE Left 10/04/2020   Procedure: GAS/FLUID EXCHANGE;  Surgeon: Bernarda Caffey, MD;  Location: [place];  Service: Ophthalmology;  Laterality: Left;   INCISION AND DRAINAGE OF WOUND Bilateral 02/11/2016   Procedure: IRRIGATION AND DEBRIDEMENT OF BILATERAL BREAST POCKET;  Surgeon: Wallace Going, DO;  Location: [place];  Service: Plastics;  Laterality: Bilateral;   KNEE ARTHROSCOPY WITH MEDIAL MENISECTOMY Right 12/20/2019   Procedure: KNEE ARTHROSCOPY WITH MEDIAL MENISECTOMY;  Surgeon: Marchia Bond, MD;  Location: [place];  Service: Orthopedics;  Laterality: Right;   KNEE ARTHROSCOPY WITH MEDIAL MENISECTOMY Right 10/30/2021   Procedure: RIGHT KNEE ARTHROSCOPY WITH PARTIAL MEDIAL  MENISECTOMY SYNOVECTOMY;  Surgeon: Leandrew Koyanagi, MD;  Location: [place];  Service: Orthopedics;  Laterality: Right;   LAPAROSCOPIC APPENDECTOMY  04-07-2011   _0    w/ Excision peritoneal lipoma and lysis adhesions   LAPAROSCOPIC CHOLECYSTECTOMY  ~ [place] Right 10/04/2020   Procedure: LASER RETINOPEXY WITH INDIRECT LASER OPTHALMOSCOPE, RIGHT EYE;  Surgeon: Bernarda Caffey, MD;  Location: [place];  Service: Ophthalmology;  Laterality: Right;   LIPOSUCTION WITH LIPOFILLING Bilateral 11/26/2016   Procedure: LIPOFILLING FOR SYMMETRY;  Surgeon: Wallace Going, DO;  Location: [place];  Service: Plastics;  Laterality: Bilateral;   LIPOSUCTION WITH LIPOFILLING Bilateral 01/21/2017   Procedure: BILATERAL BREAST  LIPOFILLING FOR ASYMMETRY;  Surgeon: Wallace Going, DO;  Location: [place];  Service: Plastics;  Laterality: Bilateral;   LIPOSUCTION WITH LIPOFILLING Bilateral 06/28/2020   Procedure: Lipofilling bilateral breasts for asymmetry;  Surgeon: Wallace Going, DO;  Location: [place];  Service: Plastics;  Laterality: Bilateral;  90 min   MASTECTOMY Bilateral 01/09/2016   NIPPLE SPARING MASTECTOMY/SENTINAL LYMPH NODE BIOPSY/RECONSTRUCTION/PLACEMENT OF TISSUE EXPANDER Bilateral 01/09/2016   Procedure: BILATERAL NIPPLE SPARING MASTECTOMY WITH LEFT SENTINAL LYMPH NODE BIOPSY ;  Surgeon: Stark Klein, MD;  Location: [place];  Service: General;  Laterality: Bilateral;   PHOTOCOAGULATION WITH LASER Left 10/04/2020   Procedure: PHOTOCOAGULATION WITH LASER;  Surgeon: Bernarda Caffey, MD;  Location: [place];  Service: Ophthalmology;  Laterality: Left;   REMOVAL OF BILATERAL TISSUE EXPANDERS WITH PLACEMENT OF BILATERAL BREAST IMPLANTS Bilateral 08/20/2016   Procedure: REMOVAL OF BILATERAL TISSUE EXPANDERS WITH PLACEMENT OF BILATERAL SILICONE IMPLANTS;  Surgeon: Wallace Going, DO;  Location: [place];   Service: Plastics;  Laterality: Bilateral;   REMOVAL OF BILATERAL TISSUE EXPANDERS WITH PLACEMENT OF BILATERAL BREAST IMPLANTS Bilateral 11/05/2017   Procedure: REMOVAL OF BILATERAL TISSUE EXPANDERS WITH PLACEMENT OF BILATERAL BREAST SILICONE IMPLANTS;  Surgeon: Wallace Going, DO;  Location: [place];  Service: Plastics;  Laterality: Bilateral;   REMOVAL OF TISSUE EXPANDER Bilateral 02/11/2016   Procedure: REMOVAL OF BILATERAL TISSUE EXPANDERS AND FLEX HD REMOVAL;  Surgeon: Wallace Going, DO;  Location: [place];  Service: Plastics;  Laterality: Bilateral;   RETINAL DETACHMENT SURGERY Left 10/01/2020   Pneumatic retinopexy for repair of rheg RD - Dr. Bernarda Caffey   RETINAL DETACHMENT SURGERY Left 10/04/2020   PPV - Dr. Bernarda Caffey   SCLERAL BUCKLE Left 10/04/2020   Procedure: SCLERAL BUCKLE LEFT EYE;  Surgeon: Bernarda Caffey, MD;  Location: [place];  Service: Ophthalmology;  Laterality: Left;   TISSUE EXPANDER PLACEMENT Bilateral 08/12/2017   Procedure: PLACEMENT OF BILATERAL TISSUE EXPANDER;  Surgeon: Wallace Going, DO;  Location: [place];  Service: Plastics;  Laterality: Bilateral;   TOTAL KNEE ARTHROPLASTY Right 03/31/2022   Procedure: RIGHT TOTAL KNEE REPLACEMENT;  Surgeon: Leandrew Koyanagi, MD;  Location:  [place] [facility];  Service: Orthopedics;  Laterality: Right;   TUBAL LIGATION Bilateral 1991   VITRECTOMY 25 GAUGE WITH SCLERAL BUCKLE Left 10/04/2020   Procedure: 25 GAUGE PARS PLANA VITRECTOMY LEFT EYE ;  Surgeon: Bernarda Caffey, MD;  Location: [place];  Service: Ophthalmology;  Laterality: Left;   Patient Active Problem List   Diagnosis Date Noted   DVT (deep venous thrombosis) ([place]) 05/11/2022   Acute pulmonary embolism ([place]) 05/09/2022   Chest pain 05/09/2022   Elevated troponin 05/09/2022   Status post total right knee replacement 03/31/2022   Primary osteoarthritis of right knee 03/20/2022   Eating disorder 03/20/2022   Acute  medial meniscus tear, right, initial encounter 10/01/2021   At risk for dehydration 01/28/2021   At risk for malnutrition 01/01/2021   Obesity, Class I, BMI 30-34.9 12/31/2020   Vitamin B12 deficiency 10/08/2020   At risk for depression 10/08/2020   Mood disorder ([place]), with emotional eating 09/24/2020   At risk for impaired metabolic function 74/14/2395   At risk for diabetes mellitus 07/30/2020   Vitamin D deficiency 07/02/2020   Depression 07/02/2020   At risk for side effect of medication 07/02/2020   Stress due to illness of family member-  daughter with drug addiction 05/15/2020   Depression, recurrent ([place]) 05/15/2020   Anemia 05/15/2020   Constipation 05/15/2020   Prediabetes 05/15/2020   Breast asymmetry following reconstructive surgery 04/24/2020   Acquired absence of breast 04/24/2020   S/P breast reconstruction, bilateral 04/24/2020   Acute medial meniscus tear of left knee 12/20/2019   Breast cancer, left ([place]) 01/09/2016   Genetic testing 32/10/3341   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   Family history of breast cancer in female 11/08/2015   Family history of colon cancer 11/08/2015   Malignant neoplasm of central portion of left breast in female, estrogen receptor positive ([place]) 10/31/2015   Endometriosis 03/11/2011    REFERRING DIAG: H68.616 (ICD-10-CM) - Hx of total knee replacement, right  THERAPY DIAG:  Chronic pain of right knee  Muscle weakness (generalized)  Stiffness of right knee, not elsewhere classified  Localized edema  Difficulty in walking, not elsewhere classified  Rationale for Evaluation and Treatment Rehabilitation  PERTINENT HISTORY: Bilateral hands and knees OA, type 2 DM, breast cancer, anxiety, 2010/11 Rt Achilles repair/revision, Rt elbow surgery, 2 previous Rt knee scopes, obesity  PRECAUTIONS: None  SUBJECTIVE: Pt indicated 0/10 pain in knee upon arrival today.  Still having some swelling.  Pt also indicated feeling up  to 8/10 in knee at times with walking.   Pt indicated late today due to flat tire.   PAIN:  NPRS scale: 0/10, ranged 0-8/10 since last appointment Pain location: Medial Rt knee Pain description: Achy, stiff Aggravating factors: Prolonged postures, prolonged WB Relieving factors: Ice and tylenol   OBJECTIVE:  PATIENT SURVEYS:  05/28/2022: FOTO update:  58  04/21/2022 FOTO intake: 35  predicted:  63   COGNITION: 04/21/2022 Overall cognitive status: WFL              EDEMA:  04/21/2022 Visible edema noted in knee lower leg and ankle on Rt.    MUSCLE LENGTH: 04/21/2022 None performed    PALPATION: 04/21/2022 Tenderness surrounding incision and knee jt line Rt   LOWER EXTREMITY ROM:   ROM Right 04/21/2022 Left 04/21/2022 Right 04/23/2022 Right 05/02/22 Right 05/28/2022  Hip flexion         Hip extension         Hip abduction  Hip adduction         Hip internal rotation         Hip external rotation         Knee flexion 100 in supine heel slide AROM   108 supine AROM 102 seated AROM 108 supine AROM Supine heel slide AROM: 114  Knee extension -10 seated LAQ AROM   -3 supine AROM -10 seated LAQ AROM -4 supine leg lift AROM   Ankle dorsiflexion         Ankle plantarflexion         Ankle inversion         Ankle eversion          (Blank rows = not tested)   LOWER EXTREMITY MMT:   MMT Right 04/21/2022 Left 04/21/2022 Right 05/28/2022  Hip flexion 5/5 5/5   Hip extension       Hip abduction       Hip adduction       Hip internal rotation       Hip external rotation       Knee flexion 4/5 5/5 5/5  Knee extension 3+/5 5/5 4/5  Ankle dorsiflexion 5/5 5/5   Ankle plantarflexion       Ankle inversion       Ankle eversion        (Blank rows = not tested)   LOWER EXTREMITY SPECIAL TESTS:  04/21/2022 Lt SLS 5 seconds.  Rt SLS unable assisted   FUNCTIONAL TESTS:  04/21/2022 Sit to stand to sit from 18 inch table height s UE assist c deviation to Lt leg in movement.     GAIT: 05/28/2022:   independent ambulation   04/21/2022 Ambulation c SPC in Lt UE.  Lateral trunk lean to Lt in stance, reduced stance on Rt leg, maintained knee flexion in stance. Antalgic gait noted.        TODAY'S TREATMENT: 05/28/2022 Therex: Nustep lvl 6 8 mins UE/LE Supine LAQ for strength/edema reduction 2 x 10 Rt Seated LAQ c overpressure flexion stretch 10 sec hold x 10 Rt Leg press Rt leg only 2 x 15   Supine heel slide c SLR x 10 Rt leg Supine Rt leg hamstring stretch with strap 15 sec hold x 5  Neuro Re-ed: SLS vector reaching contralateral leg light touch fwd/lateral/reverse x 8, WB on Rt leg Tandem Stance 1 min x 1 bilateral  Vasopneumatic compression Rt ankle/foot 10 mins 34 deg medium compression in elevation.    05/26/22 NuStep lvl 6, 10 min for increased mobility.   Retrograde massage for edema LE elevated  STM to R quad  Ankle pumps in elevated position x30   Vasocompression to ankle and knee for 15 min due to significant swelling in L LE.   05/07/22  Scifit bike for ROM seat 11--> seat 9 full rotations  Retrograde massage for edema LE elevated  STM to R quad   Overpressure into extension and flexion x10 each   Tandem stance 3x30 seconds B  Narrow BOS eyes shut 30 seconds x3  Box taps while standing on blue foam pad x30   Ongoing education about edema management, use of vaso, elevation, etc   05/02/2022 TherEx: Recumbent bike Seat 6 for 8 minutes level 1 full AROM Leg Press 50# Rt leg only 2 sets of 10 slow eccentrics, some increased knee pain Seated straight leg raise x 10 reps, added to HEP STS with cueing for eccentric focus from 22" mat height with intermittent UE  use x 6, added to HEP  Neuro Re-ed: Standing heel toe raises with no UE support x 10, reduced control with lowering Tandem stance 2 x 30s bil., few instances of single UE support to regain balance Foam normal BOS x 15s no UE Foam narrow BOS x 30s no UE, increased postural  sway  TherAct: PT education on use of cane for balance and WB support outside of home, pt verbalized understanding  Self Care: PT educated on indication and options for elevation of RLE throughout day to manage swelling, pt verbalized understanding    PATIENT EDUCATION:  04/21/2022 Education details: HEP, POC Person educated: Patient Education method: Consulting civil engineer, Demonstration, Verbal cues, and Handouts Education comprehension: verbalized understanding, returned demonstration, and verbal cues required    HOME EXERCISE PROGRAM: Access Code: [email or web address] URL: [email or web address] Date: 05/02/2022 Prepared by: Scot Jun  Exercises - Supine Heel Slide  - 2 x daily - 7 x weekly - 3 sets - 10 reps - 2 hold - Seated Long Arc Quad  - 2 x daily - 7 x weekly - 3 sets - 10 reps - 2 hold - Supine Knee Extension Mobilization with Weight  - 2-4 x daily - 7 x weekly - 1 sets - 1 reps - to tolerance up to 15 mins hold - Seated Knee Flexion Extension AAROM with Overpressure  - 2-3 x daily - 7 x weekly - 1 sets - 10 reps - 5 hold - Seated Passive Knee Extension with Weight  - 2-4 x daily - 7 x weekly - 1 sets - 1 reps - to tolerance hold - Sit to Stand  - 1-2 x daily - 7 x weekly - 1-2 sets - 10 reps - Seated Straight Leg Heel Taps  - 1-2 x daily - 7 x weekly - 1-2 sets - 10 reps   ASSESSMENT:   CLINICAL IMPRESSION: Fair control in balance intervention but improving overall.  Continued independent gait pattern into clinic at this time.  Swelling in Rt leg throughout still noted.  Strength assessment showed improvement but continued gains still necessary to improve functional movement patterns.    OBJECTIVE IMPAIRMENTS Abnormal gait, decreased activity tolerance, decreased balance, decreased coordination, decreased endurance, decreased mobility, difficulty walking, decreased ROM, decreased strength, hypomobility, increased edema, increased fascial restrictions, impaired perceived  functional ability, increased muscle spasms, impaired flexibility, improper body mechanics, and pain.    ACTIVITY LIMITATIONS carrying, lifting, bending, sitting, standing, squatting, sleeping, stairs, transfers, bed mobility, bathing, toileting, dressing, reach over head, and locomotion level   PARTICIPATION LIMITATIONS: meal prep, cleaning, laundry, interpersonal relationship, driving, shopping, community activity, and occupation   PERSONAL FACTORS 3+ comorbidities: Bilateral hands and knees OA, type 2 DM, breast cancer, anxiety, 2010/11 Rt Achilles repair/revision, Rt elbow surgery, 2 previous Rt knee scopes, obesity  are also affecting patient's functional outcome.    REHAB POTENTIAL: Good   CLINICAL DECISION MAKING: Stable/uncomplicated   EVALUATION COMPLEXITY: Low     GOALS: Goals reviewed with patient? Yes   Short term PT Goals (target date for Short term goals are 3 weeks 05/12/2022) Patient will demonstrate independent use of home exercise program to maintain progress from in clinic treatments. Goal status:Met   Long term PT goals (target dates for all long term goals are 10 weeks  06/30/2022 )   1. Patient will demonstrate/report pain at worst less than or equal to 2/10 to facilitate minimal limitation in daily activity secondary to pain symptoms. Goal status: on going - assessed 05/28/2022  2. Patient will demonstrate independent use of home exercise program to facilitate ability to maintain/progress functional gains from skilled physical therapy services. Goal status: on going - assessed 05/28/2022   3. Patient will demonstrate FOTO outcome > or = 63 % to indicate reduced disability due to condition. Goal status: on going - assessed 05/28/2022   4.  Patient will demonstrate Rt LE MMT 5/5 throughout to faciltiate usual transfers, stairs, squatting at [facility] for daily life.    Goal status: on going - assessed 05/28/2022   5. Patient will demonstrate Rt knee AROM 0-110 degrees  to facilitate ability to perform transfers, sitting, ambulation, stair navigation s restriction due to mobility.   Goal status: on going - assessed 05/28/2022   6.  Patient will demonstrate independent ambulation community distances > 300 ft.   Goal status: MET- assessed 05/28/2022   7.  Patient will demonstrate/report ability to ascend/descend stairs c reciprocal gait pattern s UE assist for community integration.  a.  Goal Status: on going - assessed 05/28/2022       PLAN:   PT FREQUENCY: 2x/week   PT DURATION: 10 weeks   PLANNED INTERVENTIONS: Therapeutic exercises, Therapeutic activity, Neuro Muscular re-education, Balance training, Gait training, Patient/Family education, Joint mobilization, Stair training, DME instructions, Dry Needling, Electrical stimulation, Cryotherapy, Moist heat, Taping, Ultrasound, Ionotophoresis 34m/ml Dexamethasone, and Manual therapy.  All included unless contraindicated     PLAN FOR NEXT SESSION: Progressive strengthening and balance.  Vaso for swelling control.   MScot Jun PT, DPT, OCS, ATC 05/28/22  8:48 AM

## 2022-05-31 NOTE — Progress Notes (Signed)
Patient Care Team: Christina Small, MD as PCP - General (Family Medicine) Christina Salon, MD as Consulting Physician (Gynecology) Christina Cheese, NP as Nurse Practitioner (Hematology and Oncology)  DIAGNOSIS: No diagnosis found.  SUMMARY OF ONCOLOGIC HISTORY: Oncology History  Malignant neoplasm of central portion of left breast in female, estrogen receptor positive ([place])  10/29/2015 Mammogram   Screening detected left breast calcifications of irregular 11 mm biopsy DCIS high-grade, central calcifications biopsy was fibrocystic change   10/29/2015 Initial Diagnosis   Left breast biopsy subareolar: DCIS with calcifications, ER 95%, PR 90%, high-grade   11/19/2015 Procedure   Genetic testing: ATM gene mutation, "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 p(m)T1b, pN0: Stage IA; RIGHT mastectomy: PASH    01/09/2016 Oncotype testing   Oncotype score: 5; 5% ROR   02/2016 -  Anti-estrogen oral therapy   Tamoxifen 20 mg daily.      CHIEF COMPLIANT: Follow-up breat cancer on tamoxifen  INTERVAL HISTORY: Christina [name] is a 56 y.o. with the above mentioned. She presents to the clinic for a follow-up.     ALLERGIES:  has No Known Allergies.  MEDICATIONS:  Current Outpatient Medications  Medication Sig Dispense Refill   amitriptyline (ELAVIL) 150 MG tablet Take 150 mg by mouth at bedtime.     buPROPion (WELLBUTRIN XL) 300 MG 24 hr tablet Take 300 mg by mouth every morning.     cycloSPORINE (RESTASIS) 0.05 % ophthalmic emulsion Place 1 drop into both eyes 2 (two) times daily.     methocarbamol  (ROBAXIN-750) 750 MG tablet Take 1 tablet (750 mg total) by mouth 2 (two) times daily as needed. (Patient taking differently: Take 750 mg by mouth 2 (two) times daily as needed for muscle spasms.) 20 tablet 2   REXULTI 2 MG TABS tablet Take 2 mg by mouth at bedtime.     RIVAROXABAN (XARELTO) VTE STARTER PACK (15 & 20 MG) Follow package directions: Take one 70m tablet by mouth twice a day. On day 22, switch to one 22mtablet once a day. Take with food. 51 each 0   tamoxifen (NOLVADEX) 20 MG tablet Take 1 tablet (20 mg total) by mouth daily. 90 tablet 3   Vitamin D, Ergocalciferol, (DRISDOL) 1.25 MG (50000 UNIT) CAPS capsule 1 po q wed and 1 po q sun (Patient taking differently: Take 50,000 Units by mouth every 7 (seven) days. Every Sunday) 8 capsule 0   No current facility-administered medications for this visit.    PHYSICAL EXAMINATION: ECOG PERFORMANCE STATUS: {CHL ONC ECOG PS:[id number]}  There were no vitals filed for this visit. There were no vitals filed for this visit.  BREAST:*** No palpable masses or nodules in either right or left breasts. No palpable axillary supraclavicular or infraclavicular adenopathy no breast tenderness or nipple discharge. (exam performed in the presence of a chaperone)  LABORATORY DATA:  I have reviewed the data as listed    Latest Ref Rng & Units 05/10/2022    2:34 AM 05/09/2022   11:20 AM 05/08/2022   11:55 PM  CMP  Glucose 70 - 99 mg/dL 105  106  140   BUN 6 - 20 mg/dL _0 Creatinine 0.44 - 1.00 mg/dL 0.78  0.82  0.97   Sodium 135 - 145 mmol/L 141  141  140   Potassium 3.5 - 5.1 mmol/L 4.1  3.8  4.0   Chloride 98 - 111 mmol/L 114  113  112   CO2 22 - 32 mmol/L _1 Calcium 8.9 - 10.3 mg/dL 9.9  9.6  10.0   Total Protein 6.5 - 8.1 g/dL 6.8  6.7  7.0   Total Bilirubin 0.3 - 1.2 mg/dL 0.5  0.6  0.6   Alkaline Phos 38 - 126 U/L 49  51  59   AST 15 - 41 U/L _2 ALT 0 - 44 U/L _3 Lab Results  Component Value  Date   WBC 7.0 05/12/2022   HGB 11.4 (L) 05/12/2022   HCT 37.8 05/12/2022   MCV 85.1 05/12/2022   PLT 279 05/12/2022   NEUTROABS 4.4 05/08/2022    ASSESSMENT & PLAN:  No problem-specific Assessment & Plan notes found for this encounter.    No orders of the defined types were placed in this encounter.  The patient has a good understanding of the overall plan. she agrees with it. she will call with any problems that may develop before the next visit here. Total time spent: 30 mins including face to face time and time spent for planning, charting and co-ordination of care   Suzzette Righter, [place] 05/31/22    I Gardiner Coins am scribing for Dr. Lindi Adie  ***

## 2022-06-03 ENCOUNTER — Encounter: Payer: Self-pay | Admitting: *Deleted

## 2022-06-03 ENCOUNTER — Inpatient Hospital Stay: Payer: Commercial Managed Care - HMO | Attending: Hematology and Oncology | Admitting: Hematology and Oncology

## 2022-06-03 ENCOUNTER — Other Ambulatory Visit: Payer: Self-pay

## 2022-06-03 DIAGNOSIS — C50112 Malignant neoplasm of central portion of left female breast: Secondary | ICD-10-CM | POA: Diagnosis present

## 2022-06-03 DIAGNOSIS — Z9011 Acquired absence of right breast and nipple: Secondary | ICD-10-CM | POA: Insufficient documentation

## 2022-06-03 DIAGNOSIS — Z170 Estrogen receptor positive status [ER+]: Secondary | ICD-10-CM | POA: Insufficient documentation

## 2022-06-03 DIAGNOSIS — Z79899 Other long term (current) drug therapy: Secondary | ICD-10-CM | POA: Insufficient documentation

## 2022-06-03 DIAGNOSIS — Z86711 Personal history of pulmonary embolism: Secondary | ICD-10-CM | POA: Diagnosis not present

## 2022-06-03 DIAGNOSIS — Z86718 Personal history of other venous thrombosis and embolism: Secondary | ICD-10-CM | POA: Insufficient documentation

## 2022-06-03 DIAGNOSIS — Z7901 Long term (current) use of anticoagulants: Secondary | ICD-10-CM | POA: Insufficient documentation

## 2022-06-03 DIAGNOSIS — Z9013 Acquired absence of bilateral breasts and nipples: Secondary | ICD-10-CM | POA: Insufficient documentation

## 2022-06-03 NOTE — Assessment & Plan Note (Addendum)
Left mastectomy 01/09/2016: Multifocal IDC 0.6cm (grade 2), 0.4, 0.3 cm (grade 1), IG-DCIS and sep focus of HG DCIS; right mastectomy:PASH, 0/4 Lt Ax[place] LN Neg ER/PR HER-2 are being retested Previously E 95%, PR 90% ATM Mutation (risk of breast, colon, pancreatic cancers) Oncotype DX score 5: 5% risk of recurrence with tamoxifen  Current treatment:Tamoxifen 20 mg daily started June 2017-06/03/2022  Tamoxifen toxicities: 1.  Acute PE and right leg DVT 05/08/2022: Hospitalization: Bilateral pulmonary emboli (patient presented with chest pain and leg swelling): Discontinue tamoxifen therapy at this time 2.Hot flashes have gone away.   Denies any myalgias(patient had a prior hysterectomy) She has arthritis in her right hipimproved with cortisone injection.  Surveillance: No role of mammograms since she had bil mastectomies (bilateral reconstructed breasts without any palpable lumps or nodules) Chest exam:  06/03/2022: No palpable lumps in chest wall or axilla.  Return to clinic in1 yearfor follow-up andbreast exams and surveillance

## 2022-06-05 ENCOUNTER — Encounter: Payer: Self-pay | Admitting: Rehabilitative and Restorative Service Providers"

## 2022-06-05 ENCOUNTER — Ambulatory Visit (INDEPENDENT_AMBULATORY_CARE_PROVIDER_SITE_OTHER): Payer: Commercial Managed Care - HMO | Admitting: Rehabilitative and Restorative Service Providers"

## 2022-06-05 DIAGNOSIS — R262 Difficulty in walking, not elsewhere classified: Secondary | ICD-10-CM

## 2022-06-05 DIAGNOSIS — M25661 Stiffness of right knee, not elsewhere classified: Secondary | ICD-10-CM

## 2022-06-05 DIAGNOSIS — R600 Localized edema: Secondary | ICD-10-CM

## 2022-06-05 DIAGNOSIS — M6281 Muscle weakness (generalized): Secondary | ICD-10-CM | POA: Diagnosis not present

## 2022-06-05 NOTE — Therapy (Addendum)
OUTPATIENT PHYSICAL THERAPY TREATMENT NOTE [place]   Patient Name: Christina [name] MRN: [id number] DOB:[birth year], 56 y.o., female Today's Date: 06/05/2022  PCP: Maurice Small, MD REFERRING PROVIDER: Leandrew Koyanagi, MD  END OF SESSION:   PT End of Session - 06/05/22 0801     Visit Number 7    Number of Visits 20    Date for PT Re-Evaluation 06/30/22    PT Start Time 0759    PT Stop Time 0854    PT Time Calculation (min) 55 min    Activity Tolerance Patient tolerated treatment well    Behavior During Therapy [facility] for tasks assessed/performed              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,  multifocal IDC, DCIS, ER/PR+,  01-09-2016 s/p bilteral mastectomies w/ left sln bx;  no chemoradiation   Cataracts, bilateral    Depression    Diabetes mellitus without complication ([place])    type 2   GAD (generalized anxiety disorder)    Gallbladder problem    History of ovarian cyst    IBS (irritable bowel syndrome)    Joint pain    PONV (postoperative nausea and vomiting)    does well with scop patch   Retinal detachment    Rheg OS   Right knee meniscal tear    Urgency of urination    Past Surgical History:  Procedure Laterality Date   ABDOMINAL HYSTERECTOMY  05/1996   endometriosis   ACHILLES TENDON REPAIR Right 2010;  revision 2011   BLADDER SUSPENSION  2000   BREAST BIOPSY Left 10/2015   BREAST IMPLANT REMOVAL Bilateral 08/12/2017   Procedure: REMOVAL BILATERAL BREAST IMPLANTS;  Surgeon: Wallace Going, DO;  Location: [place];  Service: Plastics;  Laterality: Bilateral;   BREAST RECONSTRUCTION WITH PLACEMENT OF TISSUE EXPANDER AND FLEX HD (ACELLULAR HYDRATED DERMIS) Bilateral 01/09/2016   BREAST RECONSTRUCTION WITH PLACEMENT OF TISSUE EXPANDER AND FLEX HD (ACELLULAR HYDRATED DERMIS) Bilateral 01/09/2016   Procedure:  BREAST RECONSTRUCTION WITH PLACEMENT OF TISSUE EXPANDER AND FLEX HD (ACELLULAR HYDRATED DERMIS);  Surgeon: Loel Lofty Dillingham, DO;  Location: [place];  Service: Plastics;  Laterality: Bilateral;   BREAST RECONSTRUCTION WITH PLACEMENT OF TISSUE EXPANDER AND FLEX HD (ACELLULAR HYDRATED DERMIS) Bilateral 05/29/2016   Procedure: PLACEMENT OF BILATERAL TISSUE EXPANDER AND FLEX HD (ACELLULAR HYDRATED DERMIS);  Surgeon: Wallace Going, DO;  Location: [place];  Service: Plastics;  Laterality: Bilateral;   BREAST REDUCTION SURGERY Bilateral 11/26/2016   Procedure: BILATERAL BREAST CAPSULE CONTRACTURE RELASE;  Surgeon: Wallace Going, DO;  Location: [place];  Service: Plastics;  Laterality: Bilateral;   [place]; 1989; [place] Right x3   last one 02-01-2019 @ [facility]   EYE SURGERY Left 10/01/2020   Pneumatic retinopexy for repair of rheg RD - Dr. Bernarda Caffey   EYE SURGERY Left 10/04/2020   PPV - Dr. Bernarda Caffey   FAT GRAFTING BILATERAL BREAST  08-09-2018  $RemoveBef'@[facility]'kErxyqXwHz$    GAS INSERTION Left 10/04/2020   Procedure: INSERTION OF GAS;  Surgeon: Bernarda Caffey, MD;  Location: [place];  Service: Ophthalmology;  Laterality: Left;   GAS/FLUID EXCHANGE Left 10/04/2020   Procedure: GAS/FLUID EXCHANGE;  Surgeon: Bernarda Caffey, MD;  Location: [place];  Service: Ophthalmology;  Laterality: Left;   INCISION AND DRAINAGE OF WOUND Bilateral 02/11/2016   Procedure: IRRIGATION AND DEBRIDEMENT OF BILATERAL BREAST POCKET;  Surgeon: Wallace Going, DO;  Location: [place];  Service: Plastics;  Laterality: Bilateral;   KNEE ARTHROSCOPY WITH MEDIAL MENISECTOMY Right 12/20/2019   Procedure: KNEE ARTHROSCOPY WITH MEDIAL MENISECTOMY;  Surgeon: Marchia Bond, MD;  Location: [place];  Service: Orthopedics;  Laterality: Right;   KNEE ARTHROSCOPY WITH MEDIAL MENISECTOMY Right 10/30/2021   Procedure: RIGHT KNEE ARTHROSCOPY WITH PARTIAL MEDIAL  MENISECTOMY SYNOVECTOMY;  Surgeon: Leandrew Koyanagi, MD;  Location: [place];  Service: Orthopedics;  Laterality: Right;   LAPAROSCOPIC APPENDECTOMY  04-07-2011   _0    w/ Excision peritoneal lipoma and lysis adhesions   LAPAROSCOPIC CHOLECYSTECTOMY  ~ [place] Right 10/04/2020   Procedure: LASER RETINOPEXY WITH INDIRECT LASER OPTHALMOSCOPE, RIGHT EYE;  Surgeon: Bernarda Caffey, MD;  Location: [place];  Service: Ophthalmology;  Laterality: Right;   LIPOSUCTION WITH LIPOFILLING Bilateral 11/26/2016   Procedure: LIPOFILLING FOR SYMMETRY;  Surgeon: Wallace Going, DO;  Location: [place];  Service: Plastics;  Laterality: Bilateral;   LIPOSUCTION WITH LIPOFILLING Bilateral 01/21/2017   Procedure: BILATERAL BREAST  LIPOFILLING FOR ASYMMETRY;  Surgeon: Wallace Going, DO;  Location: [place];  Service: Plastics;  Laterality: Bilateral;   LIPOSUCTION WITH LIPOFILLING Bilateral 06/28/2020   Procedure: Lipofilling bilateral breasts for asymmetry;  Surgeon: Wallace Going, DO;  Location: [place];  Service: Plastics;  Laterality: Bilateral;  90 min   MASTECTOMY Bilateral 01/09/2016   NIPPLE SPARING MASTECTOMY/SENTINAL LYMPH NODE BIOPSY/RECONSTRUCTION/PLACEMENT OF TISSUE EXPANDER Bilateral 01/09/2016   Procedure: BILATERAL NIPPLE SPARING MASTECTOMY WITH LEFT SENTINAL LYMPH NODE BIOPSY ;  Surgeon: Stark Klein, MD;  Location: [place];  Service: General;  Laterality: Bilateral;   PHOTOCOAGULATION WITH LASER Left 10/04/2020   Procedure: PHOTOCOAGULATION WITH LASER;  Surgeon: Bernarda Caffey, MD;  Location: [place];  Service: Ophthalmology;  Laterality: Left;   REMOVAL OF BILATERAL TISSUE EXPANDERS WITH PLACEMENT OF BILATERAL BREAST IMPLANTS Bilateral 08/20/2016   Procedure: REMOVAL OF BILATERAL TISSUE EXPANDERS WITH PLACEMENT OF BILATERAL SILICONE IMPLANTS;  Surgeon: Wallace Going, DO;  Location: [place];   Service: Plastics;  Laterality: Bilateral;   REMOVAL OF BILATERAL TISSUE EXPANDERS WITH PLACEMENT OF BILATERAL BREAST IMPLANTS Bilateral 11/05/2017   Procedure: REMOVAL OF BILATERAL TISSUE EXPANDERS WITH PLACEMENT OF BILATERAL BREAST SILICONE IMPLANTS;  Surgeon: Wallace Going, DO;  Location: [place];  Service: Plastics;  Laterality: Bilateral;   REMOVAL OF TISSUE EXPANDER Bilateral 02/11/2016   Procedure: REMOVAL OF BILATERAL TISSUE EXPANDERS AND FLEX HD REMOVAL;  Surgeon: Wallace Going, DO;  Location: [place];  Service: Plastics;  Laterality: Bilateral;   RETINAL DETACHMENT SURGERY Left 10/01/2020   Pneumatic retinopexy for repair of rheg RD - Dr. Bernarda Caffey   RETINAL DETACHMENT SURGERY Left 10/04/2020   PPV - Dr. Bernarda Caffey   SCLERAL BUCKLE Left 10/04/2020   Procedure: SCLERAL BUCKLE LEFT EYE;  Surgeon: Bernarda Caffey, MD;  Location: [place];  Service: Ophthalmology;  Laterality: Left;   TISSUE EXPANDER PLACEMENT Bilateral 08/12/2017   Procedure: PLACEMENT OF BILATERAL TISSUE EXPANDER;  Surgeon: Wallace Going, DO;  Location: [place];  Service: Plastics;  Laterality: Bilateral;   TOTAL KNEE ARTHROPLASTY Right 03/31/2022   Procedure: RIGHT TOTAL KNEE REPLACEMENT;  Surgeon: Leandrew Koyanagi, MD;  Location: [place];  Service: Orthopedics;  Laterality: Right;   TUBAL LIGATION Bilateral 1991   VITRECTOMY 25 GAUGE WITH SCLERAL BUCKLE Left 10/04/2020   Procedure: 25 GAUGE PARS PLANA VITRECTOMY LEFT EYE ;  Surgeon: Bernarda Caffey, MD;  Location: [place];  Service: Ophthalmology;  Laterality: Left;   Patient Active Problem List   Diagnosis Date Noted   DVT (deep venous thrombosis) ([place]) 05/11/2022   Acute pulmonary embolism ([place]) 05/09/2022   Chest pain 05/09/2022   Elevated troponin 05/09/2022   Status post total right knee replacement 03/31/2022   Primary osteoarthritis of right knee 03/20/2022   Eating disorder 03/20/2022   Acute  medial meniscus tear, right, initial encounter 10/01/2021   At risk for dehydration 01/28/2021   At risk for malnutrition 01/01/2021   Obesity, Class I, BMI 30-34.9 12/31/2020   Vitamin B12 deficiency 10/08/2020   At risk for depression 10/08/2020   Mood disorder ([place]), with emotional eating 09/24/2020   At risk for impaired metabolic function 41/74/0814   At risk for diabetes mellitus 07/30/2020   Vitamin D deficiency 07/02/2020   Depression 07/02/2020   At risk for side effect of medication 07/02/2020   Stress due to illness of family member-  daughter with drug addiction 05/15/2020   Depression, recurrent ([place]) 05/15/2020   Anemia 05/15/2020   Constipation 05/15/2020   Prediabetes 05/15/2020   Breast asymmetry following reconstructive surgery 04/24/2020   Acquired absence of breast 04/24/2020   S/P breast reconstruction, bilateral 04/24/2020   Acute medial meniscus tear of left knee 12/20/2019   Breast cancer, left ([place]) 01/09/2016   Genetic testing 48/18/5631   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   Family history of breast cancer in female 11/08/2015   Family history of colon cancer 11/08/2015   Malignant neoplasm of central portion of left breast in female, estrogen receptor positive ([place]) 10/31/2015   Endometriosis 03/11/2011    REFERRING DIAG: S97.026 (ICD-10-CM) - Hx of total knee replacement, right  THERAPY DIAG:  Muscle weakness (generalized)  Stiffness of right knee, not elsewhere classified  Localized edema  Difficulty in walking, not elsewhere classified  Rationale for Evaluation and Treatment Rehabilitation  PERTINENT HISTORY: Bilateral hands and knees OA, type 2 DM, breast cancer, anxiety, 2010/11 Rt Achilles repair/revision, Rt elbow surgery, 2 previous Rt knee scopes, obesity  PRECAUTIONS: None  SUBJECTIVE: Christina [name] has been back at work since 3 weeks post-surgery.  She is sitting at work and would normally be standing.  Still some pain with WB on  the medial knee.  PAIN:  NPRS scale: 0-4/10 this week Pain location: Medial Rt knee Pain description: Achy, stiff Aggravating factors: Prolonged postures, prolonged WB Relieving factors: Ice and tylenol (rarely)   OBJECTIVE:  PATIENT SURVEYS:  05/28/2022: FOTO update:  58  04/21/2022 FOTO intake: 35  predicted:  63   COGNITION: 04/21/2022 Overall cognitive status: WFL              EDEMA:  04/21/2022 Visible edema noted in knee lower leg and ankle on Rt.    MUSCLE LENGTH: 04/21/2022 None performed    PALPATION: 04/21/2022 Tenderness surrounding incision and knee jt line Rt   LOWER EXTREMITY ROM:   ROM Right 04/21/2022 Left 04/21/2022 Right 04/23/2022 Right 05/02/22 Right 05/28/2022 Right 06/05/2022  Hip flexion          Hip extension          Hip abduction          Hip adduction  Hip internal rotation          Hip external rotation          Knee flexion 100 in supine heel slide AROM   108 supine AROM 102 seated AROM 108 supine AROM Supine heel slide AROM: 114 AROM 112  Knee extension -10 seated LAQ AROM   -3 supine AROM -10 seated LAQ AROM -4 supine leg lift AROM  AROM -2  Ankle dorsiflexion          Ankle plantarflexion          Ankle inversion          Ankle eversion           (Blank rows = not tested)   LOWER EXTREMITY MMT:   MMT Right 04/21/2022 Left 04/21/2022 Right 05/28/2022  Hip flexion 5/5 5/5   Hip extension       Hip abduction       Hip adduction       Hip internal rotation       Hip external rotation       Knee flexion 4/5 5/5 5/5  Knee extension 3+/5 5/5 4/5  Ankle dorsiflexion 5/5 5/5   Ankle plantarflexion       Ankle inversion       Ankle eversion        (Blank rows = not tested)   LOWER EXTREMITY SPECIAL TESTS:  04/21/2022 Lt SLS 5 seconds.  Rt SLS unable assisted   FUNCTIONAL TESTS:  04/21/2022 Sit to stand to sit from 18 inch table height s UE assist c deviation to Lt leg in movement.    GAIT: 05/28/2022:   independent  ambulation   04/21/2022 Ambulation c SPC in Lt UE.  Lateral trunk lean to Lt in stance, reduced stance on Rt leg, maintained knee flexion in stance. Antalgic gait noted.        TODAY'S TREATMENT: 06/05/2022 Recumbent bike Seat 8 and Seat 7 for 4 minutes each Multi angle Isometrics 90 and 40 degrees of knee flexion 10X 5 seconds each Seated straight leg raises 3 sets of 5 with 1# slow eccentrics  Functional Activities for sit to stand and stairs Leg Press Rt leg only 2 sets of 15 75# slow eccentrics Step-down off 4 inch step (use L hand and slow eccentrics) Step-up and over 6 inch step 10X slow eccentrics  Vaso R knee 10 minutes 34* Medium Pressure   05/28/2022 Therex: Nustep lvl 6 8 mins UE/LE Supine LAQ for strength/edema reduction 2 x 10 Rt Seated LAQ c overpressure flexion stretch 10 sec hold x 10 Rt Leg press Rt leg only 2 x 15   Supine heel slide c SLR x 10 Rt leg Supine Rt leg hamstring stretch with strap 15 sec hold x 5  Neuro Re-ed: SLS vector reaching contralateral leg light touch fwd/lateral/reverse x 8, WB on Rt leg Tandem Stance 1 min x 1 bilateral  Vasopneumatic compression Rt ankle/foot 10 mins 34 deg medium compression in elevation.    05/26/22 NuStep lvl 6, 10 min for increased mobility.   Retrograde massage for edema LE elevated  STM to R quad  Ankle pumps in elevated position x30   Vasocompression to ankle and knee for 15 min due to significant swelling in L LE.     PATIENT EDUCATION:  04/21/2022 Education details: HEP, POC Person educated: Patient Education method: Explanation, Demonstration, Verbal cues, and Handouts Education comprehension: verbalized understanding, returned demonstration, and verbal cues required  HOME EXERCISE PROGRAM: Access Code: [email or web address] URL: [email or web address] Date: 05/02/2022 Prepared by: Scot Jun  Exercises - Supine Heel Slide  - 2 x daily - 7 x weekly - 3 sets - 10 reps - 2 hold - Seated  Long Arc Quad  - 2 x daily - 7 x weekly - 3 sets - 10 reps - 2 hold - Supine Knee Extension Mobilization with Weight  - 2-4 x daily - 7 x weekly - 1 sets - 1 reps - to tolerance up to 15 mins hold - Seated Knee Flexion Extension AAROM with Overpressure  - 2-3 x daily - 7 x weekly - 1 sets - 10 reps - 5 hold - Seated Passive Knee Extension with Weight  - 2-4 x daily - 7 x weekly - 1 sets - 1 reps - to tolerance hold - Sit to Stand  - 1-2 x daily - 7 x weekly - 1-2 sets - 10 reps - Seated Straight Leg Heel Taps  - 1-2 x daily - 7 x weekly - 1-2 sets - 10 reps   ASSESSMENT:   CLINICAL IMPRESSION: Christina [name] is happy with her early progress, although stairs and late day pain/edema are still functionally limiting.  We focused quite a bit on functional quadriceps strength (eccentric emphasis) today to help address these impairments.  She will continue to benefit from functional quadriceps strengthening and proprioceptive work to meet remaining LTGs and to return to her normal work without restrictions/modifications.   OBJECTIVE IMPAIRMENTS Abnormal gait, decreased activity tolerance, decreased balance, decreased coordination, decreased endurance, decreased mobility, difficulty walking, decreased ROM, decreased strength, hypomobility, increased edema, increased fascial restrictions, impaired perceived functional ability, increased muscle spasms, impaired flexibility, improper body mechanics, and pain.    ACTIVITY LIMITATIONS carrying, lifting, bending, sitting, standing, squatting, sleeping, stairs, transfers, bed mobility, bathing, toileting, dressing, reach over head, and locomotion level   PARTICIPATION LIMITATIONS: meal prep, cleaning, laundry, interpersonal relationship, driving, shopping, community activity, and occupation   PERSONAL FACTORS 3+ comorbidities: Bilateral hands and knees OA, type 2 DM, breast cancer, anxiety, 2010/11 Rt Achilles repair/revision, Rt elbow surgery, 2 previous Rt knee scopes,  obesity  are also affecting patient's functional outcome.    REHAB POTENTIAL: Good   CLINICAL DECISION MAKING: Stable/uncomplicated   EVALUATION COMPLEXITY: Low     GOALS: Goals reviewed with patient? Yes   Short term PT Goals (target date for Short term goals are 3 weeks 05/12/2022) Patient will demonstrate independent use of home exercise program to maintain progress from in clinic treatments. Goal status:Met   Long term PT goals (target dates for all long term goals are 10 weeks  06/30/2022 )   1. Patient will demonstrate/report pain at worst less than or equal to 2/10 to facilitate minimal limitation in daily activity secondary to pain symptoms. Goal status: on going - assessed 06/05/2022   2. Patient will demonstrate independent use of home exercise program to facilitate ability to maintain/progress functional gains from skilled physical therapy services. Goal status: on going - assessed 06/05/2022   3. Patient will demonstrate FOTO outcome > or = 63 % to indicate reduced disability due to condition. Goal status: on going - assessed 05/28/2022   4.  Patient will demonstrate Rt LE MMT 5/5 throughout to faciltiate usual transfers, stairs, squatting at [facility] for daily life.    Goal status: on going - assessed 06/05/2022   5. Patient will demonstrate Rt knee AROM 0-110 degrees to facilitate ability to perform transfers, sitting, ambulation, stair navigation s  restriction due to mobility.   Goal status: on going - assessed 06/05/2022   6.  Patient will demonstrate independent ambulation community distances > 300 ft.   Goal status: MET- assessed 05/28/2022   7.  Patient will demonstrate/report ability to ascend/descend stairs c reciprocal gait pattern s UE assist for community integration.  a.  Goal Status: on going - assessed 06/05/2022       PLAN:   PT FREQUENCY: 2x/week   PT DURATION: 10 weeks   PLANNED INTERVENTIONS: Therapeutic exercises, Therapeutic activity, Neuro Muscular  re-education, Balance training, Gait training, Patient/Family education, Joint mobilization, Stair training, DME instructions, Dry Needling, Electrical stimulation, Cryotherapy, Moist heat, Taping, Ultrasound, Ionotophoresis 4mg /ml Dexamethasone, and Manual therapy.  All included unless contraindicated     PLAN FOR NEXT SESSION: Progressive functional, eccentric quadriceps strengthening and balance.  Vaso for swelling control.   Farley Ly PT, MPT 06/05/22  10:46 AM   PHYSICAL THERAPY DISCHARGE SUMMARY  Visits from Start of Care: 7  Current functional level related to goals / functional outcomes: See note   Remaining deficits: See note   Education / Equipment: HEP  Patient goals were partially met. Patient is being discharged due to not returning since the last visit.  Scot Jun, PT, DPT, OCS, ATC 07/04/22  12:20 PM

## 2022-06-17 ENCOUNTER — Encounter: Payer: Self-pay | Admitting: Orthopaedic Surgery

## 2022-06-17 ENCOUNTER — Ambulatory Visit (INDEPENDENT_AMBULATORY_CARE_PROVIDER_SITE_OTHER): Payer: Commercial Managed Care - HMO | Admitting: Physician Assistant

## 2022-06-17 ENCOUNTER — Ambulatory Visit (INDEPENDENT_AMBULATORY_CARE_PROVIDER_SITE_OTHER): Payer: Commercial Managed Care - HMO

## 2022-06-17 DIAGNOSIS — Z96651 Presence of right artificial knee joint: Secondary | ICD-10-CM | POA: Diagnosis not present

## 2022-06-17 MED ORDER — TRAMADOL HCL 50 MG PO TABS: 50.0000 mg | ORAL_TABLET | Freq: Two times a day (BID) | ORAL | 2 refills | Status: DC | PRN

## 2022-06-17 NOTE — Progress Notes (Signed)
Post-Op Visit Note   Patient: Christina [name]           Date of Birth: [birth year]           MRN: [id number] Visit Date: 06/17/2022 PCP: Maurice Small, MD   Assessment & Plan:  Chief Complaint:  Chief Complaint  Patient presents with   Right Knee - Follow-up    Right total knee arthroplasty 03/31/2022   Visit Diagnoses:  1. H/O total knee replacement, right     Plan: Patient is a pleasant 56 year old female who comes in today nearly 3 months status post right total knee replacement 03/31/2022.  She has been doing relatively well in regards to the knee.  She has been progressing with range of motion but continues to endorse pain to the medial knee.  She is also complaining of pain and swelling to the entire right lower extremity.  Of note, she was diagnosed with right lower extremity DVT with subsequent PE last month.  She is on Xarelto for this.  She is being referred to a clinic for right lower extremity wraps in the near future.  She has been taking Tylenol for pain.  Examination of the right knee reveals a small effusion.  Range of motion 0 to 110 degrees.  She does have medial joint line tenderness.  She is stable to valgus varus stress.  Calf is soft, swollen but nontender.  She is neurovascular intact distally.  At this point, she will continue with range of motion exercises.  Dental prophylaxis reinforced.  I sent in tramadol to take as needed.  She will follow-up in 3 months for repeat evaluation and 2 view x-rays of the right knee.  Call with concerns or questions.  Follow-Up Instructions: Return in about 3 months (around 09/17/2022).   Orders:  Orders Placed This Encounter  Procedures   XR Knee 1-2 Views Right   Meds ordered this encounter  Medications   traMADol (ULTRAM) 50 MG tablet    Sig: Take 1 tablet (50 mg total) by mouth every 12 (twelve) hours as needed.    Dispense:  60 tablet    Refill:  2    Imaging: XR Knee 1-2 Views Right  Result Date:  06/17/2022 Well-seated prosthesis without complication   PMFS History: Patient Active Problem List   Diagnosis Date Noted   DVT (deep venous thrombosis) ([place]) 05/11/2022   Acute pulmonary embolism ([place]) 05/09/2022   Chest pain 05/09/2022   Elevated troponin 05/09/2022   Status post total right knee replacement 03/31/2022   Primary osteoarthritis of right knee 03/20/2022   Eating disorder 03/20/2022   Acute medial meniscus tear, right, initial encounter 10/01/2021   At risk for dehydration 01/28/2021   At risk for malnutrition 01/01/2021   Obesity, Class I, BMI 30-34.9 12/31/2020   Vitamin B12 deficiency 10/08/2020   At risk for depression 10/08/2020   Mood disorder ([place]), with emotional eating 09/24/2020   At risk for impaired metabolic function 82/99/3716   At risk for diabetes mellitus 07/30/2020   Vitamin D deficiency 07/02/2020   Depression 07/02/2020   At risk for side effect of medication 07/02/2020   Stress due to illness of family member-  daughter with drug addiction 05/15/2020   Depression, recurrent ([place]) 05/15/2020   Anemia 05/15/2020   Constipation 05/15/2020   Prediabetes 05/15/2020   Breast asymmetry following reconstructive surgery 04/24/2020   Acquired absence of breast 04/24/2020   S/P breast reconstruction, bilateral 04/24/2020   Acute medial meniscus tear  of left knee 12/20/2019   Breast cancer, left ([place]) 01/09/2016   Genetic testing 03/07/1600   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   Family history of breast cancer in female 11/08/2015   Family history of colon cancer 11/08/2015   Malignant neoplasm of central portion of left breast in female, estrogen receptor positive ([place]) 10/31/2015   Endometriosis 03/11/2011   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,  multifocal IDC, DCIS, ER/PR+,  01-09-2016 s/p bilteral  mastectomies w/ left sln bx;  no chemoradiation   Cataracts, bilateral    Depression    Diabetes mellitus without complication (HCC)    type 2   GAD (generalized anxiety disorder)    Gallbladder problem    History of ovarian cyst    IBS (irritable bowel syndrome)    Joint pain    PONV (postoperative nausea and vomiting)    does well with scop patch   Retinal detachment    Rheg OS   Right knee meniscal tear    Urgency of urination     Family History  Problem Relation Age of Onset   Heart failure Father    Prostate cancer Father 58   Retinal detachment Father    Colon polyps Mother        approx 2   Other Mother        hx HPV and hysterectomy due to precancerous cells   Depression Mother    Anxiety disorder Mother    Other Sister 71       hx of hysterectomy for unspecified reason; still has ovaries   Other Sister 24       paternal half-sister hx of hysterectomy for unspecified reason; still has ovaries   Bladder Cancer Maternal Uncle 79       not a smoker   Kidney failure Maternal Grandmother    Congestive Heart Failure Maternal Grandmother    Colon cancer Maternal Grandmother 70   Diabetes Maternal Grandmother    Lung cancer Maternal Grandfather 76       smoker   Breast cancer Paternal Grandmother        dx. early 81s; w/ hx of trauma to breast   Crohn's disease Daughter    Lung cancer Maternal Uncle 37       smoker    Past Surgical History:  Procedure Laterality Date   ABDOMINAL HYSTERECTOMY  05/1996   endometriosis   ACHILLES TENDON REPAIR Right 2010;  revision 2011   BLADDER SUSPENSION  2000   BREAST BIOPSY Left 10/2015   BREAST IMPLANT REMOVAL Bilateral 08/12/2017   Procedure: REMOVAL BILATERAL BREAST IMPLANTS;  Surgeon: Wallace Going, DO;  Location: [place];  Service: Plastics;  Laterality: Bilateral;   BREAST RECONSTRUCTION WITH PLACEMENT OF TISSUE EXPANDER AND FLEX HD (ACELLULAR HYDRATED DERMIS) Bilateral 01/09/2016   BREAST  RECONSTRUCTION WITH PLACEMENT OF TISSUE EXPANDER AND FLEX HD (ACELLULAR HYDRATED DERMIS) Bilateral 01/09/2016   Procedure: BREAST RECONSTRUCTION WITH PLACEMENT OF TISSUE EXPANDER AND FLEX HD (ACELLULAR HYDRATED DERMIS);  Surgeon: Loel Lofty Dillingham, DO;  Location: [place];  Service: Plastics;  Laterality: Bilateral;   BREAST RECONSTRUCTION WITH PLACEMENT OF TISSUE EXPANDER AND FLEX HD (ACELLULAR HYDRATED DERMIS) Bilateral 05/29/2016   Procedure: PLACEMENT OF BILATERAL TISSUE EXPANDER AND FLEX HD (ACELLULAR HYDRATED DERMIS);  Surgeon: Loel Lofty Dillingham,  DO;  Location: [place];  Service: Plastics;  Laterality: Bilateral;   BREAST REDUCTION SURGERY Bilateral 11/26/2016   Procedure: BILATERAL BREAST CAPSULE CONTRACTURE RELASE;  Surgeon: Wallace Going, DO;  Location: [place];  Service: Plastics;  Laterality: Bilateral;   [place]; 1989; [place] Right x3   last one 02-01-2019 @ [facility]   EYE SURGERY Left 10/01/2020   Pneumatic retinopexy for repair of rheg RD - Dr. Bernarda Caffey   EYE SURGERY Left 10/04/2020   PPV - Dr. Bernarda Caffey   FAT GRAFTING BILATERAL BREAST  08-09-2018  '@[facility]'    GAS INSERTION Left 10/04/2020   Procedure: INSERTION OF GAS;  Surgeon: Bernarda Caffey, MD;  Location: [place];  Service: Ophthalmology;  Laterality: Left;   GAS/FLUID EXCHANGE Left 10/04/2020   Procedure: GAS/FLUID EXCHANGE;  Surgeon: Bernarda Caffey, MD;  Location: [place];  Service: Ophthalmology;  Laterality: Left;   INCISION AND DRAINAGE OF WOUND Bilateral 02/11/2016   Procedure: IRRIGATION AND DEBRIDEMENT OF BILATERAL BREAST POCKET;  Surgeon: Wallace Going, DO;  Location: [place];  Service: Plastics;  Laterality: Bilateral;   KNEE ARTHROSCOPY WITH MEDIAL MENISECTOMY Right 12/20/2019   Procedure: KNEE ARTHROSCOPY WITH MEDIAL MENISECTOMY;  Surgeon: Marchia Bond, MD;  Location: [place];  Service: Orthopedics;  Laterality:  Right;   KNEE ARTHROSCOPY WITH MEDIAL MENISECTOMY Right 10/30/2021   Procedure: RIGHT KNEE ARTHROSCOPY WITH PARTIAL MEDIAL MENISECTOMY SYNOVECTOMY;  Surgeon: Leandrew Koyanagi, MD;  Location: [place];  Service: Orthopedics;  Laterality: Right;   LAPAROSCOPIC APPENDECTOMY  04-07-2011   '@[facility]'    w/ Excision peritoneal lipoma and lysis adhesions   LAPAROSCOPIC CHOLECYSTECTOMY  ~ [place] Right 10/04/2020   Procedure: LASER RETINOPEXY WITH INDIRECT LASER OPTHALMOSCOPE, RIGHT EYE;  Surgeon: Bernarda Caffey, MD;  Location: [place];  Service: Ophthalmology;  Laterality: Right;   LIPOSUCTION WITH LIPOFILLING Bilateral 11/26/2016   Procedure: LIPOFILLING FOR SYMMETRY;  Surgeon: Wallace Going, DO;  Location: [place];  Service: Plastics;  Laterality: Bilateral;   LIPOSUCTION WITH LIPOFILLING Bilateral 01/21/2017   Procedure: BILATERAL BREAST  LIPOFILLING FOR ASYMMETRY;  Surgeon: Wallace Going, DO;  Location: [place];  Service: Plastics;  Laterality: Bilateral;   LIPOSUCTION WITH LIPOFILLING Bilateral 06/28/2020   Procedure: Lipofilling bilateral breasts for asymmetry;  Surgeon: Wallace Going, DO;  Location: [place];  Service: Plastics;  Laterality: Bilateral;  90 min   MASTECTOMY Bilateral 01/09/2016   NIPPLE SPARING MASTECTOMY/SENTINAL LYMPH NODE BIOPSY/RECONSTRUCTION/PLACEMENT OF TISSUE EXPANDER Bilateral 01/09/2016   Procedure: BILATERAL NIPPLE SPARING MASTECTOMY WITH LEFT SENTINAL LYMPH NODE BIOPSY ;  Surgeon: Stark Klein, MD;  Location: [place];  Service: General;  Laterality: Bilateral;   PHOTOCOAGULATION WITH LASER Left 10/04/2020   Procedure: PHOTOCOAGULATION WITH LASER;  Surgeon: Bernarda Caffey, MD;  Location: [place];  Service: Ophthalmology;  Laterality: Left;   REMOVAL OF BILATERAL TISSUE EXPANDERS WITH PLACEMENT OF BILATERAL BREAST IMPLANTS Bilateral 08/20/2016   Procedure: REMOVAL OF BILATERAL TISSUE  EXPANDERS WITH PLACEMENT OF BILATERAL SILICONE IMPLANTS;  Surgeon: Wallace Going, DO;  Location: [place];  Service: Plastics;  Laterality: Bilateral;   REMOVAL OF BILATERAL TISSUE EXPANDERS WITH PLACEMENT OF BILATERAL BREAST IMPLANTS Bilateral 11/05/2017   Procedure: REMOVAL OF BILATERAL TISSUE EXPANDERS WITH PLACEMENT OF BILATERAL BREAST SILICONE IMPLANTS;  Surgeon: Wallace Going, DO;  Location: [place];  Service: Plastics;  Laterality: Bilateral;   REMOVAL OF TISSUE EXPANDER Bilateral 02/11/2016   Procedure: REMOVAL OF BILATERAL TISSUE EXPANDERS AND FLEX HD REMOVAL;  Surgeon: Wallace Going, DO;  Location: [place];  Service: Plastics;  Laterality: Bilateral;   RETINAL DETACHMENT SURGERY Left 10/01/2020   Pneumatic retinopexy for repair of rheg RD - Dr. Bernarda Caffey   RETINAL DETACHMENT SURGERY Left 10/04/2020   PPV - Dr. Bernarda Caffey   SCLERAL BUCKLE Left 10/04/2020   Procedure: SCLERAL BUCKLE LEFT EYE;  Surgeon: Bernarda Caffey, MD;  Location: [place];  Service: Ophthalmology;  Laterality: Left;   TISSUE EXPANDER PLACEMENT Bilateral 08/12/2017   Procedure: PLACEMENT OF BILATERAL TISSUE EXPANDER;  Surgeon: Wallace Going, DO;  Location: [place];  Service: Plastics;  Laterality: Bilateral;   TOTAL KNEE ARTHROPLASTY Right 03/31/2022   Procedure: RIGHT TOTAL KNEE REPLACEMENT;  Surgeon: Leandrew Koyanagi, MD;  Location: [place];  Service: Orthopedics;  Laterality: Right;   TUBAL LIGATION Bilateral 1991   VITRECTOMY 25 GAUGE WITH SCLERAL BUCKLE Left 10/04/2020   Procedure: 25 GAUGE PARS PLANA VITRECTOMY LEFT EYE ;  Surgeon: Bernarda Caffey, MD;  Location: [place];  Service: Ophthalmology;  Laterality: Left;   Social History   Occupational History   Occupation: Holiday representative: Education administrator  Tobacco Use   Smoking status: Former    Packs/day: 1.00    Years: 10.00    Total pack years: 10.00    Types:  Cigarettes    Quit date: 06/08/2008    Years since quitting: 14.0   Smokeless tobacco: Never  Vaping Use   Vaping Use: Never used  Substance and Sexual Activity   Alcohol use: No   Drug use: Never   Sexual activity: Never    Birth control/protection: Surgical    Comment: hysterectomy

## 2022-06-18 ENCOUNTER — Other Ambulatory Visit: Payer: Self-pay | Admitting: *Deleted

## 2022-06-18 DIAGNOSIS — M7989 Other specified soft tissue disorders: Secondary | ICD-10-CM

## 2022-06-18 NOTE — Progress Notes (Signed)
Received call from pt with complaint of ongoing right lower extremity swelling and lymphedema post right total knee and right lower extremity DVT. Per MD referral needing to be placed to [facility] PT for possible PT and lymphedema care.  Orders placed.

## 2022-06-28 ENCOUNTER — Encounter (HOSPITAL_COMMUNITY): Payer: Self-pay | Admitting: Emergency Medicine

## 2022-06-28 ENCOUNTER — Emergency Department (HOSPITAL_COMMUNITY): Payer: Commercial Managed Care - HMO

## 2022-06-28 ENCOUNTER — Emergency Department (HOSPITAL_COMMUNITY)
Admission: EM | Admit: 2022-06-28 | Discharge: 2022-06-29 | Disposition: A | Discharge status: Home / Self Care | Payer: Commercial Managed Care - HMO | Attending: Emergency Medicine | Admitting: Emergency Medicine

## 2022-06-28 DIAGNOSIS — E876 Hypokalemia: Secondary | ICD-10-CM | POA: Diagnosis not present

## 2022-06-28 DIAGNOSIS — Z7901 Long term (current) use of anticoagulants: Secondary | ICD-10-CM | POA: Insufficient documentation

## 2022-06-28 DIAGNOSIS — R0789 Other chest pain: Secondary | ICD-10-CM | POA: Diagnosis present

## 2022-06-28 DIAGNOSIS — E119 Type 2 diabetes mellitus without complications: Secondary | ICD-10-CM | POA: Diagnosis not present

## 2022-06-28 DIAGNOSIS — R079 Chest pain, unspecified: Secondary | ICD-10-CM

## 2022-06-28 LAB — CBC
HCT: 39 % (ref 36.0–46.0)
Hemoglobin: 12.2 g/dL (ref 12.0–15.0)
MCH: 25.4 pg — ABNORMAL LOW (ref 26.0–34.0)
MCHC: 31.3 g/dL (ref 30.0–36.0)
MCV: 81.3 fL (ref 80.0–100.0)
Platelets: 349 10*3/uL (ref 150–400)
RBC: 4.8 MIL/uL (ref 3.87–5.11)
RDW: 15.9 % — ABNORMAL HIGH (ref 11.5–15.5)
WBC: 8.5 10*3/uL (ref 4.0–10.5)
nRBC: 0 % (ref 0.0–0.2)

## 2022-06-28 LAB — BASIC METABOLIC PANEL
Anion gap: 4 — ABNORMAL LOW (ref 5–15)
BUN: 16 mg/dL (ref 6–20)
CO2: 22 mmol/L (ref 22–32)
Calcium: 9.9 mg/dL (ref 8.9–10.3)
Chloride: 111 mmol/L (ref 98–111)
Creatinine, Ser: 0.79 mg/dL (ref 0.44–1.00)
GFR, Estimated: >60 mL/min (ref >60)
Glucose, Bld: 175 mg/dL — ABNORMAL HIGH (ref 70–99)
Potassium: 3.3 mmol/L — ABNORMAL LOW (ref 3.5–5.1)
Sodium: 137 mmol/L (ref 135–145)

## 2022-06-28 LAB — TROPONIN I (HIGH SENSITIVITY): Troponin I (High Sensitivity): 3 ng/L (ref <18)

## 2022-06-28 LAB — I-STAT BETA HCG BLOOD, ED (MC, WL, AP ONLY): I-stat hCG, quantitative: <5 m[IU]/mL (ref <5)

## 2022-06-28 NOTE — ED Triage Notes (Signed)
Pt reports L sided chest pain. States that it feels like when she had a PE last month, just on the other side. Difficult to get a deep breath. A&Ox4. Ambulatory.

## 2022-06-28 NOTE — ED Provider Triage Note (Signed)
Emergency Medicine Provider Triage Evaluation Note  Christina [name] , a 56 y.o. female  was evaluated in triage.  Pt complains of chest pain and shortness of breath for about an hour. States it feels the same as when she was diagnosed with a PE last month. Is on daily xarelto, no missed doses. Was otherwise feeling well today.   Review of Systems  Positive: CP, SOB Negative: Abd pain, cough  Physical Exam  BP (!) 162/83   Pulse 83   Temp 98.6 F (37 C)   Resp 18   LMP  (LMP Unknown)   SpO2 100%  Gen:   Awake, no distress   Resp:  Normal effort  MSK:   Moves extremities without difficulty  Other:    Medical Decision Making  Medically screening exam initiated at 10:57 PM.  Appropriate orders placed.  Christina [name] was informed that the remainder of the evaluation will be completed by another provider, this initial triage assessment does not replace that evaluation, and the importance of remaining in the ED until their evaluation is complete.  Workup initiated including PE scan   Christina Telfair T, PA-C 06/28/22 2258

## 2022-06-29 ENCOUNTER — Encounter (HOSPITAL_COMMUNITY): Payer: Self-pay | Admitting: Radiology

## 2022-06-29 ENCOUNTER — Emergency Department (HOSPITAL_COMMUNITY): Payer: Commercial Managed Care - HMO

## 2022-06-29 ENCOUNTER — Other Ambulatory Visit: Payer: Self-pay

## 2022-06-29 LAB — TROPONIN I (HIGH SENSITIVITY): Troponin I (High Sensitivity): 3 ng/L (ref <18)

## 2022-06-29 MED ORDER — POTASSIUM CHLORIDE CRYS ER 20 MEQ PO TBCR
40.0000 meq | EXTENDED_RELEASE_TABLET | Freq: Once | ORAL | Status: AC
  Administered 2022-06-29: 40 meq via ORAL
  Filled 2022-06-29: qty 2

## 2022-06-29 MED ORDER — IOHEXOL 350 MG/ML SOLN
80.0000 mL | Freq: Once | INTRAVENOUS | Status: AC | PRN
  Administered 2022-06-29: 80 mL via INTRAVENOUS

## 2022-06-29 MED ORDER — SODIUM CHLORIDE (PF) 0.9 % IJ SOLN
INTRAMUSCULAR | Status: AC
  Filled 2022-06-29: qty 50

## 2022-06-29 MED ORDER — POTASSIUM CHLORIDE CRYS ER 20 MEQ PO TBCR: 20.0000 meq | EXTENDED_RELEASE_TABLET | Freq: Two times a day (BID) | ORAL | 0 refills | Status: DC

## 2022-06-29 NOTE — Discharge Instructions (Signed)
Your evaluation did not show any signs of new blood clots or any signs of heart attack.  You may take acetaminophen as needed for pain.  Return to the emergency department for any new or concerning symptoms.  Your potassium was a little bit low tonight.  You have been given a prescription for potassium pills to take for the next 5 days to try to build that up.

## 2022-06-29 NOTE — ED Provider Notes (Signed)
[facility] DEPT Provider Note   CSN: [id number] Arrival date & time: 06/28/22  2229     History  Chief Complaint  Patient presents with   Chest Pain    Christina [name] is a 56 y.o. female.  The history is provided by the patient.  Chest Pain She has history of diabetes, breast cancer, pulmonary embolism anticoagulated on rivaroxaban and comes in with dull pain in the right lateral chest which started tonight.  Pain is worse with taking a deep breath.  She denies dyspnea, nausea, diaphoresis.  She is a non-smoker.  Pain is similar to what she had when diagnosed with a pulmonary embolism 1 month ago, but that pain was left-sided.  She is concerned about possible new pulmonary embolism.  She denies fever or chills and denies any cough.   Home Medications Prior to Admission medications   Medication Sig Start Date End Date Taking? Authorizing Provider  amitriptyline (ELAVIL) 150 MG tablet Take 150 mg by mouth at bedtime. 02/20/22   [provider]  buPROPion (WELLBUTRIN XL) 300 MG 24 hr tablet Take 300 mg by mouth every morning. 03/16/22   [provider]  cycloSPORINE (RESTASIS) 0.05 % ophthalmic emulsion Place 1 drop into both eyes 2 (two) times daily.    [provider]  fluconazole (DIFLUCAN) 150 MG tablet Take by mouth. 06/26/22   [provider]  methocarbamol (ROBAXIN-750) 750 MG tablet Take 1 tablet (750 mg total) by mouth 2 (two) times daily as needed. Patient taking differently: Take 750 mg by mouth 2 (two) times daily as needed for muscle spasms. 03/24/22   Aundra Dubin, PA-C  REXULTI 2 MG TABS tablet Take 2 mg by mouth at bedtime. 03/09/22   [provider]  RIVAROXABAN Alveda Reasons) VTE STARTER PACK (15 & 20 MG) Follow package directions: Take one '15mg'$  tablet by mouth twice a day. On day 22, switch to one '20mg'$  tablet once a day. Take with food. 05/12/22   [place] Donnamarie Poag, DO  traMADol (ULTRAM) 50 MG tablet  Take 1 tablet (50 mg total) by mouth every 12 (twelve) hours as needed. 06/17/22   Aundra Dubin, PA-C  Vitamin D, Ergocalciferol, (DRISDOL) 1.25 MG (50000 UNIT) CAPS capsule 1 po q wed and 1 po q sun Patient taking differently: Take 50,000 Units by mouth every 7 (seven) days. Every Sunday 03/18/22   Mellody Dance, DO      Allergies    Patient has no known allergies.    Review of Systems   Review of Systems  Cardiovascular:  Positive for chest pain.  All other systems reviewed and are negative.   Physical Exam Updated Vital Signs BP (!) 144/86 (BP Location: Right Arm)   Pulse 75   Temp 98.8 F (37.1 C) (Oral)   Resp 17   Ht '5\' 7"'$  (1.702 m)   Wt 86.2 kg   LMP  (LMP Unknown)   SpO2 98%   BMI 29.76 kg/m  Physical Exam Vitals reviewed.   56 year old female, resting comfortably and in no acute distress. Vital signs are significant for borderline elevated systolic blood pressure. Oxygen saturation is 98%, which is normal. Head is normocephalic and atraumatic. PERRLA, EOMI. Oropharynx is clear. Neck is nontender and supple without adenopathy or JVD. Back is nontender and there is no CVA tenderness. Lungs are clear without rales, wheezes, or rhonchi. Chest is nontender. Heart has regular rate and rhythm without murmur. Abdomen is soft, flat, nontender. Extremities  have no cyanosis or edema, full range of motion is present. Skin is warm and dry without rash. Neurologic: Mental status is normal, cranial nerves are intact, moves all extremities equally.  ED Results / Procedures / Treatments   Labs (all labs ordered are listed, but only abnormal results are displayed) Labs Reviewed  BASIC METABOLIC PANEL - Abnormal; Notable for the following components:      Result Value   Potassium 3.3 (*)    Glucose, Bld 175 (*)    Anion gap 4 (*)    All other components within normal limits  CBC - Abnormal; Notable for the following components:   MCH 25.4 (*)    RDW 15.9 (*)    All  other components within normal limits  I-STAT BETA HCG BLOOD, ED ([facility], [facility], [facility] ONLY)  TROPONIN I (HIGH SENSITIVITY)  TROPONIN I (HIGH SENSITIVITY)    EKG EKG Interpretation  Date/Time:  Saturday June 28 2022 22:36:01 EDT Ventricular Rate:  80 PR Interval:  222 QRS Duration: 126 QT Interval:  395 QTC Calculation: 456 R Axis:   56 Text Interpretation: Sinus rhythm Prolonged PR interval Nonspecific intraventricular conduction delay When compared with ECG of 05/08/2022, No significant change was found Confirmed by Delora Fuel ([id number]) on 06/29/2022 12:39:42 AM  Radiology CT Angio Chest PE W and/or Wo Contrast  Result Date: 06/29/2022 CLINICAL DATA:  Pulmonary embolus suspected with high probability. Right leg swelling since July 2023. Known pulmonary embolus on blood thinners. Left-sided chest pain and shortness of breath. History of breast cancer. EXAM: CT ANGIOGRAPHY CHEST WITH CONTRAST TECHNIQUE: Multidetector CT imaging of the chest was performed using the standard protocol during bolus administration of intravenous contrast. Multiplanar CT image reconstructions and MIPs were obtained to evaluate the vascular anatomy. RADIATION DOSE REDUCTION: This exam was performed according to the departmental dose-optimization program which includes automated exposure control, adjustment of the mA and/or kV according to patient size and/or use of iterative reconstruction technique. CONTRAST:  23m OMNIPAQUE IOHEXOL 350 MG/ML SOLN COMPARISON:  05/09/2022 FINDINGS: Cardiovascular: There is good opacification of the central and segmental pulmonary arteries. Minimal nonocclusive filling defects are demonstrated, most prominently in the branch points of the main pulmonary arteries bilaterally. This represents significant decrease in clot burden since the previous study. No new pulmonary emboli are demonstrated. Normal heart size. No pericardial effusions. Normal caliber thoracic aorta. No aortic dissection.  Mediastinum/Nodes: No enlarged mediastinal, hilar, or axillary lymph nodes. Thyroid gland, trachea, and esophagus demonstrate no significant findings. Bilateral breast implants. Lungs/Pleura: Patchy infiltrates in the lung bases may be due to atelectasis, edema, or pneumonia. Focal nodular consolidation in the superior segment of the right lower lung, series 10, image 55, measuring 10 mm diameter. Similar appearance to previous study. Infiltrates otherwise appear improved since prior study. No pleural effusions. No pneumothorax. Upper Abdomen: No acute abnormality. Surgical absence of the gallbladder. Musculoskeletal: No chest wall abnormality. No acute or significant osseous findings. Review of the MIP images confirms the above findings. IMPRESSION: 1. 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 No new pulmonary emboli are demonstrated. 2. Patchy infiltrates in the lungs are improving since previous study. Electronically Signed   By: WLucienne CapersM.D.   On: 06/29/2022 01:37   DG Chest 2 View  Result Date: 06/28/2022 CLINICAL DATA:  Left chest pain. EXAM: CHEST - 2 VIEW COMPARISON:  PA Lat chest and CTA chest both 05/09/2022 FINDINGS: The heart size and mediastinal contours are within  normal limits. Both lungs are clear. The visualized skeletal structures are unremarkable. Again noted are left axillary surgical clips and bilateral breast implants. IMPRESSION: No acute radiographic chest findings or interval changes. Electronically Signed   By: Telford Nab M.D.   On: 06/28/2022 23:19    Procedures Procedures  Cardiac monitor shows normal sinus rhythm, per my interpretation.  Medications Ordered in ED Medications  sodium chloride (PF) 0.9 % injection (  Not Given 06/29/22 0323)  iohexol (OMNIPAQUE) 350 MG/ML injection 80 mL (80 mLs Intravenous Contrast Given 06/29/22 0117)  potassium chloride SA (KLOR-CON M) CR tablet 40 mEq (40 mEq  Oral Given 06/29/22 0405)    ED Course/ Medical Decision Making/ A&P                           Medical Decision Making  Right-sided pleuritic chest pain in patient with known history of pulmonary embolism but currently anticoagulated.  With anticoagulation, recurrent pulmonary embolism is unlikely, but will need to check CT angiogram to be sure.  Other diagnostic possibilities include pneumonia, viral pleurisy, ACS.  I have reviewed and interpreted her electrocardiogram, and my interpretation is nonspecific intraventricular conduction delay unchanged from prior.  Chest x-ray shows no acute cardiopulmonary process.  I have independently viewed the images, and agree with radiologist interpretation.  CT angiogram of the chest showed no new pulmonary embolism, decreased clot burden compared with prior, improving infiltrates.  I have independently viewed the images, and agree with radiologist interpretation.  I have reviewed and interpreted her laboratory test, and my interpretation is mild hypokalemia, elevated glucose consistent with known history of diabetes, normal troponin x2, normal CBC.  No evidence of ACS.  I have explained to the patient went the findings mean.  Cause of her pain is unclear but does not appear to be anything serious.  Old records are reviewed, and she was admitted on 05/08/2022 for acute pulmonary embolism.  Because of hypokalemia, I have ordered a dose of oral potassium and a prescription for oral potassium as an outpatient.  I have instructed her to take over-the-counter acetaminophen as needed for pain, avoid NSAIDs because of anticoagulated state.  I have recommended follow-up with PCP, no other interventions necessary.  Final Clinical Impression(s) / ED Diagnoses Final diagnoses:  Nonspecific chest pain  Hypokalemia  Chronic anticoagulation    Rx / DC Orders ED Discharge Orders          Ordered    potassium chloride SA (KLOR-CON M) 20 MEQ tablet  2 times daily         06/29/22 8127              Delora Fuel, MD 51/70/01 [id number]

## 2022-07-02 ENCOUNTER — Ambulatory Visit: Payer: Commercial Managed Care - HMO | Attending: Hematology and Oncology | Admitting: Physical Therapy

## 2022-07-02 ENCOUNTER — Encounter: Payer: Self-pay | Admitting: Physical Therapy

## 2022-07-02 ENCOUNTER — Other Ambulatory Visit: Payer: Self-pay

## 2022-07-02 DIAGNOSIS — Z96651 Presence of right artificial knee joint: Secondary | ICD-10-CM | POA: Diagnosis not present

## 2022-07-02 DIAGNOSIS — M7989 Other specified soft tissue disorders: Secondary | ICD-10-CM | POA: Insufficient documentation

## 2022-07-02 DIAGNOSIS — R262 Difficulty in walking, not elsewhere classified: Secondary | ICD-10-CM | POA: Diagnosis not present

## 2022-07-02 DIAGNOSIS — R600 Localized edema: Secondary | ICD-10-CM | POA: Insufficient documentation

## 2022-07-02 NOTE — Therapy (Signed)
OUTPATIENT PHYSICAL THERAPY ONCOLOGY EVALUATION  Patient Name: Christina [name] MRN: [id number] DOB:1966-03-26, 56 y.o., female Today's Date: 07/02/2022   PT End of Session - 07/02/22 1620     Visit Number 1   for cancer rehab   Number of Visits 32   for cancer rehab   Date for PT Re-Evaluation 07/30/22    PT Start Time 1506    PT Stop Time 1552    PT Time Calculation (min) 46 min    Activity Tolerance Patient tolerated treatment well    Behavior During Therapy [facility] for tasks assessed/performed             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,  multifocal IDC, DCIS, ER/PR+,  01-09-2016 s/p bilteral mastectomies w/ left sln bx;  no chemoradiation   Cataracts, bilateral    Depression    Diabetes mellitus without complication ([place])    type 2   GAD (generalized anxiety disorder)    Gallbladder problem    History of ovarian cyst    IBS (irritable bowel syndrome)    Joint pain    PONV (postoperative nausea and vomiting)    does well with scop patch   Retinal detachment    Rheg OS   Right knee meniscal tear    Urgency of urination    Past Surgical History:  Procedure Laterality Date   ABDOMINAL HYSTERECTOMY  05/1996   endometriosis   ACHILLES TENDON REPAIR Right 2010;  revision 2011   BLADDER SUSPENSION  2000   BREAST BIOPSY Left 10/2015   BREAST IMPLANT REMOVAL Bilateral 08/12/2017   Procedure: REMOVAL BILATERAL BREAST IMPLANTS;  Surgeon: Wallace Going, DO;  Location: [place];  Service: Plastics;  Laterality: Bilateral;   BREAST RECONSTRUCTION WITH PLACEMENT OF TISSUE EXPANDER AND FLEX HD (ACELLULAR HYDRATED DERMIS) Bilateral 01/09/2016   BREAST RECONSTRUCTION WITH PLACEMENT OF TISSUE EXPANDER AND FLEX HD (ACELLULAR HYDRATED DERMIS) Bilateral 01/09/2016   Procedure: BREAST RECONSTRUCTION WITH PLACEMENT OF TISSUE EXPANDER  AND FLEX HD (ACELLULAR HYDRATED DERMIS);  Surgeon: Loel Lofty Dillingham, DO;  Location: [place];  Service: Plastics;  Laterality: Bilateral;   BREAST RECONSTRUCTION WITH PLACEMENT OF TISSUE EXPANDER AND FLEX HD (ACELLULAR HYDRATED DERMIS) Bilateral 05/29/2016   Procedure: PLACEMENT OF BILATERAL TISSUE EXPANDER AND FLEX HD (ACELLULAR HYDRATED DERMIS);  Surgeon: Wallace Going, DO;  Location: [place];  Service: Plastics;  Laterality: Bilateral;   BREAST REDUCTION SURGERY Bilateral 11/26/2016   Procedure: BILATERAL BREAST CAPSULE CONTRACTURE RELASE;  Surgeon: Wallace Going, DO;  Location: [place];  Service: Plastics;  Laterality: Bilateral;   [place]; 1989; [place] Right x3   last one 02-01-2019 @ [facility]   EYE SURGERY Left 10/01/2020   Pneumatic retinopexy for repair of rheg RD - Dr. Bernarda Caffey   EYE SURGERY Left 10/04/2020   PPV - Dr. Bernarda Caffey   FAT GRAFTING BILATERAL BREAST  08-09-2018  '@[facility]'    GAS INSERTION Left 10/04/2020   Procedure: INSERTION OF GAS;  Surgeon: Bernarda Caffey, MD;  Location: [place];  Service: Ophthalmology;  Laterality: Left;   GAS/FLUID EXCHANGE Left 10/04/2020   Procedure: GAS/FLUID EXCHANGE;  Surgeon: Bernarda Caffey, MD;  Location: [place];  Service: Ophthalmology;  Laterality: Left;   INCISION AND DRAINAGE OF WOUND  Bilateral 02/11/2016   Procedure: IRRIGATION AND DEBRIDEMENT OF BILATERAL BREAST POCKET;  Surgeon: Wallace Going, DO;  Location: [place];  Service: Plastics;  Laterality: Bilateral;   KNEE ARTHROSCOPY WITH MEDIAL MENISECTOMY Right 12/20/2019   Procedure: KNEE ARTHROSCOPY WITH MEDIAL MENISECTOMY;  Surgeon: Marchia Bond, MD;  Location: [place];  Service: Orthopedics;  Laterality: Right;   KNEE ARTHROSCOPY WITH MEDIAL MENISECTOMY Right 10/30/2021   Procedure: RIGHT KNEE ARTHROSCOPY WITH PARTIAL MEDIAL MENISECTOMY SYNOVECTOMY;  Surgeon: Leandrew Koyanagi, MD;   Location: [place];  Service: Orthopedics;  Laterality: Right;   LAPAROSCOPIC APPENDECTOMY  04-07-2011   '@[facility]'    w/ Excision peritoneal lipoma and lysis adhesions   LAPAROSCOPIC CHOLECYSTECTOMY  ~ [place] Right 10/04/2020   Procedure: LASER RETINOPEXY WITH INDIRECT LASER OPTHALMOSCOPE, RIGHT EYE;  Surgeon: Bernarda Caffey, MD;  Location: [place];  Service: Ophthalmology;  Laterality: Right;   LIPOSUCTION WITH LIPOFILLING Bilateral 11/26/2016   Procedure: LIPOFILLING FOR SYMMETRY;  Surgeon: Wallace Going, DO;  Location: [place];  Service: Plastics;  Laterality: Bilateral;   LIPOSUCTION WITH LIPOFILLING Bilateral 01/21/2017   Procedure: BILATERAL BREAST  LIPOFILLING FOR ASYMMETRY;  Surgeon: Wallace Going, DO;  Location: [place];  Service: Plastics;  Laterality: Bilateral;   LIPOSUCTION WITH LIPOFILLING Bilateral 06/28/2020   Procedure: Lipofilling bilateral breasts for asymmetry;  Surgeon: Wallace Going, DO;  Location: [place];  Service: Plastics;  Laterality: Bilateral;  90 min   MASTECTOMY Bilateral 01/09/2016   NIPPLE SPARING MASTECTOMY/SENTINAL LYMPH NODE BIOPSY/RECONSTRUCTION/PLACEMENT OF TISSUE EXPANDER Bilateral 01/09/2016   Procedure: BILATERAL NIPPLE SPARING MASTECTOMY WITH LEFT SENTINAL LYMPH NODE BIOPSY ;  Surgeon: Stark Klein, MD;  Location: [place];  Service: General;  Laterality: Bilateral;   PHOTOCOAGULATION WITH LASER Left 10/04/2020   Procedure: PHOTOCOAGULATION WITH LASER;  Surgeon: Bernarda Caffey, MD;  Location: [place];  Service: Ophthalmology;  Laterality: Left;   REMOVAL OF BILATERAL TISSUE EXPANDERS WITH PLACEMENT OF BILATERAL BREAST IMPLANTS Bilateral 08/20/2016   Procedure: REMOVAL OF BILATERAL TISSUE EXPANDERS WITH PLACEMENT OF BILATERAL SILICONE IMPLANTS;  Surgeon: Wallace Going, DO;  Location: [place];  Service: Plastics;  Laterality: Bilateral;   REMOVAL  OF BILATERAL TISSUE EXPANDERS WITH PLACEMENT OF BILATERAL BREAST IMPLANTS Bilateral 11/05/2017   Procedure: REMOVAL OF BILATERAL TISSUE EXPANDERS WITH PLACEMENT OF BILATERAL BREAST SILICONE IMPLANTS;  Surgeon: Wallace Going, DO;  Location: [place];  Service: Plastics;  Laterality: Bilateral;   REMOVAL OF TISSUE EXPANDER Bilateral 02/11/2016   Procedure: REMOVAL OF BILATERAL TISSUE EXPANDERS AND FLEX HD REMOVAL;  Surgeon: Wallace Going, DO;  Location: [place];  Service: Plastics;  Laterality: Bilateral;   RETINAL DETACHMENT SURGERY Left 10/01/2020   Pneumatic retinopexy for repair of rheg RD - Dr. Bernarda Caffey   RETINAL DETACHMENT SURGERY Left 10/04/2020   PPV - Dr. Bernarda Caffey   SCLERAL BUCKLE Left 10/04/2020   Procedure: SCLERAL BUCKLE LEFT EYE;  Surgeon: Bernarda Caffey, MD;  Location: [place];  Service: Ophthalmology;  Laterality: Left;   TISSUE EXPANDER PLACEMENT Bilateral 08/12/2017   Procedure: PLACEMENT OF BILATERAL TISSUE EXPANDER;  Surgeon: Wallace Going, DO;  Location: [place];  Service: Plastics;  Laterality: Bilateral;   TOTAL KNEE ARTHROPLASTY Right 03/31/2022   Procedure: RIGHT TOTAL KNEE REPLACEMENT;  Surgeon: Leandrew Koyanagi, MD;  Location: [place];  Service: Orthopedics;  Laterality: Right;   TUBAL LIGATION  Bilateral 1991   VITRECTOMY 25 GAUGE WITH SCLERAL BUCKLE Left 10/04/2020   Procedure: 79 GAUGE PARS PLANA VITRECTOMY LEFT EYE ;  Surgeon: Bernarda Caffey, MD;  Location: [place];  Service: Ophthalmology;  Laterality: Left;   Patient Active Problem List   Diagnosis Date Noted   DVT (deep venous thrombosis) ([place]) 05/11/2022   Acute pulmonary embolism ([place]) 05/09/2022   Chest pain 05/09/2022   Elevated troponin 05/09/2022   Status post total right knee replacement 03/31/2022   Primary osteoarthritis of right knee 03/20/2022   Eating disorder 03/20/2022   Acute medial meniscus tear, right, initial encounter 10/01/2021    At risk for dehydration 01/28/2021   At risk for malnutrition 01/01/2021   Obesity, Class I, BMI 30-34.9 12/31/2020   Vitamin B12 deficiency 10/08/2020   At risk for depression 10/08/2020   Mood disorder ([place]), with emotional eating 09/24/2020   At risk for impaired metabolic function 01/00/7121   At risk for diabetes mellitus 07/30/2020   Vitamin D deficiency 07/02/2020   Depression 07/02/2020   At risk for side effect of medication 07/02/2020   Stress due to illness of family member-  daughter with drug addiction 05/15/2020   Depression, recurrent ([place]) 05/15/2020   Anemia 05/15/2020   Constipation 05/15/2020   Prediabetes 05/15/2020   Breast asymmetry following reconstructive surgery 04/24/2020   Acquired absence of breast 04/24/2020   S/P breast reconstruction, bilateral 04/24/2020   Acute medial meniscus tear of left knee 12/20/2019   Breast cancer, left ([place]) 01/09/2016   Genetic testing 97/58/8325   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   Family history of breast cancer in female 11/08/2015   Family history of colon cancer 11/08/2015   Malignant neoplasm of central portion of left breast in female, estrogen receptor positive ([place]) 10/31/2015   Endometriosis 03/11/2011    PCP: Maurice Small, MD  REFERRING PROVIDER: Nicholas Lose, MD   REFERRING DIAG: M79.89 (ICD-10-CM) - Right leg swelling   THERAPY DIAG:  Localized edema  Difficulty in walking, not elsewhere classified  History of total right knee replacement  ONSET DATE: 03/31/22  Rationale for Evaluation and Treatment Rehabilitation  SUBJECTIVE:                                                                                                                                                                                           SUBJECTIVE STATEMENT:  PERTINENT HISTORY:  Right lower extremity swelling/ lymphedema with known right lower extremity DVT 05/12/22 and hx of right total knee 03/31/22, L breast  cancer in 2017 with prophylactic bilateral mastectomy PAIN:  Are you having pain? No  PRECAUTIONS: Other:  R TKA 03/31/22  WEIGHT BEARING RESTRICTIONS: No  FALLS:  Has patient fallen in last 6 months? 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  LEISURE: pt does not exercise  HAND DOMINANCE: right   PRIOR LEVEL OF FUNCTION: Independent  PATIENT GOALS: to walk without a cane, to walk where it does not hurt as much   OBJECTIVE:  COGNITION: Overall cognitive status: Within functional limits for tasks assessed   PALPATION: Fibrosis throughout R LE, pitting edema  OBSERVATIONS / OTHER ASSESSMENTS: RLE from knee down approx twice the size of left    LOWER EXTREMITY LANDMARK RIGHT eval  At groin   30 cm proximal to suprapatella   20 cm proximal to suprapatella 55  10 cm proximal to suprapatella 48  At midpatella / popliteal crease 43.9  30 cm proximal to floor at lateral plantar foot 39.8  20 cm proximal to floor at lateral plantar foot 31.9  10 cm proximal to floor at lateral plantar foot 26.5  Circumference of ankle/heel   5 cm proximal to 1st MTP joint 22.9  Across MTP joint 23.1  Around proximal great toe 8.6  (Blank rows = not tested)  LOWER EXTREMITY LANDMARK LEFT eval  At groin   30 cm proximal to suprapatella   20 cm proximal to suprapatella 56.2  10 cm proximal to suprapatella 47.5  At midpatella / popliteal crease 40.5  30 cm proximal to floor at lateral plantar foot 36.9  20 cm proximal to floor at lateral plantar foot 26.8  10 cm proximal to floor at lateral plantar foot 21.9  Circumference of ankle/heel   5 cm proximal to 1st MTP joint 22.1  Across MTP joint 22.2  Around proximal  great toe 8.3  (Blank rows = not tested)  TODAY'S TREATMENT:                                                                                                                           DATE:  07/02/22- Compression bandaging to RLE in sitting: TG soft from foot to mid thigh, mollelast to digits 1-5, artiflex from foot to knee, rosidal from just below knee to mid thigh, 1 6cm bandage at foot, 1 8cm bandage at ankle, 1 12 cm from foot to knee, 1 12 cm from lower leg to mid thigh in herringbone, 1 12 cm from lower leg to mid thigh  PATIENT EDUCATION:  Education details: causes of edema, how a DVT can lead to prolonged edema in combination with recent surgery, need for bandaging and then eventually compression garments Person educated: Patient Education method: Explanation Education comprehension: verbalized understanding  HOME EXERCISE PROGRAM: Wear compression bandaging 24/7 and remove if pain or numbness that does not go  away when you move  ASSESSMENT:  CLINICAL IMPRESSION: Patient is a 56 y.o. female who was seen today for physical therapy evaluation and treatment for R LE edema. Pt underwent a R TKA on 03/31/22 and since that time has had swelling in her leg. She then developed a DVT the week of labor day and the swelling has worsened. She also has low potassium which can further exacerbate edema. She would benefit from compression bandaging to help decrease edema and eventually be fitted for compression garments to help decrease risk of infection and improve comfort to decrease difficulty walking.    OBJECTIVE IMPAIRMENTS: decreased knowledge of condition, decreased knowledge of use of DME, increased edema, and pain.   ACTIVITY LIMITATIONS: standing and standing, walking  PARTICIPATION LIMITATIONS: occupation  PERSONAL FACTORS:  none  are also affecting patient's functional outcome.   REHAB POTENTIAL: Excellent  CLINICAL DECISION MAKING: Stable/uncomplicated  EVALUATION COMPLEXITY:  Low  GOALS: Goals reviewed with patient? Yes  SHORT TERM GOALS=LONG TERM GOALS Target date: 07/30/2022    Pt will demonstrate a 4 cm decrease in edema 10 cm proximal to lateral malleoli to improve comfort.  Baseline: 26.5cm  Goal status: INITIAL  2.  Pt will obtain appropriate compression garments for long term management of edema.  Baseline:  Goal status: INITIAL  3.  Pt will be independent in self MLD for long term management of edema.  Baseline:  Goal status: INITIAL  4.  Pt will report a 75% improvement in discomfort and pain in RLE to allow improved ability to walk and stand.  Baseline:  Goal status: INITIAL  PLAN:  PT FREQUENCY: 3x/week  PT DURATION: 4 weeks  PLANNED INTERVENTIONS: Therapeutic exercises, Therapeutic activity, Patient/Family education, Self Care, Orthotic/Fit training, Manual lymph drainage, Compression bandaging, Taping, Vasopneumatic device, and Manual therapy  PLAN FOR NEXT SESSION: MLD to RLE followed by bandaging   Manus Gunning, PT 07/02/2022, 4:22 PM

## 2022-07-04 ENCOUNTER — Ambulatory Visit: Payer: Commercial Managed Care - HMO | Admitting: Rehabilitation

## 2022-07-04 ENCOUNTER — Encounter: Payer: Self-pay | Admitting: Rehabilitation

## 2022-07-04 DIAGNOSIS — M25661 Stiffness of right knee, not elsewhere classified: Secondary | ICD-10-CM

## 2022-07-04 DIAGNOSIS — M7989 Other specified soft tissue disorders: Secondary | ICD-10-CM | POA: Diagnosis not present

## 2022-07-04 DIAGNOSIS — R262 Difficulty in walking, not elsewhere classified: Secondary | ICD-10-CM

## 2022-07-04 DIAGNOSIS — R600 Localized edema: Secondary | ICD-10-CM

## 2022-07-04 DIAGNOSIS — Z96651 Presence of right artificial knee joint: Secondary | ICD-10-CM

## 2022-07-04 DIAGNOSIS — G8929 Other chronic pain: Secondary | ICD-10-CM

## 2022-07-04 DIAGNOSIS — M6281 Muscle weakness (generalized): Secondary | ICD-10-CM

## 2022-07-04 NOTE — Therapy (Signed)
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,  multifocal IDC, DCIS, ER/PR+,  01-09-2016 s/p bilteral mastectomies w/ left sln bx;  no chemoradiation   Cataracts, bilateral    Depression    Diabetes mellitus without complication ([place])    type 2   GAD (generalized anxiety disorder)    Gallbladder problem    History of ovarian cyst    IBS (irritable bowel syndrome)    Joint pain    PONV (postoperative nausea and vomiting)    does well with scop patch   Retinal detachment    Rheg OS   Right knee meniscal tear    Urgency of urination    Past Surgical History:  Procedure Laterality Date   ABDOMINAL HYSTERECTOMY  05/1996   endometriosis   ACHILLES TENDON REPAIR Right 2010;  revision 2011   BLADDER SUSPENSION  2000   BREAST BIOPSY Left 10/2015   BREAST IMPLANT REMOVAL Bilateral 08/12/2017   Procedure: REMOVAL BILATERAL BREAST IMPLANTS;  Surgeon: Wallace Going, DO;  Location: [place];  Service: Plastics;  Laterality: Bilateral;   BREAST RECONSTRUCTION WITH PLACEMENT OF TISSUE EXPANDER AND FLEX HD (ACELLULAR HYDRATED DERMIS) Bilateral 01/09/2016   BREAST RECONSTRUCTION WITH PLACEMENT OF TISSUE EXPANDER AND FLEX HD (ACELLULAR HYDRATED DERMIS) Bilateral 01/09/2016   Procedure: BREAST RECONSTRUCTION WITH PLACEMENT OF TISSUE EXPANDER AND FLEX HD (ACELLULAR HYDRATED DERMIS);  Surgeon: Loel Lofty Dillingham, DO;  Location: [place];  Service: Plastics;  Laterality: Bilateral;   BREAST RECONSTRUCTION WITH PLACEMENT OF TISSUE EXPANDER AND FLEX HD (ACELLULAR HYDRATED DERMIS) Bilateral 05/29/2016   Procedure: PLACEMENT OF BILATERAL TISSUE EXPANDER AND FLEX HD (ACELLULAR HYDRATED DERMIS);  Surgeon: Wallace Going, DO;  Location: [place];  Service: Plastics;  Laterality: Bilateral;   BREAST REDUCTION SURGERY Bilateral 11/26/2016   Procedure: BILATERAL BREAST CAPSULE CONTRACTURE RELASE;  Surgeon: Wallace Going, DO;  Location: [place];  Service: Plastics;  Laterality: Bilateral;   [place]; 1989; [place] Right x3   last one 02-01-2019 @ [facility]   EYE SURGERY Left 10/01/2020   Pneumatic retinopexy for repair of rheg RD - Dr. Bernarda Caffey   EYE SURGERY Left 10/04/2020   PPV - Dr. Bernarda Caffey   FAT GRAFTING BILATERAL BREAST  08-09-2018  _0    GAS INSERTION Left 10/04/2020   Procedure: INSERTION OF GAS;  Surgeon: Bernarda Caffey, MD;  Location: [place];  Service: Ophthalmology;  Laterality: Left;   GAS/FLUID EXCHANGE Left 10/04/2020   Procedure: GAS/FLUID EXCHANGE;  Surgeon: Bernarda Caffey, MD;  Location: [place];  Service: Ophthalmology;  Laterality: Left;   INCISION AND DRAINAGE OF WOUND Bilateral 02/11/2016   Procedure: IRRIGATION AND DEBRIDEMENT OF BILATERAL BREAST POCKET;  Surgeon: Wallace Going, DO;  Location: [place];  Service: Plastics;  Laterality: Bilateral;   KNEE ARTHROSCOPY WITH MEDIAL MENISECTOMY Right 12/20/2019   Procedure: KNEE ARTHROSCOPY WITH MEDIAL MENISECTOMY;  Surgeon: Marchia Bond, MD;  Location: [place];  Service: Orthopedics;  Laterality: Right;   KNEE ARTHROSCOPY WITH MEDIAL MENISECTOMY Right 10/30/2021   Procedure: RIGHT KNEE ARTHROSCOPY WITH PARTIAL MEDIAL MENISECTOMY SYNOVECTOMY;  Surgeon: Leandrew Koyanagi, MD;  Location: [place];  Service: Orthopedics;   Laterality: Right;   LAPAROSCOPIC APPENDECTOMY  04-07-2011   _0    w/ Excision peritoneal lipoma and lysis adhesions   LAPAROSCOPIC CHOLECYSTECTOMY  ~ [place] Right 10/04/2020   Procedure: LASER RETINOPEXY WITH INDIRECT LASER OPTHALMOSCOPE, RIGHT EYE;  Surgeon: Bernarda Caffey, MD;  Location: [place];  Service: Ophthalmology;  Laterality: Right;   LIPOSUCTION WITH LIPOFILLING Bilateral 11/26/2016   Procedure: LIPOFILLING FOR SYMMETRY;  Surgeon: Wallace Going, DO;  Location: [place];  Service: Plastics;  Laterality: Bilateral;   LIPOSUCTION WITH LIPOFILLING Bilateral 01/21/2017   Procedure: BILATERAL BREAST  LIPOFILLING FOR ASYMMETRY;  Surgeon: Wallace Going, DO;  Location: [place];  Service: Plastics;  Laterality: Bilateral;   LIPOSUCTION WITH LIPOFILLING Bilateral 06/28/2020   Procedure: Lipofilling bilateral breasts for asymmetry;  Surgeon: Wallace Going, DO;  Location: [place];  Service: Plastics;  Laterality: Bilateral;  90 min   MASTECTOMY Bilateral 01/09/2016   NIPPLE SPARING MASTECTOMY/SENTINAL LYMPH NODE BIOPSY/RECONSTRUCTION/PLACEMENT OF TISSUE EXPANDER Bilateral 01/09/2016   Procedure: BILATERAL NIPPLE SPARING MASTECTOMY WITH LEFT SENTINAL LYMPH NODE BIOPSY ;  Surgeon: Stark Klein, MD;  Location: [place];  Service: General;  Laterality: Bilateral;   PHOTOCOAGULATION WITH LASER Left 10/04/2020   Procedure: PHOTOCOAGULATION WITH LASER;  Surgeon: Bernarda Caffey, MD;  Location: [place];  Service: Ophthalmology;  Laterality: Left;   REMOVAL OF BILATERAL TISSUE EXPANDERS WITH PLACEMENT OF BILATERAL BREAST IMPLANTS Bilateral 08/20/2016   Procedure: REMOVAL OF BILATERAL TISSUE EXPANDERS WITH PLACEMENT OF BILATERAL SILICONE IMPLANTS;  Surgeon: Wallace Going, DO;  Location: [place];  Service: Plastics;  Laterality: Bilateral;   REMOVAL OF BILATERAL TISSUE EXPANDERS WITH PLACEMENT OF BILATERAL  BREAST IMPLANTS Bilateral 11/05/2017   Procedure: REMOVAL OF BILATERAL TISSUE EXPANDERS WITH PLACEMENT OF BILATERAL BREAST SILICONE IMPLANTS;  Surgeon: Wallace Going, DO;  Location: [place];  Service: Plastics;  Laterality: Bilateral;   REMOVAL OF TISSUE EXPANDER Bilateral 02/11/2016   Procedure: REMOVAL OF BILATERAL TISSUE EXPANDERS AND FLEX HD REMOVAL;  Surgeon: Wallace Going, DO;  Location: [place];  Service: Plastics;  Laterality: Bilateral;   RETINAL DETACHMENT SURGERY Left 10/01/2020   Pneumatic retinopexy for repair of rheg RD - Dr. Bernarda Caffey   RETINAL DETACHMENT SURGERY Left 10/04/2020   PPV - Dr. Bernarda Caffey   SCLERAL BUCKLE Left 10/04/2020   Procedure: SCLERAL BUCKLE LEFT EYE;  Surgeon: Bernarda Caffey, MD;  Location: [place];  Service: Ophthalmology;  Laterality: Left;   TISSUE EXPANDER PLACEMENT Bilateral 08/12/2017   Procedure: PLACEMENT OF BILATERAL TISSUE EXPANDER;  Surgeon: Wallace Going, DO;  Location: [place];  Service: Plastics;  Laterality: Bilateral;   TOTAL KNEE ARTHROPLASTY Right 03/31/2022   Procedure: RIGHT TOTAL KNEE REPLACEMENT;  Surgeon: Leandrew Koyanagi, MD;  Location: [place];  Service: Orthopedics;  Laterality: Right;   TUBAL LIGATION Bilateral 1991   VITRECTOMY 25 GAUGE WITH SCLERAL BUCKLE Left 10/04/2020  Procedure: [place] VITRECTOMY LEFT EYE ;  Surgeon: Bernarda Caffey, MD;  Location: [place];  Service: Ophthalmology;  Laterality: Left;   Patient Active Problem List   Diagnosis Date Noted   DVT (deep venous thrombosis) ([place]) 05/11/2022   Acute pulmonary embolism ([place]) 05/09/2022   Chest pain 05/09/2022   Elevated troponin 05/09/2022   Status post total right knee replacement 03/31/2022   Primary osteoarthritis of right knee 03/20/2022   Eating disorder 03/20/2022   Acute medial meniscus tear, right, initial encounter 10/01/2021   At risk for dehydration 01/28/2021   At risk for  malnutrition 01/01/2021   Obesity, Class I, BMI 30-34.9 12/31/2020   Vitamin B12 deficiency 10/08/2020   At risk for depression 10/08/2020   Mood disorder ([place]), with emotional eating 09/24/2020   At risk for impaired metabolic function 70/78/6754   At risk for diabetes mellitus 07/30/2020   Vitamin D deficiency 07/02/2020   Depression 07/02/2020   At risk for side effect of medication 07/02/2020   Stress due to illness of family member-  daughter with drug addiction 05/15/2020   Depression, recurrent ([place]) 05/15/2020   Anemia 05/15/2020   Constipation 05/15/2020   Prediabetes 05/15/2020   Breast asymmetry following reconstructive surgery 04/24/2020   Acquired absence of breast 04/24/2020   S/P breast reconstruction, bilateral 04/24/2020   Acute medial meniscus tear of left knee 12/20/2019   Breast cancer, left ([place]) 01/09/2016   Genetic testing 49/20/1007   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   Family history of breast cancer in female 11/08/2015   Family history of colon cancer 11/08/2015   Malignant neoplasm of central portion of left breast in female, estrogen receptor positive ([place]) 10/31/2015   Endometriosis 03/11/2011    PCP: Maurice Small, MD  REFERRING PROVIDER: Nicholas Lose, MD   REFERRING DIAG: M79.89 (ICD-10-CM) - Right leg swelling   THERAPY DIAG:  Localized edema  Difficulty in walking, not elsewhere classified  History of total right knee replacement  Muscle weakness (generalized)  Stiffness of right knee, not elsewhere classified  Chronic pain of right knee  ONSET DATE: 03/31/22  Rationale for Evaluation and Treatment Rehabilitation  SUBJECTIVE:                                                                                                                                                                                           SUBJECTIVE STATEMENT: The bandages were fine since Wednesday   PERTINENT HISTORY:  Right lower extremity  swelling/ lymphedema with known right lower extremity DVT 05/12/22 and hx of right total knee 03/31/22, L breast cancer in 2017 with prophylactic bilateral  mastectomy PAIN:  Are you having pain? No  PRECAUTIONS: Other: R TKA 03/31/22  WEIGHT BEARING RESTRICTIONS: No  FALLS:  Has patient fallen in last 6 months? 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  LEISURE: pt does not exercise  HAND DOMINANCE: right   PRIOR LEVEL OF FUNCTION: Independent  PATIENT GOALS: to walk without a cane, to walk where it does not hurt as much   OBJECTIVE:  COGNITION: Overall cognitive status: Within functional limits for tasks assessed   PALPATION: Fibrosis throughout R LE, pitting edema  OBSERVATIONS / OTHER ASSESSMENTS: RLE from knee down approx twice the size of left    LOWER EXTREMITY LANDMARK RIGHT eval 07/04/22  At groin    30 cm proximal to suprapatella    20 cm proximal to suprapatella 55 55  10 cm proximal to suprapatella 48 47  At midpatella / popliteal crease 43.9 44  30 cm proximal to floor at lateral plantar foot 39.8 37.5  20 cm proximal to floor at lateral plantar foot 31._0 cm proximal to floor at lateral plantar foot 26.5 24.7  Circumference of ankle/heel    5 cm proximal to 1st MTP joint 22.9 23  Across MTP joint 23.1 23  Around proximal great toe 8.6 8.6  (Blank rows = not tested)  LOWER EXTREMITY LANDMARK LEFT eval  At groin   30 cm proximal to suprapatella   20 cm proximal to suprapatella 56.2  10 cm proximal to suprapatella 47.5  At midpatella / popliteal crease 40.5  30 cm proximal to floor at lateral plantar foot 36.9  20 cm proximal to floor at lateral plantar foot 26.8  10 cm proximal  to floor at lateral plantar foot 21.9  Circumference of ankle/heel   5 cm proximal to 1st MTP joint 22.1  Across MTP joint 22.2  Around proximal great toe 8.3  (Blank rows = not tested)  TODAY'S TREATMENT:                                                                                                                           DATE:  07/04/22:   Removed bandages, washed pts leg, remeasured LE then reapplied Compression bandaging to RLE in sitting: TG soft from foot to mid thigh, mollelast to digits 1-5, artiflex from foot to knee, rosidal from just below knee to mid thigh, 1 6cm bandage at foot, 1 8cm bandage at ankle, 1 12 cm from foot to knee, 1 12 cm from lower leg to mid thigh in herringbone, 1 12 cm from lower leg to mid thigh  07/02/22- Compression bandaging to RLE in sitting: TG soft from foot to mid thigh, mollelast to digits 1-5, artiflex from foot to  knee, rosidal from just below knee to mid thigh, 1 6cm bandage at foot, 1 8cm bandage at ankle, 1 12 cm from foot to knee, 1 12 cm from lower leg to mid thigh in herringbone, 1 12 cm from lower leg to mid thigh  PATIENT EDUCATION:  Education details: causes of edema, how a DVT can lead to prolonged edema in combination with recent surgery, need for bandaging and then eventually compression garments Person educated: Patient Education method: Explanation Education comprehension: verbalized understanding  HOME EXERCISE PROGRAM: Wear compression bandaging 24/7 and remove if pain or numbness that does not go away when you move  ASSESSMENT:  CLINICAL IMPRESSION:   Pt shows reduction already in the LE and some softening although still very firm at the lower leg.  Reapplied bandaging today with review of when to remove.  Will start MLD next week.   OBJECTIVE IMPAIRMENTS: decreased knowledge of condition, decreased knowledge of use of DME, increased edema, and pain.   ACTIVITY LIMITATIONS: standing and standing, walking  PARTICIPATION  LIMITATIONS: occupation  PERSONAL FACTORS:  none  are also affecting patient's functional outcome.   REHAB POTENTIAL: Excellent  CLINICAL DECISION MAKING: Stable/uncomplicated  EVALUATION COMPLEXITY: Low  GOALS: Goals reviewed with patient? Yes  SHORT TERM GOALS=LONG TERM GOALS Target date: 08/01/2022    Pt will demonstrate a 4 cm decrease in edema 10 cm proximal to lateral malleoli to improve comfort.  Baseline: 26.5cm  Goal status: INITIAL  2.  Pt will obtain appropriate compression garments for long term management of edema.  Baseline:  Goal status: INITIAL  3.  Pt will be independent in self MLD for long term management of edema.  Baseline:  Goal status: INITIAL  4.  Pt will report a 75% improvement in discomfort and pain in RLE to allow improved ability to walk and stand.  Baseline:  Goal status: INITIAL  PLAN:  PT FREQUENCY: 3x/week  PT DURATION: 4 weeks  PLANNED INTERVENTIONS: Therapeutic exercises, Therapeutic activity, Patient/Family education, Self Care, Orthotic/Fit training, Manual lymph drainage, Compression bandaging, Taping, Vasopneumatic device, and Manual therapy  PLAN FOR NEXT SESSION: MLD to RLE followed by bandaging   Stark Bray, PT 07/04/2022, 9:48 AM

## 2022-07-07 ENCOUNTER — Ambulatory Visit: Payer: Commercial Managed Care - HMO | Admitting: Physical Therapy

## 2022-07-07 ENCOUNTER — Encounter: Payer: Self-pay | Admitting: Physical Therapy

## 2022-07-07 DIAGNOSIS — Z96651 Presence of right artificial knee joint: Secondary | ICD-10-CM

## 2022-07-07 DIAGNOSIS — M7989 Other specified soft tissue disorders: Secondary | ICD-10-CM | POA: Diagnosis not present

## 2022-07-07 DIAGNOSIS — R262 Difficulty in walking, not elsewhere classified: Secondary | ICD-10-CM

## 2022-07-07 DIAGNOSIS — R600 Localized edema: Secondary | ICD-10-CM

## 2022-07-07 NOTE — Therapy (Signed)
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,  multifocal IDC, DCIS, ER/PR+,  01-09-2016 s/p bilteral mastectomies w/ left sln bx;  no chemoradiation   Cataracts, bilateral    Depression    Diabetes mellitus without complication ([place])    type 2   GAD (generalized anxiety disorder)    Gallbladder problem    History of ovarian cyst    IBS (irritable bowel syndrome)    Joint pain    PONV (postoperative nausea and vomiting)    does well with scop patch   Retinal detachment    Rheg OS   Right knee meniscal tear    Urgency of urination    Past Surgical History:  Procedure Laterality Date   ABDOMINAL HYSTERECTOMY  05/1996   endometriosis   ACHILLES TENDON REPAIR Right 2010;  revision 2011   BLADDER SUSPENSION  2000   BREAST BIOPSY Left 10/2015   BREAST IMPLANT REMOVAL Bilateral 08/12/2017   Procedure: REMOVAL BILATERAL BREAST IMPLANTS;  Surgeon: Wallace Going, DO;  Location: [place];  Service: Plastics;  Laterality: Bilateral;   BREAST RECONSTRUCTION WITH PLACEMENT OF TISSUE EXPANDER AND FLEX HD (ACELLULAR HYDRATED DERMIS) Bilateral 01/09/2016   BREAST RECONSTRUCTION WITH PLACEMENT OF TISSUE EXPANDER AND FLEX HD (ACELLULAR HYDRATED DERMIS) Bilateral 01/09/2016   Procedure: BREAST RECONSTRUCTION WITH PLACEMENT OF TISSUE EXPANDER AND FLEX HD  (ACELLULAR HYDRATED DERMIS);  Surgeon: Loel Lofty Dillingham, DO;  Location: [place];  Service: Plastics;  Laterality: Bilateral;   BREAST RECONSTRUCTION WITH PLACEMENT OF TISSUE EXPANDER AND FLEX HD (ACELLULAR HYDRATED DERMIS) Bilateral 05/29/2016   Procedure: PLACEMENT OF BILATERAL TISSUE EXPANDER AND FLEX HD (ACELLULAR HYDRATED DERMIS);  Surgeon: Wallace Going, DO;  Location: [place];  Service: Plastics;  Laterality: Bilateral;   BREAST REDUCTION SURGERY Bilateral 11/26/2016   Procedure: BILATERAL BREAST CAPSULE CONTRACTURE RELASE;  Surgeon: Wallace Going, DO;  Location: [place];  Service: Plastics;  Laterality: Bilateral;   [place]; 1989; [place] Right x3   last one 02-01-2019 @ [facility]   EYE SURGERY Left 10/01/2020   Pneumatic retinopexy for repair of rheg RD - Dr. Bernarda Caffey   EYE SURGERY Left 10/04/2020   PPV - Dr. Bernarda Caffey   FAT GRAFTING BILATERAL BREAST  08-09-2018  _0    GAS INSERTION Left 10/04/2020   Procedure: INSERTION OF GAS;  Surgeon: Bernarda Caffey, MD;  Location: [place];  Service: Ophthalmology;  Laterality: Left;   GAS/FLUID EXCHANGE Left 10/04/2020   Procedure: GAS/FLUID EXCHANGE;  Surgeon: Bernarda Caffey, MD;  Location: [place];  Service: Ophthalmology;  Laterality: Left;   INCISION AND DRAINAGE OF WOUND Bilateral 02/11/2016  Procedure: IRRIGATION AND DEBRIDEMENT OF BILATERAL BREAST POCKET;  Surgeon: Wallace Going, DO;  Location: [place];  Service: Plastics;  Laterality: Bilateral;   KNEE ARTHROSCOPY WITH MEDIAL MENISECTOMY Right 12/20/2019   Procedure: KNEE ARTHROSCOPY WITH MEDIAL MENISECTOMY;  Surgeon: Marchia Bond, MD;  Location: [place];  Service: Orthopedics;  Laterality: Right;   KNEE ARTHROSCOPY WITH MEDIAL MENISECTOMY Right 10/30/2021   Procedure: RIGHT KNEE ARTHROSCOPY WITH PARTIAL MEDIAL MENISECTOMY SYNOVECTOMY;  Surgeon: Leandrew Koyanagi, MD;  Location:  [place];  Service: Orthopedics;  Laterality: Right;   LAPAROSCOPIC APPENDECTOMY  04-07-2011   _0    w/ Excision peritoneal lipoma and lysis adhesions   LAPAROSCOPIC CHOLECYSTECTOMY  ~ [place] Right 10/04/2020   Procedure: LASER RETINOPEXY WITH INDIRECT LASER OPTHALMOSCOPE, RIGHT EYE;  Surgeon: Bernarda Caffey, MD;  Location: [place];  Service: Ophthalmology;  Laterality: Right;   LIPOSUCTION WITH LIPOFILLING Bilateral 11/26/2016   Procedure: LIPOFILLING FOR SYMMETRY;  Surgeon: Wallace Going, DO;  Location: [place];  Service: Plastics;  Laterality: Bilateral;   LIPOSUCTION WITH LIPOFILLING Bilateral 01/21/2017   Procedure: BILATERAL BREAST  LIPOFILLING FOR ASYMMETRY;  Surgeon: Wallace Going, DO;  Location: [place];  Service: Plastics;  Laterality: Bilateral;   LIPOSUCTION WITH LIPOFILLING Bilateral 06/28/2020   Procedure: Lipofilling bilateral breasts for asymmetry;  Surgeon: Wallace Going, DO;  Location: [place];  Service: Plastics;  Laterality: Bilateral;  90 min   MASTECTOMY Bilateral 01/09/2016   NIPPLE SPARING MASTECTOMY/SENTINAL LYMPH NODE BIOPSY/RECONSTRUCTION/PLACEMENT OF TISSUE EXPANDER Bilateral 01/09/2016   Procedure: BILATERAL NIPPLE SPARING MASTECTOMY WITH LEFT SENTINAL LYMPH NODE BIOPSY ;  Surgeon: Stark Klein, MD;  Location: [place];  Service: General;  Laterality: Bilateral;   PHOTOCOAGULATION WITH LASER Left 10/04/2020   Procedure: PHOTOCOAGULATION WITH LASER;  Surgeon: Bernarda Caffey, MD;  Location: [place];  Service: Ophthalmology;  Laterality: Left;   REMOVAL OF BILATERAL TISSUE EXPANDERS WITH PLACEMENT OF BILATERAL BREAST IMPLANTS Bilateral 08/20/2016   Procedure: REMOVAL OF BILATERAL TISSUE EXPANDERS WITH PLACEMENT OF BILATERAL SILICONE IMPLANTS;  Surgeon: Wallace Going, DO;  Location: [place];  Service: Plastics;  Laterality: Bilateral;   REMOVAL OF  BILATERAL TISSUE EXPANDERS WITH PLACEMENT OF BILATERAL BREAST IMPLANTS Bilateral 11/05/2017   Procedure: REMOVAL OF BILATERAL TISSUE EXPANDERS WITH PLACEMENT OF BILATERAL BREAST SILICONE IMPLANTS;  Surgeon: Wallace Going, DO;  Location: [place];  Service: Plastics;  Laterality: Bilateral;   REMOVAL OF TISSUE EXPANDER Bilateral 02/11/2016   Procedure: REMOVAL OF BILATERAL TISSUE EXPANDERS AND FLEX HD REMOVAL;  Surgeon: Wallace Going, DO;  Location: [place];  Service: Plastics;  Laterality: Bilateral;   RETINAL DETACHMENT SURGERY Left 10/01/2020   Pneumatic retinopexy for repair of rheg RD - Dr. Bernarda Caffey   RETINAL DETACHMENT SURGERY Left 10/04/2020   PPV - Dr. Bernarda Caffey   SCLERAL BUCKLE Left 10/04/2020   Procedure: SCLERAL BUCKLE LEFT EYE;  Surgeon: Bernarda Caffey, MD;  Location: [place];  Service: Ophthalmology;  Laterality: Left;   TISSUE EXPANDER PLACEMENT Bilateral 08/12/2017   Procedure: PLACEMENT OF BILATERAL TISSUE EXPANDER;  Surgeon: Wallace Going, DO;  Location: [place];  Service: Plastics;  Laterality: Bilateral;   TOTAL KNEE ARTHROPLASTY Right 03/31/2022   Procedure: RIGHT TOTAL KNEE REPLACEMENT;  Surgeon: Leandrew Koyanagi, MD;  Location: [place];  Service: Orthopedics;  Laterality: Right;   TUBAL LIGATION Bilateral 1991  VITRECTOMY 25 GAUGE WITH SCLERAL BUCKLE Left 10/04/2020   Procedure: 25 GAUGE PARS PLANA VITRECTOMY LEFT EYE ;  Surgeon: Bernarda Caffey, MD;  Location: [place];  Service: Ophthalmology;  Laterality: Left;   Patient Active Problem List   Diagnosis Date Noted   DVT (deep venous thrombosis) ([place]) 05/11/2022   Acute pulmonary embolism ([place]) 05/09/2022   Chest pain 05/09/2022   Elevated troponin 05/09/2022   Status post total right knee replacement 03/31/2022   Primary osteoarthritis of right knee 03/20/2022   Eating disorder 03/20/2022   Acute medial meniscus tear, right, initial encounter 10/01/2021    At risk for dehydration 01/28/2021   At risk for malnutrition 01/01/2021   Obesity, Class I, BMI 30-34.9 12/31/2020   Vitamin B12 deficiency 10/08/2020   At risk for depression 10/08/2020   Mood disorder ([place]), with emotional eating 09/24/2020   At risk for impaired metabolic function 62/70/3500   At risk for diabetes mellitus 07/30/2020   Vitamin D deficiency 07/02/2020   Depression 07/02/2020   At risk for side effect of medication 07/02/2020   Stress due to illness of family member-  daughter with drug addiction 05/15/2020   Depression, recurrent ([place]) 05/15/2020   Anemia 05/15/2020   Constipation 05/15/2020   Prediabetes 05/15/2020   Breast asymmetry following reconstructive surgery 04/24/2020   Acquired absence of breast 04/24/2020   S/P breast reconstruction, bilateral 04/24/2020   Acute medial meniscus tear of left knee 12/20/2019   Breast cancer, left ([place]) 01/09/2016   Genetic testing 93/81/8299   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   Family history of breast cancer in female 11/08/2015   Family history of colon cancer 11/08/2015   Malignant neoplasm of central portion of left breast in female, estrogen receptor positive ([place]) 10/31/2015   Endometriosis 03/11/2011    PCP: Maurice Small, MD  REFERRING PROVIDER: Nicholas Lose, MD   REFERRING DIAG: M79.89 (ICD-10-CM) - Right leg swelling   THERAPY DIAG:  Localized edema  Difficulty in walking, not elsewhere classified  History of total right knee replacement  ONSET DATE: 03/31/22  Rationale for Evaluation and Treatment Rehabilitation  SUBJECTIVE:                                                                                                                                                                                           SUBJECTIVE STATEMENT: I took the bandages off this morning. I could not tell any discomfort in my legs but I feel like I can walk better when the bandage is on.   PERTINENT  HISTORY:  Right lower extremity swelling/ lymphedema with known right lower extremity DVT 05/12/22  and hx of right total knee 03/31/22, L breast cancer in 2017 with prophylactic bilateral mastectomy PAIN:  Are you having pain? No  PRECAUTIONS: Other: R TKA 03/31/22  WEIGHT BEARING RESTRICTIONS: No  FALLS:  Has patient fallen in last 6 months? 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  LEISURE: pt does not exercise  HAND DOMINANCE: right   PRIOR LEVEL OF FUNCTION: Independent  PATIENT GOALS: to walk without a cane, to walk where it does not hurt as much   OBJECTIVE:  COGNITION: Overall cognitive status: Within functional limits for tasks assessed   PALPATION: Fibrosis throughout R LE, pitting edema  OBSERVATIONS / OTHER ASSESSMENTS: RLE from knee down approx twice the size of left    LOWER EXTREMITY LANDMARK RIGHT eval 07/04/22 07/07/22  At groin     30 cm proximal to suprapatella     20 cm proximal to suprapatella 55 55 55  10 cm proximal to suprapatella 48 47 47.5  At midpatella / popliteal crease 43.9 44 43  30 cm proximal to floor at lateral plantar foot 39.8 37.5 39.6  20 cm proximal to floor at lateral plantar foot 31.9 31 30.7  10 cm proximal to floor at lateral plantar foot 26.5 24.7 25  Circumference of ankle/heel     5 cm proximal to 1st MTP joint 22._0 Across MTP joint 23.1 23 22.2  Around proximal great toe 8.6 8.6 8.4  (Blank rows = not tested)  LOWER EXTREMITY LANDMARK LEFT eval  At groin   30 cm proximal to suprapatella   20 cm proximal to suprapatella 56.2  10 cm proximal to suprapatella 47.5  At midpatella / popliteal crease 40.5  30 cm proximal to floor at lateral plantar  foot 36.9  20 cm proximal to floor at lateral plantar foot 26.8  10 cm proximal to floor at lateral plantar foot 21.9  Circumference of ankle/heel   5 cm proximal to 1st MTP joint 22.1  Across MTP joint 22.2  Around proximal great toe 8.3  (Blank rows = not tested)  TODAY'S TREATMENT:                                                                                                                           DATE:  07/07/22:   Pt arrived to appt with compression bandages removed because she took a shower this morning. MLD: in supine with head on 2 pillows as follows: short neck, 5 diaphragmatic breaths, R inguinal nodes and then R LE working proximal to distal moving fluid towards inguinal nodes while educating pt on basic principles of MLD.  Compression bandaging to RLE in sitting: TG soft from foot to mid thigh, mollelast to digits  1-5, artiflex from foot to knee, rosidal from just below knee to mid thigh, 1 6cm bandage at foot, 1 8cm bandage at ankle, 1 12 cm from foot to knee, 1 12 cm from lower leg to mid thigh in herringbone, 1 12 cm from lower leg to mid thigh  07/04/22:   Removed bandages, washed pts leg, remeasured LE then reapplied Compression bandaging to RLE in sitting: TG soft from foot to mid thigh, mollelast to digits 1-5, artiflex from foot to knee, rosidal from just below knee to mid thigh, 1 6cm bandage at foot, 1 8cm bandage at ankle, 1 12 cm from foot to knee, 1 12 cm from lower leg to mid thigh in herringbone, 1 12 cm from lower leg to mid thigh  07/02/22- Compression bandaging to RLE in sitting: TG soft from foot to mid thigh, mollelast to digits 1-5, artiflex from foot to knee, rosidal from just below knee to mid thigh, 1 6cm bandage at foot, 1 8cm bandage at ankle, 1 12 cm from foot to knee, 1 12 cm from lower leg to mid thigh in herringbone, 1 12 cm from lower leg to mid thigh  PATIENT EDUCATION:  Education details: causes of edema, how a DVT can lead to prolonged edema in  combination with recent surgery, need for bandaging and then eventually compression garments Person educated: Patient Education method: Explanation Education comprehension: verbalized understanding  HOME EXERCISE PROGRAM: Wear compression bandaging 24/7 and remove if pain or numbness that does not go away when you move  ASSESSMENT:  CLINICAL IMPRESSION: Pt continues to demonstrate reduction throughout most of RLE. Began MLD today to RLE then continued with compression bandaging. Her leg is becoming less fibrotic and there is more tissue mobility.   OBJECTIVE IMPAIRMENTS: decreased knowledge of condition, decreased knowledge of use of DME, increased edema, and pain.   ACTIVITY LIMITATIONS: standing and standing, walking  PARTICIPATION LIMITATIONS: occupation  PERSONAL FACTORS:  none  are also affecting patient's functional outcome.   REHAB POTENTIAL: Excellent  CLINICAL DECISION MAKING: Stable/uncomplicated  EVALUATION COMPLEXITY: Low  GOALS: Goals reviewed with patient? Yes  SHORT TERM GOALS=LONG TERM GOALS Target date: 08/04/2022    Pt will demonstrate a 4 cm decrease in edema 10 cm proximal to lateral malleoli to improve comfort.  Baseline: 26.5cm  Goal status: INITIAL  2.  Pt will obtain appropriate compression garments for long term management of edema.  Baseline:  Goal status: INITIAL  3.  Pt will be independent in self MLD for long term management of edema.  Baseline:  Goal status: INITIAL  4.  Pt will report a 75% improvement in discomfort and pain in RLE to allow improved ability to walk and stand.  Baseline:  Goal status: INITIAL  PLAN:  PT FREQUENCY: 3x/week  PT DURATION: 4 weeks  PLANNED INTERVENTIONS: Therapeutic exercises, Therapeutic activity, Patient/Family education, Self Care, Orthotic/Fit training, Manual lymph drainage, Compression bandaging, Taping, Vasopneumatic device, and Manual therapy  PLAN FOR NEXT SESSION: MLD to RLE followed by  bandaging   Manus Gunning, PT 07/07/2022, 4:07 PM

## 2022-07-09 ENCOUNTER — Ambulatory Visit: Payer: Commercial Managed Care - HMO | Attending: Hematology and Oncology

## 2022-07-09 DIAGNOSIS — R262 Difficulty in walking, not elsewhere classified: Secondary | ICD-10-CM | POA: Diagnosis present

## 2022-07-09 DIAGNOSIS — R600 Localized edema: Secondary | ICD-10-CM | POA: Diagnosis present

## 2022-07-09 DIAGNOSIS — Z96651 Presence of right artificial knee joint: Secondary | ICD-10-CM | POA: Insufficient documentation

## 2022-07-09 NOTE — Therapy (Signed)
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,  multifocal IDC, DCIS, ER/PR+,  01-09-2016 s/p bilteral mastectomies w/ left sln bx;  no chemoradiation   Cataracts, bilateral    Depression    Diabetes mellitus without complication ([place])    type 2   GAD (generalized anxiety disorder)    Gallbladder problem    History of ovarian cyst    IBS (irritable bowel syndrome)    Joint pain    PONV (postoperative nausea and vomiting)    does well with scop patch   Retinal detachment    Rheg OS   Right knee meniscal tear    Urgency of urination    Past Surgical History:  Procedure Laterality Date   ABDOMINAL HYSTERECTOMY  05/1996   endometriosis   ACHILLES TENDON REPAIR Right 2010;  revision 2011   BLADDER SUSPENSION  2000   BREAST BIOPSY Left 10/2015   BREAST IMPLANT REMOVAL Bilateral 08/12/2017   Procedure: REMOVAL BILATERAL BREAST IMPLANTS;  Surgeon: Wallace Going, DO;  Location: [place];  Service: Plastics;  Laterality: Bilateral;   BREAST RECONSTRUCTION WITH PLACEMENT OF TISSUE EXPANDER AND FLEX HD (ACELLULAR HYDRATED DERMIS) Bilateral 01/09/2016   BREAST RECONSTRUCTION WITH PLACEMENT OF TISSUE EXPANDER AND FLEX HD (ACELLULAR HYDRATED DERMIS) Bilateral 01/09/2016   Procedure: BREAST RECONSTRUCTION WITH PLACEMENT OF TISSUE EXPANDER AND FLEX HD (ACELLULAR HYDRATED  DERMIS);  Surgeon: Loel Lofty Dillingham, DO;  Location: [place];  Service: Plastics;  Laterality: Bilateral;   BREAST RECONSTRUCTION WITH PLACEMENT OF TISSUE EXPANDER AND FLEX HD (ACELLULAR HYDRATED DERMIS) Bilateral 05/29/2016   Procedure: PLACEMENT OF BILATERAL TISSUE EXPANDER AND FLEX HD (ACELLULAR HYDRATED DERMIS);  Surgeon: Wallace Going, DO;  Location: [place];  Service: Plastics;  Laterality: Bilateral;   BREAST REDUCTION SURGERY Bilateral 11/26/2016   Procedure: BILATERAL BREAST CAPSULE CONTRACTURE RELASE;  Surgeon: Wallace Going, DO;  Location: [place];  Service: Plastics;  Laterality: Bilateral;   [place]; 1989; [place] Right x3   last one 02-01-2019 @ [facility]   EYE SURGERY Left 10/01/2020   Pneumatic retinopexy for repair of rheg RD - Dr. Bernarda Caffey   EYE SURGERY Left 10/04/2020   PPV - Dr. Bernarda Caffey   FAT GRAFTING BILATERAL BREAST  08-09-2018  _0    GAS INSERTION Left 10/04/2020   Procedure: INSERTION OF GAS;  Surgeon: Bernarda Caffey, MD;  Location: [place];  Service: Ophthalmology;  Laterality: Left;   GAS/FLUID EXCHANGE Left 10/04/2020   Procedure: GAS/FLUID EXCHANGE;  Surgeon: Bernarda Caffey, MD;  Location: [place];  Service: Ophthalmology;  Laterality: Left;   INCISION AND DRAINAGE OF WOUND Bilateral 02/11/2016   Procedure: IRRIGATION AND  DEBRIDEMENT OF BILATERAL BREAST POCKET;  Surgeon: Wallace Going, DO;  Location: [place];  Service: Plastics;  Laterality: Bilateral;   KNEE ARTHROSCOPY WITH MEDIAL MENISECTOMY Right 12/20/2019   Procedure: KNEE ARTHROSCOPY WITH MEDIAL MENISECTOMY;  Surgeon: Marchia Bond, MD;  Location: [place];  Service: Orthopedics;  Laterality: Right;   KNEE ARTHROSCOPY WITH MEDIAL MENISECTOMY Right 10/30/2021   Procedure: RIGHT KNEE ARTHROSCOPY WITH PARTIAL MEDIAL MENISECTOMY SYNOVECTOMY;  Surgeon: Leandrew Koyanagi, MD;  Location: [place];  Service: Orthopedics;  Laterality: Right;   LAPAROSCOPIC APPENDECTOMY  04-07-2011   _0    w/ Excision peritoneal lipoma and lysis adhesions   LAPAROSCOPIC CHOLECYSTECTOMY  ~ [place] Right 10/04/2020   Procedure: LASER RETINOPEXY WITH INDIRECT LASER OPTHALMOSCOPE, RIGHT EYE;  Surgeon: Bernarda Caffey, MD;  Location: [place];  Service: Ophthalmology;  Laterality: Right;   LIPOSUCTION WITH LIPOFILLING Bilateral 11/26/2016   Procedure: LIPOFILLING FOR SYMMETRY;  Surgeon: Wallace Going, DO;  Location: [place];  Service: Plastics;  Laterality: Bilateral;   LIPOSUCTION WITH LIPOFILLING Bilateral 01/21/2017   Procedure: BILATERAL BREAST  LIPOFILLING FOR ASYMMETRY;  Surgeon: Wallace Going, DO;  Location: [place];  Service: Plastics;  Laterality: Bilateral;   LIPOSUCTION WITH LIPOFILLING Bilateral 06/28/2020   Procedure: Lipofilling bilateral breasts for asymmetry;  Surgeon: Wallace Going, DO;  Location: [place];  Service: Plastics;  Laterality: Bilateral;  90 min   MASTECTOMY Bilateral 01/09/2016   NIPPLE SPARING MASTECTOMY/SENTINAL LYMPH NODE BIOPSY/RECONSTRUCTION/PLACEMENT OF TISSUE EXPANDER Bilateral 01/09/2016   Procedure: BILATERAL NIPPLE SPARING MASTECTOMY WITH LEFT SENTINAL LYMPH NODE BIOPSY ;  Surgeon: Stark Klein, MD;  Location: [place];  Service: General;  Laterality: Bilateral;   PHOTOCOAGULATION WITH LASER Left 10/04/2020   Procedure: PHOTOCOAGULATION WITH LASER;  Surgeon: Bernarda Caffey, MD;  Location: [place];  Service: Ophthalmology;  Laterality: Left;   REMOVAL OF BILATERAL TISSUE EXPANDERS WITH PLACEMENT OF BILATERAL BREAST IMPLANTS Bilateral 08/20/2016   Procedure: REMOVAL OF BILATERAL TISSUE EXPANDERS WITH PLACEMENT OF BILATERAL SILICONE IMPLANTS;  Surgeon: Wallace Going, DO;  Location: [place];  Service: Plastics;  Laterality: Bilateral;   REMOVAL OF BILATERAL TISSUE EXPANDERS  WITH PLACEMENT OF BILATERAL BREAST IMPLANTS Bilateral 11/05/2017   Procedure: REMOVAL OF BILATERAL TISSUE EXPANDERS WITH PLACEMENT OF BILATERAL BREAST SILICONE IMPLANTS;  Surgeon: Wallace Going, DO;  Location: [place];  Service: Plastics;  Laterality: Bilateral;   REMOVAL OF TISSUE EXPANDER Bilateral 02/11/2016   Procedure: REMOVAL OF BILATERAL TISSUE EXPANDERS AND FLEX HD REMOVAL;  Surgeon: Wallace Going, DO;  Location: [place];  Service: Plastics;  Laterality: Bilateral;   RETINAL DETACHMENT SURGERY Left 10/01/2020   Pneumatic retinopexy for repair of rheg RD - Dr. Bernarda Caffey   RETINAL DETACHMENT SURGERY Left 10/04/2020   PPV - Dr. Bernarda Caffey   SCLERAL BUCKLE Left 10/04/2020   Procedure: SCLERAL BUCKLE LEFT EYE;  Surgeon: Bernarda Caffey, MD;  Location: [place];  Service: Ophthalmology;  Laterality: Left;   TISSUE EXPANDER PLACEMENT Bilateral 08/12/2017   Procedure: PLACEMENT OF BILATERAL TISSUE EXPANDER;  Surgeon: Wallace Going, DO;  Location: [place];  Service: Plastics;  Laterality: Bilateral;   TOTAL KNEE ARTHROPLASTY Right 03/31/2022   Procedure: RIGHT TOTAL KNEE REPLACEMENT;  Surgeon: Leandrew Koyanagi, MD;  Location: [place];  Service: Orthopedics;  Laterality: Right;   TUBAL LIGATION Bilateral 1991   VITRECTOMY 25 GAUGE  WITH SCLERAL BUCKLE Left 10/04/2020   Procedure: 62 GAUGE PARS PLANA VITRECTOMY LEFT EYE ;  Surgeon: Bernarda Caffey, MD;  Location: [place];  Service: Ophthalmology;  Laterality: Left;   Patient Active Problem List   Diagnosis Date Noted   DVT (deep venous thrombosis) ([place]) 05/11/2022   Acute pulmonary embolism ([place]) 05/09/2022   Chest pain 05/09/2022   Elevated troponin 05/09/2022   Status post total right knee replacement 03/31/2022   Primary osteoarthritis of right knee 03/20/2022   Eating disorder 03/20/2022   Acute medial meniscus tear, right, initial encounter 10/01/2021   At risk for dehydration  01/28/2021   At risk for malnutrition 01/01/2021   Obesity, Class I, BMI 30-34.9 12/31/2020   Vitamin B12 deficiency 10/08/2020   At risk for depression 10/08/2020   Mood disorder ([place]), with emotional eating 09/24/2020   At risk for impaired metabolic function 60/73/7106   At risk for diabetes mellitus 07/30/2020   Vitamin D deficiency 07/02/2020   Depression 07/02/2020   At risk for side effect of medication 07/02/2020   Stress due to illness of family member-  daughter with drug addiction 05/15/2020   Depression, recurrent ([place]) 05/15/2020   Anemia 05/15/2020   Constipation 05/15/2020   Prediabetes 05/15/2020   Breast asymmetry following reconstructive surgery 04/24/2020   Acquired absence of breast 04/24/2020   S/P breast reconstruction, bilateral 04/24/2020   Acute medial meniscus tear of left knee 12/20/2019   Breast cancer, left ([place]) 01/09/2016   Genetic testing 26/94/8546   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   Family history of breast cancer in female 11/08/2015   Family history of colon cancer 11/08/2015   Malignant neoplasm of central portion of left breast in female, estrogen receptor positive ([place]) 10/31/2015   Endometriosis 03/11/2011    PCP: Maurice Small, MD  REFERRING PROVIDER: Nicholas Lose, MD   REFERRING DIAG: M79.89 (ICD-10-CM) - Right leg swelling   THERAPY DIAG:  Localized edema  Difficulty in walking, not elsewhere classified  History of total right knee replacement  ONSET DATE: 03/31/22  Rationale for Evaluation and Treatment Rehabilitation  SUBJECTIVE:                                                                                                                                                                                           SUBJECTIVE STATEMENT: Took bandages off this am to shower around 7:30. It looked good when I took the wraps off, but now it is swollen again.(3:00)  PERTINENT HISTORY:  Right lower extremity swelling/  lymphedema with known right lower extremity DVT 05/12/22 and hx of right total knee 03/31/22, L breast  cancer in 2017 with prophylactic bilateral mastectomy PAIN:  Are you having pain? No, but knee pain with walking  PRECAUTIONS: Other: R TKA 03/31/22  WEIGHT BEARING RESTRICTIONS: No  FALLS:  Has patient fallen in last 6 months? 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  LEISURE: pt does not exercise  HAND DOMINANCE: right   PRIOR LEVEL OF FUNCTION: Independent  PATIENT GOALS: to walk without a cane, to walk where it does not hurt as much   OBJECTIVE:  COGNITION: Overall cognitive status: Within functional limits for tasks assessed   PALPATION: Fibrosis throughout R LE, pitting edema  OBSERVATIONS / OTHER ASSESSMENTS: RLE from knee down approx twice the size of left    LOWER EXTREMITY LANDMARK RIGHT eval 07/04/22 07/07/22  At groin     30 cm proximal to suprapatella     20 cm proximal to suprapatella 55 55 55  10 cm proximal to suprapatella 48 47 47.5  At midpatella / popliteal crease 43.9 44 43  30 cm proximal to floor at lateral plantar foot 39.8 37.5 39.6  20 cm proximal to floor at lateral plantar foot 31.9 31 30.7  10 cm proximal to floor at lateral plantar foot 26.5 24.7 25  Circumference of ankle/heel     5 cm proximal to 1st MTP joint 22._0 Across MTP joint 23.1 23 22.2  Around proximal great toe 8.6 8.6 8.4  (Blank rows = not tested)  LOWER EXTREMITY LANDMARK LEFT eval  At groin   30 cm proximal to suprapatella   20 cm proximal to suprapatella 56.2  10 cm proximal to suprapatella 47.5  At midpatella / popliteal crease 40.5  30 cm proximal to floor at lateral plantar foot 36.9  20  cm proximal to floor at lateral plantar foot 26.8  10 cm proximal to floor at lateral plantar foot 21.9  Circumference of ankle/heel   5 cm proximal to 1st MTP joint 22.1  Across MTP joint 22.2  Around proximal great toe 8.3  (Blank rows = not tested)  TODAY'S TREATMENT:                                                                                                                           DATE:    07/09/2022 Pt arrived to appt with compression bandages removed because she took a shower this morning. MLD: in supine with head on 2 pillows as follows: short neck, 5 diaphragmatic breaths, R inguinal nodes and then R LE working proximal to distal moving fluid towards inguinal nodes while educating pt on basic principles of MLD.  Compression bandaging to RLE in sitting: TG soft from foot to mid thigh, mollelast to digits 1-5, artiflex from foot  to knee, rosidal from just below knee to mid thigh, 1 6cm bandage at foot, 1 8cm bandage at ankle, 1 12 cm from foot to knee, 1 12 cm from lower leg to mid thigh  with spiral, 1 12 cm from lower leg to mid thigh with spiral. Gave pt blue footie for her right LE. 07/07/22:   Pt arrived to appt with compression bandages removed because she took a shower this morning. MLD: in supine with head on 2 pillows as follows: short neck, 5 diaphragmatic breaths, R inguinal nodes and then R LE working proximal to distal moving fluid towards inguinal nodes while educating pt on basic principles of MLD.  Compression bandaging to RLE in sitting: TG soft from foot to mid thigh, mollelast to digits 1-5, artiflex from foot to knee, rosidal from just below knee to mid thigh, 1 6cm bandage at foot, 1 8cm bandage at ankle, 1 12 cm from foot to knee, 1 12 cm from lower leg to mid thigh in [place]ringbone, 1 12 cm from lower leg to mid thigh  07/04/22:   Removed bandages, washed pts leg, remeasured LE then reapplied Compression bandaging to RLE in sitting: TG soft from foot to mid thigh,  mollelast to digits 1-5, artiflex from foot to knee, rosidal from just below knee to mid thigh, 1 6cm bandage at foot, 1 8cm bandage at ankle, 1 12 cm from foot to knee, 1 12 cm from lower leg to mid thigh in herringbone, 1 12 cm from lower leg to mid thigh  07/02/22- Compression bandaging to RLE in sitting: TG soft from foot to mid thigh, mollelast to digits 1-5, artiflex from foot to knee, rosidal from just below knee to mid thigh, 1 6cm bandage at foot, 1 8cm bandage at ankle, 1 12 cm from foot to knee, 1 12 cm from lower leg to mid thigh in herringbone, 1 12 cm from lower leg to mid thigh  PATIENT EDUCATION:  Education details: causes of edema, how a DVT can lead to prolonged edema in combination with recent surgery, need for bandaging and then eventually compression garments Person educated: Patient Education method: Explanation Education comprehension: verbalized understanding  HOME EXERCISE PROGRAM: Wear compression bandaging 24/7 and remove if pain or numbness that does not go away when you move  ASSESSMENT:  CLINICAL IMPRESSION: Pt noted excellent reduction in edema this am when she removed her wraps, but by 3:00 her leg had swollen again. Pt advised to try and remain in wraps unless she has therapy in the morning or her leg will be more swollen. Pt indicated wraps were comfortable when she left.    OBJECTIVE IMPAIRMENTS: decreased knowledge of condition, decreased knowledge of use of DME, increased edema, and pain.   ACTIVITY LIMITATIONS: standing and standing, walking  PARTICIPATION LIMITATIONS: occupation  PERSONAL FACTORS:  none  are also affecting patient's functional outcome.   REHAB POTENTIAL: Excellent  CLINICAL DECISION MAKING: Stable/uncomplicated  EVALUATION COMPLEXITY: Low  GOALS: Goals reviewed with patient? Yes  SHORT TERM GOALS=LONG TERM GOALS Target date: 08/06/2022    Pt will demonstrate a 4 cm decrease in edema 10 cm proximal to lateral malleoli to  improve comfort.  Baseline: 26.5cm  Goal status: INITIAL  2.  Pt will obtain appropriate compression garments for long term management of edema.  Baseline:  Goal status: INITIAL  3.  Pt will be independent in self MLD for long term management of edema.  Baseline:  Goal status: INITIAL  4.  Pt  will report a 75% improvement in discomfort and pain in RLE to allow improved ability to walk and stand.  Baseline:  Goal status: INITIAL  PLAN:  PT FREQUENCY: 3x/week  PT DURATION: 4 weeks  PLANNED INTERVENTIONS: Therapeutic exercises, Therapeutic activity, Patient/Family education, Self Care, Orthotic/Fit training, Manual lymph drainage, Compression bandaging, Taping, Vasopneumatic device, and Manual therapy  PLAN FOR NEXT SESSION: MLD to RLE followed by bandaging   Claris Pong, PT 07/09/2022, 5:20 PM

## 2022-07-11 ENCOUNTER — Encounter: Payer: Self-pay | Admitting: Physical Therapy

## 2022-07-11 ENCOUNTER — Ambulatory Visit: Payer: Commercial Managed Care - HMO | Admitting: Physical Therapy

## 2022-07-11 DIAGNOSIS — Z96651 Presence of right artificial knee joint: Secondary | ICD-10-CM

## 2022-07-11 DIAGNOSIS — R262 Difficulty in walking, not elsewhere classified: Secondary | ICD-10-CM

## 2022-07-11 DIAGNOSIS — R600 Localized edema: Secondary | ICD-10-CM

## 2022-07-11 NOTE — Therapy (Signed)
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,  multifocal IDC, DCIS, ER/PR+,  01-09-2016 s/p bilteral mastectomies w/ left sln bx;  no chemoradiation   Cataracts, bilateral    Depression    Diabetes mellitus without complication ([place])    type 2   GAD (generalized anxiety disorder)    Gallbladder problem    History of ovarian cyst    IBS (irritable bowel syndrome)    Joint pain    PONV (postoperative nausea and vomiting)    does well with scop patch   Retinal detachment    Rheg OS   Right knee meniscal tear    Urgency of urination    Past Surgical History:  Procedure Laterality Date   ABDOMINAL HYSTERECTOMY  05/1996   endometriosis   ACHILLES TENDON REPAIR Right 2010;  revision 2011   BLADDER SUSPENSION  2000   BREAST BIOPSY Left 10/2015   BREAST IMPLANT REMOVAL Bilateral 08/12/2017   Procedure: REMOVAL BILATERAL BREAST IMPLANTS;  Surgeon: Wallace Going, DO;  Location: [place];  Service: Plastics;  Laterality: Bilateral;   BREAST RECONSTRUCTION WITH PLACEMENT OF TISSUE EXPANDER AND FLEX HD (ACELLULAR HYDRATED DERMIS) Bilateral 01/09/2016   BREAST RECONSTRUCTION WITH PLACEMENT OF TISSUE EXPANDER AND FLEX HD (ACELLULAR HYDRATED DERMIS) Bilateral 01/09/2016   Procedure: BREAST RECONSTRUCTION WITH PLACEMENT OF TISSUE EXPANDER AND FLEX HD (ACELLULAR HYDRATED  DERMIS);  Surgeon: Loel Lofty Dillingham, DO;  Location: [place];  Service: Plastics;  Laterality: Bilateral;   BREAST RECONSTRUCTION WITH PLACEMENT OF TISSUE EXPANDER AND FLEX HD (ACELLULAR HYDRATED DERMIS) Bilateral 05/29/2016   Procedure: PLACEMENT OF BILATERAL TISSUE EXPANDER AND FLEX HD (ACELLULAR HYDRATED DERMIS);  Surgeon: Wallace Going, DO;  Location: [place];  Service: Plastics;  Laterality: Bilateral;   BREAST REDUCTION SURGERY Bilateral 11/26/2016   Procedure: BILATERAL BREAST CAPSULE CONTRACTURE RELASE;  Surgeon: Wallace Going, DO;  Location: [place];  Service: Plastics;  Laterality: Bilateral;   [place]; 1989; [place] Right x3   last one 02-01-2019 @ [facility]   EYE SURGERY Left 10/01/2020   Pneumatic retinopexy for repair of rheg RD - Dr. Bernarda Caffey   EYE SURGERY Left 10/04/2020   PPV - Dr. Bernarda Caffey   FAT GRAFTING BILATERAL BREAST  08-09-2018  _0    GAS INSERTION Left 10/04/2020   Procedure: INSERTION OF GAS;  Surgeon: Bernarda Caffey, MD;  Location: [place];  Service: Ophthalmology;  Laterality: Left;   GAS/FLUID EXCHANGE Left 10/04/2020   Procedure: GAS/FLUID EXCHANGE;  Surgeon: Bernarda Caffey, MD;  Location: [place];  Service: Ophthalmology;  Laterality: Left;   INCISION AND DRAINAGE OF WOUND Bilateral 02/11/2016   Procedure: IRRIGATION  AND DEBRIDEMENT OF BILATERAL BREAST POCKET;  Surgeon: Wallace Going, DO;  Location: [place];  Service: Plastics;  Laterality: Bilateral;   KNEE ARTHROSCOPY WITH MEDIAL MENISECTOMY Right 12/20/2019   Procedure: KNEE ARTHROSCOPY WITH MEDIAL MENISECTOMY;  Surgeon: Marchia Bond, MD;  Location: [place];  Service: Orthopedics;  Laterality: Right;   KNEE ARTHROSCOPY WITH MEDIAL MENISECTOMY Right 10/30/2021   Procedure: RIGHT KNEE ARTHROSCOPY WITH PARTIAL MEDIAL MENISECTOMY SYNOVECTOMY;  Surgeon: Leandrew Koyanagi, MD;  Location: [place];  Service: Orthopedics;  Laterality: Right;   LAPAROSCOPIC APPENDECTOMY  04-07-2011   _0    w/ Excision peritoneal lipoma and lysis adhesions   LAPAROSCOPIC CHOLECYSTECTOMY  ~ [place] Right 10/04/2020   Procedure: LASER RETINOPEXY WITH INDIRECT LASER OPTHALMOSCOPE, RIGHT EYE;  Surgeon: Bernarda Caffey, MD;  Location: [place];  Service: Ophthalmology;  Laterality: Right;   LIPOSUCTION WITH LIPOFILLING Bilateral 11/26/2016   Procedure: LIPOFILLING FOR SYMMETRY;  Surgeon: Wallace Going, DO;  Location: [place];  Service: Plastics;  Laterality: Bilateral;   LIPOSUCTION WITH LIPOFILLING Bilateral 01/21/2017   Procedure: BILATERAL BREAST  LIPOFILLING FOR ASYMMETRY;  Surgeon: Wallace Going, DO;  Location: [place];  Service: Plastics;  Laterality: Bilateral;   LIPOSUCTION WITH LIPOFILLING Bilateral 06/28/2020   Procedure: Lipofilling bilateral breasts for asymmetry;  Surgeon: Wallace Going, DO;  Location: [place];  Service: Plastics;  Laterality: Bilateral;  90 min   MASTECTOMY Bilateral 01/09/2016   NIPPLE SPARING MASTECTOMY/SENTINAL LYMPH NODE BIOPSY/RECONSTRUCTION/PLACEMENT OF TISSUE EXPANDER Bilateral 01/09/2016   Procedure: BILATERAL NIPPLE SPARING MASTECTOMY WITH LEFT SENTINAL LYMPH NODE BIOPSY ;  Surgeon: Stark Klein, MD;  Location: [place];  Service: General;  Laterality: Bilateral;   PHOTOCOAGULATION WITH LASER Left 10/04/2020   Procedure: PHOTOCOAGULATION WITH LASER;  Surgeon: Bernarda Caffey, MD;  Location: [place];  Service: Ophthalmology;  Laterality: Left;   REMOVAL OF BILATERAL TISSUE EXPANDERS WITH PLACEMENT OF BILATERAL BREAST IMPLANTS Bilateral 08/20/2016   Procedure: REMOVAL OF BILATERAL TISSUE EXPANDERS WITH PLACEMENT OF BILATERAL SILICONE IMPLANTS;  Surgeon: Wallace Going, DO;  Location: [place];  Service: Plastics;  Laterality: Bilateral;   REMOVAL OF BILATERAL TISSUE EXPANDERS  WITH PLACEMENT OF BILATERAL BREAST IMPLANTS Bilateral 11/05/2017   Procedure: REMOVAL OF BILATERAL TISSUE EXPANDERS WITH PLACEMENT OF BILATERAL BREAST SILICONE IMPLANTS;  Surgeon: Wallace Going, DO;  Location: [place];  Service: Plastics;  Laterality: Bilateral;   REMOVAL OF TISSUE EXPANDER Bilateral 02/11/2016   Procedure: REMOVAL OF BILATERAL TISSUE EXPANDERS AND FLEX HD REMOVAL;  Surgeon: Wallace Going, DO;  Location: [place];  Service: Plastics;  Laterality: Bilateral;   RETINAL DETACHMENT SURGERY Left 10/01/2020   Pneumatic retinopexy for repair of rheg RD - Dr. Bernarda Caffey   RETINAL DETACHMENT SURGERY Left 10/04/2020   PPV - Dr. Bernarda Caffey   SCLERAL BUCKLE Left 10/04/2020   Procedure: SCLERAL BUCKLE LEFT EYE;  Surgeon: Bernarda Caffey, MD;  Location: [place];  Service: Ophthalmology;  Laterality: Left;   TISSUE EXPANDER PLACEMENT Bilateral 08/12/2017   Procedure: PLACEMENT OF BILATERAL TISSUE EXPANDER;  Surgeon: Wallace Going, DO;  Location: [place];  Service: Plastics;  Laterality: Bilateral;   TOTAL KNEE ARTHROPLASTY Right 03/31/2022   Procedure: RIGHT TOTAL KNEE REPLACEMENT;  Surgeon: Leandrew Koyanagi, MD;  Location: [place];  Service: Orthopedics;  Laterality: Right;   TUBAL LIGATION Bilateral 1991   VITRECTOMY 25  GAUGE WITH SCLERAL BUCKLE Left 10/04/2020   Procedure: 67 GAUGE PARS PLANA VITRECTOMY LEFT EYE ;  Surgeon: Bernarda Caffey, MD;  Location: [place];  Service: Ophthalmology;  Laterality: Left;   Patient Active Problem List   Diagnosis Date Noted   DVT (deep venous thrombosis) ([place]) 05/11/2022   Acute pulmonary embolism ([place]) 05/09/2022   Chest pain 05/09/2022   Elevated troponin 05/09/2022   Status post total right knee replacement 03/31/2022   Primary osteoarthritis of right knee 03/20/2022   Eating disorder 03/20/2022   Acute medial meniscus tear, right, initial encounter 10/01/2021   At risk for dehydration  01/28/2021   At risk for malnutrition 01/01/2021   Obesity, Class I, BMI 30-34.9 12/31/2020   Vitamin B12 deficiency 10/08/2020   At risk for depression 10/08/2020   Mood disorder ([place]), with emotional eating 09/24/2020   At risk for impaired metabolic function 07/02/8526   At risk for diabetes mellitus 07/30/2020   Vitamin D deficiency 07/02/2020   Depression 07/02/2020   At risk for side effect of medication 07/02/2020   Stress due to illness of family member-  daughter with drug addiction 05/15/2020   Depression, recurrent ([place]) 05/15/2020   Anemia 05/15/2020   Constipation 05/15/2020   Prediabetes 05/15/2020   Breast asymmetry following reconstructive surgery 04/24/2020   Acquired absence of breast 04/24/2020   S/P breast reconstruction, bilateral 04/24/2020   Acute medial meniscus tear of left knee 12/20/2019   Breast cancer, left ([place]) 01/09/2016   Genetic testing 78/24/2353   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   Family history of breast cancer in female 11/08/2015   Family history of colon cancer 11/08/2015   Malignant neoplasm of central portion of left breast in female, estrogen receptor positive ([place]) 10/31/2015   Endometriosis 03/11/2011    PCP: Maurice Small, MD  REFERRING PROVIDER: Nicholas Lose, MD   REFERRING DIAG: M79.89 (ICD-10-CM) - Right leg swelling   THERAPY DIAG:  Localized edema  Difficulty in walking, not elsewhere classified  History of total right knee replacement  ONSET DATE: 03/31/22  Rationale for Evaluation and Treatment Rehabilitation  SUBJECTIVE:                                                                                                                                                                                           SUBJECTIVE STATEMENT: I left the bandages on. I did not have any issues with them.   PERTINENT HISTORY:  Right lower extremity swelling/ lymphedema with known right lower extremity DVT 05/12/22 and hx of  right total knee 03/31/22, L breast cancer in 2017 with prophylactic bilateral mastectomy PAIN:  Are you having pain? No, but knee pain with walking  PRECAUTIONS: Other: R TKA 03/31/22  WEIGHT BEARING RESTRICTIONS: No  FALLS:  Has patient fallen in last 6 months? 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  LEISURE: pt does not exercise  HAND DOMINANCE: right   PRIOR LEVEL OF FUNCTION: Independent  PATIENT GOALS: to walk without a cane, to walk where it does not hurt as much   OBJECTIVE:  COGNITION: Overall cognitive status: Within functional limits for tasks assessed   PALPATION: Fibrosis throughout R LE, pitting edema  OBSERVATIONS / OTHER ASSESSMENTS: RLE from knee down approx twice the size of left    LOWER EXTREMITY LANDMARK RIGHT eval 07/04/22 07/07/22 07/11/22  At groin      30 cm proximal to suprapatella      20 cm proximal to suprapatella 55 55 55 55.4  10 cm proximal to suprapatella 48 47 47.5 46.6  At midpatella / popliteal crease 43.9 44 43 42  30 cm proximal to floor at lateral plantar foot 39.8 37.5 39.6 36.5  20 cm proximal to floor at lateral plantar foot 31.9 31 30.7 28.6  10 cm proximal to floor at lateral plantar foot 26.5 24.7 25 22.6  Circumference of ankle/heel      5 cm proximal to 1st MTP joint 22._0 21.9  Across MTP joint 23.1 23 22.2 22  Around proximal great toe 8.6 8.6 8.4 8.4  (Blank rows = not tested)  LOWER EXTREMITY LANDMARK LEFT eval  At groin   30 cm proximal to suprapatella   20 cm proximal to suprapatella 56.2  10 cm proximal to suprapatella 47.5  At midpatella / popliteal crease 40.5  30 cm proximal to floor at lateral plantar foot 36.9  20 cm proximal to  floor at lateral plantar foot 26.8  10 cm proximal to floor at lateral plantar foot 21.9  Circumference of ankle/heel   5 cm proximal to 1st MTP joint 22.1  Across MTP joint 22.2  Around proximal great toe 8.3  (Blank rows = not tested)  TODAY'S TREATMENT:                                                                                                                           DATE:  07/11/2022 Removed bandages. Washed leg with soap and water and remeasured circumferences.  MLD: in supine with head on 2 pillows as follows: short neck, 5 diaphragmatic breaths, R inguinal nodes and then R LE working proximal to distal moving fluid towards inguinal nodes while educating pt on basic principles of MLD.  Compression bandaging to RLE in sitting: TG soft from foot to mid thigh, mollelast to digits 1-5, artiflex from foot to  knee, rosidal from just below knee to mid thigh, 1 6cm bandage at foot, 1 8cm bandage at ankle, 1 12 cm from foot to knee, 1 12 cm from lower leg to mid thigh  with spiral, 1 12 cm from lower leg to mid thigh with spiral. Pt donned sock and sandal.   07/09/2022 Pt arrived to appt with compression bandages removed because she took a shower this morning. MLD: in supine with head on 2 pillows as follows: short neck, 5 diaphragmatic breaths, R inguinal nodes and then R LE working proximal to distal moving fluid towards inguinal nodes while educating pt on basic principles of MLD.  Compression bandaging to RLE in sitting: TG soft from foot to mid thigh, mollelast to digits 1-5, artiflex from foot to knee, rosidal from just below knee to mid thigh, 1 6cm bandage at foot, 1 8cm bandage at ankle, 1 12 cm from foot to knee, 1 12 cm from lower leg to mid thigh  with spiral, 1 12 cm from lower leg to mid thigh with spiral. Gave pt blue footie for her right LE. 07/07/22:   Pt arrived to appt with compression bandages removed because she took a shower this morning. MLD: in supine with head on 2  pillows as follows: short neck, 5 diaphragmatic breaths, R inguinal nodes and then R LE working proximal to distal moving fluid towards inguinal nodes while educating pt on basic principles of MLD.  Compression bandaging to RLE in sitting: TG soft from foot to mid thigh, mollelast to digits 1-5, artiflex from foot to knee, rosidal from just below knee to mid thigh, 1 6cm bandage at foot, 1 8cm bandage at ankle, 1 12 cm from foot to knee, 1 12 cm from lower leg to mid thigh in [place]bone, 1 12 cm from lower leg to mid thigh  07/04/22:   Removed bandages, washed pts leg, remeasured LE then reapplied Compression bandaging to RLE in sitting: TG soft from foot to mid thigh, mollelast to digits 1-5, artiflex from foot to knee, rosidal from just below knee to mid thigh, 1 6cm bandage at foot, 1 8cm bandage at ankle, 1 12 cm from foot to knee, 1 12 cm from lower leg to mid thigh in herringbone, 1 12 cm from lower leg to mid thigh  07/02/22- Compression bandaging to RLE in sitting: TG soft from foot to mid thigh, mollelast to digits 1-5, artiflex from foot to knee, rosidal from just below knee to mid thigh, 1 6cm bandage at foot, 1 8cm bandage at ankle, 1 12 cm from foot to knee, 1 12 cm from lower leg to mid thigh in [place]ringbone, 1 12 cm from lower leg to mid thigh  PATIENT EDUCATION:  Education details: causes of edema, how a DVT can lead to prolonged edema in combination with recent surgery, need for bandaging and then eventually compression garments Person educated: Patient Education method: Explanation Education comprehension: verbalized understanding  HOME EXERCISE PROGRAM: Wear compression bandaging 24/7 and remove if pain or numbness that does not go away when you move  ASSESSMENT:  CLINICAL IMPRESSION: Remeasured pt's RLE circumferences today and they have decreased about 2-3 cm throughout. Pt's circumferences are almost the same size as the LLE. Her knee and lower leg is still slightly larger  so continued with bandaging. Will measure her for compression garments at next session.   OBJECTIVE IMPAIRMENTS: decreased knowledge of condition, decreased knowledge of use of DME, increased edema, and pain.   ACTIVITY LIMITATIONS:  standing and standing, walking  PARTICIPATION LIMITATIONS: occupation  PERSONAL FACTORS:  none  are also affecting patient's functional outcome.   REHAB POTENTIAL: Excellent  CLINICAL DECISION MAKING: Stable/uncomplicated  EVALUATION COMPLEXITY: Low  GOALS: Goals reviewed with patient? Yes  SHORT TERM GOALS=LONG TERM GOALS Target date: 08/08/2022    Pt will demonstrate a 4 cm decrease in edema 10 cm proximal to lateral malleoli to improve comfort.  Baseline: 26.5cm  Goal status: INITIAL  2.  Pt will obtain appropriate compression garments for long term management of edema.  Baseline:  Goal status: INITIAL  3.  Pt will be independent in self MLD for long term management of edema.  Baseline:  Goal status: INITIAL  4.  Pt will report a 75% improvement in discomfort and pain in RLE to allow improved ability to walk and stand.  Baseline:  Goal status: INITIAL  PLAN:  PT FREQUENCY: 3x/week  PT DURATION: 4 weeks  PLANNED INTERVENTIONS: Therapeutic exercises, Therapeutic activity, Patient/Family education, Self Care, Orthotic/Fit training, Manual lymph drainage, Compression bandaging, Taping, Vasopneumatic device, and Manual therapy  PLAN FOR NEXT SESSION: measure for compression garments, ExoStrong thigh high?  MLD to RLE followed by bandaging   Manus Gunning, PT 07/11/2022, 10:08 AM

## 2022-07-14 ENCOUNTER — Ambulatory Visit: Payer: Commercial Managed Care - HMO

## 2022-07-14 DIAGNOSIS — R262 Difficulty in walking, not elsewhere classified: Secondary | ICD-10-CM

## 2022-07-14 DIAGNOSIS — Z96651 Presence of right artificial knee joint: Secondary | ICD-10-CM

## 2022-07-14 DIAGNOSIS — R600 Localized edema: Secondary | ICD-10-CM | POA: Diagnosis not present

## 2022-07-14 NOTE — Therapy (Signed)
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,  multifocal IDC, DCIS, ER/PR+,  01-09-2016 s/p bilteral mastectomies w/ left sln bx;  no chemoradiation   Cataracts, bilateral    Depression    Diabetes mellitus without complication ([place])    type 2   GAD (generalized anxiety disorder)    Gallbladder problem    History of ovarian cyst    IBS (irritable bowel syndrome)    Joint pain    PONV (postoperative nausea and vomiting)    does well with scop patch   Retinal detachment    Rheg OS   Right knee meniscal tear    Urgency of urination    Past Surgical History:  Procedure Laterality Date   ABDOMINAL HYSTERECTOMY  05/1996   endometriosis   ACHILLES TENDON REPAIR Right 2010;  revision 2011   BLADDER SUSPENSION  2000   BREAST BIOPSY Left 10/2015   BREAST IMPLANT REMOVAL Bilateral 08/12/2017   Procedure: REMOVAL BILATERAL BREAST IMPLANTS;  Surgeon: Wallace Going, DO;  Location: [place];  Service: Plastics;  Laterality: Bilateral;   BREAST RECONSTRUCTION WITH PLACEMENT OF TISSUE EXPANDER AND FLEX HD (ACELLULAR HYDRATED DERMIS) Bilateral 01/09/2016   BREAST RECONSTRUCTION WITH PLACEMENT OF TISSUE EXPANDER AND FLEX HD (ACELLULAR HYDRATED DERMIS) Bilateral 01/09/2016   Procedure: BREAST RECONSTRUCTION WITH PLACEMENT OF TISSUE EXPANDER AND FLEX HD (ACELLULAR HYDRATED  DERMIS);  Surgeon: Loel Lofty Dillingham, DO;  Location: [place];  Service: Plastics;  Laterality: Bilateral;   BREAST RECONSTRUCTION WITH PLACEMENT OF TISSUE EXPANDER AND FLEX HD (ACELLULAR HYDRATED DERMIS) Bilateral 05/29/2016   Procedure: PLACEMENT OF BILATERAL TISSUE EXPANDER AND FLEX HD (ACELLULAR HYDRATED DERMIS);  Surgeon: Wallace Going, DO;  Location: [place];  Service: Plastics;  Laterality: Bilateral;   BREAST REDUCTION SURGERY Bilateral 11/26/2016   Procedure: BILATERAL BREAST CAPSULE CONTRACTURE RELASE;  Surgeon: Wallace Going, DO;  Location: [place];  Service: Plastics;  Laterality: Bilateral;   [place]; 1989; [place] Right x3   last one 02-01-2019 @ [facility]   EYE SURGERY Left 10/01/2020   Pneumatic retinopexy for repair of rheg RD - Dr. Bernarda Caffey   EYE SURGERY Left 10/04/2020   PPV - Dr. Bernarda Caffey   FAT GRAFTING BILATERAL BREAST  08-09-2018  _0    GAS INSERTION Left 10/04/2020   Procedure: INSERTION OF GAS;  Surgeon: Bernarda Caffey, MD;  Location: [place];  Service: Ophthalmology;  Laterality: Left;   GAS/FLUID EXCHANGE Left 10/04/2020   Procedure: GAS/FLUID EXCHANGE;  Surgeon: Bernarda Caffey, MD;  Location: [place];  Service: Ophthalmology;  Laterality: Left;   INCISION AND DRAINAGE OF WOUND Bilateral 02/11/2016   Procedure: IRRIGATION  AND DEBRIDEMENT OF BILATERAL BREAST POCKET;  Surgeon: Wallace Going, DO;  Location: [place];  Service: Plastics;  Laterality: Bilateral;   KNEE ARTHROSCOPY WITH MEDIAL MENISECTOMY Right 12/20/2019   Procedure: KNEE ARTHROSCOPY WITH MEDIAL MENISECTOMY;  Surgeon: Marchia Bond, MD;  Location: [place];  Service: Orthopedics;  Laterality: Right;   KNEE ARTHROSCOPY WITH MEDIAL MENISECTOMY Right 10/30/2021   Procedure: RIGHT KNEE ARTHROSCOPY WITH PARTIAL MEDIAL MENISECTOMY SYNOVECTOMY;  Surgeon: Leandrew Koyanagi, MD;  Location: [place];  Service: Orthopedics;  Laterality: Right;   LAPAROSCOPIC APPENDECTOMY  04-07-2011   _0    w/ Excision peritoneal lipoma and lysis adhesions   LAPAROSCOPIC CHOLECYSTECTOMY  ~ [place] Right 10/04/2020   Procedure: LASER RETINOPEXY WITH INDIRECT LASER OPTHALMOSCOPE, RIGHT EYE;  Surgeon: Bernarda Caffey, MD;  Location: [place];  Service: Ophthalmology;  Laterality: Right;   LIPOSUCTION WITH LIPOFILLING Bilateral 11/26/2016   Procedure: LIPOFILLING FOR SYMMETRY;  Surgeon: Wallace Going, DO;  Location: [place];  Service: Plastics;  Laterality: Bilateral;   LIPOSUCTION WITH LIPOFILLING Bilateral 01/21/2017   Procedure: BILATERAL BREAST  LIPOFILLING FOR ASYMMETRY;  Surgeon: Wallace Going, DO;  Location: [place];  Service: Plastics;  Laterality: Bilateral;   LIPOSUCTION WITH LIPOFILLING Bilateral 06/28/2020   Procedure: Lipofilling bilateral breasts for asymmetry;  Surgeon: Wallace Going, DO;  Location: [place];  Service: Plastics;  Laterality: Bilateral;  90 min   MASTECTOMY Bilateral 01/09/2016   NIPPLE SPARING MASTECTOMY/SENTINAL LYMPH NODE BIOPSY/RECONSTRUCTION/PLACEMENT OF TISSUE EXPANDER Bilateral 01/09/2016   Procedure: BILATERAL NIPPLE SPARING MASTECTOMY WITH LEFT SENTINAL LYMPH NODE BIOPSY ;  Surgeon: Stark Klein, MD;  Location: [place];  Service: General;  Laterality: Bilateral;   PHOTOCOAGULATION WITH LASER Left 10/04/2020   Procedure: PHOTOCOAGULATION WITH LASER;  Surgeon: Bernarda Caffey, MD;  Location: [place];  Service: Ophthalmology;  Laterality: Left;   REMOVAL OF BILATERAL TISSUE EXPANDERS WITH PLACEMENT OF BILATERAL BREAST IMPLANTS Bilateral 08/20/2016   Procedure: REMOVAL OF BILATERAL TISSUE EXPANDERS WITH PLACEMENT OF BILATERAL SILICONE IMPLANTS;  Surgeon: Wallace Going, DO;  Location: [place];  Service: Plastics;  Laterality: Bilateral;   REMOVAL OF BILATERAL TISSUE EXPANDERS  WITH PLACEMENT OF BILATERAL BREAST IMPLANTS Bilateral 11/05/2017   Procedure: REMOVAL OF BILATERAL TISSUE EXPANDERS WITH PLACEMENT OF BILATERAL BREAST SILICONE IMPLANTS;  Surgeon: Wallace Going, DO;  Location: [place];  Service: Plastics;  Laterality: Bilateral;   REMOVAL OF TISSUE EXPANDER Bilateral 02/11/2016   Procedure: REMOVAL OF BILATERAL TISSUE EXPANDERS AND FLEX HD REMOVAL;  Surgeon: Wallace Going, DO;  Location: [place];  Service: Plastics;  Laterality: Bilateral;   RETINAL DETACHMENT SURGERY Left 10/01/2020   Pneumatic retinopexy for repair of rheg RD - Dr. Bernarda Caffey   RETINAL DETACHMENT SURGERY Left 10/04/2020   PPV - Dr. Bernarda Caffey   SCLERAL BUCKLE Left 10/04/2020   Procedure: SCLERAL BUCKLE LEFT EYE;  Surgeon: Bernarda Caffey, MD;  Location: [place];  Service: Ophthalmology;  Laterality: Left;   TISSUE EXPANDER PLACEMENT Bilateral 08/12/2017   Procedure: PLACEMENT OF BILATERAL TISSUE EXPANDER;  Surgeon: Wallace Going, DO;  Location: [place];  Service: Plastics;  Laterality: Bilateral;   TOTAL KNEE ARTHROPLASTY Right 03/31/2022   Procedure: RIGHT TOTAL KNEE REPLACEMENT;  Surgeon: Leandrew Koyanagi, MD;  Location: [place];  Service: Orthopedics;  Laterality: Right;   TUBAL LIGATION Bilateral 1991   VITRECTOMY 25  GAUGE WITH SCLERAL BUCKLE Left 10/04/2020   Procedure: 16 GAUGE PARS PLANA VITRECTOMY LEFT EYE ;  Surgeon: Bernarda Caffey, MD;  Location: [place];  Service: Ophthalmology;  Laterality: Left;   Patient Active Problem List   Diagnosis Date Noted   DVT (deep venous thrombosis) ([place]) 05/11/2022   Acute pulmonary embolism ([place]) 05/09/2022   Chest pain 05/09/2022   Elevated troponin 05/09/2022   Status post total right knee replacement 03/31/2022   Primary osteoarthritis of right knee 03/20/2022   Eating disorder 03/20/2022   Acute medial meniscus tear, right, initial encounter 10/01/2021   At risk for dehydration  01/28/2021   At risk for malnutrition 01/01/2021   Obesity, Class I, BMI 30-34.9 12/31/2020   Vitamin B12 deficiency 10/08/2020   At risk for depression 10/08/2020   Mood disorder ([place]), with emotional eating 09/24/2020   At risk for impaired metabolic function 43/15/4008   At risk for diabetes mellitus 07/30/2020   Vitamin D deficiency 07/02/2020   Depression 07/02/2020   At risk for side effect of medication 07/02/2020   Stress due to illness of family member-  daughter with drug addiction 05/15/2020   Depression, recurrent ([place]) 05/15/2020   Anemia 05/15/2020   Constipation 05/15/2020   Prediabetes 05/15/2020   Breast asymmetry following reconstructive surgery 04/24/2020   Acquired absence of breast 04/24/2020   S/P breast reconstruction, bilateral 04/24/2020   Acute medial meniscus tear of left knee 12/20/2019   Breast cancer, left ([place]) 01/09/2016   Genetic testing 67/61/9509   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   Family history of breast cancer in female 11/08/2015   Family history of colon cancer 11/08/2015   Malignant neoplasm of central portion of left breast in female, estrogen receptor positive ([place]) 10/31/2015   Endometriosis 03/11/2011    PCP: Maurice Small, MD  REFERRING PROVIDER: Nicholas Lose, MD   REFERRING DIAG: M79.89 (ICD-10-CM) - Right leg swelling   THERAPY DIAG:  Localized edema  Difficulty in walking, not elsewhere classified  History of total right knee replacement  ONSET DATE: 03/31/22  Rationale for Evaluation and Treatment Rehabilitation  SUBJECTIVE:                                                                                                                                                                                           SUBJECTIVE STATEMENT: I left the bandages on until this morning when I got in the shower. They stayed on pretty good all weekend. My Rt knee pain is some improved when I'm in the bandages.   PERTINENT  HISTORY:  Right lower extremity swelling/ lymphedema with known right lower extremity  DVT 05/12/22 and hx of right total knee 03/31/22, L breast cancer in 2017 with prophylactic bilateral mastectomy PAIN:  Are you having pain? No, but knee pain with walking up to 8/10, less so when bandaged, down to a 4/10  PRECAUTIONS: Other: R TKA 03/31/22  WEIGHT BEARING RESTRICTIONS: No  FALLS:  Has patient fallen in last 6 months? 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  LEISURE: pt does not exercise  HAND DOMINANCE: right   PRIOR LEVEL OF FUNCTION: Independent  PATIENT GOALS: to walk without a cane, to walk where it does not hurt as much   OBJECTIVE:  COGNITION: Overall cognitive status: Within functional limits for tasks assessed   PALPATION: Fibrosis throughout R LE, pitting edema  OBSERVATIONS / OTHER ASSESSMENTS: RLE from knee down approx twice the size of left    LOWER EXTREMITY LANDMARK RIGHT eval 07/04/22 07/07/22 07/11/22  At groin      30 cm proximal to suprapatella      20 cm proximal to suprapatella 55 55 55 55.4  10 cm proximal to suprapatella 48 47 47.5 46.6  At midpatella / popliteal crease 43.9 44 43 42  30 cm proximal to floor at lateral plantar foot 39.8 37.5 39.6 36.5  20 cm proximal to floor at lateral plantar foot 31.9 31 30.7 28.6  10 cm proximal to floor at lateral plantar foot 26.5 24.7 25 22.6  Circumference of ankle/heel      5 cm proximal to 1st MTP joint 22._0 21.9  Across MTP joint 23.1 23 22.2 22  Around proximal great toe 8.6 8.6 8.4 8.4  (Blank rows = not tested)  LOWER EXTREMITY LANDMARK LEFT eval  At groin   30 cm proximal to suprapatella   20 cm proximal to suprapatella  56.2  10 cm proximal to suprapatella 47.5  At midpatella / popliteal crease 40.5  30 cm proximal to floor at lateral plantar foot 36.9  20 cm proximal to floor at lateral plantar foot 26.8  10 cm proximal to floor at lateral plantar foot 21.9  Circumference of ankle/heel   5 cm proximal to 1st MTP joint 22.1  Across MTP joint 22.2  Around proximal great toe 8.3  (Blank rows = not tested)  TODAY'S TREATMENT:                                                                                                                           DATE:  07/14/22 Orthotic Fit At beginning of session measured pt for a [employer] and she fits well into the size of Large and tall for length. She would like to see if her insurance coverage assessed so will fax her demographics to  one of our DME companies to verify her coverage, per pt request.  Manual Therapy MLD: in supine with HOB elevated as follows: short neck, superficial and deep abdominals, Rt inguinal nodes and then R LE working proximal to distal, intact sequence, moving fluid towards inguinal nodes.  Compression bandaging to RLE in supine: Cocoa butter applied, TG soft from foot to mid thigh, elastomull to digits 1-4, artiflex around foot/ankle, rosidal from just below knee to mid thigh, 1 6cm bandage at foot, 1 8cm bandage at ankle, 1 12 cm from foot to knee, 1 12 cm from lower leg to mid thigh with spiral, 1 12 cm from lower leg to mid thigh   07/11/2022 Removed bandages. Washed leg with soap and water and remeasured circumferences.  MLD: in supine with head on 2 pillows as follows: short neck, 5 diaphragmatic breaths, R inguinal nodes and then R LE working proximal to distal moving fluid towards inguinal nodes while educating pt on basic principles of MLD.  Compression bandaging to RLE in sitting: TG soft from foot to mid thigh, mollelast to digits 1-5, artiflex from foot to knee, rosidal from just below knee to mid thigh, 1 6cm bandage at foot,  1 8cm bandage at ankle, 1 12 cm from foot to knee, 1 12 cm from lower leg to mid thigh  with spiral, 1 12 cm from lower leg to mid thigh with spiral. Pt donned sock and sandal.   07/09/2022 Pt arrived to appt with compression bandages removed because she took a shower this morning. MLD: in supine with head on 2 pillows as follows: short neck, 5 diaphragmatic breaths, R inguinal nodes and then R LE working proximal to distal moving fluid towards inguinal nodes while educating pt on basic principles of MLD.  Compression bandaging to RLE in sitting: TG soft from foot to mid thigh, mollelast to digits 1-5, artiflex from foot to knee, rosidal from just below knee to mid thigh, 1 6cm bandage at foot, 1 8cm bandage at ankle, 1 12 cm from foot to knee, 1 12 cm from lower leg to mid thigh  with spiral, 1 12 cm from lower leg to mid thigh with spiral. Gave pt blue footie for her right LE. 07/07/22:   Pt arrived to appt with compression bandages removed because she took a shower this morning. MLD: in supine with head on 2 pillows as follows: short neck, 5 diaphragmatic breaths, R inguinal nodes and then R LE working proximal to distal moving fluid towards inguinal nodes while educating pt on basic principles of MLD.  Compression bandaging to RLE in sitting: TG soft from foot to mid thigh, mollelast to digits 1-5, artiflex from foot to knee, rosidal from just below knee to mid thigh, 1 6cm bandage at foot, 1 8cm bandage at ankle, 1 12 cm from foot to knee, 1 12 cm from lower leg to mid thigh in herringbone, 1 12 cm from lower leg to mid thigh  07/04/22:   Removed bandages, washed pts leg, remeasured LE then reapplied Compression bandaging to RLE in sitting: TG soft from foot to mid thigh, mollelast to digits 1-5, artiflex from foot to knee, rosidal from just below knee to mid thigh, 1 6cm bandage at foot, 1 8cm bandage at ankle, 1 12 cm from foot to knee, 1 12 cm from lower leg to mid thigh in herringbone, 1 12 cm  from lower leg to mid thigh  07/02/22- Compression bandaging to RLE in sitting: TG soft from  foot to mid thigh, mollelast to digits 1-5, artiflex from foot to knee, rosidal from just below knee to mid thigh, 1 6cm bandage at foot, 1 8cm bandage at ankle, 1 12 cm from foot to knee, 1 12 cm from lower leg to mid thigh in herringbone, 1 12 cm from lower leg to mid thigh  PATIENT EDUCATION:  Education details: causes of edema, how a DVT can lead to prolonged edema in combination with recent surgery, need for bandaging and then eventually compression garments Person educated: Patient Education method: Explanation Education comprehension: verbalized understanding  HOME EXERCISE PROGRAM: Wear compression bandaging 24/7 and remove if pain or numbness that does not go away when you move  ASSESSMENT:  CLINICAL IMPRESSION: Measured pts Rt LE for a [employer] Exostrong thigh high garment. She fits nicely into size larger, length tall. Pt requested her demographics be sent to a DME company to assess insurance coverage so will do this. Then continued with Rt LE CDT, see above for treatment sequences.   OBJECTIVE IMPAIRMENTS: decreased knowledge of condition, decreased knowledge of use of DME, increased edema, and pain.   ACTIVITY LIMITATIONS: standing and standing, walking  PARTICIPATION LIMITATIONS: occupation  PERSONAL FACTORS:  none  are also affecting patient's functional outcome.   REHAB POTENTIAL: Excellent  CLINICAL DECISION MAKING: Stable/uncomplicated  EVALUATION COMPLEXITY: Low  GOALS: Goals reviewed with patient? Yes  SHORT TERM GOALS=LONG TERM GOALS Target date: 08/11/2022    Pt will demonstrate a 4 cm decrease in edema 10 cm proximal to lateral malleoli to improve comfort.  Baseline: 26.5cm  Goal status: INITIAL  2.  Pt will obtain appropriate compression garments for long term management of edema.  Baseline:  Goal status: INITIAL  3.  Pt will be independent in self MLD for  long term management of edema.  Baseline:  Goal status: INITIAL  4.  Pt will report a 75% improvement in discomfort and pain in RLE to allow improved ability to walk and stand.  Baseline:  Goal status: INITIAL  PLAN:  PT FREQUENCY: 3x/week  PT DURATION: 4 weeks  PLANNED INTERVENTIONS: Therapeutic exercises, Therapeutic activity, Patient/Family education, Self Care, Orthotic/Fit training, Manual lymph drainage, Compression bandaging, Taping, Vasopneumatic device, and Manual therapy  PLAN FOR NEXT SESSION: Heard back from compression garment company? Cont CDT to Rt LE    Otelia Limes, PTA 07/14/2022, 5:21 PM

## 2022-07-16 ENCOUNTER — Ambulatory Visit: Payer: Commercial Managed Care - HMO

## 2022-07-16 DIAGNOSIS — R262 Difficulty in walking, not elsewhere classified: Secondary | ICD-10-CM

## 2022-07-16 DIAGNOSIS — R600 Localized edema: Secondary | ICD-10-CM | POA: Diagnosis not present

## 2022-07-16 NOTE — Therapy (Signed)
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,  multifocal IDC, DCIS, ER/PR+,  01-09-2016 s/p bilteral mastectomies w/ left sln bx;  no chemoradiation   Cataracts, bilateral    Depression    Diabetes mellitus without complication ([place])    type 2   GAD (generalized anxiety disorder)    Gallbladder problem    History of ovarian cyst    IBS (irritable bowel syndrome)    Joint pain    PONV (postoperative nausea and vomiting)    does well with scop patch   Retinal detachment    Rheg OS   Right knee meniscal tear    Urgency of urination    Past Surgical History:  Procedure Laterality Date   ABDOMINAL HYSTERECTOMY  05/1996   endometriosis   ACHILLES TENDON REPAIR Right 2010;  revision 2011   BLADDER SUSPENSION  2000   BREAST BIOPSY Left 10/2015   BREAST IMPLANT REMOVAL Bilateral 08/12/2017   Procedure: REMOVAL BILATERAL BREAST IMPLANTS;  Surgeon: Wallace Going, DO;  Location: [place];  Service: Plastics;  Laterality: Bilateral;   BREAST RECONSTRUCTION WITH PLACEMENT OF TISSUE EXPANDER AND FLEX HD (ACELLULAR HYDRATED DERMIS) Bilateral 01/09/2016   BREAST RECONSTRUCTION WITH PLACEMENT OF TISSUE EXPANDER AND FLEX HD (ACELLULAR HYDRATED DERMIS) Bilateral 01/09/2016   Procedure: BREAST RECONSTRUCTION WITH PLACEMENT OF TISSUE EXPANDER AND FLEX HD (ACELLULAR HYDRATED  DERMIS);  Surgeon: Loel Lofty Dillingham, DO;  Location: [place];  Service: Plastics;  Laterality: Bilateral;   BREAST RECONSTRUCTION WITH PLACEMENT OF TISSUE EXPANDER AND FLEX HD (ACELLULAR HYDRATED DERMIS) Bilateral 05/29/2016   Procedure: PLACEMENT OF BILATERAL TISSUE EXPANDER AND FLEX HD (ACELLULAR HYDRATED DERMIS);  Surgeon: Wallace Going, DO;  Location: [place];  Service: Plastics;  Laterality: Bilateral;   BREAST REDUCTION SURGERY Bilateral 11/26/2016   Procedure: BILATERAL BREAST CAPSULE CONTRACTURE RELASE;  Surgeon: Wallace Going, DO;  Location: [place];  Service: Plastics;  Laterality: Bilateral;   [place]; 1989; [place] Right x3   last one 02-01-2019 @ [facility]   EYE SURGERY Left 10/01/2020   Pneumatic retinopexy for repair of rheg RD - Dr. Bernarda Caffey   EYE SURGERY Left 10/04/2020   PPV - Dr. Bernarda Caffey   FAT GRAFTING BILATERAL BREAST  08-09-2018  _0    GAS INSERTION Left 10/04/2020   Procedure: INSERTION OF GAS;  Surgeon: Bernarda Caffey, MD;  Location: [place];  Service: Ophthalmology;  Laterality: Left;   GAS/FLUID EXCHANGE Left 10/04/2020   Procedure: GAS/FLUID EXCHANGE;  Surgeon: Bernarda Caffey, MD;  Location: [place];  Service: Ophthalmology;  Laterality: Left;   INCISION AND DRAINAGE OF WOUND Bilateral 02/11/2016   Procedure: IRRIGATION  AND DEBRIDEMENT OF BILATERAL BREAST POCKET;  Surgeon: Wallace Going, DO;  Location: [place];  Service: Plastics;  Laterality: Bilateral;   KNEE ARTHROSCOPY WITH MEDIAL MENISECTOMY Right 12/20/2019   Procedure: KNEE ARTHROSCOPY WITH MEDIAL MENISECTOMY;  Surgeon: Marchia Bond, MD;  Location: [place];  Service: Orthopedics;  Laterality: Right;   KNEE ARTHROSCOPY WITH MEDIAL MENISECTOMY Right 10/30/2021   Procedure: RIGHT KNEE ARTHROSCOPY WITH PARTIAL MEDIAL MENISECTOMY SYNOVECTOMY;  Surgeon: Leandrew Koyanagi, MD;  Location: [place];  Service: Orthopedics;  Laterality: Right;   LAPAROSCOPIC APPENDECTOMY  04-07-2011   _0    w/ Excision peritoneal lipoma and lysis adhesions   LAPAROSCOPIC CHOLECYSTECTOMY  ~ [place] Right 10/04/2020   Procedure: LASER RETINOPEXY WITH INDIRECT LASER OPTHALMOSCOPE, RIGHT EYE;  Surgeon: Bernarda Caffey, MD;  Location: [place];  Service: Ophthalmology;  Laterality: Right;   LIPOSUCTION WITH LIPOFILLING Bilateral 11/26/2016   Procedure: LIPOFILLING FOR SYMMETRY;  Surgeon: Wallace Going, DO;  Location: [place];  Service: Plastics;  Laterality: Bilateral;   LIPOSUCTION WITH LIPOFILLING Bilateral 01/21/2017   Procedure: BILATERAL BREAST  LIPOFILLING FOR ASYMMETRY;  Surgeon: Wallace Going, DO;  Location: [place];  Service: Plastics;  Laterality: Bilateral;   LIPOSUCTION WITH LIPOFILLING Bilateral 06/28/2020   Procedure: Lipofilling bilateral breasts for asymmetry;  Surgeon: Wallace Going, DO;  Location: [place];  Service: Plastics;  Laterality: Bilateral;  90 min   MASTECTOMY Bilateral 01/09/2016   NIPPLE SPARING MASTECTOMY/SENTINAL LYMPH NODE BIOPSY/RECONSTRUCTION/PLACEMENT OF TISSUE EXPANDER Bilateral 01/09/2016   Procedure: BILATERAL NIPPLE SPARING MASTECTOMY WITH LEFT SENTINAL LYMPH NODE BIOPSY ;  Surgeon: Stark Klein, MD;  Location: [place];  Service: General;  Laterality: Bilateral;   PHOTOCOAGULATION WITH LASER Left 10/04/2020   Procedure: PHOTOCOAGULATION WITH LASER;  Surgeon: Bernarda Caffey, MD;  Location: [place];  Service: Ophthalmology;  Laterality: Left;   REMOVAL OF BILATERAL TISSUE EXPANDERS WITH PLACEMENT OF BILATERAL BREAST IMPLANTS Bilateral 08/20/2016   Procedure: REMOVAL OF BILATERAL TISSUE EXPANDERS WITH PLACEMENT OF BILATERAL SILICONE IMPLANTS;  Surgeon: Wallace Going, DO;  Location: [place];  Service: Plastics;  Laterality: Bilateral;   REMOVAL OF BILATERAL TISSUE EXPANDERS  WITH PLACEMENT OF BILATERAL BREAST IMPLANTS Bilateral 11/05/2017   Procedure: REMOVAL OF BILATERAL TISSUE EXPANDERS WITH PLACEMENT OF BILATERAL BREAST SILICONE IMPLANTS;  Surgeon: Wallace Going, DO;  Location: [place];  Service: Plastics;  Laterality: Bilateral;   REMOVAL OF TISSUE EXPANDER Bilateral 02/11/2016   Procedure: REMOVAL OF BILATERAL TISSUE EXPANDERS AND FLEX HD REMOVAL;  Surgeon: Wallace Going, DO;  Location: [place];  Service: Plastics;  Laterality: Bilateral;   RETINAL DETACHMENT SURGERY Left 10/01/2020   Pneumatic retinopexy for repair of rheg RD - Dr. Bernarda Caffey   RETINAL DETACHMENT SURGERY Left 10/04/2020   PPV - Dr. Bernarda Caffey   SCLERAL BUCKLE Left 10/04/2020   Procedure: SCLERAL BUCKLE LEFT EYE;  Surgeon: Bernarda Caffey, MD;  Location: [place];  Service: Ophthalmology;  Laterality: Left;   TISSUE EXPANDER PLACEMENT Bilateral 08/12/2017   Procedure: PLACEMENT OF BILATERAL TISSUE EXPANDER;  Surgeon: Wallace Going, DO;  Location: [place];  Service: Plastics;  Laterality: Bilateral;   TOTAL KNEE ARTHROPLASTY Right 03/31/2022   Procedure: RIGHT TOTAL KNEE REPLACEMENT;  Surgeon: Leandrew Koyanagi, MD;  Location: [place];  Service: Orthopedics;  Laterality: Right;   TUBAL LIGATION Bilateral 1991   VITRECTOMY 25  GAUGE WITH SCLERAL BUCKLE Left 10/04/2020   Procedure: 3 GAUGE PARS PLANA VITRECTOMY LEFT EYE ;  Surgeon: Bernarda Caffey, MD;  Location: [place];  Service: Ophthalmology;  Laterality: Left;   Patient Active Problem List   Diagnosis Date Noted   DVT (deep venous thrombosis) ([place]) 05/11/2022   Acute pulmonary embolism ([place]) 05/09/2022   Chest pain 05/09/2022   Elevated troponin 05/09/2022   Status post total right knee replacement 03/31/2022   Primary osteoarthritis of right knee 03/20/2022   Eating disorder 03/20/2022   Acute medial meniscus tear, right, initial encounter 10/01/2021   At risk for dehydration  01/28/2021   At risk for malnutrition 01/01/2021   Obesity, Class I, BMI 30-34.9 12/31/2020   Vitamin B12 deficiency 10/08/2020   At risk for depression 10/08/2020   Mood disorder ([place]), with emotional eating 09/24/2020   At risk for impaired metabolic function 33/83/2919   At risk for diabetes mellitus 07/30/2020   Vitamin D deficiency 07/02/2020   Depression 07/02/2020   At risk for side effect of medication 07/02/2020   Stress due to illness of family member-  daughter with drug addiction 05/15/2020   Depression, recurrent ([place]) 05/15/2020   Anemia 05/15/2020   Constipation 05/15/2020   Prediabetes 05/15/2020   Breast asymmetry following reconstructive surgery 04/24/2020   Acquired absence of breast 04/24/2020   S/P breast reconstruction, bilateral 04/24/2020   Acute medial meniscus tear of left knee 12/20/2019   Breast cancer, left ([place]) 01/09/2016   Genetic testing 16/60/6004   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   Family history of breast cancer in female 11/08/2015   Family history of colon cancer 11/08/2015   Malignant neoplasm of central portion of left breast in female, estrogen receptor positive ([place]) 10/31/2015   Endometriosis 03/11/2011    PCP: Maurice Small, MD  REFERRING PROVIDER: Nicholas Lose, MD   REFERRING DIAG: M79.89 (ICD-10-CM) - Right leg swelling   THERAPY DIAG:  Localized edema  Difficulty in walking, not elsewhere classified  ONSET DATE: 03/31/22  Rationale for Evaluation and Treatment Rehabilitation  SUBJECTIVE:                                                                                                                                                                                           SUBJECTIVE STATEMENT: I left the bandages on until this morning when I got in the shower. They felt fine, no problems. I can tell my calf isn't as tight as it used to be.  PERTINENT HISTORY:  Right lower extremity swelling/ lymphedema with known  right lower extremity DVT 05/12/22 and hx of right total knee 03/31/22,  L breast cancer in 2017 with prophylactic bilateral mastectomy PAIN:  Are you having pain? No, but knee pain with walking up to 8/10, less so when bandaged, down to a 4/10  PRECAUTIONS: Other: R TKA 03/31/22  WEIGHT BEARING RESTRICTIONS: No  FALLS:  Has patient fallen in last 6 months? 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  LEISURE: pt does not exercise  HAND DOMINANCE: right   PRIOR LEVEL OF FUNCTION: Independent  PATIENT GOALS: to walk without a cane, to walk where it does not hurt as much   OBJECTIVE:  COGNITION: Overall cognitive status: Within functional limits for tasks assessed   PALPATION: Fibrosis throughout R LE, pitting edema  OBSERVATIONS / OTHER ASSESSMENTS: RLE from knee down approx twice the size of left    LOWER EXTREMITY LANDMARK RIGHT eval 07/04/22 07/07/22 07/11/22  At groin      30 cm proximal to suprapatella      20 cm proximal to suprapatella 55 55 55 55.4  10 cm proximal to suprapatella 48 47 47.5 46.6  At midpatella / popliteal crease 43.9 44 43 42  30 cm proximal to floor at lateral plantar foot 39.8 37.5 39.6 36.5  20 cm proximal to floor at lateral plantar foot 31.9 31 30.7 28.6  10 cm proximal to floor at lateral plantar foot 26.5 24.7 25 22.6  Circumference of ankle/heel      5 cm proximal to 1st MTP joint 22._0 21.9  Across MTP joint 23.1 23 22.2 22  Around proximal great toe 8.6 8.6 8.4 8.4  (Blank rows = not tested)  LOWER EXTREMITY LANDMARK LEFT eval  At groin   30 cm proximal to suprapatella   20 cm proximal to suprapatella 56.2  10 cm proximal to suprapatella 47.5  At midpatella /  popliteal crease 40.5  30 cm proximal to floor at lateral plantar foot 36.9  20 cm proximal to floor at lateral plantar foot 26.8  10 cm proximal to floor at lateral plantar foot 21.9  Circumference of ankle/heel   5 cm proximal to 1st MTP joint 22.1  Across MTP joint 22.2  Around proximal great toe 8.3  (Blank rows = not tested)  TODAY'S TREATMENT:                                                                                                                           DATE:  07/16/22 Manual Therapy MLD: in supine with HOB elevated as follows: short neck, superficial and deep abdominals, Rt inguinal nodes and then R LE working proximal to distal, intact sequence, moving fluid towards inguinal nodes.  Compression bandaging to RLE in supine: Cocoa butter applied, TG soft from foot to mid thigh,  elastomull to digits 1-4, artiflex around foot/ankle, rosidal from just below knee to mid thigh, 1 6cm bandage at foot, 1 8cm bandage at ankle, 1 12 cm from foot to knee, 1 12 cm from lower leg to mid thigh with spiral, 1 12 cm from lower leg to mid thigh  07/14/22 Orthotic Fit At beginning of session measured pt for a [employer] and she fits well into the size of Large and tall for length. She would like to see if her insurance coverage assessed so will fax her demographics to one of our DME companies to verify her coverage, per pt request.  Manual Therapy MLD: in supine with HOB elevated as follows: short neck, superficial and deep abdominals, Rt inguinal nodes and then R LE working proximal to distal, intact sequence, moving fluid towards inguinal nodes.  Compression bandaging to RLE in supine: Cocoa butter applied, TG soft from foot to mid thigh, elastomull to digits 1-4, artiflex around foot/ankle, rosidal from just below knee to mid thigh, 1 6cm bandage at foot, 1 8cm bandage at ankle, 1 12 cm from foot to knee, 1 12 cm from lower leg to mid thigh with spiral, 1 12 cm from lower leg to mid  thigh   07/11/2022 Removed bandages. Washed leg with soap and water and remeasured circumferences.  MLD: in supine with head on 2 pillows as follows: short neck, 5 diaphragmatic breaths, R inguinal nodes and then R LE working proximal to distal moving fluid towards inguinal nodes while educating pt on basic principles of MLD.  Compression bandaging to RLE in sitting: TG soft from foot to mid thigh, mollelast to digits 1-5, artiflex from foot to knee, rosidal from just below knee to mid thigh, 1 6cm bandage at foot, 1 8cm bandage at ankle, 1 12 cm from foot to knee, 1 12 cm from lower leg to mid thigh  with spiral, 1 12 cm from lower leg to mid thigh with spiral. Pt donned sock and sandal.   07/09/2022 Pt arrived to appt with compression bandages removed because she took a shower this morning. MLD: in supine with head on 2 pillows as follows: short neck, 5 diaphragmatic breaths, R inguinal nodes and then R LE working proximal to distal moving fluid towards inguinal nodes while educating pt on basic principles of MLD.  Compression bandaging to RLE in sitting: TG soft from foot to mid thigh, mollelast to digits 1-5, artiflex from foot to knee, rosidal from just below knee to mid thigh, 1 6cm bandage at foot, 1 8cm bandage at ankle, 1 12 cm from foot to knee, 1 12 cm from lower leg to mid thigh  with spiral, 1 12 cm from lower leg to mid thigh with spiral. Gave pt blue footie for her right LE. 07/07/22:   Pt arrived to appt with compression bandages removed because she took a shower this morning. MLD: in supine with head on 2 pillows as follows: short neck, 5 diaphragmatic breaths, R inguinal nodes and then R LE working proximal to distal moving fluid towards inguinal nodes while educating pt on basic principles of MLD.  Compression bandaging to RLE in sitting: TG soft from foot to mid thigh, mollelast to digits 1-5, artiflex from foot to knee, rosidal from just below knee to mid thigh, 1 6cm bandage at  foot, 1 8cm bandage at ankle, 1 12 cm from foot to knee, 1 12 cm from lower leg to mid thigh in herringbone, 1 12 cm from  lower leg to mid thigh   PATIENT EDUCATION:  Education details: causes of edema, how a DVT can lead to prolonged edema in combination with recent surgery, need for bandaging and then eventually compression garments Person educated: Patient Education method: Explanation Education comprehension: verbalized understanding  HOME EXERCISE PROGRAM: Wear compression bandaging 24/7 and remove if pain or numbness that does not go away when you move  ASSESSMENT:  CLINICAL IMPRESSION: Pts demographics have been faxed to [place] and we are waiting to hear back about her coverage. Today continued with CDT to her Rt LE. God reduction noted since last visit as she is palpably less tight and pt reports her calf feeling looser today as well when she doffed her bandages this morning.   OBJECTIVE IMPAIRMENTS: decreased knowledge of condition, decreased knowledge of use of DME, increased edema, and pain.   ACTIVITY LIMITATIONS: standing and standing, walking  PARTICIPATION LIMITATIONS: occupation  PERSONAL FACTORS:  none  are also affecting patient's functional outcome.   REHAB POTENTIAL: Excellent  CLINICAL DECISION MAKING: Stable/uncomplicated  EVALUATION COMPLEXITY: Low  GOALS: Goals reviewed with patient? Yes  SHORT TERM GOALS=LONG TERM GOALS Target date: 08/13/2022    Pt will demonstrate a 4 cm decrease in edema 10 cm proximal to lateral malleoli to improve comfort.  Baseline: 26.5cm  Goal status: INITIAL  2.  Pt will obtain appropriate compression garments for long term management of edema.  Baseline:  Goal status: INITIAL  3.  Pt will be independent in self MLD for long term management of edema.  Baseline:  Goal status: INITIAL  4.  Pt will report a 75% improvement in discomfort and pain in RLE to allow improved ability to walk and stand.  Baseline:  Goal  status: INITIAL  PLAN:  PT FREQUENCY: 3x/week  PT DURATION: 4 weeks  PLANNED INTERVENTIONS: Therapeutic exercises, Therapeutic activity, Patient/Family education, Self Care, Orthotic/Fit training, Manual lymph drainage, Compression bandaging, Taping, Vasopneumatic device, and Manual therapy  PLAN FOR NEXT SESSION: Heard back from compression garment company? Cont CDT to Rt LE    Otelia Limes, PTA 07/16/2022, 10:00 AM

## 2022-07-18 ENCOUNTER — Ambulatory Visit: Payer: Commercial Managed Care - HMO

## 2022-07-18 DIAGNOSIS — R262 Difficulty in walking, not elsewhere classified: Secondary | ICD-10-CM

## 2022-07-18 DIAGNOSIS — R600 Localized edema: Secondary | ICD-10-CM | POA: Diagnosis not present

## 2022-07-18 NOTE — Therapy (Signed)
OUTPATIENT PHYSICAL THERAPY ONCOLOGY TREATMENT  Patient Name: Christina [name] MRN: [id number] DOB:[birth year], 56 y.o., female Today's Date: 07/18/2022   PT End of Session - 07/18/22 0815     Visit Number 8    Number of Visits 13    Date for PT Re-Evaluation 07/30/22    PT Start Time 0813   pt arrived late   PT Stop Time 0859    PT Time Calculation (min) 46 min    Activity Tolerance Patient tolerated treatment well    Behavior During Therapy [facility] for tasks assessed/performed               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,  multifocal IDC, DCIS, ER/PR+,  01-09-2016 s/p bilteral mastectomies w/ left sln bx;  no chemoradiation   Cataracts, bilateral    Depression    Diabetes mellitus without complication ([place])    type 2   GAD (generalized anxiety disorder)    Gallbladder problem    History of ovarian cyst    IBS (irritable bowel syndrome)    Joint pain    PONV (postoperative nausea and vomiting)    does well with scop patch   Retinal detachment    Rheg OS   Right knee meniscal tear    Urgency of urination    Past Surgical History:  Procedure Laterality Date   ABDOMINAL HYSTERECTOMY  05/1996   endometriosis   ACHILLES TENDON REPAIR Right 2010;  revision 2011   BLADDER SUSPENSION  2000   BREAST BIOPSY Left 10/2015   BREAST IMPLANT REMOVAL Bilateral 08/12/2017   Procedure: REMOVAL BILATERAL BREAST IMPLANTS;  Surgeon: Wallace Going, DO;  Location: [place];  Service: Plastics;  Laterality: Bilateral;   BREAST RECONSTRUCTION WITH PLACEMENT OF TISSUE EXPANDER AND FLEX HD (ACELLULAR HYDRATED DERMIS) Bilateral 01/09/2016   BREAST RECONSTRUCTION WITH PLACEMENT OF TISSUE EXPANDER AND FLEX HD (ACELLULAR HYDRATED DERMIS) Bilateral 01/09/2016   Procedure: BREAST RECONSTRUCTION WITH PLACEMENT OF TISSUE EXPANDER AND FLEX HD  (ACELLULAR HYDRATED DERMIS);  Surgeon: Loel Lofty Dillingham, DO;  Location: [place];  Service: Plastics;  Laterality: Bilateral;   BREAST RECONSTRUCTION WITH PLACEMENT OF TISSUE EXPANDER AND FLEX HD (ACELLULAR HYDRATED DERMIS) Bilateral 05/29/2016   Procedure: PLACEMENT OF BILATERAL TISSUE EXPANDER AND FLEX HD (ACELLULAR HYDRATED DERMIS);  Surgeon: Wallace Going, DO;  Location: [place];  Service: Plastics;  Laterality: Bilateral;   BREAST REDUCTION SURGERY Bilateral 11/26/2016   Procedure: BILATERAL BREAST CAPSULE CONTRACTURE RELASE;  Surgeon: Wallace Going, DO;  Location: [place];  Service: Plastics;  Laterality: Bilateral;   [place]; 1989; [place] Right x3   last one 02-01-2019 @ [facility]   EYE SURGERY Left 10/01/2020   Pneumatic retinopexy for repair of rheg RD - Dr. Bernarda Caffey   EYE SURGERY Left 10/04/2020   PPV - Dr. Bernarda Caffey   FAT GRAFTING BILATERAL BREAST  08-09-2018  _0    GAS INSERTION Left 10/04/2020   Procedure: INSERTION OF GAS;  Surgeon: Bernarda Caffey, MD;  Location: [place];  Service: Ophthalmology;  Laterality: Left;   GAS/FLUID EXCHANGE Left 10/04/2020   Procedure: GAS/FLUID EXCHANGE;  Surgeon: Bernarda Caffey, MD;  Location: [place];  Service: Ophthalmology;  Laterality: Left;   INCISION AND DRAINAGE OF WOUND Bilateral 02/11/2016  Procedure: IRRIGATION AND DEBRIDEMENT OF BILATERAL BREAST POCKET;  Surgeon: Wallace Going, DO;  Location: [place];  Service: Plastics;  Laterality: Bilateral;   KNEE ARTHROSCOPY WITH MEDIAL MENISECTOMY Right 12/20/2019   Procedure: KNEE ARTHROSCOPY WITH MEDIAL MENISECTOMY;  Surgeon: Marchia Bond, MD;  Location: [place];  Service: Orthopedics;  Laterality: Right;   KNEE ARTHROSCOPY WITH MEDIAL MENISECTOMY Right 10/30/2021   Procedure: RIGHT KNEE ARTHROSCOPY WITH PARTIAL MEDIAL MENISECTOMY SYNOVECTOMY;  Surgeon: Leandrew Koyanagi, MD;  Location:  [place];  Service: Orthopedics;  Laterality: Right;   LAPAROSCOPIC APPENDECTOMY  04-07-2011   _0    w/ Excision peritoneal lipoma and lysis adhesions   LAPAROSCOPIC CHOLECYSTECTOMY  ~ [place] Right 10/04/2020   Procedure: LASER RETINOPEXY WITH INDIRECT LASER OPTHALMOSCOPE, RIGHT EYE;  Surgeon: Bernarda Caffey, MD;  Location: [place];  Service: Ophthalmology;  Laterality: Right;   LIPOSUCTION WITH LIPOFILLING Bilateral 11/26/2016   Procedure: LIPOFILLING FOR SYMMETRY;  Surgeon: Wallace Going, DO;  Location: [place];  Service: Plastics;  Laterality: Bilateral;   LIPOSUCTION WITH LIPOFILLING Bilateral 01/21/2017   Procedure: BILATERAL BREAST  LIPOFILLING FOR ASYMMETRY;  Surgeon: Wallace Going, DO;  Location: [place];  Service: Plastics;  Laterality: Bilateral;   LIPOSUCTION WITH LIPOFILLING Bilateral 06/28/2020   Procedure: Lipofilling bilateral breasts for asymmetry;  Surgeon: Wallace Going, DO;  Location: [place];  Service: Plastics;  Laterality: Bilateral;  90 min   MASTECTOMY Bilateral 01/09/2016   NIPPLE SPARING MASTECTOMY/SENTINAL LYMPH NODE BIOPSY/RECONSTRUCTION/PLACEMENT OF TISSUE EXPANDER Bilateral 01/09/2016   Procedure: BILATERAL NIPPLE SPARING MASTECTOMY WITH LEFT SENTINAL LYMPH NODE BIOPSY ;  Surgeon: Stark Klein, MD;  Location: [place];  Service: General;  Laterality: Bilateral;   PHOTOCOAGULATION WITH LASER Left 10/04/2020   Procedure: PHOTOCOAGULATION WITH LASER;  Surgeon: Bernarda Caffey, MD;  Location: [place];  Service: Ophthalmology;  Laterality: Left;   REMOVAL OF BILATERAL TISSUE EXPANDERS WITH PLACEMENT OF BILATERAL BREAST IMPLANTS Bilateral 08/20/2016   Procedure: REMOVAL OF BILATERAL TISSUE EXPANDERS WITH PLACEMENT OF BILATERAL SILICONE IMPLANTS;  Surgeon: Wallace Going, DO;  Location: [place];  Service: Plastics;  Laterality: Bilateral;   REMOVAL OF  BILATERAL TISSUE EXPANDERS WITH PLACEMENT OF BILATERAL BREAST IMPLANTS Bilateral 11/05/2017   Procedure: REMOVAL OF BILATERAL TISSUE EXPANDERS WITH PLACEMENT OF BILATERAL BREAST SILICONE IMPLANTS;  Surgeon: Wallace Going, DO;  Location: [place];  Service: Plastics;  Laterality: Bilateral;   REMOVAL OF TISSUE EXPANDER Bilateral 02/11/2016   Procedure: REMOVAL OF BILATERAL TISSUE EXPANDERS AND FLEX HD REMOVAL;  Surgeon: Wallace Going, DO;  Location: [place];  Service: Plastics;  Laterality: Bilateral;   RETINAL DETACHMENT SURGERY Left 10/01/2020   Pneumatic retinopexy for repair of rheg RD - Dr. Bernarda Caffey   RETINAL DETACHMENT SURGERY Left 10/04/2020   PPV - Dr. Bernarda Caffey   SCLERAL BUCKLE Left 10/04/2020   Procedure: SCLERAL BUCKLE LEFT EYE;  Surgeon: Bernarda Caffey, MD;  Location: [place];  Service: Ophthalmology;  Laterality: Left;   TISSUE EXPANDER PLACEMENT Bilateral 08/12/2017   Procedure: PLACEMENT OF BILATERAL TISSUE EXPANDER;  Surgeon: Wallace Going, DO;  Location: [place];  Service: Plastics;  Laterality: Bilateral;   TOTAL KNEE ARTHROPLASTY Right 03/31/2022   Procedure: RIGHT TOTAL KNEE REPLACEMENT;  Surgeon: Leandrew Koyanagi, MD;  Location: [place];  Service: Orthopedics;  Laterality: Right;   TUBAL LIGATION Bilateral 1991  VITRECTOMY 25 GAUGE WITH SCLERAL BUCKLE Left 10/04/2020   Procedure: 25 GAUGE PARS PLANA VITRECTOMY LEFT EYE ;  Surgeon: Bernarda Caffey, MD;  Location: [place];  Service: Ophthalmology;  Laterality: Left;   Patient Active Problem List   Diagnosis Date Noted   DVT (deep venous thrombosis) ([place]) 05/11/2022   Acute pulmonary embolism ([place]) 05/09/2022   Chest pain 05/09/2022   Elevated troponin 05/09/2022   Status post total right knee replacement 03/31/2022   Primary osteoarthritis of right knee 03/20/2022   Eating disorder 03/20/2022   Acute medial meniscus tear, right, initial encounter 10/01/2021    At risk for dehydration 01/28/2021   At risk for malnutrition 01/01/2021   Obesity, Class I, BMI 30-34.9 12/31/2020   Vitamin B12 deficiency 10/08/2020   At risk for depression 10/08/2020   Mood disorder ([place]), with emotional eating 09/24/2020   At risk for impaired metabolic function 20/94/7096   At risk for diabetes mellitus 07/30/2020   Vitamin D deficiency 07/02/2020   Depression 07/02/2020   At risk for side effect of medication 07/02/2020   Stress due to illness of family member-  daughter with drug addiction 05/15/2020   Depression, recurrent ([place]) 05/15/2020   Anemia 05/15/2020   Constipation 05/15/2020   Prediabetes 05/15/2020   Breast asymmetry following reconstructive surgery 04/24/2020   Acquired absence of breast 04/24/2020   S/P breast reconstruction, bilateral 04/24/2020   Acute medial meniscus tear of left knee 12/20/2019   Breast cancer, left ([place]) 01/09/2016   Genetic testing 28/36/6294   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   Family history of breast cancer in female 11/08/2015   Family history of colon cancer 11/08/2015   Malignant neoplasm of central portion of left breast in female, estrogen receptor positive ([place]) 10/31/2015   Endometriosis 03/11/2011    PCP: Maurice Small, MD  REFERRING PROVIDER: Nicholas Lose, MD   REFERRING DIAG: M79.89 (ICD-10-CM) - Right leg swelling   THERAPY DIAG:  Localized edema  Difficulty in walking, not elsewhere classified  ONSET DATE: 03/31/22  Rationale for Evaluation and Treatment Rehabilitation  SUBJECTIVE:                                                                                                                                                                                           SUBJECTIVE STATEMENT: I took the bandages off this morning again to shower. My Rt calf is much smaller this week.   PERTINENT HISTORY:  Right lower extremity swelling/ lymphedema with known right lower extremity DVT 05/12/22  and hx of right total knee 03/31/22, L breast cancer in 2017 with prophylactic bilateral mastectomy PAIN:  Are you having pain? No, but knee pain with walking up to 8/10, less so when bandaged, down to a 4/10  PRECAUTIONS: Other: R TKA 03/31/22  WEIGHT BEARING RESTRICTIONS: No  FALLS:  Has patient fallen in last 6 months? 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  LEISURE: pt does not exercise  HAND DOMINANCE: right   PRIOR LEVEL OF FUNCTION: Independent  PATIENT GOALS: to walk without a cane, to walk where it does not hurt as much   OBJECTIVE:  COGNITION: Overall cognitive status: Within functional limits for tasks assessed   PALPATION: Fibrosis throughout R LE, pitting edema  OBSERVATIONS / OTHER ASSESSMENTS: RLE from knee down approx twice the size of left    LOWER EXTREMITY LANDMARK RIGHT eval 07/04/22 07/07/22 07/11/22 07/18/22  At groin       30 cm proximal to suprapatella       20 cm proximal to suprapatella 55 55 55 55.4 57.2  10 cm proximal to suprapatella 48 47 47.5 46.6 48.2  At midpatella / popliteal crease 43.9 44 43 42 42.6  30 cm proximal to floor at lateral plantar foot 39.8 37.5 39.6 36.5 34.9  20 cm proximal to floor at lateral plantar foot 31.9 31 30.7 28.6 26.9  10 cm proximal to floor at lateral plantar foot 26.5 24.7 25 22.6 23.1  Circumference of ankle/heel       5 cm proximal to 1st MTP joint 22._0 21.9 22.5  Across MTP joint 23.1 23 22.2 22 22.3  Around proximal great toe 8.6 8.6 8.4 8.4 8.3  (Blank rows = not tested)  LOWER EXTREMITY LANDMARK LEFT eval  At groin   30 cm proximal to suprapatella   20 cm proximal to suprapatella 56.2  10 cm proximal to suprapatella  47.5  At midpatella / popliteal crease 40.5  30 cm proximal to floor at lateral plantar foot 36.9  20 cm proximal to floor at lateral plantar foot 26.8  10 cm proximal to floor at lateral plantar foot 21.9  Circumference of ankle/heel   5 cm proximal to 1st MTP joint 22.1  Across MTP joint 22.2  Around proximal great toe 8.3  (Blank rows = not tested)  TODAY'S TREATMENT:                                                                                                                           DATE:  07/18/22 Manual Therapy MLD: in supine with HOB elevated as follows: short neck, superficial and deep abdominals, Rt inguinal nodes and then R LE working proximal to distal, intact sequence, moving fluid towards inguinal nodes.  Compression bandaging to RLE in supine: Cocoa butter applied, TG soft from foot to  mid thigh, elastomull to digits 1-4, artiflex around foot/ankle, rosidal from just below knee to mid thigh, 1 6cm bandage at foot, 1 8cm bandage at ankle, 1 12 cm from foot to knee, 1 12 cm from lower leg to mid thigh with spiral, 1 12 cm from lower leg to mid thigh  07/16/22 Manual Therapy MLD: in supine with HOB elevated as follows: short neck, superficial and deep abdominals, Rt inguinal nodes and then R LE working proximal to distal, intact sequence, moving fluid towards inguinal nodes.  Compression bandaging to RLE in supine: Cocoa butter applied, TG soft from foot to mid thigh, elastomull to digits 1-4, artiflex around foot/ankle, rosidal from just below knee to mid thigh, 1 6cm bandage at foot, 1 8cm bandage at ankle, 1 12 cm from foot to knee, 1 12 cm from lower leg to mid thigh with spiral, 1 12 cm from lower leg to mid thigh  07/14/22 Orthotic Fit At beginning of session measured pt for a [employer] and she fits well into the size of Large and tall for length. She would like to see if her insurance coverage assessed so will fax her demographics to one of our DME companies to  verify her coverage, per pt request.  Manual Therapy MLD: in supine with HOB elevated as follows: short neck, superficial and deep abdominals, Rt inguinal nodes and then R LE working proximal to distal, intact sequence, moving fluid towards inguinal nodes.  Compression bandaging to RLE in supine: Cocoa butter applied, TG soft from foot to mid thigh, elastomull to digits 1-4, artiflex around foot/ankle, rosidal from just below knee to mid thigh, 1 6cm bandage at foot, 1 8cm bandage at ankle, 1 12 cm from foot to knee, 1 12 cm from lower leg to mid thigh with spiral, 1 12 cm from lower leg to mid thigh   07/11/2022 Removed bandages. Washed leg with soap and water and remeasured circumferences.  MLD: in supine with head on 2 pillows as follows: short neck, 5 diaphragmatic breaths, R inguinal nodes and then R LE working proximal to distal moving fluid towards inguinal nodes while educating pt on basic principles of MLD.  Compression bandaging to RLE in sitting: TG soft from foot to mid thigh, mollelast to digits 1-5, artiflex from foot to knee, rosidal from just below knee to mid thigh, 1 6cm bandage at foot, 1 8cm bandage at ankle, 1 12 cm from foot to knee, 1 12 cm from lower leg to mid thigh  with spiral, 1 12 cm from lower leg to mid thigh with spiral. Pt donned sock and sandal.   07/09/2022 Pt arrived to appt with compression bandages removed because she took a shower this morning. MLD: in supine with head on 2 pillows as follows: short neck, 5 diaphragmatic breaths, R inguinal nodes and then R LE working proximal to distal moving fluid towards inguinal nodes while educating pt on basic principles of MLD.  Compression bandaging to RLE in sitting: TG soft from foot to mid thigh, mollelast to digits 1-5, artiflex from foot to knee, rosidal from just below knee to mid thigh, 1 6cm bandage at foot, 1 8cm bandage at ankle, 1 12 cm from foot to knee, 1 12 cm from lower leg to mid thigh  with spiral, 1 12 cm  from lower leg to mid thigh with spiral. Gave pt blue footie for her right LE.    PATIENT EDUCATION:  Education details: causes of edema, how a  DVT can lead to prolonged edema in combination with recent surgery, need for bandaging and then eventually compression garments Person educated: Patient Education method: Explanation Education comprehension: verbalized understanding  HOME EXERCISE PROGRAM: Wear compression bandaging 24/7 and remove if pain or numbness that does not go away when you move  ASSESSMENT:  CLINICAL IMPRESSION: Circumference measurements taken today. Good reductions noted at Rt lower leg with some increase at Rt thigh which is to be expected with reductions below as fluid is moving up leg towards trunk. We have not heard back yet from [place] about pts coverage. Continued with CDT of Rt LE.   OBJECTIVE IMPAIRMENTS: decreased knowledge of condition, decreased knowledge of use of DME, increased edema, and pain.   ACTIVITY LIMITATIONS: standing and standing, walking  PARTICIPATION LIMITATIONS: occupation  PERSONAL FACTORS:  none  are also affecting patient's functional outcome.   REHAB POTENTIAL: Excellent  CLINICAL DECISION MAKING: Stable/uncomplicated  EVALUATION COMPLEXITY: Low  GOALS: Goals reviewed with patient? Yes  SHORT TERM GOALS=LONG TERM GOALS Target date: 08/15/2022    Pt will demonstrate a 4 cm decrease in edema 10 cm proximal to lateral malleoli to improve comfort.  Baseline: 26.5cm  Goal status: INITIAL  2.  Pt will obtain appropriate compression garments for long term management of edema.  Baseline:  Goal status: INITIAL  3.  Pt will be independent in self MLD for long term management of edema.  Baseline:  Goal status: INITIAL  4.  Pt will report a 75% improvement in discomfort and pain in RLE to allow improved ability to walk and stand.  Baseline:  Goal status: INITIAL  PLAN:  PT FREQUENCY: 3x/week  PT DURATION: 4  weeks  PLANNED INTERVENTIONS: Therapeutic exercises, Therapeutic activity, Patient/Family education, Self Care, Orthotic/Fit training, Manual lymph drainage, Compression bandaging, Taping, Vasopneumatic device, and Manual therapy  PLAN FOR NEXT SESSION: Heard back from compression garment company? Cont CDT to Rt LE    Otelia Limes, PTA 07/18/2022, 9:05 AM

## 2022-07-21 ENCOUNTER — Ambulatory Visit
Admission: EM | Admit: 2022-07-21 | Discharge: 2022-07-21 | Disposition: A | Discharge status: Home / Self Care | Payer: Commercial Managed Care - HMO | Attending: Urgent Care | Admitting: Urgent Care

## 2022-07-21 ENCOUNTER — Ambulatory Visit: Payer: Commercial Managed Care - HMO | Admitting: Physical Therapy

## 2022-07-21 DIAGNOSIS — R197 Diarrhea, unspecified: Secondary | ICD-10-CM

## 2022-07-21 DIAGNOSIS — R52 Pain, unspecified: Secondary | ICD-10-CM

## 2022-07-21 LAB — POCT INFLUENZA A/B
Influenza A, POC: NEGATIVE
Influenza B, POC: NEGATIVE

## 2022-07-21 MED ORDER — ONDANSETRON 4 MG PO TBDP
4.0000 mg | ORAL_TABLET | Freq: Once | ORAL | Status: AC
  Administered 2022-07-21: 4 mg via ORAL

## 2022-07-21 MED ORDER — CHOLESTYRAMINE LIGHT 4 G PO POWD: 4.0000 g | Freq: Two times a day (BID) | ORAL | 0 refills | Status: DC | PRN

## 2022-07-21 MED ORDER — ONDANSETRON 4 MG PO TBDP: 4.0000 mg | ORAL_TABLET | Freq: Three times a day (TID) | ORAL | 0 refills | Status: DC | PRN

## 2022-07-21 NOTE — ED Triage Notes (Signed)
Pt c/o body aches, headache, nausea, decreased appetite onset this morning

## 2022-07-21 NOTE — Discharge Instructions (Addendum)
Your flu test is negative. It sounds like you have viral gastroenteritis. Please read the attached handout. Use the cholestyramine to help bulk in your stool on an as-needed basis.  Use the antinausea medication also on an as-needed basis. You were given your first dose at 2:45pm, so you can take your next one around 10:45pm if needed. Over the counter Oscillococcinum may help improve your body aches.  Out of work for up to three days, you may return sooner if symptoms improve.

## 2022-07-21 NOTE — ED Provider Notes (Signed)
[name]    CSN: [id number] Arrival date & time: 07/21/22  1250      History   Chief Complaint Chief Complaint  Patient presents with   Generalized Body Aches    HPI Christina [name] is a 56 y.o. female.   Pleasant 56 year old female presents today with an acute onset of GI symptoms that started primarily this morning.  States that last evening before going to bed she felt like something "was not right" in her stomach.  States she woke up this morning and since awakening has had 6 bouts of watery stool.  Reports nausea, but no vomiting.  Denies abdominal pain.  States she also has generalized body aches and joint pains.  Feels chilled but denies a fever.  Denies any possible food sources.  States no one else in her family is sick. No recent travel.      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,  multifocal IDC, DCIS, ER/PR+,  01-09-2016 s/p bilteral mastectomies w/ left sln bx;  no chemoradiation   Cataracts, bilateral    Depression    Diabetes mellitus without complication ([place])    type 2   GAD (generalized anxiety disorder)    Gallbladder problem    History of ovarian cyst    IBS (irritable bowel syndrome)    Joint pain    PONV (postoperative nausea and vomiting)    does well with scop patch   Retinal detachment    Rheg OS   Right knee meniscal tear    Urgency of urination     Patient Active Problem List   Diagnosis Date Noted   DVT (deep venous thrombosis) ([place]) 05/11/2022   Acute pulmonary embolism ([place]) 05/09/2022   Chest pain 05/09/2022   Elevated troponin 05/09/2022   Status post total right knee replacement 03/31/2022   Primary osteoarthritis of right knee 03/20/2022   Eating disorder 03/20/2022   Acute medial meniscus tear, right, initial encounter 10/01/2021   At risk for dehydration 01/28/2021   At risk for  malnutrition 01/01/2021   Obesity, Class I, BMI 30-34.9 12/31/2020   Vitamin B12 deficiency 10/08/2020   At risk for depression 10/08/2020   Mood disorder ([place]), with emotional eating 09/24/2020   At risk for impaired metabolic function 29/51/8841   At risk for diabetes mellitus 07/30/2020   Vitamin D deficiency 07/02/2020   Depression 07/02/2020   At risk for side effect of medication 07/02/2020   Stress due to illness of family member-  daughter with drug addiction 05/15/2020   Depression, recurrent ([place]) 05/15/2020   Anemia 05/15/2020   Constipation 05/15/2020   Prediabetes 05/15/2020   Breast asymmetry following reconstructive surgery 04/24/2020   Acquired absence of breast 04/24/2020   S/P breast reconstruction, bilateral 04/24/2020   Acute medial meniscus tear of left knee 12/20/2019   Breast cancer, left ([place]) 01/09/2016   Genetic testing 66/02/3015   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   Family history of breast cancer in female 11/08/2015   Family history of colon cancer 11/08/2015   Malignant neoplasm of central portion of left breast in female, estrogen receptor positive ([place]) 10/31/2015   Endometriosis 03/11/2011    Past Surgical History:  Procedure Laterality Date   ABDOMINAL HYSTERECTOMY  05/1996   endometriosis   ACHILLES TENDON REPAIR Right 2010;  revision 2011   BLADDER SUSPENSION  2000   BREAST BIOPSY Left 10/2015   BREAST IMPLANT REMOVAL Bilateral 08/12/2017   Procedure: REMOVAL BILATERAL BREAST IMPLANTS;  Surgeon: [place] [facility], DO;  Location: [facility];  Service: Plastics;  Laterality: Bilateral;   BREAST RECONSTRUCTION WITH PLACEMENT OF TISSUE EXPANDER AND FLEX HD (ACELLULAR HYDRATED DERMIS) Bilateral 01/09/2016   BREAST RECONSTRUCTION WITH PLACEMENT OF TISSUE EXPANDER AND FLEX HD (ACELLULAR HYDRATED DERMIS) Bilateral 01/09/2016   Procedure: BREAST RECONSTRUCTION WITH PLACEMENT OF TISSUE EXPANDER AND FLEX HD (ACELLULAR HYDRATED  DERMIS);  Surgeon: Loel Lofty Dillingham, DO;  Location: [place];  Service: Plastics;  Laterality: Bilateral;   BREAST RECONSTRUCTION WITH PLACEMENT OF TISSUE EXPANDER AND FLEX HD (ACELLULAR HYDRATED DERMIS) Bilateral 05/29/2016   Procedure: PLACEMENT OF BILATERAL TISSUE EXPANDER AND FLEX HD (ACELLULAR HYDRATED DERMIS);  Surgeon: [place] Going, DO;  Location: [place];  Service: Plastics;  Laterality: Bilateral;   BREAST REDUCTION SURGERY Bilateral 11/26/2016   Procedure: BILATERAL BREAST CAPSULE CONTRACTURE RELASE;  Surgeon: [place] Going, DO;  Location: [place];  Service: Plastics;  Laterality: Bilateral;   [place]; 1989; [place] Right x3   last one 02-01-2019 @ [facility]   EYE SURGERY Left 10/01/2020   Pneumatic retinopexy for repair of rheg RD - Dr. [facility]a Caffey   EYE SURGERY Left 10/04/2020   PPV - Dr. Bernarda Caffey   FAT GRAFTING BILATERAL BREAST  08-09-2018  _0    GAS INSERTION Left 10/04/2020   Procedure: INSERTION OF GAS;  Surgeon: [facility] Caffey, MD;  Location: [place];  Service: Ophthalmology;  Laterality: Left;   GAS/FLUID EXCHANGE Left 10/04/2020   Procedure: GAS/FLUID EXCHANGE;  Surgeon: [facility] Caffey, MD;  Location: [place];  Service: Ophthalmology;  Laterality: Left;   INCISION AND DRAINAGE OF WOUND Bilateral 02/11/2016   Procedure: IRRIGATION AND DEBRIDEMENT OF BILATERAL BREAST POCKET;  Surgeon: [place] Going, DO;  Location: [place];  Service: Plastics;  Laterality: Bilateral;   KNEE ARTHROSCOPY WITH MEDIAL MENISECTOMY Right 12/20/2019   Procedure: KNEE ARTHROSCOPY WITH MEDIAL MENISECTOMY;  Surgeon: Marchia Bond, MD;  Location: [place];  Service: Orthopedics;  Laterality: Right;   KNEE ARTHROSCOPY WITH MEDIAL MENISECTOMY Right 10/30/2021   Procedure: RIGHT KNEE ARTHROSCOPY WITH PARTIAL MEDIAL MENISECTOMY SYNOVECTOMY;  Surgeon: Leandrew Koyanagi, MD;  Location: [place];  Service: Orthopedics;  Laterality: Right;   LAPAROSCOPIC APPENDECTOMY  04-07-2011   _1    w/ Excision peritoneal lipoma and lysis adhesions   LAPAROSCOPIC CHOLECYSTECTOMY  ~ [place] Right 10/04/2020   Procedure: LASER RETINOPEXY WITH INDIRECT LASER OPTHALMOSCOPE, RIGHT EYE;  Surgeon: [facility] Caffey, MD;  Location: [place];  Service: Ophthalmology;  Laterality: Right;   LIPOSUCTION WITH LIPOFILLING Bilateral 11/26/2016   Procedure: LIPOFILLING FOR SYMMETRY;  Surgeon: [place] [facility], DO;  Location: [place];  Service: Plastics;  Laterality: Bilateral;   LIPOSUCTION WITH LIPOFILLING Bilateral 01/21/2017   Procedure: BILATERAL BREAST  LIPOFILLING FOR ASYMMETRY;  Surgeon: [place] [facility], DO;  Location: [place];  Service: Plastics;  Laterality: Bilateral;   LIPOSUCTION WITH LIPOFILLING Bilateral 06/28/2020   Procedure: Lipofilling bilateral breasts for asymmetry;  Surgeon: [place] [facility], DO;  Location: [place];  Service: Plastics;  Laterality: Bilateral;  90 min   MASTECTOMY Bilateral 01/09/2016   NIPPLE SPARING MASTECTOMY/SENTINAL LYMPH NODE BIOPSY/RECONSTRUCTION/PLACEMENT OF TISSUE EXPANDER Bilateral 01/09/2016  Procedure: BILATERAL NIPPLE SPARING MASTECTOMY WITH LEFT SENTINAL LYMPH NODE BIOPSY ;  Surgeon: [place], MD;  Location: [place];  Service: General;  Laterality: Bilateral;   PHOTOCOAGULATION WITH LASER Left 10/04/2020   Procedure: PHOTOCOAGULATION WITH LASER;  Surgeon: [facility] Caffey, MD;  Location: [place];  Service: Ophthalmology;  Laterality: Left;   REMOVAL OF BILATERAL TISSUE EXPANDERS WITH PLACEMENT OF BILATERAL BREAST IMPLANTS Bilateral 08/20/2016   Procedure: REMOVAL OF BILATERAL TISSUE EXPANDERS WITH PLACEMENT OF BILATERAL SILICONE IMPLANTS;  Surgeon: [place] [facility], DO;  Location: [place];  Service: Plastics;  Laterality: Bilateral;   REMOVAL OF BILATERAL TISSUE EXPANDERS  WITH PLACEMENT OF BILATERAL BREAST IMPLANTS Bilateral 11/05/2017   Procedure: REMOVAL OF BILATERAL TISSUE EXPANDERS WITH PLACEMENT OF BILATERAL BREAST SILICONE IMPLANTS;  Surgeon: [place] [facility], DO;  Location: [place];  Service: Plastics;  Laterality: Bilateral;   REMOVAL OF TISSUE EXPANDER Bilateral 02/11/2016   Procedure: REMOVAL OF BILATERAL TISSUE EXPANDERS AND FLEX HD REMOVAL;  Surgeon: [place] [facility], DO;  Location: [place];  Service: Plastics;  Laterality: Bilateral;   RETINAL DETACHMENT SURGERY Left 10/01/2020   Pneumatic retinopexy for repair of rheg RD - Dr. [facility]a Caffey   RETINAL DETACHMENT SURGERY Left 10/04/2020   PPV - Dr. [facility] Caffey   SCLERAL BUCKLE Left 10/04/2020   Procedure: SCLERAL BUCKLE LEFT EYE;  Surgeon: [facility] Caffey, MD;  Location: [place];  Service: Ophthalmology;  Laterality: Left;   TISSUE EXPANDER PLACEMENT Bilateral 08/12/2017   Procedure: PLACEMENT OF BILATERAL TISSUE EXPANDER;  Surgeon: [place] [facility], DO;  Location: [place];  Service: Plastics;  Laterality: Bilateral;   TOTAL KNEE ARTHROPLASTY Right 03/31/2022   Procedure: RIGHT TOTAL KNEE REPLACEMENT;  Surgeon: Leandrew Koyanagi, MD;  Location: [place];  Service: Orthopedics;  Laterality: Right;   TUBAL LIGATION Bilateral 1991   VITRECTOMY 25 GAUGE WITH SCLERAL BUCKLE Left 10/04/2020   Procedure: 25 GAUGE PARS PLANA VITRECTOMY LEFT EYE ;  Surgeon: [facility] Caffey, MD;  Location: [place];  Service: Ophthalmology;  Laterality: Left;    OB History     Gravida  4   Para  3   Term      Preterm      AB      Living  3      SAB      IAB      Ectopic      Multiple      Live Births               Home Medications    Prior to Admission medications   Medication Sig Start Date End Date Taking? Authorizing Provider  cholestyramine light 4 g POWD Take 1 packet (4 g total) by mouth 2 (two) times daily as needed. 07/21/22  Yes Shadd Dunstan  L, PA  ondansetron (ZOFRAN-ODT) 4 MG disintegrating tablet Take 1 tablet (4 mg total) by mouth every 8 (eight) hours as needed for nausea or vomiting. 07/21/22  Yes Demi Trieu L, PA  amitriptyline (ELAVIL) 150 MG tablet Take 150 mg by mouth at bedtime. 02/20/22   [provider]  buPROPion (WELLBUTRIN XL) 300 MG 24 hr tablet Take 300 mg by mouth every morning. 03/16/22   [provider]  cycloSPORINE (RESTASIS) 0.05 % ophthalmic emulsion Place 1 drop into both eyes 2 (two) times daily.    [provider]  methocarbamol (ROBAXIN-750) 750 MG tablet Take 1 tablet (750 mg total) by mouth 2 (  two) times daily as needed. Patient taking differently: Take 750 mg by mouth 2 (two) times daily as needed for muscle spasms. 03/24/22   Aundra Dubin, PA-C  potassium chloride SA (KLOR-CON M) 20 MEQ tablet Take 1 tablet (20 mEq total) by mouth 2 (two) times daily. 99/37/16   Delora Fuel, MD  REXULTI 2 MG TABS tablet Take 2 mg by mouth at bedtime. 03/09/22   [provider]  RIVAROXABAN Alveda Reasons) VTE STARTER PACK (15 & 20 MG) Follow package directions: Take one 21m tablet by mouth twice a day. On day 22, switch to one 243mtablet once a day. Take with food. 05/12/22   [place]ErDonnamarie PoagDO  Vitamin D, Ergocalciferol, (DRISDOL) 1.25 MG (50000 UNIT) CAPS capsule 1 po q wed and 1 po q sun Patient taking differently: Take 50,000 Units by mouth every 7 (seven) days. Every Sunday 03/18/22   OpMellody DanceDO    Family History Family History  Problem Relation Age of Onset   Heart failure Father    Prostate cancer Father [phone] Retinal detachment Father    Colon polyps Mother        approx 2   Other Mother        hx HPV and hysterectomy due to precancerous cells   Depression Mother    Anxiety disorder Mother    Other Sister [id number]     hx of hysterectomy for unspecified reason; still has ovaries   Other Sister [phone]     paternal half-sister hx of hysterectomy for unspecified reason;  still has ovaries   Bladder Cancer Maternal Uncle 79       not a smoker   Kidney failure Maternal Grandmother    Congestive Heart Failure Maternal Grandmother    Colon cancer Maternal Grandmother 70   Diabetes Maternal Grandmother    Lung cancer Maternal Grandfather [id number]     smoker   Breast cancer Paternal Grandmother        dx. early 4034sw/ hx of trauma to breast   Crohn's disease Daughter    Lung cancer Maternal Uncle [id number]     smoker    Social History Social History   Tobacco Use   Smoking status: Former    Packs/day: 1.00    Years: 10.00    Total pack years: 10.00    Types: Cigarettes    Quit date: 06/08/2008    Years since quitting: 14.1   Smokeless tobacco: Never  Vaping Use   Vaping Use: Never used  Substance Use Topics   Alcohol use: No   Drug use: Never     Allergies   Patient has no known allergies.   Review of Systems Review of Systems As per HPI  Physical Exam Triage Vital Signs ED Triage Vitals [07/21/22 1405]  Enc Vitals Group     BP 110/62     Pulse Rate 100     Resp 18     Temp 98.7 F (37.1 C)     Temp Source Oral     SpO2 97 %     Weight      Height      Head Circumference      Peak Flow      Pain Score 7     Pain Loc      Pain Edu?      [place] in [place]   No data found.  Updated Vital Signs BP  110/62 (BP Location: Left Arm)   Pulse 100   Temp 98.7 F (37.1 C) (Oral)   Resp 18   LMP  (LMP Unknown)   SpO2 97%   Visual Acuity Right Eye Distance:   Left Eye Distance:   Bilateral Distance:    Right Eye Near:   Left Eye Near:    Bilateral Near:     Physical Exam Vitals and nursing note reviewed.  Constitutional:      General: She is not in acute distress.    Appearance: Normal appearance. She is well-developed. She is not ill-appearing, toxic-appearing or diaphoretic.  HENT:     Head: Normocephalic and atraumatic.     Right Ear: Tympanic membrane, ear canal and external ear normal. There is no impacted cerumen.      Left Ear: Tympanic membrane, ear canal and external ear normal. There is no impacted cerumen.     Nose: Nose normal. No congestion or rhinorrhea.     Mouth/Throat:     Mouth: Mucous membranes are dry.     Pharynx: Oropharynx is clear. No oropharyngeal exudate or posterior oropharyngeal erythema.  Eyes:     General: No scleral icterus.       Right eye: No discharge.        Left eye: No discharge.     Extraocular Movements: Extraocular movements intact.     Conjunctiva/sclera: Conjunctivae normal.     Pupils: Pupils are equal, round, and reactive to light.  Cardiovascular:     Rate and Rhythm: Normal rate and regular rhythm.     Pulses: Normal pulses.     Heart sounds: No murmur heard. Pulmonary:     Effort: Pulmonary effort is normal. No respiratory distress.     Breath sounds: Normal breath sounds. No stridor. No wheezing, rhonchi or rales.  Abdominal:     General: Abdomen is flat. Bowel sounds are normal. There is no distension.     Palpations: Abdomen is soft. There is no mass.     Tenderness: There is no abdominal tenderness. There is no right CVA tenderness, guarding or rebound.     Hernia: No hernia is present.  Musculoskeletal:        General: No swelling.     Cervical back: Normal range of motion and neck supple. No rigidity.  Lymphadenopathy:     Cervical: No cervical adenopathy.  Skin:    General: Skin is warm and dry.     Capillary Refill: Capillary refill takes less than 2 seconds.     Coloration: Skin is not jaundiced.     Findings: No bruising, erythema or rash.     Comments: No skin tenting  Neurological:     General: No focal deficit present.     Mental Status: She is alert and oriented to person, place, and time.  Psychiatric:        Mood and Affect: Mood normal.      UC Treatments / Results  Labs (all labs ordered are listed, but only abnormal results are displayed) Labs Reviewed  POCT INFLUENZA A/B    EKG   Radiology No results  found.  Procedures Procedures (including critical care time)  Medications Ordered in UC Medications  ondansetron (ZOFRAN-ODT) disintegrating tablet 4 mg (4 mg Oral Given 07/21/22 1434)    Initial Impression / Assessment and Plan / UC Course  I have reviewed the triage vital signs and the nursing notes.  Pertinent labs & imaging results that were available during my care  of the patient were reviewed by me and considered in my medical decision making (see chart for details).     Diarrhea - acute onset within the past 12 hours. Pt does have noted dry mouth on exam, however pt states this is chronic and unrelated to her current diarrhea. Forgot to use her normal Biotene this morning. Otherwise VSS and no other s/sx of dehydration. Suspect viral cause. Supportive measures reviewed. Brat diet Generalized body aches - flu test in office negative. No additional URI sx therefore covid testing not performed. RTC precautions discussed.   Final Clinical Impressions(s) / UC Diagnoses   Final diagnoses:  Diarrhea, unspecified type  Generalized body aches     Discharge Instructions      Your flu test is negative. It sounds like you have viral gastroenteritis. Please read the attached handout. Use the cholestyramine to help bulk in your stool on an as-needed basis.  Use the antinausea medication also on an as-needed basis. You were given your first dose at 2:45pm, so you can take your next one around 10:45pm if needed. Over the counter Oscillococcinum may help improve your body aches.  Out of work for up to three days, you may return sooner if symptoms improve.     ED Prescriptions     Medication Sig Dispense Auth. Provider   ondansetron (ZOFRAN-ODT) 4 MG disintegrating tablet Take 1 tablet (4 mg total) by mouth every 8 (eight) hours as needed for nausea or vomiting. 20 tablet Osmel Dykstra L, PA   cholestyramine light 4 g POWD Take 1 packet (4 g total) by mouth 2 (two) times daily  as needed. 50 g Roslynn Holte L, [place]      PDMP not reviewed this encounter.   Chaney Malling, [place] 07/23/22 [phone]

## 2022-07-23 ENCOUNTER — Ambulatory Visit: Payer: Commercial Managed Care - HMO

## 2022-07-25 ENCOUNTER — Ambulatory Visit: Payer: Commercial Managed Care - HMO

## 2022-07-25 DIAGNOSIS — R600 Localized edema: Secondary | ICD-10-CM | POA: Diagnosis not present

## 2022-07-25 DIAGNOSIS — R262 Difficulty in walking, not elsewhere classified: Secondary | ICD-10-CM

## 2022-07-25 NOTE — Therapy (Signed)
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,  multifocal IDC, DCIS, ER/PR+,  01-09-2016 s/p bilteral mastectomies w/ left sln bx;  no chemoradiation   Cataracts, bilateral    Depression    Diabetes mellitus without complication ([place])    type 2   GAD (generalized anxiety disorder)    Gallbladder problem    History of ovarian cyst    IBS (irritable bowel syndrome)    Joint pain    PONV (postoperative nausea and vomiting)    does well with scop patch   Retinal detachment    Rheg OS   Right knee meniscal tear    Urgency of urination    Past Surgical History:  Procedure Laterality Date   ABDOMINAL HYSTERECTOMY  05/1996   endometriosis   ACHILLES TENDON REPAIR Right 2010;  revision 2011   BLADDER SUSPENSION  2000   BREAST BIOPSY Left 10/2015   BREAST IMPLANT REMOVAL Bilateral 08/12/2017   Procedure: REMOVAL BILATERAL BREAST IMPLANTS;  Surgeon: Wallace Going, DO;  Location: [place];  Service: Plastics;  Laterality: Bilateral;   BREAST RECONSTRUCTION WITH PLACEMENT OF TISSUE EXPANDER AND FLEX HD (ACELLULAR HYDRATED DERMIS) Bilateral 01/09/2016   BREAST RECONSTRUCTION WITH PLACEMENT OF TISSUE EXPANDER AND FLEX HD (ACELLULAR HYDRATED DERMIS) Bilateral 01/09/2016   Procedure: BREAST RECONSTRUCTION WITH PLACEMENT OF TISSUE EXPANDER AND FLEX HD (ACELLULAR HYDRATED  DERMIS);  Surgeon: Loel Lofty Dillingham, DO;  Location: [place];  Service: Plastics;  Laterality: Bilateral;   BREAST RECONSTRUCTION WITH PLACEMENT OF TISSUE EXPANDER AND FLEX HD (ACELLULAR HYDRATED DERMIS) Bilateral 05/29/2016   Procedure: PLACEMENT OF BILATERAL TISSUE EXPANDER AND FLEX HD (ACELLULAR HYDRATED DERMIS);  Surgeon: Wallace Going, DO;  Location: [place];  Service: Plastics;  Laterality: Bilateral;   BREAST REDUCTION SURGERY Bilateral 11/26/2016   Procedure: BILATERAL BREAST CAPSULE CONTRACTURE RELASE;  Surgeon: Wallace Going, DO;  Location: [place];  Service: Plastics;  Laterality: Bilateral;   [place]; 1989; [place] Right x3   last one 02-01-2019 [facility]   EYE SURGERY Left 10/01/2020   Pneumatic retinopexy for repair of rheg RD - Dr. Bernarda Caffey   EYE SURGERY Left 10/04/2020   PPV - Dr. Bernarda Caffey   FAT GRAFTING BILATERAL BREAST  08-09-2018  _0    GAS INSERTION Left 10/04/2020   Procedure: INSERTION OF GAS;  Surgeon: Bernarda Caffey, MD;  Location: [place];  Service: Ophthalmology;  Laterality: Left;   GAS/FLUID EXCHANGE Left 10/04/2020   Procedure: GAS/FLUID EXCHANGE;  Surgeon: Bernarda Caffey, MD;  Location: [place];  Service: Ophthalmology;  Laterality: Left;   INCISION AND DRAINAGE OF WOUND Bilateral 02/11/2016   Procedure: IRRIGATION  AND DEBRIDEMENT OF BILATERAL BREAST POCKET;  Surgeon: Wallace Going, DO;  Location: [place];  Service: Plastics;  Laterality: Bilateral;   KNEE ARTHROSCOPY WITH MEDIAL MENISECTOMY Right 12/20/2019   Procedure: KNEE ARTHROSCOPY WITH MEDIAL MENISECTOMY;  Surgeon: Marchia Bond, MD;  Location: [place];  Service: Orthopedics;  Laterality: Right;   KNEE ARTHROSCOPY WITH MEDIAL MENISECTOMY Right 10/30/2021   Procedure: RIGHT KNEE ARTHROSCOPY WITH PARTIAL MEDIAL MENISECTOMY SYNOVECTOMY;  Surgeon: Leandrew Koyanagi, MD;  Location: [place];  Service: Orthopedics;  Laterality: Right;   LAPAROSCOPIC APPENDECTOMY  04-07-2011   _0    w/ Excision peritoneal lipoma and lysis adhesions   LAPAROSCOPIC CHOLECYSTECTOMY  ~ [place] Right 10/04/2020   Procedure: LASER RETINOPEXY WITH INDIRECT LASER OPTHALMOSCOPE, RIGHT EYE;  Surgeon: Bernarda Caffey, MD;  Location: [place];  Service: Ophthalmology;  Laterality: Right;   LIPOSUCTION WITH LIPOFILLING Bilateral 11/26/2016   Procedure: LIPOFILLING FOR SYMMETRY;  Surgeon: Wallace Going, DO;  Location: [place];  Service: Plastics;  Laterality: Bilateral;   LIPOSUCTION WITH LIPOFILLING Bilateral 01/21/2017   Procedure: BILATERAL BREAST  LIPOFILLING FOR ASYMMETRY;  Surgeon: Wallace Going, DO;  Location: [place];  Service: Plastics;  Laterality: Bilateral;   LIPOSUCTION WITH LIPOFILLING Bilateral 06/28/2020   Procedure: Lipofilling bilateral breasts for asymmetry;  Surgeon: Wallace Going, DO;  Location: [place];  Service: Plastics;  Laterality: Bilateral;  90 min   MASTECTOMY Bilateral 01/09/2016   NIPPLE SPARING MASTECTOMY/SENTINAL LYMPH NODE BIOPSY/RECONSTRUCTION/PLACEMENT OF TISSUE EXPANDER Bilateral 01/09/2016   Procedure: BILATERAL NIPPLE SPARING MASTECTOMY WITH LEFT SENTINAL LYMPH NODE BIOPSY ;  Surgeon: Stark Klein, MD;  Location: [place];  Service: General;  Laterality: Bilateral;   PHOTOCOAGULATION WITH LASER Left 10/04/2020   Procedure: PHOTOCOAGULATION WITH LASER;  Surgeon: Bernarda Caffey, MD;  Location: [place];  Service: Ophthalmology;  Laterality: Left;   REMOVAL OF BILATERAL TISSUE EXPANDERS WITH PLACEMENT OF BILATERAL BREAST IMPLANTS Bilateral 08/20/2016   Procedure: REMOVAL OF BILATERAL TISSUE EXPANDERS WITH PLACEMENT OF BILATERAL SILICONE IMPLANTS;  Surgeon: Wallace Going, DO;  Location: [place];  Service: Plastics;  Laterality: Bilateral;   REMOVAL OF BILATERAL TISSUE EXPANDERS  WITH PLACEMENT OF BILATERAL BREAST IMPLANTS Bilateral 11/05/2017   Procedure: REMOVAL OF BILATERAL TISSUE EXPANDERS WITH PLACEMENT OF BILATERAL BREAST SILICONE IMPLANTS;  Surgeon: Wallace Going, DO;  Location: [place];  Service: Plastics;  Laterality: Bilateral;   REMOVAL OF TISSUE EXPANDER Bilateral 02/11/2016   Procedure: REMOVAL OF BILATERAL TISSUE EXPANDERS AND FLEX HD REMOVAL;  Surgeon: Wallace Going, DO;  Location: [place];  Service: Plastics;  Laterality: Bilateral;   RETINAL DETACHMENT SURGERY Left 10/01/2020   Pneumatic retinopexy for repair of rheg RD - Dr. Bernarda Caffey   RETINAL DETACHMENT SURGERY Left 10/04/2020   PPV - Dr. Bernarda Caffey   SCLERAL BUCKLE Left 10/04/2020   Procedure: SCLERAL BUCKLE LEFT EYE;  Surgeon: Bernarda Caffey, MD;  Location: [place];  Service: Ophthalmology;  Laterality: Left;   TISSUE EXPANDER PLACEMENT Bilateral 08/12/2017   Procedure: PLACEMENT OF BILATERAL TISSUE EXPANDER;  Surgeon: Wallace Going, DO;  Location: [place];  Service: Plastics;  Laterality: Bilateral;   TOTAL KNEE ARTHROPLASTY Right 03/31/2022   Procedure: RIGHT TOTAL KNEE REPLACEMENT;  Surgeon: Leandrew Koyanagi, MD;  Location: [place];  Service: Orthopedics;  Laterality: Right;   TUBAL LIGATION Bilateral 1991   VITRECTOMY 25  GAUGE WITH SCLERAL BUCKLE Left 10/04/2020   Procedure: 64 GAUGE PARS PLANA VITRECTOMY LEFT EYE ;  Surgeon: Bernarda Caffey, MD;  Location: [place];  Service: Ophthalmology;  Laterality: Left;   Patient Active Problem List   Diagnosis Date Noted   DVT (deep venous thrombosis) ([place]) 05/11/2022   Acute pulmonary embolism ([place]) 05/09/2022   Chest pain 05/09/2022   Elevated troponin 05/09/2022   Status post total right knee replacement 03/31/2022   Primary osteoarthritis of right knee 03/20/2022   Eating disorder 03/20/2022   Acute medial meniscus tear, right, initial encounter 10/01/2021   At risk for dehydration  01/28/2021   At risk for malnutrition 01/01/2021   Obesity, Class I, BMI 30-34.9 12/31/2020   Vitamin B12 deficiency 10/08/2020   At risk for depression 10/08/2020   Mood disorder ([place]), with emotional eating 09/24/2020   At risk for impaired metabolic function 55/97/4163   At risk for diabetes mellitus 07/30/2020   Vitamin D deficiency 07/02/2020   Depression 07/02/2020   At risk for side effect of medication 07/02/2020   Stress due to illness of family member-  daughter with drug addiction 05/15/2020   Depression, recurrent ([place]) 05/15/2020   Anemia 05/15/2020   Constipation 05/15/2020   Prediabetes 05/15/2020   Breast asymmetry following reconstructive surgery 04/24/2020   Acquired absence of breast 04/24/2020   S/P breast reconstruction, bilateral 04/24/2020   Acute medial meniscus tear of left knee 12/20/2019   Breast cancer, left ([place]) 01/09/2016   Genetic testing 84/53/6468   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   Family history of breast cancer in female 11/08/2015   Family history of colon cancer 11/08/2015   Malignant neoplasm of central portion of left breast in female, estrogen receptor positive ([place]) 10/31/2015   Endometriosis 03/11/2011    PCP: Maurice Small, MD  REFERRING PROVIDER: Nicholas Lose, MD   REFERRING DIAG: M79.89 (ICD-10-CM) - Right leg swelling   THERAPY DIAG:  Localized edema  Difficulty in walking, not elsewhere classified  ONSET DATE: 03/31/22  Rationale for Evaluation and Treatment Rehabilitation  SUBJECTIVE:                                                                                                                                                                                           SUBJECTIVE STATEMENT: I took the bandages off on Sunday. I had a virus and was not wrapped since the wraps came off on Sunday.[place] Kitchen   PERTINENT HISTORY:  Right lower extremity swelling/ lymphedema with known right lower extremity DVT 05/12/22 and hx of  right total knee 03/31/22, L breast cancer in 2017 with prophylactic bilateral mastectomy  PAIN:  Are you having pain? No, but knee pain with walking up to 8/10, less so when bandaged, down to a 4/10  PRECAUTIONS: Other: R TKA 03/31/22  WEIGHT BEARING RESTRICTIONS: No  FALLS:  Has patient fallen in last 6 months? 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  LEISURE: pt does not exercise  HAND DOMINANCE: right   PRIOR LEVEL OF FUNCTION: Independent  PATIENT GOALS: to walk without a cane, to walk where it does not hurt as much   OBJECTIVE:  COGNITION: Overall cognitive status: Within functional limits for tasks assessed   PALPATION: Fibrosis throughout R LE, pitting edema  OBSERVATIONS / OTHER ASSESSMENTS: RLE from knee down approx twice the size of left    LOWER EXTREMITY LANDMARK RIGHT eval 07/04/22 07/07/22 07/11/22 07/18/22  At groin       30 cm proximal to suprapatella       20 cm proximal to suprapatella 55 55 55 55.4 57.2  10 cm proximal to suprapatella 48 47 47.5 46.6 48.2  At midpatella / popliteal crease 43.9 44 43 42 42.6  30 cm proximal to floor at lateral plantar foot 39.8 37.5 39.6 36.5 34.9  20 cm proximal to floor at lateral plantar foot 31.9 31 30.7 28.6 26.9  10 cm proximal to floor at lateral plantar foot 26.5 24.7 25 22.6 23.1  Circumference of ankle/heel       5 cm proximal to 1st MTP joint 22._0 21.9 22.5  Across MTP joint 23.1 23 22.2 22 22.3  Around proximal great toe 8.6 8.6 8.4 8.4 8.3  (Blank rows = not tested)  LOWER EXTREMITY LANDMARK LEFT eval  At groin   30 cm proximal to suprapatella   20 cm proximal to suprapatella 56.2  10 cm proximal to suprapatella 47.5  At  midpatella / popliteal crease 40.5  30 cm proximal to floor at lateral plantar foot 36.9  20 cm proximal to floor at lateral plantar foot 26.8  10 cm proximal to floor at lateral plantar foot 21.9  Circumference of ankle/heel   5 cm proximal to 1st MTP joint 22.1  Across MTP joint 22.2  Around proximal great toe 8.3  (Blank rows = not tested)  TODAY'S TREATMENT:                                                                                                                           DATE:  07/25/2022 MLD: in supine with HOB elevated as follows: short neck, No abdominals due to earlier stomach virus, Rt inguinal nodes and then R LE working proximal to distal, intact sequence, moving fluid towards inguinal nodes.  Compression bandaging to RLE in sitting: Cocoa butter applied, NEW TG  soft from foot to mid thigh, elastomull to digits 1-5, artiflex around foot/ankle, rosidal from just below knee to mid thigh, 1 6cm bandage at foot, 1 8cm bandage at ankle, 1 12 cm from foot to knee, 1 12 cm from lower leg to mid thigh with spiral, 1 12 cm from lower leg to mid thigh   07/18/22 Manual Therapy MLD: in supine with HOB elevated as follows: short neck, superficial and deep abdominals, Rt inguinal nodes and then R LE working proximal to distal, intact sequence, moving fluid towards inguinal nodes.  Compression bandaging to RLE in supine: Cocoa butter applied, TG soft from foot to mid thigh, elastomull to digits 1-4, artiflex around foot/ankle, rosidal from just below knee to mid thigh, 1 6cm bandage at foot, 1 8cm bandage at ankle, 1 12 cm from foot to knee, 1 12 cm from lower leg to mid thigh with spiral, 1 12 cm from lower leg to mid thigh  07/16/22 Manual Therapy MLD: in supine with HOB elevated as follows: short neck, superficial and deep abdominals, Rt inguinal nodes and then R LE working proximal to distal, intact sequence, moving fluid towards inguinal nodes.  Compression bandaging to RLE in supine:  Cocoa butter applied,  TG soft from foot to mid thigh, elastomull to digits 1-4, artiflex around foot/ankle, rosidal from just below knee to mid thigh, 1 6cm bandage at foot, 1 8cm bandage at ankle, 1 12 cm from foot to knee, 1 12 cm from lower leg to mid thigh with spiral, 1 12 cm from lower leg to mid thigh  07/14/22 Orthotic Fit At beginning of session measured pt for a [employer] and she fits well into the size of Large and tall for length. She would like to see if her insurance coverage assessed so will fax her demographics to one of our DME companies to verify her coverage, per pt request.  Manual Therapy MLD: in supine with HOB elevated as follows: short neck, superficial and deep abdominals, Rt inguinal nodes and then R LE working proximal to distal, intact sequence, moving fluid towards inguinal nodes.  Compression bandaging to RLE in supine: Cocoa butter applied, TG soft from foot to mid thigh, elastomull to digits 1-4, artiflex around foot/ankle, rosidal from just below knee to mid thigh, 1 6cm bandage at foot, 1 8cm bandage at ankle, 1 12 cm from foot to knee, 1 12 cm from lower leg to mid thigh with spiral, 1 12 cm from lower leg to mid thigh   07/11/2022 Removed bandages. Washed leg with soap and water and remeasured circumferences.  MLD: in supine with head on 2 pillows as follows: short neck, 5 diaphragmatic breaths, R inguinal nodes and then R LE working proximal to distal moving fluid towards inguinal nodes while educating pt on basic principles of MLD.  Compression bandaging to RLE in sitting: TG soft from foot to mid thigh, mollelast to digits 1-5, artiflex from foot to knee, rosidal from just below knee to mid thigh, 1 6cm bandage at foot, 1 8cm bandage at ankle, 1 12 cm from foot to knee, 1 12 cm from lower leg to mid thigh  with spiral, 1 12 cm from lower leg to mid thigh with spiral. Pt donned sock and sandal.   07/09/2022 Pt arrived to appt with compression bandages  removed because she took a shower this morning. MLD: in supine with head on 2 pillows as follows: short neck, 5 diaphragmatic breaths, R inguinal nodes and then  R LE working proximal to distal moving fluid towards inguinal nodes while educating pt on basic principles of MLD.  Compression bandaging to RLE in sitting: TG soft from foot to mid thigh, mollelast to digits 1-5, artiflex from foot to knee, rosidal from just below knee to mid thigh, 1 6cm bandage at foot, 1 8cm bandage at ankle, 1 12 cm from foot to knee, 1 12 cm from lower leg to mid thigh  with spiral, 1 12 cm from lower leg to mid thigh with spiral. Gave pt blue footie for her right LE.    PATIENT EDUCATION:  Education details: causes of edema, how a DVT can lead to prolonged edema in combination with recent surgery, need for bandaging and then eventually compression garments Person educated: Patient Education method: Explanation Education comprehension: verbalized understanding  HOME EXERCISE PROGRAM: Wear compression bandaging 24/7 and remove if pain or numbness that does not go away when you move  ASSESSMENT:  CLINICAL IMPRESSION: Did not measure secondary to being out of wraps for so long however leg does still look quite good.. continued CDT but did not do abdominals due to stomach virus earlier this week, OBJECTIVE IMPAIRMENTS: decreased knowledge of condition, decreased knowledge of use of DME, increased edema, and pain.   ACTIVITY LIMITATIONS: standing and standing, walking  PARTICIPATION LIMITATIONS: occupation  PERSONAL FACTORS:  none  are also affecting patient's functional outcome.   REHAB POTENTIAL: Excellent  CLINICAL DECISION MAKING: Stable/uncomplicated  EVALUATION COMPLEXITY: Low  GOALS: Goals reviewed with patient? Yes  SHORT TERM GOALS=LONG TERM GOALS Target date: 08/22/2022    Pt will demonstrate a 4 cm decrease in edema 10 cm proximal to lateral malleoli to improve comfort.  Baseline: 26.5cm   Goal status: INITIAL  2.  Pt will obtain appropriate compression garments for long term management of edema.  Baseline:  Goal status: INITIAL  3.  Pt will be independent in self MLD for long term management of edema.  Baseline:  Goal status: INITIAL  4.  Pt will report a 75% improvement in discomfort and pain in RLE to allow improved ability to walk and stand.  Baseline:  Goal status: INITIAL  PLAN:  PT FREQUENCY: 3x/week  PT DURATION: 4 weeks  PLANNED INTERVENTIONS: Therapeutic exercises, Therapeutic activity, Patient/Family education, Self Care, Orthotic/Fit training, Manual lymph drainage, Compression bandaging, Taping, Vasopneumatic device, and Manual therapy  PLAN FOR NEXT SESSION: Heard back from compression garment company? Cont CDT to [place], PT 07/25/2022, 11:02 AM

## 2022-07-28 ENCOUNTER — Ambulatory Visit: Payer: Commercial Managed Care - HMO

## 2022-07-28 DIAGNOSIS — R262 Difficulty in walking, not elsewhere classified: Secondary | ICD-10-CM

## 2022-07-28 DIAGNOSIS — R600 Localized edema: Secondary | ICD-10-CM | POA: Diagnosis not present

## 2022-07-28 NOTE — Therapy (Signed)
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,  multifocal IDC, DCIS, ER/PR+,  01-09-2016 s/p bilteral mastectomies w/ left sln bx;  no chemoradiation   Cataracts, bilateral    Depression    Diabetes mellitus without complication ([place])    type 2   GAD (generalized anxiety disorder)    Gallbladder problem    History of ovarian cyst    IBS (irritable bowel syndrome)    Joint pain    PONV (postoperative nausea and vomiting)    does well with scop patch   Retinal detachment    Rheg OS   Right knee meniscal tear    Urgency of urination    Past Surgical History:  Procedure Laterality Date   ABDOMINAL HYSTERECTOMY  05/1996   endometriosis   ACHILLES TENDON REPAIR Right 2010;  revision 2011   BLADDER SUSPENSION  2000   BREAST BIOPSY Left 10/2015   BREAST IMPLANT REMOVAL Bilateral 08/12/2017   Procedure: REMOVAL BILATERAL BREAST IMPLANTS;  Surgeon: Wallace Going, DO;  Location: [place];  Service: Plastics;  Laterality: Bilateral;   BREAST RECONSTRUCTION WITH PLACEMENT OF TISSUE EXPANDER AND FLEX HD (ACELLULAR HYDRATED DERMIS) Bilateral 01/09/2016   BREAST RECONSTRUCTION WITH PLACEMENT OF TISSUE EXPANDER AND FLEX HD (ACELLULAR HYDRATED DERMIS) Bilateral 01/09/2016   Procedure: BREAST RECONSTRUCTION WITH PLACEMENT OF TISSUE EXPANDER AND FLEX HD (ACELLULAR HYDRATED  DERMIS);  Surgeon: Loel Lofty Dillingham, DO;  Location: [place];  Service: Plastics;  Laterality: Bilateral;   BREAST RECONSTRUCTION WITH PLACEMENT OF TISSUE EXPANDER AND FLEX HD (ACELLULAR HYDRATED DERMIS) Bilateral 05/29/2016   Procedure: PLACEMENT OF BILATERAL TISSUE EXPANDER AND FLEX HD (ACELLULAR HYDRATED DERMIS);  Surgeon: Wallace Going, DO;  Location: [place];  Service: Plastics;  Laterality: Bilateral;   BREAST REDUCTION SURGERY Bilateral 11/26/2016   Procedure: BILATERAL BREAST CAPSULE CONTRACTURE RELASE;  Surgeon: Wallace Going, DO;  Location: [place];  Service: Plastics;  Laterality: Bilateral;   [place]; 1989; [place] Right x3   last one 02-01-2019 @ [facility]   EYE SURGERY Left 10/01/2020   Pneumatic retinopexy for repair of rheg RD - Dr. Bernarda Caffey   EYE SURGERY Left 10/04/2020   PPV - Dr. Bernarda Caffey   FAT GRAFTING BILATERAL BREAST  08-09-2018  _0    GAS INSERTION Left 10/04/2020   Procedure: INSERTION OF GAS;  Surgeon: Bernarda Caffey, MD;  Location: [place];  Service: Ophthalmology;  Laterality: Left;   GAS/FLUID EXCHANGE Left 10/04/2020   Procedure: GAS/FLUID EXCHANGE;  Surgeon: Bernarda Caffey, MD;  Location: [place];  Service: Ophthalmology;  Laterality: Left;   INCISION AND DRAINAGE OF WOUND Bilateral 02/11/2016   Procedure: IRRIGATION  AND DEBRIDEMENT OF BILATERAL BREAST POCKET;  Surgeon: Wallace Going, DO;  Location: [place];  Service: Plastics;  Laterality: Bilateral;   KNEE ARTHROSCOPY WITH MEDIAL MENISECTOMY Right 12/20/2019   Procedure: KNEE ARTHROSCOPY WITH MEDIAL MENISECTOMY;  Surgeon: Marchia Bond, MD;  Location: [place];  Service: Orthopedics;  Laterality: Right;   KNEE ARTHROSCOPY WITH MEDIAL MENISECTOMY Right 10/30/2021   Procedure: RIGHT KNEE ARTHROSCOPY WITH PARTIAL MEDIAL MENISECTOMY SYNOVECTOMY;  Surgeon: Leandrew Koyanagi, MD;  Location: [place];  Service: Orthopedics;  Laterality: Right;   LAPAROSCOPIC APPENDECTOMY  04-07-2011   _0    w/ Excision peritoneal lipoma and lysis adhesions   LAPAROSCOPIC CHOLECYSTECTOMY  ~ [place] Right 10/04/2020   Procedure: LASER RETINOPEXY WITH INDIRECT LASER OPTHALMOSCOPE, RIGHT EYE;  Surgeon: Bernarda Caffey, MD;  Location: [place];  Service: Ophthalmology;  Laterality: Right;   LIPOSUCTION WITH LIPOFILLING Bilateral 11/26/2016   Procedure: LIPOFILLING FOR SYMMETRY;  Surgeon: Wallace Going, DO;  Location: [place];  Service: Plastics;  Laterality: Bilateral;   LIPOSUCTION WITH LIPOFILLING Bilateral 01/21/2017   Procedure: BILATERAL BREAST  LIPOFILLING FOR ASYMMETRY;  Surgeon: Wallace Going, DO;  Location: [place];  Service: Plastics;  Laterality: Bilateral;   LIPOSUCTION WITH LIPOFILLING Bilateral 06/28/2020   Procedure: Lipofilling bilateral breasts for asymmetry;  Surgeon: Wallace Going, DO;  Location: [place];  Service: Plastics;  Laterality: Bilateral;  90 min   MASTECTOMY Bilateral 01/09/2016   NIPPLE SPARING MASTECTOMY/SENTINAL LYMPH NODE BIOPSY/RECONSTRUCTION/PLACEMENT OF TISSUE EXPANDER Bilateral 01/09/2016   Procedure: BILATERAL NIPPLE SPARING MASTECTOMY WITH LEFT SENTINAL LYMPH NODE BIOPSY ;  Surgeon: Stark Klein, MD;  Location: [place];  Service: General;  Laterality: Bilateral;   PHOTOCOAGULATION WITH LASER Left 10/04/2020   Procedure: PHOTOCOAGULATION WITH LASER;  Surgeon: Bernarda Caffey, MD;  Location: [place];  Service: Ophthalmology;  Laterality: Left;   REMOVAL OF BILATERAL TISSUE EXPANDERS WITH PLACEMENT OF BILATERAL BREAST IMPLANTS Bilateral 08/20/2016   Procedure: REMOVAL OF BILATERAL TISSUE EXPANDERS WITH PLACEMENT OF BILATERAL SILICONE IMPLANTS;  Surgeon: Wallace Going, DO;  Location: [place];  Service: Plastics;  Laterality: Bilateral;   REMOVAL OF BILATERAL TISSUE EXPANDERS  WITH PLACEMENT OF BILATERAL BREAST IMPLANTS Bilateral 11/05/2017   Procedure: REMOVAL OF BILATERAL TISSUE EXPANDERS WITH PLACEMENT OF BILATERAL BREAST SILICONE IMPLANTS;  Surgeon: Wallace Going, DO;  Location: [place];  Service: Plastics;  Laterality: Bilateral;   REMOVAL OF TISSUE EXPANDER Bilateral 02/11/2016   Procedure: REMOVAL OF BILATERAL TISSUE EXPANDERS AND FLEX HD REMOVAL;  Surgeon: Wallace Going, DO;  Location: [place];  Service: Plastics;  Laterality: Bilateral;   RETINAL DETACHMENT SURGERY Left 10/01/2020   Pneumatic retinopexy for repair of rheg RD - Dr. Bernarda Caffey   RETINAL DETACHMENT SURGERY Left 10/04/2020   PPV - Dr. Bernarda Caffey   SCLERAL BUCKLE Left 10/04/2020   Procedure: SCLERAL BUCKLE LEFT EYE;  Surgeon: Bernarda Caffey, MD;  Location: [place];  Service: Ophthalmology;  Laterality: Left;   TISSUE EXPANDER PLACEMENT Bilateral 08/12/2017   Procedure: PLACEMENT OF BILATERAL TISSUE EXPANDER;  Surgeon: Wallace Going, DO;  Location: [place];  Service: Plastics;  Laterality: Bilateral;   TOTAL KNEE ARTHROPLASTY Right 03/31/2022   Procedure: RIGHT TOTAL KNEE REPLACEMENT;  Surgeon: Leandrew Koyanagi, MD;  Location: [place];  Service: Orthopedics;  Laterality: Right;   TUBAL LIGATION Bilateral 1991   VITRECTOMY 25  GAUGE WITH SCLERAL BUCKLE Left 10/04/2020   Procedure: 6 GAUGE PARS PLANA VITRECTOMY LEFT EYE ;  Surgeon: Bernarda Caffey, MD;  Location: [place];  Service: Ophthalmology;  Laterality: Left;   Patient Active Problem List   Diagnosis Date Noted   DVT (deep venous thrombosis) ([place]) 05/11/2022   Acute pulmonary embolism ([place]) 05/09/2022   Chest pain 05/09/2022   Elevated troponin 05/09/2022   Status post total right knee replacement 03/31/2022   Primary osteoarthritis of right knee 03/20/2022   Eating disorder 03/20/2022   Acute medial meniscus tear, right, initial encounter 10/01/2021   At risk for dehydration  01/28/2021   At risk for malnutrition 01/01/2021   Obesity, Class I, BMI 30-34.9 12/31/2020   Vitamin B12 deficiency 10/08/2020   At risk for depression 10/08/2020   Mood disorder ([place]), with emotional eating 09/24/2020   At risk for impaired metabolic function 01/75/1025   At risk for diabetes mellitus 07/30/2020   Vitamin D deficiency 07/02/2020   Depression 07/02/2020   At risk for side effect of medication 07/02/2020   Stress due to illness of family member-  daughter with drug addiction 05/15/2020   Depression, recurrent ([place]) 05/15/2020   Anemia 05/15/2020   Constipation 05/15/2020   Prediabetes 05/15/2020   Breast asymmetry following reconstructive surgery 04/24/2020   Acquired absence of breast 04/24/2020   S/P breast reconstruction, bilateral 04/24/2020   Acute medial meniscus tear of left knee 12/20/2019   Breast cancer, left ([place]) 01/09/2016   Genetic testing 85/27/7824   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   Family history of breast cancer in female 11/08/2015   Family history of colon cancer 11/08/2015   Malignant neoplasm of central portion of left breast in female, estrogen receptor positive ([place]) 10/31/2015   Endometriosis 03/11/2011    PCP: Maurice Small, MD  REFERRING PROVIDER: Nicholas Lose, MD   REFERRING DIAG: M79.89 (ICD-10-CM) - Right leg swelling   THERAPY DIAG:  Localized edema  Difficulty in walking, not elsewhere classified  ONSET DATE: 03/31/22  Rationale for Evaluation and Treatment Rehabilitation  SUBJECTIVE:                                                                                                                                                                                           SUBJECTIVE STATEMENT: I haven't heard anything from the compression company.   PERTINENT HISTORY:  Right lower extremity swelling/ lymphedema with known right lower extremity DVT 05/12/22 and hx of right total knee 03/31/22, L breast cancer in 2017  with prophylactic bilateral mastectomy PAIN:  Are you having pain? No, but knee pain with walking up to  8/10, less so when bandaged, down to a 4/10  PRECAUTIONS: Other: R TKA 03/31/22  WEIGHT BEARING RESTRICTIONS: No  FALLS:  Has patient fallen in last 6 months? 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  LEISURE: pt does not exercise  HAND DOMINANCE: right   PRIOR LEVEL OF FUNCTION: Independent  PATIENT GOALS: to walk without a cane, to walk where it does not hurt as much   OBJECTIVE:  COGNITION: Overall cognitive status: Within functional limits for tasks assessed   PALPATION: Fibrosis throughout R LE, pitting edema  OBSERVATIONS / OTHER ASSESSMENTS: RLE from knee down approx twice the size of left    LOWER EXTREMITY LANDMARK RIGHT eval 07/04/22 07/07/22 07/11/22 07/18/22  At groin       30 cm proximal to suprapatella       20 cm proximal to suprapatella 55 55 55 55.4 57.2  10 cm proximal to suprapatella 48 47 47.5 46.6 48.2  At midpatella / popliteal crease 43.9 44 43 42 42.6  30 cm proximal to floor at lateral plantar foot 39.8 37.5 39.6 36.5 34.9  20 cm proximal to floor at lateral plantar foot 31.9 31 30.7 28.6 26.9  10 cm proximal to floor at lateral plantar foot 26.5 24.7 25 22.6 23.1  Circumference of ankle/heel       5 cm proximal to 1st MTP joint 22._0 21.9 22.5  Across MTP joint 23.1 23 22.2 22 22.3  Around proximal great toe 8.6 8.6 8.4 8.4 8.3  (Blank rows = not tested)  LOWER EXTREMITY LANDMARK LEFT eval  At groin   30 cm proximal to suprapatella   20 cm proximal to suprapatella 56.2  10 cm proximal to suprapatella 47.5  At midpatella / popliteal crease 40.5  30 cm proximal  to floor at lateral plantar foot 36.9  20 cm proximal to floor at lateral plantar foot 26.8  10 cm proximal to floor at lateral plantar foot 21.9  Circumference of ankle/heel   5 cm proximal to 1st MTP joint 22.1  Across MTP joint 22.2  Around proximal great toe 8.3  (Blank rows = not tested)  TODAY'S TREATMENT:                                                                                                                           DATE:  07/28/22: MLD: in supine with HOB elevated as follows: short neck, superficial and deep abdominals, Rt inguinal nodes and then R LE working proximal to distal, intact sequence, moving fluid towards inguinal nodes.  Compression bandaging to RLE in sitting: Cocoa butter applied, NEW TG soft from foot to mid thigh, elastomull to digits 1-4, artiflex around foot/ankle, rosidal from just below  knee to mid thigh, 1 6cm bandage at foot, 1 8cm bandage at ankle, 1 12 cm from foot to knee, 1 12 cm from lower leg to mid thigh with spiral, 1 12 cm from lower leg to mid thigh  07/25/2022 MLD: in supine with HOB elevated as follows: short neck, No abdominals due to earlier stomach virus, Rt inguinal nodes and then R LE working proximal to distal, intact sequence, moving fluid towards inguinal nodes.  Compression bandaging to RLE in sitting: Cocoa butter applied, NEW TG soft from foot to mid thigh, elastomull to digits 1-5, artiflex around foot/ankle, rosidal from just below knee to mid thigh, 1 6cm bandage at foot, 1 8cm bandage at ankle, 1 12 cm from foot to knee, 1 12 cm from lower leg to mid thigh with spiral, 1 12 cm from lower leg to mid thigh   07/18/22 Manual Therapy MLD: in supine with HOB elevated as follows: short neck, superficial and deep abdominals, Rt inguinal nodes and then R LE working proximal to distal, intact sequence, moving fluid towards inguinal nodes.  Compression bandaging to RLE in supine: Cocoa butter applied, TG soft from foot to mid thigh,  elastomull to digits 1-4, artiflex around foot/ankle, rosidal from just below knee to mid thigh, 1 6cm bandage at foot, 1 8cm bandage at ankle, 1 12 cm from foot to knee, 1 12 cm from lower leg to mid thigh with spiral, 1 12 cm from lower leg to mid thigh  07/16/22 Manual Therapy MLD: in supine with HOB elevated as follows: short neck, superficial and deep abdominals, Rt inguinal nodes and then R LE working proximal to distal, intact sequence, moving fluid towards inguinal nodes.  Compression bandaging to RLE in supine: Cocoa butter applied,  TG soft from foot to mid thigh, elastomull to digits 1-4, artiflex around foot/ankle, rosidal from just below knee to mid thigh, 1 6cm bandage at foot, 1 8cm bandage at ankle, 1 12 cm from foot to knee, 1 12 cm from lower leg to mid thigh with spiral, 1 12 cm from lower leg to mid thigh  07/14/22 Orthotic Fit At beginning of session measured pt for a [employer] and she fits well into the size of Large and tall for length. She would like to see if her insurance coverage assessed so will fax her demographics to one of our DME companies to verify her coverage, per pt request.  Manual Therapy MLD: in supine with HOB elevated as follows: short neck, superficial and deep abdominals, Rt inguinal nodes and then R LE working proximal to distal, intact sequence, moving fluid towards inguinal nodes.  Compression bandaging to RLE in supine: Cocoa butter applied, TG soft from foot to mid thigh, elastomull to digits 1-4, artiflex around foot/ankle, rosidal from just below knee to mid thigh, 1 6cm bandage at foot, 1 8cm bandage at ankle, 1 12 cm from foot to knee, 1 12 cm from lower leg to mid thigh with spiral, 1 12 cm from lower leg to mid thigh      PATIENT EDUCATION:  Education details: causes of edema, how a DVT can lead to prolonged edema in combination with recent surgery, need for bandaging and then eventually compression garments Person educated:  Patient Education method: Explanation Education comprehension: verbalized understanding  HOME EXERCISE PROGRAM: Wear compression bandaging 24/7 and remove if pain or numbness that does not go away when you move  ASSESSMENT:  CLINICAL IMPRESSION: Will measure pt on Wednesday appt of  this week. She is being compliant with wear of her compression bandages and we are just waiting to hear about her insurance coverage from [facility]. This therapist called and left a VM with the rep last week to no avail and this morning sent 2 emails to 2 different customer service emails in hopes of hearing something back for patient. Continued with CDT of Rt LE. Update: did receive email from [facility] that they had reached out to pt 11/16. She reports must have thought it was spam call for insurance so she will either try to call them back, or also gave pts number to [facility] so they can try her again as well. Pts OOP is $31 / pair per email. Instructed pt during MLD today on how to don garment so she can try this at home when they arrive, which may not be until next week at this point being a holiday week. If she needs to return for another session once she receives them she knows she can for [name] to assess fit or donning technique.   OBJECTIVE IMPAIRMENTS: decreased knowledge of condition, decreased knowledge of use of DME, increased edema, and pain.   ACTIVITY LIMITATIONS: standing and standing, walking  PARTICIPATION LIMITATIONS: occupation  PERSONAL FACTORS:  none  are also affecting patient's functional outcome.   REHAB POTENTIAL: Excellent  CLINICAL DECISION MAKING: Stable/uncomplicated  EVALUATION COMPLEXITY: Low  GOALS: Goals reviewed with patient? Yes  SHORT TERM GOALS=LONG TERM GOALS Target date: 08/25/2022    Pt will demonstrate a 4 cm decrease in edema 10 cm proximal to lateral malleoli to improve comfort.  Baseline: 26.5cm  Goal status: INITIAL  2.  Pt will obtain appropriate  compression garments for long term management of edema.  Baseline:  Goal status: INITIAL  3.  Pt will be independent in self MLD for long term management of edema.  Baseline:  Goal status: INITIAL  4.  Pt will report a 75% improvement in discomfort and pain in RLE to allow improved ability to walk and stand.  Baseline:  Goal status: INITIAL  PLAN:  PT FREQUENCY: 3x/week  PT DURATION: 4 weeks  PLANNED INTERVENTIONS: Therapeutic exercises, Therapeutic activity, Patient/Family education, Self Care, Orthotic/Fit training, Manual lymph drainage, Compression bandaging, Taping, Vasopneumatic device, and Manual therapy  PLAN FOR NEXT SESSION: Garments ordered? Cont CDT to Rt LE until garments arrive.    Otelia Limes, PTA 07/28/2022, 9:56 AM

## 2022-07-29 ENCOUNTER — Encounter: Payer: Self-pay | Admitting: *Deleted

## 2022-07-29 NOTE — Progress Notes (Signed)
Received certificate of medical necessity fax from [facility] requesting additional information to proceed with processing compressing stocking right lower extremity.  Form filled out, signed by provider and successfully faxed to [phone]).

## 2022-07-30 ENCOUNTER — Ambulatory Visit: Payer: Commercial Managed Care - HMO

## 2022-07-30 DIAGNOSIS — R262 Difficulty in walking, not elsewhere classified: Secondary | ICD-10-CM

## 2022-07-30 DIAGNOSIS — R600 Localized edema: Secondary | ICD-10-CM

## 2022-07-30 NOTE — Therapy (Signed)
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,  multifocal IDC, DCIS, ER/PR+,  01-09-2016 s/p bilteral mastectomies w/ left sln bx;  no chemoradiation   Cataracts, bilateral    Depression    Diabetes mellitus without complication ([place])    type 2   GAD (generalized anxiety disorder)    Gallbladder problem    History of ovarian cyst    IBS (irritable bowel syndrome)    Joint pain    PONV (postoperative nausea and vomiting)    does well with scop patch   Retinal detachment    Rheg OS   Right knee meniscal tear    Urgency of urination    Past Surgical History:  Procedure Laterality Date   ABDOMINAL HYSTERECTOMY  05/1996   endometriosis   ACHILLES TENDON REPAIR Right 2010;  revision 2011   BLADDER SUSPENSION  2000   BREAST BIOPSY Left 10/2015   BREAST IMPLANT REMOVAL Bilateral 08/12/2017   Procedure: REMOVAL BILATERAL BREAST IMPLANTS;  Surgeon: Wallace Going, DO;  Location: [place];  Service: Plastics;  Laterality: Bilateral;   BREAST RECONSTRUCTION WITH PLACEMENT OF TISSUE EXPANDER AND FLEX HD (ACELLULAR HYDRATED DERMIS) Bilateral 01/09/2016   BREAST RECONSTRUCTION WITH PLACEMENT OF TISSUE EXPANDER AND FLEX HD (ACELLULAR HYDRATED DERMIS) Bilateral 01/09/2016   Procedure: BREAST RECONSTRUCTION WITH PLACEMENT OF TISSUE EXPANDER AND FLEX HD (ACELLULAR HYDRATED  DERMIS);  Surgeon: Loel Lofty Dillingham, DO;  Location: [place];  Service: Plastics;  Laterality: Bilateral;   BREAST RECONSTRUCTION WITH PLACEMENT OF TISSUE EXPANDER AND FLEX HD (ACELLULAR HYDRATED DERMIS) Bilateral 05/29/2016   Procedure: PLACEMENT OF BILATERAL TISSUE EXPANDER AND FLEX HD (ACELLULAR HYDRATED DERMIS);  Surgeon: Wallace Going, DO;  Location: [place];  Service: Plastics;  Laterality: Bilateral;   BREAST REDUCTION SURGERY Bilateral 11/26/2016   Procedure: BILATERAL BREAST CAPSULE CONTRACTURE RELASE;  Surgeon: Wallace Going, DO;  Location: [place];  Service: Plastics;  Laterality: Bilateral;   [place]; 1989; [place] Right x3   last one 02-01-2019 @ [facility]   EYE SURGERY Left 10/01/2020   Pneumatic retinopexy for repair of rheg RD - Dr. Bernarda Caffey   EYE SURGERY Left 10/04/2020   PPV - Dr. Bernarda Caffey   FAT GRAFTING BILATERAL BREAST  08-09-2018  _0    GAS INSERTION Left 10/04/2020   Procedure: INSERTION OF GAS;  Surgeon: Bernarda Caffey, MD;  Location: [place];  Service: Ophthalmology;  Laterality: Left;   GAS/FLUID EXCHANGE Left 10/04/2020   Procedure: GAS/FLUID EXCHANGE;  Surgeon: Bernarda Caffey, MD;  Location: [place];  Service: Ophthalmology;  Laterality: Left;   INCISION AND DRAINAGE OF WOUND Bilateral 02/11/2016   Procedure: IRRIGATION  AND DEBRIDEMENT OF BILATERAL BREAST POCKET;  Surgeon: Wallace Going, DO;  Location: [place];  Service: Plastics;  Laterality: Bilateral;   KNEE ARTHROSCOPY WITH MEDIAL MENISECTOMY Right 12/20/2019   Procedure: KNEE ARTHROSCOPY WITH MEDIAL MENISECTOMY;  Surgeon: Marchia Bond, MD;  Location: [place];  Service: Orthopedics;  Laterality: Right;   KNEE ARTHROSCOPY WITH MEDIAL MENISECTOMY Right 10/30/2021   Procedure: RIGHT KNEE ARTHROSCOPY WITH PARTIAL MEDIAL MENISECTOMY SYNOVECTOMY;  Surgeon: Leandrew Koyanagi, MD;  Location: [place];  Service: Orthopedics;  Laterality: Right;   LAPAROSCOPIC APPENDECTOMY  04-07-2011   _0    w/ Excision peritoneal lipoma and lysis adhesions   LAPAROSCOPIC CHOLECYSTECTOMY  ~ [place] Right 10/04/2020   Procedure: LASER RETINOPEXY WITH INDIRECT LASER OPTHALMOSCOPE, RIGHT EYE;  Surgeon: Bernarda Caffey, MD;  Location: [place];  Service: Ophthalmology;  Laterality: Right;   LIPOSUCTION WITH LIPOFILLING Bilateral 11/26/2016   Procedure: LIPOFILLING FOR SYMMETRY;  Surgeon: Wallace Going, DO;  Location: [place];  Service: Plastics;  Laterality: Bilateral;   LIPOSUCTION WITH LIPOFILLING Bilateral 01/21/2017   Procedure: BILATERAL BREAST  LIPOFILLING FOR ASYMMETRY;  Surgeon: Wallace Going, DO;  Location: [place];  Service: Plastics;  Laterality: Bilateral;   LIPOSUCTION WITH LIPOFILLING Bilateral 06/28/2020   Procedure: Lipofilling bilateral breasts for asymmetry;  Surgeon: Wallace Going, DO;  Location: [place];  Service: Plastics;  Laterality: Bilateral;  90 min   MASTECTOMY Bilateral 01/09/2016   NIPPLE SPARING MASTECTOMY/SENTINAL LYMPH NODE BIOPSY/RECONSTRUCTION/PLACEMENT OF TISSUE EXPANDER Bilateral 01/09/2016   Procedure: BILATERAL NIPPLE SPARING MASTECTOMY WITH LEFT SENTINAL LYMPH NODE BIOPSY ;  Surgeon: Stark Klein, MD;  Location: [place];  Service: General;  Laterality: Bilateral;   PHOTOCOAGULATION WITH LASER Left 10/04/2020   Procedure: PHOTOCOAGULATION WITH LASER;  Surgeon: Bernarda Caffey, MD;  Location: [place];  Service: Ophthalmology;  Laterality: Left;   REMOVAL OF BILATERAL TISSUE EXPANDERS WITH PLACEMENT OF BILATERAL BREAST IMPLANTS Bilateral 08/20/2016   Procedure: REMOVAL OF BILATERAL TISSUE EXPANDERS WITH PLACEMENT OF BILATERAL SILICONE IMPLANTS;  Surgeon: Wallace Going, DO;  Location: [place];  Service: Plastics;  Laterality: Bilateral;   REMOVAL OF BILATERAL TISSUE EXPANDERS  WITH PLACEMENT OF BILATERAL BREAST IMPLANTS Bilateral 11/05/2017   Procedure: REMOVAL OF BILATERAL TISSUE EXPANDERS WITH PLACEMENT OF BILATERAL BREAST SILICONE IMPLANTS;  Surgeon: Wallace Going, DO;  Location: [place];  Service: Plastics;  Laterality: Bilateral;   REMOVAL OF TISSUE EXPANDER Bilateral 02/11/2016   Procedure: REMOVAL OF BILATERAL TISSUE EXPANDERS AND FLEX HD REMOVAL;  Surgeon: Wallace Going, DO;  Location: [place];  Service: Plastics;  Laterality: Bilateral;   RETINAL DETACHMENT SURGERY Left 10/01/2020   Pneumatic retinopexy for repair of rheg RD - Dr. Bernarda Caffey   RETINAL DETACHMENT SURGERY Left 10/04/2020   PPV - Dr. Bernarda Caffey   SCLERAL BUCKLE Left 10/04/2020   Procedure: SCLERAL BUCKLE LEFT EYE;  Surgeon: Bernarda Caffey, MD;  Location: [place];  Service: Ophthalmology;  Laterality: Left;   TISSUE EXPANDER PLACEMENT Bilateral 08/12/2017   Procedure: PLACEMENT OF BILATERAL TISSUE EXPANDER;  Surgeon: Wallace Going, DO;  Location: [place];  Service: Plastics;  Laterality: Bilateral;   TOTAL KNEE ARTHROPLASTY Right 03/31/2022   Procedure: RIGHT TOTAL KNEE REPLACEMENT;  Surgeon: Leandrew Koyanagi, MD;  Location: [place];  Service: Orthopedics;  Laterality: Right;   TUBAL LIGATION Bilateral 1991   VITRECTOMY 25  GAUGE WITH SCLERAL BUCKLE Left 10/04/2020   Procedure: 43 GAUGE PARS PLANA VITRECTOMY LEFT EYE ;  Surgeon: Bernarda Caffey, MD;  Location: [place];  Service: Ophthalmology;  Laterality: Left;   Patient Active Problem List   Diagnosis Date Noted   DVT (deep venous thrombosis) ([place]) 05/11/2022   Acute pulmonary embolism ([place]) 05/09/2022   Chest pain 05/09/2022   Elevated troponin 05/09/2022   Status post total right knee replacement 03/31/2022   Primary osteoarthritis of right knee 03/20/2022   Eating disorder 03/20/2022   Acute medial meniscus tear, right, initial encounter 10/01/2021   At risk for dehydration  01/28/2021   At risk for malnutrition 01/01/2021   Obesity, Class I, BMI 30-34.9 12/31/2020   Vitamin B12 deficiency 10/08/2020   At risk for depression 10/08/2020   Mood disorder ([place]), with emotional eating 09/24/2020   At risk for impaired metabolic function 63/09/6008   At risk for diabetes mellitus 07/30/2020   Vitamin D deficiency 07/02/2020   Depression 07/02/2020   At risk for side effect of medication 07/02/2020   Stress due to illness of family member-  daughter with drug addiction 05/15/2020   Depression, recurrent ([place]) 05/15/2020   Anemia 05/15/2020   Constipation 05/15/2020   Prediabetes 05/15/2020   Breast asymmetry following reconstructive surgery 04/24/2020   Acquired absence of breast 04/24/2020   S/P breast reconstruction, bilateral 04/24/2020   Acute medial meniscus tear of left knee 12/20/2019   Breast cancer, left ([place]) 01/09/2016   Genetic testing 93/23/5573   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   Family history of breast cancer in female 11/08/2015   Family history of colon cancer 11/08/2015   Malignant neoplasm of central portion of left breast in female, estrogen receptor positive ([place]) 10/31/2015   Endometriosis 03/11/2011    PCP: Maurice Small, MD  REFERRING PROVIDER: Nicholas Lose, MD   REFERRING DIAG: M79.89 (ICD-10-CM) - Right leg swelling   THERAPY DIAG:  Localized edema  Difficulty in walking, not elsewhere classified  ONSET DATE: 03/31/22  Rationale for Evaluation and Treatment Rehabilitation  SUBJECTIVE:                                                                                                                                                                                           SUBJECTIVE STATEMENT: [facility] called me right after I left here the other day and they've already been ordered. I was told it would take 3-5 days from then.    PERTINENT HISTORY:  Right lower extremity swelling/ lymphedema with known  right lower extremity DVT 05/12/22 and hx of right total knee 03/31/22, L breast  cancer in 2017 with prophylactic bilateral mastectomy PAIN:  Are you having pain? Yes NPRS scale: 5/10 Pain location: knee Pain orientation: Right  PAIN TYPE: aching Pain description: constant  Aggravating factors: maybe the weather is making hurt more consistently, standing up and first few steps always hurts more Relieving factors: tylenol, bandages   PRECAUTIONS: Other: R TKA 03/31/22  WEIGHT BEARING RESTRICTIONS: No  FALLS:  Has patient fallen in last 6 months? 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  LEISURE: pt does not exercise  HAND DOMINANCE: right   PRIOR LEVEL OF FUNCTION: Independent  PATIENT GOALS: to walk without a cane, to walk where it does not hurt as much   OBJECTIVE:  COGNITION: Overall cognitive status: Within functional limits for tasks assessed   PALPATION: Fibrosis throughout R LE, pitting edema  OBSERVATIONS / OTHER ASSESSMENTS: RLE from knee down approx twice the size of left    LOWER EXTREMITY LANDMARK RIGHT eval 07/04/22 07/07/22 07/11/22 07/18/22 07/30/22  At groin        30 cm proximal to suprapatella        20 cm proximal to suprapatella 55 55 55 55.4 57.2 56.4  10 cm proximal to suprapatella 48 47 47.5 46.6 48.2 48.4  At midpatella / popliteal crease 43.9 44 43 42 42.6 42.6  30 cm proximal to floor at lateral plantar foot 39.8 37.5 39.6 36.5 34.9 37.5  20 cm proximal to floor at lateral plantar foot 31.9 31 30.7 28.6 26.9 29.2  10 cm proximal to floor at lateral plantar foot 26.5 24.7 25 22.6 23.1 23.9  Circumference of ankle/heel        5 cm proximal to 1st MTP joint 22._0 21.9 22.5  23.4  Across MTP joint 23.1 23 22.2 22 22.3 22.4  Around proximal great toe 8.6 8.6 8.4 8.4 8.3 8.8  (Blank rows = not tested)  LOWER EXTREMITY LANDMARK LEFT eval  At groin   30 cm proximal to suprapatella   20 cm proximal to suprapatella 56.2  10 cm proximal to suprapatella 47.5  At midpatella / popliteal crease 40.5  30 cm proximal to floor at lateral plantar foot 36.9  20 cm proximal to floor at lateral plantar foot 26.8  10 cm proximal to floor at lateral plantar foot 21.9  Circumference of ankle/heel   5 cm proximal to 1st MTP joint 22.1  Across MTP joint 22.2  Around proximal great toe 8.3  (Blank rows = not tested)  TODAY'S TREATMENT:                                                                                                                           DATE:  07/30/22: Manual Therapy  MLD: in supine with HOB elevated as follows: short neck, superficial and deep abdominals, Rt inguinal nodes and then R LE working proximal to distal, intact sequence, moving fluid towards inguinal nodes.  07/28/22: MLD: in supine with HOB elevated as follows: short neck, superficial and deep abdominals, Rt inguinal nodes and then R LE working proximal to distal, intact sequence, moving fluid towards inguinal nodes.  Compression bandaging to RLE in sitting: Cocoa butter applied, NEW TG soft from foot to mid thigh, elastomull to digits 1-4, artiflex around foot/ankle, rosidal from just below knee to mid thigh, 1 6cm bandage at foot, 1 8cm bandage at ankle, 1 12 cm from foot to knee, 1 12 cm from lower leg to mid thigh with spiral, 1 12 cm from lower leg to mid thigh  07/25/2022 MLD: in supine with HOB elevated as follows: short neck, No abdominals due to earlier stomach virus, Rt inguinal nodes and then R LE working proximal to distal, intact sequence, moving fluid towards inguinal nodes.  Compression bandaging to RLE in sitting: Cocoa butter applied, NEW TG soft from foot to mid thigh, elastomull  to digits 1-5, artiflex around foot/ankle, rosidal from just below knee to mid thigh, 1 6cm bandage at foot, 1 8cm bandage at ankle, 1 12 cm from foot to knee, 1 12 cm from lower leg to mid thigh with spiral, 1 12 cm from lower leg to mid thigh   07/18/22 Manual Therapy MLD: in supine with HOB elevated as follows: short neck, superficial and deep abdominals, Rt inguinal nodes and then R LE working proximal to distal, intact sequence, moving fluid towards inguinal nodes.  Compression bandaging to RLE in supine: Cocoa butter applied, TG soft from foot to mid thigh, elastomull to digits 1-4, artiflex around foot/ankle, rosidal from just below knee to mid thigh, 1 6cm bandage at foot, 1 8cm bandage at ankle, 1 12 cm from foot to knee, 1 12 cm from lower leg to mid thigh with spiral, 1 12 cm from lower leg to mid thigh  07/16/22 Manual Therapy MLD: in supine with HOB elevated as follows: short neck, superficial and deep abdominals, Rt inguinal nodes and then R LE working proximal to distal, intact sequence, moving fluid towards inguinal nodes.  Compression bandaging to RLE in supine: Cocoa butter applied,  TG soft from foot to mid thigh, elastomull to digits 1-4, artiflex around foot/ankle, rosidal from just below knee to mid thigh, 1 6cm bandage at foot, 1 8cm bandage at ankle, 1 12 cm from foot to knee, 1 12 cm from lower leg to mid thigh with spiral, 1 12 cm from lower leg to mid thigh  07/14/22 Orthotic Fit At beginning of session measured pt for a [employer] and she fits well into the size of Large and tall for length. She would like to see if her insurance coverage assessed so will fax her demographics to one of our DME companies to verify her coverage, per pt request.  Manual Therapy MLD: in supine with HOB elevated as follows: short neck, superficial and deep abdominals, Rt inguinal nodes and then R LE working proximal to distal, intact sequence, moving fluid towards inguinal nodes.   Compression bandaging to RLE in supine: Cocoa butter applied, TG soft from foot to mid thigh, elastomull to digits 1-4, artiflex around foot/ankle, rosidal from just below knee to mid thigh, 1 6cm bandage at foot, 1 8cm bandage at ankle, 1 12 cm from foot to knee, 1 12 cm  from lower leg to mid thigh with spiral, 1 12 cm from lower leg to mid thigh      PATIENT EDUCATION:  Education details: causes of edema, how a DVT can lead to prolonged edema in combination with recent surgery, need for bandaging and then eventually compression garments Person educated: Patient Education method: Explanation Education comprehension: verbalized understanding  HOME EXERCISE PROGRAM: Wear compression bandaging 24/7 and remove if pain or numbness that does not go away when you move  ASSESSMENT:  CLINICAL IMPRESSION: Pt forgot her bandages today so was not able to bandage her Rt LE. Her lower leg circumference measurements were slightly elevated from last time measured but not as high as initial. Due to this encouraged pt to wear her TG soft when she gets home and to self bandage her Rt LE as she reports she would feel comfortable attempting that. Her new thigh high compression garment has been ordered and should arrive by early next week due to Thanksgiving holiday.   OBJECTIVE IMPAIRMENTS: decreased knowledge of condition, decreased knowledge of use of DME, increased edema, and pain.   ACTIVITY LIMITATIONS: standing and standing, walking  PARTICIPATION LIMITATIONS: occupation  PERSONAL FACTORS:  none  are also affecting patient's functional outcome.   REHAB POTENTIAL: Excellent  CLINICAL DECISION MAKING: Stable/uncomplicated  EVALUATION COMPLEXITY: Low  GOALS: Goals reviewed with patient? Yes  SHORT TERM GOALS=LONG TERM GOALS Target date: 08/27/2022    Pt will demonstrate a 4 cm decrease in edema 10 cm proximal to lateral malleoli to improve comfort.  Baseline: 26.5cm  Goal status:  INITIAL  2.  Pt will obtain appropriate compression garments for long term management of edema.  Baseline:  Goal status: INITIAL  3.  Pt will be independent in self MLD for long term management of edema.  Baseline:  Goal status: INITIAL  4.  Pt will report a 75% improvement in discomfort and pain in RLE to allow improved ability to walk and stand.  Baseline:  Goal status: INITIAL  PLAN:  PT FREQUENCY: 3x/week  PT DURATION: 4 weeks  PLANNED INTERVENTIONS: Therapeutic exercises, Therapeutic activity, Patient/Family education, Self Care, Orthotic/Fit training, Manual lymph drainage, Compression bandaging, Taping, Vasopneumatic device, and Manual therapy  PLAN FOR NEXT SESSION:  Cont CDT to Rt LE until garments arrive.    Otelia Limes, PTA 07/30/2022, 10:05 AM

## 2022-08-04 ENCOUNTER — Encounter: Payer: Self-pay | Admitting: Physical Therapy

## 2022-08-04 ENCOUNTER — Ambulatory Visit: Payer: Commercial Managed Care - HMO | Admitting: Physical Therapy

## 2022-08-04 DIAGNOSIS — R600 Localized edema: Secondary | ICD-10-CM

## 2022-08-04 DIAGNOSIS — Z96651 Presence of right artificial knee joint: Secondary | ICD-10-CM

## 2022-08-04 DIAGNOSIS — R262 Difficulty in walking, not elsewhere classified: Secondary | ICD-10-CM

## 2022-08-04 NOTE — Therapy (Signed)
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,  multifocal IDC, DCIS, ER/PR+,  01-09-2016 s/p bilteral mastectomies w/ left sln bx;  no chemoradiation   Cataracts, bilateral    Depression    Diabetes mellitus without complication ([place])    type 2   GAD (generalized anxiety disorder)    Gallbladder problem    History of ovarian cyst    IBS (irritable bowel syndrome)    Joint pain    PONV (postoperative nausea and vomiting)    does well with scop patch   Retinal detachment    Rheg OS   Right knee meniscal tear    Urgency of urination    Past Surgical History:  Procedure Laterality Date   ABDOMINAL HYSTERECTOMY  05/1996   endometriosis   ACHILLES TENDON REPAIR Right 2010;  revision 2011   BLADDER SUSPENSION  2000   BREAST BIOPSY Left 10/2015   BREAST IMPLANT REMOVAL Bilateral 08/12/2017   Procedure: REMOVAL BILATERAL BREAST IMPLANTS;  Surgeon: Wallace Going, DO;  Location: [place];  Service: Plastics;  Laterality: Bilateral;   BREAST RECONSTRUCTION WITH PLACEMENT OF TISSUE EXPANDER AND FLEX HD (ACELLULAR HYDRATED DERMIS) Bilateral 01/09/2016   BREAST RECONSTRUCTION WITH PLACEMENT OF TISSUE EXPANDER AND FLEX HD (ACELLULAR HYDRATED DERMIS) Bilateral 01/09/2016   Procedure: BREAST RECONSTRUCTION WITH PLACEMENT OF TISSUE EXPANDER AND FLEX HD (ACELLULAR HYDRATED  DERMIS);  Surgeon: Loel Lofty Dillingham, DO;  Location: [place];  Service: Plastics;  Laterality: Bilateral;   BREAST RECONSTRUCTION WITH PLACEMENT OF TISSUE EXPANDER AND FLEX HD (ACELLULAR HYDRATED DERMIS) Bilateral 05/29/2016   Procedure: PLACEMENT OF BILATERAL TISSUE EXPANDER AND FLEX HD (ACELLULAR HYDRATED DERMIS);  Surgeon: Wallace Going, DO;  Location: [place];  Service: Plastics;  Laterality: Bilateral;   BREAST REDUCTION SURGERY Bilateral 11/26/2016   Procedure: BILATERAL BREAST CAPSULE CONTRACTURE RELASE;  Surgeon: Wallace Going, DO;  Location: [place];  Service: Plastics;  Laterality: Bilateral;   [place]; 1989; [place] Right x3   last one 02-01-2019 @ [facility]   EYE SURGERY Left 10/01/2020   Pneumatic retinopexy for repair of rheg RD - Dr. Bernarda Caffey   EYE SURGERY Left 10/04/2020   PPV - Dr. Bernarda Caffey   FAT GRAFTING BILATERAL BREAST  08-09-2018  _0    GAS INSERTION Left 10/04/2020   Procedure: INSERTION OF GAS;  Surgeon: Bernarda Caffey, MD;  Location: [place];  Service: Ophthalmology;  Laterality: Left;   GAS/FLUID EXCHANGE Left 10/04/2020   Procedure: GAS/FLUID EXCHANGE;  Surgeon: Bernarda Caffey, MD;  Location: [place];  Service: Ophthalmology;  Laterality: Left;   INCISION AND DRAINAGE OF WOUND Bilateral 02/11/2016   Procedure: IRRIGATION  AND DEBRIDEMENT OF BILATERAL BREAST POCKET;  Surgeon: Wallace Going, DO;  Location: [place];  Service: Plastics;  Laterality: Bilateral;   KNEE ARTHROSCOPY WITH MEDIAL MENISECTOMY Right 12/20/2019   Procedure: KNEE ARTHROSCOPY WITH MEDIAL MENISECTOMY;  Surgeon: Marchia Bond, MD;  Location: [place];  Service: Orthopedics;  Laterality: Right;   KNEE ARTHROSCOPY WITH MEDIAL MENISECTOMY Right 10/30/2021   Procedure: RIGHT KNEE ARTHROSCOPY WITH PARTIAL MEDIAL MENISECTOMY SYNOVECTOMY;  Surgeon: Leandrew Koyanagi, MD;  Location: [place];  Service: Orthopedics;  Laterality: Right;   LAPAROSCOPIC APPENDECTOMY  04-07-2011   _0    w/ Excision peritoneal lipoma and lysis adhesions   LAPAROSCOPIC CHOLECYSTECTOMY  ~ [place] Right 10/04/2020   Procedure: LASER RETINOPEXY WITH INDIRECT LASER OPTHALMOSCOPE, RIGHT EYE;  Surgeon: Bernarda Caffey, MD;  Location: [place];  Service: Ophthalmology;  Laterality: Right;   LIPOSUCTION WITH LIPOFILLING Bilateral 11/26/2016   Procedure: LIPOFILLING FOR SYMMETRY;  Surgeon: Wallace Going, DO;  Location: [place];  Service: Plastics;  Laterality: Bilateral;   LIPOSUCTION WITH LIPOFILLING Bilateral 01/21/2017   Procedure: BILATERAL BREAST  LIPOFILLING FOR ASYMMETRY;  Surgeon: Wallace Going, DO;  Location: [place];  Service: Plastics;  Laterality: Bilateral;   LIPOSUCTION WITH LIPOFILLING Bilateral 06/28/2020   Procedure: Lipofilling bilateral breasts for asymmetry;  Surgeon: Wallace Going, DO;  Location: [place];  Service: Plastics;  Laterality: Bilateral;  90 min   MASTECTOMY Bilateral 01/09/2016   NIPPLE SPARING MASTECTOMY/SENTINAL LYMPH NODE BIOPSY/RECONSTRUCTION/PLACEMENT OF TISSUE EXPANDER Bilateral 01/09/2016   Procedure: BILATERAL NIPPLE SPARING MASTECTOMY WITH LEFT SENTINAL LYMPH NODE BIOPSY ;  Surgeon: Stark Klein, MD;  Location: [place];  Service: General;  Laterality: Bilateral;   PHOTOCOAGULATION WITH LASER Left 10/04/2020   Procedure: PHOTOCOAGULATION WITH LASER;  Surgeon: Bernarda Caffey, MD;  Location: [place];  Service: Ophthalmology;  Laterality: Left;   REMOVAL OF BILATERAL TISSUE EXPANDERS WITH PLACEMENT OF BILATERAL BREAST IMPLANTS Bilateral 08/20/2016   Procedure: REMOVAL OF BILATERAL TISSUE EXPANDERS WITH PLACEMENT OF BILATERAL SILICONE IMPLANTS;  Surgeon: Wallace Going, DO;  Location: [place];  Service: Plastics;  Laterality: Bilateral;   REMOVAL OF BILATERAL TISSUE EXPANDERS  WITH PLACEMENT OF BILATERAL BREAST IMPLANTS Bilateral 11/05/2017   Procedure: REMOVAL OF BILATERAL TISSUE EXPANDERS WITH PLACEMENT OF BILATERAL BREAST SILICONE IMPLANTS;  Surgeon: Wallace Going, DO;  Location: [place];  Service: Plastics;  Laterality: Bilateral;   REMOVAL OF TISSUE EXPANDER Bilateral 02/11/2016   Procedure: REMOVAL OF BILATERAL TISSUE EXPANDERS AND FLEX HD REMOVAL;  Surgeon: Wallace Going, DO;  Location: [place];  Service: Plastics;  Laterality: Bilateral;   RETINAL DETACHMENT SURGERY Left 10/01/2020   Pneumatic retinopexy for repair of rheg RD - Dr. Bernarda Caffey   RETINAL DETACHMENT SURGERY Left 10/04/2020   PPV - Dr. Bernarda Caffey   SCLERAL BUCKLE Left 10/04/2020   Procedure: SCLERAL BUCKLE LEFT EYE;  Surgeon: Bernarda Caffey, MD;  Location: [place];  Service: Ophthalmology;  Laterality: Left;   TISSUE EXPANDER PLACEMENT Bilateral 08/12/2017   Procedure: PLACEMENT OF BILATERAL TISSUE EXPANDER;  Surgeon: Wallace Going, DO;  Location: [place];  Service: Plastics;  Laterality: Bilateral;   TOTAL KNEE ARTHROPLASTY Right 03/31/2022   Procedure: RIGHT TOTAL KNEE REPLACEMENT;  Surgeon: Leandrew Koyanagi, MD;  Location: [place];  Service: Orthopedics;  Laterality: Right;   TUBAL LIGATION Bilateral 1991   VITRECTOMY 25  GAUGE WITH SCLERAL BUCKLE Left 10/04/2020   Procedure: 41 GAUGE PARS PLANA VITRECTOMY LEFT EYE ;  Surgeon: Bernarda Caffey, MD;  Location: [place];  Service: Ophthalmology;  Laterality: Left;   Patient Active Problem List   Diagnosis Date Noted   DVT (deep venous thrombosis) ([place]) 05/11/2022   Acute pulmonary embolism ([place]) 05/09/2022   Chest pain 05/09/2022   Elevated troponin 05/09/2022   Status post total right knee replacement 03/31/2022   Primary osteoarthritis of right knee 03/20/2022   Eating disorder 03/20/2022   Acute medial meniscus tear, right, initial encounter 10/01/2021   At risk for dehydration  01/28/2021   At risk for malnutrition 01/01/2021   Obesity, Class I, BMI 30-34.9 12/31/2020   Vitamin B12 deficiency 10/08/2020   At risk for depression 10/08/2020   Mood disorder ([place]), with emotional eating 09/24/2020   At risk for impaired metabolic function 38/88/2800   At risk for diabetes mellitus 07/30/2020   Vitamin D deficiency 07/02/2020   Depression 07/02/2020   At risk for side effect of medication 07/02/2020   Stress due to illness of family member-  daughter with drug addiction 05/15/2020   Depression, recurrent ([place]) 05/15/2020   Anemia 05/15/2020   Constipation 05/15/2020   Prediabetes 05/15/2020   Breast asymmetry following reconstructive surgery 04/24/2020   Acquired absence of breast 04/24/2020   S/P breast reconstruction, bilateral 04/24/2020   Acute medial meniscus tear of left knee 12/20/2019   Breast cancer, left ([place]) 01/09/2016   Genetic testing 34/91/7915   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   Family history of breast cancer in female 11/08/2015   Family history of colon cancer 11/08/2015   Malignant neoplasm of central portion of left breast in female, estrogen receptor positive ([place]) 10/31/2015   Endometriosis 03/11/2011    PCP: Maurice Small, MD  REFERRING PROVIDER: Nicholas Lose, MD   REFERRING DIAG: M79.89 (ICD-10-CM) - Right leg swelling   THERAPY DIAG:  Localized edema  Difficulty in walking, not elsewhere classified  History of total right knee replacement  ONSET DATE: 03/31/22  Rationale for Evaluation and Treatment Rehabilitation  SUBJECTIVE:                                                                                                                                                                                           SUBJECTIVE STATEMENT: I wore the TG soft some over the weekend. I am supposed to get the stockings any day.   PERTINENT HISTORY:  Right lower extremity swelling/ lymphedema with known right lower  extremity DVT 05/12/22 and hx of right total knee 03/31/22, L breast cancer in 2017 with  prophylactic bilateral mastectomy PAIN:  Are you having pain? Yes NPRS scale: 4/10 Pain location: knee Pain orientation: Right  PAIN TYPE: aching Pain description: constant  Aggravating factors: maybe the weather is making hurt more consistently, standing up and first few steps always hurts more Relieving factors: tylenol, bandages   PRECAUTIONS: Other: R TKA 03/31/22  WEIGHT BEARING RESTRICTIONS: No  FALLS:  Has patient fallen in last 6 months? 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  LEISURE: pt does not exercise  HAND DOMINANCE: right   PRIOR LEVEL OF FUNCTION: Independent  PATIENT GOALS: to walk without a cane, to walk where it does not hurt as much   OBJECTIVE:  COGNITION: Overall cognitive status: Within functional limits for tasks assessed   PALPATION: Fibrosis throughout R LE, pitting edema  OBSERVATIONS / OTHER ASSESSMENTS: RLE from knee down approx twice the size of left    LOWER EXTREMITY LANDMARK RIGHT eval 07/04/22 07/07/22 07/11/22 07/18/22 07/30/22  At groin        30 cm proximal to suprapatella        20 cm proximal to suprapatella 55 55 55 55.4 57.2 56.4  10 cm proximal to suprapatella 48 47 47.5 46.6 48.2 48.4  At midpatella / popliteal crease 43.9 44 43 42 42.6 42.6  30 cm proximal to floor at lateral plantar foot 39.8 37.5 39.6 36.5 34.9 37.5  20 cm proximal to floor at lateral plantar foot 31.9 31 30.7 28.6 26.9 29.2  10 cm proximal to floor at lateral plantar foot 26.5 24.7 25 22.6 23.1 23.9  Circumference of ankle/heel        5 cm proximal to 1st MTP joint 22._0 21.9 22.5 23.4  Across  MTP joint 23.1 23 22.2 22 22.3 22.4  Around proximal great toe 8.6 8.6 8.4 8.4 8.3 8.8  (Blank rows = not tested)  LOWER EXTREMITY LANDMARK LEFT eval  At groin   30 cm proximal to suprapatella   20 cm proximal to suprapatella 56.2  10 cm proximal to suprapatella 47.5  At midpatella / popliteal crease 40.5  30 cm proximal to floor at lateral plantar foot 36.9  20 cm proximal to floor at lateral plantar foot 26.8  10 cm proximal to floor at lateral plantar foot 21.9  Circumference of ankle/heel   5 cm proximal to 1st MTP joint 22.1  Across MTP joint 22.2  Around proximal great toe 8.3  (Blank rows = not tested)  TODAY'S TREATMENT:                                                                                                                           DATE:  08/04/22: Manual Therapy MLD: in supine with  HOB elevated as follows: short neck, superficial and deep abdominals, Rt inguinal nodes and then R LE working proximal to distal, intact sequence, moving fluid towards inguinal nodes. Educated pt to bring her bandages in next session so we can wrap her to reduce leg prior to her compression garments arriving.   07/30/22: Manual Therapy MLD: in supine with HOB elevated as follows: short neck, superficial and deep abdominals, Rt inguinal nodes and then R LE working proximal to distal, intact sequence, moving fluid towards inguinal nodes.  07/28/22: MLD: in supine with HOB elevated as follows: short neck, superficial and deep abdominals, Rt inguinal nodes and then R LE working proximal to distal, intact sequence, moving fluid towards inguinal nodes.  Compression bandaging to RLE in sitting: Cocoa butter applied, NEW TG soft from foot to mid thigh, elastomull to digits 1-4, artiflex around foot/ankle, rosidal from just below knee to mid thigh, 1 6cm bandage at foot, 1 8cm bandage at ankle, 1 12 cm from foot to knee, 1 12 cm from lower leg to mid thigh with spiral, 1 12 cm from lower leg to mid  thigh  07/25/2022 MLD: in supine with HOB elevated as follows: short neck, No abdominals due to earlier stomach virus, Rt inguinal nodes and then R LE working proximal to distal, intact sequence, moving fluid towards inguinal nodes.  Compression bandaging to RLE in sitting: Cocoa butter applied, NEW TG soft from foot to mid thigh, elastomull to digits 1-5, artiflex around foot/ankle, rosidal from just below knee to mid thigh, 1 6cm bandage at foot, 1 8cm bandage at ankle, 1 12 cm from foot to knee, 1 12 cm from lower leg to mid thigh with spiral, 1 12 cm from lower leg to mid thigh   07/18/22 Manual Therapy MLD: in supine with HOB elevated as follows: short neck, superficial and deep abdominals, Rt inguinal nodes and then R LE working proximal to distal, intact sequence, moving fluid towards inguinal nodes.  Compression bandaging to RLE in supine: Cocoa butter applied, TG soft from foot to mid thigh, elastomull to digits 1-4, artiflex around foot/ankle, rosidal from just below knee to mid thigh, 1 6cm bandage at foot, 1 8cm bandage at ankle, 1 12 cm from foot to knee, 1 12 cm from lower leg to mid thigh with spiral, 1 12 cm from lower leg to mid thigh  07/16/22 Manual Therapy MLD: in supine with HOB elevated as follows: short neck, superficial and deep abdominals, Rt inguinal nodes and then R LE working proximal to distal, intact sequence, moving fluid towards inguinal nodes.  Compression bandaging to RLE in supine: Cocoa butter applied,  TG soft from foot to mid thigh, elastomull to digits 1-4, artiflex around foot/ankle, rosidal from just below knee to mid thigh, 1 6cm bandage at foot, 1 8cm bandage at ankle, 1 12 cm from foot to knee, 1 12 cm from lower leg to mid thigh with spiral, 1 12 cm from lower leg to mid thigh  07/14/22 Orthotic Fit At beginning of session measured pt for a [employer] and she fits well into the size of Large and tall for length. She would like to see if her  insurance coverage assessed so will fax her demographics to one of our DME companies to verify her coverage, per pt request.  Manual Therapy MLD: in supine with HOB elevated as follows: short neck, superficial and deep abdominals, Rt inguinal nodes and then R LE working proximal to distal, intact sequence, moving  fluid towards inguinal nodes.  Compression bandaging to RLE in supine: Cocoa butter applied, TG soft from foot to mid thigh, elastomull to digits 1-4, artiflex around foot/ankle, rosidal from just below knee to mid thigh, 1 6cm bandage at foot, 1 8cm bandage at ankle, 1 12 cm from foot to knee, 1 12 cm from lower leg to mid thigh with spiral, 1 12 cm from lower leg to mid thigh      PATIENT EDUCATION:  Education details: causes of edema, how a DVT can lead to prolonged edema in combination with recent surgery, need for bandaging and then eventually compression garments Person educated: Patient Education method: Explanation Education comprehension: verbalized understanding  HOME EXERCISE PROGRAM: Wear compression bandaging 24/7 and remove if pain or numbness that does not go away when you move  ASSESSMENT:  CLINICAL IMPRESSION: Pt has visible edema especially at her calf and ankle today. She did not have her bandages with her. She expects to receive her compression garments any day. Educated pt to bring her bandages to her next appointment so she can be wrapped in order to reduce prior to wearing her garment once it arrives.   OBJECTIVE IMPAIRMENTS: decreased knowledge of condition, decreased knowledge of use of DME, increased edema, and pain.   ACTIVITY LIMITATIONS: standing and standing, walking  PARTICIPATION LIMITATIONS: occupation  PERSONAL FACTORS:  none  are also affecting patient's functional outcome.   REHAB POTENTIAL: Excellent  CLINICAL DECISION MAKING: Stable/uncomplicated  EVALUATION COMPLEXITY: Low  GOALS: Goals reviewed with patient? Yes  SHORT TERM  GOALS=LONG TERM GOALS Target date: 09/01/2022    Pt will demonstrate a 4 cm decrease in edema 10 cm proximal to lateral malleoli to improve comfort.  Baseline: 26.5cm  Goal status: INITIAL  2.  Pt will obtain appropriate compression garments for long term management of edema.  Baseline:  Goal status: INITIAL  3.  Pt will be independent in self MLD for long term management of edema.  Baseline:  Goal status: INITIAL  4.  Pt will report a 75% improvement in discomfort and pain in RLE to allow improved ability to walk and stand.  Baseline:  Goal status: INITIAL  PLAN:  PT FREQUENCY: 3x/week  PT DURATION: 4 weeks  PLANNED INTERVENTIONS: Therapeutic exercises, Therapeutic activity, Patient/Family education, Self Care, Orthotic/Fit training, Manual lymph drainage, Compression bandaging, Taping, Vasopneumatic device, and Manual therapy  PLAN FOR NEXT SESSION:  Cont CDT to Rt LE until garments arrive.    Allyson Sabal [place], PT 08/04/2022, 3:58 PM

## 2022-08-06 ENCOUNTER — Ambulatory Visit: Payer: Commercial Managed Care - HMO | Admitting: Physical Therapy

## 2022-08-06 ENCOUNTER — Encounter: Payer: Self-pay | Admitting: Physical Therapy

## 2022-08-06 DIAGNOSIS — R600 Localized edema: Secondary | ICD-10-CM

## 2022-08-06 DIAGNOSIS — R262 Difficulty in walking, not elsewhere classified: Secondary | ICD-10-CM

## 2022-08-06 NOTE — Therapy (Signed)
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, left oncologist--- dr gudena   dx 02/ 2017,  multifocal IDC, DCIS, ER/PR+,  01-09-2016 s/p bilteral mastectomies w/ left sln bx;  no chemoradiation   Cataracts, bilateral    Depression    Diabetes mellitus without complication (HCC)    type 2   GAD (generalized anxiety disorder)    Gallbladder problem    History of ovarian cyst    IBS (irritable bowel syndrome)    Joint pain    PONV (postoperative nausea and vomiting)    does well with scop patch   Retinal detachment    Rheg OS   Right knee meniscal tear    Urgency of urination    Past Surgical History:  Procedure Laterality Date   ABDOMINAL HYSTERECTOMY  05/1996   endometriosis   ACHILLES TENDON REPAIR Right 2010;  revision 2011   BLADDER SUSPENSION  2000   BREAST BIOPSY Left 10/2015   BREAST IMPLANT REMOVAL Bilateral 08/12/2017   Procedure: REMOVAL BILATERAL BREAST IMPLANTS;  Surgeon: Dillingham, Claire S, DO;  Location: [facility];  Service: Plastics;  Laterality: Bilateral;   BREAST RECONSTRUCTION WITH PLACEMENT OF TISSUE EXPANDER AND FLEX HD (ACELLULAR HYDRATED DERMIS) Bilateral 01/09/2016   BREAST RECONSTRUCTION WITH PLACEMENT OF TISSUE EXPANDER AND FLEX HD (ACELLULAR HYDRATED DERMIS) Bilateral 01/09/2016   Procedure: BREAST RECONSTRUCTION WITH PLACEMENT OF TISSUE EXPANDER AND FLEX HD (ACELLULAR HYDRATED  DERMIS);  Surgeon: Claire S Dillingham, DO;  [facility];  Service: Plastics;  Laterality: Bilateral;   BREAST RECONSTRUCTION WITH PLACEMENT OF TISSUE EXPANDER AND FLEX HD (ACELLULAR HYDRATED DERMIS) Bilateral 05/29/2016   Procedure: PLACEMENT OF BILATERAL TISSUE EXPANDER AND FLEX HD (ACELLULAR HYDRATED DERMIS);  Surgeon: Claire S Dillingham, DO;  Location: [facility];  Service: Plastics;  Laterality: Bilateral;   BREAST REDUCTION SURGERY Bilateral 11/26/2016   Procedure: BILATERAL BREAST CAPSULE CONTRACTURE RELASE;  Surgeon: Claire S Dillingham, DO;  Location: [facility];  Service: Plastics;  Laterality: Bilateral;   CESAREAN SECTION  1987; 1989; 1991   ELBOW SURGERY Right x3   last one 02-01-2019 @ [facility]   EYE SURGERY Left 10/01/2020   Pneumatic retinopexy for repair of rheg RD - Dr. Brian Zamora   EYE SURGERY Left 10/04/2020   PPV - Dr. Brian Zamora   FAT GRAFTING BILATERAL BREAST  08-09-2018  @[facility]   GAS INSERTION Left 10/04/2020   Procedure: INSERTION OF GAS;  Surgeon: Zamora, Brian, MD;  [facility]: [facility];  Service: Ophthalmology;  Laterality: Left;   GAS/FLUID EXCHANGE Left 10/04/2020   Procedure: GAS/FLUID EXCHANGE;  Surgeon: Zamora, Brian, MD;  [facility]: [facility];  Service: Ophthalmology;  Laterality: Left;   INCISION AND DRAINAGE OF WOUND Bilateral 02/11/2016   Procedure: IRRIGATION   AND DEBRIDEMENT OF BILATERAL BREAST POCKET;  Surgeon: Wallace Going, DO;  Location: [place];  Service: Plastics;  Laterality: Bilateral;   KNEE ARTHROSCOPY WITH MEDIAL MENISECTOMY Right 12/20/2019   Procedure: KNEE ARTHROSCOPY WITH MEDIAL MENISECTOMY;  Surgeon: Marchia Bond, MD;  Location: [place];  Service: Orthopedics;  Laterality: Right;   KNEE ARTHROSCOPY WITH MEDIAL MENISECTOMY Right 10/30/2021   Procedure: RIGHT KNEE ARTHROSCOPY WITH PARTIAL MEDIAL MENISECTOMY SYNOVECTOMY;  Surgeon: Leandrew Koyanagi, MD;  Location: [place];  Service: Orthopedics;  Laterality: Right;   LAPAROSCOPIC APPENDECTOMY  04-07-2011   _0    w/ Excision peritoneal lipoma and lysis adhesions   LAPAROSCOPIC CHOLECYSTECTOMY  ~ [place] Right 10/04/2020   Procedure: LASER RETINOPEXY WITH INDIRECT LASER OPTHALMOSCOPE, RIGHT EYE;  Surgeon: Bernarda Caffey, MD;  Location: [place];  Service: Ophthalmology;  Laterality: Right;   LIPOSUCTION WITH LIPOFILLING Bilateral 11/26/2016   Procedure: LIPOFILLING FOR SYMMETRY;  Surgeon: Wallace Going, DO;  Location: [place];  Service: Plastics;  Laterality: Bilateral;   LIPOSUCTION WITH LIPOFILLING Bilateral 01/21/2017   Procedure: BILATERAL BREAST  LIPOFILLING FOR ASYMMETRY;  Surgeon: Wallace Going, DO;  Location: [place];  Service: Plastics;  Laterality: Bilateral;   LIPOSUCTION WITH LIPOFILLING Bilateral 06/28/2020   Procedure: Lipofilling bilateral breasts for asymmetry;  Surgeon: Wallace Going, DO;  Location: [place];  Service: Plastics;  Laterality: Bilateral;  90 min   MASTECTOMY Bilateral 01/09/2016   NIPPLE SPARING MASTECTOMY/SENTINAL LYMPH NODE BIOPSY/RECONSTRUCTION/PLACEMENT OF TISSUE EXPANDER Bilateral 01/09/2016   Procedure: BILATERAL NIPPLE SPARING MASTECTOMY WITH LEFT SENTINAL LYMPH NODE BIOPSY ;  Surgeon: Stark Klein, MD;  Location: [place];  Service: General;  Laterality: Bilateral;   PHOTOCOAGULATION WITH LASER Left 10/04/2020   Procedure: PHOTOCOAGULATION WITH LASER;  Surgeon: Bernarda Caffey, MD;  Location: [place];  Service: Ophthalmology;  Laterality: Left;   REMOVAL OF BILATERAL TISSUE EXPANDERS WITH PLACEMENT OF BILATERAL BREAST IMPLANTS Bilateral 08/20/2016   Procedure: REMOVAL OF BILATERAL TISSUE EXPANDERS WITH PLACEMENT OF BILATERAL SILICONE IMPLANTS;  Surgeon: Wallace Going, DO;  Location: [place];  Service: Plastics;  Laterality: Bilateral;   REMOVAL OF BILATERAL TISSUE EXPANDERS  WITH PLACEMENT OF BILATERAL BREAST IMPLANTS Bilateral 11/05/2017   Procedure: REMOVAL OF BILATERAL TISSUE EXPANDERS WITH PLACEMENT OF BILATERAL BREAST SILICONE IMPLANTS;  Surgeon: Wallace Going, DO;  Location: [place];  Service: Plastics;  Laterality: Bilateral;   REMOVAL OF TISSUE EXPANDER Bilateral 02/11/2016   Procedure: REMOVAL OF BILATERAL TISSUE EXPANDERS AND FLEX HD REMOVAL;  Surgeon: Wallace Going, DO;  Location: [place];  Service: Plastics;  Laterality: Bilateral;   RETINAL DETACHMENT SURGERY Left 10/01/2020   Pneumatic retinopexy for repair of rheg RD - Dr. Bernarda Caffey   RETINAL DETACHMENT SURGERY Left 10/04/2020   PPV - Dr. Bernarda Caffey   SCLERAL BUCKLE Left 10/04/2020   Procedure: SCLERAL BUCKLE LEFT EYE;  Surgeon: Bernarda Caffey, MD;  Location: [place];  Service: Ophthalmology;  Laterality: Left;   TISSUE EXPANDER PLACEMENT Bilateral 08/12/2017   Procedure: PLACEMENT OF BILATERAL TISSUE EXPANDER;  Surgeon: Wallace Going, DO;  Location: [place];  Service: Plastics;  Laterality: Bilateral;   TOTAL KNEE ARTHROPLASTY Right 03/31/2022   Procedure: RIGHT TOTAL KNEE REPLACEMENT;  Surgeon: Leandrew Koyanagi, MD;  Location: [place];  Service: Orthopedics;  Laterality: Right;   TUBAL LIGATION Bilateral 1991   VITRECTOMY 25  GAUGE WITH SCLERAL BUCKLE Left 10/04/2020   Procedure: 19 GAUGE PARS PLANA VITRECTOMY LEFT EYE ;  Surgeon: Bernarda Caffey, MD;  Location: [place];  Service: Ophthalmology;  Laterality: Left;   Patient Active Problem List   Diagnosis Date Noted   DVT (deep venous thrombosis) ([place]) 05/11/2022   Acute pulmonary embolism ([place]) 05/09/2022   Chest pain 05/09/2022   Elevated troponin 05/09/2022   Status post total right knee replacement 03/31/2022   Primary osteoarthritis of right knee 03/20/2022   Eating disorder 03/20/2022   Acute medial meniscus tear, right, initial encounter 10/01/2021   At risk for dehydration  01/28/2021   At risk for malnutrition 01/01/2021   Obesity, Class I, BMI 30-34.9 12/31/2020   Vitamin B12 deficiency 10/08/2020   At risk for depression 10/08/2020   Mood disorder ([place]), with emotional eating 09/24/2020   At risk for impaired metabolic function 19/50/9326   At risk for diabetes mellitus 07/30/2020   Vitamin D deficiency 07/02/2020   Depression 07/02/2020   At risk for side effect of medication 07/02/2020   Stress due to illness of family member-  daughter with drug addiction 05/15/2020   Depression, recurrent ([place]) 05/15/2020   Anemia 05/15/2020   Constipation 05/15/2020   Prediabetes 05/15/2020   Breast asymmetry following reconstructive surgery 04/24/2020   Acquired absence of breast 04/24/2020   S/P breast reconstruction, bilateral 04/24/2020   Acute medial meniscus tear of left knee 12/20/2019   Breast cancer, left ([place]) 01/09/2016   Genetic testing 71/24/5809   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   Family history of breast cancer in female 11/08/2015   Family history of colon cancer 11/08/2015   Malignant neoplasm of central portion of left breast in female, estrogen receptor positive ([place]) 10/31/2015   Endometriosis 03/11/2011    PCP: Maurice Small, MD  REFERRING PROVIDER: Nicholas Lose, MD   REFERRING DIAG: M79.89 (ICD-10-CM) - Right leg swelling   THERAPY DIAG:  Localized edema  Difficulty in walking, not elsewhere classified  ONSET DATE: 03/31/22  Rationale for Evaluation and Treatment Rehabilitation  SUBJECTIVE:                                                                                                                                                                                           SUBJECTIVE STATEMENT: I think my daughter threw away my bandages by mistake. [place] Kitchen   PERTINENT HISTORY:  Right lower extremity swelling/ lymphedema with known right lower extremity DVT 05/12/22 and hx of right total knee 03/31/22, L breast cancer in  2017 with prophylactic bilateral mastectomy PAIN:  Are you having pain? Yes NPRS scale: 2/10 Pain  location: knee Pain orientation: Right  PAIN TYPE: aching Pain description: constant  Aggravating factors: maybe the weather is making hurt more consistently, standing up and first few steps always hurts more Relieving factors: tylenol, bandages   PRECAUTIONS: Other: R TKA 03/31/22  WEIGHT BEARING RESTRICTIONS: No  FALLS:  Has patient fallen in last 6 months? 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  LEISURE: pt does not exercise  HAND DOMINANCE: right   PRIOR LEVEL OF FUNCTION: Independent  PATIENT GOALS: to walk without a cane, to walk where it does not hurt as much   OBJECTIVE:  COGNITION: Overall cognitive status: Within functional limits for tasks assessed   PALPATION: Fibrosis throughout R LE, pitting edema  OBSERVATIONS / OTHER ASSESSMENTS: RLE from knee down approx twice the size of left    LOWER EXTREMITY LANDMARK RIGHT eval 07/04/22 07/07/22 07/11/22 07/18/22 07/30/22  At groin        30 cm proximal to suprapatella        20 cm proximal to suprapatella 55 55 55 55.4 57.2 56.4  10 cm proximal to suprapatella 48 47 47.5 46.6 48.2 48.4  At midpatella / popliteal crease 43.9 44 43 42 42.6 42.6  30 cm proximal to floor at lateral plantar foot 39.8 37.5 39.6 36.5 34.9 37.5  20 cm proximal to floor at lateral plantar foot 31.9 31 30.7 28.6 26.9 29.2  10 cm proximal to floor at lateral plantar foot 26.5 24.7 25 22.6 23.1 23.9  Circumference of ankle/heel        5 cm proximal to 1st MTP joint 22._0 21.9 22.5 23.4  Across MTP joint 23.1 23 22.2 22 22.3 22.4  Around proximal great toe 8.6 8.6 8.4  8.4 8.3 8.8  (Blank rows = not tested)  LOWER EXTREMITY LANDMARK LEFT eval  At groin   30 cm proximal to suprapatella   20 cm proximal to suprapatella 56.2  10 cm proximal to suprapatella 47.5  At midpatella / popliteal crease 40.5  30 cm proximal to floor at lateral plantar foot 36.9  20 cm proximal to floor at lateral plantar foot 26.8  10 cm proximal to floor at lateral plantar foot 21.9  Circumference of ankle/heel   5 cm proximal to 1st MTP joint 22.1  Across MTP joint 22.2  Around proximal great toe 8.3  (Blank rows = not tested)  TODAY'S TREATMENT:                                                                                                                           DATE:  08/06/22: Manual Therapy MLD: in supine with HOB elevated as follows: short neck, superficial and deep abdominals, Rt inguinal nodes and  then R LE working proximal to distal, intact sequence, moving fluid towards inguinal nodes. Sent email to [facility] to check the status of pt's compression garments because they have still not arrived.   08/04/22: Manual Therapy MLD: in supine with HOB elevated as follows: short neck, superficial and deep abdominals, Rt inguinal nodes and then R LE working proximal to distal, intact sequence, moving fluid towards inguinal nodes. Educated pt to bring her bandages in next session so we can wrap her to reduce leg prior to her compression garments arriving.   07/30/22: Manual Therapy MLD: in supine with HOB elevated as follows: short neck, superficial and deep abdominals, Rt inguinal nodes and then R LE working proximal to distal, intact sequence, moving fluid towards inguinal nodes.  07/28/22: MLD: in supine with HOB elevated as follows: short neck, superficial and deep abdominals, Rt inguinal nodes and then R LE working proximal to distal, intact sequence, moving fluid towards inguinal nodes.  Compression bandaging to RLE in sitting: Cocoa butter applied, NEW TG soft  from foot to mid thigh, elastomull to digits 1-4, artiflex around foot/ankle, rosidal from just below knee to mid thigh, 1 6cm bandage at foot, 1 8cm bandage at ankle, 1 12 cm from foot to knee, 1 12 cm from lower leg to mid thigh with spiral, 1 12 cm from lower leg to mid thigh  07/25/2022 MLD: in supine with HOB elevated as follows: short neck, No abdominals due to earlier stomach virus, Rt inguinal nodes and then R LE working proximal to distal, intact sequence, moving fluid towards inguinal nodes.  Compression bandaging to RLE in sitting: Cocoa butter applied, NEW TG soft from foot to mid thigh, elastomull to digits 1-5, artiflex around foot/ankle, rosidal from just below knee to mid thigh, 1 6cm bandage at foot, 1 8cm bandage at ankle, 1 12 cm from foot to knee, 1 12 cm from lower leg to mid thigh with spiral, 1 12 cm from lower leg to mid thigh   07/18/22 Manual Therapy MLD: in supine with HOB elevated as follows: short neck, superficial and deep abdominals, Rt inguinal nodes and then R LE working proximal to distal, intact sequence, moving fluid towards inguinal nodes.  Compression bandaging to RLE in supine: Cocoa butter applied, TG soft from foot to mid thigh, elastomull to digits 1-4, artiflex around foot/ankle, rosidal from just below knee to mid thigh, 1 6cm bandage at foot, 1 8cm bandage at ankle, 1 12 cm from foot to knee, 1 12 cm from lower leg to mid thigh with spiral, 1 12 cm from lower leg to mid thigh  07/16/22 Manual Therapy MLD: in supine with HOB elevated as follows: short neck, superficial and deep abdominals, Rt inguinal nodes and then R LE working proximal to distal, intact sequence, moving fluid towards inguinal nodes.  Compression bandaging to RLE in supine: Cocoa butter applied,  TG soft from foot to mid thigh, elastomull to digits 1-4, artiflex around foot/ankle, rosidal from just below knee to mid thigh, 1 6cm bandage at foot, 1 8cm bandage at ankle, 1 12 cm from foot to  knee, 1 12 cm from lower leg to mid thigh with spiral, 1 12 cm from lower leg to mid thigh  07/14/22 Orthotic Fit At beginning of session measured pt for a [employer] Exostrong OTS and she fits well into the size of Large and tall for length. She would like to see if her insurance coverage assessed so will fax her demographics to one   of our DME companies to verify her coverage, per pt request.  Manual Therapy MLD: in supine with HOB elevated as follows: short neck, superficial and deep abdominals, Rt inguinal nodes and then R LE working proximal to distal, intact sequence, moving fluid towards inguinal nodes.  Compression bandaging to RLE in supine: Cocoa butter applied, TG soft from foot to mid thigh, elastomull to digits 1-4, artiflex around foot/ankle, rosidal from just below knee to mid thigh, 1 6cm bandage at foot, 1 8cm bandage at ankle, 1 12 cm from foot to knee, 1 12 cm from lower leg to mid thigh with spiral, 1 12 cm from lower leg to mid thigh      PATIENT EDUCATION:  Education details: causes of edema, how a DVT can lead to prolonged edema in combination with recent surgery, need for bandaging and then eventually compression garments Person educated: Patient Education method: Explanation Education comprehension: verbalized understanding  HOME EXERCISE PROGRAM: Wear compression bandaging 24/7 and remove if pain or numbness that does not go away when you move  ASSESSMENT:  CLINICAL IMPRESSION: Pt still has visible edema at ankle but it is less than it was on Monday. She reports her daughter through away her bandages because they were in a bag and she thought they were trash so therapist was unable to bandage pt's leg today. Continued with MLD and sent an email to DME supplier ot check on pt's garments since they shipped last Monday and have still not arrived.   OBJECTIVE IMPAIRMENTS: decreased knowledge of condition, decreased knowledge of use of DME, increased edema, and pain.    ACTIVITY LIMITATIONS: standing and standing, walking  PARTICIPATION LIMITATIONS: occupation  PERSONAL FACTORS:  none  are also affecting patient's functional outcome.   REHAB POTENTIAL: Excellent  CLINICAL DECISION MAKING: Stable/uncomplicated  EVALUATION COMPLEXITY: Low  GOALS: Goals reviewed with patient? Yes  SHORT TERM GOALS=LONG TERM GOALS Target date: 09/03/2022    Pt will demonstrate a 4 cm decrease in edema 10 cm proximal to lateral malleoli to improve comfort.  Baseline: 26.5cm  Goal status: INITIAL  2.  Pt will obtain appropriate compression garments for long term management of edema.  Baseline:  Goal status: INITIAL  3.  Pt will be independent in self MLD for long term management of edema.  Baseline:  Goal status: INITIAL  4.  Pt will report a 75% improvement in discomfort and pain in RLE to allow improved ability to walk and stand.  Baseline:  Goal status: INITIAL  PLAN:  PT FREQUENCY: 3x/week  PT DURATION: 4 weeks  PLANNED INTERVENTIONS: Therapeutic exercises, Therapeutic activity, Patient/Family education, Self Care, Orthotic/Fit training, Manual lymph drainage, Compression bandaging, Taping, Vasopneumatic device, and Manual therapy  PLAN FOR NEXT SESSION:  Cont CDT to Rt LE until garments arrive.    [facility] Miller, PT 08/06/2022, 9:53 AM

## 2022-08-08 ENCOUNTER — Encounter: Payer: Self-pay | Admitting: Rehabilitation

## 2022-08-08 ENCOUNTER — Ambulatory Visit: Payer: Commercial Managed Care - HMO | Attending: Hematology and Oncology | Admitting: Rehabilitation

## 2022-08-08 DIAGNOSIS — R262 Difficulty in walking, not elsewhere classified: Secondary | ICD-10-CM | POA: Insufficient documentation

## 2022-08-08 DIAGNOSIS — M6281 Muscle weakness (generalized): Secondary | ICD-10-CM | POA: Diagnosis present

## 2022-08-08 DIAGNOSIS — M25661 Stiffness of right knee, not elsewhere classified: Secondary | ICD-10-CM | POA: Diagnosis present

## 2022-08-08 DIAGNOSIS — M25561 Pain in right knee: Secondary | ICD-10-CM | POA: Diagnosis present

## 2022-08-08 DIAGNOSIS — R600 Localized edema: Secondary | ICD-10-CM | POA: Insufficient documentation

## 2022-08-08 DIAGNOSIS — Z96651 Presence of right artificial knee joint: Secondary | ICD-10-CM | POA: Insufficient documentation

## 2022-08-08 DIAGNOSIS — G8929 Other chronic pain: Secondary | ICD-10-CM | POA: Insufficient documentation

## 2022-08-08 NOTE — Therapy (Signed)
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,  multifocal IDC, DCIS, ER/PR+,  01-09-2016 s/p bilteral mastectomies w/ left sln bx;  no chemoradiation   Cataracts, bilateral    Depression    Diabetes mellitus without complication ([place])    type 2   GAD (generalized anxiety disorder)    Gallbladder problem    History of ovarian cyst    IBS (irritable bowel syndrome)    Joint pain    PONV (postoperative nausea and vomiting)    does well with scop patch   Retinal detachment    Rheg OS   Right knee meniscal tear    Urgency of urination    Past Surgical History:  Procedure Laterality Date   ABDOMINAL HYSTERECTOMY  05/1996   endometriosis   ACHILLES TENDON REPAIR Right 2010;  revision 2011   BLADDER SUSPENSION  2000   BREAST BIOPSY Left 10/2015   BREAST IMPLANT REMOVAL Bilateral 08/12/2017   Procedure: REMOVAL BILATERAL BREAST IMPLANTS;  Surgeon: Wallace Going, DO;  Location: [place];  Service: Plastics;  Laterality: Bilateral;   BREAST RECONSTRUCTION WITH PLACEMENT OF TISSUE EXPANDER AND FLEX HD (ACELLULAR HYDRATED DERMIS) Bilateral 01/09/2016   BREAST RECONSTRUCTION WITH PLACEMENT OF TISSUE EXPANDER AND FLEX HD (ACELLULAR HYDRATED DERMIS) Bilateral 01/09/2016   Procedure: BREAST RECONSTRUCTION WITH PLACEMENT OF TISSUE EXPANDER AND FLEX HD (ACELLULAR HYDRATED  DERMIS);  Surgeon: Loel Lofty Dillingham, DO;  Location: [place];  Service: Plastics;  Laterality: Bilateral;   BREAST RECONSTRUCTION WITH PLACEMENT OF TISSUE EXPANDER AND FLEX HD (ACELLULAR HYDRATED DERMIS) Bilateral 05/29/2016   Procedure: PLACEMENT OF BILATERAL TISSUE EXPANDER AND FLEX HD (ACELLULAR HYDRATED DERMIS);  Surgeon: Wallace Going, DO;  Location: [place];  Service: Plastics;  Laterality: Bilateral;   BREAST REDUCTION SURGERY Bilateral 11/26/2016   Procedure: BILATERAL BREAST CAPSULE CONTRACTURE RELASE;  Surgeon: Wallace Going, DO;  Location: [place];  Service: Plastics;  Laterality: Bilateral;   [place]; 1989; [place] Right x3   last one 02-01-2019 @ [facility]   EYE SURGERY Left 10/01/2020   Pneumatic retinopexy for repair of rheg RD - Dr. Bernarda Caffey   EYE SURGERY Left 10/04/2020   PPV - Dr. Bernarda Caffey   FAT GRAFTING BILATERAL BREAST  08-09-2018  _0    GAS INSERTION Left 10/04/2020   Procedure: INSERTION OF GAS;  Surgeon: Bernarda Caffey, MD;  Location: [place];  Service: Ophthalmology;  Laterality: Left;   GAS/FLUID EXCHANGE Left 10/04/2020   Procedure: GAS/FLUID EXCHANGE;  Surgeon: Bernarda Caffey, MD;  Location: [place];  Service: Ophthalmology;  Laterality: Left;   INCISION AND DRAINAGE OF WOUND Bilateral 02/11/2016   Procedure: IRRIGATION  AND DEBRIDEMENT OF BILATERAL BREAST POCKET;  Surgeon: Wallace Going, DO;  Location: [place];  Service: Plastics;  Laterality: Bilateral;   KNEE ARTHROSCOPY WITH MEDIAL MENISECTOMY Right 12/20/2019   Procedure: KNEE ARTHROSCOPY WITH MEDIAL MENISECTOMY;  Surgeon: Marchia Bond, MD;  Location: [place];  Service: Orthopedics;  Laterality: Right;   KNEE ARTHROSCOPY WITH MEDIAL MENISECTOMY Right 10/30/2021   Procedure: RIGHT KNEE ARTHROSCOPY WITH PARTIAL MEDIAL MENISECTOMY SYNOVECTOMY;  Surgeon: Leandrew Koyanagi, MD;  Location: [place];  Service: Orthopedics;  Laterality: Right;   LAPAROSCOPIC APPENDECTOMY  04-07-2011   _0    w/ Excision peritoneal lipoma and lysis adhesions   LAPAROSCOPIC CHOLECYSTECTOMY  ~ [place] Right 10/04/2020   Procedure: LASER RETINOPEXY WITH INDIRECT LASER OPTHALMOSCOPE, RIGHT EYE;  Surgeon: Bernarda Caffey, MD;  Location: [place];  Service: Ophthalmology;  Laterality: Right;   LIPOSUCTION WITH LIPOFILLING Bilateral 11/26/2016   Procedure: LIPOFILLING FOR SYMMETRY;  Surgeon: Wallace Going, DO;  Location: [place];  Service: Plastics;  Laterality: Bilateral;   LIPOSUCTION WITH LIPOFILLING Bilateral 01/21/2017   Procedure: BILATERAL BREAST  LIPOFILLING FOR ASYMMETRY;  Surgeon: Wallace Going, DO;  Location: [place];  Service: Plastics;  Laterality: Bilateral;   LIPOSUCTION WITH LIPOFILLING Bilateral 06/28/2020   Procedure: Lipofilling bilateral breasts for asymmetry;  Surgeon: Wallace Going, DO;  Location: [place];  Service: Plastics;  Laterality: Bilateral;  90 min   MASTECTOMY Bilateral 01/09/2016   NIPPLE SPARING MASTECTOMY/SENTINAL LYMPH NODE BIOPSY/RECONSTRUCTION/PLACEMENT OF TISSUE EXPANDER Bilateral 01/09/2016   Procedure: BILATERAL NIPPLE SPARING MASTECTOMY WITH LEFT SENTINAL LYMPH NODE BIOPSY ;  Surgeon: Stark Klein, MD;  Location: [place];  Service: General;  Laterality: Bilateral;   PHOTOCOAGULATION WITH LASER Left 10/04/2020   Procedure: PHOTOCOAGULATION WITH LASER;  Surgeon: Bernarda Caffey, MD;  Location: [place];  Service: Ophthalmology;  Laterality: Left;   REMOVAL OF BILATERAL TISSUE EXPANDERS WITH PLACEMENT OF BILATERAL BREAST IMPLANTS Bilateral 08/20/2016   Procedure: REMOVAL OF BILATERAL TISSUE EXPANDERS WITH PLACEMENT OF BILATERAL SILICONE IMPLANTS;  Surgeon: Wallace Going, DO;  Location: [place];  Service: Plastics;  Laterality: Bilateral;   REMOVAL OF BILATERAL TISSUE EXPANDERS  WITH PLACEMENT OF BILATERAL BREAST IMPLANTS Bilateral 11/05/2017   Procedure: REMOVAL OF BILATERAL TISSUE EXPANDERS WITH PLACEMENT OF BILATERAL BREAST SILICONE IMPLANTS;  Surgeon: Wallace Going, DO;  Location: [place];  Service: Plastics;  Laterality: Bilateral;   REMOVAL OF TISSUE EXPANDER Bilateral 02/11/2016   Procedure: REMOVAL OF BILATERAL TISSUE EXPANDERS AND FLEX HD REMOVAL;  Surgeon: Wallace Going, DO;  Location: [place];  Service: Plastics;  Laterality: Bilateral;   RETINAL DETACHMENT SURGERY Left 10/01/2020   Pneumatic retinopexy for repair of rheg RD - Dr. Bernarda Caffey   RETINAL DETACHMENT SURGERY Left 10/04/2020   PPV - Dr. Bernarda Caffey   SCLERAL BUCKLE Left 10/04/2020   Procedure: SCLERAL BUCKLE LEFT EYE;  Surgeon: Bernarda Caffey, MD;  Location: [place];  Service: Ophthalmology;  Laterality: Left;   TISSUE EXPANDER PLACEMENT Bilateral 08/12/2017   Procedure: PLACEMENT OF BILATERAL TISSUE EXPANDER;  Surgeon: Wallace Going, DO;  Location: [place];  Service: Plastics;  Laterality: Bilateral;   TOTAL KNEE ARTHROPLASTY Right 03/31/2022   Procedure: RIGHT TOTAL KNEE REPLACEMENT;  Surgeon: Leandrew Koyanagi, MD;  Location: [place];  Service: Orthopedics;  Laterality: Right;   TUBAL LIGATION Bilateral 1991   VITRECTOMY 25  GAUGE WITH SCLERAL BUCKLE Left 10/04/2020   Procedure: 79 GAUGE PARS PLANA VITRECTOMY LEFT EYE ;  Surgeon: Bernarda Caffey, MD;  Location: [place];  Service: Ophthalmology;  Laterality: Left;   Patient Active Problem List   Diagnosis Date Noted   DVT (deep venous thrombosis) ([place]) 05/11/2022   Acute pulmonary embolism ([place]) 05/09/2022   Chest pain 05/09/2022   Elevated troponin 05/09/2022   Status post total right knee replacement 03/31/2022   Primary osteoarthritis of right knee 03/20/2022   Eating disorder 03/20/2022   Acute medial meniscus tear, right, initial encounter 10/01/2021   At risk for dehydration  01/28/2021   At risk for malnutrition 01/01/2021   Obesity, Class I, BMI 30-34.9 12/31/2020   Vitamin B12 deficiency 10/08/2020   At risk for depression 10/08/2020   Mood disorder ([place]), with emotional eating 09/24/2020   At risk for impaired metabolic function 59/56/3875   At risk for diabetes mellitus 07/30/2020   Vitamin D deficiency 07/02/2020   Depression 07/02/2020   At risk for side effect of medication 07/02/2020   Stress due to illness of family member-  daughter with drug addiction 05/15/2020   Depression, recurrent ([place]) 05/15/2020   Anemia 05/15/2020   Constipation 05/15/2020   Prediabetes 05/15/2020   Breast asymmetry following reconstructive surgery 04/24/2020   Acquired absence of breast 04/24/2020   S/P breast reconstruction, bilateral 04/24/2020   Acute medial meniscus tear of left knee 12/20/2019   Breast cancer, left ([place]) 01/09/2016   Genetic testing 64/33/2951   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   Family history of breast cancer in female 11/08/2015   Family history of colon cancer 11/08/2015   Malignant neoplasm of central portion of left breast in female, estrogen receptor positive ([place]) 10/31/2015   Endometriosis 03/11/2011    PCP: Maurice Small, MD  REFERRING PROVIDER: Nicholas Lose, MD   REFERRING DIAG: M79.89 (ICD-10-CM) - Right leg swelling   THERAPY DIAG:  Localized edema  Difficulty in walking, not elsewhere classified  History of total right knee replacement  ONSET DATE: 03/31/22  Rationale for Evaluation and Treatment Rehabilitation  SUBJECTIVE:                                                                                                                                                                                           SUBJECTIVE STATEMENT: The garments still didn't come  PERTINENT HISTORY:  Right lower extremity swelling/ lymphedema with known right lower extremity DVT 05/12/22 and hx of right total knee 03/31/22, L  breast cancer in 2017 with prophylactic bilateral mastectomy PAIN:  Are you having pain? Yes NPRS scale: 2/10 Pain  location: knee Pain orientation: Right  PAIN TYPE: aching Pain description: constant  Aggravating factors: maybe the weather is making hurt more consistently, standing up and first few steps always hurts more Relieving factors: tylenol, bandages   PRECAUTIONS: Other: R TKA 03/31/22  WEIGHT BEARING RESTRICTIONS: No  FALLS:  Has patient fallen in last 6 months? 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  LEISURE: pt does not exercise  HAND DOMINANCE: right   PRIOR LEVEL OF FUNCTION: Independent  PATIENT GOALS: to walk without a cane, to walk where it does not hurt as much   OBJECTIVE:  COGNITION: Overall cognitive status: Within functional limits for tasks assessed   PALPATION: Fibrosis throughout R LE, pitting edema  OBSERVATIONS / OTHER ASSESSMENTS: RLE from knee down approx twice the size of left    LOWER EXTREMITY [facility] RIGHT eval 07/04/22 07/07/22 07/11/22 07/18/22 07/30/22 08/08/22  At groin         30 cm proximal to suprapatella         20 cm proximal to suprapatella 55 55 55 55.4 57.2 56.4 55  10 cm proximal to suprapatella 48 47 47.5 46.6 48.2 48.4 47  At midpatella / popliteal crease 43.9 44 43 42 42.6 42.6 42.6  30 cm proximal to floor at lateral plantar foot 39.8 37.5 39.6 36.5 34.9 37.5 39  20 cm proximal to floor at lateral plantar foot 31.9 31 30.7 28.6 26.9 29._0 cm proximal to floor at lateral plantar foot 26.5 24.7 25 22.6 23.1 23.9 24.5  Circumference of ankle/heel         5 cm proximal to 1st MTP joint 22._1 21.9 22.5 23.4 22.8  Across MTP joint 23.1 23  22.2 22 22.3 22.4 22.4  Around proximal great toe 8.6 8.6 8.4 8.4 8.3 8.8 8.3  (Blank rows = not tested)  LOWER EXTREMITY [facility] LEFT eval  At groin   30 cm proximal to suprapatella   20 cm proximal to suprapatella 56.2  10 cm proximal to suprapatella 47.5  At midpatella / popliteal crease 40.5  30 cm proximal to floor at lateral plantar foot 36.9  20 cm proximal to floor at lateral plantar foot 26.8  10 cm proximal to floor at lateral plantar foot 21.9  Circumference of ankle/heel   5 cm proximal to 1st MTP joint 22.1  Across MTP joint 22.2  Around proximal great toe 8.3  (Blank rows = not tested)  TODAY'S TREATMENT:                                                                                                                           DATE:  08/08/22: MLD: in supine with HOB elevated as  follows: short neck, superficial and deep abdominals, Rt inguinal nodes and then R LE working proximal to distal, intact sequence, moving fluid towards inguinal nodes. Wrapped to knee high due to increase in size tg soft inside out, toes 1-4, artiflex to the knee, 8cm roman sandal and 2 10cm bandages from foot to knee Reviewed donning instructions  08/04/22: Manual Therapy MLD: in supine with HOB elevated as follows: short neck, superficial and deep abdominals, Rt inguinal nodes and then R LE working proximal to distal, intact sequence, moving fluid towards inguinal nodes. Educated pt to bring her bandages in next session so we can wrap her to reduce leg prior to her compression garments arriving.   07/30/22: Manual Therapy MLD: in supine with HOB elevated as follows: short neck, superficial and deep abdominals, Rt inguinal nodes and then R LE working proximal to distal, intact sequence, moving fluid towards inguinal nodes.  07/28/22: MLD: in supine with HOB elevated as follows: short neck, superficial and deep abdominals, Rt inguinal nodes and then R LE working proximal to distal, intact  sequence, moving fluid towards inguinal nodes. Compression bandaging to RLE in sitting: Cocoa butter applied, NEW TG soft from foot to mid thigh, elastomull to digits 1-4, artiflex around foot/ankle, rosidal from just below knee to mid thigh, 1 6cm bandage at foot, 1 8cm bandage at ankle, 1 12 cm from foot to knee, 1 12 cm from lower leg to mid thigh with spiral, 1 12 cm from lower leg to mid thigh  07/25/2022 MLD: in supine with HOB elevated as follows: short neck, No abdominals due to earlier stomach virus, Rt inguinal nodes and then R LE working proximal to distal, intact sequence, moving fluid towards inguinal nodes. Compression bandaging to RLE in sitting: Cocoa butter applied, NEW TG soft from foot to mid thigh, elastomull to digits 1-5, artiflex around foot/ankle, rosidal from just below knee to mid thigh, 1 6cm bandage at foot, 1 8cm bandage at ankle, 1 12 cm from foot to knee, 1 12 cm from lower leg to mid thigh with spiral, 1 12 cm from lower leg to mid thigh   07/18/22 Manual Therapy MLD: in supine with HOB elevated as follows: short neck, superficial and deep abdominals, Rt inguinal nodes and then R LE working proximal to distal, intact sequence, moving fluid towards inguinal nodes.  Compression bandaging to RLE in supine: Cocoa butter applied, TG soft from foot to mid thigh, elastomull to digits 1-4, artiflex around foot/ankle, rosidal from just below knee to mid thigh, 1 6cm bandage at foot, 1 8cm bandage at ankle, 1 12 cm from foot to knee, 1 12 cm from lower leg to mid thigh with spiral, 1 12 cm from lower leg to mid thigh  07/16/22 Manual Therapy MLD: in supine with HOB elevated as follows: short neck, superficial and deep abdominals, Rt inguinal nodes and then R LE working proximal to distal, intact sequence, moving fluid towards inguinal nodes.  Compression bandaging to RLE in supine: Cocoa butter applied,  TG soft from foot to mid thigh, elastomull to digits 1-4, artiflex around  foot/ankle, rosidal from just below knee to mid thigh, 1 6cm bandage at foot, 1 8cm bandage at ankle, 1 12 cm from foot to knee, 1 12 cm from lower leg to mid thigh with spiral, 1 12 cm from lower leg to mid thigh  07/14/22 Orthotic Fit At beginning of session measured pt for a [employer] and she fits well into the size of  Large and tall for length. She would like to see if her insurance coverage assessed so will fax her demographics to one of our DME companies to verify her coverage, per pt request.  Manual Therapy MLD: in supine with HOB elevated as follows: short neck, superficial and deep abdominals, Rt inguinal nodes and then R LE working proximal to distal, intact sequence, moving fluid towards inguinal nodes.  Compression bandaging to RLE in supine: Cocoa butter applied, TG soft from foot to mid thigh, elastomull to digits 1-4, artiflex around foot/ankle, rosidal from just below knee to mid thigh, 1 6cm bandage at foot, 1 8cm bandage at ankle, 1 12 cm from foot to knee, 1 12 cm from lower leg to mid thigh with spiral, 1 12 cm from lower leg to mid thigh      PATIENT EDUCATION:  Education details: causes of edema, how a DVT can lead to prolonged edema in combination with recent surgery, need for bandaging and then eventually compression garments Person educated: Patient Education method: Explanation Education comprehension: verbalized understanding  HOME EXERCISE PROGRAM: Wear compression bandaging 24/7 and remove if pain or numbness that does not go away when you move  ASSESSMENT:  CLINICAL IMPRESSION: Pt had an increase in size from not being bandaged x 1 week.  Wrapped to knee with instruction to keep it on and then switch to the new stocking tomorrow. Stockings set to arrive today.   OBJECTIVE IMPAIRMENTS: decreased knowledge of condition, decreased knowledge of use of DME, increased edema, and pain.   ACTIVITY LIMITATIONS: standing and standing,  walking  PARTICIPATION LIMITATIONS: occupation  PERSONAL FACTORS:  none  are also affecting patient's functional outcome.   REHAB POTENTIAL: Excellent  CLINICAL DECISION MAKING: Stable/uncomplicated  EVALUATION COMPLEXITY: Low  GOALS: Goals reviewed with patient? Yes  SHORT TERM GOALS=LONG TERM GOALS Target date: 09/05/2022    Pt will demonstrate a 4 cm decrease in edema 10 cm proximal to lateral malleoli to improve comfort.  Baseline: 26.5cm  Goal status: INITIAL  2.  Pt will obtain appropriate compression garments for long term management of edema.  Baseline:  Goal status: INITIAL  3.  Pt will be independent in self MLD for long term management of edema.  Baseline:  Goal status: INITIAL  4.  Pt will report a 75% improvement in discomfort and pain in RLE to allow improved ability to walk and stand.  Baseline:  Goal status: INITIAL  PLAN:  PT FREQUENCY: 3x/week  PT DURATION: 4 weeks  PLANNED INTERVENTIONS: Therapeutic exercises, Therapeutic activity, Patient/Family education, Self Care, Orthotic/Fit training, Manual lymph drainage, Compression bandaging, Taping, Vasopneumatic device, and Manual therapy  PLAN FOR NEXT SESSION:  how is new stocking? Pt needs to learn MLD   Stark Bray, PT 08/08/2022, 9:53 AM

## 2022-08-11 ENCOUNTER — Encounter: Payer: Self-pay | Admitting: Physical Therapy

## 2022-08-11 ENCOUNTER — Telehealth: Payer: Self-pay | Admitting: Radiology

## 2022-08-11 ENCOUNTER — Ambulatory Visit: Payer: Commercial Managed Care - HMO | Admitting: Physical Therapy

## 2022-08-11 DIAGNOSIS — R262 Difficulty in walking, not elsewhere classified: Secondary | ICD-10-CM

## 2022-08-11 DIAGNOSIS — R600 Localized edema: Secondary | ICD-10-CM

## 2022-08-11 NOTE — Telephone Encounter (Signed)
Patient left voicemail on triage line. She is status post right total knee arthroplasty 03/31/2022 and states that she is having increased difficulty walking today. She would like a call back.  CB [phone]

## 2022-08-11 NOTE — Telephone Encounter (Signed)
None I can think of. Thanks.

## 2022-08-11 NOTE — Therapy (Signed)
OUTPATIENT PHYSICAL THERAPY ONCOLOGY TREATMENT  Patient Name: Christina [name] MRN: [id number] DOB:Nov 24, 1965, 56 y.o., female Today's Date: 08/11/2022   PT End of Session - 08/11/22 1531     Visit Number 14   number unchanged - arrived no charge   Number of Visits 20    Date for PT Re-Evaluation 09/01/22    PT Start Time 2542    PT Stop Time 1525    PT Time Calculation (min) 20 min    Activity Tolerance Patient tolerated treatment well    Behavior During Therapy [facility] for tasks assessed/performed               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,  multifocal IDC, DCIS, ER/PR+,  01-09-2016 s/p bilteral mastectomies w/ left sln bx;  no chemoradiation   Cataracts, bilateral    Depression    Diabetes mellitus without complication ([place])    type 2   GAD (generalized anxiety disorder)    Gallbladder problem    History of ovarian cyst    IBS (irritable bowel syndrome)    Joint pain    PONV (postoperative nausea and vomiting)    does well with scop patch   Retinal detachment    Rheg OS   Right knee meniscal tear    Urgency of urination    Past Surgical History:  Procedure Laterality Date   ABDOMINAL HYSTERECTOMY  05/1996   endometriosis   ACHILLES TENDON REPAIR Right 2010;  revision 2011   BLADDER SUSPENSION  2000   BREAST BIOPSY Left 10/2015   BREAST IMPLANT REMOVAL Bilateral 08/12/2017   Procedure: REMOVAL BILATERAL BREAST IMPLANTS;  Surgeon: Wallace Going, DO;  Location: [place];  Service: Plastics;  Laterality: Bilateral;   BREAST RECONSTRUCTION WITH PLACEMENT OF TISSUE EXPANDER AND FLEX HD (ACELLULAR HYDRATED DERMIS) Bilateral 01/09/2016   BREAST RECONSTRUCTION WITH PLACEMENT OF TISSUE EXPANDER AND FLEX HD (ACELLULAR HYDRATED DERMIS) Bilateral 01/09/2016   Procedure: BREAST RECONSTRUCTION WITH PLACEMENT OF TISSUE  EXPANDER AND FLEX HD (ACELLULAR HYDRATED DERMIS);  Surgeon: Loel Lofty Dillingham, DO;  Location: [place];  Service: Plastics;  Laterality: Bilateral;   BREAST RECONSTRUCTION WITH PLACEMENT OF TISSUE EXPANDER AND FLEX HD (ACELLULAR HYDRATED DERMIS) Bilateral 05/29/2016   Procedure: PLACEMENT OF BILATERAL TISSUE EXPANDER AND FLEX HD (ACELLULAR HYDRATED DERMIS);  Surgeon: Wallace Going, DO;  Location: [place];  Service: Plastics;  Laterality: Bilateral;   BREAST REDUCTION SURGERY Bilateral 11/26/2016   Procedure: BILATERAL BREAST CAPSULE CONTRACTURE RELASE;  Surgeon: Wallace Going, DO;  Location: [place];  Service: Plastics;  Laterality: Bilateral;   [place]; 1989; [place] Right x3   last one 02-01-2019 @ [facility]   EYE SURGERY Left 10/01/2020   Pneumatic retinopexy for repair of rheg RD - Dr. Bernarda Caffey   EYE SURGERY Left 10/04/2020   PPV - Dr. Bernarda Caffey   FAT GRAFTING BILATERAL BREAST  08-09-2018  _0    GAS INSERTION Left 10/04/2020   Procedure: INSERTION OF GAS;  Surgeon: Bernarda Caffey, MD;  Location: [place];  Service: Ophthalmology;  Laterality: Left;   GAS/FLUID EXCHANGE Left 10/04/2020   Procedure: GAS/FLUID EXCHANGE;  Surgeon: Bernarda Caffey, MD;  Location: [place];  Service: Ophthalmology;  Laterality: Left;   INCISION AND DRAINAGE OF  WOUND Bilateral 02/11/2016   Procedure: IRRIGATION AND DEBRIDEMENT OF BILATERAL BREAST POCKET;  Surgeon: Wallace Going, DO;  Location: [place];  Service: Plastics;  Laterality: Bilateral;   KNEE ARTHROSCOPY WITH MEDIAL MENISECTOMY Right 12/20/2019   Procedure: KNEE ARTHROSCOPY WITH MEDIAL MENISECTOMY;  Surgeon: Marchia Bond, MD;  Location: [place];  Service: Orthopedics;  Laterality: Right;   KNEE ARTHROSCOPY WITH MEDIAL MENISECTOMY Right 10/30/2021   Procedure: RIGHT KNEE ARTHROSCOPY WITH PARTIAL MEDIAL MENISECTOMY SYNOVECTOMY;  Surgeon: Leandrew Koyanagi, MD;  Location: [place];  Service: Orthopedics;  Laterality: Right;   LAPAROSCOPIC APPENDECTOMY  04-07-2011   _0    w/ Excision peritoneal lipoma and lysis adhesions   LAPAROSCOPIC CHOLECYSTECTOMY  ~ [place] Right 10/04/2020   Procedure: LASER RETINOPEXY WITH INDIRECT LASER OPTHALMOSCOPE, RIGHT EYE;  Surgeon: Bernarda Caffey, MD;  Location: [place];  Service: Ophthalmology;  Laterality: Right;   LIPOSUCTION WITH LIPOFILLING Bilateral 11/26/2016   Procedure: LIPOFILLING FOR SYMMETRY;  Surgeon: Wallace Going, DO;  Location: [place];  Service: Plastics;  Laterality: Bilateral;   LIPOSUCTION WITH LIPOFILLING Bilateral 01/21/2017   Procedure: BILATERAL BREAST  LIPOFILLING FOR ASYMMETRY;  Surgeon: Wallace Going, DO;  Location: [place];  Service: Plastics;  Laterality: Bilateral;   LIPOSUCTION WITH LIPOFILLING Bilateral 06/28/2020   Procedure: Lipofilling bilateral breasts for asymmetry;  Surgeon: Wallace Going, DO;  Location: [place];  Service: Plastics;  Laterality: Bilateral;  90 min   MASTECTOMY Bilateral 01/09/2016   NIPPLE SPARING MASTECTOMY/SENTINAL LYMPH NODE BIOPSY/RECONSTRUCTION/PLACEMENT OF TISSUE EXPANDER Bilateral 01/09/2016   Procedure: BILATERAL NIPPLE SPARING MASTECTOMY WITH LEFT SENTINAL LYMPH NODE BIOPSY ;  Surgeon: Stark Klein, MD;  Location: [place];  Service: General;  Laterality: Bilateral;   PHOTOCOAGULATION WITH LASER Left 10/04/2020   Procedure: PHOTOCOAGULATION WITH LASER;  Surgeon: Bernarda Caffey, MD;  Location: [place];  Service: Ophthalmology;  Laterality: Left;   REMOVAL OF BILATERAL TISSUE EXPANDERS WITH PLACEMENT OF BILATERAL BREAST IMPLANTS Bilateral 08/20/2016   Procedure: REMOVAL OF BILATERAL TISSUE EXPANDERS WITH PLACEMENT OF BILATERAL SILICONE IMPLANTS;  Surgeon: Wallace Going, DO;  Location: [place];  Service: Plastics;  Laterality: Bilateral;    REMOVAL OF BILATERAL TISSUE EXPANDERS WITH PLACEMENT OF BILATERAL BREAST IMPLANTS Bilateral 11/05/2017   Procedure: REMOVAL OF BILATERAL TISSUE EXPANDERS WITH PLACEMENT OF BILATERAL BREAST SILICONE IMPLANTS;  Surgeon: Wallace Going, DO;  Location: [place];  Service: Plastics;  Laterality: Bilateral;   REMOVAL OF TISSUE EXPANDER Bilateral 02/11/2016   Procedure: REMOVAL OF BILATERAL TISSUE EXPANDERS AND FLEX HD REMOVAL;  Surgeon: Wallace Going, DO;  Location: [place];  Service: Plastics;  Laterality: Bilateral;   RETINAL DETACHMENT SURGERY Left 10/01/2020   Pneumatic retinopexy for repair of rheg RD - Dr. Bernarda Caffey   RETINAL DETACHMENT SURGERY Left 10/04/2020   PPV - Dr. Bernarda Caffey   SCLERAL BUCKLE Left 10/04/2020   Procedure: SCLERAL BUCKLE LEFT EYE;  Surgeon: Bernarda Caffey, MD;  Location: [place];  Service: Ophthalmology;  Laterality: Left;   TISSUE EXPANDER PLACEMENT Bilateral 08/12/2017   Procedure: PLACEMENT OF BILATERAL TISSUE EXPANDER;  Surgeon: Wallace Going, DO;  Location: [place];  Service: Plastics;  Laterality: Bilateral;   TOTAL KNEE ARTHROPLASTY Right 03/31/2022   Procedure: RIGHT TOTAL KNEE REPLACEMENT;  Surgeon: Leandrew Koyanagi, MD;  Location: [place];  Service: Orthopedics;  Laterality: Right;   TUBAL  LIGATION Bilateral 1991   VITRECTOMY 25 GAUGE WITH SCLERAL BUCKLE Left 10/04/2020   Procedure: 25 GAUGE PARS PLANA VITRECTOMY LEFT EYE ;  Surgeon: Bernarda Caffey, MD;  Location: [place];  Service: Ophthalmology;  Laterality: Left;   Patient Active Problem List   Diagnosis Date Noted   DVT (deep venous thrombosis) ([place]) 05/11/2022   Acute pulmonary embolism ([place]) 05/09/2022   Chest pain 05/09/2022   Elevated troponin 05/09/2022   Status post total right knee replacement 03/31/2022   Primary osteoarthritis of right knee 03/20/2022   Eating disorder 03/20/2022   Acute medial meniscus tear, right, initial encounter  10/01/2021   At risk for dehydration 01/28/2021   At risk for malnutrition 01/01/2021   Obesity, Class I, BMI 30-34.9 12/31/2020   Vitamin B12 deficiency 10/08/2020   At risk for depression 10/08/2020   Mood disorder ([place]), with emotional eating 09/24/2020   At risk for impaired metabolic function 36/14/4315   At risk for diabetes mellitus 07/30/2020   Vitamin D deficiency 07/02/2020   Depression 07/02/2020   At risk for side effect of medication 07/02/2020   Stress due to illness of family member-  daughter with drug addiction 05/15/2020   Depression, recurrent ([place]) 05/15/2020   Anemia 05/15/2020   Constipation 05/15/2020   Prediabetes 05/15/2020   Breast asymmetry following reconstructive surgery 04/24/2020   Acquired absence of breast 04/24/2020   S/P breast reconstruction, bilateral 04/24/2020   Acute medial meniscus tear of left knee 12/20/2019   Breast cancer, left ([place]) 01/09/2016   Genetic testing 40/04/6760   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   Family history of breast cancer in female 11/08/2015   Family history of colon cancer 11/08/2015   Malignant neoplasm of central portion of left breast in female, estrogen receptor positive ([place]) 10/31/2015   Endometriosis 03/11/2011    PCP: Maurice Small, MD  REFERRING PROVIDER: Nicholas Lose, MD   REFERRING DIAG: M79.89 (ICD-10-CM) - Right leg swelling   THERAPY DIAG:  Localized edema  Difficulty in walking, not elsewhere classified  ONSET DATE: 03/31/22  Rationale for Evaluation and Treatment Rehabilitation  SUBJECTIVE:                                                                                                                                                                                           SUBJECTIVE STATEMENT: My knee pain is 10/10. It feels warm to me compared to the other side. I did not get my garments.   PERTINENT HISTORY:  Right lower extremity swelling/ lymphedema with known right lower  extremity DVT 05/12/22 and hx of right total knee 03/31/22, L breast cancer  in 2017 with prophylactic bilateral mastectomy PAIN:  Are you having pain? Yes NPRS scale: 10/10 Pain location: knee Pain orientation: Right  PAIN TYPE: aching Pain description: constant  Aggravating factors: walking Relieving factors: not reported  PRECAUTIONS: Other: R TKA 03/31/22  WEIGHT BEARING RESTRICTIONS: No  FALLS:  Has patient fallen in last 6 months? 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  LEISURE: pt does not exercise  HAND DOMINANCE: right   PRIOR LEVEL OF FUNCTION: Independent  PATIENT GOALS: to walk without a cane, to walk where it does not hurt as much   OBJECTIVE:  COGNITION: Overall cognitive status: Within functional limits for tasks assessed   PALPATION: Fibrosis throughout R LE, pitting edema  OBSERVATIONS / OTHER ASSESSMENTS: RLE from knee down approx twice the size of left    LOWER EXTREMITY [facility] RIGHT eval 07/04/22 07/07/22 07/11/22 07/18/22 07/30/22 08/08/22  At groin         30 cm proximal to suprapatella         20 cm proximal to suprapatella 55 55 55 55.4 57.2 56.4 55  10 cm proximal to suprapatella 48 47 47.5 46.6 48.2 48.4 47  At midpatella / popliteal crease 43.9 44 43 42 42.6 42.6 42.6  30 cm proximal to floor at lateral plantar foot 39.8 37.5 39.6 36.5 34.9 37.5 39  20 cm proximal to floor at lateral plantar foot 31.9 31 30.7 28.6 26.9 29._0 cm proximal to floor at lateral plantar foot 26.5 24.7 25 22.6 23.1 23.9 24.5  Circumference of ankle/heel         5 cm proximal to 1st MTP joint 22._1 21.9 22.5 23.4 22.8  Across MTP joint 23.1 23 22.2 22 22.3 22.4 22.4  Around proximal  great toe 8.6 8.6 8.4 8.4 8.3 8.8 8.3  (Blank rows = not tested)  LOWER EXTREMITY [facility] LEFT eval  At groin   30 cm proximal to suprapatella   20 cm proximal to suprapatella 56.2  10 cm proximal to suprapatella 47.5  At midpatella / popliteal crease 40.5  30 cm proximal to floor at lateral plantar foot 36.9  20 cm proximal to floor at lateral plantar foot 26.8  10 cm proximal to floor at lateral plantar foot 21.9  Circumference of ankle/heel   5 cm proximal to 1st MTP joint 22.1  Across MTP joint 22.2  Around proximal great toe 8.3  (Blank rows = not tested)  TODAY'S TREATMENT:                                                                                                                           DATE:  08/11/22: see assessment no tx performed today 08/08/22:  MLD: in supine with HOB elevated as follows: short neck, superficial and deep abdominals, Rt inguinal nodes and then R LE working proximal to distal, intact sequence, moving fluid towards inguinal nodes. Wrapped to knee high due to increase in size tg soft inside out, toes 1-4, artiflex to the knee, 8cm roman sandal and 2 10cm bandages from foot to knee Reviewed donning instructions  08/04/22: Manual Therapy MLD: in supine with HOB elevated as follows: short neck, superficial and deep abdominals, Rt inguinal nodes and then R LE working proximal to distal, intact sequence, moving fluid towards inguinal nodes. Educated pt to bring her bandages in next session so we can wrap her to reduce leg prior to her compression garments arriving.   07/30/22: Manual Therapy MLD: in supine with HOB elevated as follows: short neck, superficial and deep abdominals, Rt inguinal nodes and then R LE working proximal to distal, intact sequence, moving fluid towards inguinal nodes.  07/28/22: MLD: in supine with HOB elevated as follows: short neck, superficial and deep abdominals, Rt inguinal nodes and then R LE working proximal to distal, intact  sequence, moving fluid towards inguinal nodes. Compression bandaging to RLE in sitting: Cocoa butter applied, NEW TG soft from foot to mid thigh, elastomull to digits 1-4, artiflex around foot/ankle, rosidal from just below knee to mid thigh, 1 6cm bandage at foot, 1 8cm bandage at ankle, 1 12 cm from foot to knee, 1 12 cm from lower leg to mid thigh with spiral, 1 12 cm from lower leg to mid thigh  07/25/2022 MLD: in supine with HOB elevated as follows: short neck, No abdominals due to earlier stomach virus, Rt inguinal nodes and then R LE working proximal to distal, intact sequence, moving fluid towards inguinal nodes. Compression bandaging to RLE in sitting: Cocoa butter applied, NEW TG soft from foot to mid thigh, elastomull to digits 1-5, artiflex around foot/ankle, rosidal from just below knee to mid thigh, 1 6cm bandage at foot, 1 8cm bandage at ankle, 1 12 cm from foot to knee, 1 12 cm from lower leg to mid thigh with spiral, 1 12 cm from lower leg to mid thigh   07/18/22 Manual Therapy MLD: in supine with HOB elevated as follows: short neck, superficial and deep abdominals, Rt inguinal nodes and then R LE working proximal to distal, intact sequence, moving fluid towards inguinal nodes.  Compression bandaging to RLE in supine: Cocoa butter applied, TG soft from foot to mid thigh, elastomull to digits 1-4, artiflex around foot/ankle, rosidal from just below knee to mid thigh, 1 6cm bandage at foot, 1 8cm bandage at ankle, 1 12 cm from foot to knee, 1 12 cm from lower leg to mid thigh with spiral, 1 12 cm from lower leg to mid thigh  07/16/22 Manual Therapy MLD: in supine with HOB elevated as follows: short neck, superficial and deep abdominals, Rt inguinal nodes and then R LE working proximal to distal, intact sequence, moving fluid towards inguinal nodes.  Compression bandaging to RLE in supine: Cocoa butter applied,  TG soft from foot to mid thigh, elastomull to digits 1-4, artiflex around  foot/ankle, rosidal from just below knee to mid thigh, 1 6cm bandage at foot, 1 8cm bandage at ankle, 1 12 cm from foot to knee, 1 12 cm from lower leg to mid thigh with spiral, 1 12 cm from lower leg to mid thigh  07/14/22 Orthotic Fit At beginning of session measured pt for a [employer] and  she fits well into the size of Large and tall for length. She would like to see if her insurance coverage assessed so will fax her demographics to one of our DME companies to verify her coverage, per pt request.  Manual Therapy MLD: in supine with HOB elevated as follows: short neck, superficial and deep abdominals, Rt inguinal nodes and then R LE working proximal to distal, intact sequence, moving fluid towards inguinal nodes.  Compression bandaging to RLE in supine: Cocoa butter applied, TG soft from foot to mid thigh, elastomull to digits 1-4, artiflex around foot/ankle, rosidal from just below knee to mid thigh, 1 6cm bandage at foot, 1 8cm bandage at ankle, 1 12 cm from foot to knee, 1 12 cm from lower leg to mid thigh with spiral, 1 12 cm from lower leg to mid thigh      PATIENT EDUCATION:  Education details: causes of edema, how a DVT can lead to prolonged edema in combination with recent surgery, need for bandaging and then eventually compression garments Person educated: Patient Education method: Explanation Education comprehension: verbalized understanding  HOME EXERCISE PROGRAM: Wear compression bandaging 24/7 and remove if pain or numbness that does not go away when you move  ASSESSMENT:  CLINICAL IMPRESSION: Pt arrived for her appointment but was having 10/10 knee pain and reports it began yesterday. She had to remove her bandages secondary to pain. There is a visible increase in edema at knee and thigh. Therapist could feel a temperature difference between L and R side. There was no redness. Due to fear of possible infection therapist did not proceed with MLD and bandaging and  encouraged pt to follow up with her doctor. Also provided pt with tracking number for her compression garments. They were supposed to have been delivered on Friday and there was a picture of them at her stairs provided by delivery driver but pt has not seen them. She plans to follow up.   OBJECTIVE IMPAIRMENTS: decreased knowledge of condition, decreased knowledge of use of DME, increased edema, and pain.   ACTIVITY LIMITATIONS: standing and standing, walking  PARTICIPATION LIMITATIONS: occupation  PERSONAL FACTORS:  none  are also affecting patient's functional outcome.   REHAB POTENTIAL: Excellent  CLINICAL DECISION MAKING: Stable/uncomplicated  EVALUATION COMPLEXITY: Low  GOALS: Goals reviewed with patient? Yes  SHORT TERM GOALS=LONG TERM GOALS Target date: 09/08/2022    Pt will demonstrate a 4 cm decrease in edema 10 cm proximal to lateral malleoli to improve comfort.  Baseline: 26.5cm  Goal status: INITIAL  2.  Pt will obtain appropriate compression garments for long term management of edema.  Baseline:  Goal status: INITIAL  3.  Pt will be independent in self MLD for long term management of edema.  Baseline:  Goal status: INITIAL  4.  Pt will report a 75% improvement in discomfort and pain in RLE to allow improved ability to walk and stand.  Baseline:  Goal status: INITIAL  PLAN:  PT FREQUENCY: 3x/week  PT DURATION: 4 weeks  PLANNED INTERVENTIONS: Therapeutic exercises, Therapeutic activity, Patient/Family education, Self Care, Orthotic/Fit training, Manual lymph drainage, Compression bandaging, Taping, Vasopneumatic device, and Manual therapy  PLAN FOR NEXT SESSION:  what did dr say? how is new stocking? Pt needs to learn MLD   Manus Gunning, PT 08/11/2022, 3:38 PM

## 2022-08-12 ENCOUNTER — Ambulatory Visit (INDEPENDENT_AMBULATORY_CARE_PROVIDER_SITE_OTHER): Payer: Commercial Managed Care - HMO

## 2022-08-12 ENCOUNTER — Ambulatory Visit (INDEPENDENT_AMBULATORY_CARE_PROVIDER_SITE_OTHER): Payer: Commercial Managed Care - HMO | Admitting: Orthopaedic Surgery

## 2022-08-12 DIAGNOSIS — Z96651 Presence of right artificial knee joint: Secondary | ICD-10-CM

## 2022-08-12 NOTE — Progress Notes (Signed)
Post-Op Visit Note   Patient: Christina [name]           Date of Birth: 04-02-1966           MRN: [id number] Visit Date: 08/12/2022 PCP: Maurice Small, MD   Assessment & Plan:  Chief Complaint:  Chief Complaint  Patient presents with   Right Knee - Pain   Visit Diagnoses:  1. H/O total knee replacement, right     Plan: Patient is a pleasant 56 year old female who comes in today with concerns about her right knee.  She is status post right total knee replacement 03/31/2022 she was doing okay but started to experience increased pain about 2 days ago.  No new injury or change in activity that she can think of.  The pain she is having is the anteromedial aspect and primarily occurs when bearing weight.  She has been taking Tylenol and tramadol which has helped.  Of note, she was recently discharged from physical therapy as she was a no-show for 2 weeks time.  She is also status post right lower extremity DVT and subsequent PE following total knee replacement.  Currently on Xarelto.  She is getting wraps 3 times a week for the lower extremity swelling.  Examination of her right knee reveals a small effusion.  Moderate swelling to the right lower extremity.  The right calf circumference is noticeably increased compared to left calf.  Mild warmth.  No erythema.  Well-healed surgical scar without complication.  Range of motion 0 to 120 degrees.  She is stable to valgus and varus stress.  She is neurovascularly intact distally.  At this point, my suspicion for infection is very low.  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  There is also a chance she could have pain from micromotion of the implant.  We would like to give this time to see.  In the meantime, we have recommended compression socks.  She will follow-up with [name] in 6 months for recheck.  She will call with any concerns or questions in the meantime.  Follow-Up Instructions: Return in  about 6 months (around 02/11/2023).   Orders:  Orders Placed This Encounter  Procedures   XR Knee 1-2 Views Right   No orders of the defined types were placed in this encounter.   Imaging: XR Knee 1-2 Views Right  Result Date: 08/12/2022 Well-seated prosthesis without complication   PMFS History: Patient Active Problem List   Diagnosis Date Noted   DVT (deep venous thrombosis) ([place]) 05/11/2022   Acute pulmonary embolism ([place]) 05/09/2022   Chest pain 05/09/2022   Elevated troponin 05/09/2022   Status post total right knee replacement 03/31/2022   Primary osteoarthritis of right knee 03/20/2022   Eating disorder 03/20/2022   Acute medial meniscus tear, right, initial encounter 10/01/2021   At risk for dehydration 01/28/2021   At risk for malnutrition 01/01/2021   Obesity, Class I, BMI 30-34.9 12/31/2020   Vitamin B12 deficiency 10/08/2020   At risk for depression 10/08/2020   Mood disorder ([place]), with emotional eating 09/24/2020   At risk for impaired metabolic function 57/26/2035   At risk for diabetes mellitus 07/30/2020   Vitamin D deficiency 07/02/2020   Depression 07/02/2020   At risk for side effect of medication 07/02/2020   Stress due to illness of family member-  daughter with drug addiction 05/15/2020   Depression, recurrent ([place]) 05/15/2020   Anemia 05/15/2020   Constipation 05/15/2020  Prediabetes 05/15/2020   Breast asymmetry following reconstructive surgery 04/24/2020   Acquired absence of breast 04/24/2020   S/P breast reconstruction, bilateral 04/24/2020   Acute medial meniscus tear of left knee 12/20/2019   Breast cancer, left ([place]) 01/09/2016   Genetic testing 08/67/6195   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   Family history of breast cancer in female 11/08/2015   Family history of colon cancer 11/08/2015   Malignant neoplasm of central portion of left breast in female, estrogen receptor positive ([place]) 10/31/2015   Endometriosis 03/11/2011    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,  multifocal IDC, DCIS, ER/PR+,  01-09-2016 s/p bilteral mastectomies w/ left sln bx;  no chemoradiation   Cataracts, bilateral    Depression    Diabetes mellitus without complication (HCC)    type 2   GAD (generalized anxiety disorder)    Gallbladder problem    History of ovarian cyst    IBS (irritable bowel syndrome)    Joint pain    PONV (postoperative nausea and vomiting)    does well with scop patch   Retinal detachment    Rheg OS   Right knee meniscal tear    Urgency of urination     Family History  Problem Relation Age of Onset   Heart failure Father    Prostate cancer Father 44   Retinal detachment Father    Colon polyps Mother        approx 2   Other Mother        hx HPV and hysterectomy due to precancerous cells   Depression Mother    Anxiety disorder Mother    Other Sister 94       hx of hysterectomy for unspecified reason; still has ovaries   Other Sister 30       paternal half-sister hx of hysterectomy for unspecified reason; still has ovaries   Bladder Cancer Maternal Uncle 79       not a smoker   Kidney failure Maternal Grandmother    Congestive Heart Failure Maternal Grandmother    Colon cancer Maternal Grandmother 70   Diabetes Maternal Grandmother    Lung cancer Maternal Grandfather 66       smoker   Breast cancer Paternal Grandmother        dx. early 65s; w/ hx of trauma to breast   Crohn's disease Daughter    Lung cancer Maternal Uncle 37       smoker    Past Surgical History:  Procedure Laterality Date   ABDOMINAL HYSTERECTOMY  05/1996   endometriosis   ACHILLES TENDON REPAIR Right 2010;  revision 2011   BLADDER SUSPENSION  2000   BREAST BIOPSY Left 10/2015   BREAST IMPLANT REMOVAL Bilateral 08/12/2017   Procedure: REMOVAL BILATERAL BREAST IMPLANTS;  Surgeon: Wallace Going, DO;  Location: [place];  Service: Plastics;  Laterality: Bilateral;   BREAST RECONSTRUCTION WITH PLACEMENT OF TISSUE EXPANDER AND FLEX HD (ACELLULAR HYDRATED DERMIS) Bilateral 01/09/2016   BREAST RECONSTRUCTION WITH PLACEMENT OF TISSUE EXPANDER AND FLEX HD (ACELLULAR HYDRATED DERMIS) Bilateral 01/09/2016   Procedure: BREAST RECONSTRUCTION WITH PLACEMENT OF TISSUE EXPANDER AND FLEX HD (ACELLULAR HYDRATED DERMIS);  Surgeon: Loel Lofty Dillingham, DO;  Location: [place];  Service: Plastics;  Laterality: Bilateral;   BREAST RECONSTRUCTION WITH PLACEMENT  OF TISSUE EXPANDER AND FLEX HD (ACELLULAR HYDRATED DERMIS) Bilateral 05/29/2016   Procedure: PLACEMENT OF BILATERAL TISSUE EXPANDER AND FLEX HD (ACELLULAR HYDRATED DERMIS);  Surgeon: Wallace Going, DO;  Location: [place];  Service: Plastics;  Laterality: Bilateral;   BREAST REDUCTION SURGERY Bilateral 11/26/2016   Procedure: BILATERAL BREAST CAPSULE CONTRACTURE RELASE;  Surgeon: Wallace Going, DO;  Location: [place];  Service: Plastics;  Laterality: Bilateral;   [place]; 1989; [place] Right x3   last one 02-01-2019 @ [facility]   EYE SURGERY Left 10/01/2020   Pneumatic retinopexy for repair of rheg RD - Dr. Bernarda Caffey   EYE SURGERY Left 10/04/2020   PPV - Dr. Bernarda Caffey   FAT GRAFTING BILATERAL BREAST  08-09-2018  _0    GAS INSERTION Left 10/04/2020   Procedure: INSERTION OF GAS;  Surgeon: Bernarda Caffey, MD;  Location: [place];  Service: Ophthalmology;  Laterality: Left;   GAS/FLUID EXCHANGE Left 10/04/2020   Procedure: GAS/FLUID EXCHANGE;  Surgeon: Bernarda Caffey, MD;  Location: [place];  Service: Ophthalmology;  Laterality: Left;   INCISION AND DRAINAGE OF WOUND Bilateral 02/11/2016   Procedure: IRRIGATION AND DEBRIDEMENT OF BILATERAL BREAST POCKET;  Surgeon: Wallace Going, DO;  Location: [place];  Service: Plastics;  Laterality: Bilateral;    KNEE ARTHROSCOPY WITH MEDIAL MENISECTOMY Right 12/20/2019   Procedure: KNEE ARTHROSCOPY WITH MEDIAL MENISECTOMY;  Surgeon: Marchia Bond, MD;  Location: [place];  Service: Orthopedics;  Laterality: Right;   KNEE ARTHROSCOPY WITH MEDIAL MENISECTOMY Right 10/30/2021   Procedure: RIGHT KNEE ARTHROSCOPY WITH PARTIAL MEDIAL MENISECTOMY SYNOVECTOMY;  Surgeon: Leandrew Koyanagi, MD;  Location: [place];  Service: Orthopedics;  Laterality: Right;   LAPAROSCOPIC APPENDECTOMY  04-07-2011   _1    w/ Excision peritoneal lipoma and lysis adhesions   LAPAROSCOPIC CHOLECYSTECTOMY  ~ [name] Right 10/04/2020   Procedure: LASER RETINOPEXY WITH INDIRECT LASER OPTHALMOSCOPE, RIGHT EYE;  Surgeon: Bernarda Caffey, MD;  Location: [place];  Service: Ophthalmology;  Laterality: Right;   LIPOSUCTION WITH LIPOFILLING Bilateral 11/26/2016   Procedure: LIPOFILLING FOR SYMMETRY;  Surgeon: Wallace Going, DO;  Location: [place];  Service: Plastics;  Laterality: Bilateral;   LIPOSUCTION WITH LIPOFILLING Bilateral 01/21/2017   Procedure: BILATERAL BREAST  LIPOFILLING FOR ASYMMETRY;  Surgeon: Wallace Going, DO;  Location: [place];  Service: Plastics;  Laterality: Bilateral;   LIPOSUCTION WITH LIPOFILLING Bilateral 06/28/2020   Procedure: Lipofilling bilateral breasts for asymmetry;  Surgeon: Wallace Going, DO;  Location: [place];  Service: Plastics;  Laterality: Bilateral;  90 min   MASTECTOMY Bilateral 01/09/2016   NIPPLE SPARING MASTECTOMY/SENTINAL LYMPH NODE BIOPSY/RECONSTRUCTION/PLACEMENT OF TISSUE EXPANDER Bilateral 01/09/2016   Procedure: BILATERAL NIPPLE SPARING MASTECTOMY WITH LEFT SENTINAL LYMPH NODE BIOPSY ;  Surgeon: Stark Klein, MD;  Location: [place];  Service: General;  Laterality: Bilateral;   PHOTOCOAGULATION WITH LASER Left 10/04/2020   Procedure: PHOTOCOAGULATION WITH LASER;  Surgeon: Bernarda Caffey,  MD;  Location: [place];  Service: Ophthalmology;  Laterality: Left;   REMOVAL OF BILATERAL TISSUE EXPANDERS WITH PLACEMENT OF BILATERAL BREAST IMPLANTS Bilateral 08/20/2016   Procedure: REMOVAL OF BILATERAL TISSUE EXPANDERS WITH PLACEMENT OF BILATERAL SILICONE IMPLANTS;  Surgeon: Wallace Going, DO;  Location: [place];  Service: Plastics;  Laterality: Bilateral;   REMOVAL OF BILATERAL TISSUE EXPANDERS WITH PLACEMENT OF BILATERAL BREAST IMPLANTS Bilateral 11/05/2017  Procedure: REMOVAL OF BILATERAL TISSUE EXPANDERS WITH PLACEMENT OF BILATERAL BREAST SILICONE IMPLANTS;  Surgeon: Wallace Going, DO;  Location: [place];  Service: Plastics;  Laterality: Bilateral;   REMOVAL OF TISSUE EXPANDER Bilateral 02/11/2016   Procedure: REMOVAL OF BILATERAL TISSUE EXPANDERS AND FLEX HD REMOVAL;  Surgeon: Wallace Going, DO;  Location: [place];  Service: Plastics;  Laterality: Bilateral;   RETINAL DETACHMENT SURGERY Left 10/01/2020   Pneumatic retinopexy for repair of rheg RD - Dr. Bernarda Caffey   RETINAL DETACHMENT SURGERY Left 10/04/2020   PPV - Dr. Bernarda Caffey   SCLERAL BUCKLE Left 10/04/2020   Procedure: SCLERAL BUCKLE LEFT EYE;  Surgeon: Bernarda Caffey, MD;  Location: [place];  Service: Ophthalmology;  Laterality: Left;   TISSUE EXPANDER PLACEMENT Bilateral 08/12/2017   Procedure: PLACEMENT OF BILATERAL TISSUE EXPANDER;  Surgeon: Wallace Going, DO;  Location: [place];  Service: Plastics;  Laterality: Bilateral;   TOTAL KNEE ARTHROPLASTY Right 03/31/2022   Procedure: RIGHT TOTAL KNEE REPLACEMENT;  Surgeon: Leandrew Koyanagi, MD;  Location: [place];  Service: Orthopedics;  Laterality: Right;   TUBAL LIGATION Bilateral 1991   VITRECTOMY 25 GAUGE WITH SCLERAL BUCKLE Left 10/04/2020   Procedure: 25 GAUGE PARS PLANA VITRECTOMY LEFT EYE ;  Surgeon: Bernarda Caffey, MD;  Location: [place];  Service: Ophthalmology;  Laterality: Left;   Social  History   Occupational History   Occupation: Holiday representative: Education administrator  Tobacco Use   Smoking status: Former    Packs/day: 1.00    Years: 10.00    Total pack years: 10.00    Types: Cigarettes    Quit date: 06/08/2008    Years since quitting: 14.1   Smokeless tobacco: Never  Vaping Use   Vaping Use: Never used  Substance and Sexual Activity   Alcohol use: No   Drug use: Never   Sexual activity: Never    Birth control/protection: Surgical    Comment: hysterectomy

## 2022-08-12 NOTE — Telephone Encounter (Signed)
Patient received after I left for the day. She is coming in this morning for recheck.

## 2022-08-13 ENCOUNTER — Encounter: Payer: Self-pay | Admitting: Rehabilitation

## 2022-08-13 ENCOUNTER — Ambulatory Visit: Payer: Commercial Managed Care - HMO | Admitting: Rehabilitation

## 2022-08-13 DIAGNOSIS — R600 Localized edema: Secondary | ICD-10-CM | POA: Diagnosis not present

## 2022-08-13 DIAGNOSIS — M25661 Stiffness of right knee, not elsewhere classified: Secondary | ICD-10-CM

## 2022-08-13 DIAGNOSIS — Z96651 Presence of right artificial knee joint: Secondary | ICD-10-CM

## 2022-08-13 DIAGNOSIS — G8929 Other chronic pain: Secondary | ICD-10-CM

## 2022-08-13 DIAGNOSIS — R262 Difficulty in walking, not elsewhere classified: Secondary | ICD-10-CM

## 2022-08-13 DIAGNOSIS — M6281 Muscle weakness (generalized): Secondary | ICD-10-CM

## 2022-08-13 NOTE — Therapy (Signed)
OUTPATIENT PHYSICAL THERAPY ONCOLOGY TREATMENT  Patient Name: Christina [name] MRN: [id number] DOB:03-14-1966, 56 y.o., female Today's Date: 08/13/2022   PT End of Session - 08/13/22 0946     Visit Number 15    Number of Visits 20    Date for PT Re-Evaluation 09/01/22    PT Start Time 0909    PT Stop Time 1000    PT Time Calculation (min) 51 min    Activity Tolerance Patient tolerated treatment well    Behavior During Therapy [facility] for tasks assessed/performed                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,  multifocal IDC, DCIS, ER/PR+,  01-09-2016 s/p bilteral mastectomies w/ left sln bx;  no chemoradiation   Cataracts, bilateral    Depression    Diabetes mellitus without complication ([place])    type 2   GAD (generalized anxiety disorder)    Gallbladder problem    History of ovarian cyst    IBS (irritable bowel syndrome)    Joint pain    PONV (postoperative nausea and vomiting)    does well with scop patch   Retinal detachment    Rheg OS   Right knee meniscal tear    Urgency of urination    Past Surgical History:  Procedure Laterality Date   ABDOMINAL HYSTERECTOMY  05/1996   endometriosis   ACHILLES TENDON REPAIR Right 2010;  revision 2011   BLADDER SUSPENSION  2000   BREAST BIOPSY Left 10/2015   BREAST IMPLANT REMOVAL Bilateral 08/12/2017   Procedure: REMOVAL BILATERAL BREAST IMPLANTS;  Surgeon: Wallace Going, DO;  Location: [place];  Service: Plastics;  Laterality: Bilateral;   BREAST RECONSTRUCTION WITH PLACEMENT OF TISSUE EXPANDER AND FLEX HD (ACELLULAR HYDRATED DERMIS) Bilateral 01/09/2016   BREAST RECONSTRUCTION WITH PLACEMENT OF TISSUE EXPANDER AND FLEX HD (ACELLULAR HYDRATED DERMIS) Bilateral 01/09/2016   Procedure: BREAST RECONSTRUCTION WITH PLACEMENT OF TISSUE EXPANDER AND FLEX HD (ACELLULAR  HYDRATED DERMIS);  Surgeon: Loel Lofty Dillingham, DO;  Location: [place];  Service: Plastics;  Laterality: Bilateral;   BREAST RECONSTRUCTION WITH PLACEMENT OF TISSUE EXPANDER AND FLEX HD (ACELLULAR HYDRATED DERMIS) Bilateral 05/29/2016   Procedure: PLACEMENT OF BILATERAL TISSUE EXPANDER AND FLEX HD (ACELLULAR HYDRATED DERMIS);  Surgeon: Wallace Going, DO;  Location: [place];  Service: Plastics;  Laterality: Bilateral;   BREAST REDUCTION SURGERY Bilateral 11/26/2016   Procedure: BILATERAL BREAST CAPSULE CONTRACTURE RELASE;  Surgeon: Wallace Going, DO;  Location: [place];  Service: Plastics;  Laterality: Bilateral;   [place]; 1989; [place] Right x3   last one 02-01-2019 @ [facility]   EYE SURGERY Left 10/01/2020   Pneumatic retinopexy for repair of rheg RD - Dr. Bernarda Caffey   EYE SURGERY Left 10/04/2020   PPV - Dr. Bernarda Caffey   FAT GRAFTING BILATERAL BREAST  08-09-2018  _0    GAS INSERTION Left 10/04/2020   Procedure: INSERTION OF GAS;  Surgeon: Bernarda Caffey, MD;  Location: [place];  Service: Ophthalmology;  Laterality: Left;   GAS/FLUID EXCHANGE Left 10/04/2020   Procedure: GAS/FLUID EXCHANGE;  Surgeon: Bernarda Caffey, MD;  Location: [place];  Service: Ophthalmology;  Laterality: Left;   INCISION AND DRAINAGE OF WOUND Bilateral 02/11/2016   Procedure:  IRRIGATION AND DEBRIDEMENT OF BILATERAL BREAST POCKET;  Surgeon: Wallace Going, DO;  Location: [place];  Service: Plastics;  Laterality: Bilateral;   KNEE ARTHROSCOPY WITH MEDIAL MENISECTOMY Right 12/20/2019   Procedure: KNEE ARTHROSCOPY WITH MEDIAL MENISECTOMY;  Surgeon: Marchia Bond, MD;  Location: [place];  Service: Orthopedics;  Laterality: Right;   KNEE ARTHROSCOPY WITH MEDIAL MENISECTOMY Right 10/30/2021   Procedure: RIGHT KNEE ARTHROSCOPY WITH PARTIAL MEDIAL MENISECTOMY SYNOVECTOMY;  Surgeon: Leandrew Koyanagi, MD;  Location: [place];  Service: Orthopedics;  Laterality: Right;   LAPAROSCOPIC APPENDECTOMY  04-07-2011   _0    w/ Excision peritoneal lipoma and lysis adhesions   LAPAROSCOPIC CHOLECYSTECTOMY  ~ [place] Right 10/04/2020   Procedure: LASER RETINOPEXY WITH INDIRECT LASER OPTHALMOSCOPE, RIGHT EYE;  Surgeon: Bernarda Caffey, MD;  Location: [place];  Service: Ophthalmology;  Laterality: Right;   LIPOSUCTION WITH LIPOFILLING Bilateral 11/26/2016   Procedure: LIPOFILLING FOR SYMMETRY;  Surgeon: Wallace Going, DO;  Location: [place];  Service: Plastics;  Laterality: Bilateral;   LIPOSUCTION WITH LIPOFILLING Bilateral 01/21/2017   Procedure: BILATERAL BREAST  LIPOFILLING FOR ASYMMETRY;  Surgeon: Wallace Going, DO;  Location: [place];  Service: Plastics;  Laterality: Bilateral;   LIPOSUCTION WITH LIPOFILLING Bilateral 06/28/2020   Procedure: Lipofilling bilateral breasts for asymmetry;  Surgeon: Wallace Going, DO;  Location: [place];  Service: Plastics;  Laterality: Bilateral;  90 min   MASTECTOMY Bilateral 01/09/2016   NIPPLE SPARING MASTECTOMY/SENTINAL LYMPH NODE BIOPSY/RECONSTRUCTION/PLACEMENT OF TISSUE EXPANDER Bilateral 01/09/2016   Procedure: BILATERAL NIPPLE SPARING MASTECTOMY WITH LEFT SENTINAL LYMPH NODE BIOPSY ;  Surgeon: Stark Klein, MD;  Location: [place];  Service: General;  Laterality: Bilateral;   PHOTOCOAGULATION WITH LASER Left 10/04/2020   Procedure: PHOTOCOAGULATION WITH LASER;  Surgeon: Bernarda Caffey, MD;  Location: [place];  Service: Ophthalmology;  Laterality: Left;   REMOVAL OF BILATERAL TISSUE EXPANDERS WITH PLACEMENT OF BILATERAL BREAST IMPLANTS Bilateral 08/20/2016   Procedure: REMOVAL OF BILATERAL TISSUE EXPANDERS WITH PLACEMENT OF BILATERAL SILICONE IMPLANTS;  Surgeon: Wallace Going, DO;  Location: [place];  Service: Plastics;  Laterality: Bilateral;   REMOVAL OF BILATERAL TISSUE  EXPANDERS WITH PLACEMENT OF BILATERAL BREAST IMPLANTS Bilateral 11/05/2017   Procedure: REMOVAL OF BILATERAL TISSUE EXPANDERS WITH PLACEMENT OF BILATERAL BREAST SILICONE IMPLANTS;  Surgeon: Wallace Going, DO;  Location: [place];  Service: Plastics;  Laterality: Bilateral;   REMOVAL OF TISSUE EXPANDER Bilateral 02/11/2016   Procedure: REMOVAL OF BILATERAL TISSUE EXPANDERS AND FLEX HD REMOVAL;  Surgeon: Wallace Going, DO;  Location: [place];  Service: Plastics;  Laterality: Bilateral;   RETINAL DETACHMENT SURGERY Left 10/01/2020   Pneumatic retinopexy for repair of rheg RD - Dr. Bernarda Caffey   RETINAL DETACHMENT SURGERY Left 10/04/2020   PPV - Dr. Bernarda Caffey   SCLERAL BUCKLE Left 10/04/2020   Procedure: SCLERAL BUCKLE LEFT EYE;  Surgeon: Bernarda Caffey, MD;  Location: [place];  Service: Ophthalmology;  Laterality: Left;   TISSUE EXPANDER PLACEMENT Bilateral 08/12/2017   Procedure: PLACEMENT OF BILATERAL TISSUE EXPANDER;  Surgeon: Wallace Going, DO;  Location: [place];  Service: Plastics;  Laterality: Bilateral;   TOTAL KNEE ARTHROPLASTY Right 03/31/2022   Procedure: RIGHT TOTAL KNEE REPLACEMENT;  Surgeon: Leandrew Koyanagi, MD;  Location: [place];  Service: Orthopedics;  Laterality: Right;   TUBAL LIGATION Bilateral 1991   VITRECTOMY  [place] BUCKLE Left 10/04/2020   Procedure: [place] VITRECTOMY LEFT EYE ;  Surgeon: Bernarda Caffey, MD;  Location: [place];  Service: Ophthalmology;  Laterality: Left;   Patient Active Problem List   Diagnosis Date Noted   DVT (deep venous thrombosis) ([place]) 05/11/2022   Acute pulmonary embolism ([place]) 05/09/2022   Chest pain 05/09/2022   Elevated troponin 05/09/2022   Status post total right knee replacement 03/31/2022   Primary osteoarthritis of right knee 03/20/2022   Eating disorder 03/20/2022   Acute medial meniscus tear, right, initial encounter 10/01/2021   At risk for  dehydration 01/28/2021   At risk for malnutrition 01/01/2021   Obesity, Class I, BMI 30-34.9 12/31/2020   Vitamin B12 deficiency 10/08/2020   At risk for depression 10/08/2020   Mood disorder ([place]), with emotional eating 09/24/2020   At risk for impaired metabolic function 38/18/4037   At risk for diabetes mellitus 07/30/2020   Vitamin D deficiency 07/02/2020   Depression 07/02/2020   At risk for side effect of medication 07/02/2020   Stress due to illness of family member-  daughter with drug addiction 05/15/2020   Depression, recurrent ([place]) 05/15/2020   Anemia 05/15/2020   Constipation 05/15/2020   Prediabetes 05/15/2020   Breast asymmetry following reconstructive surgery 04/24/2020   Acquired absence of breast 04/24/2020   S/P breast reconstruction, bilateral 04/24/2020   Acute medial meniscus tear of left knee 12/20/2019   Breast cancer, left ([place]) 01/09/2016   Genetic testing 54/36/0677   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   Family history of breast cancer in female 11/08/2015   Family history of colon cancer 11/08/2015   Malignant neoplasm of central portion of left breast in female, estrogen receptor positive ([place]) 10/31/2015   Endometriosis 03/11/2011    PCP: Maurice Small, MD  REFERRING PROVIDER: Nicholas Lose, MD   REFERRING DIAG: M79.89 (ICD-10-CM) - Right leg swelling   THERAPY DIAG:  Localized edema  History of total right knee replacement  Difficulty in walking, not elsewhere classified  Muscle weakness (generalized)  Chronic pain of right knee  Stiffness of right knee, not elsewhere classified  ONSET DATE: 03/31/22  Rationale for Evaluation and Treatment Rehabilitation  SUBJECTIVE:                                                                                                                                                                                           SUBJECTIVE STATEMENT: The Doctor said it was just an episode of swelling.  I  got my stocking  PERTINENT HISTORY:  Right lower extremity swelling/ lymphedema with known right lower extremity  DVT 05/12/22 and hx of right total knee 03/31/22, L breast cancer in 2017 with prophylactic bilateral mastectomy PAIN:  Are you having pain? Yes NPRS scale: 0/10 at rest 5/10 with walking    PRECAUTIONS: Other: R TKA 03/31/22  WEIGHT BEARING RESTRICTIONS: No  FALLS:  Has patient fallen in last 6 months? 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  LEISURE: pt does not exercise  HAND DOMINANCE: right   PRIOR LEVEL OF FUNCTION: Independent  PATIENT GOALS: to walk without a cane, to walk where it does not hurt as much   OBJECTIVE:  COGNITION: Overall cognitive status: Within functional limits for tasks assessed   PALPATION: Fibrosis throughout R LE, pitting edema  OBSERVATIONS / OTHER ASSESSMENTS: RLE from knee down approx twice the size of left    LOWER EXTREMITY [facility] RIGHT eval 07/04/22 07/07/22 07/11/22 07/18/22 07/30/22 08/08/22 08/13/22  At groin          30 cm proximal to suprapatella          20 cm proximal to suprapatella 55 55 55 55.4 57.2 56.4 55   10 cm proximal to suprapatella 48 47 47.5 46.6 48.2 48.4 47 46  At midpatella / popliteal crease 43.9 44 43 42 42.6 42.6 42.6 43  30 cm proximal to floor at lateral plantar foot 39.8 37.5 39.6 36.5 34.9 37.5 39 40  20 cm proximal to floor at lateral plantar foot 31.9 31 30.7 28.6 26.9 29.2 30 31.5  10 cm proximal to floor at lateral plantar foot 26.5 24.7 25 22.6 23.1 23.9 24.5   Circumference of ankle/heel          5 cm proximal to 1st MTP joint 22._0 21.9 22.5 23.4 22.8   Across MTP joint 23.1 23 22.2 22 22.3 22.4 22.4 22.5  Around  proximal great toe 8.6 8.6 8.4 8.4 8.3 8.8 8.3 8.5  (Blank rows = not tested)  LOWER EXTREMITY [facility] LEFT eval  At groin   30 cm proximal to suprapatella   20 cm proximal to suprapatella 56.2  10 cm proximal to suprapatella 47.5  At midpatella / popliteal crease 40.5  30 cm proximal to floor at lateral plantar foot 36.9  20 cm proximal to floor at lateral plantar foot 26.8  10 cm proximal to floor at lateral plantar foot 21.9  Circumference of ankle/heel   5 cm proximal to 1st MTP joint 22.1  Across MTP joint 22.2  Around proximal great toe 8.3  (Blank rows = not tested)  TODAY'S TREATMENT:  DATE:  08/13/22: Pt arrives with new stocking.  Education on donning and care.  Stocking seems to fit very well and pt is able to donn without assistance.   Education on self MLD with all steps performed in seated with leg propped on table.  PT performed and read all steps and then pt performed with cueing as needed.   Handout given per instruction section  Nustep x 17mn level 3 UE/LE for knee ROM and lymphatic movement In parallel bars;  Heel raise x 10  Slow march x 10  Pre-gait steps x 5 bil Work on heel toe gait in clinic.   08/11/22: see assessment no tx performed today  08/08/22: MLD: in supine with HOB elevated as follows: short neck, superficial and deep abdominals, Rt inguinal nodes and then R LE working proximal to distal, intact sequence, moving fluid towards inguinal nodes. Wrapped to knee high due to increase in size tg soft inside out, toes 1-4, artiflex to the knee, 8cm roman sandal and 2 10cm bandages from foot to knee Reviewed donning instructions  08/04/22: Manual Therapy MLD: in supine with HOB elevated as follows: short neck, superficial and deep abdominals, Rt inguinal nodes and then R LE working proximal to distal, intact sequence, moving fluid  towards inguinal nodes. Educated pt to bring her bandages in next session so we can wrap her to reduce leg prior to her compression garments arriving.   07/30/22: Manual Therapy MLD: in supine with HOB elevated as follows: short neck, superficial and deep abdominals, Rt inguinal nodes and then R LE working proximal to distal, intact sequence, moving fluid towards inguinal nodes.  07/28/22: MLD: in supine with HOB elevated as follows: short neck, superficial and deep abdominals, Rt inguinal nodes and then R LE working proximal to distal, intact sequence, moving fluid towards inguinal nodes. Compression bandaging to RLE in sitting: Cocoa butter applied, NEW TG soft from foot to mid thigh, elastomull to digits 1-4, artiflex around foot/ankle, rosidal from just below knee to mid thigh, 1 6cm bandage at foot, 1 8cm bandage at ankle, 1 12 cm from foot to knee, 1 12 cm from lower leg to mid thigh with spiral, 1 12 cm from lower leg to mid thigh  07/25/2022 MLD: in supine with HOB elevated as follows: short neck, No abdominals due to earlier stomach virus, Rt inguinal nodes and then R LE working proximal to distal, intact sequence, moving fluid towards inguinal nodes. Compression bandaging to RLE in sitting: Cocoa butter applied, NEW TG soft from foot to mid thigh, elastomull to digits 1-5, artiflex around foot/ankle, rosidal from just below knee to mid thigh, 1 6cm bandage at foot, 1 8cm bandage at ankle, 1 12 cm from foot to knee, 1 12 cm from lower leg to mid thigh with spiral, 1 12 cm from lower leg to mid thigh   07/18/22 Manual Therapy MLD: in supine with HOB elevated as follows: short neck, superficial and deep abdominals, Rt inguinal nodes and then R LE working proximal to distal, intact sequence, moving fluid towards inguinal nodes.  Compression bandaging to RLE in supine: Cocoa butter applied, TG soft from foot to mid thigh, elastomull to digits 1-4, artiflex around foot/ankle, rosidal from just  below knee to mid thigh, 1 6cm bandage at foot, 1 8cm bandage at ankle, 1 12 cm from foot to knee, 1 12 cm from lower leg to mid thigh with spiral, 1 12 cm from lower leg to mid thigh  07/16/22 Manual Therapy  MLD: in supine with HOB elevated as follows: short neck, superficial and deep abdominals, Rt inguinal nodes and then R LE working proximal to distal, intact sequence, moving fluid towards inguinal nodes.  Compression bandaging to RLE in supine: Cocoa butter applied,  TG soft from foot to mid thigh, elastomull to digits 1-4, artiflex around foot/ankle, rosidal from just below knee to mid thigh, 1 6cm bandage at foot, 1 8cm bandage at ankle, 1 12 cm from foot to knee, 1 12 cm from lower leg to mid thigh with spiral, 1 12 cm from lower leg to mid thigh  07/14/22 Orthotic Fit At beginning of session measured pt for a [employer] and she fits well into the size of Large and tall for length. She would like to see if her insurance coverage assessed so will fax her demographics to one of our DME companies to verify her coverage, per pt request.  Manual Therapy MLD: in supine with HOB elevated as follows: short neck, superficial and deep abdominals, Rt inguinal nodes and then R LE working proximal to distal, intact sequence, moving fluid towards inguinal nodes.  Compression bandaging to RLE in supine: Cocoa butter applied, TG soft from foot to mid thigh, elastomull to digits 1-4, artiflex around foot/ankle, rosidal from just below knee to mid thigh, 1 6cm bandage at foot, 1 8cm bandage at ankle, 1 12 cm from foot to knee, 1 12 cm from lower leg to mid thigh with spiral, 1 12 cm from lower leg to mid thigh   PATIENT EDUCATION:  Education details: causes of edema, how a DVT can lead to prolonged edema in combination with recent surgery, need for bandaging and then eventually compression garments Person educated: Patient Education method: Explanation Education comprehension: verbalized  understanding  HOME EXERCISE PROGRAM: Wear compression bandaging 24/7 and remove if pain or numbness that does not go away when you move  ASSESSMENT:  CLINICAL IMPRESSION: Pt met with MD and also has a lot less pain today.  Pt received stocking which fits well.  Education on self MLD for the LE and started TE for more knee ROM and stiffness.  Pt is interested in extending visits for general knee therapy as she did not finish this and she now has her lymphedema stockings.    OBJECTIVE IMPAIRMENTS: decreased knowledge of condition, decreased knowledge of use of DME, increased edema, and pain.   ACTIVITY LIMITATIONS: standing and standing, walking  PARTICIPATION LIMITATIONS: occupation  PERSONAL FACTORS:  none  are also affecting patient's functional outcome.   REHAB POTENTIAL: Excellent  CLINICAL DECISION MAKING: Stable/uncomplicated  EVALUATION COMPLEXITY: Low  GOALS: Goals reviewed with patient? Yes  SHORT TERM GOALS=LONG TERM GOALS Target date: 09/10/2022    Pt will demonstrate a 4 cm decrease in edema 10 cm proximal to lateral malleoli to improve comfort.  Baseline: 26.5cm  Goal status: INITIAL  2.  Pt will obtain appropriate compression garments for long term management of edema.  Baseline:  Goal status: INITIAL  3.  Pt will be independent in self MLD for long term management of edema.  Baseline:  Goal status: INITIAL  4.  Pt will report a 75% improvement in discomfort and pain in RLE to allow improved ability to walk and stand.  Baseline:  Goal status: INITIAL  PLAN:  PT FREQUENCY: 3x/week  PT DURATION: 4 weeks  PLANNED INTERVENTIONS: Therapeutic exercises, Therapeutic activity, Patient/Family education, Self Care, Orthotic/Fit training, Manual lymph drainage, Compression bandaging, Taping, Vasopneumatic device, and Manual therapy  PLAN FOR NEXT SESSION:  How was MLD? How was stocking? Schedule visits for ortho - doesn't want to do pool    Stark Bray,  PT 08/13/2022, 10:47 AM

## 2022-08-13 NOTE — Patient Instructions (Signed)
Deep Effective Breath   1.)Standing, sitting, or laying down place both hands on the belly. Take a deep breath IN, expanding the belly; then breath OUT, contracting the belly. Repeat __5__ times. Do __2-3__ sessions per day and before each self massage.  2.) 5-10 circles in the Rt going over the lymph nodes    3.) Pump up outer thigh of involved leg from knee to outer hip.   4.) Then do stationary circles from inner to outer thigh, then do outer thigh again.   5.) Next, interlace fingers behind knee IF ABLE and make in-place circles. Do _5_ times  6.) Work on the front of the knee      7.) Hands on sides of ankle of involved leg, pump _5__ times up both sides of lower leg,   8.) Then retrace steps up outer thigh to hip as before and back to pathway. Do _2-3_ times.    FOOT: Dorsum of Foot and Toes Massage   9.) One hand on top of foot make _5_ stationary circles or pumps, then either on top of toes or each individual toe do _5_ pumps. Then retrace all steps pumping back up both sides of lower leg, outer thigh, and then pathway. Finish with what you started with, _5_ circles at involved side arm pit. All _2-3_ times at each sequence. Do _1__ time per day.  Copyright  VHI. All rights reserved.

## 2022-08-14 ENCOUNTER — Ambulatory Visit: Payer: Commercial Managed Care - HMO | Admitting: Orthopaedic Surgery

## 2022-08-15 ENCOUNTER — Ambulatory Visit: Payer: Commercial Managed Care - HMO | Admitting: Rehabilitation

## 2022-08-15 ENCOUNTER — Encounter: Payer: Self-pay | Admitting: Rehabilitation

## 2022-08-15 DIAGNOSIS — M6281 Muscle weakness (generalized): Secondary | ICD-10-CM

## 2022-08-15 DIAGNOSIS — G8929 Other chronic pain: Secondary | ICD-10-CM

## 2022-08-15 DIAGNOSIS — Z96651 Presence of right artificial knee joint: Secondary | ICD-10-CM

## 2022-08-15 DIAGNOSIS — R600 Localized edema: Secondary | ICD-10-CM | POA: Diagnosis not present

## 2022-08-15 DIAGNOSIS — R262 Difficulty in walking, not elsewhere classified: Secondary | ICD-10-CM

## 2022-08-15 DIAGNOSIS — M25661 Stiffness of right knee, not elsewhere classified: Secondary | ICD-10-CM

## 2022-08-15 NOTE — Therapy (Signed)
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,  multifocal IDC, DCIS, ER/PR+,  01-09-2016 s/p bilteral mastectomies w/ left sln bx;  no chemoradiation   Cataracts, bilateral    Depression    Diabetes mellitus without complication ([place])    type 2   GAD (generalized anxiety disorder)    Gallbladder problem    History of ovarian cyst    IBS (irritable bowel syndrome)    Joint pain    PONV (postoperative nausea and vomiting)    does well with scop patch   Retinal detachment    Rheg OS   Right knee meniscal tear    Urgency of urination    Past Surgical History:  Procedure Laterality Date   ABDOMINAL HYSTERECTOMY  05/1996   endometriosis   ACHILLES TENDON REPAIR Right 2010;  revision 2011   BLADDER SUSPENSION  2000   BREAST BIOPSY Left 10/2015   BREAST IMPLANT REMOVAL Bilateral 08/12/2017   Procedure: REMOVAL BILATERAL BREAST IMPLANTS;  Surgeon: Wallace Going, DO;  Location: [place];  Service: Plastics;  Laterality: Bilateral;   BREAST RECONSTRUCTION WITH PLACEMENT OF TISSUE EXPANDER AND FLEX HD (ACELLULAR HYDRATED DERMIS) Bilateral 01/09/2016   BREAST RECONSTRUCTION WITH PLACEMENT OF TISSUE EXPANDER AND FLEX HD (ACELLULAR HYDRATED DERMIS) Bilateral 01/09/2016   Procedure: BREAST RECONSTRUCTION WITH PLACEMENT OF TISSUE EXPANDER AND FLEX HD (ACELLULAR  HYDRATED DERMIS);  Surgeon: Loel Lofty Dillingham, DO;  Location: [place];  Service: Plastics;  Laterality: Bilateral;   BREAST RECONSTRUCTION WITH PLACEMENT OF TISSUE EXPANDER AND FLEX HD (ACELLULAR HYDRATED DERMIS) Bilateral 05/29/2016   Procedure: PLACEMENT OF BILATERAL TISSUE EXPANDER AND FLEX HD (ACELLULAR HYDRATED DERMIS);  Surgeon: Wallace Going, DO;  Location: [place];  Service: Plastics;  Laterality: Bilateral;   BREAST REDUCTION SURGERY Bilateral 11/26/2016   Procedure: BILATERAL BREAST CAPSULE CONTRACTURE RELASE;  Surgeon: Wallace Going, DO;  Location: [place];  Service: Plastics;  Laterality: Bilateral;   [place]; 1989; [place] Right x3   last one 02-01-2019 @ [facility]   EYE SURGERY Left 10/01/2020   Pneumatic retinopexy for repair of rheg RD - Dr. Bernarda Caffey   EYE SURGERY Left 10/04/2020   PPV - Dr. Bernarda Caffey   FAT GRAFTING BILATERAL BREAST  08-09-2018  _0    GAS INSERTION Left 10/04/2020   Procedure: INSERTION OF GAS;  Surgeon: Bernarda Caffey, MD;  Location: [place];  Service: Ophthalmology;  Laterality: Left;   GAS/FLUID EXCHANGE Left 10/04/2020   Procedure: GAS/FLUID EXCHANGE;  Surgeon: Bernarda Caffey, MD;  Location: [place];  Service: Ophthalmology;  Laterality: Left;   INCISION AND DRAINAGE OF WOUND Bilateral 02/11/2016   Procedure:  IRRIGATION AND DEBRIDEMENT OF BILATERAL BREAST POCKET;  Surgeon: Wallace Going, DO;  Location: [place];  Service: Plastics;  Laterality: Bilateral;   KNEE ARTHROSCOPY WITH MEDIAL MENISECTOMY Right 12/20/2019   Procedure: KNEE ARTHROSCOPY WITH MEDIAL MENISECTOMY;  Surgeon: Marchia Bond, MD;  Location: [place];  Service: Orthopedics;  Laterality: Right;   KNEE ARTHROSCOPY WITH MEDIAL MENISECTOMY Right 10/30/2021   Procedure: RIGHT KNEE ARTHROSCOPY WITH PARTIAL MEDIAL MENISECTOMY SYNOVECTOMY;  Surgeon: Leandrew Koyanagi, MD;  Location: [place];  Service: Orthopedics;  Laterality: Right;   LAPAROSCOPIC APPENDECTOMY  04-07-2011   _0    w/ Excision peritoneal lipoma and lysis adhesions   LAPAROSCOPIC CHOLECYSTECTOMY  ~ [place] Right 10/04/2020   Procedure: LASER RETINOPEXY WITH INDIRECT LASER OPTHALMOSCOPE, RIGHT EYE;  Surgeon: Bernarda Caffey, MD;  Location: [place];  Service: Ophthalmology;  Laterality: Right;   LIPOSUCTION WITH LIPOFILLING Bilateral 11/26/2016   Procedure: LIPOFILLING FOR SYMMETRY;  Surgeon: Wallace Going, DO;  Location: [place];  Service: Plastics;  Laterality: Bilateral;   LIPOSUCTION WITH LIPOFILLING Bilateral 01/21/2017   Procedure: BILATERAL BREAST  LIPOFILLING FOR ASYMMETRY;  Surgeon: Wallace Going, DO;  Location: [place];  Service: Plastics;  Laterality: Bilateral;   LIPOSUCTION WITH LIPOFILLING Bilateral 06/28/2020   Procedure: Lipofilling bilateral breasts for asymmetry;  Surgeon: Wallace Going, DO;  Location: [place];  Service: Plastics;  Laterality: Bilateral;  90 min   MASTECTOMY Bilateral 01/09/2016   NIPPLE SPARING MASTECTOMY/SENTINAL LYMPH NODE BIOPSY/RECONSTRUCTION/PLACEMENT OF TISSUE EXPANDER Bilateral 01/09/2016   Procedure: BILATERAL NIPPLE SPARING MASTECTOMY WITH LEFT SENTINAL LYMPH NODE BIOPSY ;  Surgeon: Stark Klein, MD;  Location: [place];  Service: General;  Laterality: Bilateral;   PHOTOCOAGULATION WITH LASER Left 10/04/2020   Procedure: PHOTOCOAGULATION WITH LASER;  Surgeon: Bernarda Caffey, MD;  Location: [place];  Service: Ophthalmology;  Laterality: Left;   REMOVAL OF BILATERAL TISSUE EXPANDERS WITH PLACEMENT OF BILATERAL BREAST IMPLANTS Bilateral 08/20/2016   Procedure: REMOVAL OF BILATERAL TISSUE EXPANDERS WITH PLACEMENT OF BILATERAL SILICONE IMPLANTS;  Surgeon: Wallace Going, DO;  Location: [place];  Service: Plastics;  Laterality: Bilateral;   REMOVAL OF BILATERAL TISSUE  EXPANDERS WITH PLACEMENT OF BILATERAL BREAST IMPLANTS Bilateral 11/05/2017   Procedure: REMOVAL OF BILATERAL TISSUE EXPANDERS WITH PLACEMENT OF BILATERAL BREAST SILICONE IMPLANTS;  Surgeon: Wallace Going, DO;  Location: [place];  Service: Plastics;  Laterality: Bilateral;   REMOVAL OF TISSUE EXPANDER Bilateral 02/11/2016   Procedure: REMOVAL OF BILATERAL TISSUE EXPANDERS AND FLEX HD REMOVAL;  Surgeon: Wallace Going, DO;  Location: [place];  Service: Plastics;  Laterality: Bilateral;   RETINAL DETACHMENT SURGERY Left 10/01/2020   Pneumatic retinopexy for repair of rheg RD - Dr. Bernarda Caffey   RETINAL DETACHMENT SURGERY Left 10/04/2020   PPV - Dr. Bernarda Caffey   SCLERAL BUCKLE Left 10/04/2020   Procedure: SCLERAL BUCKLE LEFT EYE;  Surgeon: Bernarda Caffey, MD;  Location: [place];  Service: Ophthalmology;  Laterality: Left;   TISSUE EXPANDER PLACEMENT Bilateral 08/12/2017   Procedure: PLACEMENT OF BILATERAL TISSUE EXPANDER;  Surgeon: Wallace Going, DO;  Location: [place];  Service: Plastics;  Laterality: Bilateral;   TOTAL KNEE ARTHROPLASTY Right 03/31/2022   Procedure: RIGHT TOTAL KNEE REPLACEMENT;  Surgeon: Leandrew Koyanagi, MD;  Location: [place];  Service: Orthopedics;  Laterality: Right;   TUBAL LIGATION Bilateral 1991   VITRECTOMY  [place] BUCKLE Left 10/04/2020   Procedure: [place] VITRECTOMY LEFT EYE ;  Surgeon: Bernarda Caffey, MD;  Location: [place];  Service: Ophthalmology;  Laterality: Left;   Patient Active Problem List   Diagnosis Date Noted   DVT (deep venous thrombosis) ([place]) 05/11/2022   Acute pulmonary embolism ([place]) 05/09/2022   Chest pain 05/09/2022   Elevated troponin 05/09/2022   Status post total right knee replacement 03/31/2022   Primary osteoarthritis of right knee 03/20/2022   Eating disorder 03/20/2022   Acute medial meniscus tear, right, initial encounter 10/01/2021   At risk for  dehydration 01/28/2021   At risk for malnutrition 01/01/2021   Obesity, Class I, BMI 30-34.9 12/31/2020   Vitamin B12 deficiency 10/08/2020   At risk for depression 10/08/2020   Mood disorder ([place]), with emotional eating 09/24/2020   At risk for impaired metabolic function 45/62/5638   At risk for diabetes mellitus 07/30/2020   Vitamin D deficiency 07/02/2020   Depression 07/02/2020   At risk for side effect of medication 07/02/2020   Stress due to illness of family member-  daughter with drug addiction 05/15/2020   Depression, recurrent ([place]) 05/15/2020   Anemia 05/15/2020   Constipation 05/15/2020   Prediabetes 05/15/2020   Breast asymmetry following reconstructive surgery 04/24/2020   Acquired absence of breast 04/24/2020   S/P breast reconstruction, bilateral 04/24/2020   Acute medial meniscus tear of left knee 12/20/2019   Breast cancer, left ([place]) 01/09/2016   Genetic testing 93/73/4287   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   Family history of breast cancer in female 11/08/2015   Family history of colon cancer 11/08/2015   Malignant neoplasm of central portion of left breast in female, estrogen receptor positive ([place]) 10/31/2015   Endometriosis 03/11/2011    PCP: Maurice Small, MD  REFERRING PROVIDER: Nicholas Lose, MD   REFERRING DIAG: M79.89 (ICD-10-CM) - Right leg swelling   THERAPY DIAG:  Localized edema  History of total right knee replacement  Difficulty in walking, not elsewhere classified  Muscle weakness (generalized)  Chronic pain of right knee  Stiffness of right knee, not elsewhere classified  ONSET DATE: 03/31/22  Rationale for Evaluation and Treatment Rehabilitation  SUBJECTIVE:                                                                                                                                                                                           SUBJECTIVE STATEMENT: I think my stocking is working well.    PERTINENT  HISTORY:  Right lower extremity swelling/ lymphedema with known right lower extremity DVT 05/12/22 and hx of right  total knee 03/31/22, L breast cancer in 2017 with prophylactic bilateral mastectomy PAIN:  Are you having pain? Yes NPRS scale: 0/10   PRECAUTIONS: Other: R TKA 03/31/22  WEIGHT BEARING RESTRICTIONS: No  FALLS:  Has patient fallen in last 6 months? 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  LEISURE: pt does not exercise  HAND DOMINANCE: right   PRIOR LEVEL OF FUNCTION: Independent  PATIENT GOALS: to walk without a cane, to walk where it does not hurt as much   OBJECTIVE:  COGNITION: Overall cognitive status: Within functional limits for tasks assessed   PALPATION: Fibrosis throughout R LE, pitting edema  OBSERVATIONS / OTHER ASSESSMENTS: RLE from knee down approx twice the size of left    LOWER EXTREMITY [facility] RIGHT eval 07/04/22 07/07/22 07/11/22 07/18/22 07/30/22 08/08/22 08/13/22  At groin          30 cm proximal to suprapatella          20 cm proximal to suprapatella 55 55 55 55.4 57.2 56.4 55   10 cm proximal to suprapatella 48 47 47.5 46.6 48.2 48.4 47 46  At midpatella / popliteal crease 43.9 44 43 42 42.6 42.6 42.6 43  30 cm proximal to floor at lateral plantar foot 39.8 37.5 39.6 36.5 34.9 37.5 39 40  20 cm proximal to floor at lateral plantar foot 31.9 31 30.7 28.6 26.9 29.2 30 31.5  10 cm proximal to floor at lateral plantar foot 26.5 24.7 25 22.6 23.1 23.9 24.5   Circumference of ankle/heel          5 cm proximal to 1st MTP joint 22._0 21.9 22.5 23.4 22.8   Across MTP joint 23.1 23 22.2 22 22.3 22.4 22.4 22.5  Around proximal great toe 8.6 8.6 8.4 8.4 8.3 8.8 8.3 8.5   (Blank rows = not tested)  LOWER EXTREMITY [facility] LEFT eval  At groin   30 cm proximal to suprapatella   20 cm proximal to suprapatella 56.2  10 cm proximal to suprapatella 47.5  At midpatella / popliteal crease 40.5  30 cm proximal to floor at lateral plantar foot 36.9  20 cm proximal to floor at lateral plantar foot 26.8  10 cm proximal to floor at lateral plantar foot 21.9  Circumference of ankle/heel   5 cm proximal to 1st MTP joint 22.1  Across MTP joint 22.2  Around proximal great toe 8.3  (Blank rows = not tested)  TODAY'S TREATMENT:                                                                                                                           DATE:  08/15/22: MLD: in supine with HOB elevated as follows:  short neck, superficial and deep abdominals, Rt inguinal nodes and then R LE working proximal to distal, intact sequence, moving fluid towards inguinal nodes. Donned thigh high stocking Nustep x 51mn level 3 UE/LE for knee ROM and lymphatic movement - seat 9, arms 10 In parallel bars;  Heel raise x 10  Slow march x 10 on blue foam  Step up 4" x 10 Rt lead with vcs for full extension  Tandem stance x 20" bil Work on heel toe gait in clinic.   08/13/22: Pt arrives with new stocking.  Education on donning and care.  Stocking seems to fit very well and pt is able to donn without assistance.   Education on self MLD with all steps performed in seated with leg propped on table.  PT performed and read all steps and then pt performed with cueing as needed.   Handout given per instruction section  Nustep x 864m level 3 UE/LE for knee ROM and lymphatic movement In parallel bars;  Heel raise x 10  Slow march x 10  Pre-gait steps x 5 bil Work on heel toe gait in clinic.   08/11/22: see assessment no tx performed today  08/08/22: MLD: in supine with HOB elevated as follows: short neck, superficial and deep abdominals, Rt inguinal nodes and then R LE working proximal to  distal, intact sequence, moving fluid towards inguinal nodes. Wrapped to knee high due to increase in size tg soft inside out, toes 1-4, artiflex to the knee, 8cm roman sandal and 2 10cm bandages from foot to knee Reviewed donning instructions  08/04/22: Manual Therapy MLD: in supine with HOB elevated as follows: short neck, superficial and deep abdominals, Rt inguinal nodes and then R LE working proximal to distal, intact sequence, moving fluid towards inguinal nodes. Educated pt to bring her bandages in next session so we can wrap her to reduce leg prior to her compression garments arriving.   07/30/22: Manual Therapy MLD: in supine with HOB elevated as follows: short neck, superficial and deep abdominals, Rt inguinal nodes and then R LE working proximal to distal, intact sequence, moving fluid towards inguinal nodes.  07/28/22: MLD: in supine with HOB elevated as follows: short neck, superficial and deep abdominals, Rt inguinal nodes and then R LE working proximal to distal, intact sequence, moving fluid towards inguinal nodes. Compression bandaging to RLE in sitting: Cocoa butter applied, NEW TG soft from foot to mid thigh, elastomull to digits 1-4, artiflex around foot/ankle, rosidal from just below knee to mid thigh, 1 6cm bandage at foot, 1 8cm bandage at ankle, 1 12 cm from foot to knee, 1 12 cm from lower leg to mid thigh with spiral, 1 12 cm from lower leg to mid thigh  07/25/2022 MLD: in supine with HOB elevated as follows: short neck, No abdominals due to earlier stomach virus, Rt inguinal nodes and then R LE working proximal to distal, intact sequence, moving fluid towards inguinal nodes. Compression bandaging to RLE in sitting: Cocoa butter applied, NEW TG soft from foot to mid thigh, elastomull to digits 1-5, artiflex around foot/ankle, rosidal from just below knee to mid thigh, 1 6cm bandage at foot, 1 8cm bandage at ankle, 1 12 cm from foot to knee, 1 12 cm from lower leg to mid  thigh with spiral, 1 12 cm from lower leg to mid thigh   07/18/22 Manual Therapy MLD: in supine with HOB elevated as follows: short neck, superficial and deep abdominals, Rt inguinal nodes and  then R LE working proximal to distal, intact sequence, moving fluid towards inguinal nodes.  Compression bandaging to RLE in supine: Cocoa butter applied, TG soft from foot to mid thigh, elastomull to digits 1-4, artiflex around foot/ankle, rosidal from just below knee to mid thigh, 1 6cm bandage at foot, 1 8cm bandage at ankle, 1 12 cm from foot to knee, 1 12 cm from lower leg to mid thigh with spiral, 1 12 cm from lower leg to mid thigh  07/16/22 Manual Therapy MLD: in supine with HOB elevated as follows: short neck, superficial and deep abdominals, Rt inguinal nodes and then R LE working proximal to distal, intact sequence, moving fluid towards inguinal nodes.  Compression bandaging to RLE in supine: Cocoa butter applied,  TG soft from foot to mid thigh, elastomull to digits 1-4, artiflex around foot/ankle, rosidal from just below knee to mid thigh, 1 6cm bandage at foot, 1 8cm bandage at ankle, 1 12 cm from foot to knee, 1 12 cm from lower leg to mid thigh with spiral, 1 12 cm from lower leg to mid thigh  07/14/22 Orthotic Fit At beginning of session measured pt for a [employer] and she fits well into the size of Large and tall for length. She would like to see if her insurance coverage assessed so will fax her demographics to one of our DME companies to verify her coverage, per pt request.  Manual Therapy MLD: in supine with HOB elevated as follows: short neck, superficial and deep abdominals, Rt inguinal nodes and then R LE working proximal to distal, intact sequence, moving fluid towards inguinal nodes.  Compression bandaging to RLE in supine: Cocoa butter applied, TG soft from foot to mid thigh, elastomull to digits 1-4, artiflex around foot/ankle, rosidal from just below knee to mid thigh, 1  6cm bandage at foot, 1 8cm bandage at ankle, 1 12 cm from foot to knee, 1 12 cm from lower leg to mid thigh with spiral, 1 12 cm from lower leg to mid thigh   PATIENT EDUCATION:  Education details: causes of edema, how a DVT can lead to prolonged edema in combination with recent surgery, need for bandaging and then eventually compression garments Person educated: Patient Education method: Explanation Education comprehension: verbalized understanding  HOME EXERCISE PROGRAM: Wear compression bandaging 24/7 and remove if pain or numbness that does not go away when you move  ASSESSMENT:  CLINICAL IMPRESSION: Included more TKR rehab including MLD for mostly below knee edema continued.     OBJECTIVE IMPAIRMENTS: decreased knowledge of condition, decreased knowledge of use of DME, increased edema, and pain.   ACTIVITY LIMITATIONS: standing and standing, walking  PARTICIPATION LIMITATIONS: occupation  PERSONAL FACTORS:  none  are also affecting patient's functional outcome.   REHAB POTENTIAL: Excellent  CLINICAL DECISION MAKING: Stable/uncomplicated  EVALUATION COMPLEXITY: Low  GOALS: Goals reviewed with patient? Yes  SHORT TERM GOALS=LONG TERM GOALS Target date: 09/12/2022    Pt will demonstrate a 4 cm decrease in edema 10 cm proximal to lateral malleoli to improve comfort.  Baseline: 26.5cm  Goal status: INITIAL  2.  Pt will obtain appropriate compression garments for long term management of edema.  Baseline:  Goal status: INITIAL  3.  Pt will be independent in self MLD for long term management of edema.  Baseline:  Goal status: INITIAL  4.  Pt will report a 75% improvement in discomfort and pain in RLE to allow improved ability to walk and stand.  Baseline:  Goal status: INITIAL  PLAN:  PT FREQUENCY: 3x/week  PT DURATION: 4 weeks  PLANNED INTERVENTIONS: Therapeutic exercises, Therapeutic activity, Patient/Family education, Self Care, Orthotic/Fit training, Manual  lymph drainage, Compression bandaging, Taping, Vasopneumatic device, and Manual therapy  PLAN FOR NEXT SESSION:  How was MLD? How was stocking? Schedule visits for ortho - doesn't want to do pool    Stark Bray, PT 08/15/2022, 9:56 AM

## 2022-08-19 ENCOUNTER — Ambulatory Visit: Payer: Commercial Managed Care - HMO

## 2022-08-19 DIAGNOSIS — G8929 Other chronic pain: Secondary | ICD-10-CM

## 2022-08-19 DIAGNOSIS — M6281 Muscle weakness (generalized): Secondary | ICD-10-CM

## 2022-08-19 DIAGNOSIS — R600 Localized edema: Secondary | ICD-10-CM | POA: Diagnosis not present

## 2022-08-19 DIAGNOSIS — M25661 Stiffness of right knee, not elsewhere classified: Secondary | ICD-10-CM

## 2022-08-19 DIAGNOSIS — R262 Difficulty in walking, not elsewhere classified: Secondary | ICD-10-CM

## 2022-08-19 DIAGNOSIS — Z96651 Presence of right artificial knee joint: Secondary | ICD-10-CM

## 2022-08-19 NOTE — Therapy (Signed)
OUTPATIENT PHYSICAL THERAPY ONCOLOGY TREATMENT  Patient Name: Christina [name] MRN: [id number] DOB:12/30/1965, 56 y.o., female Today's Date: 08/19/2022   PT End of Session - 08/19/22 0816     Visit Number 17    Number of Visits 20    Date for PT Re-Evaluation 09/01/22    PT Start Time 0912   pt arrived late   PT Stop Time 0959    PT Time Calculation (min) 47 min    Activity Tolerance Patient tolerated treatment well    Behavior During Therapy [facility] for tasks assessed/performed                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,  multifocal IDC, DCIS, ER/PR+,  01-09-2016 s/p bilteral mastectomies w/ left sln bx;  no chemoradiation   Cataracts, bilateral    Depression    Diabetes mellitus without complication ([place])    type 2   GAD (generalized anxiety disorder)    Gallbladder problem    History of ovarian cyst    IBS (irritable bowel syndrome)    Joint pain    PONV (postoperative nausea and vomiting)    does well with scop patch   Retinal detachment    Rheg OS   Right knee meniscal tear    Urgency of urination    Past Surgical History:  Procedure Laterality Date   ABDOMINAL HYSTERECTOMY  05/1996   endometriosis   ACHILLES TENDON REPAIR Right 2010;  revision 2011   BLADDER SUSPENSION  2000   BREAST BIOPSY Left 10/2015   BREAST IMPLANT REMOVAL Bilateral 08/12/2017   Procedure: REMOVAL BILATERAL BREAST IMPLANTS;  Surgeon: Wallace Going, DO;  Location: [place];  Service: Plastics;  Laterality: Bilateral;   BREAST RECONSTRUCTION WITH PLACEMENT OF TISSUE EXPANDER AND FLEX HD (ACELLULAR HYDRATED DERMIS) Bilateral 01/09/2016   BREAST RECONSTRUCTION WITH PLACEMENT OF TISSUE EXPANDER AND FLEX HD (ACELLULAR HYDRATED DERMIS) Bilateral 01/09/2016   Procedure: BREAST RECONSTRUCTION WITH PLACEMENT OF TISSUE EXPANDER AND FLEX HD  (ACELLULAR HYDRATED DERMIS);  Surgeon: Loel Lofty Dillingham, DO;  Location: [place];  Service: Plastics;  Laterality: Bilateral;   BREAST RECONSTRUCTION WITH PLACEMENT OF TISSUE EXPANDER AND FLEX HD (ACELLULAR HYDRATED DERMIS) Bilateral 05/29/2016   Procedure: PLACEMENT OF BILATERAL TISSUE EXPANDER AND FLEX HD (ACELLULAR HYDRATED DERMIS);  Surgeon: Wallace Going, DO;  Location: [place];  Service: Plastics;  Laterality: Bilateral;   BREAST REDUCTION SURGERY Bilateral 11/26/2016   Procedure: BILATERAL BREAST CAPSULE CONTRACTURE RELASE;  Surgeon: Wallace Going, DO;  Location: [place];  Service: Plastics;  Laterality: Bilateral;   [place]; 1989; [place] Right x3   last one 02-01-2019 @ [facility]   EYE SURGERY Left 10/01/2020   Pneumatic retinopexy for repair of rheg RD - Dr. Bernarda Caffey   EYE SURGERY Left 10/04/2020   PPV - Dr. Bernarda Caffey   FAT GRAFTING BILATERAL BREAST  08-09-2018  _0    GAS INSERTION Left 10/04/2020   Procedure: INSERTION OF GAS;  Surgeon: Bernarda Caffey, MD;  Location: [place];  Service: Ophthalmology;  Laterality: Left;   GAS/FLUID EXCHANGE Left 10/04/2020   Procedure: GAS/FLUID EXCHANGE;  Surgeon: Bernarda Caffey, MD;  Location: [place];  Service: Ophthalmology;  Laterality: Left;   INCISION AND DRAINAGE OF WOUND Bilateral  02/11/2016   Procedure: IRRIGATION AND DEBRIDEMENT OF BILATERAL BREAST POCKET;  Surgeon: Wallace Going, DO;  Location: [place];  Service: Plastics;  Laterality: Bilateral;   KNEE ARTHROSCOPY WITH MEDIAL MENISECTOMY Right 12/20/2019   Procedure: KNEE ARTHROSCOPY WITH MEDIAL MENISECTOMY;  Surgeon: Marchia Bond, MD;  Location: [place];  Service: Orthopedics;  Laterality: Right;   KNEE ARTHROSCOPY WITH MEDIAL MENISECTOMY Right 10/30/2021   Procedure: RIGHT KNEE ARTHROSCOPY WITH PARTIAL MEDIAL MENISECTOMY SYNOVECTOMY;  Surgeon: Leandrew Koyanagi, MD;  Location:  [place];  Service: Orthopedics;  Laterality: Right;   LAPAROSCOPIC APPENDECTOMY  04-07-2011   _0    w/ Excision peritoneal lipoma and lysis adhesions   LAPAROSCOPIC CHOLECYSTECTOMY  ~ [place] Right 10/04/2020   Procedure: LASER RETINOPEXY WITH INDIRECT LASER OPTHALMOSCOPE, RIGHT EYE;  Surgeon: Bernarda Caffey, MD;  Location: [place];  Service: Ophthalmology;  Laterality: Right;   LIPOSUCTION WITH LIPOFILLING Bilateral 11/26/2016   Procedure: LIPOFILLING FOR SYMMETRY;  Surgeon: Wallace Going, DO;  Location: [place];  Service: Plastics;  Laterality: Bilateral;   LIPOSUCTION WITH LIPOFILLING Bilateral 01/21/2017   Procedure: BILATERAL BREAST  LIPOFILLING FOR ASYMMETRY;  Surgeon: Wallace Going, DO;  Location: [place];  Service: Plastics;  Laterality: Bilateral;   LIPOSUCTION WITH LIPOFILLING Bilateral 06/28/2020   Procedure: Lipofilling bilateral breasts for asymmetry;  Surgeon: Wallace Going, DO;  Location: [place];  Service: Plastics;  Laterality: Bilateral;  90 min   MASTECTOMY Bilateral 01/09/2016   NIPPLE SPARING MASTECTOMY/SENTINAL LYMPH NODE BIOPSY/RECONSTRUCTION/PLACEMENT OF TISSUE EXPANDER Bilateral 01/09/2016   Procedure: BILATERAL NIPPLE SPARING MASTECTOMY WITH LEFT SENTINAL LYMPH NODE BIOPSY ;  Surgeon: Stark Klein, MD;  Location: [place];  Service: General;  Laterality: Bilateral;   PHOTOCOAGULATION WITH LASER Left 10/04/2020   Procedure: PHOTOCOAGULATION WITH LASER;  Surgeon: Bernarda Caffey, MD;  Location: [place];  Service: Ophthalmology;  Laterality: Left;   REMOVAL OF BILATERAL TISSUE EXPANDERS WITH PLACEMENT OF BILATERAL BREAST IMPLANTS Bilateral 08/20/2016   Procedure: REMOVAL OF BILATERAL TISSUE EXPANDERS WITH PLACEMENT OF BILATERAL SILICONE IMPLANTS;  Surgeon: Wallace Going, DO;  Location: [place];  Service: Plastics;  Laterality: Bilateral;   REMOVAL OF  BILATERAL TISSUE EXPANDERS WITH PLACEMENT OF BILATERAL BREAST IMPLANTS Bilateral 11/05/2017   Procedure: REMOVAL OF BILATERAL TISSUE EXPANDERS WITH PLACEMENT OF BILATERAL BREAST SILICONE IMPLANTS;  Surgeon: Wallace Going, DO;  Location: [place];  Service: Plastics;  Laterality: Bilateral;   REMOVAL OF TISSUE EXPANDER Bilateral 02/11/2016   Procedure: REMOVAL OF BILATERAL TISSUE EXPANDERS AND FLEX HD REMOVAL;  Surgeon: Wallace Going, DO;  Location: [place];  Service: Plastics;  Laterality: Bilateral;   RETINAL DETACHMENT SURGERY Left 10/01/2020   Pneumatic retinopexy for repair of rheg RD - Dr. Bernarda Caffey   RETINAL DETACHMENT SURGERY Left 10/04/2020   PPV - Dr. Bernarda Caffey   SCLERAL BUCKLE Left 10/04/2020   Procedure: SCLERAL BUCKLE LEFT EYE;  Surgeon: Bernarda Caffey, MD;  Location: [place];  Service: Ophthalmology;  Laterality: Left;   TISSUE EXPANDER PLACEMENT Bilateral 08/12/2017   Procedure: PLACEMENT OF BILATERAL TISSUE EXPANDER;  Surgeon: Wallace Going, DO;  Location: [place];  Service: Plastics;  Laterality: Bilateral;   TOTAL KNEE ARTHROPLASTY Right 03/31/2022   Procedure: RIGHT TOTAL KNEE REPLACEMENT;  Surgeon: Leandrew Koyanagi, MD;  Location: [place];  Service: Orthopedics;  Laterality: Right;   TUBAL LIGATION Bilateral  1991   VITRECTOMY 25 GAUGE WITH SCLERAL BUCKLE Left 10/04/2020   Procedure: 41 GAUGE PARS PLANA VITRECTOMY LEFT EYE ;  Surgeon: Bernarda Caffey, MD;  Location: [place];  Service: Ophthalmology;  Laterality: Left;   Patient Active Problem List   Diagnosis Date Noted   DVT (deep venous thrombosis) ([place]) 05/11/2022   Acute pulmonary embolism ([place]) 05/09/2022   Chest pain 05/09/2022   Elevated troponin 05/09/2022   Status post total right knee replacement 03/31/2022   Primary osteoarthritis of right knee 03/20/2022   Eating disorder 03/20/2022   Acute medial meniscus tear, right, initial encounter 10/01/2021    At risk for dehydration 01/28/2021   At risk for malnutrition 01/01/2021   Obesity, Class I, BMI 30-34.9 12/31/2020   Vitamin B12 deficiency 10/08/2020   At risk for depression 10/08/2020   Mood disorder ([place]), with emotional eating 09/24/2020   At risk for impaired metabolic function 89/38/1017   At risk for diabetes mellitus 07/30/2020   Vitamin D deficiency 07/02/2020   Depression 07/02/2020   At risk for side effect of medication 07/02/2020   Stress due to illness of family member-  daughter with drug addiction 05/15/2020   Depression, recurrent ([place]) 05/15/2020   Anemia 05/15/2020   Constipation 05/15/2020   Prediabetes 05/15/2020   Breast asymmetry following reconstructive surgery 04/24/2020   Acquired absence of breast 04/24/2020   S/P breast reconstruction, bilateral 04/24/2020   Acute medial meniscus tear of left knee 12/20/2019   Breast cancer, left ([place]) 01/09/2016   Genetic testing 51/10/5850   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   Family history of breast cancer in female 11/08/2015   Family history of colon cancer 11/08/2015   Malignant neoplasm of central portion of left breast in female, estrogen receptor positive ([place]) 10/31/2015   Endometriosis 03/11/2011    PCP: Maurice Small, MD  REFERRING PROVIDER: Nicholas Lose, MD   REFERRING DIAG: M79.89 (ICD-10-CM) - Right leg swelling   THERAPY DIAG:  Localized edema  History of total right knee replacement  Difficulty in walking, not elsewhere classified  Muscle weakness (generalized)  Chronic pain of right knee  Stiffness of right knee, not elsewhere classified  ONSET DATE: 03/31/22  Rationale for Evaluation and Treatment Rehabilitation  SUBJECTIVE:                                                                                                                                                                                           SUBJECTIVE STATEMENT: I think my stocking is working well.     PERTINENT HISTORY:  Right lower extremity swelling/ lymphedema with known right lower extremity DVT 05/12/22  and hx of right total knee 03/31/22, L breast cancer in 2017 with prophylactic bilateral mastectomy PAIN:  Are you having pain? Yes NPRS scale: 0/10   PRECAUTIONS: Other: R TKA 03/31/22  WEIGHT BEARING RESTRICTIONS: No  FALLS:  Has patient fallen in last 6 months? 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  LEISURE: pt does not exercise  HAND DOMINANCE: right   PRIOR LEVEL OF FUNCTION: Independent  PATIENT GOALS: to walk without a cane, to walk where it does not hurt as much   OBJECTIVE:  COGNITION: Overall cognitive status: Within functional limits for tasks assessed   PALPATION: Fibrosis throughout R LE, pitting edema  OBSERVATIONS / OTHER ASSESSMENTS: RLE from knee down approx twice the size of left    LOWER EXTREMITY [facility] RIGHT eval 07/04/22 07/07/22 07/11/22 07/18/22 07/30/22 08/08/22 08/13/22  At groin          30 cm proximal to suprapatella          20 cm proximal to suprapatella 55 55 55 55.4 57.2 56.4 55   10 cm proximal to suprapatella 48 47 47.5 46.6 48.2 48.4 47 46  At midpatella / popliteal crease 43.9 44 43 42 42.6 42.6 42.6 43  30 cm proximal to floor at lateral plantar foot 39.8 37.5 39.6 36.5 34.9 37.5 39 40  20 cm proximal to floor at lateral plantar foot 31.9 31 30.7 28.6 26.9 29.2 30 31.5  10 cm proximal to floor at lateral plantar foot 26.5 24.7 25 22.6 23.1 23.9 24.5   Circumference of ankle/heel          5 cm proximal to 1st MTP joint 22._0 21.9 22.5 23.4 22.8   Across MTP joint 23.1 23 22.2 22 22.3 22.4 22.4 22.5  Around proximal great toe 8.6 8.6 8.4 8.4 8.3 8.8  8.3 8.5  (Blank rows = not tested)  LOWER EXTREMITY [facility] LEFT eval  At groin   30 cm proximal to suprapatella   20 cm proximal to suprapatella 56.2  10 cm proximal to suprapatella 47.5  At midpatella / popliteal crease 40.5  30 cm proximal to floor at lateral plantar foot 36.9  20 cm proximal to floor at lateral plantar foot 26.8  10 cm proximal to floor at lateral plantar foot 21.9  Circumference of ankle/heel   5 cm proximal to 1st MTP joint 22.1  Across MTP joint 22.2  Around proximal great toe 8.3  (Blank rows = not tested)  TODAY'S TREATMENT:                                                                                                                           DATE:  08/19/22: Manual Therapy MLD: in  supine with HOB elevated as follows: short neck, superficial and deep abdominals, Rt inguinal nodes and then R LE working proximal to distal, intact sequence, moving fluid towards inguinal nodes. Therapeutic Exercises Nustep x 45mn level 4 UE/LE for knee ROM and lymphatic movement - seat 9, arms 10  08/15/22: MLD: in supine with HOB elevated as follows: short neck, superficial and deep abdominals, Rt inguinal nodes and then R LE working proximal to distal, intact sequence, moving fluid towards inguinal nodes. Donned thigh high stocking Nustep x 865m level 3 UE/LE for knee ROM and lymphatic movement - seat 9, arms 10 In parallel bars;  Heel raise x 10  Slow march x 10 on blue foam  Step up 4" x 10 Rt lead with vcs for full extension  Tandem stance x 20" bil Work on heel toe gait in clinic.   08/13/22: Pt arrives with new stocking.  Education on donning and care.  Stocking seems to fit very well and pt is able to donn without assistance.   Education on self MLD with all steps performed in seated with leg propped on table.  PT performed and read all steps and then pt performed with cueing as needed.   Handout given per instruction section  Nustep x 45m90mlevel 3 UE/LE for  knee ROM and lymphatic movement In parallel bars;  Heel raise x 10  Slow march x 10  Pre-gait steps x 5 bil Work on heel toe gait in clinic.   08/11/22: see assessment no tx performed today  08/08/22: MLD: in supine with HOB elevated as follows: short neck, superficial and deep abdominals, Rt inguinal nodes and then R LE working proximal to distal, intact sequence, moving fluid towards inguinal nodes. Wrapped to knee high due to increase in size tg soft inside out, toes 1-4, artiflex to the knee, 8cm roman sandal and 2 10cm bandages from foot to knee Reviewed donning instructions  08/04/22: Manual Therapy MLD: in supine with HOB elevated as follows: short neck, superficial and deep abdominals, Rt inguinal nodes and then R LE working proximal to distal, intact sequence, moving fluid towards inguinal nodes. Educated pt to bring her bandages in next session so we can wrap her to reduce leg prior to her compression garments arriving.   07/30/22: Manual Therapy MLD: in supine with HOB elevated as follows: short neck, superficial and deep abdominals, Rt inguinal nodes and then R LE working proximal to distal, intact sequence, moving fluid towards inguinal nodes.  07/28/22: MLD: in supine with HOB elevated as follows: short neck, superficial and deep abdominals, Rt inguinal nodes and then R LE working proximal to distal, intact sequence, moving fluid towards inguinal nodes. Compression bandaging to RLE in sitting: Cocoa butter applied, NEW TG soft from foot to mid thigh, elastomull to digits 1-4, artiflex around foot/ankle, rosidal from just below knee to mid thigh, 1 6cm bandage at foot, 1 8cm bandage at ankle, 1 12 cm from foot to knee, 1 12 cm from lower leg to mid thigh with spiral, 1 12 cm from lower leg to mid thigh  07/25/2022 MLD: in supine with HOB elevated as follows: short neck, No abdominals due to earlier stomach virus, Rt inguinal nodes and then R LE working proximal to distal,  intact sequence, moving fluid towards inguinal nodes. Compression bandaging to RLE in sitting: Cocoa butter applied, NEW TG soft from foot to mid thigh, elastomull to digits 1-5, artiflex around foot/ankle, rosidal from just below knee to mid thigh, 1  6cm bandage at foot, 1 8cm bandage at ankle, 1 12 cm from foot to knee, 1 12 cm from lower leg to mid thigh with spiral, 1 12 cm from lower leg to mid thigh   07/18/22 Manual Therapy MLD: in supine with HOB elevated as follows: short neck, superficial and deep abdominals, Rt inguinal nodes and then R LE working proximal to distal, intact sequence, moving fluid towards inguinal nodes.  Compression bandaging to RLE in supine: Cocoa butter applied, TG soft from foot to mid thigh, elastomull to digits 1-4, artiflex around foot/ankle, rosidal from just below knee to mid thigh, 1 6cm bandage at foot, 1 8cm bandage at ankle, 1 12 cm from foot to knee, 1 12 cm from lower leg to mid thigh with spiral, 1 12 cm from lower leg to mid thigh  07/16/22 Manual Therapy MLD: in supine with HOB elevated as follows: short neck, superficial and deep abdominals, Rt inguinal nodes and then R LE working proximal to distal, intact sequence, moving fluid towards inguinal nodes.  Compression bandaging to RLE in supine: Cocoa butter applied,  TG soft from foot to mid thigh, elastomull to digits 1-4, artiflex around foot/ankle, rosidal from just below knee to mid thigh, 1 6cm bandage at foot, 1 8cm bandage at ankle, 1 12 cm from foot to knee, 1 12 cm from lower leg to mid thigh with spiral, 1 12 cm from lower leg to mid thigh  07/14/22 Orthotic Fit At beginning of session measured pt for a [employer] and she fits well into the size of Large and tall for length. She would like to see if her insurance coverage assessed so will fax her demographics to one of our DME companies to verify her coverage, per pt request.  Manual Therapy MLD: in supine with HOB elevated as follows:  short neck, superficial and deep abdominals, Rt inguinal nodes and then R LE working proximal to distal, intact sequence, moving fluid towards inguinal nodes.  Compression bandaging to RLE in supine: Cocoa butter applied, TG soft from foot to mid thigh, elastomull to digits 1-4, artiflex around foot/ankle, rosidal from just below knee to mid thigh, 1 6cm bandage at foot, 1 8cm bandage at ankle, 1 12 cm from foot to knee, 1 12 cm from lower leg to mid thigh with spiral, 1 12 cm from lower leg to mid thigh   PATIENT EDUCATION:  Education details: causes of edema, how a DVT can lead to prolonged edema in combination with recent surgery, need for bandaging and then eventually compression garments Person educated: Patient Education method: Explanation Education comprehension: verbalized understanding  HOME EXERCISE PROGRAM: Wear compression bandaging 24/7 and remove if pain or numbness that does not go away when you move  ASSESSMENT:  CLINICAL IMPRESSION: Pt arrived late for session today and had increased edema more distal on her Rt LE today, especially near ankle and an indentation from her sock so focused more time on MLD today. Then ended session with NuStep.   OBJECTIVE IMPAIRMENTS: decreased knowledge of condition, decreased knowledge of use of DME, increased edema, and pain.   ACTIVITY LIMITATIONS: standing and standing, walking  PARTICIPATION LIMITATIONS: occupation  PERSONAL FACTORS:  none  are also affecting patient's functional outcome.   REHAB POTENTIAL: Excellent  CLINICAL DECISION MAKING: Stable/uncomplicated  EVALUATION COMPLEXITY: Low  GOALS: Goals reviewed with patient? Yes  SHORT TERM GOALS=LONG TERM GOALS Target date: 09/16/2022    Pt will demonstrate a 4 cm decrease in edema 10  cm proximal to lateral malleoli to improve comfort.  Baseline: 26.5cm  Goal status: INITIAL  2.  Pt will obtain appropriate compression garments for long term management of edema.   Baseline:  Goal status: INITIAL  3.  Pt will be independent in self MLD for long term management of edema.  Baseline:  Goal status: INITIAL  4.  Pt will report a 75% improvement in discomfort and pain in RLE to allow improved ability to walk and stand.  Baseline:  Goal status: INITIAL  PLAN:  PT FREQUENCY: 3x/week  PT DURATION: 4 weeks  PLANNED INTERVENTIONS: Therapeutic exercises, Therapeutic activity, Patient/Family education, Self Care, Orthotic/Fit training, Manual lymph drainage, Compression bandaging, Taping, Vasopneumatic device, and Manual therapy  PLAN FOR NEXT SESSION:  How was MLD? How was stocking? Schedule visits for ortho - doesn't want to do pool    Otelia Limes, PTA 08/19/2022, 9:05 AM

## 2022-08-21 ENCOUNTER — Ambulatory Visit: Payer: Commercial Managed Care - HMO | Admitting: Rehabilitation

## 2022-08-26 ENCOUNTER — Ambulatory Visit: Payer: Commercial Managed Care - HMO

## 2022-08-28 ENCOUNTER — Ambulatory Visit: Payer: Commercial Managed Care - HMO

## 2022-08-28 DIAGNOSIS — R262 Difficulty in walking, not elsewhere classified: Secondary | ICD-10-CM

## 2022-08-28 DIAGNOSIS — R600 Localized edema: Secondary | ICD-10-CM | POA: Diagnosis not present

## 2022-08-28 DIAGNOSIS — G8929 Other chronic pain: Secondary | ICD-10-CM

## 2022-08-28 DIAGNOSIS — Z96651 Presence of right artificial knee joint: Secondary | ICD-10-CM

## 2022-08-28 DIAGNOSIS — M25661 Stiffness of right knee, not elsewhere classified: Secondary | ICD-10-CM

## 2022-08-28 DIAGNOSIS — M6281 Muscle weakness (generalized): Secondary | ICD-10-CM
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
## 2022-09-02 ENCOUNTER — Ambulatory Visit: Payer: Commercial Managed Care - HMO | Admitting: Rehabilitation

## 2022-09-04 ENCOUNTER — Ambulatory Visit: Payer: Commercial Managed Care - HMO

## 2022-09-09 ENCOUNTER — Ambulatory Visit: Payer: Commercial Managed Care - HMO | Attending: Hematology and Oncology

## 2022-09-09 DIAGNOSIS — Z96651 Presence of right artificial knee joint: Secondary | ICD-10-CM | POA: Insufficient documentation

## 2022-09-09 DIAGNOSIS — M6281 Muscle weakness (generalized): Secondary | ICD-10-CM | POA: Insufficient documentation

## 2022-09-09 DIAGNOSIS — R262 Difficulty in walking, not elsewhere classified: Secondary | ICD-10-CM | POA: Insufficient documentation

## 2022-09-09 DIAGNOSIS — M25661 Stiffness of right knee, not elsewhere classified: Secondary | ICD-10-CM | POA: Insufficient documentation

## 2022-09-09 DIAGNOSIS — G8929 Other chronic pain: Secondary | ICD-10-CM | POA: Insufficient documentation

## 2022-09-09 DIAGNOSIS — R600 Localized edema: Secondary | ICD-10-CM | POA: Insufficient documentation

## 2022-09-09 DIAGNOSIS — M25561 Pain in right knee: Secondary | ICD-10-CM | POA: Insufficient documentation

## 2022-09-11 ENCOUNTER — Ambulatory Visit: Payer: Commercial Managed Care - HMO | Admitting: Rehabilitation

## 2022-09-30 ENCOUNTER — Ambulatory Visit: Payer: 59 | Admitting: Plastic Surgery

## 2022-10-14 ENCOUNTER — Ambulatory Visit (INDEPENDENT_AMBULATORY_CARE_PROVIDER_SITE_OTHER): Payer: 59 | Admitting: Physician Assistant

## 2022-10-14 ENCOUNTER — Ambulatory Visit (INDEPENDENT_AMBULATORY_CARE_PROVIDER_SITE_OTHER): Payer: 59

## 2022-10-14 DIAGNOSIS — Z96651 Presence of right artificial knee joint: Secondary | ICD-10-CM

## 2022-10-14 MED ORDER — TRAMADOL HCL 50 MG PO TABS: 50.0000 mg | ORAL_TABLET | Freq: Two times a day (BID) | ORAL | 2 refills | Status: DC | PRN

## 2022-10-14 NOTE — Progress Notes (Signed)
Office Visit Note   Patient: Christina [name]           Date of Birth: 12-Aug-1966           MRN: [id number] Visit Date: 10/14/2022              Requested by: Maurice Small, MD [place],  [place] [address] PCP: Maurice Small, MD   Assessment & Plan: Visit Diagnoses:  1. H/O total knee replacement, right     Plan: Impression is little over 6 months status post right total knee replacement.  It is somewhat difficult to tell if she is having some micromotion in the implant as she does have pain with weightbearing only or if she is just dealing with postsurgical changes.  We have discussed backing off activity as needed.  She will follow-up with [name] in 3 months for repeat evaluation and 2 view x-rays of the right knee.  Dental prophylaxis reinforced.  Call with concerns or questions.  Follow-Up Instructions: Return in about 3 months (around 01/12/2023).   Orders:  Orders Placed This Encounter  Procedures   XR Knee 1-2 Views Right   No orders of the defined types were placed in this encounter.     Procedures: No procedures performed   Clinical Data: No additional findings.   Subjective: No chief complaint on file.   HPI patient is a very pleasant 57 year old female who comes in today little over 6 months status post right total knee replacement.  She has struggled with pain to the medial knee for the past several months.  Her pain did seem to improve but over the weekend returned.  The pain she has is all to the medial knee and is only occurring with ambulating.  She had 1 over the weekend where she had to use her cane.  She is not taking anything for the pain.  Of note, she developed acute DVT following total knee replacement and has had swelling to the right lower extremity.  She is wearing compression socks for these.     Objective: Vital Signs: LMP  (LMP Unknown)     Ortho Exam right knee exam shows no effusion.  Range of motion 0 to 125  degrees.  No joint line tenderness.  She is stable to valgus and varus stress.  She is neurovascular intact distally.  Specialty Comments:  No specialty comments available.  Imaging: XR Knee 1-2 Views Right  Result Date: 10/14/2022 X-rays demonstrate mild lucency to the medial aspect of the tibial tray, however there is no interval change from her last x-ray 2 months ago.    PMFS History: Patient Active Problem List   Diagnosis Date Noted   DVT (deep venous thrombosis) ([place]) 05/11/2022   Acute pulmonary embolism ([place]) 05/09/2022   Chest pain 05/09/2022   Elevated troponin 05/09/2022   Status post total right knee replacement 03/31/2022   Primary osteoarthritis of right knee 03/20/2022   Eating disorder 03/20/2022   Acute medial meniscus tear, right, initial encounter 10/01/2021   At risk for dehydration 01/28/2021   At risk for malnutrition 01/01/2021   Obesity, Class I, BMI 30-34.9 12/31/2020   Vitamin B12 deficiency 10/08/2020   At risk for depression 10/08/2020   Mood disorder ([place]), with emotional eating 09/24/2020   At risk for impaired metabolic function 81/82/9937   At risk for diabetes mellitus 07/30/2020   Vitamin D deficiency 07/02/2020   Depression 07/02/2020   At risk for side  effect of medication 07/02/2020   Stress due to illness of family member-  daughter with drug addiction 05/15/2020   Depression, recurrent ([place]) 05/15/2020   Anemia 05/15/2020   Constipation 05/15/2020   Prediabetes 05/15/2020   Breast asymmetry following reconstructive surgery 04/24/2020   Acquired absence of breast 04/24/2020   S/P breast reconstruction, bilateral 04/24/2020   Acute medial meniscus tear of left knee 12/20/2019   Breast cancer, left ([place]) 01/09/2016   Genetic testing 78/58/8502   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   Family history of breast cancer in female 11/08/2015   Family history of colon cancer 11/08/2015   Malignant neoplasm of central portion of left  breast in female, estrogen receptor positive ([place]) 10/31/2015   Endometriosis 03/11/2011   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,  multifocal IDC, DCIS, ER/PR+,  01-09-2016 s/p bilteral mastectomies w/ left sln bx;  no chemoradiation   Cataracts, bilateral    Depression    Diabetes mellitus without complication (HCC)    type 2   GAD (generalized anxiety disorder)    Gallbladder problem    History of ovarian cyst    IBS (irritable bowel syndrome)    Joint pain    PONV (postoperative nausea and vomiting)    does well with scop patch   Retinal detachment    Rheg OS   Right knee meniscal tear    Urgency of urination     Family History  Problem Relation Age of Onset   Heart failure Father    Prostate cancer Father 83   Retinal detachment Father    Colon polyps Mother        approx 2   Other Mother        hx HPV and hysterectomy due to precancerous cells   Depression Mother    Anxiety disorder Mother    Other Sister 58       hx of hysterectomy for unspecified reason; still has ovaries   Other Sister 55       paternal half-sister hx of hysterectomy for unspecified reason; still has ovaries   Bladder Cancer Maternal Uncle 79       not a smoker   Kidney failure Maternal Grandmother    Congestive Heart Failure Maternal Grandmother    Colon cancer Maternal Grandmother 70   Diabetes Maternal Grandmother    Lung cancer Maternal Grandfather 59       smoker   Breast cancer Paternal Grandmother        dx. early 9s; w/ hx of trauma to breast   Crohn's disease Daughter    Lung cancer Maternal Uncle 37       smoker    Past Surgical History:  Procedure Laterality Date   ABDOMINAL HYSTERECTOMY  05/1996   endometriosis   ACHILLES TENDON REPAIR Right 2010;  revision 2011   BLADDER SUSPENSION  2000   BREAST BIOPSY Left 10/2015   BREAST IMPLANT REMOVAL  Bilateral 08/12/2017   Procedure: REMOVAL BILATERAL BREAST IMPLANTS;  Surgeon: Wallace Going, DO;  Location: [place];  Service: Plastics;  Laterality: Bilateral;   BREAST RECONSTRUCTION WITH PLACEMENT OF TISSUE EXPANDER AND FLEX HD (ACELLULAR HYDRATED DERMIS) Bilateral 01/09/2016   BREAST RECONSTRUCTION WITH PLACEMENT OF TISSUE EXPANDER AND FLEX HD (ACELLULAR HYDRATED DERMIS) Bilateral 01/09/2016   Procedure:  BREAST RECONSTRUCTION WITH PLACEMENT OF TISSUE EXPANDER AND FLEX HD (ACELLULAR HYDRATED DERMIS);  Surgeon: Loel Lofty Dillingham, DO;  Location: [place];  Service: Plastics;  Laterality: Bilateral;   BREAST RECONSTRUCTION WITH PLACEMENT OF TISSUE EXPANDER AND FLEX HD (ACELLULAR HYDRATED DERMIS) Bilateral 05/29/2016   Procedure: PLACEMENT OF BILATERAL TISSUE EXPANDER AND FLEX HD (ACELLULAR HYDRATED DERMIS);  Surgeon: Wallace Going, DO;  Location: [place];  Service: Plastics;  Laterality: Bilateral;   BREAST REDUCTION SURGERY Bilateral 11/26/2016   Procedure: BILATERAL BREAST CAPSULE CONTRACTURE RELASE;  Surgeon: Wallace Going, DO;  Location: [place];  Service: Plastics;  Laterality: Bilateral;   [place]; 1989; [place] Right x3   last one 02-01-2019 @ [facility]   EYE SURGERY Left 10/01/2020   Pneumatic retinopexy for repair of rheg RD - Dr. Bernarda Caffey   EYE SURGERY Left 10/04/2020   PPV - Dr. Bernarda Caffey   FAT GRAFTING BILATERAL BREAST  08-09-2018  '@[facility]'$    GAS INSERTION Left 10/04/2020   Procedure: INSERTION OF GAS;  Surgeon: Bernarda Caffey, MD;  Location: [place];  Service: Ophthalmology;  Laterality: Left;   GAS/FLUID EXCHANGE Left 10/04/2020   Procedure: GAS/FLUID EXCHANGE;  Surgeon: Bernarda Caffey, MD;  Location: [place];  Service: Ophthalmology;  Laterality: Left;   INCISION AND DRAINAGE OF WOUND Bilateral 02/11/2016   Procedure: IRRIGATION AND DEBRIDEMENT OF BILATERAL BREAST POCKET;  Surgeon: Wallace Going, DO;  Location: [place];  Service: Plastics;  Laterality: Bilateral;   KNEE ARTHROSCOPY WITH MEDIAL MENISECTOMY Right 12/20/2019   Procedure: KNEE ARTHROSCOPY WITH MEDIAL MENISECTOMY;  Surgeon: Marchia Bond, MD;  Location: [place];  Service: Orthopedics;  Laterality: Right;   KNEE ARTHROSCOPY WITH MEDIAL MENISECTOMY Right 10/30/2021   Procedure: RIGHT KNEE ARTHROSCOPY WITH PARTIAL MEDIAL MENISECTOMY SYNOVECTOMY;  Surgeon: Leandrew Koyanagi, MD;  Location: [place];  Service: Orthopedics;  Laterality: Right;   LAPAROSCOPIC APPENDECTOMY  04-07-2011   '@[facility]'$    w/ Excision peritoneal lipoma and lysis adhesions   LAPAROSCOPIC CHOLECYSTECTOMY  ~ [place] Right 10/04/2020   Procedure: LASER RETINOPEXY WITH INDIRECT LASER OPTHALMOSCOPE, RIGHT EYE;  Surgeon: Bernarda Caffey, MD;  Location: [place];  Service: Ophthalmology;  Laterality: Right;   LIPOSUCTION WITH LIPOFILLING Bilateral 11/26/2016   Procedure: LIPOFILLING FOR SYMMETRY;  Surgeon: Wallace Going, DO;  Location: [place];  Service: Plastics;  Laterality: Bilateral;   LIPOSUCTION WITH LIPOFILLING Bilateral 01/21/2017   Procedure: BILATERAL BREAST  LIPOFILLING FOR ASYMMETRY;  Surgeon: Wallace Going, DO;  Location: [place];  Service: Plastics;  Laterality: Bilateral;   LIPOSUCTION WITH LIPOFILLING Bilateral 06/28/2020   Procedure: Lipofilling bilateral breasts for asymmetry;  Surgeon: Wallace Going, DO;  Location: [place];  Service: Plastics;  Laterality: Bilateral;  90 min   MASTECTOMY Bilateral 01/09/2016   NIPPLE SPARING MASTECTOMY/SENTINAL LYMPH NODE BIOPSY/RECONSTRUCTION/PLACEMENT OF TISSUE EXPANDER Bilateral 01/09/2016   Procedure: BILATERAL NIPPLE SPARING MASTECTOMY WITH LEFT SENTINAL LYMPH NODE BIOPSY ;  Surgeon: Stark Klein, MD;  Location: [place];  Service: General;  Laterality: Bilateral;    PHOTOCOAGULATION WITH LASER Left 10/04/2020   Procedure: PHOTOCOAGULATION WITH LASER;  Surgeon: Bernarda Caffey, MD;  Location: [place];  Service: Ophthalmology;  Laterality: Left;   REMOVAL OF BILATERAL TISSUE EXPANDERS WITH PLACEMENT OF BILATERAL BREAST IMPLANTS Bilateral 08/20/2016   Procedure: REMOVAL OF BILATERAL TISSUE EXPANDERS WITH PLACEMENT OF BILATERAL SILICONE  IMPLANTS;  Surgeon: Wallace Going, DO;  Location: [place];  Service: Plastics;  Laterality: Bilateral;   REMOVAL OF BILATERAL TISSUE EXPANDERS WITH PLACEMENT OF BILATERAL BREAST IMPLANTS Bilateral 11/05/2017   Procedure: REMOVAL OF BILATERAL TISSUE EXPANDERS WITH PLACEMENT OF BILATERAL BREAST SILICONE IMPLANTS;  Surgeon: Wallace Going, DO;  Location: [place];  Service: Plastics;  Laterality: Bilateral;   REMOVAL OF TISSUE EXPANDER Bilateral 02/11/2016   Procedure: REMOVAL OF BILATERAL TISSUE EXPANDERS AND FLEX HD REMOVAL;  Surgeon: Wallace Going, DO;  Location: [place];  Service: Plastics;  Laterality: Bilateral;   RETINAL DETACHMENT SURGERY Left 10/01/2020   Pneumatic retinopexy for repair of rheg RD - Dr. Bernarda Caffey   RETINAL DETACHMENT SURGERY Left 10/04/2020   PPV - Dr. Bernarda Caffey   SCLERAL BUCKLE Left 10/04/2020   Procedure: SCLERAL BUCKLE LEFT EYE;  Surgeon: Bernarda Caffey, MD;  Location: [place];  Service: Ophthalmology;  Laterality: Left;   TISSUE EXPANDER PLACEMENT Bilateral 08/12/2017   Procedure: PLACEMENT OF BILATERAL TISSUE EXPANDER;  Surgeon: Wallace Going, DO;  Location: [place];  Service: Plastics;  Laterality: Bilateral;   TOTAL KNEE ARTHROPLASTY Right 03/31/2022   Procedure: RIGHT TOTAL KNEE REPLACEMENT;  Surgeon: Leandrew Koyanagi, MD;  Location: [place];  Service: Orthopedics;  Laterality: Right;   TUBAL LIGATION Bilateral 1991   VITRECTOMY 25 GAUGE WITH SCLERAL BUCKLE Left 10/04/2020   Procedure: 25 GAUGE PARS PLANA VITRECTOMY  LEFT EYE ;  Surgeon: Bernarda Caffey, MD;  Location: [place];  Service: Ophthalmology;  Laterality: Left;   Social History   Occupational History   Occupation: Holiday representative: Education administrator  Tobacco Use   Smoking status: Former    Packs/day: 1.00    Years: 10.00    Total pack years: 10.00    Types: Cigarettes    Quit date: 06/08/2008    Years since quitting: 14.3   Smokeless tobacco: Never  Vaping Use   Vaping Use: Never used  Substance and Sexual Activity   Alcohol use: No   Drug use: Never   Sexual activity: Never    Birth control/protection: Surgical    Comment: hysterectomy

## 2022-10-15 NOTE — Progress Notes (Shared)
[facility] Note  10/22/2022     CHIEF COMPLAINT Patient presents for No chief complaint on file.   HISTORY OF PRESENT ILLNESS: Christina [name] is a 57 y.o. female who presents to the clinic today for:     Referring physician: Maurice Small, MD [address],  [place] [address]  HISTORICAL INFORMATION:    Selected notes from the MEDICAL RECORD NUMBER Referred by Dr. Quentin Ore for RD OS   CURRENT MEDICATIONS: Current Outpatient Medications (Ophthalmic Drugs)  Medication Sig   cycloSPORINE (RESTASIS) 0.05 % ophthalmic emulsion Place 1 drop into both eyes 2 (two) times daily.   No current facility-administered medications for this visit. (Ophthalmic Drugs)   Current Outpatient Medications (Other)  Medication Sig   amitriptyline (ELAVIL) 150 MG tablet Take 150 mg by mouth at bedtime.   buPROPion (WELLBUTRIN XL) 300 MG 24 hr tablet Take 300 mg by mouth every morning.   cholestyramine light 4 g POWD Take 1 packet (4 g total) by mouth 2 (two) times daily as needed.   methocarbamol (ROBAXIN-750) 750 MG tablet Take 1 tablet (750 mg total) by mouth 2 (two) times daily as needed. (Patient taking differently: Take 750 mg by mouth 2 (two) times daily as needed for muscle spasms.)   ondansetron (ZOFRAN-ODT) 4 MG disintegrating tablet Take 1 tablet (4 mg total) by mouth every 8 (eight) hours as needed for nausea or vomiting.   potassium chloride SA (KLOR-CON M) 20 MEQ tablet Take 1 tablet (20 mEq total) by mouth 2 (two) times daily.   REXULTI 2 MG TABS tablet Take 2 mg by mouth at bedtime.   RIVAROXABAN (XARELTO) VTE STARTER PACK (15 & 20 MG) Follow package directions: Take one '15mg'$  tablet by mouth twice a day. On day 22, switch to one '20mg'$  tablet once a day. Take with food.   traMADol (ULTRAM) 50 MG tablet Take 1 tablet (50 mg total) by mouth every 12 (twelve) hours as needed.   Vitamin D, Ergocalciferol, (DRISDOL) 1.25 MG (50000 UNIT)  CAPS capsule 1 po q wed and 1 po q sun (Patient taking differently: Take 50,000 Units by mouth every 7 (seven) days. Every Sunday)   No current facility-administered medications for this visit. 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,  multifocal IDC, DCIS, ER/PR+,  01-09-2016 s/p bilteral mastectomies w/ left sln bx;  no chemoradiation   Cataracts, bilateral    Depression    Diabetes mellitus without complication ([place])    type 2   GAD (generalized anxiety disorder)    Gallbladder problem    History of ovarian cyst    IBS (irritable bowel syndrome)    Joint pain    PONV (postoperative nausea and vomiting)    does well with scop patch   Retinal detachment    Rheg OS   Right knee meniscal tear    Urgency of urination    Past Surgical History:  Procedure Laterality Date   ABDOMINAL HYSTERECTOMY  05/1996   endometriosis   ACHILLES TENDON REPAIR Right 2010;  revision 2011   BLADDER SUSPENSION  2000   BREAST BIOPSY Left 10/2015   BREAST IMPLANT REMOVAL Bilateral 08/12/2017   Procedure:  REMOVAL BILATERAL BREAST IMPLANTS;  Surgeon: Wallace Going, DO;  Location: [place];  Service: Plastics;  Laterality: Bilateral;   BREAST RECONSTRUCTION WITH PLACEMENT OF TISSUE EXPANDER AND FLEX HD (ACELLULAR HYDRATED DERMIS) Bilateral 01/09/2016   BREAST RECONSTRUCTION WITH PLACEMENT OF TISSUE EXPANDER AND FLEX HD (ACELLULAR HYDRATED DERMIS) Bilateral 01/09/2016   Procedure: BREAST RECONSTRUCTION WITH PLACEMENT OF TISSUE EXPANDER AND FLEX HD (ACELLULAR HYDRATED DERMIS);  Surgeon: Loel Lofty Dillingham, DO;  Location: [place];  Service: Plastics;  Laterality: Bilateral;   BREAST RECONSTRUCTION WITH PLACEMENT OF TISSUE EXPANDER AND FLEX HD (ACELLULAR HYDRATED DERMIS) Bilateral  05/29/2016   Procedure: PLACEMENT OF BILATERAL TISSUE EXPANDER AND FLEX HD (ACELLULAR HYDRATED DERMIS);  Surgeon: Wallace Going, DO;  Location: [place];  Service: Plastics;  Laterality: Bilateral;   BREAST REDUCTION SURGERY Bilateral 11/26/2016   Procedure: BILATERAL BREAST CAPSULE CONTRACTURE RELASE;  Surgeon: Wallace Going, DO;  Location: [place];  Service: Plastics;  Laterality: Bilateral;   [place]; 1989; [place] Right x3   last one 02-01-2019 @ [facility]   EYE SURGERY Left 10/01/2020   Pneumatic retinopexy for repair of rheg RD - Dr. Bernarda Caffey   EYE SURGERY Left 10/04/2020   PPV - Dr. Bernarda Caffey   FAT GRAFTING BILATERAL BREAST  08-09-2018  '@[facility]'$    GAS INSERTION Left 10/04/2020   Procedure: INSERTION OF GAS;  Surgeon: Bernarda Caffey, MD;  Location: [place];  Service: Ophthalmology;  Laterality: Left;   GAS/FLUID EXCHANGE Left 10/04/2020   Procedure: GAS/FLUID EXCHANGE;  Surgeon: Bernarda Caffey, MD;  Location: [place];  Service: Ophthalmology;  Laterality: Left;   INCISION AND DRAINAGE OF WOUND Bilateral 02/11/2016   Procedure: IRRIGATION AND DEBRIDEMENT OF BILATERAL BREAST POCKET;  Surgeon: Wallace Going, DO;  Location: [place];  Service: Plastics;  Laterality: Bilateral;   KNEE ARTHROSCOPY WITH MEDIAL MENISECTOMY Right 12/20/2019   Procedure: KNEE ARTHROSCOPY WITH MEDIAL MENISECTOMY;  Surgeon: Marchia Bond, MD;  Location: [place];  Service: Orthopedics;  Laterality: Right;   KNEE ARTHROSCOPY WITH MEDIAL MENISECTOMY Right 10/30/2021   Procedure: RIGHT KNEE ARTHROSCOPY WITH PARTIAL MEDIAL MENISECTOMY SYNOVECTOMY;  Surgeon: Leandrew Koyanagi, MD;  Location: [place];  Service: Orthopedics;  Laterality: Right;   LAPAROSCOPIC APPENDECTOMY  04-07-2011   '@[facility]'$    w/ Excision peritoneal lipoma and lysis adhesions   LAPAROSCOPIC CHOLECYSTECTOMY  ~ [place]  Right 10/04/2020   Procedure: LASER RETINOPEXY WITH INDIRECT LASER OPTHALMOSCOPE, RIGHT EYE;  Surgeon: Bernarda Caffey, MD;  Location: [place];  Service: Ophthalmology;  Laterality: Right;   LIPOSUCTION WITH LIPOFILLING Bilateral 11/26/2016   Procedure: LIPOFILLING FOR SYMMETRY;  Surgeon: Wallace Going, DO;  Location: [place];  Service: Plastics;  Laterality: Bilateral;   LIPOSUCTION WITH LIPOFILLING Bilateral 01/21/2017   Procedure: BILATERAL BREAST  LIPOFILLING FOR ASYMMETRY;  Surgeon: Wallace Going, DO;  Location: [place];  Service: Plastics;  Laterality: Bilateral;   LIPOSUCTION WITH LIPOFILLING Bilateral 06/28/2020   Procedure: Lipofilling bilateral breasts for asymmetry;  Surgeon: Wallace Going, DO;  Location: [place];  Service: Plastics;  Laterality: Bilateral;  90 min   MASTECTOMY Bilateral 01/09/2016   NIPPLE SPARING MASTECTOMY/SENTINAL LYMPH NODE BIOPSY/RECONSTRUCTION/PLACEMENT OF TISSUE EXPANDER Bilateral 01/09/2016   Procedure: BILATERAL NIPPLE SPARING MASTECTOMY WITH LEFT SENTINAL LYMPH NODE BIOPSY ;  Surgeon: Stark Klein, MD;  Location: [place];  Service: General;  Laterality: Bilateral;   PHOTOCOAGULATION WITH LASER Left 10/04/2020   Procedure: PHOTOCOAGULATION WITH LASER;  Surgeon: Bernarda Caffey, MD;  Location: [place];  Service: Ophthalmology;  Laterality: Left;   REMOVAL OF BILATERAL TISSUE EXPANDERS WITH PLACEMENT OF BILATERAL BREAST IMPLANTS Bilateral 08/20/2016   Procedure: REMOVAL OF BILATERAL TISSUE EXPANDERS WITH PLACEMENT OF BILATERAL SILICONE IMPLANTS;  Surgeon: Wallace Going, DO;  Location: [place];  Service: Plastics;  Laterality: Bilateral;   REMOVAL OF BILATERAL TISSUE EXPANDERS WITH PLACEMENT OF BILATERAL BREAST IMPLANTS Bilateral 11/05/2017   Procedure: REMOVAL OF BILATERAL TISSUE EXPANDERS WITH PLACEMENT OF BILATERAL BREAST SILICONE IMPLANTS;  Surgeon: Wallace Going, DO;   Location: [place];  Service: Plastics;  Laterality: Bilateral;   REMOVAL OF TISSUE EXPANDER Bilateral 02/11/2016   Procedure: REMOVAL OF BILATERAL TISSUE EXPANDERS AND FLEX HD REMOVAL;  Surgeon: Wallace Going, DO;  Location: [place];  Service: Plastics;  Laterality: Bilateral;   RETINAL DETACHMENT SURGERY Left 10/01/2020   Pneumatic retinopexy for repair of rheg RD - Dr. Bernarda Caffey   RETINAL DETACHMENT SURGERY Left 10/04/2020   PPV - Dr. Bernarda Caffey   SCLERAL BUCKLE Left 10/04/2020   Procedure: SCLERAL BUCKLE LEFT EYE;  Surgeon: Bernarda Caffey, MD;  Location: [place];  Service: Ophthalmology;  Laterality: Left;   TISSUE EXPANDER PLACEMENT Bilateral 08/12/2017   Procedure: PLACEMENT OF BILATERAL TISSUE EXPANDER;  Surgeon: Wallace Going, DO;  Location: [place];  Service: Plastics;  Laterality: Bilateral;   TOTAL KNEE ARTHROPLASTY Right 03/31/2022   Procedure: RIGHT TOTAL KNEE REPLACEMENT;  Surgeon: Leandrew Koyanagi, MD;  Location: [place];  Service: Orthopedics;  Laterality: Right;   TUBAL LIGATION Bilateral 1991   VITRECTOMY 25 GAUGE WITH SCLERAL BUCKLE Left 10/04/2020   Procedure: 25 GAUGE PARS PLANA VITRECTOMY LEFT EYE ;  Surgeon: Bernarda Caffey, MD;  Location: [place];  Service: Ophthalmology;  Laterality: Left;   FAMILY HISTORY Family History  Problem Relation Age of Onset   Heart failure Father    Prostate cancer Father 46   Retinal detachment Father    Colon polyps Mother        approx 2   Other Mother        hx HPV and hysterectomy due to precancerous cells   Depression Mother    Anxiety disorder Mother    Other Sister 41       hx of hysterectomy for unspecified reason; still has ovaries   Other Sister 43       paternal half-sister hx of hysterectomy for unspecified reason; still has ovaries   Bladder Cancer Maternal Uncle 79       not a smoker   Kidney failure Maternal Grandmother    Congestive Heart Failure Maternal  Grandmother    Colon cancer Maternal Grandmother 70   Diabetes Maternal Grandmother    Lung cancer Maternal Grandfather 69       smoker   Breast cancer Paternal Grandmother        dx. early 89s; w/ hx of trauma to breast   Crohn's disease Daughter    Lung cancer Maternal Uncle 11       smoker   SOCIAL HISTORY Social History   Tobacco Use   Smoking status: Former    Packs/day: 1.00    Years: 10.00    Total pack years: 10.00    Types: Cigarettes    Quit date: 06/08/2008    Years since quitting: 26.3  Smokeless tobacco: Never  Vaping Use   Vaping Use: Never used  Substance Use Topics   Alcohol use: No   Drug use: Never       OPHTHALMIC EXAM:  Not recorded     IMAGING AND PROCEDURES  Imaging and Procedures for 10/22/2022          ASSESSMENT/PLAN:  No diagnosis found.  1. 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 Retinal hole, OD - small operculated hole at 0430 - s/p laser retinopexy OD (01.27.22) in OR -- good laser in place  3. Diabetes mellitus, type 2 without retinopathy - The incidence, risk factors for progression, natural history and treatment options for diabetic retinopathy  were discussed with patient.   - The need for close monitoring of blood glucose, blood pressure, and serum lipids, avoiding cigarette or any type of tobacco, and the need for long term follow up was also discussed with patient. - monitor  4. Pseudophakia OU  - s/p CE/IOL OU (OS: [place], 04.21.22, OD: 07.22)  -  IOLs in good position, beautiful surgeries  - Glasses not matching BCVA -- would likely benefit from updated MRx - monitor  Ophthalmic Meds Ordered this visit:  No orders of the defined types were placed in this encounter.    No follow-ups on file.  There are no Patient Instructions on file for this visit.   Explained the diagnoses, plan, and follow up with the patient and they expressed understanding.  Patient expressed understanding of the importance of proper follow up care.   This document serves as a record of services personally performed by Gardiner Sleeper, MD, PhD. It was created on their behalf by Orvan Falconer, an ophthalmic technician. The creation of this record is the provider's dictation and/or activities during the visit.    Electronically signed by: Orvan Falconer, OA, 10/15/22  10:15 AM   Gardiner Sleeper, M.D., Ph.D. Diseases & Surgery of the Retina and Vitreous [facility]: M myopia (nearsighted); A astigmatism; H hyperopia (farsighted); P presbyopia; Mrx spectacle prescription;  CTL contact lenses; OD right eye; OS left eye; OU both eyes  XT exotropia; ET esotropia; PEK punctate epithelial keratitis; PEE punctate epithelial erosions; DES dry eye syndrome; MGD meibomian gland dysfunction; ATs artificial tears; PFAT's preservative free artificial tears; [place] nuclear sclerotic cataract; PSC posterior subcapsular cataract; ERM epi-retinal membrane; PVD posterior vitreous detachment; RD retinal detachment; DM diabetes mellitus; DR diabetic retinopathy; NPDR non-proliferative diabetic retinopathy; PDR proliferative diabetic retinopathy; CSME clinically significant macular edema; DME diabetic macular edema; dbh dot blot hemorrhages; CWS cotton wool spot; POAG primary open angle glaucoma; C/D cup-to-disc ratio; HVF humphrey visual field; GVF goldmann visual field; OCT optical coherence tomography; IOP intraocular pressure; BRVO Branch  retinal vein occlusion; CRVO central retinal vein occlusion; CRAO central retinal  artery occlusion; BRAO branch retinal artery occlusion; RT retinal tear; SB scleral buckle; PPV pars plana vitrectomy; VH Vitreous hemorrhage; PRP panretinal laser photocoagulation; IVK intravitreal kenalog; VMT vitreomacular traction; MH Macular hole;  NVD neovascularization of the disc; NVE neovascularization elsewhere; AREDS age related eye disease study; ARMD age related macular degeneration; POAG primary open angle glaucoma; EBMD epithelial/anterior basement membrane dystrophy; ACIOL anterior chamber intraocular lens; IOL intraocular lens; PCIOL posterior chamber intraocular lens; Phaco/IOL phacoemulsification with intraocular lens placement; [place] photorefractive keratectomy; LASIK laser assisted in situ keratomileusis; HTN hypertension; DM diabetes mellitus; COPD chronic obstructive pulmonary disease

## 2022-10-22 ENCOUNTER — Encounter (INDEPENDENT_AMBULATORY_CARE_PROVIDER_SITE_OTHER): Payer: 59 | Admitting: Ophthalmology

## 2022-10-22 DIAGNOSIS — H33321 Round hole, right eye: Secondary | ICD-10-CM

## 2022-10-22 DIAGNOSIS — E119 Type 2 diabetes mellitus without complications: Secondary | ICD-10-CM

## 2022-10-22 DIAGNOSIS — Z961 Presence of intraocular lens: Secondary | ICD-10-CM

## 2022-10-22 DIAGNOSIS — H3322 Serous retinal detachment, left eye: Secondary | ICD-10-CM

## 2022-12-05 ENCOUNTER — Ambulatory Visit: Payer: 59 | Admitting: Family Medicine

## 2023-01-09 ENCOUNTER — Ambulatory Visit (INDEPENDENT_AMBULATORY_CARE_PROVIDER_SITE_OTHER): Payer: 59 | Admitting: Plastic Surgery

## 2023-01-09 ENCOUNTER — Encounter: Payer: Self-pay | Admitting: Plastic Surgery

## 2023-01-09 DIAGNOSIS — Z170 Estrogen receptor positive status [ER+]: Secondary | ICD-10-CM | POA: Diagnosis not present

## 2023-01-09 DIAGNOSIS — N651 Disproportion of reconstructed breast: Secondary | ICD-10-CM | POA: Diagnosis not present

## 2023-01-09 DIAGNOSIS — Z853 Personal history of malignant neoplasm of breast: Secondary | ICD-10-CM | POA: Diagnosis not present

## 2023-01-09 DIAGNOSIS — Z803 Family history of malignant neoplasm of breast: Secondary | ICD-10-CM

## 2023-01-09 NOTE — Progress Notes (Signed)
Patient ID: Christina [name], female    DOB: 11-23-1965, 57 y.o.   MRN: [id number]   Chief Complaint  Patient presents with   Follow-up   Breast Problem    The patient is a 57 year old female here for yearly follow-up on her breast reconstruction.  She was diagnosed with breast cancer in 2017.  She had calcifications of the left breast.  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  It was estrogen and progesterone positive.  She is 5 feet 7 inches tall.  She opted for bilateral mastectomies with reconstruction.  May 2017 she had the expanders placed.  1 month later the expander was removed because of some complications with seroma.  They were put back in and September 2017.  In December 2017 they were removed and she had Mentor smooth high profile gel 455 cc implants placed.  She had lipo filling in March 2018 and May 2018.  She then had the implants removed in December 2018 and had expanders put back in.  And February 2019 the expanders were removed and Mentor smooth round ultrahigh profile gel 590 cc implants were placed.  October 2021 she had fat grafting.  She is generally happy about her breasts and looks so good it does not look like she even had any surgery.  Since her last visit she has had a right knee replacement she had a retinal detachment, she had cataract surgery and is being treated for DVTs.  She is on Xarelto.    Review of Systems  Constitutional: Negative.   HENT: Negative.    Eyes: Negative.   Respiratory: Negative.  Negative for chest tightness and shortness of breath.   Cardiovascular:  Positive for leg swelling.  Gastrointestinal: Negative.   Endocrine: Negative.   Genitourinary: Negative.   Musculoskeletal: Negative.     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,  multifocal IDC, DCIS, ER/PR+,   01-09-2016 s/p bilteral mastectomies w/ left sln bx;  no chemoradiation   Cataracts, bilateral    Depression    Diabetes mellitus without complication (HCC)    type 2   GAD (generalized anxiety disorder)    Gallbladder problem    History of ovarian cyst    IBS (irritable bowel syndrome)    Joint pain    PONV (postoperative nausea and vomiting)    does well with scop patch   Retinal detachment    Rheg OS   Right knee meniscal tear    Urgency of urination     Past Surgical History:  Procedure Laterality Date   ABDOMINAL HYSTERECTOMY  05/1996   endometriosis   ACHILLES TENDON REPAIR Right 2010;  revision 2011   BLADDER SUSPENSION  2000   BREAST BIOPSY Left 10/2015   BREAST IMPLANT REMOVAL Bilateral 08/12/2017   Procedure: REMOVAL BILATERAL BREAST IMPLANTS;  Surgeon: Peggye Form, DO;  Location: [facility];  Service: Plastics;  Laterality: Bilateral;   BREAST RECONSTRUCTION WITH PLACEMENT OF TISSUE EXPANDER AND FLEX HD (ACELLULAR HYDRATED DERMIS) Bilateral 01/09/2016   BREAST RECONSTRUCTION WITH PLACEMENT OF TISSUE EXPANDER AND FLEX HD (ACELLULAR HYDRATED DERMIS) Bilateral 01/09/2016   Procedure: BREAST RECONSTRUCTION WITH PLACEMENT OF TISSUE EXPANDER AND FLEX HD (ACELLULAR HYDRATED DERMIS);  Surgeon: Alena Bills Nani Ingram, DO;  Location: [facility];  Service:  Plastics;  Laterality: Bilateral;   BREAST RECONSTRUCTION WITH PLACEMENT OF TISSUE EXPANDER AND FLEX HD (ACELLULAR HYDRATED DERMIS) Bilateral 05/29/2016   Procedure: PLACEMENT OF BILATERAL TISSUE EXPANDER AND FLEX HD (ACELLULAR HYDRATED DERMIS);  Surgeon: Peggye Form, DO;  Location: [facility];  Service: Plastics;  Laterality: Bilateral;   BREAST REDUCTION SURGERY Bilateral 11/26/2016   Procedure: BILATERAL BREAST CAPSULE CONTRACTURE RELASE;  Surgeon: Peggye Form, DO;  Location: [facility];  Service: Plastics;  Laterality: Bilateral;   CESAREAN SECTION  1987; 1989; 1991   ELBOW  SURGERY Right x3   last one 02-01-2019 @ [facility]   EYE SURGERY Left 10/01/2020   Pneumatic retinopexy for repair of rheg RD - Dr. Rennis Chris   EYE SURGERY Left 10/04/2020   PPV - Dr. Rennis Chris   FAT GRAFTING BILATERAL BREAST  08-09-2018  @[facility]    GAS INSERTION Left 10/04/2020   Procedure: INSERTION OF GAS;  Surgeon: Rennis Chris, MD;  Location: [facility] OR;  Service: Ophthalmology;  Laterality: Left;   GAS/FLUID EXCHANGE Left 10/04/2020   Procedure: GAS/FLUID EXCHANGE;  Surgeon: Rennis Chris, MD;  Location: [facility] OR;  Service: Ophthalmology;  Laterality: Left;   INCISION AND DRAINAGE OF WOUND Bilateral 02/11/2016   Procedure: IRRIGATION AND DEBRIDEMENT OF BILATERAL BREAST POCKET;  Surgeon: Peggye Form, DO;  Location: [facility];  Service: Plastics;  Laterality: Bilateral;   KNEE ARTHROSCOPY WITH MEDIAL MENISECTOMY Right 12/20/2019   Procedure: KNEE ARTHROSCOPY WITH MEDIAL MENISECTOMY;  Surgeon: Teryl Lucy, MD;  [facility];  Service: Orthopedics;  Laterality: Right;   KNEE ARTHROSCOPY WITH MEDIAL MENISECTOMY Right 10/30/2021   Procedure: RIGHT KNEE ARTHROSCOPY WITH PARTIAL MEDIAL MENISECTOMY SYNOVECTOMY;  Surgeon: Tarry Kos, MD;  Location: [facility];  Service: Orthopedics;  Laterality: Right;   LAPAROSCOPIC APPENDECTOMY  04-07-2011   @[facility]    w/ Excision peritoneal lipoma and lysis adhesions   LAPAROSCOPIC CHOLECYSTECTOMY  ~ 1999   LASER PHOTO ABLATION Right 10/04/2020   Procedure: LASER RETINOPEXY WITH INDIRECT LASER OPTHALMOSCOPE, RIGHT EYE;  Surgeon: Rennis Chris, MD;  Location: [facility];  Service: Ophthalmology;  Laterality: Right;   LIPOSUCTION WITH LIPOFILLING Bilateral 11/26/2016   Procedure: LIPOFILLING FOR SYMMETRY;  Surgeon: Peggye Form, DO;  Location: [facility];  Service: Plastics;  Laterality: Bilateral;   LIPOSUCTION WITH LIPOFILLING Bilateral 01/21/2017   Procedure: BILATERAL BREAST  LIPOFILLING FOR  ASYMMETRY;  Surgeon: Peggye Form, DO;  Location: [facility];  Service: Plastics;  Laterality: Bilateral;   LIPOSUCTION WITH LIPOFILLING Bilateral 06/28/2020   Procedure: Lipofilling bilateral breasts for asymmetry;  Surgeon: Peggye Form, DO;  Location: [facility];  Service: Plastics;  Laterality: Bilateral;  90 min   MASTECTOMY Bilateral 01/09/2016   NIPPLE SPARING MASTECTOMY/SENTINAL LYMPH NODE BIOPSY/RECONSTRUCTION/PLACEMENT OF TISSUE EXPANDER Bilateral 01/09/2016   Procedure: BILATERAL NIPPLE SPARING MASTECTOMY WITH LEFT SENTINAL LYMPH NODE BIOPSY ;  Surgeon: Almond Lint, MD;  Location: [facility];  Service: General;  Laterality: Bilateral;   PHOTOCOAGULATION WITH LASER Left 10/04/2020   Procedure: PHOTOCOAGULATION WITH LASER;  Surgeon: Rennis Chris, MD;  Location: [facility] OR;  Service: Ophthalmology;  Laterality: Left;   REMOVAL OF BILATERAL TISSUE EXPANDERS WITH PLACEMENT OF BILATERAL BREAST IMPLANTS Bilateral 08/20/2016   Procedure: REMOVAL OF BILATERAL TISSUE EXPANDERS WITH PLACEMENT OF BILATERAL SILICONE IMPLANTS;  Surgeon: Peggye Form, DO;  Location: [facility];  Service: Plastics;  Laterality: Bilateral;   REMOVAL OF BILATERAL TISSUE  EXPANDERS WITH PLACEMENT OF BILATERAL BREAST IMPLANTS Bilateral 11/05/2017   Procedure: REMOVAL OF BILATERAL TISSUE EXPANDERS WITH PLACEMENT OF BILATERAL BREAST SILICONE IMPLANTS;  Surgeon: Peggye Form, DO;  Location: [facility];  Service: Plastics;  Laterality: Bilateral;   REMOVAL OF TISSUE EXPANDER Bilateral 02/11/2016   Procedure: REMOVAL OF BILATERAL TISSUE EXPANDERS AND FLEX HD REMOVAL;  Surgeon: Peggye Form, DO;  Location: [facility];  Service: Plastics;  Laterality: Bilateral;   RETINAL DETACHMENT SURGERY Left 10/01/2020   Pneumatic retinopexy for repair of rheg RD - Dr. Rennis Chris   RETINAL DETACHMENT SURGERY Left 10/04/2020   PPV - Dr. Rennis Chris    SCLERAL BUCKLE Left 10/04/2020   Procedure: SCLERAL BUCKLE LEFT EYE;  Surgeon: Rennis Chris, MD;  Location: [facility] OR;  Service: Ophthalmology;  Laterality: Left;   TISSUE EXPANDER PLACEMENT Bilateral 08/12/2017   Procedure: PLACEMENT OF BILATERAL TISSUE EXPANDER;  Surgeon: Peggye Form, DO;  Location: [facility];  Service: Plastics;  Laterality: Bilateral;   TOTAL KNEE ARTHROPLASTY Right 03/31/2022   Procedure: RIGHT TOTAL KNEE REPLACEMENT;  Surgeon: Tarry Kos, MD;  Location: [facility];  Service: Orthopedics;  Laterality: Right;   TUBAL LIGATION Bilateral 1991   VITRECTOMY 25 GAUGE WITH SCLERAL BUCKLE Left 10/04/2020   Procedure: 25 GAUGE PARS PLANA VITRECTOMY LEFT EYE ;  Surgeon: Rennis Chris, MD;  Location: [facility] OR;  Service: Ophthalmology;  Laterality: Left;      Current Outpatient Medications:    amitriptyline (ELAVIL) 150 MG tablet, Take 150 mg by mouth at bedtime., Disp: , Rfl:    REXULTI 2 MG TABS tablet, Take 2 mg by mouth at bedtime., Disp: , Rfl:    RIVAROXABAN (XARELTO) VTE STARTER PACK (15 & 20 MG), Follow package directions: Take one 15mg  tablet by mouth twice a day. On day 22, switch to one 20mg  tablet once a day. Take with food., Disp: 51 each, Rfl: 0   traMADol (ULTRAM) 50 MG tablet, Take 1 tablet (50 mg total) by mouth every 12 (twelve) hours as needed., Disp: 30 tablet, Rfl: 2   Objective:   There were no vitals filed for this visit.  Physical Exam Nursing note reviewed.  Constitutional:      Appearance: Normal appearance.  HENT:     Head: Normocephalic.  Cardiovascular:     Rate and Rhythm: Normal rate.     Pulses: Normal pulses.  Pulmonary:     Effort: Pulmonary effort is normal.  Musculoskeletal:        General: No swelling or deformity.  Skin:    General: Skin is warm.     Capillary Refill: Capillary refill takes less than 2 seconds.     Coloration: Skin is not jaundiced or pale.     Findings: No bruising or lesion.  Neurological:      Mental Status: She is alert and oriented to person, place, and time.  Psychiatric:        Mood and Affect: Mood normal.        Behavior: Behavior normal.        Thought Content: Thought content normal.        Judgment: Judgment normal.     Assessment & Plan:  Breast asymmetry following reconstructive surgery  Family history of breast cancer in female  Malignant neoplasm of central portion of left breast in female, estrogen receptor positive (HCC)  Commend compression socks for her lower extremities.  Fat grafting is an option but I  would not suggest doing anything until 2025 and hopefully she will be off of the anticoagulation.  The patient is in agreement and will come and see [name] next year.  Pictures were obtained of the patient and placed in the chart with the patient's or guardian's permission.   Alena Bills Arnav Cregg, DO

## 2023-01-13 ENCOUNTER — Ambulatory Visit
Admission: EM | Admit: 2023-01-13 | Discharge: 2023-01-13 | Disposition: A | Discharge status: Home / Self Care | Payer: 59 | Attending: Internal Medicine | Admitting: Internal Medicine

## 2023-01-13 ENCOUNTER — Ambulatory Visit
Admission: RE | Admit: 2023-01-13 | Discharge: 2023-01-13 | Disposition: A | Discharge status: Home / Self Care | Payer: 59 | Source: Ambulatory Visit | Attending: Internal Medicine | Admitting: Internal Medicine

## 2023-01-13 DIAGNOSIS — R0981 Nasal congestion: Secondary | ICD-10-CM | POA: Diagnosis not present

## 2023-01-13 DIAGNOSIS — R053 Chronic cough: Secondary | ICD-10-CM

## 2023-01-13 MED ORDER — PREDNISONE 20 MG PO TABS: 40.0000 mg | ORAL_TABLET | Freq: Every day | ORAL | 0 refills | Status: AC

## 2023-01-13 MED ORDER — DOXYCYCLINE HYCLATE 100 MG PO CAPS
100.0000 mg | ORAL_CAPSULE | Freq: Two times a day (BID) | ORAL | 0 refills | Status: DC
Start: 2023-01-13 — End: 2023-02-12

## 2023-01-13 MED ORDER — BENZONATATE 100 MG PO CAPS: 100.0000 mg | ORAL_CAPSULE | Freq: Three times a day (TID) | ORAL | 0 refills | Status: DC | PRN

## 2023-01-13 NOTE — ED Triage Notes (Signed)
Pt reports she has a cough that is not going away x 2 months. Took mucinex but no relief.

## 2023-01-13 NOTE — Discharge Instructions (Addendum)
Please go to [facility] imaging today to receive chest x-ray.  I will call you if any results are abnormal.  I have prescribed you 3 medications to help alleviate symptoms.  Follow-up if any symptoms persist or worsen.

## 2023-01-13 NOTE — ED Provider Notes (Signed)
[name]    CSN: [id number] Arrival date & time: 01/13/23  0827      History   Chief Complaint No chief complaint on file.   HPI Christina [name] is a 57 y.o. female.   Patient presents with persistent cough that has been present for about 2 months.  Reports that cough is dry at times and productive at other times.  Reports that symptoms started off similar to a cold virus but upper respiratory symptoms resolved, and cough was persistent.  Although, she developed new onset nasal congestion this morning.  She denies any fevers throughout the entirety of symptoms.  Denies chest pain, shortness of breath, gastrointestinal symptoms.  Patient has taken Mucinex with no improvement in symptoms.  Denies history of asthma or COPD, and patient does not smoke cigarettes.  Patient reports that she is concerned for blood clot in the chest as she has a history of PE and reports that cough is one of her main symptoms.  She currently takes Xarelto given history of DVT and PE and has been taking as prescribed.      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,  multifocal IDC, DCIS, ER/PR+,  01-09-2016 s/p bilteral mastectomies w/ left sln bx;  no chemoradiation   Cataracts, bilateral    Depression    Diabetes mellitus without complication (HCC)    type 2   GAD (generalized anxiety disorder)    Gallbladder problem    History of ovarian cyst    IBS (irritable bowel syndrome)    Joint pain    PONV (postoperative nausea and vomiting)    does well with scop patch   Retinal detachment    Rheg OS   Right knee meniscal tear    Urgency of urination     Patient Active Problem List   Diagnosis Date Noted   DVT (deep venous thrombosis) (HCC) 05/11/2022   Acute pulmonary embolism (HCC) 05/09/2022   Chest pain 05/09/2022   Elevated troponin 05/09/2022   Status  post total right knee replacement 03/31/2022   Primary osteoarthritis of right knee 03/20/2022   Eating disorder 03/20/2022   Acute medial meniscus tear, right, initial encounter 10/01/2021   At risk for dehydration 01/28/2021   At risk for malnutrition 01/01/2021   Obesity, Class I, BMI 30-34.9 12/31/2020   Vitamin B12 deficiency 10/08/2020   At risk for depression 10/08/2020   Mood disorder (HCC), with emotional eating 09/24/2020   At risk for impaired metabolic function 08/15/2020   At risk for diabetes mellitus 07/30/2020   Vitamin D deficiency 07/02/2020   Depression 07/02/2020   At risk for side effect of medication 07/02/2020   Stress due to illness of family member-  daughter with drug addiction 05/15/2020   Depression, recurrent (HCC) 05/15/2020   Anemia 05/15/2020   Constipation 05/15/2020   Prediabetes 05/15/2020   Breast asymmetry following reconstructive surgery 04/24/2020   Acquired absence of breast 04/24/2020   S/P breast reconstruction, bilateral 04/24/2020   Acute medial meniscus tear of left knee 12/20/2019   Breast cancer, left (HCC) 01/09/2016   Genetic testing 12/31/2015   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   Family history of breast cancer in female 11/08/2015   Family history of colon cancer 11/08/2015   Malignant neoplasm of central portion of left breast  in female, estrogen receptor positive (HCC) 10/31/2015   Endometriosis 03/11/2011    Past Surgical History:  Procedure Laterality Date   ABDOMINAL HYSTERECTOMY  05/1996   endometriosis   ACHILLES TENDON REPAIR Right 2010;  revision 2011   BLADDER SUSPENSION  2000   BREAST BIOPSY Left 10/2015   BREAST IMPLANT REMOVAL Bilateral 08/12/2017   Procedure: REMOVAL BILATERAL BREAST IMPLANTS;  Surgeon: Peggye Form, DO;  Location: [facility];  Service: Plastics;  Laterality: Bilateral;   BREAST RECONSTRUCTION WITH PLACEMENT OF TISSUE EXPANDER AND FLEX HD (ACELLULAR HYDRATED  DERMIS) Bilateral 01/09/2016   BREAST RECONSTRUCTION WITH PLACEMENT OF TISSUE EXPANDER AND FLEX HD (ACELLULAR HYDRATED DERMIS) Bilateral 01/09/2016   Procedure: BREAST RECONSTRUCTION WITH PLACEMENT OF TISSUE EXPANDER AND FLEX HD (ACELLULAR HYDRATED DERMIS);  Surgeon: Alena Bills Dillingham, DO;  Location: [facility];  Service: Plastics;  Laterality: Bilateral;   BREAST RECONSTRUCTION WITH PLACEMENT OF TISSUE EXPANDER AND FLEX HD (ACELLULAR HYDRATED DERMIS) Bilateral 05/29/2016   Procedure: PLACEMENT OF BILATERAL TISSUE EXPANDER AND FLEX HD (ACELLULAR HYDRATED DERMIS);  Surgeon: Peggye Form, DO;  Location: [facility];  Service: Plastics;  Laterality: Bilateral;   BREAST REDUCTION SURGERY Bilateral 11/26/2016   Procedure: BILATERAL BREAST CAPSULE CONTRACTURE RELASE;  Surgeon: Peggye Form, DO;  Location: [facility];  Service: Plastics;  Laterality: Bilateral;   CESAREAN SECTION  1987; 1989; 1991   ELBOW SURGERY Right x3   last one 02-01-2019 @ [facility]   EYE SURGERY Left 10/01/2020   Pneumatic retinopexy for repair of rheg RD - Dr. Rennis Chris   EYE SURGERY Left 10/04/2020   PPV - Dr. Rennis Chris   FAT GRAFTING BILATERAL BREAST  08-09-2018  @[facility]    GAS INSERTION Left 10/04/2020   Procedure: INSERTION OF GAS;  Surgeon: Rennis Chris, MD;  Location: [facility] OR;  Service: Ophthalmology;  Laterality: Left;   GAS/FLUID EXCHANGE Left 10/04/2020   Procedure: GAS/FLUID EXCHANGE;  Surgeon: Rennis Chris, MD;  Location: [facility] OR;  Service: Ophthalmology;  Laterality: Left;   INCISION AND DRAINAGE OF WOUND Bilateral 02/11/2016   Procedure: IRRIGATION AND DEBRIDEMENT OF BILATERAL BREAST POCKET;  Surgeon: Peggye Form, DO;  Location: [facility];  Service: Plastics;  Laterality: Bilateral;   KNEE ARTHROSCOPY WITH MEDIAL MENISECTOMY Right 12/20/2019   Procedure: KNEE ARTHROSCOPY WITH MEDIAL MENISECTOMY;  Surgeon: Teryl Lucy, MD;  Location: [facility];   Service: Orthopedics;  Laterality: Right;   KNEE ARTHROSCOPY WITH MEDIAL MENISECTOMY Right 10/30/2021   Procedure: RIGHT KNEE ARTHROSCOPY WITH PARTIAL MEDIAL MENISECTOMY SYNOVECTOMY;  Surgeon: Tarry Kos, MD;  Location: [facility];  Service: Orthopedics;  Laterality: Right;   LAPAROSCOPIC APPENDECTOMY  04-07-2011   @[facility]    w/ Excision peritoneal lipoma and lysis adhesions   LAPAROSCOPIC CHOLECYSTECTOMY  ~ 1999   LASER PHOTO ABLATION Right 10/04/2020   Procedure: LASER RETINOPEXY WITH INDIRECT LASER OPTHALMOSCOPE, RIGHT EYE;  Surgeon: Rennis Chris, MD;  Location: [facility] OR;  Service: Ophthalmology;  Laterality: Right;   LIPOSUCTION WITH LIPOFILLING Bilateral 11/26/2016   Procedure: LIPOFILLING FOR SYMMETRY;  Surgeon: Peggye Form, DO;  Location: [facility];  Service: Plastics;  Laterality: Bilateral;   LIPOSUCTION WITH LIPOFILLING Bilateral 01/21/2017   Procedure: BILATERAL BREAST  LIPOFILLING FOR ASYMMETRY;  Surgeon: Peggye Form, DO;  Location:  SURGERY CENTER;  Service: Plastics;  Laterality: Bilateral;   LIPOSUCTION WITH LIPOFILLING Bilateral 06/28/2020   Procedure: Lipofilling bilateral breasts for asymmetry;  Surgeon: Ulice Bold,  Alena Bills, DO;  Location: [facility];  Service: Plastics;  Laterality: Bilateral;  90 min   MASTECTOMY Bilateral 01/09/2016   NIPPLE SPARING MASTECTOMY/SENTINAL LYMPH NODE BIOPSY/RECONSTRUCTION/PLACEMENT OF TISSUE EXPANDER Bilateral 01/09/2016   Procedure: BILATERAL NIPPLE SPARING MASTECTOMY WITH LEFT SENTINAL LYMPH NODE BIOPSY ;  Surgeon: Almond Lint, MD;  Location: [facility];  Service: General;  Laterality: Bilateral;   PHOTOCOAGULATION WITH LASER Left 10/04/2020   Procedure: PHOTOCOAGULATION WITH LASER;  Surgeon: Rennis Chris, MD;  Location: [facility] OR;  Service: Ophthalmology;  Laterality: Left;   REMOVAL OF BILATERAL TISSUE EXPANDERS WITH PLACEMENT OF BILATERAL BREAST IMPLANTS Bilateral 08/20/2016   Procedure:  REMOVAL OF BILATERAL TISSUE EXPANDERS WITH PLACEMENT OF BILATERAL SILICONE IMPLANTS;  Surgeon: Peggye Form, DO;  Location: [facility];  Service: Plastics;  Laterality: Bilateral;   REMOVAL OF BILATERAL TISSUE EXPANDERS WITH PLACEMENT OF BILATERAL BREAST IMPLANTS Bilateral 11/05/2017   Procedure: REMOVAL OF BILATERAL TISSUE EXPANDERS WITH PLACEMENT OF BILATERAL BREAST SILICONE IMPLANTS;  Surgeon: Peggye Form, DO;  Location: [facility];  Service: Plastics;  Laterality: Bilateral;   REMOVAL OF TISSUE EXPANDER Bilateral 02/11/2016   Procedure: REMOVAL OF BILATERAL TISSUE EXPANDERS AND FLEX HD REMOVAL;  Surgeon: Peggye Form, DO;  Location: [facility];  Service: Plastics;  Laterality: Bilateral;   RETINAL DETACHMENT SURGERY Left 10/01/2020   Pneumatic retinopexy for repair of rheg RD - Dr. Rennis Chris   RETINAL DETACHMENT SURGERY Left 10/04/2020   PPV - Dr. Rennis Chris   SCLERAL BUCKLE Left 10/04/2020   Procedure: SCLERAL BUCKLE LEFT EYE;  Surgeon: Rennis Chris, MD;  Location: [facility] OR;  Service: Ophthalmology;  Laterality: Left;   TISSUE EXPANDER PLACEMENT Bilateral 08/12/2017   Procedure: PLACEMENT OF BILATERAL TISSUE EXPANDER;  Surgeon: Peggye Form, DO;  Location: [facility];  Service: Plastics;  Laterality: Bilateral;   TOTAL KNEE ARTHROPLASTY Right 03/31/2022   Procedure: RIGHT TOTAL KNEE REPLACEMENT;  Surgeon: Tarry Kos, MD;  Location: [facility];  Service: Orthopedics;  Laterality: Right;   TUBAL LIGATION Bilateral 1991   VITRECTOMY 25 GAUGE WITH SCLERAL BUCKLE Left 10/04/2020   Procedure: 25 GAUGE PARS PLANA VITRECTOMY LEFT EYE ;  Surgeon: Rennis Chris, MD;  Location: [facility] OR;  Service: Ophthalmology;  Laterality: Left;    OB History     Gravida  4   Para  3   Term      Preterm      AB      Living  3      SAB      IAB      Ectopic      Multiple      Live Births               Home  Medications    Prior to Admission medications   Medication Sig Start Date End Date Taking? Authorizing Provider  benzonatate (TESSALON) 100 MG capsule Take 1 capsule (100 mg total) by mouth every 8 (eight) hours as needed for cough. 01/13/23  Yes , Rolly Salter E, FNP  doxycycline (VIBRAMYCIN) 100 MG capsule Take 1 capsule (100 mg total) by mouth 2 (two) times daily. 01/13/23  Yes , Rolly Salter E, FNP  predniSONE (DELTASONE) 20 MG tablet Take 2 tablets (40 mg total) by mouth daily for 5 days. 01/13/23 01/18/23 Yes , Acie Fredrickson, FNP  amitriptyline (ELAVIL) 150 MG tablet Take 150 mg by mouth at bedtime. 02/20/22   [provider]  Jalene Mullet  2 MG TABS tablet Take 2 mg by mouth at bedtime. 03/09/22   [provider]  RIVAROXABAN Carlena Hurl) VTE STARTER PACK (15 & 20 MG) Follow package directions: Take one 15mg  tablet by mouth twice a day. On day 22, switch to one 20mg  tablet once a day. Take with food. 05/12/22   [place], Alvira Philips, DO  traMADol (ULTRAM) 50 MG tablet Take 1 tablet (50 mg total) by mouth every 12 (twelve) hours as needed. 10/14/22   Cristie Hem, PA-C    Family History Family History  Problem Relation Age of Onset   Heart failure Father    Prostate cancer Father 51   Retinal detachment Father    Colon polyps Mother        approx 2   Other Mother        hx HPV and hysterectomy due to precancerous cells   Depression Mother    Anxiety disorder Mother    Other Sister 29       hx of hysterectomy for unspecified reason; still has ovaries   Other Sister 27       paternal half-sister hx of hysterectomy for unspecified reason; still has ovaries   Bladder Cancer Maternal Uncle 62       not a smoker   Kidney failure Maternal Grandmother    Congestive Heart Failure Maternal Grandmother    Colon cancer Maternal Grandmother 25   Diabetes Maternal Grandmother    Lung cancer Maternal Grandfather 20       smoker   Breast cancer Paternal Grandmother        dx. early 68s; w/ hx of  trauma to breast   Crohn's disease Daughter    Lung cancer Maternal Uncle 15       smoker    Social History Social History   Tobacco Use   Smoking status: Former    Packs/day: 1.00    Years: 10.00    Additional pack years: 0.00    Total pack years: 10.00    Types: Cigarettes    Quit date: 06/08/2008    Years since quitting: 14.6   Smokeless tobacco: Never  Vaping Use   Vaping Use: Never used  Substance Use Topics   Alcohol use: No   Drug use: Never     Allergies   Patient has no known allergies.   Review of Systems Review of Systems Per HPI  Physical Exam Triage Vital Signs ED Triage Vitals  Enc Vitals Group     BP 01/13/23 0910 (!) 148/82     Pulse Rate 01/13/23 0910 85     Resp 01/13/23 0910 18     Temp 01/13/23 0910 97.9 F (36.6 C)     Temp Source 01/13/23 0910 Oral     SpO2 01/13/23 0910 95 %     Weight --      Height --      Head Circumference --      Peak Flow --      Pain Score 01/13/23 0911 0     Pain Loc --      Pain Edu? --      Excl. in GC? --    No data found.  Updated Vital Signs BP (!) 148/82 (BP Location: Right Arm)   Pulse 85   Temp 97.9 F (36.6 C) (Oral)   Resp 18   LMP  (LMP Unknown)   SpO2 95%   Visual Acuity Right Eye Distance:   Left Eye Distance:  Bilateral Distance:    Right Eye Near:   Left Eye Near:    Bilateral Near:     Physical Exam Constitutional:      General: She is not in acute distress.    Appearance: Normal appearance. She is not toxic-appearing or diaphoretic.  HENT:     Head: Normocephalic and atraumatic.     Right Ear: Tympanic membrane and ear canal normal.     Left Ear: Tympanic membrane and ear canal normal.     Nose: Congestion present.     Mouth/Throat:     Mouth: Mucous membranes are moist.     Pharynx: No posterior oropharyngeal erythema.  Eyes:     Extraocular Movements: Extraocular movements intact.     Conjunctiva/sclera: Conjunctivae normal.     Pupils: Pupils are equal, round,  and reactive to light.  Cardiovascular:     Rate and Rhythm: Normal rate and regular rhythm.     Pulses: Normal pulses.     Heart sounds: Normal heart sounds.  Pulmonary:     Effort: Pulmonary effort is normal. No respiratory distress.     Breath sounds: Normal breath sounds. No stridor. No wheezing, rhonchi or rales.  Abdominal:     General: Abdomen is flat. Bowel sounds are normal.     Palpations: Abdomen is soft.  Musculoskeletal:        General: Normal range of motion.     Cervical back: Normal range of motion.  Skin:    General: Skin is warm and dry.  Neurological:     General: No focal deficit present.     Mental Status: She is alert and oriented to person, place, and time. Mental status is at baseline.  Psychiatric:        Mood and Affect: Mood normal.        Behavior: Behavior normal.      UC Treatments / Results  Labs (all labs ordered are listed, but only abnormal results are displayed) Labs Reviewed - No data to display  EKG   Radiology No results found.  Procedures Procedures (including critical care time)  Medications Ordered in UC Medications - No data to display  Initial Impression / Assessment and Plan / UC Course  I have reviewed the triage vital signs and the nursing notes.  Pertinent labs & imaging results that were available during my care of the patient were reviewed by me and considered in my medical decision making (see chart for details).     I have a very low suspicion or concern for PE at this time given patient has been taking Xarelto as prescribed and symptoms started off as viral symptoms.  Therefore, do not think that CT imaging or emergent evaluation is necessary at this time.  Suspect bronchitis versus postviral cough.  Given nasal congestion has been intermittent, will opt to treat with doxycycline for secondary bacterial infection.  Benzonatate prescribed to take as needed for cough.  Prednisone prescribed to decrease inflammation in  chest.  Patient has taken prednisone before and tolerated well.  Patient reports she is in remission for breast cancer so this should be safe.  No other obvious contraindications to prednisone noted in patient's history.  Suggested to patient chest x-ray given persistent cough and she was agreeable.  Do not have x-ray tech here in urgent care so outpatient imaging was ordered at [facility] imaging.  Awaiting results.  Discussed strict return and ER precautions.  Patient verbalized understanding and was agreeable with plan. Final  Clinical Impressions(s) / UC Diagnoses   Final diagnoses:  Persistent cough for 3 weeks or longer  Nasal congestion     Discharge Instructions      Please go to [facility] imaging today to receive chest x-ray.  I will call you if any results are abnormal.  I have prescribed you 3 medications to help alleviate symptoms.  Follow-up if any symptoms persist or worsen.    ED Prescriptions     Medication Sig Dispense Auth. Provider   predniSONE (DELTASONE) 20 MG tablet Take 2 tablets (40 mg total) by mouth daily for 5 days. 10 tablet [place], [place] E, [place]   benzonatate (TESSALON) 100 MG capsule Take 1 capsule (100 mg total) by mouth every 8 (eight) hours as needed for cough. 21 capsule [place], [place] E, [place]   doxycycline (VIBRAMYCIN) 100 MG capsule Take 1 capsule (100 mg total) by mouth 2 (two) times daily. 20 capsule Gustavus Bryant, [place]      PDMP not reviewed this encounter.   Gustavus Bryant, [place] 01/13/23 [phone]

## 2023-01-20 ENCOUNTER — Ambulatory Visit: Payer: 59 | Admitting: Physician Assistant

## 2023-01-28 ENCOUNTER — Ambulatory Visit: Payer: 59 | Admitting: Family Medicine

## 2023-02-12 ENCOUNTER — Ambulatory Visit (INDEPENDENT_AMBULATORY_CARE_PROVIDER_SITE_OTHER): Payer: 59 | Admitting: Family Medicine

## 2023-02-12 ENCOUNTER — Encounter: Payer: Self-pay | Admitting: Family Medicine

## 2023-02-12 VITALS — BP 137/85 | HR 68 | Resp 18 | Ht 67.0 in | Wt 218.0 lb

## 2023-02-12 DIAGNOSIS — R631 Polydipsia: Secondary | ICD-10-CM

## 2023-02-12 DIAGNOSIS — L299 Pruritus, unspecified: Secondary | ICD-10-CM | POA: Insufficient documentation

## 2023-02-12 DIAGNOSIS — E669 Obesity, unspecified: Secondary | ICD-10-CM

## 2023-02-12 DIAGNOSIS — F5101 Primary insomnia: Secondary | ICD-10-CM | POA: Insufficient documentation

## 2023-02-12 DIAGNOSIS — G4700 Insomnia, unspecified: Secondary | ICD-10-CM | POA: Diagnosis not present

## 2023-02-12 DIAGNOSIS — R5383 Other fatigue: Secondary | ICD-10-CM

## 2023-02-12 DIAGNOSIS — R3589 Other polyuria: Secondary | ICD-10-CM

## 2023-02-12 DIAGNOSIS — R202 Paresthesia of skin: Secondary | ICD-10-CM

## 2023-02-12 DIAGNOSIS — R200 Anesthesia of skin: Secondary | ICD-10-CM | POA: Insufficient documentation

## 2023-02-12 MED ORDER — TRAZODONE HCL 50 MG PO TABS
ORAL_TABLET | ORAL | 1 refills | Status: DC
Start: 2023-02-12 — End: 2023-03-17

## 2023-02-12 NOTE — Assessment & Plan Note (Signed)
Referral for allergy testing. Sister has hx Celiac disease, patient does not notice any increase in pruritus with certain foods including gluten.

## 2023-02-12 NOTE — Progress Notes (Signed)
New Patient Office Visit  Subjective    Patient ID: Christina [name], female    DOB: September 18, 1965  Age: 57 y.o. MRN: [id number]  CC:  Chief Complaint  Patient presents with   Establish Care    Fasting   Fatigue   Tingling    Right hand    Foot Pain    Right     HPI Christina [name] presents to establish care. Past medical history includes PE and DVT, previously took Xarelto for 6 months. History of breast cancer with mastectomy. History of depression, has not been on any medication for a long time. She was seeing weight management last year, she states they were watching her A1C. Now, she is worried that she may have diabetes. She has been experiencing fatigue, nausea, and headache alleviated by eating food. She also has constant toe numbness with R>L, and intermittent right hand tingling. She wakes up to urinate 3-4 times nightly and endorses increased thirst. She also endorses constant itching all over her body, she cannot identify any specific triggers. She is taking amitriptyline for sleep, but finds it is not effective for her.  Outpatient Encounter Medications as of 02/12/2023  Medication Sig   traZODone (DESYREL) 50 MG tablet Take 1/2-1 tablet (25-50 mg total) by mouth at bedtime as needed for sleep.   [DISCONTINUED] amitriptyline (ELAVIL) 150 MG tablet Take 150 mg by mouth at bedtime.   [DISCONTINUED] benzonatate (TESSALON) 100 MG capsule Take 1 capsule (100 mg total) by mouth every 8 (eight) hours as needed for cough.   [DISCONTINUED] doxycycline (VIBRAMYCIN) 100 MG capsule Take 1 capsule (100 mg total) by mouth 2 (two) times daily.   [DISCONTINUED] REXULTI 2 MG TABS tablet Take 2 mg by mouth at bedtime.   [DISCONTINUED] RIVAROXABAN (XARELTO) VTE STARTER PACK (15 & 20 MG) Follow package directions: Take one 15mg  tablet by mouth twice a day. On day 22, switch to one 20mg  tablet once a day. Take with food.   [DISCONTINUED] traMADol (ULTRAM) 50 MG tablet Take 1 tablet (50 mg total) by  mouth every 12 (twelve) hours as needed.   No facility-administered encounter medications on file as of 02/12/2023.    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,  multifocal IDC, DCIS, ER/PR+,  01-09-2016 s/p bilteral mastectomies w/ left sln bx;  no chemoradiation   Cataracts, bilateral    Depression    Diabetes mellitus without complication (HCC)    type 2   GAD (generalized anxiety disorder)    Gallbladder problem    History of ovarian cyst    IBS (irritable bowel syndrome)    Joint pain    PONV (postoperative nausea and vomiting)    does well with scop patch   Retinal detachment    Rheg OS   Right knee meniscal tear    Urgency of urination     Past Surgical History:  Procedure Laterality Date   ABDOMINAL HYSTERECTOMY  05/1996   endometriosis   ACHILLES TENDON REPAIR Right 2010;  revision 2011   BLADDER SUSPENSION  2000   BREAST BIOPSY Left 10/2015   BREAST IMPLANT REMOVAL Bilateral 08/12/2017   Procedure: REMOVAL BILATERAL BREAST IMPLANTS;  Surgeon: Peggye Form, DO;  Location: [facility];  Service: Plastics;  Laterality: Bilateral;   BREAST RECONSTRUCTION WITH PLACEMENT OF  TISSUE EXPANDER AND FLEX HD (ACELLULAR HYDRATED DERMIS) Bilateral 01/09/2016   BREAST RECONSTRUCTION WITH PLACEMENT OF TISSUE EXPANDER AND FLEX HD (ACELLULAR HYDRATED DERMIS) Bilateral 01/09/2016   Procedure: BREAST RECONSTRUCTION WITH PLACEMENT OF TISSUE EXPANDER AND FLEX HD (ACELLULAR HYDRATED DERMIS);  Surgeon: Alena Bills Dillingham, DO;  Location: [facility];  Service: Plastics;  Laterality: Bilateral;   BREAST RECONSTRUCTION WITH PLACEMENT OF TISSUE EXPANDER AND FLEX HD (ACELLULAR HYDRATED DERMIS) Bilateral 05/29/2016   Procedure: PLACEMENT OF BILATERAL TISSUE EXPANDER AND FLEX HD (ACELLULAR HYDRATED DERMIS);  Surgeon: Peggye Form, DO;  Location: [facility];  Service: Plastics;  Laterality: Bilateral;   BREAST REDUCTION SURGERY Bilateral 11/26/2016   Procedure: BILATERAL BREAST CAPSULE CONTRACTURE RELASE;  Surgeon: Peggye Form, DO;  Location: [facility];  Service: Plastics;  Laterality: Bilateral;   CESAREAN SECTION  1987; 1989; 1991   ELBOW SURGERY Right x3   last one 02-01-2019 @ [facility]   EYE SURGERY Left 10/01/2020   Pneumatic retinopexy for repair of rheg RD - Dr. Rennis Chris   EYE SURGERY Left 10/04/2020   PPV - Dr. Rennis Chris   FAT GRAFTING BILATERAL BREAST  08-09-2018  @[facility]    GAS INSERTION Left 10/04/2020   Procedure: INSERTION OF GAS;  Surgeon: Rennis Chris, MD;  Location: [facility] OR;  Service: Ophthalmology;  Laterality: Left;   GAS/FLUID EXCHANGE Left 10/04/2020   Procedure: GAS/FLUID EXCHANGE;  Surgeon: Rennis Chris, MD;  Location: [facility] OR;  Service: Ophthalmology;  Laterality: Left;   INCISION AND DRAINAGE OF WOUND Bilateral 02/11/2016   Procedure: IRRIGATION AND DEBRIDEMENT OF BILATERAL BREAST POCKET;  Surgeon: Peggye Form, DO;  Location: [facility];  Service: Plastics;  Laterality: Bilateral;   KNEE ARTHROSCOPY WITH MEDIAL MENISECTOMY Right 12/20/2019   Procedure: KNEE ARTHROSCOPY WITH MEDIAL MENISECTOMY;  Surgeon: Teryl Lucy, MD;  Location: [facility];  Service: Orthopedics;  Laterality: Right;   KNEE ARTHROSCOPY WITH MEDIAL MENISECTOMY Right 10/30/2021   Procedure: RIGHT KNEE ARTHROSCOPY WITH PARTIAL MEDIAL MENISECTOMY SYNOVECTOMY;  Surgeon: Tarry Kos, MD;  Location: [facility];  Service: Orthopedics;  Laterality: Right;   LAPAROSCOPIC APPENDECTOMY  04-07-2011   @[facility]    w/ Excision peritoneal lipoma and lysis adhesions   LAPAROSCOPIC CHOLECYSTECTOMY  ~ 1999   LASER PHOTO ABLATION Right 10/04/2020   Procedure: LASER RETINOPEXY WITH INDIRECT LASER OPTHALMOSCOPE, RIGHT EYE;  Surgeon: Rennis Chris, MD;  Location: [facility];  Service: Ophthalmology;   Laterality: Right;   LIPOSUCTION WITH LIPOFILLING Bilateral 11/26/2016   Procedure: LIPOFILLING FOR SYMMETRY;  Surgeon: Peggye Form, DO;  Location: [facility];  Service: Plastics;  Laterality: Bilateral;   LIPOSUCTION WITH LIPOFILLING Bilateral 01/21/2017   Procedure: BILATERAL BREAST  LIPOFILLING FOR ASYMMETRY;  Surgeon: Peggye Form, DO;  Location: [facility];  Service: Plastics;  Laterality: Bilateral;   LIPOSUCTION WITH LIPOFILLING Bilateral 06/28/2020   Procedure: Lipofilling bilateral breasts for asymmetry;  Surgeon: Peggye Form, DO;  Location: [facility];  Service: Plastics;  Laterality: Bilateral;  90 min   MASTECTOMY Bilateral 01/09/2016   NIPPLE SPARING MASTECTOMY/SENTINAL LYMPH NODE BIOPSY/RECONSTRUCTION/PLACEMENT OF TISSUE EXPANDER Bilateral 01/09/2016   Procedure: BILATERAL NIPPLE SPARING MASTECTOMY WITH LEFT SENTINAL LYMPH NODE BIOPSY ;  Surgeon: Almond Lint, MD;  Location: [facility];  Service: General;  Laterality: Bilateral;   PHOTOCOAGULATION WITH LASER Left 10/04/2020   Procedure: PHOTOCOAGULATION WITH LASER;  Surgeon: Rennis Chris, MD;  Location: [facility] OR;  Service: Ophthalmology;  Laterality: Left;   REMOVAL OF BILATERAL TISSUE EXPANDERS WITH PLACEMENT OF BILATERAL BREAST IMPLANTS Bilateral 08/20/2016   Procedure: REMOVAL OF BILATERAL TISSUE EXPANDERS WITH PLACEMENT OF BILATERAL SILICONE IMPLANTS;  Surgeon: Peggye Form, DO;  Location: [facility];  Service: Plastics;  Laterality: Bilateral;   REMOVAL OF BILATERAL TISSUE EXPANDERS WITH PLACEMENT OF BILATERAL BREAST IMPLANTS Bilateral 11/05/2017   Procedure: REMOVAL OF BILATERAL TISSUE EXPANDERS WITH PLACEMENT OF BILATERAL BREAST SILICONE IMPLANTS;  Surgeon: Peggye Form, DO;  Location: [facility];  Service: Plastics;  Laterality: Bilateral;   REMOVAL OF TISSUE EXPANDER Bilateral 02/11/2016   Procedure: REMOVAL OF BILATERAL TISSUE  EXPANDERS AND FLEX HD REMOVAL;  Surgeon: Peggye Form, DO;  Location: [facility];  Service: Plastics;  Laterality: Bilateral;   RETINAL DETACHMENT SURGERY Left 10/01/2020   Pneumatic retinopexy for repair of rheg RD - Dr. Rennis Chris   RETINAL DETACHMENT SURGERY Left 10/04/2020   PPV - Dr. Rennis Chris   SCLERAL BUCKLE Left 10/04/2020   Procedure: SCLERAL BUCKLE LEFT EYE;  Surgeon: Rennis Chris, MD;  Location: [facility];  Service: Ophthalmology;  Laterality: Left;   TISSUE EXPANDER PLACEMENT Bilateral 08/12/2017   Procedure: PLACEMENT OF BILATERAL TISSUE EXPANDER;  Surgeon: Peggye Form, DO;  Location: [facility];  Service: Plastics;  Laterality: Bilateral;   TOTAL KNEE ARTHROPLASTY Right 03/31/2022   Procedure: RIGHT TOTAL KNEE REPLACEMENT;  Surgeon: Tarry Kos, MD;  Location: [facility];  Service: Orthopedics;  Laterality: Right;   TUBAL LIGATION Bilateral 1991   VITRECTOMY 25 GAUGE WITH SCLERAL BUCKLE Left 10/04/2020   Procedure: 25 GAUGE PARS PLANA VITRECTOMY LEFT EYE ;  Surgeon: Rennis Chris, MD;  Location: [facility];  Service: Ophthalmology;  Laterality: Left;    Family History  Problem Relation Age of Onset   Heart failure Father    Prostate cancer Father 50   Retinal detachment Father    Colon polyps Mother        approx 2   Other Mother        hx HPV and hysterectomy due to precancerous cells   Depression Mother    Anxiety disorder Mother    Other Sister 36       hx of hysterectomy for unspecified reason; still has ovaries   Other Sister 42       paternal half-sister hx of hysterectomy for unspecified reason; still has ovaries   Bladder Cancer Maternal Uncle 59       not a smoker   Kidney failure Maternal Grandmother    Congestive Heart Failure Maternal Grandmother    Colon cancer Maternal Grandmother 12   Diabetes Maternal Grandmother    Lung cancer Maternal Grandfather 81       smoker   Breast cancer Paternal Grandmother        dx.  early 59s; w/ hx of trauma to breast   Crohn's disease Daughter    Lung cancer Maternal Uncle 35       smoker    Social History   Socioeconomic History   Marital status: Married    Spouse name: Not on file   Number of children: Not on file   Years of education: Not on file   Highest education level: Not on file  Occupational History   Occupation: Cabin crew: Astronomer  Tobacco Use   Smoking status: Former    Packs/day: 1.00    Years: 10.00  Additional pack years: 0.00    Total pack years: 10.00    Types: Cigarettes    Quit date: 06/08/2008    Years since quitting: 14.6    Passive exposure: Never   Smokeless tobacco: Never  Vaping Use   Vaping Use: Never used  Substance and Sexual Activity   Alcohol use: No   Drug use: Never   Sexual activity: Never    Birth control/protection: Surgical    Comment: hysterectomy  Other Topics Concern   Not on file  Social History Narrative   Not on file   Social Determinants of Health   Financial Resource Strain: Not on file  Food Insecurity: Not on file  Transportation Needs: Not on file  Physical Activity: Not on file  Stress: Not on file  Social Connections: Not on file  Intimate Partner Violence: Not on file    Review of Systems  Constitutional:  Positive for malaise/fatigue. Negative for chills, diaphoresis, fever and weight loss.  HENT:  Negative for congestion and hearing loss.   Eyes:  Negative for blurred vision.  Respiratory:  Negative for cough and shortness of breath.   Cardiovascular:  Negative for chest pain and palpitations.  Gastrointestinal:  Positive for nausea. Negative for abdominal pain, heartburn and vomiting.  Musculoskeletal:  Negative for myalgias and neck pain.  Skin:  Positive for itching. Negative for rash.  Neurological:  Positive for tingling and sensory change.  Endo/Heme/Allergies:  Positive for polydipsia (and polyuria).  Psychiatric/Behavioral:  Negative for  depression. The patient has insomnia. The patient is not nervous/anxious.     Objective    BP 137/85 (BP Location: Left Arm, Patient Position: Sitting, Cuff Size: Normal)   Pulse 68   Resp 18   Ht 5\' 7"  (1.702 m)   Wt 218 lb (98.9 kg)   LMP  (LMP Unknown)   SpO2 97%   BMI 34.14 kg/m   Physical Exam Constitutional:      General: She is not in acute distress.    Appearance: Normal appearance. She is not ill-appearing.  HENT:     Head: Normocephalic and atraumatic.     Right Ear: Tympanic membrane, ear canal and external ear normal. There is no impacted cerumen.     Left Ear: Tympanic membrane, ear canal and external ear normal. There is no impacted cerumen.     Nose: Nose normal. No congestion or rhinorrhea.     Mouth/Throat:     Mouth: Mucous membranes are moist.     Pharynx: Oropharynx is clear. No oropharyngeal exudate or posterior oropharyngeal erythema.  Eyes:     General:        Right eye: No discharge.        Left eye: No discharge.     Extraocular Movements: Extraocular movements intact.     Conjunctiva/sclera: Conjunctivae normal.     Pupils: Pupils are equal, round, and reactive to light.  Cardiovascular:     Rate and Rhythm: Normal rate and regular rhythm.     Pulses: Normal pulses.     Heart sounds: No murmur heard.    No friction rub. No gallop.  Pulmonary:     Effort: Pulmonary effort is normal. No respiratory distress.     Breath sounds: Normal breath sounds. No wheezing, rhonchi or rales.  Musculoskeletal:        General: No swelling.     Right lower leg: No edema.     Left lower leg: No edema.  Skin:  General: Skin is warm and dry.     Findings: No rash.  Neurological:     Mental Status: She is alert and oriented to person, place, and time.     Assessment & Plan:  Obesity (BMI 30.0-34.9) -     CBC with Differential/Platelet; Future -     Comprehensive metabolic panel; Future -     Hemoglobin A1c; Future -     Lipid panel; Future -     VITAMIN  D 25 Hydroxy (Vit-D Deficiency, Fractures); Future -     TSH; Future -     T4, free; Future  Fatigue, unspecified type -     VITAMIN D 25 Hydroxy (Vit-D Deficiency, Fractures); Future -     TSH; Future -     T4, free; Future  Insomnia, unspecified type Assessment & Plan: Stop amitriptyline, start trazodone 25-50mg  nightly for sleep. Will discuss efficacy at CPE in 4 weeks.  Orders: -     traZODone HCl; Take 1/2-1 tablet (25-50 mg total) by mouth at bedtime as needed for sleep.  Dispense: 30 tablet; Refill: 1  Pruritus Assessment & Plan: Referral for allergy testing. Sister has hx Celiac disease, patient does not notice any increase in pruritus with certain foods including gluten.  Orders: -     Ambulatory referral to Allergy  Polyuria  Polydipsia  Numbness of toes  Numbness and tingling in right hand  Starting with baseline labs to investigate causes of numbness/tingling, fatigue, polyuria/polydipsia, pruritus. Based on our records, last A1c was 6.7 which does meet criteria for DM. We discussed that if repeat A1C meets DM criteria, we will need to follow up as well as discuss routine care for patients with diabetes including eye exam, foot exam, urine, etc. Patient verbalized understanding and is agreeable.  Return in about 4 weeks (around 03/12/2023) for annual physical. Use fasting lab results from today.  [name], PA

## 2023-02-12 NOTE — Assessment & Plan Note (Signed)
Stop amitriptyline, start trazodone 25-50mg  nightly for sleep. Will discuss efficacy at CPE in 4 weeks.

## 2023-02-13 ENCOUNTER — Telehealth: Payer: Self-pay | Admitting: *Deleted

## 2023-02-13 LAB — COMPREHENSIVE METABOLIC PANEL
ALT: 88 IU/L — ABNORMAL HIGH (ref 0–32)
AST: 59 IU/L — ABNORMAL HIGH (ref 0–40)
Albumin/Globulin Ratio: 1.5 (ref 1.2–2.2)
Albumin: 4.3 g/dL (ref 3.8–4.9)
Alkaline Phosphatase: 76 IU/L (ref 44–121)
BUN/Creatinine Ratio: 29 — ABNORMAL HIGH (ref 9–23)
BUN: 21 mg/dL (ref 6–24)
Bilirubin Total: 0.4 mg/dL (ref 0.0–1.2)
CO2: 20 mmol/L (ref 20–29)
Calcium: 10.5 mg/dL — ABNORMAL HIGH (ref 8.7–10.2)
Chloride: 108 mmol/L — ABNORMAL HIGH (ref 96–106)
Creatinine, Ser: 0.72 mg/dL (ref 0.57–1.00)
Globulin, Total: 2.9 g/dL (ref 1.5–4.5)
Glucose: 116 mg/dL — ABNORMAL HIGH (ref 70–99)
Potassium: 4.4 mmol/L (ref 3.5–5.2)
Sodium: 142 mmol/L (ref 134–144)
Total Protein: 7.2 g/dL (ref 6.0–8.5)
eGFR: 99 mL/min/{1.73_m2} (ref >59)

## 2023-02-13 LAB — LIPID PANEL
Chol/HDL Ratio: 3.6 ratio (ref 0.0–4.4)
Cholesterol, Total: 129 mg/dL (ref 100–199)
HDL: 36 mg/dL — ABNORMAL LOW (ref >39)
LDL Chol Calc (NIH): 78 mg/dL (ref 0–99)
Triglycerides: 76 mg/dL (ref 0–149)
VLDL Cholesterol Cal: 15 mg/dL (ref 5–40)

## 2023-02-13 LAB — CBC WITH DIFFERENTIAL/PLATELET
Basophils Absolute: 0 10*3/uL (ref 0.0–0.2)
Basos: 0 %
EOS (ABSOLUTE): 0.2 10*3/uL (ref 0.0–0.4)
Eos: 2 %
Hematocrit: 42.7 % (ref 34.0–46.6)
Hemoglobin: 14.4 g/dL (ref 11.1–15.9)
Immature Grans (Abs): 0 10*3/uL (ref 0.0–0.1)
Immature Granulocytes: 0 %
Lymphocytes Absolute: 2.3 10*3/uL (ref 0.7–3.1)
Lymphs: 32 %
MCH: 27.6 pg (ref 26.6–33.0)
MCHC: 33.7 g/dL (ref 31.5–35.7)
MCV: 82 fL (ref 79–97)
Monocytes Absolute: 0.6 10*3/uL (ref 0.1–0.9)
Monocytes: 9 %
Neutrophils Absolute: 4 10*3/uL (ref 1.4–7.0)
Neutrophils: 57 %
Platelets: 338 10*3/uL (ref 150–450)
RBC: 5.22 x10E6/uL (ref 3.77–5.28)
RDW: 14.4 % (ref 11.7–15.4)
WBC: 7.1 10*3/uL (ref 3.4–10.8)

## 2023-02-13 LAB — VITAMIN D 25 HYDROXY (VIT D DEFICIENCY, FRACTURES): Vit D, 25-Hydroxy: 22 ng/mL — ABNORMAL LOW (ref 30.0–100.0)

## 2023-02-13 LAB — TSH: TSH: 2.27 u[IU]/mL (ref 0.450–4.500)

## 2023-02-13 LAB — HEMOGLOBIN A1C
Est. average glucose Bld gHb Est-mCnc: 146 mg/dL
Hgb A1c MFr Bld: 6.7 % — ABNORMAL HIGH (ref 4.8–5.6)

## 2023-02-13 LAB — T4, FREE: Free T4: 1.49 ng/dL (ref 0.82–1.77)

## 2023-02-13 NOTE — Telephone Encounter (Signed)
Pt called and I informed her of the message about her labs from provider.

## 2023-02-23 ENCOUNTER — Telehealth: Payer: Self-pay | Admitting: Physician Assistant

## 2023-02-23 NOTE — Telephone Encounter (Signed)
Spoke w/the patient, she had rt knee surgery last July, it's swollen from the knee all the way down, she wants an appt with Dub Mikes tomorrow and I see she's off the end of the week.  [phone]

## 2023-02-24 ENCOUNTER — Ambulatory Visit (INDEPENDENT_AMBULATORY_CARE_PROVIDER_SITE_OTHER): Payer: 59 | Admitting: Orthopaedic Surgery

## 2023-02-24 ENCOUNTER — Other Ambulatory Visit (INDEPENDENT_AMBULATORY_CARE_PROVIDER_SITE_OTHER): Payer: 59

## 2023-02-24 DIAGNOSIS — M7051 Other bursitis of knee, right knee: Secondary | ICD-10-CM | POA: Diagnosis not present

## 2023-02-24 DIAGNOSIS — Z96651 Presence of right artificial knee joint: Secondary | ICD-10-CM | POA: Diagnosis not present

## 2023-02-24 MED ORDER — BUPIVACAINE HCL 0.25 % IJ SOLN
2.0000 mL | INTRAMUSCULAR | Status: AC | PRN
Start: 2023-02-24 — End: 2023-02-24
  Administered 2023-02-24: 2 mL via INTRA_ARTICULAR

## 2023-02-24 MED ORDER — LIDOCAINE HCL 1 % IJ SOLN
2.0000 mL | INTRAMUSCULAR | Status: AC | PRN
Start: 2023-02-24 — End: 2023-02-24
  Administered 2023-02-24: 2 mL

## 2023-02-24 MED ORDER — METHYLPREDNISOLONE ACETATE 40 MG/ML IJ SUSP
40.0000 mg | INTRAMUSCULAR | Status: AC | PRN
Start: 2023-02-24 — End: 2023-02-24
  Administered 2023-02-24: 40 mg via INTRA_ARTICULAR

## 2023-02-24 NOTE — Progress Notes (Signed)
Post-Op Visit Note   Patient: Christina [name]           Date of Birth: 1966-04-05           MRN: [id number] Visit Date: 02/24/2023 PCP: [name], PA   Assessment & Plan:  Chief Complaint:  Chief Complaint  Patient presents with   Right Knee - Pain   Visit Diagnoses:  1. H/O total knee replacement, right     Plan: Patient is a pleasant 57 year old female who comes in today approximately 11 months status post right total knee replacement, date of surgery 03/31/2022.  She has had all been on pain to the medial knee since surgery.  There is questionable micromotion to the medial tibial tray at her last visit.  She was doing relatively well following that visit but still with baseline discomfort.  Her pain increased about 4 days ago.  She denies any injury or change in activity.  The pain is still to the medial aspect and is constant.  Worse with walking, standing or flexion of the knee.  She has been taking Tylenol without significant relief.  No fevers or chills.  Of note, she did develop postop DVT and was on Xarelto until March.  She is still wearing compression socks at times to the right lower extremity to help with the swelling.  Examination of her right knee reveals a small effusion.  Range of motion 0 to 100 degrees.  She does have marked tenderness to the pes bursa in addition to the medial joint line.  She is stable to valgus and varus stress.  She is neurovascularly intact distally.  At this point, I feel as though her symptoms are coming primarily from her pes bursa despite the lucency around the medial tibial tray on x-ray.  X-rays are stable and do not show any interval change from the past several months.  We have discussed proceeding with right knee pes bursa injection for which she is agreeable to.   Procedure Note  Patient: Christina [name]             Date of Birth: Jul 28, 1966           MRN: [id number]             Visit Date: 02/24/2023  Procedures: Visit  Diagnoses:  1. H/O total knee replacement, right     Large Joint Inj: R knee on 02/24/2023 10:52 AM Indications: pain Details: 22 G needle, anterolateral approach Medications: 2 mL lidocaine 1 %; 2 mL bupivacaine 0.25 %; 40 mg methylPREDNISolone acetate 40 MG/ML       We will have her follow-up with [name] in 1 year for recheck and repeat two-view x-rays of the right knee.  She will call us with any concerns or questions in the meantime.  Follow-Up Instructions: Return in about 1 year (around 02/24/2024).   Orders:  Orders Placed This Encounter  Procedures   XR KNEE 3 VIEW RIGHT   No orders of the defined types were placed in this encounter.   Imaging: XR KNEE 3 VIEW RIGHT  Result Date: 02/24/2023 X-rays demonstrate persistent lucency to the medial tibial tray without interval change   PMFS History: Patient Active Problem List   Diagnosis Date Noted   Numbness and tingling in right hand 02/12/2023   Numbness of toes 02/12/2023   Pruritus 02/12/2023   Insomnia 02/12/2023   DVT (deep venous thrombosis) (HCC) 05/11/2022   Acute pulmonary embolism (HCC) 05/09/2022  Elevated troponin 05/09/2022   Status post total right knee replacement 03/31/2022   Primary osteoarthritis of right knee 03/20/2022   Eating disorder 03/20/2022   Obesity, Class I, BMI 30-34.9 12/31/2020   Vitamin B12 deficiency 10/08/2020   Mood disorder (HCC), with emotional eating 09/24/2020   Vitamin D deficiency 07/02/2020   Depression 07/02/2020   Stress due to illness of family member-  daughter with drug addiction 05/15/2020   Depression, recurrent (HCC) 05/15/2020   Anemia 05/15/2020   Prediabetes 05/15/2020   Breast asymmetry following reconstructive surgery 04/24/2020   Acquired absence of breast 04/24/2020   S/P breast reconstruction, bilateral 04/24/2020   Breast cancer, left (HCC) 01/09/2016   Monoallelic mutation of ATM gene 16/06/9603   Malignant neoplasm of central portion of left breast in  female, estrogen receptor positive (HCC) 10/31/2015   Endometriosis 03/11/2011   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,  multifocal IDC, DCIS, ER/PR+,  01-09-2016 s/p bilteral mastectomies w/ left sln bx;  no chemoradiation   Cataracts, bilateral    Depression    Diabetes mellitus without complication (HCC)    type 2   GAD (generalized anxiety disorder)    Gallbladder problem    History of ovarian cyst    IBS (irritable bowel syndrome)    Joint pain    PONV (postoperative nausea and vomiting)    does well with scop patch   Retinal detachment    Rheg OS   Right knee meniscal tear    Urgency of urination     Family History  Problem Relation Age of Onset   Heart failure Father    Prostate cancer Father 14   Retinal detachment Father    Colon polyps Mother        approx 2   Other Mother        hx HPV and hysterectomy due to precancerous cells   Depression Mother    Anxiety disorder Mother    Other Sister 15       hx of hysterectomy for unspecified reason; still has ovaries   Other Sister 9       paternal half-sister hx of hysterectomy for unspecified reason; still has ovaries   Bladder Cancer Maternal Uncle 13       not a smoker   Kidney failure Maternal Grandmother    Congestive Heart Failure Maternal Grandmother    Colon cancer Maternal Grandmother 39   Diabetes Maternal Grandmother    Lung cancer Maternal Grandfather 87       smoker   Breast cancer Paternal Grandmother        dx. early 13s; w/ hx of trauma to breast   Crohn's disease Daughter    Lung cancer Maternal Uncle 37       smoker    Past Surgical History:  Procedure Laterality Date   ABDOMINAL HYSTERECTOMY  05/1996   endometriosis   ACHILLES TENDON REPAIR Right 2010;  revision 2011   BLADDER SUSPENSION  2000   BREAST BIOPSY Left 10/2015   BREAST IMPLANT REMOVAL Bilateral  08/12/2017   Procedure: REMOVAL BILATERAL BREAST IMPLANTS;  Surgeon: Peggye Form, DO;  Location: [facility];  Service: Plastics;  Laterality: Bilateral;   BREAST RECONSTRUCTION WITH PLACEMENT OF TISSUE EXPANDER AND FLEX HD (ACELLULAR HYDRATED DERMIS) Bilateral 01/09/2016   BREAST  RECONSTRUCTION WITH PLACEMENT OF TISSUE EXPANDER AND FLEX HD (ACELLULAR HYDRATED DERMIS) Bilateral 01/09/2016   Procedure: BREAST RECONSTRUCTION WITH PLACEMENT OF TISSUE EXPANDER AND FLEX HD (ACELLULAR HYDRATED DERMIS);  Surgeon: Alena Bills Dillingham, DO;  Location: [facility];  Service: Plastics;  Laterality: Bilateral;   BREAST RECONSTRUCTION WITH PLACEMENT OF TISSUE EXPANDER AND FLEX HD (ACELLULAR HYDRATED DERMIS) Bilateral 05/29/2016   Procedure: PLACEMENT OF BILATERAL TISSUE EXPANDER AND FLEX HD (ACELLULAR HYDRATED DERMIS);  Surgeon: Peggye Form, DO;  Location: [facility];  Service: Plastics;  Laterality: Bilateral;   BREAST REDUCTION SURGERY Bilateral 11/26/2016   Procedure: BILATERAL BREAST CAPSULE CONTRACTURE RELASE;  Surgeon: Peggye Form, DO;  Location: [facility];  Service: Plastics;  Laterality: Bilateral;   CESAREAN SECTION  1987; 1989; 1991   ELBOW SURGERY Right x3   last one 02-01-2019 @ [facility]   EYE SURGERY Left 10/01/2020   Pneumatic retinopexy for repair of rheg RD - Dr. Rennis Chris   EYE SURGERY Left 10/04/2020   PPV - Dr. Rennis Chris   FAT GRAFTING BILATERAL BREAST  08-09-2018  @[facility]    GAS INSERTION Left 10/04/2020   Procedure: INSERTION OF GAS;  Surgeon: Rennis Chris, MD;  Location: [facility] OR;  Service: Ophthalmology;  Laterality: Left;   GAS/FLUID EXCHANGE Left 10/04/2020   Procedure: GAS/FLUID EXCHANGE;  Surgeon: Rennis Chris, MD;  Location: [facility] OR;  Service: Ophthalmology;  Laterality: Left;   INCISION AND DRAINAGE OF WOUND Bilateral 02/11/2016   Procedure: IRRIGATION AND DEBRIDEMENT OF BILATERAL BREAST POCKET;  Surgeon: Peggye Form, DO;   Location: [facility];  Service: Plastics;  Laterality: Bilateral;   KNEE ARTHROSCOPY WITH MEDIAL MENISECTOMY Right 12/20/2019   Procedure: KNEE ARTHROSCOPY WITH MEDIAL MENISECTOMY;  Surgeon: Teryl Lucy, MD;  Location: [facility];  Service: Orthopedics;  Laterality: Right;   KNEE ARTHROSCOPY WITH MEDIAL MENISECTOMY Right 10/30/2021   Procedure: RIGHT KNEE ARTHROSCOPY WITH PARTIAL MEDIAL MENISECTOMY SYNOVECTOMY;  Surgeon: Tarry Kos, MD;  Location: [facility];  Service: Orthopedics;  Laterality: Right;   LAPAROSCOPIC APPENDECTOMY  04-07-2011   @[facility]    w/ Excision peritoneal lipoma and lysis adhesions   LAPAROSCOPIC CHOLECYSTECTOMY  ~ 1999   LASER PHOTO ABLATION Right 10/04/2020   Procedure: LASER RETINOPEXY WITH INDIRECT LASER OPTHALMOSCOPE, RIGHT EYE;  Surgeon: Rennis Chris, MD;  Location: [facility] OR;  Service: Ophthalmology;  Laterality: Right;   LIPOSUCTION WITH LIPOFILLING Bilateral 11/26/2016   Procedure: LIPOFILLING FOR SYMMETRY;  Surgeon: Peggye Form, DO;  Location: [facility];  Service: Plastics;  Laterality: Bilateral;   LIPOSUCTION WITH LIPOFILLING Bilateral 01/21/2017   Procedure: BILATERAL BREAST  LIPOFILLING FOR ASYMMETRY;  Surgeon: Peggye Form, DO;  Location: [facility];  Service: Plastics;  Laterality: Bilateral;   LIPOSUCTION WITH LIPOFILLING Bilateral 06/28/2020   Procedure: Lipofilling bilateral breasts for asymmetry;  Surgeon: Peggye Form, DO;  Location: [facility];  Service: Plastics;  Laterality: Bilateral;  90 min   MASTECTOMY Bilateral 01/09/2016   NIPPLE SPARING MASTECTOMY/SENTINAL LYMPH NODE BIOPSY/RECONSTRUCTION/PLACEMENT OF TISSUE EXPANDER Bilateral 01/09/2016   Procedure: BILATERAL NIPPLE SPARING MASTECTOMY WITH LEFT SENTINAL LYMPH NODE BIOPSY ;  Surgeon: Almond Lint, MD;  Location: [facility];  Service: General;  Laterality: Bilateral;   PHOTOCOAGULATION WITH LASER  Left 10/04/2020   Procedure: PHOTOCOAGULATION WITH LASER;  Surgeon: Rennis Chris, MD;  Location: [facility] OR;  Service: Ophthalmology;  Laterality: Left;   REMOVAL OF BILATERAL TISSUE EXPANDERS WITH PLACEMENT OF BILATERAL  BREAST IMPLANTS Bilateral 08/20/2016   Procedure: REMOVAL OF BILATERAL TISSUE EXPANDERS WITH PLACEMENT OF BILATERAL SILICONE IMPLANTS;  Surgeon: Peggye Form, DO;  Location: [facility];  Service: Plastics;  Laterality: Bilateral;   REMOVAL OF BILATERAL TISSUE EXPANDERS WITH PLACEMENT OF BILATERAL BREAST IMPLANTS Bilateral 11/05/2017   Procedure: REMOVAL OF BILATERAL TISSUE EXPANDERS WITH PLACEMENT OF BILATERAL BREAST SILICONE IMPLANTS;  Surgeon: Peggye Form, DO;  Location: [facility];  Service: Plastics;  Laterality: Bilateral;   REMOVAL OF TISSUE EXPANDER Bilateral 02/11/2016   Procedure: REMOVAL OF BILATERAL TISSUE EXPANDERS AND FLEX HD REMOVAL;  Surgeon: Peggye Form, DO;  Location: [facility];  Service: Plastics;  Laterality: Bilateral;   RETINAL DETACHMENT SURGERY Left 10/01/2020   Pneumatic retinopexy for repair of rheg RD - Dr. Rennis Chris   RETINAL DETACHMENT SURGERY Left 10/04/2020   PPV - Dr. Rennis Chris   SCLERAL BUCKLE Left 10/04/2020   Procedure: SCLERAL BUCKLE LEFT EYE;  Surgeon: Rennis Chris, MD;  Location: [facility] OR;  Service: Ophthalmology;  Laterality: Left;   TISSUE EXPANDER PLACEMENT Bilateral 08/12/2017   Procedure: PLACEMENT OF BILATERAL TISSUE EXPANDER;  Surgeon: Peggye Form, DO;  Location: [facility];  Service: Plastics;  Laterality: Bilateral;   TOTAL KNEE ARTHROPLASTY Right 03/31/2022   Procedure: RIGHT TOTAL KNEE REPLACEMENT;  Surgeon: Tarry Kos, MD;  Location: [facility];  Service: Orthopedics;  Laterality: Right;   TUBAL LIGATION Bilateral 1991   VITRECTOMY 25 GAUGE WITH SCLERAL BUCKLE Left 10/04/2020   Procedure: 25 GAUGE PARS PLANA VITRECTOMY LEFT EYE ;  Surgeon: Rennis Chris, MD;  Location: [facility] OR;  Service: Ophthalmology;  Laterality: Left;   Social History   Occupational History   Occupation: Cabin crew: [employer]  Tobacco Use   Smoking status: Former    Packs/day: 1.00    Years: 10.00    Additional pack years: 0.00    Total pack years: 10.00    Types: Cigarettes    Quit date: 06/08/2008    Years since quitting: 14.7    Passive exposure: Never   Smokeless tobacco: Never  Vaping Use   Vaping Use: Never used  Substance and Sexual Activity   Alcohol use: No   Drug use: Never   Sexual activity: Never    Birth control/protection: Surgical    Comment: hysterectomy

## 2023-03-03 ENCOUNTER — Ambulatory Visit: Payer: 59 | Admitting: Physician Assistant

## 2023-03-17 ENCOUNTER — Telehealth (INDEPENDENT_AMBULATORY_CARE_PROVIDER_SITE_OTHER): Payer: 59 | Admitting: Family Medicine

## 2023-03-17 ENCOUNTER — Encounter: Payer: Self-pay | Admitting: Family Medicine

## 2023-03-17 VITALS — Wt 220.0 lb

## 2023-03-17 DIAGNOSIS — E119 Type 2 diabetes mellitus without complications: Secondary | ICD-10-CM | POA: Diagnosis not present

## 2023-03-17 DIAGNOSIS — E559 Vitamin D deficiency, unspecified: Secondary | ICD-10-CM

## 2023-03-17 DIAGNOSIS — G4700 Insomnia, unspecified: Secondary | ICD-10-CM | POA: Diagnosis not present

## 2023-03-17 DIAGNOSIS — R350 Frequency of micturition: Secondary | ICD-10-CM | POA: Diagnosis not present

## 2023-03-17 MED ORDER — OXYBUTYNIN CHLORIDE ER 5 MG PO TB24
5.0000 mg | ORAL_TABLET | Freq: Every day | ORAL | 1 refills | Status: DC
Start: 2023-03-17 — End: 2023-07-30

## 2023-03-17 MED ORDER — VITAMIN D (ERGOCALCIFEROL) 1.25 MG (50000 UNIT) PO CAPS: 50000.0000 [IU] | ORAL_CAPSULE | ORAL | 0 refills | Status: DC

## 2023-03-17 NOTE — Assessment & Plan Note (Signed)
We discussed the pathophysiology of diabetes mellitus in addition to the preventative care that she will need to have with this new diagnosis.  Time was given for her to ask questions for clarification.  Patient verbalized understanding of the following: -A1c goal <7.0 -Annual urine microalbumin, foot exam, diabetic eye exam, labs -Office visit every 4 months, if well-controlled may consider every 6 months

## 2023-03-17 NOTE — Assessment & Plan Note (Addendum)
We discussed discontinuing all fluid intake at least 2 hours before bed.  Additionally, patient is agreeable to pelvic floor PT for urinary symptoms.  She will go for a few sessions to learn the exercises under proper supervision, and after that may switch to at home exercises since her financial situation is strained at the moment.  We also discussed a trial of oxybutynin before bed to see if there is any improvement.  Will follow-up at her annual physical in a couple of months.  Starting lowest dose oxybutynin 5 mg nightly at bedtime, can increase to 10 mg or 15 mg if necessary.

## 2023-03-17 NOTE — Assessment & Plan Note (Signed)
Discontinue trazodone as it was ineffective for sleep.  We will try to gain better control of nocturia before trying any more sleep medications.

## 2023-03-17 NOTE — Progress Notes (Signed)
Virtual Visit via Video Note  I connected with Christina [name] on 03/17/23 at 10:50 AM EDT by a video enabled telemedicine application and verified that I am speaking with the correct person using two identifiers.  Patient Location: Home Provider Location: Office/Clinic  I discussed the limitations, risks, security, and privacy concerns of performing an evaluation and management service by video and the availability of in person appointments. I also discussed with the patient that there may be a patient responsible charge related to this service. The patient expressed understanding and agreed to proceed.    Subjective   Patient ID: Christina [name], female    DOB: 1966-08-30  Age: 57 y.o. MRN: [id number]  Chief Complaint  Patient presents with   Diabetes    HPI Christina [name] is a 57 y.o. female presenting today for follow up of new diagnosis of diabetes mellitus.  She also continues to have concerns of polyuria, which is particularly bothersome during the night.  She wakes up 4-6 times each night to use the bathroom and finds that she has a full bladder almost every time that she does get up.  This is impacting her ability to get a good night's sleep.  She is also found that the trazodone started at the last appointment was ineffective.  ROS Negative unless otherwise noted in HPI  Outpatient Medications Prior to Visit  Medication Sig   [DISCONTINUED] traZODone (DESYREL) 50 MG tablet Take 1/2-1 tablet (25-50 mg total) by mouth at bedtime as needed for sleep. (Patient not taking: Reported on 03/17/2023)   No facility-administered medications prior to visit.     Objective:     Physical Exam General: Speaking clearly in complete sentences without any shortness of breath.  Alert and oriented x3.  Normal judgment. No apparent acute distress.   Assessment & Plan:  Type 2 diabetes mellitus without complication, without long-term current use of insulin (HCC) Assessment & Plan: We  discussed the pathophysiology of diabetes mellitus in addition to the preventative care that she will need to have with this new diagnosis.  Time was given for her to ask questions for clarification.  Patient verbalized understanding of the following: -A1c goal <7.0 -Annual urine microalbumin, foot exam, diabetic eye exam, labs -Office visit every 4 months, if well-controlled may consider every 6 months   Insomnia, unspecified type Assessment & Plan: Discontinue trazodone as it was ineffective for sleep.  We will try to gain better control of nocturia before trying any more sleep medications.   Urinary frequency Assessment & Plan: We discussed discontinuing all fluid intake at least 2 hours before bed.  Additionally, patient is agreeable to pelvic floor PT for urinary symptoms.  She will go for a few sessions to learn the exercises under proper supervision, and after that may switch to at home exercises since her financial situation is strained at the moment.  We also discussed a trial of oxybutynin before bed to see if there is any improvement.  Will follow-up at her annual physical in a couple of months.  Starting lowest dose oxybutynin 5 mg nightly at bedtime, can increase to 10 mg or 15 mg if necessary.  Orders: -     oxyBUTYnin Chloride ER; Take 1 tablet (5 mg total) by mouth at bedtime. For urinary frequency.  Dispense: 90 tablet; Refill: 1 -     Ambulatory referral to Physical Therapy  Vitamin D deficiency -     Vitamin D (Ergocalciferol); Take 1 capsule (50,000 Units  total) by mouth every 7 (seven) days.  Dispense: 12 capsule; Refill: 0    Return in about 2 months (around 05/20/2023) for annual physical, fasting blood work 1 week before.   I discussed the assessment and treatment plan with the patient. The patient was provided an opportunity to ask questions, and all were answered. The patient agreed with the plan and demonstrated an understanding of the instructions.   The patient  was advised to call back or seek an in-person evaluation if the symptoms worsen or if the condition fails to improve as anticipated.  The above assessment and management plan was discussed with the patient. The patient verbalized understanding of and has agreed to the management plan.   [name], PA

## 2023-03-17 NOTE — Patient Instructions (Addendum)
DIABETES DIAGNOSIS: -At each visit, we will check your A1c.  The A1c is an average measure of your blood sugars over 3 months, which gives [name] a good idea of if your blood sugars are too high or well-balanced.  The goal is for your A1c to stay less than 7.0 -At least for the first year, I will see you about every 4 months to check your A1c and make sure that it is well-controlled with your current routine.  If it is well-controlled for a long period of time, we may have the option to stretch out those visits further. -Once a year, you will have a foot exam in the office and provide a urine sample for [name] to check your kidney function. -Once a year, you will need a diabetic eye exam with your eye doctor.

## 2023-03-26 ENCOUNTER — Telehealth: Payer: Self-pay

## 2023-03-26 ENCOUNTER — Encounter: Payer: Self-pay | Admitting: Family Medicine

## 2023-03-26 ENCOUNTER — Telehealth: Payer: 59 | Admitting: Family Medicine

## 2023-03-26 DIAGNOSIS — F411 Generalized anxiety disorder: Secondary | ICD-10-CM | POA: Diagnosis not present

## 2023-03-26 DIAGNOSIS — F320 Major depressive disorder, single episode, mild: Secondary | ICD-10-CM

## 2023-03-26 MED ORDER — DESVENLAFAXINE SUCCINATE ER 25 MG PO TB24: 25.0000 mg | ORAL_TABLET | Freq: Every day | ORAL | 1 refills | Status: DC

## 2023-03-26 MED ORDER — HYDROXYZINE HCL 50 MG PO TABS
ORAL_TABLET | ORAL | 1 refills | Status: DC
Start: 2023-03-26 — End: 2024-03-02

## 2023-03-26 NOTE — Assessment & Plan Note (Addendum)
Starting trial of Pristiq 25 mg daily.  Opted for trial of SNRI since Zoloft was not effective in the past.  She will also trial hydroxyzine 25-50 mg as needed up to 3 times a day for breakthrough anxiety.  If Pristiq is ineffective, we can consider switching to a different SNRI/SSRI or a trial of buspirone.  We reviewed the mechanism of action and common side effects of Pristiq and hydroxyzine.  Patient verbalized understanding and is agreeable with this plan.

## 2023-03-26 NOTE — Progress Notes (Signed)
Virtual Visit via Video Note  I connected with Christina [name] on 03/26/23 at  3:30 PM EDT by a video enabled telemedicine application and verified that I am speaking with the correct person using two identifiers.  Patient Location: Home Provider Location: Office/Clinic  I discussed the limitations, risks, security, and privacy concerns of performing an evaluation and management service by video and the availability of in person appointments. I also discussed with the patient that there may be a patient responsible charge related to this service. The patient expressed understanding and agreed to proceed.    Subjective   Patient ID: Christina [name], female    DOB: [birth year]  Age: 57 y.o. MRN: [id number]  Chief Complaint  Patient presents with   Acute Visit   Anxiety    HPI Christina [name] is a 57 y.o. female presenting today for anxiety.  She has experienced an increase in stress and subsequently her anxiety levels over the past several months and now feels "at wits end".  Her daughter is in jail, so she is currently raising her 69 and 43-year-old grandchildren.  She knows that they are just kids being kids, but it still has but increased amount of stress on her.  She recognizes that it would be beneficial if she had some medication support to keep her at a more steady baseline.  In the past, she has tried Zoloft, Wellbutrin, and Rexulti for depression but felt that none of them were effective.  She does not want anything like Xanax.  ROS Negative unless otherwise noted in HPI  Outpatient Medications Prior to Visit  Medication Sig   oxybutynin (DITROPAN-XL) 5 MG 24 hr tablet Take 1 tablet (5 mg total) by mouth at bedtime. For urinary frequency.   Vitamin D, Ergocalciferol, (DRISDOL) 1.25 MG (50000 UNIT) CAPS capsule Take 1 capsule (50,000 Units total) by mouth every 7 (seven) days.   No facility-administered medications prior to visit.     Objective:     Physical Exam General:  Speaking clearly in complete sentences without any shortness of breath.  Alert and oriented x3.  Normal judgment. No apparent acute distress.   Assessment & Plan:  GAD (generalized anxiety disorder) Assessment & Plan: Starting trial of Pristiq 25 mg daily.  Opted for trial of SNRI since Zoloft was not effective in the past.  She will also trial hydroxyzine 25-50 mg as needed up to 3 times a day for breakthrough anxiety.  If Pristiq is ineffective, we can consider switching to a different SNRI/SSRI or a trial of buspirone.  We reviewed the mechanism of action and common side effects of Pristiq and hydroxyzine.  Patient verbalized understanding and is agreeable with this plan.  Orders: -     Desvenlafaxine Succinate ER; Take 1 tablet (25 mg total) by mouth daily.  Dispense: 90 tablet; Refill: 1 -     hydrOXYzine HCl; Take 1/2 to 1 tablet (25 to 50 mg total) every 8 hours as needed for breakthrough anxiety.  Dispense: 30 tablet; Refill: 1    Return on 05/20/2023 for annual physical and to see how Pristiq has helped.   I discussed the assessment and treatment plan with the patient. The patient was provided an opportunity to ask questions, and all were answered. The patient agreed with the plan and demonstrated an understanding of the instructions.   The patient was advised to call back or seek an in-person evaluation if the symptoms worsen or if the condition fails to improve  as anticipated.  The above assessment and management plan was discussed with the patient. The patient verbalized understanding of and has agreed to the management plan.   [name], PA

## 2023-03-26 NOTE — Telephone Encounter (Signed)
Pt is coming today at 330 in a cancellation spot.

## 2023-03-26 NOTE — Telephone Encounter (Signed)
Pt is requesting to have an appointment to discuss Anxiety and stress. Pt reports that its really an urgent need. Currently our first appointment here in office is 04/15/23 and she stressed that she couldn't wait until then.   What would you recommend?

## 2023-03-29 NOTE — Progress Notes (Unsigned)
New Patient Note  RE: Christina [name] MRN: [id number] DOB: Nov 22, 1965 Date of Office Visit: 03/30/2023  Consult requested by: [name], PA Primary care provider: [name], PA  Chief Complaint: Pruritis, Urticaria, and Establish Care  History of Present Illness: I had the pleasure of seeing Christina [name] for initial evaluation at the [facility] on 03/30/2023. She is a 57 y.o. female, who is referred here by [name], PA for the evaluation of pruritus.  Itching started about 3 months ago. This can occur anywhere on her body.  She also broke out in hives in May for 1 week - on the face, neck, arms. The hives resolved but the pruritus is persisting.   No ecchymosis upon resolution. Associated symptoms include: none.  Frequency of episodes: daily - throughout the day. Patient states that there is increased stress at home. PCP prescribed some meds but haven't tried it yet. She was on meds for this about 1 year ago.  Suspected triggers are unknown. Denies any fevers, chills, changes in medications, foods, personal care products or recent infections. She has tried the following therapies: none. Systemic steroids: no. Currently on no daily antihistamines.  Previous work up includes: none. Previous history of rash/hives/pruritus: no. Patient is up to date with the following cancer screening tests: physical exam, colonoscopy.  Does not moisturize daily.   Patient had hysterectomy.   Patient had breast cancer in 2017 s/p mastectomy - currently in remission.   Assessment and Plan: Christina [name] is a 57 y.o. female with: Pruritus Whole body daily pruritus x 3 months with 1 week of hives. The hives resolved but the pruritus is persisting. No triggers noted. There is increased stress at home. Didn't try any medications for this. Denies changes in diet, meds, personal care products. Concerned about allergic triggers as she does mention rhinitis symptoms. H/o  breast cancer.  Today's skin prick testing showed: Negative to indoor/outdoor allergens and common foods.  Etiology unclear. If you start breaking out again - take pictures and let us know. Get bloodwork to rule out other etiologies.  See below for proper skin care. Start taking allegra 180mg  or zyrtec 10mg  once a day and see if it helps with the itching. May take it twice a day if needed as long as it doesn't make you too drowsy.  Chronic rhinitis Rhinitis symptoms in the spring and fall. No prior testing. Doesn't take any meds for this. Today's skin prick testing showed: Negative to indoor/outdoor allergens. Get bloodwork instead of intradermal testing as already ordering bloodwork for pruritus. The antihistamines as above should also help with these symptoms.  Elevated blood pressure reading Follow up with PCP regarding this.   Return in about 2 months (around 05/31/2023).  No orders of the defined types were placed in this encounter.  Lab Orders         Alpha-Gal Panel         ANA w/Reflex         C3 and C4         CBC with Differential/Platelet         Chronic Urticaria         Comprehensive metabolic panel         C-reactive protein         Sedimentation rate         Tryptase         Protein Electrophoresis, Urine Rflx.  Protein electrophoresis, serum         Allergens w/Total IgE Area 2      Other allergy screening: Asthma: no Rhino conjunctivitis: yes Sneezing, rhinorrhea, watery eyes mainly in the spring and fall. No prior testing. Patient doesn't take any meds for this.   Food allergy: no Medication allergy: no Hymenoptera allergy: no Eczema:no History of recurrent infections suggestive of immunodeficency: no  Diagnostics: Skin Testing: Environmental allergy panel and select foods. Negative to indoor/outdoor allergens and common foods.  Results discussed with patient/family.  Airborne Adult Perc - 03/30/23 0919     Time Antigen Placed 0919     Allergen Manufacturer Waynette Buttery    Location Back    Number of Test 55    Panel 1 Select    1. Control-Buffer 50% Glycerol Negative    2. Control-Histamine 2+    3. Bahia Negative    4. French Southern Territories Negative    5. Johnson Negative    6. Kentucky Blue Negative    7. Meadow Fescue Negative    8. Perennial Rye Negative    9. Timothy Negative    10. Ragweed Mix Negative    11. Cocklebur Negative    12. Plantain,  English Negative    13. Baccharis Negative    14. Dog Fennel Negative    15. Russian Thistle Negative    16. Lamb's Quarters Negative    17. Sheep Sorrell Negative    18. Rough Pigweed Negative    19. Marsh Elder, Rough Negative    20. Mugwort, Common Negative    21. Box, Elder Negative    22. Cedar, red Negative    23. Sweet Gum Negative    24. Pecan Pollen Negative    25. Pine Mix Negative    26. Walnut, Black Pollen Negative    27. Red Mulberry Negative    28. Ash Mix Negative    29. Birch Mix Negative    30. Beech American Negative    31. Cottonwood, Guinea-Bissau Negative    32. Hickory, White Negative    33. Maple Mix Negative    34. Oak, Guinea-Bissau Mix Negative    35. Sycamore Eastern Negative    36. Alternaria Alternata Negative    37. Cladosporium Herbarum Negative    38. Aspergillus Mix Negative    39. Penicillium Mix Negative    40. Bipolaris Sorokiniana (Helminthosporium) Negative    41. Drechslera Spicifera (Curvularia) Negative    42. Mucor Plumbeus Negative    43. Fusarium Moniliforme Negative    44. Aureobasidium Pullulans (pullulara) Negative    45. Rhizopus Oryzae Negative    46. Botrytis Cinera Negative    47. Epicoccum Nigrum Negative    48. Phoma Betae Negative    49. Dust Mite Mix Negative    50. Cat Hair 10,000 BAU/ml Negative    51.  Dog Epithelia Negative    52. Mixed Feathers Negative    53. Horse Epithelia Negative    54. Cockroach, German Negative    55. Tobacco Leaf Negative             Food Adult Perc - 03/30/23 0900     Time Antigen  Placed 8657    Allergen Manufacturer Waynette Buttery    Location Back    Number of allergen test 9    1. Peanut Negative    2. Soybean Negative    3. Wheat Negative    4. Sesame Negative    5. Milk, Cow Negative  6. Casein Negative    7. Egg White, Chicken Negative    8. Shellfish Mix Negative    9. Fish Mix Negative             Past Medical History: Patient Active Problem List   Diagnosis Date Noted   Chronic rhinitis 03/30/2023   Elevated blood pressure reading 03/30/2023   GAD (generalized anxiety disorder) 03/26/2023   Urinary frequency 03/17/2023   Numbness and tingling in right hand 02/12/2023   Numbness of toes 02/12/2023   Pruritus 02/12/2023   Primary insomnia 02/12/2023   DVT (deep venous thrombosis) (HCC) 05/11/2022   Acute pulmonary embolism (HCC) 05/09/2022   Elevated troponin 05/09/2022   Status post total right knee replacement 03/31/2022   Primary osteoarthritis of right knee 03/20/2022   Eating disorder 03/20/2022   BMI 31.0-31.9,adult 12/31/2020   Vitamin B12 deficiency 10/08/2020   Mood disorder (HCC), with emotional eating 09/24/2020   Vitamin D deficiency 07/02/2020   Stress due to illness of family member-  daughter with drug addiction 05/15/2020   Depression, recurrent (HCC) 05/15/2020   Anemia 05/15/2020   Type 2 diabetes mellitus (HCC) 05/15/2020   Breast asymmetry following reconstructive surgery 04/24/2020   Acquired absence of breast 04/24/2020   S/P breast reconstruction, bilateral 04/24/2020   Breast cancer, left (HCC) 01/09/2016   Monoallelic mutation of ATM gene 42/59/5638   Malignant neoplasm of central portion of left breast in female, estrogen receptor positive (HCC) 10/31/2015   Endometriosis 03/11/2011   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,  multifocal IDC, DCIS, ER/PR+,  01-09-2016 s/p bilteral  mastectomies w/ left sln bx;  no chemoradiation   Cataracts, bilateral    Depression    Diabetes mellitus without complication (HCC)    type 2   GAD (generalized anxiety disorder)    Gallbladder problem    History of ovarian cyst    IBS (irritable bowel syndrome)    Joint pain    PONV (postoperative nausea and vomiting)    does well with scop patch   Retinal detachment    Rheg OS   Right knee meniscal tear    Urgency of urination    Past Surgical History: Past Surgical History:  Procedure Laterality Date   ABDOMINAL HYSTERECTOMY  05/1996   endometriosis   ACHILLES TENDON REPAIR Right 2010;  revision 2011   BLADDER SUSPENSION  2000   BREAST BIOPSY Left 10/2015   BREAST IMPLANT REMOVAL Bilateral 08/12/2017   Procedure: REMOVAL BILATERAL BREAST IMPLANTS;  Surgeon: Peggye Form, DO;  Location: [facility];  Service: Plastics;  Laterality: Bilateral;   BREAST RECONSTRUCTION WITH PLACEMENT OF TISSUE EXPANDER AND FLEX HD (ACELLULAR HYDRATED DERMIS) Bilateral 01/09/2016   BREAST RECONSTRUCTION WITH PLACEMENT OF TISSUE EXPANDER AND FLEX HD (ACELLULAR HYDRATED DERMIS) Bilateral 01/09/2016   Procedure: BREAST RECONSTRUCTION WITH PLACEMENT OF TISSUE EXPANDER AND FLEX HD (ACELLULAR HYDRATED DERMIS);  Surgeon: Alena Bills Dillingham, DO;  Location: [facility] OR;  Service: Plastics;  Laterality: Bilateral;   BREAST RECONSTRUCTION WITH PLACEMENT OF TISSUE EXPANDER AND FLEX HD (ACELLULAR HYDRATED DERMIS) Bilateral 05/29/2016   Procedure: PLACEMENT OF BILATERAL TISSUE EXPANDER AND FLEX HD (ACELLULAR HYDRATED DERMIS);  Surgeon: Peggye Form, DO;  Location:  SURGERY CENTER;  Service: Plastics;  Laterality: Bilateral;   BREAST REDUCTION SURGERY Bilateral 11/26/2016  Procedure: BILATERAL BREAST CAPSULE CONTRACTURE RELASE;  Surgeon: Peggye Form, DO;  Location: [facility];  Service: Plastics;  Laterality: Bilateral;   CESAREAN SECTION  1987; 1989; 1991   ELBOW  SURGERY Right x3   last one 02-01-2019 @ [facility]   EYE SURGERY Left 10/01/2020   Pneumatic retinopexy for repair of rheg RD - Dr. Rennis Chris   EYE SURGERY Left 10/04/2020   PPV - Dr. Rennis Chris   FAT GRAFTING BILATERAL BREAST  08-09-2018  @[facility]    GAS INSERTION Left 10/04/2020   Procedure: INSERTION OF GAS;  Surgeon: Rennis Chris, MD;  Location: [facility] OR;  Service: Ophthalmology;  Laterality: Left;   GAS/FLUID EXCHANGE Left 10/04/2020   Procedure: GAS/FLUID EXCHANGE;  Surgeon: Rennis Chris, MD;  Location: [facility] OR;  Service: Ophthalmology;  Laterality: Left;   INCISION AND DRAINAGE OF WOUND Bilateral 02/11/2016   Procedure: IRRIGATION AND DEBRIDEMENT OF BILATERAL BREAST POCKET;  Surgeon: Peggye Form, DO;  Location: [facility];  Service: Plastics;  Laterality: Bilateral;   KNEE ARTHROSCOPY WITH MEDIAL MENISECTOMY Right 12/20/2019   Procedure: KNEE ARTHROSCOPY WITH MEDIAL MENISECTOMY;  Surgeon: Teryl Lucy, MD;  Location: [facility];  Service: Orthopedics;  Laterality: Right;   KNEE ARTHROSCOPY WITH MEDIAL MENISECTOMY Right 10/30/2021   Procedure: RIGHT KNEE ARTHROSCOPY WITH PARTIAL MEDIAL MENISECTOMY SYNOVECTOMY;  Surgeon: Tarry Kos, MD;  Location: [facility];  Service: Orthopedics;  Laterality: Right;   LAPAROSCOPIC APPENDECTOMY  04-07-2011   @[facility]    w/ Excision peritoneal lipoma and lysis adhesions   LAPAROSCOPIC CHOLECYSTECTOMY  ~ 1999   LASER PHOTO ABLATION Right 10/04/2020   Procedure: LASER RETINOPEXY WITH INDIRECT LASER OPTHALMOSCOPE, RIGHT EYE;  Surgeon: Rennis Chris, MD;  Location: [facility] OR;  Service: Ophthalmology;  Laterality: Right;   LIPOSUCTION WITH LIPOFILLING Bilateral 11/26/2016   Procedure: LIPOFILLING FOR SYMMETRY;  Surgeon: Peggye Form, DO;  Location: [facility];  Service: Plastics;  Laterality: Bilateral;   LIPOSUCTION WITH LIPOFILLING Bilateral 01/21/2017   Procedure: BILATERAL BREAST  LIPOFILLING FOR  ASYMMETRY;  Surgeon: Peggye Form, DO;  Location: [facility];  Service: Plastics;  Laterality: Bilateral;   LIPOSUCTION WITH LIPOFILLING Bilateral 06/28/2020   Procedure: Lipofilling bilateral breasts for asymmetry;  Surgeon: Peggye Form, DO;  Location: [facility];  Service: Plastics;  Laterality: Bilateral;  90 min   MASTECTOMY Bilateral 01/09/2016   NIPPLE SPARING MASTECTOMY/SENTINAL LYMPH NODE BIOPSY/RECONSTRUCTION/PLACEMENT OF TISSUE EXPANDER Bilateral 01/09/2016   Procedure: BILATERAL NIPPLE SPARING MASTECTOMY WITH LEFT SENTINAL LYMPH NODE BIOPSY ;  Surgeon: Almond Lint, MD;  Location: [facility];  Service: General;  Laterality: Bilateral;   PHOTOCOAGULATION WITH LASER Left 10/04/2020   Procedure: PHOTOCOAGULATION WITH LASER;  Surgeon: Rennis Chris, MD;  Location: [facility] OR;  Service: Ophthalmology;  Laterality: Left;   REMOVAL OF BILATERAL TISSUE EXPANDERS WITH PLACEMENT OF BILATERAL BREAST IMPLANTS Bilateral 08/20/2016   Procedure: REMOVAL OF BILATERAL TISSUE EXPANDERS WITH PLACEMENT OF BILATERAL SILICONE IMPLANTS;  Surgeon: Peggye Form, DO;  Location: [facility];  Service: Plastics;  Laterality: Bilateral;   REMOVAL OF BILATERAL TISSUE EXPANDERS WITH PLACEMENT OF BILATERAL BREAST IMPLANTS Bilateral 11/05/2017   Procedure: REMOVAL OF BILATERAL TISSUE EXPANDERS WITH PLACEMENT OF BILATERAL BREAST SILICONE IMPLANTS;  Surgeon: Peggye Form, DO;  Location: [facility];  Service: Plastics;  Laterality: Bilateral;   REMOVAL OF TISSUE EXPANDER Bilateral 02/11/2016   Procedure: REMOVAL OF BILATERAL TISSUE EXPANDERS AND FLEX HD REMOVAL;  Surgeon: Peggye Form, DO;  Location: [facility];  Service: Plastics;  Laterality: Bilateral;   RETINAL DETACHMENT SURGERY Left 10/01/2020   Pneumatic retinopexy for repair of rheg RD - Dr. Rennis Chris   RETINAL DETACHMENT SURGERY Left 10/04/2020   PPV - Dr. Rennis Chris    SCLERAL BUCKLE Left 10/04/2020   Procedure: SCLERAL BUCKLE LEFT EYE;  Surgeon: Rennis Chris, MD;  Location: [facility] OR;  Service: Ophthalmology;  Laterality: Left;   TISSUE EXPANDER PLACEMENT Bilateral 08/12/2017   Procedure: PLACEMENT OF BILATERAL TISSUE EXPANDER;  Surgeon: Peggye Form, DO;  Location: [facility];  Service: Plastics;  Laterality: Bilateral;   TOTAL KNEE ARTHROPLASTY Right 03/31/2022   Procedure: RIGHT TOTAL KNEE REPLACEMENT;  Surgeon: Tarry Kos, MD;  Location: [facility];  Service: Orthopedics;  Laterality: Right;   TUBAL LIGATION Bilateral 1991   VITRECTOMY 25 GAUGE WITH SCLERAL BUCKLE Left 10/04/2020   Procedure: 25 GAUGE PARS PLANA VITRECTOMY LEFT EYE ;  Surgeon: Rennis Chris, MD;  Location: [facility] OR;  Service: Ophthalmology;  Laterality: Left;   Medication List:  Current Outpatient Medications  Medication Sig Dispense Refill   hydrOXYzine (ATARAX) 50 MG tablet Take 1/2 to 1 tablet (25 to 50 mg total) every 8 hours as needed for breakthrough anxiety. 30 tablet 1   oxybutynin (DITROPAN-XL) 5 MG 24 hr tablet Take 1 tablet (5 mg total) by mouth at bedtime. For urinary frequency. 90 tablet 1   Vitamin D, Ergocalciferol, (DRISDOL) 1.25 MG (50000 UNIT) CAPS capsule Take 1 capsule (50,000 Units total) by mouth every 7 (seven) days. 12 capsule 0   No current facility-administered medications for this visit.   Allergies: No Known Allergies Social History: Social History   Socioeconomic History   Marital status: Married    Spouse name: Not on file   Number of children: Not on file   Years of education: Not on file   Highest education level: Not on file  Occupational History   Occupation: Cutter    Employer: [employer]  Tobacco Use   Smoking status: Former    Current packs/day: 0.00    Average packs/day: 1 pack/day for 10.0 years (10.0 ttl pk-yrs)    Types: Cigarettes    Start date: 06/08/1998    Quit date: 06/08/2008    Years since  quitting: 14.8    Passive exposure: Never   Smokeless tobacco: Never  Vaping Use   Vaping status: Never Used  Substance and Sexual Activity   Alcohol use: No   Drug use: Never   Sexual activity: Never    Birth control/protection: Surgical    Comment: hysterectomy  Other Topics Concern   Not on file  Social History Narrative   Not on file   Social Determinants of Health   Financial Resource Strain: Not on file  Food Insecurity: Not on file  Transportation Needs: Not on file  Physical Activity: Not on file  Stress: Not on file  Social Connections: Unknown (01/17/2022)   Received from [employer]   Social Network    Social Network: Not on file   Lives in a 30+ year old house. Smoking: quit in 2009 - smoked 1ppd x 10 yrs.  Occupation: not working  Landscape architect HistorySurveyor, minerals in the house: no Engineer, civil (consulting) in the family room: no Carpet in the bedroom: no Heating: electric Cooling: central Pet: yes 4 dogs x 10 yrs  Family History: Family History  Problem Relation Age of Onset  Colon polyps Mother        approx 2   Other Mother        hx HPV and hysterectomy due to precancerous cells   Depression Mother    Anxiety disorder Mother    Heart failure Father    Prostate cancer Father 14   Retinal detachment Father    Allergic rhinitis Sister    Other Sister 81       hx of hysterectomy for unspecified reason; still has ovaries   Other Sister 18       paternal half-sister hx of hysterectomy for unspecified reason; still has ovaries   Bladder Cancer Maternal Uncle 76       not a smoker   Lung cancer Maternal Uncle 37       smoker   Kidney failure Maternal Grandmother    Congestive Heart Failure Maternal Grandmother    Colon cancer Maternal Grandmother 56   Diabetes Maternal Grandmother    Lung cancer Maternal Grandfather 43       smoker   Breast cancer Paternal Grandmother        dx. early 51s; w/ hx of trauma to breast   Crohn's disease Daughter     Review of Systems  Constitutional:  Negative for appetite change, chills, fever and unexpected weight change.  HENT:  Negative for congestion and rhinorrhea.   Eyes:  Negative for itching.  Respiratory:  Negative for cough, chest tightness, shortness of breath and wheezing.   Cardiovascular:  Negative for chest pain.  Gastrointestinal:  Negative for abdominal pain.  Genitourinary:  Negative for difficulty urinating.  Skin:  Negative for rash.       pruritus  Neurological:  Negative for headaches.    Objective: BP (!) 140/110 (BP Location: Right Arm, Patient Position: Sitting, Cuff Size: Normal)   Pulse 62   Temp 98.3 F (36.8 C) (Temporal)   Wt 219 lb 14.4 oz (99.7 kg)   LMP  (LMP Unknown)   SpO2 97%   BMI 34.44 kg/m  Body mass index is 34.44 kg/m. Physical Exam Vitals and nursing note reviewed.  Constitutional:      Appearance: Normal appearance. She is well-developed.  HENT:     Head: Normocephalic and atraumatic.     Right Ear: Tympanic membrane and external ear normal.     Left Ear: Tympanic membrane and external ear normal.     Nose: Nose normal.     Mouth/Throat:     Mouth: Mucous membranes are moist.     Pharynx: Oropharynx is clear.  Eyes:     Conjunctiva/sclera: Conjunctivae normal.  Cardiovascular:     Rate and Rhythm: Normal rate and regular rhythm.     Heart sounds: Normal heart sounds. No murmur heard.    No friction rub. No gallop.  Pulmonary:     Effort: Pulmonary effort is normal.     Breath sounds: Normal breath sounds. No wheezing, rhonchi or rales.  Musculoskeletal:     Cervical back: Neck supple.  Skin:    General: Skin is warm.     Findings: No rash.  Neurological:     Mental Status: She is alert and oriented to person, place, and time.  Psychiatric:        Behavior: Behavior normal.   The plan was reviewed with the patient/family, and all questions/concerned were addressed.  It was my pleasure to see Christina [name] today and participate in  her care. Please feel free to contact me with any  questions or concerns.  Sincerely,  Wyline Mood, DO Allergy & Immunology  [facility] office: [phone] [facility] office: [phone]

## 2023-03-30 ENCOUNTER — Encounter: Payer: Self-pay | Admitting: Allergy

## 2023-03-30 ENCOUNTER — Ambulatory Visit (INDEPENDENT_AMBULATORY_CARE_PROVIDER_SITE_OTHER): Payer: 59 | Admitting: Allergy

## 2023-03-30 ENCOUNTER — Other Ambulatory Visit: Payer: Self-pay

## 2023-03-30 VITALS — BP 140/110 | HR 62 | Temp 98.3°F | Wt 219.9 lb

## 2023-03-30 DIAGNOSIS — R21 Rash and other nonspecific skin eruption: Secondary | ICD-10-CM | POA: Diagnosis not present

## 2023-03-30 DIAGNOSIS — L509 Urticaria, unspecified: Secondary | ICD-10-CM

## 2023-03-30 DIAGNOSIS — J310 Chronic rhinitis: Secondary | ICD-10-CM | POA: Diagnosis not present

## 2023-03-30 DIAGNOSIS — L299 Pruritus, unspecified: Secondary | ICD-10-CM

## 2023-03-30 DIAGNOSIS — J3089 Other allergic rhinitis: Secondary | ICD-10-CM

## 2023-03-30 DIAGNOSIS — R030 Elevated blood-pressure reading, without diagnosis of hypertension: Secondary | ICD-10-CM | POA: Insufficient documentation

## 2023-03-30 NOTE — Patient Instructions (Addendum)
Today's skin testing showed: Negative to indoor/outdoor allergens and common foods.   Results given.  Skin Etiology unclear. If you start breaking out again - take pictures and let us know.  Get bloodwork We are ordering labs, so please allow 1-2 weeks for the results to come back. With the newly implemented Cures Act, the labs might be visible to you at the same time that they become visible to me. However, I will not address the results until all of the results are back, so please be patient.  In the meantime, continue recommendations in your patient instructions, including avoidance measures (if applicable), until you hear from me.  See below for proper skin care. Start taking allegra 180mg  or zyrtec 10mg  once a day and see if it helps with the itching. May take it twice a day if needed as long as it doesn't make you too drowsy.  Elevated blood pressure Follow up with PCP.  Follow up in 2 months or sooner if needed.    Skin care recommendations  Bath time: Always use lukewarm water. AVOID very hot or cold water. Keep bathing time to 5-10 minutes. Do NOT use bubble bath. Use a mild soap and use just enough to wash the dirty areas. Do NOT scrub skin vigorously.  After bathing, pat dry your skin with a towel. Do NOT rub or scrub the skin.  Moisturizers and prescriptions:  ALWAYS apply moisturizers immediately after bathing (within 3 minutes). This helps to lock-in moisture. Use the moisturizer several times a day over the whole body. Good summer moisturizers include: Aveeno, CeraVe, Cetaphil. Good winter moisturizers include: Aquaphor, Vaseline, Cerave, Cetaphil, Eucerin, Vanicream. When using moisturizers along with medications, the moisturizer should be applied about one hour after applying the medication to prevent diluting effect of the medication or moisturize around where you applied the medications. When not using medications, the moisturizer can be continued twice daily  as maintenance.  Laundry and clothing: Avoid laundry products with added color or perfumes. Use unscented hypo-allergenic laundry products such as Tide free, Cheer free & gentle, and All free and clear.  If the skin still seems dry or sensitive, you can try double-rinsing the clothes. Avoid tight or scratchy clothing such as wool. Do not use fabric softeners or dyer sheets.

## 2023-03-30 NOTE — Assessment & Plan Note (Signed)
Whole body daily pruritus x 3 months with 1 week of hives. The hives resolved but the pruritus is persisting. No triggers noted. There is increased stress at home. Didn't try any medications for this. Denies changes in diet, meds, personal care products. Concerned about allergic triggers as she does mention rhinitis symptoms. H/o breast cancer.  Today's skin prick testing showed: Negative to indoor/outdoor allergens and common foods.  Etiology unclear. If you start breaking out again - take pictures and let us know. Get bloodwork to rule out other etiologies.  See below for proper skin care. Start taking allegra 180mg  or zyrtec 10mg  once a day and see if it helps with the itching. May take it twice a day if needed as long as it doesn't make you too drowsy.

## 2023-03-30 NOTE — Assessment & Plan Note (Signed)
Rhinitis symptoms in the spring and fall. No prior testing. Doesn't take any meds for this. Today's skin prick testing showed: Negative to indoor/outdoor allergens. Get bloodwork instead of intradermal testing as already ordering bloodwork for pruritus. The antihistamines as above should also help with these symptoms.

## 2023-03-31 LAB — CBC WITH DIFFERENTIAL/PLATELET
Basophils Absolute: 0 10*3/uL (ref 0.0–0.2)
EOS (ABSOLUTE): 0.2 10*3/uL (ref 0.0–0.4)
Hemoglobin: 13.7 g/dL (ref 11.1–15.9)
Immature Granulocytes: 0 %
Lymphs: 30 %
MCV: 85 fL (ref 79–97)
Platelets: 325 10*3/uL (ref 150–450)
RBC: 5.13 x10E6/uL (ref 3.77–5.28)

## 2023-03-31 LAB — PROTEIN ELECTROPHORESIS, SERUM

## 2023-03-31 LAB — TRYPTASE

## 2023-03-31 LAB — ALLERGENS W/TOTAL IGE AREA 2

## 2023-03-31 LAB — C3 AND C4
Complement C3, Serum: 154 mg/dL (ref 82–167)
Complement C4, Serum: 20 mg/dL (ref 12–38)

## 2023-03-31 LAB — COMPREHENSIVE METABOLIC PANEL
Albumin: 3.9 g/dL (ref 3.8–4.9)
Alkaline Phosphatase: 82 IU/L (ref 44–121)
Bilirubin Total: <0.2 mg/dL (ref 0.0–1.2)
CO2: 21 mmol/L (ref 20–29)
Globulin, Total: 2.5 g/dL (ref 1.5–4.5)
Total Protein: 6.4 g/dL (ref 6.0–8.5)

## 2023-03-31 LAB — ALPHA-GAL PANEL

## 2023-03-31 LAB — PROTEIN ELECTROPHORESIS, URINE REFLEX

## 2023-04-01 LAB — CBC WITH DIFFERENTIAL/PLATELET
Hematocrit: 43.4 % (ref 34.0–46.6)
Immature Grans (Abs): 0 10*3/uL (ref 0.0–0.1)
MCH: 26.7 pg (ref 26.6–33.0)
Monocytes Absolute: 0.7 10*3/uL (ref 0.1–0.9)
Monocytes: 9 %
Neutrophils: 57 %
RDW: 14.1 % (ref 11.7–15.4)
WBC: 7.5 10*3/uL (ref 3.4–10.8)

## 2023-04-01 LAB — ALLERGENS W/TOTAL IGE AREA 2

## 2023-04-01 LAB — PROTEIN ELECTROPHORESIS, URINE REFLEX

## 2023-04-01 LAB — COMPREHENSIVE METABOLIC PANEL
ALT: 85 IU/L — ABNORMAL HIGH (ref 0–32)
AST: 56 IU/L — ABNORMAL HIGH (ref 0–40)
Calcium: 10 mg/dL (ref 8.7–10.2)
Creatinine, Ser: 0.65 mg/dL (ref 0.57–1.00)

## 2023-04-01 LAB — C-REACTIVE PROTEIN: CRP: <1 mg/L (ref 0–10)

## 2023-04-01 LAB — SEDIMENTATION RATE: Sed Rate: 3 mm/hr (ref 0–40)

## 2023-04-01 LAB — PROTEIN ELECTROPHORESIS, SERUM

## 2023-04-01 LAB — CHRONIC URTICARIA

## 2023-04-02 ENCOUNTER — Other Ambulatory Visit (INDEPENDENT_AMBULATORY_CARE_PROVIDER_SITE_OTHER): Payer: 59

## 2023-04-02 ENCOUNTER — Ambulatory Visit: Payer: 59 | Admitting: Physician Assistant

## 2023-04-02 ENCOUNTER — Ambulatory Visit (INDEPENDENT_AMBULATORY_CARE_PROVIDER_SITE_OTHER): Payer: 59 | Admitting: Physician Assistant

## 2023-04-02 DIAGNOSIS — M79671 Pain in right foot: Secondary | ICD-10-CM

## 2023-04-02 LAB — PROTEIN ELECTROPHORESIS, SERUM

## 2023-04-02 LAB — PROTEIN ELECTROPHORESIS, URINE REFLEX
Beta Globulin, U: 0 %
Gamma Globulin, U: 0 %

## 2023-04-02 LAB — ALLERGENS W/TOTAL IGE AREA 2
Aspergillus Fumigatus IgE: <0.1 kU/L
Cat Dander IgE: <0.1 kU/L
Cladosporium Herbarum IgE: <0.1 kU/L
Cockroach, German IgE: <0.1 kU/L
Common Silver Birch IgE: <0.1 kU/L
Cottonwood IgE: <0.1 kU/L
Dog Dander IgE: <0.1 kU/L
Elm, American IgE: <0.1 kU/L
Pecan, Hickory IgE: <0.1 kU/L
White Mulberry IgE: <0.1 kU/L

## 2023-04-02 LAB — ANA W/REFLEX: Anti Nuclear Antibody (ANA): NEGATIVE

## 2023-04-02 LAB — COMPREHENSIVE METABOLIC PANEL
BUN/Creatinine Ratio: 20 (ref 9–23)
BUN: 13 mg/dL (ref 6–24)
Chloride: 110 mmol/L — ABNORMAL HIGH (ref 96–106)
Glucose: 95 mg/dL (ref 70–99)
Potassium: 4.3 mmol/L (ref 3.5–5.2)
Sodium: 143 mmol/L (ref 134–144)
eGFR: 104 mL/min/{1.73_m2} (ref >59)

## 2023-04-02 LAB — CBC WITH DIFFERENTIAL/PLATELET
Basos: 1 %
Eos: 3 %
Lymphocytes Absolute: 2.2 10*3/uL (ref 0.7–3.1)
MCHC: 31.6 g/dL (ref 31.5–35.7)
Neutrophils Absolute: 4.3 10*3/uL (ref 1.4–7.0)

## 2023-04-02 LAB — ALPHA-GAL PANEL
Allergen Lamb IgE: <0.1 kU/L
IgE (Immunoglobulin E), Serum: 11 IU/mL (ref 6–495)
O215-IgE Alpha-Gal: 0.1 kU/L — AB

## 2023-04-02 NOTE — Progress Notes (Signed)
Office Visit Note   Patient: Christina [name]           Date of Birth: 1966/01/18           MRN: [id number] Visit Date: 04/02/2023              Requested by: [name], PA [address] [place],  [place] [address] PCP: [name], PA   Assessment & Plan: Visit Diagnoses:  1. Pain in right foot     Plan: Impression is chronic right foot pain.  X-rays demonstrate remote avulsion to the cuboid and proximal fifth metatarsal.  I would like to put her in a postop shoe when weightbearing for the next 3 weeks or so.  She will ice and elevate for pain and swelling.  She has tramadol at home if needed.  She will follow-up in 3 weeks for repeat evaluation.  Call with concerns or questions in the meantime.  Follow-Up Instructions: Return in about 3 weeks (around 04/23/2023).   Orders:  Orders Placed This Encounter  Procedures   XR Foot Complete Right   No orders of the defined types were placed in this encounter.     Procedures: No procedures performed   Clinical Data: No additional findings.   Subjective: Chief Complaint  Patient presents with   Right Foot - Pain    HPI patient is a pleasant 57 year old female who comes in today with right foot pain for the past 3 months.  She denies ever having an injury or change in activity.  Symptoms have worsened over the past week.  The pain is located to the posterior lateral foot.  She has a burning sensation at rest but increased pain with ambulation.  She has recently noticed some swelling.  She has been taking ibuprofen without relief.  Review of Systems as detailed in HPI.  All others reviewed and are negative.   Objective: Vital Signs: LMP  (LMP Unknown)   Physical Exam well-developed well-nourished female no acute distress.  Alert and oriented x 3.  Ortho Exam right foot exam reveals mild lateral swelling.  She has tenderness along the proximal fifth metatarsal and cuboid.  She is neurovascularly intact  distally.  Specialty Comments:  No specialty comments available.  Imaging: XR Foot Complete Right  Result Date: 04/02/2023 X-rays demonstrate an old avulsion at the cuboid and fifth metatarsal    PMFS History: Patient Active Problem List   Diagnosis Date Noted   Chronic rhinitis 03/30/2023   Elevated blood pressure reading 03/30/2023   GAD (generalized anxiety disorder) 03/26/2023   Urinary frequency 03/17/2023   Numbness and tingling in right hand 02/12/2023   Numbness of toes 02/12/2023   Pruritus 02/12/2023   Primary insomnia 02/12/2023   DVT (deep venous thrombosis) (HCC) 05/11/2022   Acute pulmonary embolism (HCC) 05/09/2022   Elevated troponin 05/09/2022   Status post total right knee replacement 03/31/2022   Primary osteoarthritis of right knee 03/20/2022   Eating disorder 03/20/2022   BMI 31.0-31.9,adult 12/31/2020   Vitamin B12 deficiency 10/08/2020   Mood disorder (HCC), with emotional eating 09/24/2020   Vitamin D deficiency 07/02/2020   Stress due to illness of family member-  daughter with drug addiction 05/15/2020   Depression, recurrent (HCC) 05/15/2020   Anemia 05/15/2020   Type 2 diabetes mellitus (HCC) 05/15/2020   Breast asymmetry following reconstructive surgery 04/24/2020   Acquired absence of breast 04/24/2020   S/P breast reconstruction, bilateral 04/24/2020   Breast  cancer, left (HCC) 01/09/2016   Monoallelic mutation of ATM gene 07/05/2535   Malignant neoplasm of central portion of left breast in female, estrogen receptor positive (HCC) 10/31/2015   Endometriosis 03/11/2011   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,  multifocal IDC, DCIS, ER/PR+,  01-09-2016 s/p bilteral mastectomies w/ left sln bx;  no chemoradiation   Cataracts, bilateral    Depression    Diabetes mellitus without complication (HCC)    type 2    GAD (generalized anxiety disorder)    Gallbladder problem    History of ovarian cyst    IBS (irritable bowel syndrome)    Joint pain    PONV (postoperative nausea and vomiting)    does well with scop patch   Retinal detachment    Rheg OS   Right knee meniscal tear    Urgency of urination     Family History  Problem Relation Age of Onset   Colon polyps Mother        approx 2   Other Mother        hx HPV and hysterectomy due to precancerous cells   Depression Mother    Anxiety disorder Mother    Heart failure Father    Prostate cancer Father 41   Retinal detachment Father    Allergic rhinitis Sister    Other Sister 80       hx of hysterectomy for unspecified reason; still has ovaries   Other Sister 47       paternal half-sister hx of hysterectomy for unspecified reason; still has ovaries   Bladder Cancer Maternal Uncle 76       not a smoker   Lung cancer Maternal Uncle 37       smoker   Kidney failure Maternal Grandmother    Congestive Heart Failure Maternal Grandmother    Colon cancer Maternal Grandmother 3   Diabetes Maternal Grandmother    Lung cancer Maternal Grandfather 15       smoker   Breast cancer Paternal Grandmother        dx. early 68s; w/ hx of trauma to breast   Crohn's disease Daughter     Past Surgical History:  Procedure Laterality Date   ABDOMINAL HYSTERECTOMY  05/1996   endometriosis   ACHILLES TENDON REPAIR Right 2010;  revision 2011   BLADDER SUSPENSION  2000   BREAST BIOPSY Left 10/2015   BREAST IMPLANT REMOVAL Bilateral 08/12/2017   Procedure: REMOVAL BILATERAL BREAST IMPLANTS;  Surgeon: Peggye Form, DO;  Location: [facility];  Service: Plastics;  Laterality: Bilateral;   BREAST RECONSTRUCTION WITH PLACEMENT OF TISSUE EXPANDER AND FLEX HD (ACELLULAR HYDRATED DERMIS) Bilateral 01/09/2016   BREAST RECONSTRUCTION WITH PLACEMENT OF TISSUE EXPANDER AND FLEX HD (ACELLULAR HYDRATED DERMIS) Bilateral 01/09/2016   Procedure: BREAST  RECONSTRUCTION WITH PLACEMENT OF TISSUE EXPANDER AND FLEX HD (ACELLULAR HYDRATED DERMIS);  Surgeon: Alena Bills Dillingham, DO;  [facility];  Service: Plastics;  Laterality: Bilateral;   BREAST RECONSTRUCTION WITH PLACEMENT OF TISSUE EXPANDER AND FLEX HD (ACELLULAR HYDRATED DERMIS) Bilateral 05/29/2016   Procedure: PLACEMENT OF BILATERAL TISSUE EXPANDER AND FLEX HD (ACELLULAR HYDRATED DERMIS);  Surgeon: Peggye Form, DO;  Location: [facility];  Service: Plastics;  Laterality: Bilateral;   BREAST REDUCTION SURGERY Bilateral 11/26/2016   Procedure: BILATERAL  BREAST CAPSULE CONTRACTURE RELASE;  Surgeon: Peggye Form, DO;  Location: [facility];  Service: Plastics;  Laterality: Bilateral;   CESAREAN SECTION  1987; 1989; 1991   ELBOW SURGERY Right x3   last one 02-01-2019 @ [facility]   EYE SURGERY Left 10/01/2020   Pneumatic retinopexy for repair of rheg RD - Dr. Rennis Chris   EYE SURGERY Left 10/04/2020   PPV - Dr. Rennis Chris   FAT GRAFTING BILATERAL BREAST  08-09-2018  @[facility]    GAS INSERTION Left 10/04/2020   Procedure: INSERTION OF GAS;  Surgeon: Rennis Chris, MD;  Location[facility];  Service: Ophthalmology;  Laterality: Left;   GAS/FLUID EXCHANGE Left 10/04/2020   Procedure: GAS/FLUID EXCHANGE;  Surgeon: Rennis Chris, MD;  Location: [facility] OR;  Service: Ophthalmology;  Laterality: Left;   INCISION AND DRAINAGE OF WOUND Bilateral 02/11/2016   Procedure: IRRIGATION AND DEBRIDEMENT OF BILATERAL BREAST POCKET;  Surgeon: Peggye Form, DO;  Location: [facility];  Service: Plastics;  Laterality: Bilateral;   KNEE ARTHROSCOPY WITH MEDIAL MENISECTOMY Right 12/20/2019   Procedure: KNEE ARTHROSCOPY WITH MEDIAL MENISECTOMY;  Surgeon: Teryl Lucy, MD;  Location: [facility];  Service: Orthopedics;  Laterality: Right;   KNEE ARTHROSCOPY WITH MEDIAL MENISECTOMY Right 10/30/2021   Procedure: RIGHT KNEE ARTHROSCOPY WITH PARTIAL MEDIAL  MENISECTOMY SYNOVECTOMY;  Surgeon: Tarry Kos, MD;  Location: [facility];  Service: Orthopedics;  Laterality: Right;   LAPAROSCOPIC APPENDECTOMY  04-07-2011   @[facility]    w/ Excision peritoneal lipoma and lysis adhesions   LAPAROSCOPIC CHOLECYSTECTOMY  ~ 1999   LASER PHOTO ABLATION Right 10/04/2020   Procedure: LASER RETINOPEXY WITH INDIRECT LASER OPTHALMOSCOPE, RIGHT EYE;  Surgeon: Rennis Chris, MD;  Location: [facility] OR;  Service: Ophthalmology;  Laterality: Right;   LIPOSUCTION WITH LIPOFILLING Bilateral 11/26/2016   Procedure: LIPOFILLING FOR SYMMETRY;  Surgeon: Peggye Form, DO;  Location: [facility];  Service: Plastics;  Laterality: Bilateral;   LIPOSUCTION WITH LIPOFILLING Bilateral 01/21/2017   Procedure: BILATERAL BREAST  LIPOFILLING FOR ASYMMETRY;  Surgeon: Peggye Form, DO;  Location: [facility];  Service: Plastics;  Laterality: Bilateral;   LIPOSUCTION WITH LIPOFILLING Bilateral 06/28/2020   Procedure: Lipofilling bilateral breasts for asymmetry;  Surgeon: Peggye Form, DO;  Location: [facility];  Service: Plastics;  Laterality: Bilateral;  90 min   MASTECTOMY Bilateral 01/09/2016   NIPPLE SPARING MASTECTOMY/SENTINAL LYMPH NODE BIOPSY/RECONSTRUCTION/PLACEMENT OF TISSUE EXPANDER Bilateral 01/09/2016   Procedure: BILATERAL NIPPLE SPARING MASTECTOMY WITH LEFT SENTINAL LYMPH NODE BIOPSY ;  Surgeon: Almond Lint, MD;  [facility];  Service: General;  Laterality: Bilateral;   PHOTOCOAGULATION WITH LASER Left 10/04/2020   Procedure: PHOTOCOAGULATION WITH LASER;  Surgeon: Rennis Chris, MD;  Location: [facility] OR;  Service: Ophthalmology;  Laterality: Left;   REMOVAL OF BILATERAL TISSUE EXPANDERS WITH PLACEMENT OF BILATERAL BREAST IMPLANTS Bilateral 08/20/2016   Procedure: REMOVAL OF BILATERAL TISSUE EXPANDERS WITH PLACEMENT OF BILATERAL SILICONE IMPLANTS;  Surgeon: Peggye Form, DO;  Location: [facility];   Service: Plastics;  Laterality: Bilateral;   REMOVAL OF BILATERAL TISSUE EXPANDERS WITH PLACEMENT OF BILATERAL BREAST IMPLANTS Bilateral 11/05/2017   Procedure: REMOVAL OF BILATERAL TISSUE EXPANDERS WITH PLACEMENT OF BILATERAL BREAST SILICONE IMPLANTS;  Surgeon: Peggye Form, DO;  Location: [facility];  Service: Plastics;  Laterality: Bilateral;   REMOVAL OF TISSUE EXPANDER Bilateral 02/11/2016   Procedure: REMOVAL OF BILATERAL TISSUE EXPANDERS AND FLEX HD REMOVAL;  Surgeon:  Alena Bills Dillingham, DO;  Location: [facility];  Service: Plastics;  Laterality: Bilateral;   RETINAL DETACHMENT SURGERY Left 10/01/2020   Pneumatic retinopexy for repair of rheg RD - Dr. Rennis Chris   RETINAL DETACHMENT SURGERY Left 10/04/2020   PPV - Dr. Rennis Chris   SCLERAL BUCKLE Left 10/04/2020   Procedure: SCLERAL BUCKLE LEFT EYE;  Surgeon: Rennis Chris, MD;  Location: [facility] OR;  Service: Ophthalmology;  Laterality: Left;   TISSUE EXPANDER PLACEMENT Bilateral 08/12/2017   Procedure: PLACEMENT OF BILATERAL TISSUE EXPANDER;  Surgeon: Peggye Form, DO;  Location: [facility];  Service: Plastics;  Laterality: Bilateral;   TOTAL KNEE ARTHROPLASTY Right 03/31/2022   Procedure: RIGHT TOTAL KNEE REPLACEMENT;  Surgeon: Tarry Kos, MD;  [facility];  Service: Orthopedics;  Laterality: Right;   TUBAL LIGATION Bilateral 1991   VITRECTOMY 25 GAUGE WITH SCLERAL BUCKLE Left 10/04/2020   Procedure: 25 GAUGE PARS PLANA VITRECTOMY LEFT EYE ;  Surgeon: Rennis Chris, MD;  Location: [facility] OR;  Service: Ophthalmology;  Laterality: Left;   Social History   Occupational History   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

## 2023-04-13 ENCOUNTER — Other Ambulatory Visit: Payer: Self-pay | Admitting: Family Medicine

## 2023-04-13 DIAGNOSIS — F321 Major depressive disorder, single episode, moderate: Secondary | ICD-10-CM | POA: Insufficient documentation

## 2023-04-13 DIAGNOSIS — F411 Generalized anxiety disorder: Secondary | ICD-10-CM

## 2023-04-13 DIAGNOSIS — F320 Major depressive disorder, single episode, mild: Secondary | ICD-10-CM

## 2023-04-13 MED ORDER — DESVENLAFAXINE SUCCINATE ER 25 MG PO TB24
25.0000 mg | ORAL_TABLET | Freq: Every day | ORAL | 1 refills | Status: DC
Start: 2023-04-13 — End: 2023-05-20

## 2023-04-23 ENCOUNTER — Ambulatory Visit: Payer: 59 | Admitting: Physician Assistant

## 2023-05-05 ENCOUNTER — Ambulatory Visit (INDEPENDENT_AMBULATORY_CARE_PROVIDER_SITE_OTHER): Payer: 59 | Admitting: Physician Assistant

## 2023-05-05 DIAGNOSIS — M79671 Pain in right foot: Secondary | ICD-10-CM | POA: Diagnosis not present

## 2023-05-05 NOTE — Progress Notes (Signed)
Office Visit Note   Patient: Christina [name]           Date of Birth: 09-27-1965           MRN: [id number] Visit Date: 05/05/2023              Requested by: [name], PA [address] [place],  [place] [address] PCP: [name], PA   Assessment & Plan: Visit Diagnoses:  1. Pain in right foot     Plan: Impression is chronic right lateral foot pain without improvement with use of cam boot/prescription medication.  It is hard to tell whether she is symptomatic from possible remote cuboid fracture versus os peroneum.  At this point, we would like to further assess for structural abnormalities.  She will follow-up with Dr. Roda Shutters once this has been completed.  In the meantime, she will continue ambulating in a cam boot.  Call with concerns or questions in the meantime.  Follow-Up Instructions: Return for f/u after mri.   Orders:  No orders of the defined types were placed in this encounter.  No orders of the defined types were placed in this encounter.     Procedures: No procedures performed   Clinical Data: No additional findings.   Subjective: Chief Complaint  Patient presents with   Right Foot - Follow-up    HPI patient is a pleasant 57 year old female who comes in today with chronic right lateral foot pain.  Symptoms have been ongoing for over 4 months now.  She denies ever having an injury or change in activity.  Her symptoms have not improved but have in fact worsened over the past week as she is transition to a stand-up job.  The pain continues to be located to the lateral foot.  She has a stinging/burning pain at rest with worsening symptoms when she is standing or walking.  She is taking tramadol without significant relief.  She has been wearing a postop shoe without improvement.  Review of Systems as detailed in HPI.  All others reviewed and are negative.   Objective: Vital Signs: LMP  (LMP Unknown)   Physical Exam well-developed  well-nourished female in no acute distress.  Alert and oriented x 3.  Ortho Exam right foot exam shows moderate tenderness over the proximal fifth metatarsal and into the cuboid.  Specialty Comments:  No specialty comments available.  Imaging: No new imaging   PMFS History: Patient Active Problem List   Diagnosis Date Noted   Current mild episode of major depressive disorder without prior episode (HCC) 04/13/2023   Chronic rhinitis 03/30/2023   Elevated blood pressure reading 03/30/2023   GAD (generalized anxiety disorder) 03/26/2023   Urinary frequency 03/17/2023   Numbness and tingling in right hand 02/12/2023   Numbness of toes 02/12/2023   Pruritus 02/12/2023   Primary insomnia 02/12/2023   DVT (deep venous thrombosis) (HCC) 05/11/2022   Acute pulmonary embolism (HCC) 05/09/2022   Elevated troponin 05/09/2022   Status post total right knee replacement 03/31/2022   Primary osteoarthritis of right knee 03/20/2022   Eating disorder 03/20/2022   BMI 31.0-31.9,adult 12/31/2020   Vitamin B12 deficiency 10/08/2020   Mood disorder (HCC), with emotional eating 09/24/2020   Vitamin D deficiency 07/02/2020   Stress due to illness of family member-  daughter with drug addiction 05/15/2020   Depression, recurrent (HCC) 05/15/2020   Anemia 05/15/2020   Type 2 diabetes mellitus (HCC) 05/15/2020   Breast asymmetry following reconstructive  surgery 04/24/2020   Acquired absence of breast 04/24/2020   S/P breast reconstruction, bilateral 04/24/2020   Breast cancer, left (HCC) 01/09/2016   Monoallelic mutation of ATM gene 66/44/0347   Malignant neoplasm of central portion of left breast in female, estrogen receptor positive (HCC) 10/31/2015   Endometriosis 03/11/2011   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,  multifocal IDC, DCIS, ER/PR+,  01-09-2016 s/p  bilteral mastectomies w/ left sln bx;  no chemoradiation   Cataracts, bilateral    Depression    Diabetes mellitus without complication (HCC)    type 2   GAD (generalized anxiety disorder)    Gallbladder problem    History of ovarian cyst    IBS (irritable bowel syndrome)    Joint pain    PONV (postoperative nausea and vomiting)    does well with scop patch   Retinal detachment    Rheg OS   Right knee meniscal tear    Urgency of urination     Family History  Problem Relation Age of Onset   Colon polyps Mother        approx 2   Other Mother        hx HPV and hysterectomy due to precancerous cells   Depression Mother    Anxiety disorder Mother    Heart failure Father    Prostate cancer Father 71   Retinal detachment Father    Allergic rhinitis Sister    Other Sister 61       hx of hysterectomy for unspecified reason; still has ovaries   Other Sister 25       paternal half-sister hx of hysterectomy for unspecified reason; still has ovaries   Bladder Cancer Maternal Uncle 76       not a smoker   Lung cancer Maternal Uncle 37       smoker   Kidney failure Maternal Grandmother    Congestive Heart Failure Maternal Grandmother    Colon cancer Maternal Grandmother 69   Diabetes Maternal Grandmother    Lung cancer Maternal Grandfather 3       smoker   Breast cancer Paternal Grandmother        dx. early 36s; w/ hx of trauma to breast   Crohn's disease Daughter     Past Surgical History:  Procedure Laterality Date   ABDOMINAL HYSTERECTOMY  05/1996   endometriosis   ACHILLES TENDON REPAIR Right 2010;  revision 2011   BLADDER SUSPENSION  2000   BREAST BIOPSY Left 10/2015   BREAST IMPLANT REMOVAL Bilateral 08/12/2017   Procedure: REMOVAL BILATERAL BREAST IMPLANTS;  Surgeon: Peggye Form, DO;  Location: [facility];  Service: Plastics;  Laterality: Bilateral;   BREAST RECONSTRUCTION WITH PLACEMENT OF TISSUE EXPANDER AND FLEX HD (ACELLULAR HYDRATED DERMIS)  Bilateral 01/09/2016   BREAST RECONSTRUCTION WITH PLACEMENT OF TISSUE EXPANDER AND FLEX HD (ACELLULAR HYDRATED DERMIS) Bilateral 01/09/2016   Procedure: BREAST RECONSTRUCTION WITH PLACEMENT OF TISSUE EXPANDER AND FLEX HD (ACELLULAR HYDRATED DERMIS);  Surgeon: Alena Bills Dillingham, DO;  [facility];  Service: Plastics;  Laterality: Bilateral;   BREAST RECONSTRUCTION WITH PLACEMENT OF TISSUE EXPANDER AND FLEX HD (ACELLULAR HYDRATED DERMIS) Bilateral 05/29/2016   Procedure: PLACEMENT OF BILATERAL TISSUE EXPANDER AND FLEX HD (ACELLULAR HYDRATED DERMIS);  Surgeon: Alena Bills Dillingham, DO;  Location: [facility];  Service: Plastics;  Laterality: Bilateral;   BREAST REDUCTION SURGERY Bilateral 11/26/2016   Procedure: BILATERAL BREAST CAPSULE CONTRACTURE RELASE;  Surgeon: Peggye Form, DO;  Location: [facility];  Service: Plastics;  Laterality: Bilateral;   CESAREAN SECTION  1987; 1989; 1991   ELBOW SURGERY Right x3   last one 02-01-2019 @ [facility]   EYE SURGERY Left 10/01/2020   Pneumatic retinopexy for repair of rheg RD - Dr. Rennis Chris   EYE SURGERY Left 10/04/2020   PPV - Dr. Rennis Chris   FAT GRAFTING BILATERAL BREAST  08-09-2018  @[facility]    GAS INSERTION Left 10/04/2020   Procedure: INSERTION OF GAS;  Surgeon: Rennis Chris, MD;  Location: [facility] OR;  Service: Ophthalmology;  Laterality: Left;   GAS/FLUID EXCHANGE Left 10/04/2020   Procedure: GAS/FLUID EXCHANGE;  Surgeon: Rennis Chris, MD;  Location: [facility] OR;  Service: Ophthalmology;  Laterality: Left;   INCISION AND DRAINAGE OF WOUND Bilateral 02/11/2016   Procedure: IRRIGATION AND DEBRIDEMENT OF BILATERAL BREAST POCKET;  Surgeon: Peggye Form, DO;  Location: [facility];  Service: Plastics;  Laterality: Bilateral;   KNEE ARTHROSCOPY WITH MEDIAL MENISECTOMY Right 12/20/2019   Procedure: KNEE ARTHROSCOPY WITH MEDIAL MENISECTOMY;  Surgeon: Teryl Lucy, MD;  Location: [facility] [place];  Service:  Orthopedics;  Laterality: Right;   KNEE ARTHROSCOPY WITH MEDIAL MENISECTOMY Right 10/30/2021   Procedure: RIGHT KNEE ARTHROSCOPY WITH PARTIAL MEDIAL MENISECTOMY SYNOVECTOMY;  Surgeon: Tarry Kos, MD;  Location: [facility];  Service: Orthopedics;  Laterality: Right;   LAPAROSCOPIC APPENDECTOMY  04-07-2011   @[facility]    w/ Excision peritoneal lipoma and lysis adhesions   LAPAROSCOPIC CHOLECYSTECTOMY  ~ 1999   LASER PHOTO ABLATION Right 10/04/2020   Procedure: LASER RETINOPEXY WITH INDIRECT LASER OPTHALMOSCOPE, RIGHT EYE;  Surgeon: Rennis Chris, MD;  Location: [facility] OR;  Service: Ophthalmology;  Laterality: Right;   LIPOSUCTION WITH LIPOFILLING Bilateral 11/26/2016   Procedure: LIPOFILLING FOR SYMMETRY;  Surgeon: Peggye Form, DO;  Location: [facility];  Service: Plastics;  Laterality: Bilateral;   LIPOSUCTION WITH LIPOFILLING Bilateral 01/21/2017   Procedure: BILATERAL BREAST  LIPOFILLING FOR ASYMMETRY;  Surgeon: Peggye Form, DO;  Location: [facility];  Service: Plastics;  Laterality: Bilateral;   LIPOSUCTION WITH LIPOFILLING Bilateral 06/28/2020   Procedure: Lipofilling bilateral breasts for asymmetry;  Surgeon: Peggye Form, DO;  Location: [facility];  Service: Plastics;  Laterality: Bilateral;  90 min   MASTECTOMY Bilateral 01/09/2016   NIPPLE SPARING MASTECTOMY/SENTINAL LYMPH NODE BIOPSY/RECONSTRUCTION/PLACEMENT OF TISSUE EXPANDER Bilateral 01/09/2016   Procedure: BILATERAL NIPPLE SPARING MASTECTOMY WITH LEFT SENTINAL LYMPH NODE BIOPSY ;  Surgeon: Almond Lint, MD;  [facility];  Service: General;  Laterality: Bilateral;   PHOTOCOAGULATION WITH LASER Left 10/04/2020   Procedure: PHOTOCOAGULATION WITH LASER;  Surgeon: Rennis Chris, MD;  Location: [facility] OR;  Service: Ophthalmology;  Laterality: Left;   REMOVAL OF BILATERAL TISSUE EXPANDERS WITH PLACEMENT OF BILATERAL BREAST IMPLANTS Bilateral 08/20/2016   Procedure: REMOVAL  OF BILATERAL TISSUE EXPANDERS WITH PLACEMENT OF BILATERAL SILICONE IMPLANTS;  Surgeon: Peggye Form, DO;  Location: [facility];  Service: Plastics;  Laterality: Bilateral;   REMOVAL OF BILATERAL TISSUE EXPANDERS WITH PLACEMENT OF BILATERAL BREAST IMPLANTS Bilateral 11/05/2017   Procedure: REMOVAL OF BILATERAL TISSUE EXPANDERS WITH PLACEMENT OF BILATERAL BREAST SILICONE IMPLANTS;  Surgeon: Peggye Form, DO;  Location: [facility];  Service: Plastics;  Laterality: Bilateral;   REMOVAL  OF TISSUE EXPANDER Bilateral 02/11/2016   Procedure: REMOVAL OF BILATERAL TISSUE EXPANDERS AND FLEX HD REMOVAL;  Surgeon: Peggye Form, DO;  Location: [facility];  Service: Plastics;  Laterality: Bilateral;   RETINAL DETACHMENT SURGERY Left 10/01/2020   Pneumatic retinopexy for repair of rheg RD - Dr. Rennis Chris   RETINAL DETACHMENT SURGERY Left 10/04/2020   PPV - Dr. Rennis Chris   SCLERAL BUCKLE Left 10/04/2020   Procedure: SCLERAL BUCKLE LEFT EYE;  Surgeon: Rennis Chris, MD;  Location[facility];  Service: Ophthalmology;  Laterality: Left;   TISSUE EXPANDER PLACEMENT Bilateral 08/12/2017   Procedure: PLACEMENT OF BILATERAL TISSUE EXPANDER;  Surgeon: Peggye Form, DO;  Location: [facility];  Service: Plastics;  Laterality: Bilateral;   TOTAL KNEE ARTHROPLASTY Right 03/31/2022   Procedure: RIGHT TOTAL KNEE REPLACEMENT;  Surgeon: Tarry Kos, MD;  [facility];  Service: Orthopedics;  Laterality: Right;   TUBAL LIGATION Bilateral 1991   VITRECTOMY 25 GAUGE WITH SCLERAL BUCKLE Left 10/04/2020   Procedure: 25 GAUGE PARS PLANA VITRECTOMY LEFT EYE ;  Surgeon: Rennis Chris, MD;  Location: [facility] OR;  Service: Ophthalmology;  Laterality: Left;   Social History   Occupational History   Occupation: Cabin crew: Astronomer  Tobacco Use   Smoking status: Former    Current packs/day: 0.00    Average packs/day: 1  pack/day for 10.0 years (10.0 ttl pk-yrs)    Types: Cigarettes    Start date: 06/08/1998    Quit date: 06/08/2008    Years since quitting: 14.9    Passive exposure: Never   Smokeless tobacco: Never  Vaping Use   Vaping status: Never Used  Substance and Sexual Activity   Alcohol use: No   Drug use: Never   Sexual activity: Never    Birth control/protection: Surgical    Comment: hysterectomy

## 2023-05-07 ENCOUNTER — Other Ambulatory Visit: Payer: Self-pay | Admitting: Physician Assistant

## 2023-05-07 ENCOUNTER — Telehealth: Payer: Self-pay | Admitting: Physician Assistant

## 2023-05-07 MED ORDER — TRAMADOL HCL 50 MG PO TABS: 50.0000 mg | ORAL_TABLET | Freq: Two times a day (BID) | ORAL | 2 refills | Status: DC | PRN

## 2023-05-07 NOTE — Telephone Encounter (Signed)
sent 

## 2023-05-07 NOTE — Telephone Encounter (Signed)
Called and notified patient via voicemail.  

## 2023-05-07 NOTE — Telephone Encounter (Signed)
Pt called requesting pain medication. Please send to [facility]. Pt phone number is [phone].

## 2023-05-15 ENCOUNTER — Encounter: Payer: Self-pay | Admitting: Physician Assistant

## 2023-05-19 ENCOUNTER — Ambulatory Visit: Payer: 59 | Attending: Family Medicine

## 2023-05-19 ENCOUNTER — Encounter: Payer: Self-pay | Admitting: Physician Assistant

## 2023-05-20 ENCOUNTER — Encounter: Payer: Self-pay | Admitting: Family Medicine

## 2023-05-20 ENCOUNTER — Ambulatory Visit (INDEPENDENT_AMBULATORY_CARE_PROVIDER_SITE_OTHER): Payer: 59 | Admitting: Family Medicine

## 2023-05-20 VITALS — BP 128/77 | HR 67 | Resp 18 | Ht 67.0 in | Wt 218.0 lb

## 2023-05-20 DIAGNOSIS — Z0000 Encounter for general adult medical examination without abnormal findings: Secondary | ICD-10-CM | POA: Diagnosis not present

## 2023-05-20 DIAGNOSIS — F321 Major depressive disorder, single episode, moderate: Secondary | ICD-10-CM

## 2023-05-20 DIAGNOSIS — R0683 Snoring: Secondary | ICD-10-CM | POA: Insufficient documentation

## 2023-05-20 DIAGNOSIS — F411 Generalized anxiety disorder: Secondary | ICD-10-CM

## 2023-05-20 DIAGNOSIS — Z1159 Encounter for screening for other viral diseases: Secondary | ICD-10-CM

## 2023-05-20 DIAGNOSIS — E119 Type 2 diabetes mellitus without complications: Secondary | ICD-10-CM | POA: Diagnosis not present

## 2023-05-20 MED ORDER — DESVENLAFAXINE SUCCINATE ER 25 MG PO TB24
25.0000 mg | ORAL_TABLET | Freq: Every day | ORAL | 1 refills | Status: DC
Start: 2023-05-20 — End: 2023-05-20

## 2023-05-20 MED ORDER — DESVENLAFAXINE SUCCINATE ER 50 MG PO TB24
50.0000 mg | ORAL_TABLET | Freq: Every day | ORAL | 3 refills | Status: DC
Start: 2023-05-20 — End: 2023-09-08

## 2023-05-20 NOTE — Assessment & Plan Note (Addendum)
Patient had some issues obtaining prescription for desvenlafaxine.  In the past, she has tried Zoloft, Wellbutrin, and Rexulti for depression but felt that none of them were effective. She does not want anything like Xanax.  Initiating trial of desvenlafaxine 50 mg daily for both anxiety and depression.  PHQ-9 score of 10, GAD-7 score of 13 today.

## 2023-05-20 NOTE — Assessment & Plan Note (Signed)
PHQ-9 score of 10.  Initiate trial of desvenlafaxine 50 mg daily for MDD and GAD.  Follow-up in 2 months to assess efficacy.  Will continue to monitor.

## 2023-05-20 NOTE — Assessment & Plan Note (Signed)
Most recent A1c 6.7, at goal.  Will complete urine microalbumin, foot exam at next appointment.  Patient is due for diabetic eye exam.  -A1c goal <7.0 -Annual urine microalbumin, foot exam, diabetic eye exam, labs -Office visit every 4 months, if well-controlled may consider every 6 months

## 2023-05-20 NOTE — Assessment & Plan Note (Signed)
Patient's snoring and description of what sounds like apneic episodes warrant sleep study.  Placed order today and will treat according to results.

## 2023-05-20 NOTE — Progress Notes (Signed)
Complete physical exam  Patient: Christina [name]   DOB: [birth year]   57 y.o. Female  MRN: [id number]  Subjective:    Chief Complaint  Patient presents with   Annual Exam    Christina [name] is a 57 y.o. female who presents today for a complete physical exam. She reports consuming a general diet. The patient does not participate in regular exercise at present.  She is often on her feet as she now works at the [employer] daily and is standing all day.  She generally feels poorly today, she developed URI symptoms, achiness, and a fever yesterday.  The fever has improved but she is still experiencing nasal congestion and slight achiness. She reports sleeping fairly well, though she is concerned because her husband has recently complained about her significant snoring and she also sometimes wakes up after laying on her back feeling like she cannot breathe or has stopped breathing. She does not have additional problems to discuss today.    Most recent fall risk assessment:     No data to display           Most recent depression and anxiety screenings:    05/20/2023   10:58 AM 02/12/2023    8:37 AM  PHQ 2/9 Scores  PHQ - 2 Score 0 2  PHQ- 9 Score 10 9      05/20/2023   10:58 AM 02/12/2023    8:38 AM  GAD 7 : Generalized Anxiety Score  Nervous, Anxious, on Edge 1 0  Control/stop worrying 3 3  Worry too much - different things 3 2  Trouble relaxing 3 3  Restless 0 2  Easily annoyed or irritable 3 2  Afraid - awful might happen 0 3  Total GAD 7 Score 13 15  Anxiety Difficulty Somewhat difficult Somewhat difficult    Patient Active Problem List   Diagnosis Date Noted   Snoring 05/20/2023   MDD (major depressive disorder), single episode, moderate (HCC) 04/13/2023   Chronic rhinitis 03/30/2023   Elevated blood pressure reading 03/30/2023   GAD (generalized anxiety disorder) 03/26/2023   Urinary frequency 03/17/2023   Numbness and tingling in right hand 02/12/2023   Numbness of  toes 02/12/2023   Pruritus 02/12/2023   Primary insomnia 02/12/2023   DVT (deep venous thrombosis) (HCC) 05/11/2022   Acute pulmonary embolism (HCC) 05/09/2022   Elevated troponin 05/09/2022   Status post total right knee replacement 03/31/2022   Primary osteoarthritis of right knee 03/20/2022   Eating disorder 03/20/2022   BMI 31.0-31.9,adult 12/31/2020   Vitamin B12 deficiency 10/08/2020   Vitamin D deficiency 07/02/2020   Stress due to illness of family member-  daughter with drug addiction 05/15/2020   Anemia 05/15/2020   Type 2 diabetes mellitus (HCC) 05/15/2020   Breast asymmetry following reconstructive surgery 04/24/2020   Acquired absence of breast 04/24/2020   S/P breast reconstruction, bilateral 04/24/2020   Breast cancer, left (HCC) 01/09/2016   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   Malignant neoplasm of central portion of left breast in female, estrogen receptor positive (HCC) 10/31/2015   Endometriosis 03/11/2011    Past Surgical History:  Procedure Laterality Date   ABDOMINAL HYSTERECTOMY  05/1996   endometriosis   ACHILLES TENDON REPAIR Right 2010;  revision 2011   BILATERAL TOTAL MASTECTOMY WITH AXILLARY LYMPH NODE DISSECTION     BLADDER SUSPENSION  2000   BREAST BIOPSY Left 10/2015   BREAST IMPLANT REMOVAL Bilateral 08/12/2017   Procedure: REMOVAL BILATERAL  BREAST IMPLANTS;  Surgeon: Peggye Form, DO;  Location: [facility];  Service: Plastics;  Laterality: Bilateral;   BREAST RECONSTRUCTION WITH PLACEMENT OF TISSUE EXPANDER AND FLEX HD (ACELLULAR HYDRATED DERMIS) Bilateral 01/09/2016   BREAST RECONSTRUCTION WITH PLACEMENT OF TISSUE EXPANDER AND FLEX HD (ACELLULAR HYDRATED DERMIS) Bilateral 01/09/2016   Procedure: BREAST RECONSTRUCTION WITH PLACEMENT OF TISSUE EXPANDER AND FLEX HD (ACELLULAR HYDRATED DERMIS);  Surgeon: Alena Bills Dillingham, DO;  Location: [facility] OR;  Service: Plastics;  Laterality: Bilateral;   BREAST RECONSTRUCTION WITH  PLACEMENT OF TISSUE EXPANDER AND FLEX HD (ACELLULAR HYDRATED DERMIS) Bilateral 05/29/2016   Procedure: PLACEMENT OF BILATERAL TISSUE EXPANDER AND FLEX HD (ACELLULAR HYDRATED DERMIS);  Surgeon: Peggye Form, DO;  Location: [facility];  Service: Plastics;  Laterality: Bilateral;   BREAST REDUCTION SURGERY Bilateral 11/26/2016   Procedure: BILATERAL BREAST CAPSULE CONTRACTURE RELASE;  Surgeon: Peggye Form, DO;  Location: [facility];  Service: Plastics;  Laterality: Bilateral;   CESAREAN SECTION  1987; 1989; 1991   ELBOW SURGERY Right x3   last one 02-01-2019 @ [facility]   EYE SURGERY Left 10/01/2020   Pneumatic retinopexy for repair of rheg RD - Dr. Rennis Chris   EYE SURGERY Left 10/04/2020   PPV - Dr. Rennis Chris   FAT GRAFTING BILATERAL BREAST  08-09-2018  @[facility]    GAS INSERTION Left 10/04/2020   Procedure: INSERTION OF GAS;  Surgeon: Rennis Chris, MD;  Location: [facility] OR;  Service: Ophthalmology;  Laterality: Left;   GAS/FLUID EXCHANGE Left 10/04/2020   Procedure: GAS/FLUID EXCHANGE;  Surgeon: Rennis Chris, MD;  Location: [facility] OR;  Service: Ophthalmology;  Laterality: Left;   INCISION AND DRAINAGE OF WOUND Bilateral 02/11/2016   Procedure: IRRIGATION AND DEBRIDEMENT OF BILATERAL BREAST POCKET;  Surgeon: Peggye Form, DO;  Location: [facility];  Service: Plastics;  Laterality: Bilateral;   KNEE ARTHROSCOPY WITH MEDIAL MENISECTOMY Right 12/20/2019   Procedure: KNEE ARTHROSCOPY WITH MEDIAL MENISECTOMY;  Surgeon: Teryl Lucy, MD;  Location: [facility];  Service: Orthopedics;  Laterality: Right;   KNEE ARTHROSCOPY WITH MEDIAL MENISECTOMY Right 10/30/2021   Procedure: RIGHT KNEE ARTHROSCOPY WITH PARTIAL MEDIAL MENISECTOMY SYNOVECTOMY;  Surgeon: Tarry Kos, MD;  Location: [facility];  Service: Orthopedics;  Laterality: Right;   LAPAROSCOPIC APPENDECTOMY  04-07-2011   @[facility]    w/ Excision peritoneal lipoma  and lysis adhesions   LAPAROSCOPIC CHOLECYSTECTOMY  ~ 1999   LASER PHOTO ABLATION Right 10/04/2020   Procedure: LASER RETINOPEXY WITH INDIRECT LASER OPTHALMOSCOPE, RIGHT EYE;  Surgeon: Rennis Chris, MD;  Location: [facility] OR;  Service: Ophthalmology;  Laterality: Right;   LIPOSUCTION WITH LIPOFILLING Bilateral 11/26/2016   Procedure: LIPOFILLING FOR SYMMETRY;  Surgeon: Peggye Form, DO;  Location: [facility];  Service: Plastics;  Laterality: Bilateral;   LIPOSUCTION WITH LIPOFILLING Bilateral 01/21/2017   Procedure: BILATERAL BREAST  LIPOFILLING FOR ASYMMETRY;  Surgeon: Peggye Form, DO;  Location: [facility];  Service: Plastics;  Laterality: Bilateral;   LIPOSUCTION WITH LIPOFILLING Bilateral 06/28/2020   Procedure: Lipofilling bilateral breasts for asymmetry;  Surgeon: Peggye Form, DO;  Location: [facility];  Service: Plastics;  Laterality: Bilateral;  90 min   MASTECTOMY Bilateral 01/09/2016   NIPPLE SPARING MASTECTOMY/SENTINAL LYMPH NODE BIOPSY/RECONSTRUCTION/PLACEMENT OF TISSUE EXPANDER Bilateral 01/09/2016   Procedure: BILATERAL NIPPLE SPARING MASTECTOMY WITH LEFT SENTINAL LYMPH NODE BIOPSY ;  Surgeon: Almond Lint, MD;  Location: [facility];  Service: General;  Laterality:  Bilateral;   PHOTOCOAGULATION WITH LASER Left 10/04/2020   Procedure: PHOTOCOAGULATION WITH LASER;  Surgeon: Rennis Chris, MD;  Location: [facility] OR;  Service: Ophthalmology;  Laterality: Left;   REMOVAL OF BILATERAL TISSUE EXPANDERS WITH PLACEMENT OF BILATERAL BREAST IMPLANTS Bilateral 08/20/2016   Procedure: REMOVAL OF BILATERAL TISSUE EXPANDERS WITH PLACEMENT OF BILATERAL SILICONE IMPLANTS;  Surgeon: Peggye Form, DO;  Location: [facility];  Service: Plastics;  Laterality: Bilateral;   REMOVAL OF BILATERAL TISSUE EXPANDERS WITH PLACEMENT OF BILATERAL BREAST IMPLANTS Bilateral 11/05/2017   Procedure: REMOVAL OF BILATERAL TISSUE EXPANDERS WITH  PLACEMENT OF BILATERAL BREAST SILICONE IMPLANTS;  Surgeon: Peggye Form, DO;  Location: [facility];  Service: Plastics;  Laterality: Bilateral;   REMOVAL OF TISSUE EXPANDER Bilateral 02/11/2016   Procedure: REMOVAL OF BILATERAL TISSUE EXPANDERS AND FLEX HD REMOVAL;  Surgeon: Peggye Form, DO;  Location: [facility];  Service: Plastics;  Laterality: Bilateral;   RETINAL DETACHMENT SURGERY Left 10/01/2020   Pneumatic retinopexy for repair of rheg RD - Dr. Rennis Chris   RETINAL DETACHMENT SURGERY Left 10/04/2020   PPV - Dr. Rennis Chris   SCLERAL BUCKLE Left 10/04/2020   Procedure: SCLERAL BUCKLE LEFT EYE;  Surgeon: Rennis Chris, MD;  Location: [facility] OR;  Service: Ophthalmology;  Laterality: Left;   TISSUE EXPANDER PLACEMENT Bilateral 08/12/2017   Procedure: PLACEMENT OF BILATERAL TISSUE EXPANDER;  Surgeon: Peggye Form, DO;  Location: [facility];  Service: Plastics;  Laterality: Bilateral;   TOTAL KNEE ARTHROPLASTY Right 03/31/2022   Procedure: RIGHT TOTAL KNEE REPLACEMENT;  Surgeon: Tarry Kos, MD;  Location: [facility];  Service: Orthopedics;  Laterality: Right;   TUBAL LIGATION Bilateral 1991   VITRECTOMY 25 GAUGE WITH SCLERAL BUCKLE Left 10/04/2020   Procedure: 25 GAUGE PARS PLANA VITRECTOMY LEFT EYE ;  Surgeon: Rennis Chris, MD;  Location: [facility] OR;  Service: Ophthalmology;  Laterality: Left;   Social History   Tobacco Use   Smoking status: Former    Current packs/day: 0.00    Average packs/day: 1 pack/day for 10.0 years (10.0 ttl pk-yrs)    Types: Cigarettes    Start date: 06/08/1998    Quit date: 06/08/2008    Years since quitting: 14.9    Passive exposure: Never   Smokeless tobacco: Never  Vaping Use   Vaping status: Never Used  Substance Use Topics   Alcohol use: No   Drug use: Never   Family History  Problem Relation Age of Onset   Colon polyps Mother        approx 2   Other Mother        hx HPV and  hysterectomy due to precancerous cells   Depression Mother    Anxiety disorder Mother    Heart failure Father    Prostate cancer Father 37   Retinal detachment Father    Allergic rhinitis Sister    Other Sister 43       hx of hysterectomy for unspecified reason; still has ovaries   Other Sister 20       paternal half-sister hx of hysterectomy for unspecified reason; still has ovaries   Bladder Cancer Maternal Uncle 70       not a smoker   Lung cancer Maternal Uncle 37       smoker   Kidney failure Maternal Grandmother    Congestive Heart Failure Maternal Grandmother    Colon cancer Maternal Grandmother 71   Diabetes Maternal Grandmother  Lung cancer Maternal Grandfather 78       smoker   Breast cancer Paternal Grandmother        dx. early 45s; w/ hx of trauma to breast   Crohn's disease Daughter    No Known Allergies   Patient Care Team: [name], PA as PCP - General (Family Medicine) Jerene Bears, MD as Consulting Physician (Gynecology) Salomon Fick, NP as Nurse Practitioner (Hematology and Oncology)   Outpatient Medications Prior to Visit  Medication Sig   hydrOXYzine (ATARAX) 50 MG tablet Take 1/2 to 1 tablet (25 to 50 mg total) every 8 hours as needed for breakthrough anxiety.   oxybutynin (DITROPAN-XL) 5 MG 24 hr tablet Take 1 tablet (5 mg total) by mouth at bedtime. For urinary frequency.   traMADol (ULTRAM) 50 MG tablet Take 1 tablet (50 mg total) by mouth 2 (two) times daily as needed.   [DISCONTINUED] desvenlafaxine (PRISTIQ) 25 MG 24 hr tablet Take 1 tablet (25 mg total) by mouth daily.   [DISCONTINUED] Vitamin D, Ergocalciferol, (DRISDOL) 1.25 MG (50000 UNIT) CAPS capsule Take 1 capsule (50,000 Units total) by mouth every 7 (seven) days. (Patient not taking: Reported on 05/20/2023)   No facility-administered medications prior to visit.    Review of Systems  Constitutional:  Positive for fever (Yesterday, resolved). Negative for chills,  diaphoresis and malaise/fatigue.  HENT:  Positive for congestion. Negative for ear pain, hearing loss and sore throat.   Eyes:  Negative for blurred vision, double vision, pain and discharge.  Respiratory:  Positive for cough. Negative for shortness of breath.   Cardiovascular:  Negative for chest pain, palpitations and leg swelling.  Gastrointestinal:  Negative for abdominal pain, constipation, diarrhea and heartburn.  Genitourinary:  Negative for frequency and urgency.  Musculoskeletal:  Positive for myalgias. Negative for neck pain.  Neurological:  Negative for headaches.  Endo/Heme/Allergies:  Negative for polydipsia.  Psychiatric/Behavioral:  Negative for depression. The patient is not nervous/anxious and does not have insomnia.       Objective:    BP 128/77 (BP Location: Left Arm, Patient Position: Sitting, Cuff Size: Normal)   Pulse 67   Resp 18   Ht 5\' 7"  (1.702 m)   Wt 218 lb (98.9 kg)   LMP  (LMP Unknown)   SpO2 98%   BMI 34.14 kg/m    Physical Exam Constitutional:      General: She is not in acute distress.    Appearance: Normal appearance.  HENT:     Head: Normocephalic and atraumatic.     Right Ear: Tympanic membrane, ear canal and external ear normal. There is no impacted cerumen.     Left Ear: Tympanic membrane, ear canal and external ear normal. There is no impacted cerumen.     Nose: Congestion present.     Mouth/Throat:     Mouth: Mucous membranes are moist.     Pharynx: No oropharyngeal exudate or posterior oropharyngeal erythema.  Eyes:     Extraocular Movements: Extraocular movements intact.     Conjunctiva/sclera: Conjunctivae normal.     Pupils: Pupils are equal, round, and reactive to light.     Comments: Wears glasses  Neck:     Thyroid: No thyroid mass, thyromegaly or thyroid tenderness.  Cardiovascular:     Rate and Rhythm: Normal rate and regular rhythm.     Heart sounds: Normal heart sounds. No murmur heard.    No friction rub. No gallop.   Pulmonary:  Effort: Pulmonary effort is normal. No respiratory distress.     Breath sounds: Normal breath sounds. No wheezing, rhonchi or rales.  Abdominal:     General: Abdomen is flat. Bowel sounds are normal. There is no distension.     Palpations: There is no mass.     Tenderness: There is no abdominal tenderness. There is no guarding.  Musculoskeletal:        General: Normal range of motion.     Cervical back: Normal range of motion and neck supple.  Lymphadenopathy:     Cervical: No cervical adenopathy.  Skin:    General: Skin is warm and dry.  Neurological:     Mental Status: She is alert and oriented to person, place, and time.     Cranial Nerves: No cranial nerve deficit.     Motor: No weakness.     Deep Tendon Reflexes: Reflexes normal.  Psychiatric:        Mood and Affect: Mood normal.       Assessment & Plan:    Routine Health Maintenance and Physical Exam  Immunization History  Administered Date(s) Administered   Influenza Split 09/03/2009, 06/29/2012, 07/10/2014   Influenza,inj,Quad PF,6+ Mos 06/18/2016, 05/01/2017, 07/16/2018, 06/14/2019, 07/12/2020, 08/05/2022   Influenza-Unspecified 07/09/2014   PFIZER(Purple Top)SARS-COV-2 Vaccination 11/16/2019, 12/07/2019   Tdap 09/03/2009, 05/12/2019   Zoster, Live 08/05/2022    Health Maintenance  Topic Date Due   FOOT EXAM  Never done   OPHTHALMOLOGY EXAM  Never done   Diabetic kidney evaluation - Urine ACR  Never done   Hepatitis C Screening  Never done   Zoster Vaccines- Shingrix (1 of 2) 12/18/1986   PAP SMEAR-Modifier  Never done   Colonoscopy  Never done   COVID-19 Vaccine (3 - Pfizer risk series) 01/04/2020   INFLUENZA VACCINE  04/09/2023   HEMOGLOBIN A1C  08/14/2023   Diabetic kidney evaluation - eGFR measurement  03/29/2024   DTaP/Tdap/Td (3 - Td or Tdap) 05/11/2029   HIV Screening  Completed   HPV VACCINES  Aged Out    Reviewed most recent labs including CBC, CMP, lipid panel, A1C, TSH, and  vitamin D. All within normal limits. Since patient is not feeling well today, we will administer second shingles vaccine and flu vaccine at next appointment. Patient had a hysterectomy in 1997 but is not sure if it included removal of the cervix.  Requesting copies of surgical records from [facility] to confirm whether she is eligible for continued Pap smears or not. Bilateral mastectomy, no longer needs mammogram.  Discussed health benefits of physical activity, and encouraged her to engage in regular exercise appropriate for her age and condition.  Wellness examination  GAD (generalized anxiety disorder) Assessment & Plan: Patient had some issues obtaining prescription for desvenlafaxine.  In the past, she has tried Zoloft, Wellbutrin, and Rexulti for depression but felt that none of them were effective. She does not want anything like Xanax.  Initiating trial of desvenlafaxine 50 mg daily for both anxiety and depression.  PHQ-9 score of 10, GAD-7 score of 13 today.  Orders: -     Desvenlafaxine Succinate ER; Take 1 tablet (50 mg total) by mouth daily.  Dispense: 30 tablet; Refill: 3 -     CBC with Differential/Platelet; Future -     Comprehensive metabolic panel; Future  MDD (major depressive disorder), single episode, moderate (HCC) Assessment & Plan: PHQ-9 score of 10.  Initiate trial of desvenlafaxine 50 mg daily for MDD and GAD.  Follow-up in 2 months to assess efficacy.  Will continue to monitor.  Orders: -     Desvenlafaxine Succinate ER; Take 1 tablet (50 mg total) by mouth daily.  Dispense: 30 tablet; Refill: 3 -     CBC with Differential/Platelet; Future -     Comprehensive metabolic panel; Future  Snoring Assessment & Plan: Patient's snoring and description of what sounds like apneic episodes warrant sleep study.  Placed order today and will treat according to results.  Orders: -     Home sleep test; Future  Type 2 diabetes mellitus without complication,  without long-term current use of insulin (HCC) Assessment & Plan: Most recent A1c 6.7, at goal.  Will complete urine microalbumin, foot exam at next appointment.  Patient is due for diabetic eye exam.  -A1c goal <7.0 -Annual urine microalbumin, foot exam, diabetic eye exam, labs -Office visit every 4 months, if well-controlled may consider every 6 months  Orders: -     CBC with Differential/Platelet; Future -     Comprehensive metabolic panel; Future -     Hemoglobin A1c; Future  Screening for viral disease -     Hepatitis C antibody; Future    Return in about 2 months (around 07/20/2023) for follow-up for DM, mood, fasting blood work 1 week before.     [name], PA

## 2023-05-23 ENCOUNTER — Other Ambulatory Visit: Payer: 59

## 2023-05-24 ENCOUNTER — Ambulatory Visit (INDEPENDENT_AMBULATORY_CARE_PROVIDER_SITE_OTHER): Payer: 59

## 2023-05-24 DIAGNOSIS — M79671 Pain in right foot: Secondary | ICD-10-CM | POA: Diagnosis not present

## 2023-05-24 DIAGNOSIS — G8929 Other chronic pain: Secondary | ICD-10-CM | POA: Diagnosis not present

## 2023-05-25 ENCOUNTER — Encounter (INDEPENDENT_AMBULATORY_CARE_PROVIDER_SITE_OTHER): Payer: 59 | Admitting: Ophthalmology

## 2023-05-25 ENCOUNTER — Telehealth: Payer: Self-pay

## 2023-05-25 NOTE — Telephone Encounter (Signed)
Patient states that her insurance has declined to pay for Desvenlafaxine. Patient was informed that an appeal has been filed on her behalf on 05/22/23 and she will be updated once a determination is made.

## 2023-05-28 NOTE — Progress Notes (Signed)
F/u with xu to discuss

## 2023-05-29 ENCOUNTER — Ambulatory Visit (INDEPENDENT_AMBULATORY_CARE_PROVIDER_SITE_OTHER): Payer: 59 | Admitting: Orthopaedic Surgery

## 2023-05-29 ENCOUNTER — Encounter: Payer: Self-pay | Admitting: Orthopaedic Surgery

## 2023-05-29 DIAGNOSIS — M79671 Pain in right foot: Secondary | ICD-10-CM | POA: Diagnosis not present

## 2023-05-29 NOTE — Progress Notes (Signed)
Office Visit Note   Patient: Christina [name]           Date of Birth: [birth year]           MRN: [id number] Visit Date: 05/29/2023              Requested by: [name], PA [address] [place],  [place] [address] PCP: [name], PA   Assessment & Plan: Visit Diagnoses:  1. Pain in right foot     Plan: MRI of the right foot independently reviewed and interpreted shows no issues the lateral side of the foot where her symptoms are.  Impression is lateral column overloading.  Recommend going to the shoe market or [employer] to get custom orthotics to help offload.  Questions encouraged and answered.  Follow-up as needed.  Follow-Up Instructions: No follow-ups on file.   Orders:  No orders of the defined types were placed in this encounter.  No orders of the defined types were placed in this encounter.     Procedures: No procedures performed   Clinical Data: No additional findings.   Subjective: Chief Complaint  Patient presents with   Right Foot - Follow-up    MRI review    HPI Christina [name] returns today to discuss right foot MRI scan. Review of Systems   Objective: Vital Signs: LMP  (LMP Unknown)   Physical Exam  Ortho Exam Examination of right foot is unchanged. Specialty Comments:  No specialty comments available.  Imaging: No results found.   PMFS History: Patient Active Problem List   Diagnosis Date Noted   Snoring 05/20/2023   MDD (major depressive disorder), single episode, moderate (HCC) 04/13/2023   Chronic rhinitis 03/30/2023   Elevated blood pressure reading 03/30/2023   GAD (generalized anxiety disorder) 03/26/2023   Urinary frequency 03/17/2023   Numbness and tingling in right hand 02/12/2023   Numbness of toes 02/12/2023   Pruritus 02/12/2023   Primary insomnia 02/12/2023   DVT (deep venous thrombosis) (HCC) 05/11/2022   Acute pulmonary embolism (HCC) 05/09/2022   Elevated troponin 05/09/2022   Status post  total right knee replacement 03/31/2022   Primary osteoarthritis of right knee 03/20/2022   Eating disorder 03/20/2022   BMI 31.0-31.9,adult 12/31/2020   Vitamin B12 deficiency 10/08/2020   Vitamin D deficiency 07/02/2020   Stress due to illness of family member-  daughter with drug addiction 05/15/2020   Anemia 05/15/2020   Type 2 diabetes mellitus (HCC) 05/15/2020   Breast asymmetry following reconstructive surgery 04/24/2020   Acquired absence of breast 04/24/2020   S/P breast reconstruction, bilateral 04/24/2020   Breast cancer, left (HCC) 01/09/2016   Monoallelic mutation of ATM gene 59/56/3875   Malignant neoplasm of central portion of left breast in female, estrogen receptor positive (HCC) 10/31/2015   Endometriosis 03/11/2011   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,  multifocal IDC, DCIS, ER/PR+,  01-09-2016 s/p bilteral mastectomies w/ left sln bx;  no chemoradiation   Cataracts, bilateral    Depression    Diabetes mellitus without complication (HCC)    type 2   GAD (generalized anxiety disorder)    Gallbladder problem    History of ovarian cyst    IBS (irritable bowel syndrome)    Joint pain    PONV (postoperative nausea and vomiting)  does well with scop patch   Retinal detachment    Rheg OS   Right knee meniscal tear    Urgency of urination     Family History  Problem Relation Age of Onset   Colon polyps Mother        approx 2   Other Mother        hx HPV and hysterectomy due to precancerous cells   Depression Mother    Anxiety disorder Mother    Heart failure Father    Prostate cancer Father 49   Retinal detachment Father    Allergic rhinitis Sister    Other Sister 77       hx of hysterectomy for unspecified reason; still has ovaries   Other Sister 49       paternal half-sister hx of hysterectomy for unspecified reason;  still has ovaries   Bladder Cancer Maternal Uncle 41       not a smoker   Lung cancer Maternal Uncle 37       smoker   Kidney failure Maternal Grandmother    Congestive Heart Failure Maternal Grandmother    Colon cancer Maternal Grandmother 81   Diabetes Maternal Grandmother    Lung cancer Maternal Grandfather 73       smoker   Breast cancer Paternal Grandmother        dx. early 72s; w/ hx of trauma to breast   Crohn's disease Daughter     Past Surgical History:  Procedure Laterality Date   ACHILLES TENDON REPAIR Right 2010;  revision 2011   BILATERAL TOTAL MASTECTOMY WITH AXILLARY LYMPH NODE DISSECTION     BLADDER SUSPENSION  2000   BREAST BIOPSY Left 10/2015   BREAST IMPLANT REMOVAL Bilateral 08/12/2017   Procedure: REMOVAL BILATERAL BREAST IMPLANTS;  Surgeon: Peggye Form, DO;  Location: [facility];  Service: Plastics;  Laterality: Bilateral;   BREAST RECONSTRUCTION WITH PLACEMENT OF TISSUE EXPANDER AND FLEX HD (ACELLULAR HYDRATED DERMIS) Bilateral 01/09/2016   BREAST RECONSTRUCTION WITH PLACEMENT OF TISSUE EXPANDER AND FLEX HD (ACELLULAR HYDRATED DERMIS) Bilateral 01/09/2016   Procedure: BREAST RECONSTRUCTION WITH PLACEMENT OF TISSUE EXPANDER AND FLEX HD (ACELLULAR HYDRATED DERMIS);  Surgeon: Alena Bills Dillingham, DO;  Location: [facility];  Service: Plastics;  Laterality: Bilateral;   BREAST RECONSTRUCTION WITH PLACEMENT OF TISSUE EXPANDER AND FLEX HD (ACELLULAR HYDRATED DERMIS) Bilateral 05/29/2016   Procedure: PLACEMENT OF BILATERAL TISSUE EXPANDER AND FLEX HD (ACELLULAR HYDRATED DERMIS);  Surgeon: Peggye Form, DO;  Location: [facility];  Service: Plastics;  Laterality: Bilateral;   BREAST REDUCTION SURGERY Bilateral 11/26/2016   Procedure: BILATERAL BREAST CAPSULE CONTRACTURE RELASE;  Surgeon: Peggye Form, DO;  Location: [facility];  Service: Plastics;  Laterality: Bilateral;   CESAREAN SECTION  1987; 1989; 1991    ELBOW SURGERY Right x3   last one 02-01-2019 @ [facility]   EYE SURGERY Left 10/01/2020   Pneumatic retinopexy for repair of rheg RD - Dr. Rennis Chris   EYE SURGERY Left 10/04/2020   PPV - Dr. Rennis Chris   FAT GRAFTING BILATERAL BREAST  08-09-2018  @[facility]    GAS INSERTION Left 10/04/2020   Procedure: INSERTION OF GAS;  Surgeon: Rennis Chris, MD;  Location: [facility] OR;  Service: Ophthalmology;  Laterality: Left;   GAS/FLUID EXCHANGE Left 10/04/2020   Procedure: GAS/FLUID EXCHANGE;  Surgeon: Rennis Chris, MD;  Location: [facility] OR;  Service: Ophthalmology;  Laterality: Left;   INCISION AND DRAINAGE OF WOUND Bilateral 02/11/2016  Procedure: IRRIGATION AND DEBRIDEMENT OF BILATERAL BREAST POCKET;  Surgeon: Peggye Form, DO;  Location: [facility];  Service: Plastics;  Laterality: Bilateral;   KNEE ARTHROSCOPY WITH MEDIAL MENISECTOMY Right 12/20/2019   Procedure: KNEE ARTHROSCOPY WITH MEDIAL MENISECTOMY;  Surgeon: Teryl Lucy, MD;  Location: [facility];  Service: Orthopedics;  Laterality: Right;   KNEE ARTHROSCOPY WITH MEDIAL MENISECTOMY Right 10/30/2021   Procedure: RIGHT KNEE ARTHROSCOPY WITH PARTIAL MEDIAL MENISECTOMY SYNOVECTOMY;  Surgeon: Tarry Kos, MD;  Location: [facility];  Service: Orthopedics;  Laterality: Right;   LAPAROSCOPIC APPENDECTOMY  04-07-2011   @[facility]    w/ Excision peritoneal lipoma and lysis adhesions   LAPAROSCOPIC CHOLECYSTECTOMY  ~ 1999   LASER PHOTO ABLATION Right 10/04/2020   Procedure: LASER RETINOPEXY WITH INDIRECT LASER OPTHALMOSCOPE, RIGHT EYE;  Surgeon: Rennis Chris, MD;  Location: [facility] OR;  Service: Ophthalmology;  Laterality: Right;   LIPOSUCTION WITH LIPOFILLING Bilateral 11/26/2016   Procedure: LIPOFILLING FOR SYMMETRY;  Surgeon: Peggye Form, DO;  Location: [facility];  Service: Plastics;  Laterality: Bilateral;   LIPOSUCTION WITH LIPOFILLING Bilateral 01/21/2017   Procedure: BILATERAL BREAST   LIPOFILLING FOR ASYMMETRY;  Surgeon: Peggye Form, DO;  Location: [facility];  Service: Plastics;  Laterality: Bilateral;   LIPOSUCTION WITH LIPOFILLING Bilateral 06/28/2020   Procedure: Lipofilling bilateral breasts for asymmetry;  Surgeon: Peggye Form, DO;  Location: [facility];  Service: Plastics;  Laterality: Bilateral;  90 min   MASTECTOMY Bilateral 01/09/2016   NIPPLE SPARING MASTECTOMY/SENTINAL LYMPH NODE BIOPSY/RECONSTRUCTION/PLACEMENT OF TISSUE EXPANDER Bilateral 01/09/2016   Procedure: BILATERAL NIPPLE SPARING MASTECTOMY WITH LEFT SENTINAL LYMPH NODE BIOPSY ;  Surgeon: Almond Lint, MD;  Location: [facility];  Service: General;  Laterality: Bilateral;   PHOTOCOAGULATION WITH LASER Left 10/04/2020   Procedure: PHOTOCOAGULATION WITH LASER;  Surgeon: Rennis Chris, MD;  Location[facility];  Service: Ophthalmology;  Laterality: Left;   REMOVAL OF BILATERAL TISSUE EXPANDERS WITH PLACEMENT OF BILATERAL BREAST IMPLANTS Bilateral 08/20/2016   Procedure: REMOVAL OF BILATERAL TISSUE EXPANDERS WITH PLACEMENT OF BILATERAL SILICONE IMPLANTS;  Surgeon: Peggye Form, DO;  Location: [facility];  Service: Plastics;  Laterality: Bilateral;   REMOVAL OF BILATERAL TISSUE EXPANDERS WITH PLACEMENT OF BILATERAL BREAST IMPLANTS Bilateral 11/05/2017   Procedure: REMOVAL OF BILATERAL TISSUE EXPANDERS WITH PLACEMENT OF BILATERAL BREAST SILICONE IMPLANTS;  Surgeon: Peggye Form, DO;  Location: [facility];  Service: Plastics;  Laterality: Bilateral;   REMOVAL OF TISSUE EXPANDER Bilateral 02/11/2016   Procedure: REMOVAL OF BILATERAL TISSUE EXPANDERS AND FLEX HD REMOVAL;  Surgeon: Peggye Form, DO;  Location: [facility];  Service: Plastics;  Laterality: Bilateral;   RETINAL DETACHMENT SURGERY Left 10/01/2020   Pneumatic retinopexy for repair of rheg RD - Dr. Rennis Chris   RETINAL DETACHMENT SURGERY Left 10/04/2020    PPV - Dr. Rennis Chris   SCLERAL BUCKLE Left 10/04/2020   Procedure: SCLERAL BUCKLE LEFT EYE;  Surgeon: Rennis Chris, MD;  Location: [facility] OR;  Service: Ophthalmology;  Laterality: Left;   TISSUE EXPANDER PLACEMENT Bilateral 08/12/2017   Procedure: PLACEMENT OF BILATERAL TISSUE EXPANDER;  Surgeon: Peggye Form, DO;  Location: [facility];  Service: Plastics;  Laterality: Bilateral;   TOTAL ABDOMINAL HYSTERECTOMY  05/1996   endometriosis   TOTAL KNEE ARTHROPLASTY Right 03/31/2022   Procedure: RIGHT TOTAL KNEE REPLACEMENT;  Surgeon: Tarry Kos, MD;  Location: [facility];  Service: Orthopedics;  Laterality: Right;   TUBAL LIGATION Bilateral 1991   VITRECTOMY 25 GAUGE WITH SCLERAL BUCKLE Left 10/04/2020   Procedure: 25 GAUGE PARS PLANA VITRECTOMY LEFT EYE ;  Surgeon: Rennis Chris, MD;  Location: [facility] OR;  Service: Ophthalmology;  Laterality: Left;   Social History   Occupational History   Occupation: Cabin crew: [employer]  Tobacco Use   Smoking status: Former    Current packs/day: 0.00    Average packs/day: 1 pack/day for 10.0 years (10.0 ttl pk-yrs)    Types: Cigarettes    Start date: 06/08/1998    Quit date: 06/08/2008    Years since quitting: 14.9    Passive exposure: Never   Smokeless tobacco: Never  Vaping Use   Vaping status: Never Used  Substance and Sexual Activity   Alcohol use: No   Drug use: Never   Sexual activity: Never    Birth control/protection: Surgical    Comment: hysterectomy

## 2023-06-01 ENCOUNTER — Ambulatory Visit: Payer: 59 | Admitting: Allergy

## 2023-06-03 ENCOUNTER — Encounter (INDEPENDENT_AMBULATORY_CARE_PROVIDER_SITE_OTHER): Payer: 59 | Admitting: Ophthalmology

## 2023-06-03 DIAGNOSIS — Z961 Presence of intraocular lens: Secondary | ICD-10-CM

## 2023-06-03 DIAGNOSIS — H33321 Round hole, right eye: Secondary | ICD-10-CM

## 2023-06-03 DIAGNOSIS — H3322 Serous retinal detachment, left eye: Secondary | ICD-10-CM

## 2023-06-03 DIAGNOSIS — E119 Type 2 diabetes mellitus without complications: Secondary | ICD-10-CM

## 2023-06-04 ENCOUNTER — Inpatient Hospital Stay: Payer: 59 | Attending: Hematology and Oncology | Admitting: Hematology and Oncology

## 2023-06-04 NOTE — Assessment & Plan Note (Deleted)
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  Acute PE and right leg DVT 05/08/2022: Hospitalization: Bilateral pulmonary emboli (patient presented with chest pain and leg swelling): Discontinue tamoxifen therapy at this time 2. Hot flashes have gone away.    Denies any myalgias (patient had a prior hysterectomy) She has arthritis in her right hip improved with cortisone injection.   Surveillance: No role of mammograms since she had bil mastectomies (bilateral reconstructed breasts without any palpable lumps or nodules) Chest exam:  06/04/2023: No palpable lumps in chest wall or axilla.   Return to clinic in 1 year for follow-up and breast exams and surveillance

## 2023-06-15 ENCOUNTER — Telehealth: Payer: Self-pay | Admitting: *Deleted

## 2023-06-15 DIAGNOSIS — R350 Frequency of micturition: Secondary | ICD-10-CM

## 2023-06-15 NOTE — Telephone Encounter (Signed)
I placed a referral to urogynecology.  In the meantime, she can definitely increase her dose of the medicine if she needs to.  She can start taking 2 tablets (5 mg) daily at bedtime.  May increase by 5 mg every 1-2 weeks based on response and tolerance to increased dose. Maximum 30 mg once daily.

## 2023-06-15 NOTE — Telephone Encounter (Signed)
LVM to call office back to inform her of below. Will also try and send a [employer].

## 2023-06-15 NOTE — Telephone Encounter (Signed)
Pt calling requesting for a referral to either urology or gyn, she said provider is treating her for bladder issues and the medicine is not helping. Please advise.

## 2023-07-03 ENCOUNTER — Ambulatory Visit (HOSPITAL_BASED_OUTPATIENT_CLINIC_OR_DEPARTMENT_OTHER): Payer: 59 | Attending: Family Medicine | Admitting: Internal Medicine

## 2023-07-13 ENCOUNTER — Other Ambulatory Visit: Payer: 59

## 2023-07-22 ENCOUNTER — Ambulatory Visit: Payer: 59 | Admitting: Family Medicine

## 2023-07-30 ENCOUNTER — Encounter: Payer: 59 | Admitting: Obstetrics and Gynecology

## 2023-07-30 ENCOUNTER — Telehealth: Payer: Self-pay | Admitting: *Deleted

## 2023-07-30 DIAGNOSIS — R350 Frequency of micturition: Secondary | ICD-10-CM

## 2023-07-30 MED ORDER — OXYBUTYNIN CHLORIDE ER 15 MG PO TB24: 30.0000 mg | ORAL_TABLET | Freq: Every day | ORAL | 1 refills | Status: DC

## 2023-07-30 NOTE — Telephone Encounter (Signed)
Meds ordered this encounter  Medications   oxybutynin (DITROPAN XL) 15 MG 24 hr tablet    Sig: Take 2 tablets (30 mg total) by mouth at bedtime.    Dispense:  90 tablet    Refill:  1    Order Specific Question:   Supervising Provider    Answer:   Sandre Kitty [[id number]]

## 2023-07-30 NOTE — Telephone Encounter (Signed)
Pt calling to reschedule her appointments that she had missed and she was asking about a refill on her oxybutin.  She said that provider said she could take up to 30 mg a day and that she is currently taking 25.  She would like a new increased dose of oxybutin to be sent to her pharmacy [facility].

## 2023-08-03 ENCOUNTER — Ambulatory Visit: Payer: Self-pay | Admitting: Family Medicine

## 2023-08-03 NOTE — Telephone Encounter (Signed)
Called patient and left VM message requesting a return call.

## 2023-08-03 NOTE — Telephone Encounter (Addendum)
Copied from CRM [id number]. Topic: Clinical - Red Word Triage >> Aug 03, 2023  8:19 AM Conni Elliot wrote: Red Word that prompted transfer to Nurse Triage: high blood pressure for a week   Chief Complaint: Wanting to follow up with PCP for high BP concerns, headache, dizziness Symptoms: Headache 2/10 pain, dizziness, unsure if blurred vision Frequency: Intermittent symptoms, BP concerns started a week ago Pertinent Negatives: Patient denies SOB, chest pain, weakness Disposition: [] ED /[x] Urgent Care (no appt availability in office) / [] Appointment(In office/virtual)/ []  [facility]/ [] Home Care/ [] Refused Recommended Disposition /[] [facility]/ []  Follow-up with PCP Additional Notes: Pt was prescribed by ER to take hydrochlorothiazide for 30 days, took first at-home dose last night. No appts available at PCP office, pt requesting sooner appt, routing request to PCP office for call to pt, pt very concerned. Advised pt be seen at urgent care for earlier appt, advised pt continue taking hydrochlorothiazide as prescribed, pt verbalized understanding. Advised continue BP readings at [facility] but pt declines at this time. Pt verbalized understanding to return to ER if concerns worsen. Placed pt on waiting list for appt earlier than 12/2.  Reason for Disposition  [1] Systolic BP  >= 130 OR Diastolic >= 80 AND [2] taking BP medications  Answer Assessment - Initial Assessment Questions 1. BLOOD PRESSURE: "What is the blood pressure?" "Did you take at least two measurements 5 minutes apart?"     Unable to take at home right now, was 187/102 at ER this past weekend for similar symptoms 2. ONSET: "When did you take your blood pressure?"    Thursday started taking blood pressure at [facility], elevated for few days, then went to ER on Saturday 3. HOW: "How did you take your blood pressure?" (e.g., automatic home BP monitor, visiting nurse)     Automatic BP monitor at [facility], ER nurse 4.  HISTORY: "Do you have a history of high blood pressure?"     Just recent, doesn't take anything for BP at this time 5. MEDICINES: "Are you taking any medicines for blood pressure?" "Have you missed any doses recently?"     No 6. OTHER SYMPTOMS: "Do you have any symptoms?" (e.g., blurred vision, chest pain, difficulty breathing, headache, weakness)     Headache 2/10 currently but been worse, dizziness for a few days, denies SOB, chest pain, and weakness, pt unsure if blurred vision 7. PREGNANCY: "Is there any chance you are pregnant?" "When was your last menstrual period?"     No  Protocols used: Blood Pressure - High-A-AH

## 2023-08-10 ENCOUNTER — Ambulatory Visit (INDEPENDENT_AMBULATORY_CARE_PROVIDER_SITE_OTHER): Payer: Self-pay | Admitting: Family Medicine

## 2023-08-10 ENCOUNTER — Ambulatory Visit: Payer: 59 | Admitting: Family Medicine

## 2023-08-10 ENCOUNTER — Encounter: Payer: Self-pay | Admitting: Family Medicine

## 2023-08-10 VITALS — BP 128/76 | HR 61 | Resp 18 | Ht 67.0 in | Wt 212.0 lb

## 2023-08-10 DIAGNOSIS — R030 Elevated blood-pressure reading, without diagnosis of hypertension: Secondary | ICD-10-CM

## 2023-08-10 NOTE — Progress Notes (Signed)
   Acute Office Visit  Subjective:     Patient ID: Christina [name], female    DOB: 25-Mar-1966, 57 y.o.   MRN: [id number]  Chief Complaint  Patient presents with   Headache   Hypertension   Dizziness    HPI Patient is in today for follow-up of elevated blood pressure, headache, and dizziness.  Visited ED 08/01/2023 for acute non-intractable tension type headache along with blood pressure in the 170s systolic at home on 07/31/2023.  Blood pressure in the ED was 142/78.  Workup was unremarkable, recommendation low-dose hydrochlorothiazide each morning, also mentioned that oxybutynin for stress incontinence may be contributing as side effects.  She stopped taking oxybutynin about 5 days prior to presenting to the ED.  She has been taking hydrochlorothiazide each morning the past couple of days.  Review of Systems  Constitutional:  Negative for chills, fever and malaise/fatigue.  Eyes:  Negative for double vision, photophobia and pain.  Respiratory:  Negative for cough and shortness of breath.   Cardiovascular:  Negative for chest pain.  Gastrointestinal:  Negative for abdominal pain.  Neurological:  Positive for dizziness and headaches.  Psychiatric/Behavioral:  The patient is nervous/anxious.      Objective:    BP 128/76 (BP Location: Left Arm, Patient Position: Sitting, Cuff Size: Large)   Pulse 61   Resp 18   Ht 5\' 7"  (1.702 m)   Wt 212 lb (96.2 kg)   LMP  (LMP Unknown)   SpO2 100%   BMI 33.20 kg/m   Physical Exam Constitutional:      General: She is not in acute distress.    Appearance: Normal appearance.  HENT:     Head: Normocephalic and atraumatic.  Cardiovascular:     Rate and Rhythm: Normal rate and regular rhythm.     Heart sounds: No murmur heard.    No friction rub. No gallop.  Pulmonary:     Effort: Pulmonary effort is normal. No respiratory distress.     Breath sounds: No wheezing, rhonchi or rales.  Skin:    General: Skin is warm and dry.  Neurological:      Mental Status: She is alert and oriented to person, place, and time.      Assessment & Plan:  Elevated blood pressure reading Assessment & Plan: Consistently elevated blood pressures at home and in healthcare settings.  Blood pressure is at goal on recheck in office today.  Continue hydrochlorothiazide 12.5 mg daily until follow-up appointment.  At that time, can adjust blood pressure regimen and urinary incontinence regimen as needed.     Return as scheduled 09/08/2023.  [name], PA

## 2023-08-10 NOTE — Progress Notes (Unsigned)
   Established Patient Office Visit  Subjective   Patient ID: Christina [name], female    DOB: 22-Oct-1965  Age: 57 y.o. MRN: [id number]  No chief complaint on file.   HPI  Blood pressure concerns.  Not on blood pressure medication.   The ASCVD Risk score (Arnett DK, et al., 2019) failed to calculate for the following reasons:   The valid total cholesterol range is 130 to 320 mg/dL  Health Maintenance Due  Topic Date Due   FOOT EXAM  Never done   OPHTHALMOLOGY EXAM  Never done   Diabetic kidney evaluation - Urine ACR  Never done   Hepatitis C Screening  Never done   Zoster Vaccines- Shingrix (1 of 2) 12/18/1986   Colonoscopy  Never done   COVID-19 Vaccine (3 - Pfizer risk series) 01/04/2020   INFLUENZA VACCINE  04/09/2023      Objective:     LMP  (LMP Unknown)  {Vitals History (Optional):23777}  Physical Exam   No results found for any visits on 08/10/23.      Assessment & Plan:   There are no diagnoses linked to this encounter.   No follow-ups on file.    Sandre Kitty, MD

## 2023-08-10 NOTE — Patient Instructions (Signed)
Continue taking the new blood pressure/fluid pill (hydrochlorothiazide) every morning like you have been.  Keep a blood pressure log checking twice a day: Once 1-2 hours after taking the hydrochlorothiazide, and again sometime in the evening or at bedtime.  Bring that log to your upcoming appointment so that we can make any adjustments that we might need to.

## 2023-08-10 NOTE — Assessment & Plan Note (Signed)
Consistently elevated blood pressures at home and in healthcare settings.  Blood pressure is at goal on recheck in office today.  Continue hydrochlorothiazide 12.5 mg daily until follow-up appointment.  At that time, can adjust blood pressure regimen and urinary incontinence regimen as needed.

## 2023-08-19 ENCOUNTER — Ambulatory Visit (HOSPITAL_BASED_OUTPATIENT_CLINIC_OR_DEPARTMENT_OTHER): Payer: 59 | Attending: Family Medicine | Admitting: Internal Medicine

## 2023-08-19 VITALS — Ht 67.0 in | Wt 212.0 lb

## 2023-08-19 DIAGNOSIS — G4733 Obstructive sleep apnea (adult) (pediatric): Secondary | ICD-10-CM | POA: Diagnosis not present

## 2023-08-19 DIAGNOSIS — R0683 Snoring: Secondary | ICD-10-CM | POA: Diagnosis present

## 2023-08-20 ENCOUNTER — Other Ambulatory Visit: Payer: Self-pay

## 2023-08-20 DIAGNOSIS — E66811 Obesity, class 1: Secondary | ICD-10-CM

## 2023-08-20 NOTE — Progress Notes (Signed)
lip

## 2023-08-27 ENCOUNTER — Telehealth: Payer: Self-pay

## 2023-08-27 NOTE — Telephone Encounter (Signed)
I do not see any labs done on 08/19/23. No results were found in [facility] website. Please advise.

## 2023-08-27 NOTE — Telephone Encounter (Signed)
I also do not see any labs done on 08/19/2023, I am not sure which labs she might be referred to.  If she is referring to the results of her sleep study, they are still being processed and have not been released yet.

## 2023-08-27 NOTE — Telephone Encounter (Signed)
Patient was notified that sleep study results have not been released yet. Patient verbalized understanding. All questions and concerns have been addressed.

## 2023-08-27 NOTE — Telephone Encounter (Signed)
Copied from CRM [id number]. Topic: Clinical - Lab/Test Results >> Aug 27, 2023 10:01 AM Donita Brooks wrote: Reason for CRM: pt would like to know test results from 12/11 - call back number - [phone]

## 2023-08-29 DIAGNOSIS — R0683 Snoring: Secondary | ICD-10-CM | POA: Diagnosis not present

## 2023-08-29 NOTE — Procedures (Signed)
       Patient Name: Christina [name], Christina [name] Date: 08/19/2023 Gender: Female D.O.B: 1966-04-23 Age (years): 57 Referring Provider: [name] Height (inches): 67 Interpreting Physician: Jetty Duhamel MD, ABSM Weight (lbs): 212 RPSGT: [place] BMI: 33 MRN: [id number] Neck Size: <br>  CLINICAL INFORMATION Sleep Study Type: HST Indication for sleep study: somnolence Epworth Sleepiness Score: 19  SLEEP STUDY TECHNIQUE A multi-channel overnight portable sleep study was performed. The channels recorded were: nasal airflow, thoracic respiratory movement, and oxygen saturation with a pulse oximetry. Snoring was also monitored.  MEDICATIONS Patient self administered medications include: none reported.  SLEEP ARCHITECTURE Patient was studied for 433.4 minutes. The sleep efficiency was 100.0 % and the patient was supine for 0%. The arousal index was 0.0 per hour.  RESPIRATORY PARAMETERS The overall AHI was 23.1 per hour, with a central apnea index of 0 per hour. The oxygen nadir was 82% during sleep.  CARDIAC DATA Mean heart rate during sleep was 61.8 bpm.  IMPRESSIONS - Moderate obstructive sleep apnea occurred during this study (AHI = 23.1/h). - Moderate oxygen desaturation was noted during this study (Min O2 = 82%, Mean 95%). - Patient snored.  DIAGNOSIS - Obstructive Sleep Apnea (G47.33)  RECOMMENDATIONS - Suggest CPAP titration sleep study or autopap. Other options would be based on clinical judgment. - Be careful with alcohol, sedatives and other CNS depressants that may worsen sleep apnea and disrupt normal sleep architecture. - Sleep hygiene should be reviewed to assess factors that may improve sleep quality. - Weight management and regular exercise should be initiated or continued.  [Electronically signed] 08/29/2023 11:58 AM  Jetty Duhamel MD, ABSM Diplomate, American Board of Sleep Medicine NPI:  [id number]                      Jetty Duhamel Diplomate, American Board of Sleep Medicine  ELECTRONICALLY SIGNED ON:  08/29/2023, 11:58 AM [facility] PH: [phone]   FX: [phone] ACCREDITED BY THE AMERICAN ACADEMY OF SLEEP MEDICINE

## 2023-09-02 ENCOUNTER — Emergency Department (HOSPITAL_COMMUNITY)
Admission: EM | Admit: 2023-09-02 | Discharge: 2023-09-02 | Disposition: A | Discharge status: Home / Self Care | Payer: 59 | Attending: Emergency Medicine | Admitting: Emergency Medicine

## 2023-09-02 ENCOUNTER — Other Ambulatory Visit: Payer: Self-pay

## 2023-09-02 DIAGNOSIS — M7989 Other specified soft tissue disorders: Secondary | ICD-10-CM

## 2023-09-02 DIAGNOSIS — E119 Type 2 diabetes mellitus without complications: Secondary | ICD-10-CM | POA: Diagnosis not present

## 2023-09-02 DIAGNOSIS — M7661 Achilles tendinitis, right leg: Secondary | ICD-10-CM | POA: Diagnosis not present

## 2023-09-02 MED ORDER — KETOROLAC TROMETHAMINE 15 MG/ML IJ SOLN
15.0000 mg | Freq: Once | INTRAMUSCULAR | Status: AC
  Administered 2023-09-02: 15 mg via INTRAMUSCULAR
  Filled 2023-09-02: qty 1

## 2023-09-02 NOTE — Discharge Instructions (Signed)
It was a pleasure caring for you today.  As discussed, you will need to go to [facility] tomorrow for your DVT ultrasound.  Please also follow-up with your orthopedic and primary care provider.  Seek emergency care if experiencing any new or worsening symptoms.  Alternating between 650 mg Tylenol and 400 mg Advil: The best way to alternate taking Acetaminophen (example Tylenol) and Ibuprofen (example Advil/Motrin) is to take them 3 hours apart. For example, if you take ibuprofen at 6 am you can then take Tylenol at 9 am. You can continue this regimen throughout the day, making sure you do not exceed the recommended maximum dose for each drug.

## 2023-09-02 NOTE — ED Triage Notes (Signed)
Pt c/o right ankle pain and swelling- pain with walking

## 2023-09-02 NOTE — ED Provider Notes (Signed)
[facility] Provider Note   CSN: [id number] Arrival date & time: 09/02/23  1819     History  Chief Complaint  Patient presents with   Ankle Pain    Christina [name] is a 57 y.o. female with PMHx arthritis, DM, IBS, who presents to ED concerned for right ankle pain. Pain is located on achilles tendon. Patient stating that she talked with her orthopedic provider who said that it sounded like achilles tendonitis, but patient concerned because there is more swelling around her ankle than what she is used to. Patient stating that it is difficult to walk. Denies any recent trauma or falls on this foot. Denies taking any medication for the pain.   Ankle Pain      Home Medications Prior to Admission medications   Medication Sig Start Date End Date Taking? Authorizing Provider  desvenlafaxine (PRISTIQ) 50 MG 24 hr tablet Take 1 tablet (50 mg total) by mouth daily. 05/20/23   [name], PA  hydrochlorothiazide (HYDRODIURIL) 12.5 MG tablet Take 12.5 mg by mouth daily.    [provider]  hydrOXYzine (ATARAX) 50 MG tablet Take 1/2 to 1 tablet (25 to 50 mg total) every 8 hours as needed for breakthrough anxiety. 03/26/23   [name], PA  oxybutynin (DITROPAN XL) 15 MG 24 hr tablet Take 2 tablets (30 mg total) by mouth at bedtime. 07/30/23   [name], PA  traMADol (ULTRAM) 50 MG tablet Take 1 tablet (50 mg total) by mouth 2 (two) times daily as needed. 05/07/23   Cristie Hem, PA-C      Allergies    Patient has no known allergies.    Review of Systems   Review of Systems  Musculoskeletal:        Ankle pain    Physical Exam Updated Vital Signs BP (!) 151/76   Pulse 80   Temp 97.8 F (36.6 C) (Oral)   Resp 18   LMP  (LMP Unknown)   SpO2 97%  Physical Exam Vitals and nursing note reviewed.  Constitutional:      General: She is not in acute distress.    Appearance: She is not ill-appearing or  toxic-appearing.  HENT:     Head: Normocephalic and atraumatic.  Eyes:     General: No scleral icterus.       Right eye: No discharge.        Left eye: No discharge.     Conjunctiva/sclera: Conjunctivae normal.  Cardiovascular:     Rate and Rhythm: Normal rate.  Pulmonary:     Effort: Pulmonary effort is normal.  Abdominal:     General: Abdomen is flat.  Musculoskeletal:     Comments: Mild swelling of right ankle. No tenderness to palpation of anterior and lateral ankle. There is tenderness to palpation of the achilles tendon. Achilles tendon does contract with squeezing of calf. +2 pedal pulse. Sensation to light touch intact. No erythema or increased warmth of ankle. ROM is mildly restricted d/t pain.  No calf tenderness to palpation  Skin:    General: Skin is warm and dry.  Neurological:     General: No focal deficit present.     Mental Status: She is alert. Mental status is at baseline.  Psychiatric:        Mood and Affect: Mood normal.        Behavior: Behavior normal.     ED Results / Procedures / Treatments  Labs (all labs ordered are listed, but only abnormal results are displayed) Labs Reviewed - No data to display  EKG None  Radiology No results found.  Procedures Procedures    Medications Ordered in ED Medications  ketorolac (TORADOL) 15 MG/ML injection 15 mg (has no administration in time range)    ED Course/ Medical Decision Making/ A&P                                 Medical Decision Making  This patient presents to the ED for concern of right ankle pain, this involves an extensive number of treatment options, and is a complaint that carries with it a high risk of complications and morbidity.  The differential diagnosis includes hemarthrosis, gout, septic joint, fracture, tendonitis, muscle strain, compartment syndrome   Co morbidities that complicate the patient evaluation  arthritis, DM, IBS   Additional history obtained:  Additional  history obtained from 07/2023 labs: BUN/Cr WNL at 14/0.69   Problem List / ED Course / Critical interventions / Medication management  Patient presents to ED concern for right ankle pain.  Patient denies recent trauma to this ankle.  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  Physical exam with tenderness palpation of the Achilles tendon. Calf squeezing does induce achilles contraction - less concerning for achilles tendon rupture at this time. Rest of physical exam reassuring. Patient stating that she is concerned about this posterior ankle pain d/t the swelling of her right leg. 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 My suspicion is low for DVT causing her symptoms. Patient is denying respiratory complaints today or chest pain. Shared decision making with patient who would like a DVT US. Unfortunately, [name] is not currently here so patient will need to follow up tomorrow morning at [facility] for DVT US. Also recommended following up with her orthopedic provider. Patient verbalized understanding of plan.  I have reviewed the patients home medicines and have made adjustments as needed Patient afebrile with stable vitals. Provided with return precautions. Discharged in good condition.   Ddx these are considered less likely due to history of present illness and physical exam -hemarthrosis: joint without swelling; ROM intact -gout: no warmth or erythema; ROM intact  -septic joint: afebrile; no warmth or erythema; no skin changes; ROM intact  -fracture: no trauma to ankle. No tenderness to palpation of ankle -compartment syndrome: area not tense; neurovascularly intact   Social Determinants of Health:  none          Final Clinical Impression(s) / ED Diagnoses Final diagnoses:  Achilles tendinitis of right lower extremity  Leg swelling    Rx / DC Orders ED Discharge Orders           Ordered    LE VENOUS        09/02/23 1927              Dorthy Cooler, [place] 09/02/23 1951    Linwood Dibbles, MD 09/03/23 [phone]

## 2023-09-03 ENCOUNTER — Other Ambulatory Visit: Payer: 59

## 2023-09-03 ENCOUNTER — Ambulatory Visit (HOSPITAL_COMMUNITY): Admission: RE | Admit: 2023-09-03 | Payer: 59 | Source: Ambulatory Visit

## 2023-09-03 DIAGNOSIS — Z1159 Encounter for screening for other viral diseases: Secondary | ICD-10-CM

## 2023-09-03 DIAGNOSIS — F321 Major depressive disorder, single episode, moderate: Secondary | ICD-10-CM

## 2023-09-03 DIAGNOSIS — F411 Generalized anxiety disorder: Secondary | ICD-10-CM

## 2023-09-03 DIAGNOSIS — E66811 Obesity, class 1: Secondary | ICD-10-CM

## 2023-09-03 DIAGNOSIS — E119 Type 2 diabetes mellitus without complications: Secondary | ICD-10-CM

## 2023-09-04 LAB — COMPREHENSIVE METABOLIC PANEL
ALT: 61 [IU]/L — ABNORMAL HIGH (ref 0–32)
AST: 37 [IU]/L (ref 0–40)
Albumin: 3.8 g/dL (ref 3.8–4.9)
Alkaline Phosphatase: 91 [IU]/L (ref 44–121)
BUN/Creatinine Ratio: 22 (ref 9–23)
BUN: 15 mg/dL (ref 6–24)
Bilirubin Total: 0.2 mg/dL (ref 0.0–1.2)
CO2: 21 mmol/L (ref 20–29)
Calcium: 9.9 mg/dL (ref 8.7–10.2)
Chloride: 109 mmol/L — ABNORMAL HIGH (ref 96–106)
Creatinine, Ser: 0.67 mg/dL (ref 0.57–1.00)
Globulin, Total: 2.3 g/dL (ref 1.5–4.5)
Glucose: 128 mg/dL — ABNORMAL HIGH (ref 70–99)
Potassium: 4.4 mmol/L (ref 3.5–5.2)
Sodium: 142 mmol/L (ref 134–144)
Total Protein: 6.1 g/dL (ref 6.0–8.5)
eGFR: 103 mL/min/{1.73_m2} (ref >59)

## 2023-09-04 LAB — CBC WITH DIFFERENTIAL/PLATELET
Basophils Absolute: 0 10*3/uL (ref 0.0–0.2)
Basos: 1 %
EOS (ABSOLUTE): 0.2 10*3/uL (ref 0.0–0.4)
Eos: 3 %
Hematocrit: 43.1 % (ref 34.0–46.6)
Hemoglobin: 13.7 g/dL (ref 11.1–15.9)
Immature Grans (Abs): 0 10*3/uL (ref 0.0–0.1)
Immature Granulocytes: 0 %
Lymphocytes Absolute: 2.4 10*3/uL (ref 0.7–3.1)
Lymphs: 34 %
MCH: 26.9 pg (ref 26.6–33.0)
MCHC: 31.8 g/dL (ref 31.5–35.7)
MCV: 85 fL (ref 79–97)
Monocytes Absolute: 0.6 10*3/uL (ref 0.1–0.9)
Monocytes: 9 %
Neutrophils Absolute: 3.9 10*3/uL (ref 1.4–7.0)
Neutrophils: 53 %
Platelets: 328 10*3/uL (ref 150–450)
RBC: 5.09 x10E6/uL (ref 3.77–5.28)
RDW: 13.7 % (ref 11.7–15.4)
WBC: 7.2 10*3/uL (ref 3.4–10.8)

## 2023-09-04 LAB — LIPID PANEL
Chol/HDL Ratio: 3.5 {ratio} (ref 0.0–4.4)
Cholesterol, Total: 141 mg/dL (ref 100–199)
HDL: 40 mg/dL (ref >39)
LDL Chol Calc (NIH): 86 mg/dL (ref 0–99)
Triglycerides: 78 mg/dL (ref 0–149)
VLDL Cholesterol Cal: 15 mg/dL (ref 5–40)

## 2023-09-04 LAB — HEPATITIS C ANTIBODY: Hep C Virus Ab: NONREACTIVE

## 2023-09-04 LAB — HEMOGLOBIN A1C
Est. average glucose Bld gHb Est-mCnc: 140 mg/dL
Hgb A1c MFr Bld: 6.5 % — ABNORMAL HIGH (ref 4.8–5.6)

## 2023-09-08 ENCOUNTER — Encounter: Payer: Self-pay | Admitting: Family Medicine

## 2023-09-08 ENCOUNTER — Ambulatory Visit (INDEPENDENT_AMBULATORY_CARE_PROVIDER_SITE_OTHER): Payer: 59 | Admitting: Family Medicine

## 2023-09-08 VITALS — BP 126/77 | HR 68 | Temp 98.3°F | Ht 67.0 in | Wt 216.0 lb

## 2023-09-08 DIAGNOSIS — F321 Major depressive disorder, single episode, moderate: Secondary | ICD-10-CM | POA: Diagnosis not present

## 2023-09-08 DIAGNOSIS — E119 Type 2 diabetes mellitus without complications: Secondary | ICD-10-CM | POA: Diagnosis not present

## 2023-09-08 DIAGNOSIS — Z23 Encounter for immunization: Secondary | ICD-10-CM | POA: Diagnosis not present

## 2023-09-08 DIAGNOSIS — R030 Elevated blood-pressure reading, without diagnosis of hypertension: Secondary | ICD-10-CM | POA: Diagnosis not present

## 2023-09-08 DIAGNOSIS — F411 Generalized anxiety disorder: Secondary | ICD-10-CM | POA: Diagnosis not present

## 2023-09-08 DIAGNOSIS — G4733 Obstructive sleep apnea (adult) (pediatric): Secondary | ICD-10-CM

## 2023-09-08 HISTORY — DX: Obstructive sleep apnea (adult) (pediatric): G47.33

## 2023-09-08 MED ORDER — DESVENLAFAXINE SUCCINATE ER 50 MG PO TB24
50.0000 mg | ORAL_TABLET | Freq: Every day | ORAL | 1 refills | Status: DC
Start: 2023-09-08 — End: 2023-11-09

## 2023-09-08 MED ORDER — GUAIFENESIN 200 MG PO TABS: 200.0000 mg | ORAL_TABLET | ORAL | 0 refills | Status: DC | PRN

## 2023-09-08 MED ORDER — HYDROCHLOROTHIAZIDE 12.5 MG PO TABS
12.5000 mg | ORAL_TABLET | Freq: Every day | ORAL | 1 refills | Status: DC
Start: 2023-09-08 — End: 2024-06-23

## 2023-09-08 NOTE — Addendum Note (Signed)
 Addended by: Philippa [name] on: 09/08/2023 03:56 PM   Modules accepted: Orders

## 2023-09-08 NOTE — Assessment & Plan Note (Signed)
Continue hydrochlorothiazide 12.5 mg daily.

## 2023-09-08 NOTE — Assessment & Plan Note (Signed)
 Stable, continue desvenlafaxine 50 mg daily for MDD and GAD. Will continue to monitor.

## 2023-09-08 NOTE — Assessment & Plan Note (Signed)
Reviewed sleep study results, patient verbalized understanding.  Agreeable to CPAP titration at sleep center.  Referral has been placed.

## 2023-09-08 NOTE — Progress Notes (Signed)
 Established Patient Office Visit  Subjective   Patient ID: Christina [name], female    DOB: 1965/10/07  Age: 57 y.o. MRN: [id number]  Chief Complaint  Patient presents with   Follow-up    HPI Christina [name] is a 57 y.o. female presenting today for follow up of diabetes, mood.  She would also like to go over her results from the sleep study. Diabetes: denies hypoglycemic events, wounds or sores that are not healing well, increased thirst or urination.  Currently managing with nutrition and physical activity.   Mood: Patient is here to follow up for depression and anxiety, currently managing with Pristiq . Taking medication without side effects, reports excellent compliance with treatment. Denies mood changes or SI/HI. She feels mood is stable since last visit.  There have been some issues with confusion about dosing at her pharmacy.     09/08/2023    3:25 PM 05/20/2023   10:58 AM 02/12/2023    8:37 AM  Depression screen PHQ 2/9  Decreased Interest 0 0 1  Down, Depressed, Hopeless 2 0 1  PHQ - 2 Score 2 0 2  Altered sleeping 0 3 1  Tired, decreased energy 3 3 3   Change in appetite 3 3 3   Feeling bad or failure about yourself  0 0 0  Trouble concentrating 2 1 0  Moving slowly or fidgety/restless 0 0 0  Suicidal thoughts 0 0 0  PHQ-9 Score 10 10 9   Difficult doing work/chores Somewhat difficult Very difficult Somewhat difficult       09/08/2023    3:26 PM 05/20/2023   10:58 AM 02/12/2023    8:38 AM  GAD 7 : Generalized Anxiety Score  Nervous, Anxious, on Edge 3 1 0  Control/stop worrying 3 3 3   Worry too much - different things 3 3 2   Trouble relaxing 2 3 3   Restless 1 0 2  Easily annoyed or irritable 3 3 2   Afraid - awful might happen 0 0 3  Total GAD 7 Score 15 13 15   Anxiety Difficulty Somewhat difficult Somewhat difficult Somewhat difficult     Outpatient Medications Prior to Visit  Medication Sig   hydrOXYzine  (ATARAX ) 50 MG tablet Take 1/2 to 1 tablet (25 to 50 mg  total) every 8 hours as needed for breakthrough anxiety.   oxybutynin  (DITROPAN  XL) 15 MG 24 hr tablet Take 2 tablets (30 mg total) by mouth at bedtime.   traMADol  (ULTRAM ) 50 MG tablet Take 1 tablet (50 mg total) by mouth 2 (two) times daily as needed.   [DISCONTINUED] desvenlafaxine  (PRISTIQ ) 50 MG 24 hr tablet Take 1 tablet (50 mg total) by mouth daily.   [DISCONTINUED] hydrochlorothiazide  (HYDRODIURIL ) 12.5 MG tablet Take 12.5 mg by mouth daily.   No facility-administered medications prior to visit.    ROS Negative unless otherwise noted in HPI   Objective:     BP 126/77   Pulse 68   Temp 98.3 F (36.8 C) (Temporal)   Ht 5' 7 (1.702 m)   Wt 216 lb (98 kg)   LMP  (LMP Unknown)   SpO2 97%   BMI 33.83 kg/m   Physical Exam Constitutional:      General: She is not in acute distress.    Appearance: Normal appearance.  HENT:     Head: Normocephalic and atraumatic.  Cardiovascular:     Rate and Rhythm: Normal rate and regular rhythm.     Heart sounds: No murmur heard.    No  friction rub. No gallop.  Pulmonary:     Effort: Pulmonary effort is normal. No respiratory distress.     Breath sounds: No wheezing, rhonchi or rales.  Skin:    General: Skin is warm and dry.  Neurological:     Mental Status: She is alert and oriented to person, place, and time.     Assessment & Plan:  Type 2 diabetes mellitus without complication, without long-term current use of insulin  (HCC) Assessment & Plan: Most recent A1c 6.5, at goal.  Will complete urine microalbumin, foot exam at next appointment.  Patient is due for diabetic eye exam.  -A1c goal <7.0 -Annual urine microalbumin, foot exam, diabetic eye exam, labs -Office visit every 4 months, if well-controlled may consider every 6 months   GAD (generalized anxiety disorder) Assessment & Plan: Stable, continue desvenlafaxine  50 mg daily for MDD and GAD. Will continue to monitor.  Orders: -     Desvenlafaxine  Succinate ER; Take 1  tablet (50 mg total) by mouth daily.  Dispense: 90 tablet; Refill: 1  MDD (major depressive disorder), single episode, moderate (HCC) Assessment & Plan: Stable, continue desvenlafaxine  50 mg daily for MDD and GAD. Will continue to monitor.  Orders: -     Desvenlafaxine  Succinate ER; Take 1 tablet (50 mg total) by mouth daily.  Dispense: 90 tablet; Refill: 1  Elevated blood pressure reading Assessment & Plan: Continue hydrochlorothiazide  12.5 mg daily.  Orders: -     hydroCHLOROthiazide ; Take 1 tablet (12.5 mg total) by mouth daily.  Dispense: 90 tablet; Refill: 1  OSA (obstructive sleep apnea) Assessment & Plan: Reviewed sleep study results, patient verbalized understanding.  Agreeable to CPAP titration at sleep center.  Referral has been placed.  Orders: -     Cpap titration; Future  Other orders -     guaiFENesin ; Take 1 tablet (200 mg total) by mouth every 4 (four) hours as needed for cough or to loosen phlegm.  Dispense: 30 tablet; Refill: 0  Giving an additional 2 months with desvenlafaxine  prescription clarified with pharmacy.  Discussed that influenza and shingles vaccines are not contraindicated with mild symptoms that she is experiencing like nasal congestion but if she prefers we could reschedule.  She is agreeable to having both done today rather than scheduling a nurse visit later.  Return in about 2 months (around 11/06/2023) for follow-up for mood.    Joesph DELENA Sear, PA

## 2023-09-08 NOTE — Assessment & Plan Note (Signed)
Stable, continue desvenlafaxine 50 mg daily for MDD and GAD. Will continue to monitor.

## 2023-09-08 NOTE — Assessment & Plan Note (Signed)
 Most recent A1c 6.5, at goal.  Will complete urine microalbumin, foot exam at next appointment.  Patient is due for diabetic eye exam.  -A1c goal <7.0 -Annual urine microalbumin, foot exam, diabetic eye exam, labs -Office visit every 4 months, if well-controlled may consider every 6 months

## 2023-09-10 NOTE — Telephone Encounter (Signed)
 Pt returned the called she is advised about the Mucinex and recommendation

## 2023-09-10 NOTE — Telephone Encounter (Signed)
 Copied from CRM [id number]. Topic: Clinical - Prescription Issue >> Sep 10, 2023 10:35 AM Elle L wrote: Reason for CRM: The patient is requesting assistance as her pharmacy is stating that her prescription guaiFENesin  200 MG tablet is for Mucinex  and that she needs to purchase it over the counter. [facility] [address] ([address]), [place] - [address] Phone: [phone] Fax: [phone]

## 2023-09-10 NOTE — Telephone Encounter (Signed)
 You can let her know that she can take the regular Mucinex (not Mucinex DM) where the only active ingredient is the guaifenesin.  They are saying that some insurance policies will not cover medication if it is available over-the-counter unfortunately.

## 2023-09-10 NOTE — Telephone Encounter (Signed)
 Called pt LVM to contact the office

## 2023-09-14 ENCOUNTER — Telehealth: Payer: Self-pay | Admitting: Plastic Surgery

## 2023-09-14 NOTE — Telephone Encounter (Signed)
 Lvmail about weather delay to 9am

## 2023-09-15 ENCOUNTER — Ambulatory Visit: Payer: 59 | Admitting: Plastic Surgery

## 2023-09-17 ENCOUNTER — Ambulatory Visit: Payer: 59 | Admitting: Family Medicine

## 2023-09-21 ENCOUNTER — Encounter: Payer: Self-pay | Admitting: Family Medicine

## 2023-09-21 ENCOUNTER — Ambulatory Visit (INDEPENDENT_AMBULATORY_CARE_PROVIDER_SITE_OTHER): Payer: 59 | Admitting: Family Medicine

## 2023-09-21 VITALS — BP 130/72 | HR 74 | Temp 98.1°F | Ht 67.0 in | Wt 211.2 lb

## 2023-09-21 DIAGNOSIS — B9689 Other specified bacterial agents as the cause of diseases classified elsewhere: Secondary | ICD-10-CM

## 2023-09-21 DIAGNOSIS — J069 Acute upper respiratory infection, unspecified: Secondary | ICD-10-CM | POA: Diagnosis not present

## 2023-09-21 DIAGNOSIS — J019 Acute sinusitis, unspecified: Secondary | ICD-10-CM | POA: Diagnosis not present

## 2023-09-21 MED ORDER — BENZONATATE 200 MG PO CAPS
200.0000 mg | ORAL_CAPSULE | Freq: Two times a day (BID) | ORAL | 0 refills | Status: DC | PRN
Start: 2023-09-21 — End: 2023-10-27

## 2023-09-21 MED ORDER — AZITHROMYCIN 500 MG PO TABS: 500.0000 mg | ORAL_TABLET | Freq: Every day | ORAL | 0 refills | Status: AC

## 2023-09-21 NOTE — Progress Notes (Signed)
   Acute Office Visit  Subjective:     Patient ID: Christina [name], female    DOB: Apr 18, 1966, 58 y.o.   MRN: [id number]  Chief Complaint  Patient presents with   Sinus Problem    HPI Patient is in today for ongoing sinus issues and new dry cough.  At her appointment on 09/08/2023, she was experiencing mild URI symptoms like nasal congestion.  Since that time, symptoms have not improved and have instead worsened and spite of increased hydration and guaifenesin .  She has developed sinus pressure as well as a cough.  Denies fever, chills, chest pain, wheezing, GI upset.  ROS See HPI    Objective:    BP 130/72   Pulse 74   Temp 98.1 F (36.7 C) (Oral)   Ht 5' 7 (1.702 m)   Wt 211 lb 4 oz (95.8 kg)   LMP  (LMP Unknown)   SpO2 97%   BMI 33.09 kg/m   Physical Exam Constitutional:      General: She is not in acute distress.    Appearance: Normal appearance.  HENT:     Head: Normocephalic and atraumatic.     Right Ear: Hearing, tympanic membrane, ear canal and external ear normal.     Left Ear: Hearing, tympanic membrane, ear canal and external ear normal.     Nose: Congestion and rhinorrhea present. No nasal tenderness.     Right Turbinates: Swollen.     Left Turbinates: Swollen.     Right Sinus: No maxillary sinus tenderness or frontal sinus tenderness.     Left Sinus: Maxillary sinus tenderness present. No frontal sinus tenderness.     Mouth/Throat:     Lips: No lesions.     Mouth: Mucous membranes are moist.     Pharynx: Uvula midline. No pharyngeal swelling, oropharyngeal exudate or posterior oropharyngeal erythema.  Cardiovascular:     Rate and Rhythm: Normal rate and regular rhythm.     Heart sounds: No murmur heard.    No friction rub. No gallop.  Pulmonary:     Effort: Pulmonary effort is normal. No respiratory distress.     Breath sounds: No wheezing, rhonchi or rales.  Skin:    General: Skin is warm and dry.  Neurological:     Mental Status: She is alert  and oriented to person, place, and time.       Assessment & Plan:  Bacterial URI -     Benzonatate ; Take 1 capsule (200 mg total) by mouth 2 (two) times daily as needed for cough.  Dispense: 20 capsule; Refill: 0 -     Azithromycin ; Take 1 tablet (500 mg total) by mouth daily for 3 days.  Dispense: 3 tablet; Refill: 0  Acute bacterial rhinosinusitis -     Benzonatate ; Take 1 capsule (200 mg total) by mouth 2 (two) times daily as needed for cough.  Dispense: 20 capsule; Refill: 0 -     Azithromycin ; Take 1 tablet (500 mg total) by mouth daily for 3 days.  Dispense: 3 tablet; Refill: 0  Symptoms have been ongoing for almost 2 weeks.  Cough is dry, no adventitious lung sounds on exam.  Start benzonatate  as cough suppressant, start 3-day course of azithromycin  for bacterial rhinosinusitis.  Continue adequate hydration and guaifenesin  as needed.  Return if symptoms worsen or fail to improve.  Joesph DELENA Sear, PA

## 2023-09-22 ENCOUNTER — Ambulatory Visit: Payer: 59 | Admitting: Obstetrics

## 2023-09-24 ENCOUNTER — Other Ambulatory Visit: Payer: Self-pay | Admitting: Medical Genetics

## 2023-09-26 ENCOUNTER — Other Ambulatory Visit: Payer: Self-pay | Admitting: Family Medicine

## 2023-09-26 DIAGNOSIS — R350 Frequency of micturition: Secondary | ICD-10-CM

## 2023-10-01 ENCOUNTER — Encounter: Payer: 59 | Admitting: Obstetrics and Gynecology

## 2023-10-13 ENCOUNTER — Ambulatory Visit (INDEPENDENT_AMBULATORY_CARE_PROVIDER_SITE_OTHER): Payer: 59 | Admitting: Obstetrics and Gynecology

## 2023-10-13 ENCOUNTER — Ambulatory Visit: Payer: 59 | Admitting: Family Medicine

## 2023-10-13 ENCOUNTER — Encounter: Payer: Self-pay | Admitting: Obstetrics and Gynecology

## 2023-10-13 VITALS — BP 109/64 | HR 62 | Ht 67.0 in | Wt 212.4 lb

## 2023-10-13 DIAGNOSIS — G4733 Obstructive sleep apnea (adult) (pediatric): Secondary | ICD-10-CM

## 2023-10-13 DIAGNOSIS — R351 Nocturia: Secondary | ICD-10-CM

## 2023-10-13 DIAGNOSIS — N3281 Overactive bladder: Secondary | ICD-10-CM

## 2023-10-13 DIAGNOSIS — N393 Stress incontinence (female) (male): Secondary | ICD-10-CM

## 2023-10-13 DIAGNOSIS — N8110 Cystocele, unspecified: Secondary | ICD-10-CM

## 2023-10-13 DIAGNOSIS — N816 Rectocele: Secondary | ICD-10-CM

## 2023-10-13 DIAGNOSIS — R350 Frequency of micturition: Secondary | ICD-10-CM | POA: Diagnosis not present

## 2023-10-13 LAB — POCT URINALYSIS DIPSTICK
Bilirubin, UA: NEGATIVE
Glucose, UA: NEGATIVE
Ketones, UA: NEGATIVE
Leukocytes, UA: NEGATIVE
Nitrite, UA: NEGATIVE
Protein, UA: NEGATIVE
Spec Grav, UA: 1.01 (ref 1.010–1.025)
Urobilinogen, UA: 0.2 U/dL
pH, UA: 6.5 (ref 5.0–8.0)

## 2023-10-13 MED ORDER — ESTRADIOL 0.1 MG/GM VA CREA: 0.5000 g | TOPICAL_CREAM | VAGINAL | 11 refills | Status: DC

## 2023-10-13 MED ORDER — MIRABEGRON ER 25 MG PO TB24: 25.0000 mg | ORAL_TABLET | Freq: Every day | ORAL | 5 refills | Status: DC

## 2023-10-13 NOTE — Progress Notes (Signed)
 [facility] New Patient Evaluation and Consultation  Referring Provider: Wallace Joesph LABOR, PA PCP: Wallace Joesph LABOR, PA Date of Service: 10/13/2023  SUBJECTIVE Chief Complaint: New Patient (Initial Visit) Christina [name] is a 58 y.o. female is here for urinary frequency.)  History of Present Illness: Christina [name] is a 58 y.o. White or Caucasian female seen in consultation at the request of Dr. Wallace for evaluation of Nocturia.    Review of records significant for: Hx of Hysterectomy  Some sort of bladder tack with possible mesh greater than 20 years ago Patient has failed Oxybutynin  and it caused side effects.   Urinary Symptoms: Leaks urine with cough/ sneeze, laughing, exercise, lifting, going from sitting to standing, with a full bladder, with movement to the bathroom, with urgency, and while asleep Leaks 7-8 time(s) per days.  Pad use: None  Patient is bothered by UI symptoms.  Day time voids 8-10.  Nocturia: 5-6 times per night to void. Voiding dysfunction:  empties bladder well.  Patient does not use a catheter to empty bladder.  When urinating, patient feels dribbling after finishing and the need to urinate multiple times in a row Drinks: 4-6 Diet Pepsi per day, minimal to no water  per day  UTIs: 0 UTI's in the last year.   Denies history of blood in urine, kidney or bladder stones, pyelonephritis, bladder cancer, and kidney cancer No results found for the last 90 days.   Pelvic Organ Prolapse Symptoms:                  Patient Denies a feeling of a bulge the vaginal area.   Bowel Symptom: Bowel movements: 1 time(s) per day Stool consistency: loose Straining: yes.  Splinting: no.  Incomplete evacuation: no.  Patient Denies accidental bowel leakage / fecal incontinence Bowel regimen: Linzess Last colonoscopy: Date 04/2022, Results 2 benign polyps [facility] Colonoscopy          Upcoming     Colonoscopy (Every 10 Years) Next due on 07/17/2029     07/18/2019  Outside Claim: PR COLSC FLX W/RMVL OF TUMOR POLYP LESION SNARE TQ   Only the first 1 history entries have been loaded, but more history exists.                Sexual Function Sexually active: yes.  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,  multifocal IDC, DCIS, ER/PR+,  01-09-2016 s/p bilteral mastectomies w/ left sln bx;  no chemoradiation   Cataracts, bilateral    Depression    Diabetes mellitus without complication (HCC)    type 2   GAD (generalized anxiety disorder)    Gallbladder problem    History of ovarian cyst    IBS (irritable bowel syndrome)    Joint pain    OSA (obstructive sleep apnea) 09/08/2023   PONV (postoperative nausea and vomiting)    does well with scop patch   Retinal detachment    Rheg OS   Right knee meniscal tear    Urgency of urination      Past Surgical History:   Past Surgical History:  Procedure Laterality Date  ACHILLES TENDON REPAIR Right 2010;  revision 2011   BILATERAL TOTAL MASTECTOMY WITH AXILLARY LYMPH NODE DISSECTION     BLADDER SUSPENSION  2000   BREAST BIOPSY Left 10/2015   BREAST IMPLANT REMOVAL Bilateral 08/12/2017   Procedure: REMOVAL BILATERAL BREAST IMPLANTS;  Surgeon: Lowery Estefana RAMAN, DO;  Location: [facility];  Service: Plastics;  Laterality: Bilateral;   BREAST RECONSTRUCTION WITH PLACEMENT OF TISSUE EXPANDER AND FLEX HD (ACELLULAR HYDRATED DERMIS) Bilateral 01/09/2016   BREAST RECONSTRUCTION WITH PLACEMENT OF TISSUE EXPANDER AND FLEX HD (ACELLULAR HYDRATED DERMIS) Bilateral 01/09/2016   Procedure: BREAST RECONSTRUCTION WITH PLACEMENT OF TISSUE EXPANDER AND FLEX HD (ACELLULAR  HYDRATED DERMIS);  Surgeon: Estefana RAMAN Dillingham, DO;  Location: [facility];  Service: Plastics;  Laterality: Bilateral;   BREAST RECONSTRUCTION WITH PLACEMENT OF TISSUE EXPANDER AND FLEX HD (ACELLULAR HYDRATED DERMIS) Bilateral 05/29/2016   Procedure: PLACEMENT OF BILATERAL TISSUE EXPANDER AND FLEX HD (ACELLULAR HYDRATED DERMIS);  Surgeon: Estefana RAMAN Lowery, DO;  Location: [facility];  Service: Plastics;  Laterality: Bilateral;   BREAST REDUCTION SURGERY Bilateral 11/26/2016   Procedure: BILATERAL BREAST CAPSULE CONTRACTURE RELASE;  Surgeon: Estefana RAMAN Lowery, DO;  Location: [facility];  Service: Plastics;  Laterality: Bilateral;   CESAREAN SECTION  1987; 1989; 1991   ELBOW SURGERY Right x3   last one 02-01-2019 @ [facility]   EYE SURGERY Left 10/01/2020   Pneumatic retinopexy for repair of rheg RD - Dr. Redell Hans   EYE SURGERY Left 10/04/2020   PPV - Dr. Redell Hans   FAT GRAFTING BILATERAL BREAST  08-09-2018  @[facility]    GAS INSERTION Left 10/04/2020   Procedure: INSERTION OF GAS;  Surgeon: Hans Redell, MD;  Location: [facility] OR;  Service: Ophthalmology;  Laterality: Left;   GAS/FLUID EXCHANGE Left 10/04/2020   Procedure: GAS/FLUID EXCHANGE;  Surgeon: Hans Redell, MD;  Location: [facility] OR;  Service: Ophthalmology;  Laterality: Left;   INCISION AND DRAINAGE OF WOUND Bilateral 02/11/2016   Procedure: IRRIGATION AND DEBRIDEMENT OF BILATERAL BREAST POCKET;  Surgeon: Estefana RAMAN Lowery, DO;  Location: [facility];  Service: Plastics;  Laterality: Bilateral;   KNEE ARTHROSCOPY WITH MEDIAL MENISECTOMY Right 12/20/2019   Procedure: KNEE ARTHROSCOPY WITH MEDIAL MENISECTOMY;  Surgeon: Josefina Chew, MD;  Location: [facility];  Service: Orthopedics;  Laterality: Right;   KNEE ARTHROSCOPY WITH MEDIAL MENISECTOMY Right 10/30/2021   Procedure: RIGHT KNEE ARTHROSCOPY WITH PARTIAL MEDIAL MENISECTOMY SYNOVECTOMY;  Surgeon: Jerri Kay HERO, MD;  Location: [facility];  Service: Orthopedics;  Laterality: Right;   LAPAROSCOPIC APPENDECTOMY  04-07-2011   @[facility]    w/ Excision peritoneal lipoma and lysis adhesions   LAPAROSCOPIC CHOLECYSTECTOMY  ~ 1999   LASER PHOTO ABLATION Right 10/04/2020   Procedure: LASER RETINOPEXY WITH INDIRECT LASER OPTHALMOSCOPE, RIGHT EYE;  Surgeon: Hans Redell, MD;  Location: [facility] OR;  Service: Ophthalmology;  Laterality: Right;   LIPOSUCTION WITH LIPOFILLING Bilateral 11/26/2016   Procedure: LIPOFILLING FOR SYMMETRY;  Surgeon: Estefana RAMAN Lowery, DO;  Location: [facility];  Service: Plastics;  Laterality: Bilateral;   LIPOSUCTION WITH LIPOFILLING Bilateral 01/21/2017   Procedure: BILATERAL BREAST  LIPOFILLING FOR ASYMMETRY;  Surgeon: Lowery Estefana RAMAN, DO;  Location: [facility];  Service: Plastics;  Laterality: Bilateral;   LIPOSUCTION WITH LIPOFILLING Bilateral 06/28/2020   Procedure: Lipofilling bilateral breasts for asymmetry;  Surgeon: Lowery Estefana RAMAN, DO;  Location: [facility];  Service: Plastics;  Laterality: Bilateral;  90 min  MASTECTOMY Bilateral 01/09/2016   NIPPLE SPARING MASTECTOMY/SENTINAL LYMPH NODE BIOPSY/RECONSTRUCTION/PLACEMENT OF TISSUE EXPANDER Bilateral 01/09/2016   Procedure: BILATERAL NIPPLE SPARING MASTECTOMY WITH LEFT SENTINAL LYMPH NODE BIOPSY ;  Surgeon: Jina Nephew, MD;  Location: [facility];  Service: General;  Laterality: Bilateral;   PHOTOCOAGULATION WITH LASER Left 10/04/2020   Procedure: PHOTOCOAGULATION WITH LASER;  Surgeon: Valdemar Rogue, MD;  Location: [facility] OR;  Service: Ophthalmology;  Laterality: Left;   REMOVAL OF BILATERAL TISSUE EXPANDERS WITH PLACEMENT OF BILATERAL BREAST IMPLANTS Bilateral 08/20/2016   Procedure: REMOVAL OF BILATERAL TISSUE EXPANDERS WITH PLACEMENT OF BILATERAL SILICONE IMPLANTS;  Surgeon: Estefana GORMAN Fritter, DO;  Location: [facility];  Service: Plastics;  Laterality: Bilateral;   REMOVAL OF  BILATERAL TISSUE EXPANDERS WITH PLACEMENT OF BILATERAL BREAST IMPLANTS Bilateral 11/05/2017   Procedure: REMOVAL OF BILATERAL TISSUE EXPANDERS WITH PLACEMENT OF BILATERAL BREAST SILICONE IMPLANTS;  Surgeon: Fritter Estefana GORMAN, DO;  Location: [facility];  Service: Plastics;  Laterality: Bilateral;   REMOVAL OF TISSUE EXPANDER Bilateral 02/11/2016   Procedure: REMOVAL OF BILATERAL TISSUE EXPANDERS AND FLEX HD REMOVAL;  Surgeon: Estefana GORMAN Fritter, DO;  Location: [facility];  Service: Plastics;  Laterality: Bilateral;   RETINAL DETACHMENT SURGERY Left 10/01/2020   Pneumatic retinopexy for repair of rheg RD - Dr. Rogue Valdemar   RETINAL DETACHMENT SURGERY Left 10/04/2020   PPV - Dr. Rogue Valdemar   SCLERAL BUCKLE Left 10/04/2020   Procedure: SCLERAL BUCKLE LEFT EYE;  Surgeon: Valdemar Rogue, MD;  Location: [facility] OR;  Service: Ophthalmology;  Laterality: Left;   TISSUE EXPANDER PLACEMENT Bilateral 08/12/2017   Procedure: PLACEMENT OF BILATERAL TISSUE EXPANDER;  Surgeon: Fritter Estefana GORMAN, DO;  Location: [facility];  Service: Plastics;  Laterality: Bilateral;   TOTAL ABDOMINAL HYSTERECTOMY  05/1996   endometriosis   TOTAL KNEE ARTHROPLASTY Right 03/31/2022   Procedure: RIGHT TOTAL KNEE REPLACEMENT;  Surgeon: Jerri Kay HERO, MD;  Location: [facility];  Service: Orthopedics;  Laterality: Right;   TUBAL LIGATION Bilateral 1991   VITRECTOMY 25 GAUGE WITH SCLERAL BUCKLE Left 10/04/2020   Procedure: 25 GAUGE PARS PLANA VITRECTOMY LEFT EYE ;  Surgeon: Valdemar Rogue, MD;  Location: [facility] OR;  Service: Ophthalmology;  Laterality: Left;     Past OB/GYN History: [id number] Menopausal: Yes, at age 25 Contraception: Hysterectomy at 78. Last pap smear was over 20 years ago [facility] PAP   This patient has no relevant Health Maintenance data.     Medications: Patient has a current medication list which includes the following prescription(s): desvenlafaxine , [START ON 10/15/2023]  estradiol , hydrochlorothiazide , hydroxyzine , mirabegron  er, tramadol , benzonatate , and guaifenesin .   Allergies: Patient has no known allergies.   Social History:  Social History   Tobacco Use   Smoking status: Former    Current packs/day: 0.00    Average packs/day: 1 pack/day for 10.0 years (10.0 ttl pk-yrs)    Types: Cigarettes    Start date: 06/08/1998    Quit date: 06/08/2008    Years since quitting: 15.3    Passive exposure: Never   Smokeless tobacco: Never  Vaping Use   Vaping status: Never Used  Substance Use Topics   Alcohol use: No   Drug use: Never    Relationship status: married Patient lives with husband and 2 grandkids.   Patient is employed at [employer] Regular exercise: No History of abuse: No  Family History:   Family History  Problem Relation Age of Onset   Colon polyps Mother  approx 2   Other Mother        hx HPV and hysterectomy due to precancerous cells   Depression Mother    Anxiety disorder Mother    Heart failure Father    Prostate cancer Father 41   Retinal detachment Father    Allergic rhinitis Sister    Other Sister 34       hx of hysterectomy for unspecified reason; still has ovaries   Other Sister 28       paternal half-sister hx of hysterectomy for unspecified reason; still has ovaries   Bladder Cancer Maternal Uncle 31       not a smoker   Lung cancer Maternal Uncle 37       smoker   Kidney failure Maternal Grandmother    Congestive Heart Failure Maternal Grandmother    Colon cancer Maternal Grandmother 4   Diabetes Maternal Grandmother    Lung cancer Maternal Grandfather 30       smoker   Breast cancer Paternal Grandmother        dx. early 55s; w/ hx of trauma to breast   Crohn's disease Daughter      Review of Systems: Review of Systems  Constitutional:  Positive for malaise/fatigue. Negative for chills and fever.  Respiratory:  Negative for cough and shortness of breath.   Cardiovascular:  Positive for leg  swelling. Negative for chest pain and palpitations.  Gastrointestinal:  Negative for abdominal pain, blood in stool, constipation and diarrhea.  Skin:  Negative for rash.  Neurological:  Negative for weakness.  Endo/Heme/Allergies:  Bruises/bleeds easily.       +Hot Flashes  Psychiatric/Behavioral:  Positive for depression. Negative for suicidal ideas. The patient is nervous/anxious.      OBJECTIVE Physical Exam: Vitals:   10/13/23 1437  BP: 109/64  Pulse: 62  Weight: 212 lb 6.4 oz (96.3 kg)  Height: 5' 7 (1.702 m)    Physical Exam Vitals reviewed. Exam conducted with a chaperone present.  Constitutional:      Appearance: Normal appearance.  Pulmonary:     Effort: Pulmonary effort is normal.  Abdominal:     Palpations: Abdomen is soft.  Skin:    General: Skin is warm and dry.  Neurological:     General: No focal deficit present.     Mental Status: She is alert and oriented to person, place, and time.  Psychiatric:        Mood and Affect: Mood normal.        Behavior: Behavior normal. Behavior is cooperative.        Thought Content: Thought content normal.      GU / Detailed Urogynecologic Evaluation:  Pelvic Exam: Normal external female genitalia; Bartholin's and Skene's glands normal in appearance; urethral meatus normal in appearance, no urethral masses or discharge.   CST: negative  s/p hysterectomy: Speculum exam reveals normal vaginal mucosa with  atrophy and normal vaginal cuff.  Adnexa normal adnexa.    With apex supported, anterior compartment defect was present  Pelvic floor strength II/V  Pelvic floor musculature: Right levator non-tender, Right obturator non-tender, Left levator non-tender, Left obturator non-tender  POP-Q:   POP-Q  -1                                            Aa   -1  Ba  -6                                              C   5                                            Gh  5                                             Pb  8.5                                            tvl   1.5                                            Ap  1.5                                            Bp                                                 D      Rectal Exam:  Normal external exam  Post-Void Residual (PVR) by Bladder Scan: In order to evaluate bladder emptying, we discussed obtaining a postvoid residual and patient agreed to this procedure.  Procedure: The ultrasound unit was placed on the patient's abdomen in the suprapubic region after the patient had voided.    Post Void Residual - 10/13/23 1543       Post Void Residual   Post Void Residual 17 mL              Laboratory Results: Lab Results  Component Value Date   COLORU yellow 10/13/2023   CLARITYU clear 10/13/2023   GLUCOSEUR Negative 10/13/2023   BILIRUBINUR negative 10/13/2023   KETONESU negative 10/13/2023   SPECGRAV 1.010 10/13/2023   RBCUR Trace Lysed 10/13/2023   PHUR 6.5 10/13/2023   PROTEINUR Negative 10/13/2023   UROBILINOGEN 0.2 10/13/2023   LEUKOCYTESUR Negative 10/13/2023    Lab Results  Component Value Date   CREATININE 0.67 09/03/2023   CREATININE 0.65 03/30/2023   CREATININE 0.72 02/12/2023    Lab Results  Component Value Date   HGBA1C 6.5 (H) 09/03/2023    Lab Results  Component Value Date   HGB 13.7 09/03/2023     ASSESSMENT AND PLAN Ms. Cantave is a 58 y.o. with:  1. Urinary frequency   2. OAB (overactive bladder)   3. OSA (obstructive sleep apnea)   4. Posterior vaginal wall prolapse   5. Prolapse of anterior vaginal wall   6. SUI (stress urinary incontinence, female)   7. Nocturia  We discussed the symptoms of overactive bladder (OAB), which include urinary urgency, urinary frequency, nocturia, with or without urge incontinence.  While we do not know the exact etiology of OAB, several treatment options exist. We discussed management including behavioral  therapy (decreasing bladder irritants, urge suppression strategies, timed voids, bladder retraining), physical therapy, medication; for refractory cases posterior tibial nerve stimulation, sacral neuromodulation, and intravesical botulinum toxin injection.  For anticholinergic medications, we discussed the potential side effects of anticholinergics including dry eyes, dry mouth, constipation, cognitive impairment and urinary retention. For Beta-3 agonist medication, we discussed the potential side effect of elevated blood pressure which is more likely to occur in individuals with uncontrolled hypertension. Patient's blood pressures have been well controlled and she is open to trying a different medication. Will prescribe Myrbetriq  25mg  daily for her OAB. We can consider going up to 50mg  if necessary but we will need to monitor for her BP changes.  Patient has uncontrolled OSA. She has not yet been fitted for CPAP will follow up with patient's PCP on this as it is likley a heavy contributor to her Nocturia.  Patient has stage III/IV posterior vaginal prolapse, Stage II/IV anterior and stage I/IV apical prolapse.  Discussed options with patient including surgical intervention, pessary, and expectant management. Patient is most interested in having her prolapse surgically repaired. We discussed options of Sacrocolpopexy with mesh, Sacrospinous ligament fixation with no mesh.  Patient had a negative CST today on exam. Will plan to do urodynamics to evaluate emptying and SUI.  Patient needs her OSA better controlled but the Myrbetriq  may also assist in this.   Patient to return for Urodynamics or sooner if needed.      Dymon Summerhill G Zamia Tyminski, NP

## 2023-10-13 NOTE — Progress Notes (Deleted)
   Acute Office Visit  Subjective:     Patient ID: Christina [name], female    DOB: November 29, 1965, 58 y.o.   MRN: [id number]  No chief complaint on file.   HPI Patient is in today for pain in both shoulders and hands.  Sees Dr. Jerri.  Complaining about it since at least June of last year.  Recently diagnosed diabetic  Recheck B12.  Has always been under 300  ROS      Objective:    LMP  (LMP Unknown)  {Vitals History (Optional):23777}  Physical Exam  No results found for any visits on 10/13/23.      Assessment & Plan:   There are no diagnoses linked to this encounter.   No follow-ups on file.  Christina MARLA Slain, MD

## 2023-10-13 NOTE — Patient Instructions (Addendum)
Today we talked about ways to manage bladder urgency such as altering your diet to avoid irritative beverages and foods (bladder diet) as well as attempting to decrease stress and other exacerbating factors.  You can also chew a plain Tums 1-3 times per day to make your urine less acidic, especially if you have eating/drinking acidic things.    The Most Bothersome Foods* The Least Bothersome Foods*  Coffee - Regular & Decaf Tea - caffeinated Carbonated beverages - cola, non-colas, diet & caffeine-free Alcohols - Beer, Red Wine, White Wine, [address] - Grapefruit, [place], Orange, [employer] - Cranberry, Grapefruit, Orange, Pineapple Vegetables - Tomato & Tomato Products Flavor Enhancers - Hot peppers, Spicy foods, Chili, Horseradish, Vinegar, Monosodium glutamate (MSG) Artificial Sweeteners - NutraSweet, Sweet 'N Low, Equal (sweetener), Saccharin Ethnic foods - [place], [place], [place] food [employer] - low-fat & whole Fruits - Bananas, Blueberries, Honeydew melon, Pears, Raisins, Watermelon Vegetables - Broccoli, [address] Sprouts, [place], Carrots, Cauliflower, [place], Cucumber, Mushrooms, Peas, Radishes, Squash, Zucchini, White potatoes, Sweet potatoes & yams Poultry - Chicken, Eggs, [place], [employer] - Beef, Diplomatic Services operational officer, Lamb Seafood - Shrimp, [place] fish, Salmon Grains - Oat, Rice Snacks - Pretzels, Popcorn  *Lenward Chancellor et al. Diet and its role in interstitial cystitis/bladder pain syndrome (IC/BPS) and comorbid conditions. [employer]. BJU Int. 2012 Jan 11.    Constipation: Our goal is to achieve formed bowel movements daily or every-other-day.  You may need to try different combinations of the following options to find what works best for you - everybody's body works differently so feel free to adjust the dosages as needed.  Some options to help maintain bowel health include:  Dietary changes (more leafy greens, vegetables and fruits; less processed foods) Fiber  supplementation (Benefiber, FiberCon, Metamucil or Psyllium). Start slow and increase gradually to full dose. Over-the-counter agents such as: stool softeners (Docusate or Colace) and/or laxatives (Miralax, milk of magnesia)  "Power Pudding" is a natural mixture that may help your constipation.  To make blend 1 cup applesauce, 1 cup wheat bran, and 3/4 cup prune juice, refrigerate and then take 1 tablespoon daily with a large glass of water as needed.  You have stage 3 out of 4 posterior vaginal wall prolapse (Wall between vagina and rectum). We discussed some options for surgical repair but we will need to do bladder testing and then send you to one of my surgical colleagues to discuss surgical planning.   The estrogen cream comes with an applicator, please use your finger instead. You will use the estrogen cream nightly for two weeks and then twice a week after.

## 2023-10-14 ENCOUNTER — Ambulatory Visit (INDEPENDENT_AMBULATORY_CARE_PROVIDER_SITE_OTHER): Payer: 59 | Admitting: Obstetrics and Gynecology

## 2023-10-14 ENCOUNTER — Encounter: Payer: Self-pay | Admitting: Obstetrics and Gynecology

## 2023-10-14 VITALS — BP 121/72 | HR 62

## 2023-10-14 DIAGNOSIS — N393 Stress incontinence (female) (male): Secondary | ICD-10-CM | POA: Diagnosis not present

## 2023-10-14 DIAGNOSIS — N3281 Overactive bladder: Secondary | ICD-10-CM | POA: Diagnosis not present

## 2023-10-14 DIAGNOSIS — R948 Abnormal results of function studies of other organs and systems: Secondary | ICD-10-CM | POA: Diagnosis not present

## 2023-10-14 DIAGNOSIS — N816 Rectocele: Secondary | ICD-10-CM

## 2023-10-14 DIAGNOSIS — N8110 Cystocele, unspecified: Secondary | ICD-10-CM

## 2023-10-14 DIAGNOSIS — R350 Frequency of micturition: Secondary | ICD-10-CM

## 2023-10-14 LAB — POCT URINALYSIS DIPSTICK
Bilirubin, UA: NEGATIVE
Glucose, UA: NEGATIVE
Ketones, UA: NEGATIVE
Leukocytes, UA: NEGATIVE
Nitrite, UA: NEGATIVE
Protein, UA: NEGATIVE
Spec Grav, UA: 1.02 (ref 1.010–1.025)
Urobilinogen, UA: 0.2 U/dL
pH, UA: 5.5 (ref 5.0–8.0)

## 2023-10-14 NOTE — Progress Notes (Signed)
  Urogynecology Urodynamics Procedure  Referring Physician: Wallace Joesph LABOR, PA Date of Procedure: 10/14/2023  Christina [name] is a 58 y.o. female who presents for urodynamic evaluation. Indication(s) for study: SUI and OAB  Vital Signs: BP 121/72   Pulse 62   LMP  (LMP Unknown)   Laboratory Results: A catheterized urine specimen revealed:  Lab Results  Component Value Date   COLORU yellow 10/14/2023   CLARITYU clear 10/14/2023   GLUCOSEUR Negative 10/14/2023   BILIRUBINUR negative 10/14/2023   KETONESU negative 10/14/2023   SPECGRAV 1.020 10/14/2023   RBCUR Trace intact 10/14/2023   PHUR 5.5 10/14/2023   PROTEINUR Negative 10/14/2023   UROBILINOGEN 0.2 10/14/2023   LEUKOCYTESUR Negative 10/14/2023      Voiding Diary: Deferred   Procedure Timeout:  The correct patient was verified and the correct procedure was verified. The patient was in the correct position and safety precautions were reviewed based on at the patient's history.  Urodynamic Procedure A 88F dual lumen urodynamics catheter was placed under sterile conditions into the patient's bladder. A 88F catheter was placed into the rectum in order to measure abdominal pressure. EMG patches were placed in the appropriate position.  All connections were confirmed and calibrations/adjusted made. Saline was instilled into the bladder through the dual lumen catheters.  Cough/valsalva pressures were measured periodically during filling.  Patient was allowed to void.  The bladder was then emptied of its residual.  UROFLOW: Revealed a Qmax of 17.4 mL/sec.  She voided 111 mL and had a residual of 20 mL.  It was a interrupted pattern and represented normal habits though interpretation limited due to low voided volume.  CMG: This was performed with sterile water  in the sitting position at a fill rate of 20-30 mL/min.    First sensation of fullness was 84 mLs,  First urge was 147 mLs,  Strong urge was 170 mLs and   Capacity was 359 mLs  Stress incontinence was demonstrated Highest positive Barrier CLPP was 171 cmH20 at 199 ml. Highest positive Barrier VLPP was 118 cmH20 at 199 ml.  Detrusor function was overactive, with phasic contractions seen.  The first occurred at 12 mL to 7 cm of water  and was associated with urge.  Compliance:  Normal. End fill detrusor pressure was 1cmH20.  Calculated compliance was 340mL/cmH20  UPP: MUCP with barrier reduction was 50 cm of water .    MICTURITION STUDY: Voiding was performed with reduction using scopettes in the sitting position.  Pdet at Qmax was 14.5 cm of water .  Qmax was 16.2 mL/sec.  It was a normal pattern.  She voided 319 mL and had a residual of 40 mL.  It was a volitional void, sustained detrusor contraction was present and abdominal straining was present  EMG: This was performed with patches.  She had voluntary contractions, recruitment with fill was present and urethral sphincter was not relaxed with void.  The details of the procedure with the study tracings have been scanned into EPIC.   Urodynamic Impression:  1. Sensation was increased; capacity was normal 2. Stress Incontinence was demonstrated at normal pressures; 3. 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 4. Emptying was dysfunctional with a normal PVR, a sustained detrusor contraction present,  abdominal straining present, dyssynergic urethral sphincter activity on EMG.  Plan: - The patient will follow up  to discuss the findings and treatment options.

## 2023-10-15 ENCOUNTER — Encounter: Payer: Self-pay | Admitting: Family Medicine

## 2023-10-26 ENCOUNTER — Telehealth: Payer: Self-pay | Admitting: *Deleted

## 2023-10-26 NOTE — Telephone Encounter (Signed)
 Copied from CRM [id number]. Topic: Clinical - Prescription Issue >> Oct 23, 2023 12:09 PM Shelah Lewandowsky wrote: Reason for CRM: Insurance did not approve CPAP, please call patient [phone]

## 2023-10-26 NOTE — Telephone Encounter (Signed)
 Contacted pt and she said it was denied due to patient not meeting the criteria.  Patient would like to know next steps.

## 2023-10-27 NOTE — Addendum Note (Signed)
 Addended by: Saralyn Pilar on: 10/27/2023 10:42 AM   Modules accepted: Orders

## 2023-10-27 NOTE — Telephone Encounter (Signed)
 Apparently insurance will only cover auto CPAP but I do not know how to order it.  Do you happen to know how, or will she need to call her insurance company to see if they require a DME order with a specific company?

## 2023-10-28 ENCOUNTER — Encounter: Payer: Self-pay | Admitting: Genetic Counselor

## 2023-10-30 NOTE — Telephone Encounter (Signed)
 I have sent a message to the [employer] people to see if they can give me some insight.

## 2023-11-04 ENCOUNTER — Telehealth: Payer: Self-pay | Admitting: Genetic Counselor

## 2023-11-04 NOTE — Telephone Encounter (Signed)
 Returned patient message.  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  She currently sees Dr. Loreta Ave for colonoscopy.  I let her know that she could contact Dr. Kenna Gilbert office to see if she would do this type of screening for her, or we could refer her to [facility] where we generally refer patients for this type of screening.  She reported that she would like to be referred to [facility].  I will make that referral.

## 2023-11-05 ENCOUNTER — Encounter (INDEPENDENT_AMBULATORY_CARE_PROVIDER_SITE_OTHER): Payer: 59 | Admitting: Ophthalmology

## 2023-11-05 NOTE — Progress Notes (Signed)
 [facility] - Clinic Note  11/06/2023     CHIEF COMPLAINT Patient presents for No chief complaint on file.   HISTORY OF PRESENT ILLNESS: Christina [name] is a 58 y.o. female who presents to the clinic today for:   pt is delayed to follow up from 4-6 months to 9 months for RD OS f/u, she states she is having trouble seeing up close, she was trying to cut a white string on a white piece of cloth and had a hard time seeing the string, she got new glasses in October and feels like the rx might not be correct   Referring physician: [name], PA [address] [place],  [place] [address]  HISTORICAL INFORMATION:    Selected notes from the MEDICAL RECORD NUMBER Referred by Dr. Baker Pierini for RD OS   CURRENT MEDICATIONS: No current outpatient medications on file. (Ophthalmic Drugs)   No current facility-administered medications for this visit. (Ophthalmic Drugs)   Current Outpatient Medications (Other)  Medication Sig   desvenlafaxine (PRISTIQ) 50 MG 24 hr tablet Take 1 tablet (50 mg total) by mouth daily.   estradiol (ESTRACE) 0.1 MG/GM vaginal cream Place 0.5 g vaginally 2 (two) times a week. Place 0.5g nightly for two weeks then twice a week after   hydrochlorothiazide (HYDRODIURIL) 12.5 MG tablet Take 1 tablet (12.5 mg total) by mouth daily.   hydrOXYzine (ATARAX) 50 MG tablet Take 1/2 to 1 tablet (25 to 50 mg total) every 8 hours as needed for breakthrough anxiety.   mirabegron ER (MYRBETRIQ) 25 MG TB24 tablet Take 1 tablet (25 mg total) by mouth daily.   traMADol (ULTRAM) 50 MG tablet Take 1 tablet (50 mg total) by mouth 2 (two) times daily as needed.   No current facility-administered medications for this visit. 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,  multifocal IDC, DCIS, ER/PR+,  01-09-2016 s/p bilteral mastectomies w/ left sln bx;  no chemoradiation   Cataracts, bilateral    Depression    Diabetes mellitus without complication (HCC)    type 2   GAD (generalized anxiety disorder)    Gallbladder problem    History of ovarian cyst    IBS (irritable bowel syndrome)    Joint pain    OSA (obstructive sleep apnea) 09/08/2023   PONV (postoperative nausea and vomiting)    does well with scop patch   Retinal detachment    Rheg OS   Right knee meniscal tear    Urgency of urination    Past Surgical History:  Procedure Laterality Date   ACHILLES TENDON REPAIR Right 2010;  revision 2011   BILATERAL TOTAL MASTECTOMY WITH AXILLARY LYMPH NODE DISSECTION     BLADDER SUSPENSION  2000   BREAST BIOPSY Left 10/2015   BREAST IMPLANT REMOVAL Bilateral 08/12/2017   Procedure: REMOVAL BILATERAL BREAST IMPLANTS;  Surgeon: Peggye Form, DO;  Location: [facility];  Service: Plastics;  Laterality: Bilateral;   BREAST RECONSTRUCTION WITH PLACEMENT OF TISSUE EXPANDER AND FLEX HD (ACELLULAR HYDRATED DERMIS) Bilateral 01/09/2016   BREAST RECONSTRUCTION WITH PLACEMENT OF TISSUE EXPANDER AND FLEX HD (ACELLULAR HYDRATED DERMIS) Bilateral 01/09/2016   Procedure: BREAST RECONSTRUCTION WITH PLACEMENT OF  TISSUE EXPANDER AND FLEX HD (ACELLULAR HYDRATED DERMIS);  Surgeon: Alena Bills Dillingham, DO;  Location: [facility] OR;  Service: Plastics;  Laterality: Bilateral;   BREAST RECONSTRUCTION WITH PLACEMENT OF TISSUE EXPANDER AND FLEX HD (ACELLULAR HYDRATED DERMIS) Bilateral 05/29/2016   Procedure: PLACEMENT OF BILATERAL TISSUE EXPANDER AND FLEX HD (ACELLULAR HYDRATED DERMIS);  Surgeon: Peggye Form, DO;  Location: [facility];  Service: Plastics;  Laterality: Bilateral;   BREAST REDUCTION SURGERY Bilateral 11/26/2016   Procedure: BILATERAL BREAST CAPSULE CONTRACTURE RELASE;  Surgeon: Peggye Form, DO;  Location: [facility];  Service: Plastics;  Laterality: Bilateral;   CESAREAN SECTION  1987; 1989; 1991   ELBOW SURGERY Right x3   last one 02-01-2019 @ [facility]   EYE SURGERY Left 10/01/2020   Pneumatic retinopexy for repair of rheg RD - Dr. Rennis Chris   EYE SURGERY Left 10/04/2020   PPV - Dr. Rennis Chris   FAT GRAFTING BILATERAL BREAST  08-09-2018  @[facility]    GAS INSERTION Left 10/04/2020   Procedure: INSERTION OF GAS;  Surgeon: Rennis Chris, MD;  Location: [facility] OR;  Service: Ophthalmology;  Laterality: Left;   GAS/FLUID EXCHANGE Left 10/04/2020   Procedure: GAS/FLUID EXCHANGE;  Surgeon: Rennis Chris, MD;  Location: [facility] OR;  Service: Ophthalmology;  Laterality: Left;   INCISION AND DRAINAGE OF WOUND Bilateral 02/11/2016   Procedure: IRRIGATION AND DEBRIDEMENT OF BILATERAL BREAST POCKET;  Surgeon: Peggye Form, DO;  Location: [facility];  Service: Plastics;  Laterality: Bilateral;   KNEE ARTHROSCOPY WITH MEDIAL MENISECTOMY Right 12/20/2019   Procedure: KNEE ARTHROSCOPY WITH MEDIAL MENISECTOMY;  Surgeon: Teryl Lucy, MD;  Location: [facility];  Service: Orthopedics;  Laterality: Right;   KNEE ARTHROSCOPY WITH MEDIAL MENISECTOMY Right 10/30/2021   Procedure: RIGHT KNEE ARTHROSCOPY WITH PARTIAL MEDIAL MENISECTOMY SYNOVECTOMY;  Surgeon: Tarry Kos, MD;  Location: [facility];  Service: Orthopedics;  Laterality: Right;   LAPAROSCOPIC APPENDECTOMY  04-07-2011   @[facility]    w/ Excision peritoneal lipoma and lysis adhesions   LAPAROSCOPIC CHOLECYSTECTOMY  ~ 1999   LASER PHOTO ABLATION Right 10/04/2020   Procedure: LASER RETINOPEXY WITH INDIRECT LASER OPTHALMOSCOPE, RIGHT EYE;  Surgeon: Rennis Chris, MD;  Location: [facility] OR;  Service: Ophthalmology;  Laterality: Right;   LIPOSUCTION WITH LIPOFILLING Bilateral 11/26/2016   Procedure: LIPOFILLING FOR SYMMETRY;  Surgeon: Peggye Form, DO;  Location: [facility];  Service: Plastics;  Laterality: Bilateral;   LIPOSUCTION WITH LIPOFILLING Bilateral 01/21/2017   Procedure: BILATERAL BREAST  LIPOFILLING FOR ASYMMETRY;  Surgeon: Peggye Form, DO;  Location: [facility];  Service: Plastics;  Laterality: Bilateral;   LIPOSUCTION WITH LIPOFILLING Bilateral 06/28/2020   Procedure: Lipofilling bilateral breasts for asymmetry;  Surgeon: Peggye Form, DO;  Location:  SURGERY CENTER;  Service: Plastics;  Laterality: Bilateral;  90 min   MASTECTOMY Bilateral 01/09/2016   NIPPLE SPARING MASTECTOMY/SENTINAL LYMPH NODE BIOPSY/RECONSTRUCTION/PLACEMENT OF TISSUE EXPANDER Bilateral 01/09/2016   Procedure: BILATERAL NIPPLE SPARING MASTECTOMY WITH LEFT SENTINAL LYMPH NODE BIOPSY ;  Surgeon: Almond Lint, MD;  Location: [facility] OR;  Service: General;  Laterality: Bilateral;   PHOTOCOAGULATION WITH LASER Left 10/04/2020   Procedure: PHOTOCOAGULATION WITH LASER;  Surgeon: Rennis Chris, MD;  Location: [facility] OR;  Service: Ophthalmology;  Laterality: Left;   REMOVAL OF BILATERAL TISSUE EXPANDERS WITH PLACEMENT OF BILATERAL BREAST IMPLANTS Bilateral 08/20/2016   Procedure: REMOVAL OF BILATERAL TISSUE EXPANDERS WITH PLACEMENT OF BILATERAL SILICONE IMPLANTS;  Surgeon: Alena Bills  Dillingham, DO;  Location: [facility];  Service: Plastics;  Laterality: Bilateral;   REMOVAL OF BILATERAL TISSUE EXPANDERS WITH PLACEMENT OF BILATERAL BREAST IMPLANTS Bilateral 11/05/2017   Procedure: REMOVAL OF BILATERAL TISSUE EXPANDERS WITH PLACEMENT OF BILATERAL BREAST SILICONE IMPLANTS;  Surgeon: Peggye Form, DO;  Location: [facility];  Service: Plastics;  Laterality: Bilateral;   REMOVAL OF TISSUE EXPANDER Bilateral 02/11/2016   Procedure: REMOVAL OF BILATERAL TISSUE EXPANDERS AND FLEX HD REMOVAL;  Surgeon: Peggye Form, DO;  Location: [facility];  Service: Plastics;  Laterality: Bilateral;   RETINAL DETACHMENT  SURGERY Left 10/01/2020   Pneumatic retinopexy for repair of rheg RD - Dr. Rennis Chris   RETINAL DETACHMENT SURGERY Left 10/04/2020   PPV - Dr. Rennis Chris   SCLERAL BUCKLE Left 10/04/2020   Procedure: SCLERAL BUCKLE LEFT EYE;  Surgeon: Rennis Chris, MD;  Location: [facility] OR;  Service: Ophthalmology;  Laterality: Left;   TISSUE EXPANDER PLACEMENT Bilateral 08/12/2017   Procedure: PLACEMENT OF BILATERAL TISSUE EXPANDER;  Surgeon: Peggye Form, DO;  Location:  SURGERY CENTER;  Service: Plastics;  Laterality: Bilateral;   TOTAL ABDOMINAL HYSTERECTOMY  05/1996   endometriosis   TOTAL KNEE ARTHROPLASTY Right 03/31/2022   Procedure: RIGHT TOTAL KNEE REPLACEMENT;  Surgeon: Tarry Kos, MD;  Location: [facility];  Service: Orthopedics;  Laterality: Right;   TUBAL LIGATION Bilateral 1991   VITRECTOMY 25 GAUGE WITH SCLERAL BUCKLE Left 10/04/2020   Procedure: 25 GAUGE PARS PLANA VITRECTOMY LEFT EYE ;  Surgeon: Rennis Chris, MD;  Location: [facility] OR;  Service: Ophthalmology;  Laterality: Left;   FAMILY HISTORY Family History  Problem Relation Age of Onset   Colon polyps Mother        approx 2   Other Mother        hx HPV and hysterectomy due to precancerous cells   Depression Mother    Anxiety disorder Mother    Heart failure Father    Prostate cancer Father 37   Retinal detachment Father    Allergic rhinitis Sister    Other Sister 31       hx of hysterectomy for unspecified reason; still has ovaries   Other Sister 18       paternal half-sister hx of hysterectomy for unspecified reason; still has ovaries   Bladder Cancer Maternal Uncle 50       not a smoker   Lung cancer Maternal Uncle 37       smoker   Kidney failure Maternal Grandmother    Congestive Heart Failure Maternal Grandmother    Colon cancer Maternal Grandmother 78   Diabetes Maternal Grandmother    Lung cancer Maternal Grandfather 81       smoker   Breast cancer Paternal Grandmother        dx. early 67s; w/ hx  of trauma to breast   Crohn's disease Daughter    SOCIAL HISTORY Social History   Tobacco Use   Smoking status: Former    Current packs/day: 0.00    Average packs/day: 1 pack/day for 10.0 years (10.0 ttl pk-yrs)    Types: Cigarettes    Start date: 06/08/1998    Quit date: 06/08/2008    Years since quitting: 15.4    Passive exposure: Never   Smokeless tobacco: Never  Vaping Use   Vaping status: Never Used  Substance Use Topics   Alcohol use: No   Drug use: Never       OPHTHALMIC  EXAM:  Not recorded     IMAGING AND PROCEDURES  Imaging and Procedures for 11/06/2023          ASSESSMENT/PLAN:  No diagnosis found.  1. 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 Retinal hole, OD - small operculated hole at 0430 - s/p laser retinopexy OD (01.27.22) in OR -- good laser in place  3. Diabetes mellitus, type 2 without retinopathy - The incidence, risk factors for progression, natural history and treatment options for diabetic retinopathy  were discussed with patient.   - The need for close monitoring of blood glucose, blood pressure, and serum lipids, avoiding cigarette or any type of tobacco, and the need for long term follow up was also discussed with patient. - monitor  4. Pseudophakia OU  - s/p CE/IOL OU (OS: [place], 04.21.22, OD: 07.22)  - IOLs in good position, beautiful surgeries  - Glasses not matching BCVA -- would likely benefit from updated MRx - monitor  Ophthalmic Meds Ordered this visit:  No  orders of the defined types were placed in this encounter.    No follow-ups on file.  There are no Patient Instructions on file for this visit.   Karie Chimera, M.D., Ph.D. Diseases & Surgery of the Retina and Vitreous [facility] This document serves as a record of services personally performed by Karie Chimera, MD, PhD. It was created on their behalf by Berlin Hun COT, an ophthalmic technician. The creation of this record is the provider's dictation and/or activities during the visit.    Electronically signed by: Berlin Hun COT 02.27.25 11:08 AM    Abbreviations: M myopia (nearsighted); A astigmatism; H hyperopia (farsighted); P presbyopia; Mrx spectacle prescription;  CTL contact lenses; OD right eye; OS left eye; OU both eyes  XT exotropia; ET esotropia; PEK punctate epithelial keratitis; PEE punctate epithelial erosions; DES dry eye syndrome; MGD meibomian gland dysfunction; ATs artificial tears; PFAT's preservative free artificial tears; NSC nuclear sclerotic cataract; PSC posterior subcapsular cataract; ERM epi-retinal membrane; PVD posterior vitreous detachment; RD retinal detachment; DM diabetes mellitus; DR diabetic retinopathy; NPDR non-proliferative diabetic retinopathy; PDR proliferative diabetic retinopathy; CSME clinically significant macular edema; DME diabetic macular edema; dbh dot blot hemorrhages; CWS cotton wool spot; POAG primary open angle glaucoma; C/D cup-to-disc ratio; HVF humphrey visual field; GVF goldmann visual field; OCT optical coherence tomography; IOP intraocular pressure; BRVO Branch retinal vein occlusion; CRVO central retinal vein occlusion; CRAO central retinal artery occlusion; BRAO branch retinal artery occlusion; RT retinal tear; SB scleral buckle; PPV pars plana vitrectomy; VH Vitreous hemorrhage; PRP panretinal laser photocoagulation; IVK intravitreal kenalog; VMT vitreomacular traction; MH Macular hole;  NVD  neovascularization of the disc; NVE neovascularization elsewhere; AREDS age related eye disease study; ARMD age related macular degeneration; POAG primary open angle glaucoma; EBMD epithelial/anterior basement membrane dystrophy;  ACIOL anterior chamber intraocular lens; IOL intraocular lens; PCIOL posterior chamber intraocular lens; Phaco/IOL phacoemulsification with intraocular lens placement; PRK photorefractive keratectomy; LASIK laser assisted in situ keratomileusis; HTN hypertension; DM diabetes mellitus; COPD chronic obstructive pulmonary disease

## 2023-11-05 NOTE — Progress Notes (Unsigned)
 [facility] Return Visit  SUBJECTIVE  History of Present Illness: Christina [name] is a 58 y.o. female seen in follow-up for mixed urinary incontinence, nocturia, history of pelvic surgery, and stage III pelvic organ prolapse. Plan at last visit was undergoing urodynamics.   Urinary leakage is most bothersome SUI 5-6x/day, with small volume leakage UUI 5-6x/day with less bother Did not start mirabegron 25mg  due to insurance Tried oxybutynin without relief with dry mouth, eyes and constipation Leaks 7-8 time(s) per days.  Drinks: 4-6 Diet Pepsi per day reduced to 1.5 bottle/day, increased to 16oz water per day  Reports OSA, denies CPAP coverage by insurance Leg swelling due to h/o R knee replacement, difficulty putting compression socks on T2DM with HbA1C 6.5 on 09/03/23 Reports history of IBS-C with intermittent Linzess use H/o PONV  Urodynamic Impression 10/14/23:  Uroflow Qmax 31mL/sec, voided , PVR 20mL 1. Sensation was increased; capacity was normal 2. Stress Incontinence was demonstrated at normal pressures; 3. 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 4. Emptying was dysfunctional with a normal PVR 40, a sustained detrusor contraction present,  abdominal straining present, dyssynergic urethral sphincter activity on EMG. Pdet at Qmax was 14.5 cm of water.  Qmax was 16.2 mL/sec.   History of PE 05/08/22 after knee surgery, anti-coagulation for 6 months.  Reports history of bladder surgery at [facility] over 20 yrs ago with possible mesh complicated by bladder injury and discharged home with foley for 1 week.   Past Medical History: Patient  has a past medical history of Anemia, Arthritis, 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, left (oncologist--- dr Pamelia Hoit), Cataracts, bilateral, Depression, Diabetes mellitus without complication (HCC), GAD (generalized anxiety disorder), Gallbladder problem, History of ovarian cyst, IBS (irritable bowel  syndrome), Joint pain, OSA (obstructive sleep apnea) (09/08/2023), PONV (postoperative nausea and vomiting), Retinal detachment, Right knee meniscal tear, and Urgency of urination.   Past Surgical History: She  has a past surgical history that includes Cesarean section (1610; 1989; 1991); Achilles tendon repair (Right, 2010;  revision 2011); Breast reconstruction with placement of tissue expander and flex hd (acellular hydrated dermis) (Bilateral, 01/09/2016); Nipple sparing mastectomy/sentinal lymph node biopsy/reconstruction/placement of tissue expander (Bilateral, 01/09/2016); Breast reconstruction with placement of tissue expander and flex hd (acellular hydrated dermis) (Bilateral, 01/09/2016); Incision and drainage of wound (Bilateral, 02/11/2016); Removal of tissue expander (Bilateral, 02/11/2016); Breast reconstruction with placement of tissue expander and flex hd (acellular hydrated dermis) (Bilateral, 05/29/2016); Removal of bilateral tissue expanders with placement of bilateral breast implants (Bilateral, 08/20/2016); Breast reduction surgery (Bilateral, 11/26/2016); Liposuction with lipofilling (Bilateral, 11/26/2016); Liposuction with lipofilling (Bilateral, 01/21/2017); Breast implant removal (Bilateral, 08/12/2017); Tissue expander placement (Bilateral, 08/12/2017); Removal of bilateral tissue expanders with placement of bilateral breast implants (Bilateral, 11/05/2017); Mastectomy (Bilateral, 01/09/2016); Total abdominal hysterectomy (05/1996); Laparoscopic cholecystectomy (~ 1999); Tubal ligation (Bilateral, 1991); Breast biopsy (Left, 10/2015); Laparoscopic appendectomy (04-07-2011   @[facility] ); Elbow surgery (Right, x3   last one 02-01-2019 @ [facility]); Bladder suspension (2000); FAT GRAFTING BILATERAL BREAST (08-09-2018  @[facility] ); Knee arthroscopy with medial menisectomy (Right, 12/20/2019); Liposuction with lipofilling (Bilateral, 06/28/2020); Retinal detachment surgery (Left, 10/01/2020); Retinal  detachment surgery (Left, 10/04/2020); Eye surgery (Left, 10/01/2020); Eye surgery (Left, 10/04/2020); Vitrectomy 25 gauge with scleral buckle (Left, 10/04/2020); Scleral buckle (Left, 10/04/2020); Laser photo ablation (Right, 10/04/2020); Gas/fluid exchange (Left, 10/04/2020); Gas insertion (Left, 10/04/2020); Photocoagulation with laser (Left, 10/04/2020); Knee arthroscopy with medial menisectomy (Right, 10/30/2021); Total knee arthroplasty (Right, 03/31/2022); and Bilateral total mastectomy with axillary lymph node dissection.  Medications: She has a current medication list which includes the following prescription(s): desvenlafaxine, estradiol, hydrochlorothiazide, hydroxyzine, tramadol, gemtesa, gemtesa, and mirabegron er.   Allergies: Patient has no known allergies.   Social History: Patient  reports that she quit smoking about 15 years ago. Her smoking use included cigarettes. She started smoking about 25 years ago. She has a 10 pack-year smoking history. She has never been exposed to tobacco smoke. She has never used smokeless tobacco. She reports that she does not drink alcohol and does not use drugs.     OBJECTIVE     Physical Exam: Vitals:   11/06/23 1049  BP: 136/77  Pulse: 61     ASSESSMENT AND PLAN    [name] is a 58 y.o. with:  1. SUI (stress urinary incontinence, female)   2. OAB (overactive bladder)   3. Nocturia   4. Posterior vaginal wall prolapse   5. History of pulmonary embolism   6. Type 2 diabetes mellitus without complication, without long-term current use of insulin (HCC)     SUI (stress urinary incontinence, female) Assessment & Plan: - most bothersome - For treatment of stress urinary incontinence,  non-surgical options include expectant management, weight loss, physical therapy, as well as a pessary.  Surgical options include a midurethral sling, Burch urethropexy, and transurethral injection of a bulking agent. - urodynamics with SUI and  dysfunctional emptying, discussed increased risk of postoperative urinary retention   OAB (overactive bladder) Assessment & Plan: - encouraged to continue caffeine reduction  - Trial of gemtesa with samples and Rx provided - We discussed the symptoms of overactive bladder (OAB), which include urinary urgency, urinary frequency, nocturia, with or without urge incontinence.  While we do not know the exact etiology of OAB, several treatment options exist. We discussed management including behavioral therapy (decreasing bladder irritants, urge suppression strategies, timed voids, bladder retraining), physical therapy, medication; for refractory cases posterior tibial nerve stimulation, sacral neuromodulation, and intravesical botulinum toxin injection.  For anticholinergic medications, we discussed the potential side effects of anticholinergics including dry eyes, dry mouth, constipation, cognitive impairment and urinary retention. For Beta-3 agonist medication, we discussed the potential side effect of elevated blood pressure which is more likely to occur in individuals with uncontrolled hypertension. - discussed cystoscopy needed due to h/o bladder surgery to r/o foreign material in bladder. Pt reports possible mesh complicated by bladder injury and discharged home with foley for 1 week.  - discussed possible need for additional OAB treatment postop    Nocturia Assessment & Plan: - trial of Gemtesa - avoid fluid intake 3 hours before bedtime - elevated feet during the day or use compression socks to reduce lower extremity swelling - diagnosed with sleep apnea, however CPAP not covered by insurance    Posterior vaginal wall prolapse Assessment & Plan: - For treatment of pelvic organ prolapse, we discussed options for management including expectant management, conservative management, and surgical management, such as Kegels, a pessary, pelvic floor physical therapy, and specific surgical  procedures. - pt desires surgical intervention, discussed need to optimize stool consistency prior to surgery. For constipation, we reviewed the importance of a better bowel regimen.  We also discussed the importance of avoiding chronic straining, as it can exacerbate her pelvic floor symptoms; we discussed treating constipation and straining prior to surgery, as postoperative straining can lead to damage to the repair and recurrence of symptoms. We discussed initiating therapy with increasing fluid intake, fiber supplementation, stool softeners, and laxatives such as miralax.  -  discussed repeat exam to r/o apical prolapse We discussed two options for apical prolapse repair:  1) vaginal repair without mesh - Pros - safer, no mesh complications - Cons - not as strong as mesh repair, higher risk of recurrence  2) laparoscopic repair with mesh - Pros - stronger, better long-term success - Cons - risks of mesh implant (erosion into vagina or bladder, adhering to the rectum, pain) - these risks are lower than with a vaginal mesh but still exist - pt desires to proceed with sacrocolpopexy if apical prolapse noted - encouraged weight reduction and Kegel exercises   History of pulmonary embolism Assessment & Plan: - history of PE after knee surgery with anti-coagulation for 6 months - discussed need for perioperative anti-coagluation for 4 weeks postop   Type 2 diabetes mellitus without complication, without long-term current use of insulin (HCC) Assessment & Plan: - HbA1C 6.5 on 09/03/23, discussed need to maintain goal < 8 prior to surgery - discussed increased risk of perioperative complications, CV/pulm complications, infection, wound complications with DM - discussed possible need for adjustment of medication postop if glucose > 200   Other orders -     Gemtesa; Take 1 tablet (75 mg total) by mouth daily.  Dispense: 28 tablet -     Gemtesa; Take 1 tablet (75 mg total) by mouth daily.   Dispense: 30 tablet; Refill: 2  Time spent: I spent 50 minutes dedicated to the care of this patient on the date of this encounter to include pre-visit review of records, face-to-face time with the patient discussing mixed urinary incontinence, stage II pelvic organ prolpase, h/o PE, T2DM, nocturia, and post visit documentation and ordering medication.   Loleta Chance, MD

## 2023-11-06 ENCOUNTER — Encounter (INDEPENDENT_AMBULATORY_CARE_PROVIDER_SITE_OTHER): Payer: 59 | Admitting: Ophthalmology

## 2023-11-06 ENCOUNTER — Encounter: Payer: Self-pay | Admitting: Obstetrics

## 2023-11-06 ENCOUNTER — Encounter (INDEPENDENT_AMBULATORY_CARE_PROVIDER_SITE_OTHER): Payer: Self-pay

## 2023-11-06 ENCOUNTER — Ambulatory Visit: Payer: 59 | Admitting: Obstetrics

## 2023-11-06 VITALS — BP 136/77 | HR 61

## 2023-11-06 DIAGNOSIS — N3281 Overactive bladder: Secondary | ICD-10-CM | POA: Diagnosis not present

## 2023-11-06 DIAGNOSIS — N816 Rectocele: Secondary | ICD-10-CM | POA: Diagnosis not present

## 2023-11-06 DIAGNOSIS — N393 Stress incontinence (female) (male): Secondary | ICD-10-CM | POA: Insufficient documentation

## 2023-11-06 DIAGNOSIS — E119 Type 2 diabetes mellitus without complications: Secondary | ICD-10-CM

## 2023-11-06 DIAGNOSIS — H33321 Round hole, right eye: Secondary | ICD-10-CM

## 2023-11-06 DIAGNOSIS — R351 Nocturia: Secondary | ICD-10-CM | POA: Insufficient documentation

## 2023-11-06 DIAGNOSIS — Z961 Presence of intraocular lens: Secondary | ICD-10-CM

## 2023-11-06 DIAGNOSIS — Z86711 Personal history of pulmonary embolism: Secondary | ICD-10-CM

## 2023-11-06 DIAGNOSIS — H3322 Serous retinal detachment, left eye: Secondary | ICD-10-CM

## 2023-11-06 MED ORDER — GEMTESA 75 MG PO TABS: 75.0000 mg | ORAL_TABLET | Freq: Every day | ORAL | 2 refills | Status: DC

## 2023-11-06 MED ORDER — GEMTESA 75 MG PO TABS: 75.0000 mg | ORAL_TABLET | Freq: Every day | ORAL | Status: DC

## 2023-11-06 NOTE — Assessment & Plan Note (Signed)
-   trial of Gemtesa - avoid fluid intake 3 hours before bedtime - elevated feet during the day or use compression socks to reduce lower extremity swelling - diagnosed with sleep apnea, however CPAP not covered by insurance

## 2023-11-06 NOTE — Progress Notes (Signed)
 This encounter was created in error - please disregard.

## 2023-11-06 NOTE — Assessment & Plan Note (Signed)
-   history of PE after knee surgery with anti-coagulation for 6 months - discussed need for perioperative anti-coagluation for 4 weeks postop

## 2023-11-06 NOTE — Assessment & Plan Note (Signed)
-   HbA1C 6.5 on 09/03/23, discussed need to maintain goal < 8 prior to surgery - discussed increased risk of perioperative complications, CV/pulm complications, infection, wound complications with DM - discussed possible need for adjustment of medication postop if glucose > 200

## 2023-11-06 NOTE — Patient Instructions (Signed)
 We will proceed with your cystoscopy.   We discussed the symptoms of overactive bladder (OAB), which include urinary urgency, urinary frequency, night-time urination, with or without urge incontinence.  We discussed management including behavioral therapy (decreasing bladder irritants by following a bladder diet, urge suppression strategies, timed voids, bladder retraining), physical therapy, medication; and for refractory cases posterior tibial nerve stimulation, sacral neuromodulation, and intravesical botulinum toxin injection.   For Beta-3 agonist medication, we discussed the potential side effect of elevated blood pressure which is more likely to occur in individuals with uncontrolled hypertension. You were given samples for Gemtesa 75 mg.  It can take a month to start working so give it time, but if you have bothersome side effects call sooner and we can try a different medication.  Call us if you have trouble filling the prescription or if it's not covered by your insurance.  Continue caffeine reduction and fluid management.   Continue Kegel exercises.   Constipation: Our goal is to achieve formed bowel movements daily or every-other-day.  You may need to try different combinations of the following options to find what works best for you - everybody's body works differently so feel free to adjust the dosages as needed.  Some options to help maintain bowel health include:  Dietary changes (more leafy greens, vegetables and fruits; less processed foods) Fiber supplementation (Benefiber, FiberCon, Metamucil or Psyllium). Start slow and increase gradually to full dose. Over-the-counter agents such as: stool softeners (Docusate or Colace) and/or laxatives (Miralax, milk of magnesia)  "Power Pudding" is a natural mixture that may help your constipation.  To make blend 1 cup applesauce, 1 cup wheat bran, and 3/4 cup prune juice, refrigerate and then take 1 tablespoon daily with a large glass of water  as needed.   Women should try to eat at least 21 to 25 grams of fiber a day, while men should aim for 30 to 38 grams a day. You can add fiber to your diet with food or a fiber supplement such as psyllium (metamucil), benefiber, or fibercon.   Here's a look at how much dietary fiber is found in some common foods. When buying packaged foods, check the Nutrition Facts label for fiber content. It can vary among brands.  Fruits Serving size Total fiber (grams)*  Raspberries 1 cup 8.0  Pear 1 medium 5.5  Apple, with skin 1 medium 4.5  Banana 1 medium 3.0  Orange 1 medium 3.0  Strawberries 1 cup 3.0   Vegetables Serving size Total fiber (grams)*  Green peas, boiled 1 cup 9.0  Broccoli, boiled 1 cup chopped 5.0  Turnip greens, boiled 1 cup 5.0  Brussels sprouts, boiled 1 cup 4.0  Potato, with skin, baked 1 medium 4.0  Sweet corn, boiled 1 cup 3.5  Cauliflower, raw 1 cup chopped 2.0  Carrot, raw 1 medium 1.5   Grains Serving size Total fiber (grams)*  Spaghetti, whole-wheat, cooked 1 cup 6.0  Barley, pearled, cooked 1 cup 6.0  Bran flakes 3/4 cup 5.5  Quinoa, cooked 1 cup 5.0  Oat bran muffin 1 medium 5.0  Oatmeal, instant, cooked 1 cup 5.0  Popcorn, air-popped 3 cups 3.5  Brown rice, cooked 1 cup 3.5  Bread, whole-wheat 1 slice 2.0  Bread, rye 1 slice 2.0   Legumes, nuts and seeds Serving size Total fiber (grams)*  Split peas, boiled 1 cup 16.0  Lentils, boiled 1 cup 15.5  Black beans, boiled 1 cup 15.0  Baked beans, canned 1 cup 10.0  Chia seeds 1 ounce 10.0  Almonds 1 ounce (23 nuts) 3.5  Pistachios 1 ounce (49 nuts) 3.0  Sunflower kernels 1 ounce 3.0  *Rounded to nearest 0.5 gram. Source: [employer] for [employer]	[place]

## 2023-11-06 NOTE — Assessment & Plan Note (Signed)
-   For treatment of pelvic organ prolapse, we discussed options for management including expectant management, conservative management, and surgical management, such as Kegels, a pessary, pelvic floor physical therapy, and specific surgical procedures. - pt desires surgical intervention, discussed need to optimize stool consistency prior to surgery. For constipation, we reviewed the importance of a better bowel regimen.  We also discussed the importance of avoiding chronic straining, as it can exacerbate her pelvic floor symptoms; we discussed treating constipation and straining prior to surgery, as postoperative straining can lead to damage to the repair and recurrence of symptoms. We discussed initiating therapy with increasing fluid intake, fiber supplementation, stool softeners, and laxatives such as miralax.  - discussed repeat exam to r/o apical prolapse We discussed two options for apical prolapse repair:  1) vaginal repair without mesh - Pros - safer, no mesh complications - Cons - not as strong as mesh repair, higher risk of recurrence  2) laparoscopic repair with mesh - Pros - stronger, better long-term success - Cons - risks of mesh implant (erosion into vagina or bladder, adhering to the rectum, pain) - these risks are lower than with a vaginal mesh but still exist - pt desires to proceed with sacrocolpopexy if apical prolapse noted - encouraged weight reduction and Kegel exercises

## 2023-11-06 NOTE — Assessment & Plan Note (Addendum)
-   encouraged to continue caffeine reduction  - Trial of gemtesa with samples and Rx provided - We discussed the symptoms of overactive bladder (OAB), which include urinary urgency, urinary frequency, nocturia, with or without urge incontinence.  While we do not know the exact etiology of OAB, several treatment options exist. We discussed management including behavioral therapy (decreasing bladder irritants, urge suppression strategies, timed voids, bladder retraining), physical therapy, medication; for refractory cases posterior tibial nerve stimulation, sacral neuromodulation, and intravesical botulinum toxin injection.  For anticholinergic medications, we discussed the potential side effects of anticholinergics including dry eyes, dry mouth, constipation, cognitive impairment and urinary retention. For Beta-3 agonist medication, we discussed the potential side effect of elevated blood pressure which is more likely to occur in individuals with uncontrolled hypertension. - discussed cystoscopy needed due to h/o bladder surgery to r/o foreign material in bladder. Pt reports possible mesh complicated by bladder injury and discharged home with foley for 1 week.  - discussed possible need for additional OAB treatment postop

## 2023-11-06 NOTE — Assessment & Plan Note (Addendum)
-   most bothersome - For treatment of stress urinary incontinence,  non-surgical options include expectant management, weight loss, physical therapy, as well as a pessary.  Surgical options include a midurethral sling, Burch urethropexy, and transurethral injection of a bulking agent. - urodynamics with SUI and dysfunctional emptying, discussed increased risk of postoperative urinary retention

## 2023-11-09 ENCOUNTER — Ambulatory Visit: Payer: 59 | Admitting: Family Medicine

## 2023-11-09 ENCOUNTER — Telehealth: Payer: Self-pay

## 2023-11-09 ENCOUNTER — Telehealth: Payer: Self-pay | Admitting: *Deleted

## 2023-11-09 ENCOUNTER — Encounter: Payer: Self-pay | Admitting: Family Medicine

## 2023-11-09 DIAGNOSIS — F411 Generalized anxiety disorder: Secondary | ICD-10-CM

## 2023-11-09 DIAGNOSIS — F321 Major depressive disorder, single episode, moderate: Secondary | ICD-10-CM

## 2023-11-09 MED ORDER — DESVENLAFAXINE SUCCINATE ER 50 MG PO TB24: 50.0000 mg | ORAL_TABLET | Freq: Every day | ORAL | 1 refills | Status: DC

## 2023-11-09 NOTE — Telephone Encounter (Signed)
 The pt has been set up to see Dr Barron Alvine on 01/27/24 at 9 am  Letter mailed to the pt with appt info

## 2023-11-09 NOTE — Telephone Encounter (Signed)
 My Chart message sent

## 2023-11-09 NOTE — Telephone Encounter (Signed)
 Mansouraty, Netty Starring., MD  Daisy Lazar, Counselor; [place], Verlin Dike, DO; Loretha Stapler, RN KLP, Thanks for letting us know. We are going to do our best to get these patients in, discuss their willingness to proceed with screening and then get them going. Plan will be MRI alternating EUS yearly.   Rainna Nearhood, Please reach out to patient and schedule the patient a clinic visit with myself or Dr. Barron Alvine (okay to use overbook or held slot with me).  We will discuss whether she wants to move forward with high risk pancreatic cancer screening. Thanks. GM

## 2023-11-09 NOTE — Telephone Encounter (Signed)
 Contacted pt to see if she would like to change her cancelled appt to virtual and she declined.  She stated that provider could cancel order for CPAP because they were not going to pay for it and her insurance runs out at end of month.  She also would like provider to message her with the correct way to wean herself off of pristiq due to insurance running out.

## 2023-11-09 NOTE — Telephone Encounter (Signed)
-----   Message from [facility] sent at 11/05/2023 11:29 PM EST ----- KLP, Thanks for letting us know. We are going to do our best to get these patients in, discuss their willingness to proceed with screening and then get them going. Plan will be MRI alternating EUS yearly.   Miray Mancino, Please reach out to patient and schedule the patient a clinic visit with myself or Dr. Barron Alvine (okay to use overbook or held slot with me).  We will discuss whether she wants to move forward with high risk pancreatic cancer screening. Thanks. GM ----- Message ----- From: Daisy Lazar, Counselor Sent: 11/04/2023   9:32 AM EST To: Lemar Lofty., MD; #  GM and VC - we went out messages to patients who have ATM and BRCA2 pathogenic variants to notify them of the updated NCCN guidelines for pancreatic cancer screening.  This patient responded and wanted a referral to you all.  I will put that in, but thought I would let you know ahead of that referral. She was tested in 2017.

## 2023-11-09 NOTE — Telephone Encounter (Signed)
 Christina [name] is a 58 y.o. female called in because she wanted to let Dr. Olena Leatherwood know she is not going to take the medication prescribed and she has no plans on returning to [facility].

## 2023-11-10 NOTE — Telephone Encounter (Signed)
 Pt states that this can be cancelled due to insurance.

## 2023-11-17 ENCOUNTER — Ambulatory Visit: Payer: 59 | Admitting: Family Medicine

## 2023-11-17 NOTE — Progress Notes (Signed)
 PA request was faxed to [facility] AT [phone] for procedure : Cystoscopy CPT code: 09811 PA is needed for this procedure. PA request was: PENDING

## 2023-11-18 ENCOUNTER — Ambulatory Visit (INDEPENDENT_AMBULATORY_CARE_PROVIDER_SITE_OTHER): Admitting: Family Medicine

## 2023-11-18 ENCOUNTER — Encounter: Payer: Self-pay | Admitting: Family Medicine

## 2023-11-18 ENCOUNTER — Encounter (INDEPENDENT_AMBULATORY_CARE_PROVIDER_SITE_OTHER): Payer: 59 | Admitting: Ophthalmology

## 2023-11-18 VITALS — BP 125/70 | HR 70 | Ht 67.0 in | Wt 218.4 lb

## 2023-11-18 DIAGNOSIS — L918 Other hypertrophic disorders of the skin: Secondary | ICD-10-CM | POA: Diagnosis not present

## 2023-11-18 DIAGNOSIS — Z419 Encounter for procedure for purposes other than remedying health state, unspecified: Secondary | ICD-10-CM | POA: Diagnosis not present

## 2023-11-18 NOTE — Progress Notes (Unsigned)
  Acute Office Visit  Subjective:     Patient ID: Christina [name], female    DOB: Jun 15, 1966, 58 y.o.   MRN: [id number]  Chief Complaint  Patient presents with   Skin Tag    HPI Patient is in today for eyelid lesion.  Patient noticed a growth on her left thigh below the left eyebrow and above the eyelid a few weeks ago.  Sometimes itches, no pain.  Does not interfere with her vision.  She would like it removed.  ROS      Objective:    BP 125/70   Pulse 70   Ht 5\' 7"  (1.702 m)   Wt 218 lb 6.4 oz (99.1 kg)   LMP  (LMP Unknown)   SpO2 98%   BMI 34.21 kg/m    Physical Exam  General: Alert, oriented Skin: Small skin tag on the skin below the left eyebrow and just above the left eyelid.  See picture below.     No results found for any visits on 11/18/23.      Assessment & Plan:   Skin tag Assessment & Plan: Patient has a small skin tag below her left eyebrow and above her left eyelid that has been present for the past 2 weeks.  Sometimes it itches but not painful.  Not affecting vision.  Patient okay with removal of this today.  Patient was placed in the supine position.  The area was cleaned with alcohol.  Forceps were used to isolate the skin tag, which was then removed with sterile scissors.  Bleeding was minimal.  No bandage used due to location of the lesion.  Pressure was applied with gauze.       Return if symptoms worsen or fail to improve.  Sandre Kitty, MD

## 2023-11-18 NOTE — Patient Instructions (Signed)
 It was nice to see you today,  We addressed the following topics today: -No special treatment is required for the area we treated.  You can just wash with soap and water regularly.  If you feel like it is getting red or becoming irritated or if you are worried about infection please let us know and we can reexamine it. - If it comes back I will probably have you see a dermatologist.  Have a great day,  Frederic Jericho, MD

## 2023-11-18 NOTE — Assessment & Plan Note (Signed)
 Patient has a small skin tag below her left eyebrow and above her left eyelid that has been present for the past 2 weeks.  Sometimes it itches but not painful.  Not affecting vision.  Patient okay with removal of this today.  Patient was placed in the supine position.  The area was cleaned with alcohol.  Forceps were used to isolate the skin tag, which was then removed with sterile scissors.  Bleeding was minimal.  No bandage used due to location of the lesion.  Pressure was applied with gauze.

## 2023-11-20 ENCOUNTER — Other Ambulatory Visit: Payer: Self-pay | Admitting: Family Medicine

## 2023-11-20 ENCOUNTER — Ambulatory Visit: Payer: 59 | Admitting: Plastic Surgery

## 2023-11-20 DIAGNOSIS — G4733 Obstructive sleep apnea (adult) (pediatric): Secondary | ICD-10-CM

## 2023-11-23 NOTE — Progress Notes (Signed)
 NO PA Required per [facility]

## 2023-11-24 NOTE — Addendum Note (Signed)
 Addended by: Sandre Kitty on: 11/24/2023 06:19 AM   Modules accepted: Orders

## 2023-11-26 ENCOUNTER — Telehealth: Payer: Self-pay | Admitting: *Deleted

## 2023-11-26 NOTE — Telephone Encounter (Signed)
 Copied from CRM [id number]. Topic: Clinical - Prescription Issue >> Nov 26, 2023 10:00 AM Shon Hale wrote: Reason for CRM: Patient calling about cpap machine. Patient states insurance denied coverage for CPAP machine and told her that she does not need it. Patient requesting assistance with this.   Patient has [facility].   Requesting call back, [phone]

## 2023-11-26 NOTE — Telephone Encounter (Signed)
 Contacted [facility] and they stated that this is still in process, they received the correct order and submitted it.  I contacted patient and informed her of this.

## 2023-11-27 ENCOUNTER — Other Ambulatory Visit: Payer: Self-pay

## 2023-11-27 ENCOUNTER — Encounter: Payer: Self-pay | Admitting: Family Medicine

## 2023-11-27 ENCOUNTER — Ambulatory Visit (INDEPENDENT_AMBULATORY_CARE_PROVIDER_SITE_OTHER): Admitting: Family Medicine

## 2023-11-27 VITALS — BP 138/88 | Ht 67.0 in | Wt 200.0 lb

## 2023-11-27 DIAGNOSIS — M79671 Pain in right foot: Secondary | ICD-10-CM | POA: Diagnosis not present

## 2023-11-27 MED ORDER — MELOXICAM 15 MG PO TABS: 15.0000 mg | ORAL_TABLET | Freq: Every day | ORAL | 0 refills | Status: DC

## 2023-11-27 NOTE — Progress Notes (Signed)
 PCP: [name], PA  Chief Complaint: Right foot pain Subjective:   HPI: Patient is a 58 y.o. female here for right foot pain on the lateral aspect of her foot that has been going on for approximately 9 months now.  Patient was previously seen in summer last year by orthopedics who had an x-ray done which did show some cortical irregularity near the cuboid.  Patient subsequently had an MRI 3 months later.  Patient states that between those 3 months the pain did get a little bit better but not completely.  Patient had MRI which did not show any significant findings.  Patient states that the pain eventually got better but is now come back and it is significant.  He states that she walks all day she works at a [employer] and cannot bear weight due to the pain.  Pain states all the pain is located on the lateral aspect of her foot.. Patient denies any trauma or recent injury.  Patient states that she is never broken of any bones as far she knows.  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,  multifocal IDC, DCIS, ER/PR+,  01-09-2016 s/p bilteral mastectomies w/ left sln bx;  no chemoradiation   Cataracts, bilateral    Depression    Diabetes mellitus without complication (HCC)    type 2   GAD (generalized anxiety disorder)    Gallbladder problem    History of ovarian cyst    IBS (irritable bowel syndrome)    Joint pain    OSA (obstructive sleep apnea) 09/08/2023   PONV (postoperative nausea and vomiting)    does well with scop patch   Retinal detachment    Rheg OS   Right knee meniscal tear    Urgency of urination     Current Outpatient Medications on File Prior to Visit  Medication Sig Dispense Refill   [START ON 11/30/2023] desvenlafaxine (PRISTIQ) 50 MG 24 hr tablet Take 1 tablet (50 mg total) by mouth daily. 90 tablet 1   estradiol (ESTRACE) 0.1 MG/GM  vaginal cream Place 0.5 g vaginally 2 (two) times a week. Place 0.5g nightly for two weeks then twice a week after 42.5 g 11   hydrochlorothiazide (HYDRODIURIL) 12.5 MG tablet Take 1 tablet (12.5 mg total) by mouth daily. 90 tablet 1   hydrOXYzine (ATARAX) 50 MG tablet Take 1/2 to 1 tablet (25 to 50 mg total) every 8 hours as needed for breakthrough anxiety. 30 tablet 1   mirabegron ER (MYRBETRIQ) 25 MG TB24 tablet Take 1 tablet (25 mg total) by mouth daily. (Patient not taking: Reported on 11/06/2023) 30 tablet 5   traMADol (ULTRAM) 50 MG tablet Take 1 tablet (50 mg total) by mouth 2 (two) times daily as needed. 30 tablet 2   Vibegron (GEMTESA) 75 MG TABS Take 1 tablet (75 mg total) by mouth daily. 28 tablet    Vibegron (GEMTESA) 75 MG TABS Take 1 tablet (75 mg total) by mouth daily. 30 tablet 2   No current facility-administered medications on file prior to visit.    Past Surgical History:  Procedure Laterality Date   ACHILLES TENDON REPAIR Right 2010;  revision 2011   BILATERAL TOTAL MASTECTOMY WITH AXILLARY LYMPH NODE DISSECTION     BLADDER SUSPENSION  2000   BREAST BIOPSY Left 10/2015   BREAST  IMPLANT REMOVAL Bilateral 08/12/2017   Procedure: REMOVAL BILATERAL BREAST IMPLANTS;  Surgeon: Peggye Form, DO;  Location: [facility];  Service: Plastics;  Laterality: Bilateral;   BREAST RECONSTRUCTION WITH PLACEMENT OF TISSUE EXPANDER AND FLEX HD (ACELLULAR HYDRATED DERMIS) Bilateral 01/09/2016   BREAST RECONSTRUCTION WITH PLACEMENT OF TISSUE EXPANDER AND FLEX HD (ACELLULAR HYDRATED DERMIS) Bilateral 01/09/2016   Procedure: BREAST RECONSTRUCTION WITH PLACEMENT OF TISSUE EXPANDER AND FLEX HD (ACELLULAR HYDRATED DERMIS);  Surgeon: Alena Bills Dillingham, DO;  Location: [facility] OR;  Service: Plastics;  Laterality: Bilateral;   BREAST RECONSTRUCTION WITH PLACEMENT OF TISSUE EXPANDER AND FLEX HD (ACELLULAR HYDRATED DERMIS) Bilateral 05/29/2016   Procedure: PLACEMENT OF BILATERAL TISSUE  EXPANDER AND FLEX HD (ACELLULAR HYDRATED DERMIS);  Surgeon: Peggye Form, DO;  Location: [facility];  Service: Plastics;  Laterality: Bilateral;   BREAST REDUCTION SURGERY Bilateral 11/26/2016   Procedure: BILATERAL BREAST CAPSULE CONTRACTURE RELASE;  Surgeon: Peggye Form, DO;  Location: [facility];  Service: Plastics;  Laterality: Bilateral;   CESAREAN SECTION  1987; 1989; 1991   ELBOW SURGERY Right x3   last one 02-01-2019 @ [facility]   EYE SURGERY Left 10/01/2020   Pneumatic retinopexy for repair of rheg RD - Dr. Rennis Chris   EYE SURGERY Left 10/04/2020   PPV - Dr. Rennis Chris   FAT GRAFTING BILATERAL BREAST  08-09-2018  @[facility]    GAS INSERTION Left 10/04/2020   Procedure: INSERTION OF GAS;  Surgeon: Rennis Chris, MD;  Location: [facility] OR;  Service: Ophthalmology;  Laterality: Left;   GAS/FLUID EXCHANGE Left 10/04/2020   Procedure: GAS/FLUID EXCHANGE;  Surgeon: Rennis Chris, MD;  Location: [facility] OR;  Service: Ophthalmology;  Laterality: Left;   INCISION AND DRAINAGE OF WOUND Bilateral 02/11/2016   Procedure: IRRIGATION AND DEBRIDEMENT OF BILATERAL BREAST POCKET;  Surgeon: Peggye Form, DO;  Location: [facility];  Service: Plastics;  Laterality: Bilateral;   KNEE ARTHROSCOPY WITH MEDIAL MENISECTOMY Right 12/20/2019   Procedure: KNEE ARTHROSCOPY WITH MEDIAL MENISECTOMY;  Surgeon: Teryl Lucy, MD;  Location: [facility];  Service: Orthopedics;  Laterality: Right;   KNEE ARTHROSCOPY WITH MEDIAL MENISECTOMY Right 10/30/2021   Procedure: RIGHT KNEE ARTHROSCOPY WITH PARTIAL MEDIAL MENISECTOMY SYNOVECTOMY;  Surgeon: Tarry Kos, MD;  Location: [facility];  Service: Orthopedics;  Laterality: Right;   LAPAROSCOPIC APPENDECTOMY  04-07-2011   @[facility]    w/ Excision peritoneal lipoma and lysis adhesions   LAPAROSCOPIC CHOLECYSTECTOMY  ~ 1999   LASER PHOTO ABLATION Right 10/04/2020   Procedure: LASER RETINOPEXY  WITH INDIRECT LASER OPTHALMOSCOPE, RIGHT EYE;  Surgeon: Rennis Chris, MD;  Location: [facility] OR;  Service: Ophthalmology;  Laterality: Right;   LIPOSUCTION WITH LIPOFILLING Bilateral 11/26/2016   Procedure: LIPOFILLING FOR SYMMETRY;  Surgeon: Peggye Form, DO;  Location: [facility];  Service: Plastics;  Laterality: Bilateral;   LIPOSUCTION WITH LIPOFILLING Bilateral 01/21/2017   Procedure: BILATERAL BREAST  LIPOFILLING FOR ASYMMETRY;  Surgeon: Peggye Form, DO;  Location: [facility];  Service: Plastics;  Laterality: Bilateral;   LIPOSUCTION WITH LIPOFILLING Bilateral 06/28/2020   Procedure: Lipofilling bilateral breasts for asymmetry;  Surgeon: Peggye Form, DO;  Location: [facility];  Service: Plastics;  Laterality: Bilateral;  90 min   MASTECTOMY Bilateral 01/09/2016   NIPPLE SPARING MASTECTOMY/SENTINAL LYMPH NODE BIOPSY/RECONSTRUCTION/PLACEMENT OF TISSUE EXPANDER Bilateral 01/09/2016   Procedure: BILATERAL NIPPLE SPARING MASTECTOMY WITH LEFT SENTINAL LYMPH NODE BIOPSY ;  Surgeon: Almond Lint, MD;  Location: [facility];  Service: General;  Laterality: Bilateral;   PHOTOCOAGULATION WITH LASER Left 10/04/2020   Procedure: PHOTOCOAGULATION WITH LASER;  Surgeon: Rennis Chris, MD;  Location: [facility] OR;  Service: Ophthalmology;  Laterality: Left;   REMOVAL OF BILATERAL TISSUE EXPANDERS WITH PLACEMENT OF BILATERAL BREAST IMPLANTS Bilateral 08/20/2016   Procedure: REMOVAL OF BILATERAL TISSUE EXPANDERS WITH PLACEMENT OF BILATERAL SILICONE IMPLANTS;  Surgeon: Peggye Form, DO;  Location: [facility];  Service: Plastics;  Laterality: Bilateral;   REMOVAL OF BILATERAL TISSUE EXPANDERS WITH PLACEMENT OF BILATERAL BREAST IMPLANTS Bilateral 11/05/2017   Procedure: REMOVAL OF BILATERAL TISSUE EXPANDERS WITH PLACEMENT OF BILATERAL BREAST SILICONE IMPLANTS;  Surgeon: Peggye Form, DO;  Location: [facility];  Service:  Plastics;  Laterality: Bilateral;   REMOVAL OF TISSUE EXPANDER Bilateral 02/11/2016   Procedure: REMOVAL OF BILATERAL TISSUE EXPANDERS AND FLEX HD REMOVAL;  Surgeon: Peggye Form, DO;  Location: [facility];  Service: Plastics;  Laterality: Bilateral;   RETINAL DETACHMENT SURGERY Left 10/01/2020   Pneumatic retinopexy for repair of rheg RD - Dr. Rennis Chris   RETINAL DETACHMENT SURGERY Left 10/04/2020   PPV - Dr. Rennis Chris   SCLERAL BUCKLE Left 10/04/2020   Procedure: SCLERAL BUCKLE LEFT EYE;  Surgeon: Rennis Chris, MD;  Location: [facility] OR;  Service: Ophthalmology;  Laterality: Left;   TISSUE EXPANDER PLACEMENT Bilateral 08/12/2017   Procedure: PLACEMENT OF BILATERAL TISSUE EXPANDER;  Surgeon: Peggye Form, DO;  Location: [facility];  Service: Plastics;  Laterality: Bilateral;   TOTAL ABDOMINAL HYSTERECTOMY  05/1996   endometriosis   TOTAL KNEE ARTHROPLASTY Right 03/31/2022   Procedure: RIGHT TOTAL KNEE REPLACEMENT;  Surgeon: Tarry Kos, MD;  Location: [facility] OR;  Service: Orthopedics;  Laterality: Right;   TUBAL LIGATION Bilateral 1991   VITRECTOMY 25 GAUGE WITH SCLERAL BUCKLE Left 10/04/2020   Procedure: 25 GAUGE PARS PLANA VITRECTOMY LEFT EYE ;  Surgeon: Rennis Chris, MD;  Location: [facility] OR;  Service: Ophthalmology;  Laterality: Left;    No Known Allergies  BP 138/88   Ht 5\' 7"  (1.702 m)   Wt 200 lb (90.7 kg)   LMP  (LMP Unknown)   BMI 31.32 kg/m       No data to display              No data to display              Objective:  Physical Exam:  Gen: NAD, comfortable in exam room  Inspection of the right foot reveals some swelling of the lateral aspect of the foot near the cuboid area.  There is no tenderness to palpation over the base of the fifth.  There is some tenderness palpation near the cuboid laterally.  No tenderness over the ATFL.  Inversion eversion testing is normal.  Range of motion is full with plantar and  dorsiflexion.  Ultrasound right foot: Ultrasound of the lateral cuboid does show some cortical irregularities with possible Formation.  This could represent a possible fracture versus previous osteophyte which has fractured as well.  There is some overlying hypoechoic changes of the cortex consistent with some effusion.    Impression: Cortical irregularity over the lateral cuboid consistent with possible fracture of previous osteophyte versus less likely stress fracture. Assessment & Plan:  1. 1. Right foot pain (Primary) -Given patient's ultrasound findings, patient's pain does appear to be coming from a cortical irregularity on the cuboid area of the foot.  Will go ahead and treat this like a fracture at this time.  Will place patient in a cam boot for the next 4 to 6 weeks.  Will have patient follow-up in 4 weeks and rescan at that time to evaluate healing.  Will also put patient on meloxicam for the next 2 weeks.  Patient understanding and agreeable with plan. - [name] LIMITED JOINT SPACE STRUCTURES LOW RIGHT; Future    Brenton Grills MD, PGY-4  Sports Medicine Fellow [facility]

## 2023-11-30 DIAGNOSIS — H5213 Myopia, bilateral: Secondary | ICD-10-CM | POA: Diagnosis not present

## 2023-12-07 ENCOUNTER — Ambulatory Visit: Payer: 59 | Admitting: Family Medicine

## 2023-12-08 ENCOUNTER — Ambulatory Visit: Payer: 59 | Admitting: Plastic Surgery

## 2023-12-09 ENCOUNTER — Ambulatory Visit: Payer: Self-pay

## 2023-12-09 ENCOUNTER — Encounter (HOSPITAL_COMMUNITY): Payer: Self-pay

## 2023-12-09 ENCOUNTER — Other Ambulatory Visit: Payer: Self-pay

## 2023-12-09 ENCOUNTER — Emergency Department (HOSPITAL_COMMUNITY)
Admission: EM | Admit: 2023-12-09 | Discharge: 2023-12-09 | Disposition: A | Discharge status: Home / Self Care | Attending: Emergency Medicine | Admitting: Emergency Medicine

## 2023-12-09 DIAGNOSIS — M545 Low back pain, unspecified: Secondary | ICD-10-CM | POA: Insufficient documentation

## 2023-12-09 DIAGNOSIS — M5459 Other low back pain: Secondary | ICD-10-CM | POA: Diagnosis not present

## 2023-12-09 MED ORDER — METHOCARBAMOL 750 MG PO TABS: 750.0000 mg | ORAL_TABLET | Freq: Three times a day (TID) | ORAL | 0 refills | Status: DC | PRN

## 2023-12-09 MED ORDER — METHYLPREDNISOLONE 4 MG PO TBPK: ORAL_TABLET | ORAL | 0 refills | Status: DC

## 2023-12-09 NOTE — ED Triage Notes (Addendum)
 Pt report L side back pain x3 days.Non radiating. Denies changed to bowel or bladder. Reports no numbness or tingling.

## 2023-12-09 NOTE — Discharge Instructions (Signed)
 1.  You may start your Medrol Dosepak today.  You are starting slightly later in the day but you may decrease the time between your doses and complete your first day today. 2.  You have been given Robaxin to take as a muscle relaxer to help with spasms of pain.  He also add extra strength Tylenol every 6 hours for pain control.  The Medrol Dosepak should be helping in about 12 to 24 hours. 3.  You appear to have low back strain and irritation around the sciatic joint.  Follow-up with your doctor for recheck to make sure that this is improving and resolving.  I suspect this is due to changes in your gait and accommodation for chronic problems with right knee and foot pain. 4.  Return to emergency department if you get any numbness or weakness in your leg, change in bowel or bladder habit, abdominal pain or other concerning changes.

## 2023-12-09 NOTE — Telephone Encounter (Signed)
 Chief Complaint: L flank pain Symptoms: L flank pain, frequency, nausea Frequency: 4 days Pertinent Negatives: Patient denies fever, chills, abd pain, numbness/weakness, dysuria, hematuria Disposition: [x] ED /[] Urgent Care (no appt availability in office) / [] Appointment(In office/virtual)/ []  [place] [facility]/ [] Home Care/ [] Refused Recommended Disposition /[] [place] [facility]/ []  Follow-up with PCP Additional Notes: Pt reports 4 days of L flank pain. Pt thought she had pulled a muscle. Ibuprofen not helping. Pt reports urinary frequency at baseline and also states she takes a BP pill that makes her urinate often. Pt states she may be urinating more frequently than normal today but is not sure. Pt endorses nausea beginning a few hours ago. No fever, chills, abd pain, numbness/weakness. Denies hematuria but states she has not been looking closely at her urine. Denies dysuria. RN advised pt go to the ED to r/o a stone. Pt agreeable, states she will head to [facility] now. RN advised pt she should call 911 should she worsen. Pt verbalized understanding.    Copied from CRM [id number]. Topic: Clinical - Red Word Triage >> Dec 09, 2023  9:15 AM Geroge Baseman wrote: Red Word that prompted transfer to Nurse Triage: Patient having pains in her back on the left side running up her back. Getting worse in pain severity. Reason for Disposition  [1] SEVERE pain (e.g., excruciating, scale 8-10) AND [2] not improved after pain medicine  Answer Assessment - Initial Assessment Questions 1. LOCATION: "Where does it hurt?" (e.g., left, right)     L flank pain 2. ONSET: "When did the pain start?"     4-5 days, pt thought she pulled a muscle 3. SEVERITY: "How bad is the pain?" (e.g., Scale 1-10; mild, moderate, or severe)   - MILD (1-3): doesn't interfere with normal activities    - MODERATE (4-7): interferes with normal activities or awakens from sleep    - SEVERE (8-10): excruciating pain and patient  unable to do normal activities (stays in bed)       "When I move it's an 8 or 9" 4. PATTERN: "Does the pain come and go, or is it constant?"      Pt states it is constant but moving makes it worse  5. CAUSE: "What do you think is causing the pain?"     Not sure - pulled muscles or kidneys 6. OTHER SYMPTOMS:  "Do you have any other symptoms?" (e.g., fever, abdomen pain, vomiting, leg weakness, burning with urination, blood in urine)     Taking ibuprofen but states pain is getting worse. No hematuria (but has also not been looking). No dysuria. Pt states she has a problem with urinary frequency at baseline. Pt states "I am going a lot. It seems like I am going more but I also take a BP pill that helps with fluid." Denies foul urine or cloudy urine. Denies pain that radiates down her leg. "In the last couple hours I am becoming nauseous." Denies numbness and weakness. Denies abd pain. No fever or chills.  Protocols used: Flank Pain-A-AH

## 2023-12-09 NOTE — ED Provider Notes (Signed)
 [name] EMERGENCY DEPARTMENT AT [facility] Provider Note   CSN: [id number] Arrival date & time: 12/09/23  1022     History  Chief Complaint  Patient presents with   Back Pain    Christina [name] is a 58 y.o. female.  HPI Patient reports she has had about 3 to 4 days of lower back pain on the left.  She indicates the area around the top of her SI joint.  She reports it also migrate somewhat toward the midline.  No associated radiation into the leg.  No weakness numbness of the leg.  No associated abdominal pain.  No associated bowel or bladder dysfunction.  Patient denies any injury.  She does stand for very prolonged periods at work.  At rest, she reports the pain is mild rates it a 2 out of 10.  She reports with certain twisting and bending motions the pain becomes very focused and sharp and goes up to about a 8 out of a 10.  Patient has had problems with persistent pain and swelling in her right knee since knee replacement a couple of years ago.  She also suffers from pain in the right foot.  She is currently wearing a walking boot periodically for work when she is going to be doing prolonged standing.  She is wearing this on the right.  She reports she has a stress fracture in the right foot.  Patient reports that the right lower extremity has been chronically swollen since her surgery.  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  She denies any problems with pain behind the right knee or in the calf.  No shortness of breath no chest pain.  Patient reports she has done multiple follow-ups for chronic persistent postoperative pain in the right knee and chronic swelling with right foot pain.  She reports she is consistently told that this is normal healing and since completing physical therapy postoperative has not been recommended other treatments.    Home Medications Prior to Admission medications   Medication Sig Start Date End Date Taking?  Authorizing Provider  methocarbamol (ROBAXIN-750) 750 MG tablet Take 1 tablet (750 mg total) by mouth every 8 (eight) hours as needed for muscle spasms. 12/09/23  Yes Arby Barrette, MD  methylPREDNISolone (MEDROL DOSEPAK) 4 MG TBPK tablet Follow Dosepak instructions for daily dosing 12/09/23  Yes Laruen Risser, Lebron Conners, MD  desvenlafaxine (PRISTIQ) 50 MG 24 hr tablet Take 1 tablet (50 mg total) by mouth daily. 11/30/23   [name], PA  estradiol (ESTRACE) 0.1 MG/GM vaginal cream Place 0.5 g vaginally 2 (two) times a week. Place 0.5g nightly for two weeks then twice a week after 10/15/23   Selmer Dominion, NP  hydrochlorothiazide (HYDRODIURIL) 12.5 MG tablet Take 1 tablet (12.5 mg total) by mouth daily. 09/08/23   [name], PA  hydrOXYzine (ATARAX) 50 MG tablet Take 1/2 to 1 tablet (25 to 50 mg total) every 8 hours as needed for breakthrough anxiety. 03/26/23   [name], PA  meloxicam (MOBIC) 15 MG tablet Take 1 tablet (15 mg total) by mouth daily. Take with food. 11/27/23   Brenton Grills, MD  mirabegron ER (MYRBETRIQ) 25 MG TB24 tablet Take 1 tablet (25 mg total) by mouth daily. Patient not taking: Reported on 11/06/2023 10/13/23   Selmer Dominion, NP  traMADol (ULTRAM) 50 MG tablet Take 1 tablet (50 mg total) by mouth 2 (two) times daily as needed. 05/07/23  Cristie Hem, PA-C  Vibegron (GEMTESA) 75 MG TABS Take 1 tablet (75 mg total) by mouth daily. 11/06/23   Loleta Chance, MD  Vibegron (GEMTESA) 75 MG TABS Take 1 tablet (75 mg total) by mouth daily. 11/06/23   Loleta Chance, MD      Allergies    Patient has no known allergies.    Review of Systems   Review of Systems  Physical Exam Updated Vital Signs BP (!) 156/87 (BP Location: Right Arm)   Pulse 65   Temp 98 F (36.7 C) (Oral)   Resp 16   Ht 5\' 7"  (1.702 m)   Wt 90.7 kg   LMP  (LMP Unknown)   SpO2 100%   BMI 31.32 kg/m  Physical Exam Constitutional:      Comments: Alert nontoxic no acute distress.  HENT:      Mouth/Throat:     Pharynx: Oropharynx is clear.  Eyes:     Extraocular Movements: Extraocular movements intact.  Cardiovascular:     Rate and Rhythm: Normal rate and regular rhythm.  Pulmonary:     Effort: Pulmonary effort is normal.     Breath sounds: Normal breath sounds.  Abdominal:     General: There is no distension.     Palpations: Abdomen is soft.     Tenderness: There is no abdominal tenderness. There is no guarding.  Musculoskeletal:     Comments: Patient indicates pain is at the SI joint on the left at the top of the iliac.  There is no significant reproducible pain at the site.  No rash, no soft tissue changes.  Patient has well-healed surgical scar of the right knee with mild to moderate swelling and no erythema.  Patient is nontender to palpation in the popliteal fossa on the right and no discomfort to compression or palpation of the right lower leg posteriorly.  Left lower extremity calf soft nontender.  Foot warm and dry.  Skin:    General: Skin is warm and dry.  Neurological:     General: No focal deficit present.     Mental Status: She is oriented to person, place, and time.     Motor: No weakness.     Coordination: Coordination normal.  Psychiatric:        Mood and Affect: Mood normal.     ED Results / Procedures / Treatments   Labs (all labs ordered are listed, but only abnormal results are displayed) Labs Reviewed - No data to display  EKG None  Radiology No results found.  Procedures Procedures    Medications Ordered in ED Medications - No data to display  ED Course/ Medical Decision Making/ A&P                                 Medical Decision Making  Presents as outlined.  She had acute onset of lower back pain in the SI joint region.  It is consistently reproduced by certain bending and forward flexion motions.  No neurologic dysfunction associated.  No associated GI or GU symptoms.  At this time consistent with back pain and spasm around the  left SI joint.  I suspect this is due to patient's gait and standing mechanics given chronic pain and swelling with stress fracture of the right foot.  These are not new or acute problems.  Patient has done many follow-ups and evaluations for the right knee and foot.  He had distant history of a DVT on the right.  She does not Dors any symptoms of DVT today.  She does not have any pain in the popliteal fossa or the calf.  She does not present seeking treatment for this problem.  We discussed possible options of additional physical therapy or going orthopedic treatment and management.  Patient voices understanding.  Will treat acute low back pain with Medrol Dosepak and Robaxin and exercise Tylenol as needed.        Final Clinical Impression(s) / ED Diagnoses Final diagnoses:  Acute left-sided low back pain without sciatica    Rx / DC Orders ED Discharge Orders          Ordered    methylPREDNISolone (MEDROL DOSEPAK) 4 MG TBPK tablet        12/09/23 1105    methocarbamol (ROBAXIN-750) 750 MG tablet  Every 8 hours PRN        12/09/23 1105              Arby Barrette, MD 12/09/23 1114

## 2023-12-09 NOTE — Telephone Encounter (Signed)
 Copied From CRM [id number]. Reason for Triage: patient calling back to tell nurse that she went to the ER and they gave her muscle relax please call patient back if any questions [phone]  Received message from patient; no questions for patient; message forwarded to PCP office.

## 2023-12-09 NOTE — Telephone Encounter (Signed)
 Forwarded to PCP office. Patient has already been triaged and treated at ED. Copied from CRM [id number]. Topic: Clinical - Pink Word Triage >> Dec 09, 2023 11:30 AM Louie Casa B wrote: Reason for Triage: patient calling back to tell nurse that she went to the ER and they gave her muscle relax please call patient back if any questions [phone]

## 2023-12-15 ENCOUNTER — Telehealth: Payer: Self-pay

## 2023-12-15 NOTE — Telephone Encounter (Signed)
 Called and confirmed with patient appointment time. No current symptoms of a UTI. No need for antibiotics before procedure.

## 2023-12-17 ENCOUNTER — Ambulatory Visit (INDEPENDENT_AMBULATORY_CARE_PROVIDER_SITE_OTHER): Admitting: Obstetrics

## 2023-12-17 ENCOUNTER — Other Ambulatory Visit (HOSPITAL_COMMUNITY)
Admission: RE | Admit: 2023-12-17 | Discharge: 2023-12-17 | Disposition: A | Discharge status: Home / Self Care | Source: Other Acute Inpatient Hospital | Attending: Obstetrics | Admitting: Obstetrics

## 2023-12-17 ENCOUNTER — Encounter: Payer: Self-pay | Admitting: Obstetrics

## 2023-12-17 VITALS — BP 127/82 | HR 70

## 2023-12-17 DIAGNOSIS — R9341 Abnormal radiologic findings on diagnostic imaging of renal pelvis, ureter, or bladder: Secondary | ICD-10-CM | POA: Diagnosis not present

## 2023-12-17 DIAGNOSIS — N3946 Mixed incontinence: Secondary | ICD-10-CM | POA: Diagnosis not present

## 2023-12-17 DIAGNOSIS — N329 Bladder disorder, unspecified: Secondary | ICD-10-CM | POA: Insufficient documentation

## 2023-12-17 DIAGNOSIS — R350 Frequency of micturition: Secondary | ICD-10-CM | POA: Diagnosis not present

## 2023-12-17 DIAGNOSIS — N819 Female genital prolapse, unspecified: Secondary | ICD-10-CM

## 2023-12-17 LAB — POCT URINALYSIS DIPSTICK
Bilirubin, UA: NEGATIVE
Glucose, UA: NEGATIVE
Ketones, UA: NEGATIVE
Leukocytes, UA: NEGATIVE
Nitrite, UA: NEGATIVE
Protein, UA: NEGATIVE
Spec Grav, UA: 1.025 (ref 1.010–1.025)
Urobilinogen, UA: 0.2 U/dL
pH, UA: 5.5 (ref 5.0–8.0)

## 2023-12-17 NOTE — Patient Instructions (Signed)
Taking Care of Yourself after Cystoscopy   Drink plenty of water for a day or two following your procedure. Try to have about 8 ounces (one cup) at a time, and do this 6 times or more per day unless you have fluid restrictitons AVOID irritative beverages such as coffee, tea, soda, alcoholic or citrus drinks for a day or two, as this may cause burning with urination.  For the first 1-2 days after the procedure, your urine may be pink or red in color. You may have some blood in your urine as a normal side effect of the procedure. Large amounts of bleeding or difficulty urinating are NOT normal. Call the nurse line if this happens or go to the nearest Emergency Room if the bleeding is heavy or you cannot urinate at all and it is after hours. If you had a Bulkamid injection in the urethra and need to be catheterized, ask for a pediatric catheter to be used (size 10 or 12-French) so the material is not pushed out of place.   You may experience some discomfort or a burning sensation with urination after having this procedure. You can use over the counter Azo or pyridium to help with burning and follow the instructions on the packaging. If it does not improve within 1-2 days, or other symptoms appear (fever, chills, or difficulty urinating) call the office to speak to a nurse.  You may return to normal daily activities such as work, school, driving, exercising and housework on the day of the procedure. If your doctor gave you a prescription, take it as ordered.

## 2023-12-17 NOTE — Progress Notes (Signed)
 Lab Results  Component Value Date   COLORU Yellow 12/17/2023   CLARITYU Clear 12/17/2023   GLUCOSEUR Negative 12/17/2023   BILIRUBINUR Negative 12/17/2023   KETONESU Negative 12/17/2023   SPECGRAV 1.025 12/17/2023   RBCUR Moderate 12/17/2023   PHUR 5.5 12/17/2023   PROTEINUR Negative 12/17/2023   UROBILINOGEN 0.2 12/17/2023   LEUKOCYTESUR Negative 12/17/2023

## 2023-12-17 NOTE — Progress Notes (Addendum)
 CYSTOSCOPY  CC:  This is a 58 y.o. with mixed urinary incontinence with a history of pelvic surgery who presents today for cystoscopy.  Reports left sided lower back back started around 2 weeks ago. Denies fever, N/V, dysuria, hematuria.  Presented to ED for evaluation, was given muscle relaxants   Lab Results  Component Value Date   COLORU Yellow 12/17/2023   CLARITYU Clear 12/17/2023   GLUCOSEUR Negative 12/17/2023   BILIRUBINUR Negative 12/17/2023   KETONESU Negative 12/17/2023   SPECGRAV 1.025 12/17/2023   RBCUR Moderate 12/17/2023   PHUR 5.5 12/17/2023   PROTEINUR Negative 12/17/2023   UROBILINOGEN 0.2 12/17/2023   LEUKOCYTESUR Negative 12/17/2023   BP 127/82   Pulse 70   LMP  (LMP Unknown)   CYSTOSCOPY: A time out was performed.  The periurethral area was prepped and draped in a sterile manner.  2% lidocaine  jetpack was inserted at the urethral meatus and the urethra and bladder visualized with 12- and 70-degree scopes.  She had normal urethral coaptation and normal urethral mucosa.  She had abnormal  generalized well circumscribed red 2-37mm circular lesion on bladder mucosa at anterior and posterior bladder wall, trigone and bladder dome. She had bilateral clear efflux from both ureteral orifices.  She had no squamous metaplasia at the trigone, no trabeculations, cellules or diverticuli.     ASSESSMENT:  58 y.o. with mixed urinary incontinence with a history of pelvic surgery. 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  PLAN:  Follow-up to discuss findings and treatment options.  Discussed follow-up to review bladder biopsy and patient desires to proceed with surgical intervention for prolapse and incontinence.  All questions answered and post-procedures instructions were given  Darlene Ehlers, MD

## 2023-12-19 DIAGNOSIS — Z419 Encounter for procedure for purposes other than remedying health state, unspecified: Secondary | ICD-10-CM | POA: Diagnosis not present

## 2023-12-19 LAB — URINE CULTURE: Culture: 60000 — AB

## 2023-12-21 DIAGNOSIS — G4733 Obstructive sleep apnea (adult) (pediatric): Secondary | ICD-10-CM | POA: Diagnosis not present

## 2023-12-22 MED ORDER — SULFAMETHOXAZOLE-TRIMETHOPRIM 800-160 MG PO TABS: 1.0000 | ORAL_TABLET | Freq: Two times a day (BID) | ORAL | 0 refills | Status: AC

## 2023-12-22 NOTE — Addendum Note (Signed)
 Addended byWyonia Hefty T on: 12/22/2023 01:59 PM   Modules accepted: Orders

## 2023-12-31 ENCOUNTER — Other Ambulatory Visit: Admitting: Obstetrics

## 2023-12-31 ENCOUNTER — Other Ambulatory Visit: Payer: 59 | Admitting: Obstetrics

## 2024-01-04 ENCOUNTER — Telehealth: Payer: Self-pay

## 2024-01-04 DIAGNOSIS — C50912 Malignant neoplasm of unspecified site of left female breast: Secondary | ICD-10-CM | POA: Diagnosis not present

## 2024-01-04 DIAGNOSIS — Z9011 Acquired absence of right breast and nipple: Secondary | ICD-10-CM | POA: Diagnosis not present

## 2024-01-04 NOTE — Telephone Encounter (Signed)
 Left breast is swollen and noticeable by the fitter during her bra sizing.  The concern in the implant has possibly ruptured and to see if she needs to come in any sooner.  Patient verbalized she is not in any pain just the swelling in the left breast.

## 2024-01-05 NOTE — Telephone Encounter (Signed)
 Attempted to call patient. Left a VM. Recommend that she keep her appointment with Dr. Orin Birk next week, but if she calls back I would like to speak with her further. Thank you

## 2024-01-05 NOTE — Telephone Encounter (Signed)
 Called and spoke with patient. She states that she has not seen or felt and recent changes to her left implant placement site. She states that she had an infection on the right side shortly after her initial reconstruction and as a result has always been a bit smaller on that side. When she went to [facility] they noticed that the left was a bit larger than the right and expressed concern for possible implant rupture. However, patient reports that it could be that she has always had a bit more fatty tissue in the axilla on that side extending posteriorly towards the scapula.    Discussed possible US  to assess for implant rupture, but patient tells me that she is entirely asymptomatic and would instead prefer to wait until she sees Dr. Orin Birk next week, as scheduled.

## 2024-01-12 ENCOUNTER — Other Ambulatory Visit

## 2024-01-15 ENCOUNTER — Other Ambulatory Visit

## 2024-01-15 ENCOUNTER — Encounter: Payer: Self-pay | Admitting: Plastic Surgery

## 2024-01-15 ENCOUNTER — Ambulatory Visit (INDEPENDENT_AMBULATORY_CARE_PROVIDER_SITE_OTHER): Admitting: Plastic Surgery

## 2024-01-15 VITALS — BP 156/81 | HR 61 | Ht 67.0 in | Wt 221.8 lb

## 2024-01-15 DIAGNOSIS — N651 Disproportion of reconstructed breast: Secondary | ICD-10-CM

## 2024-01-15 DIAGNOSIS — Z9013 Acquired absence of bilateral breasts and nipples: Secondary | ICD-10-CM | POA: Diagnosis not present

## 2024-01-15 DIAGNOSIS — Z6831 Body mass index (BMI) 31.0-31.9, adult: Secondary | ICD-10-CM

## 2024-01-15 DIAGNOSIS — R222 Localized swelling, mass and lump, trunk: Secondary | ICD-10-CM | POA: Diagnosis not present

## 2024-01-15 NOTE — Addendum Note (Signed)
 Addended by: Ali Antonio on: 01/15/2024 03:44 PM   Modules accepted: Orders

## 2024-01-15 NOTE — Progress Notes (Signed)
   Subjective:    Patient ID: Christina [name], female    DOB: 1966/04/16, 58 y.o.   MRN: [id number]  The patient is a 58 year old female here for evaluation of her breast.  She is here for her 1 year follow-up.  She was diagnosed with breast cancer in 2017 after finding calcifications of the left breast.  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  The patient opted for bilateral mastectomies and implant reconstruction.  She had Mentor smooth high-profile gel 455 cc implants placed in December 2017.  She then had them removed and replaced in February 2019 and now has 590 cc implants in place.  And then in October 2021 she had fat grafting.  She underwent eye surgery and was also treated for a DVT.  She has gained some weight over the past year due to quite a bit of family stress.  She had tried some weight loss medications that were working really well in the past.  She had to come off of them due to insurance.  She is interested in weight reduction and I think this would help overall.  I do not see or feel any areas of concern in her breasts.  The area on the back is about 8 cm in size.  It is a little bit firm as well.      Review of Systems  Constitutional: Negative.   HENT: Negative.    Eyes: Negative.   Respiratory: Negative.    Cardiovascular: Negative.   Gastrointestinal: Negative.   Endocrine: Negative.   Genitourinary: Negative.   Musculoskeletal: Negative.        Objective:   Physical Exam Constitutional:      Appearance: Normal appearance.  Cardiovascular:     Rate and Rhythm: Normal rate.     Pulses: Normal pulses.  Pulmonary:     Effort: Pulmonary effort is normal.  Abdominal:     Palpations: Abdomen is soft.  Skin:    General: Skin is warm.     Capillary Refill: Capillary refill takes less than 2 seconds.  Neurological:     Mental Status: She is alert and oriented to person, place, and time.  Psychiatric:        Mood and Affect: Mood  normal.        Behavior: Behavior normal.        Thought Content: Thought content normal.        Judgment: Judgment normal.           Assessment & Plan:     ICD-10-CM   1. Acquired absence of both breasts  Z90.13     2. Breast asymmetry following reconstructive surgery  N65.1     3. Mass of skin of back  R22.2       I would like to look into this further to evaluate this back asymmetry and a possible mass on the left lateral shoulder area.  With her diagnosis I do not want to ignore this.  The patient is in agreement and would like to look into this.  Will plan on an ultrasound and then we will plan to talk in about a week.  Pictures were obtained of the patient and placed in the chart with the patient's or guardian's permission.

## 2024-01-18 ENCOUNTER — Encounter (INDEPENDENT_AMBULATORY_CARE_PROVIDER_SITE_OTHER): Payer: Self-pay

## 2024-01-18 DIAGNOSIS — Z419 Encounter for procedure for purposes other than remedying health state, unspecified: Secondary | ICD-10-CM | POA: Diagnosis not present

## 2024-01-20 ENCOUNTER — Encounter: Payer: Self-pay | Admitting: Gastroenterology

## 2024-01-20 ENCOUNTER — Ambulatory Visit (INDEPENDENT_AMBULATORY_CARE_PROVIDER_SITE_OTHER): Admitting: Gastroenterology

## 2024-01-20 ENCOUNTER — Other Ambulatory Visit (INDEPENDENT_AMBULATORY_CARE_PROVIDER_SITE_OTHER)

## 2024-01-20 DIAGNOSIS — Z01818 Encounter for other preprocedural examination: Secondary | ICD-10-CM | POA: Diagnosis not present

## 2024-01-20 DIAGNOSIS — E119 Type 2 diabetes mellitus without complications: Secondary | ICD-10-CM | POA: Diagnosis not present

## 2024-01-20 DIAGNOSIS — Z1509 Genetic susceptibility to other malignant neoplasm: Secondary | ICD-10-CM

## 2024-01-20 DIAGNOSIS — Z860100 Personal history of colon polyps, unspecified: Secondary | ICD-10-CM

## 2024-01-20 LAB — HEMOGLOBIN A1C: Hgb A1c MFr Bld: 7 % — ABNORMAL HIGH (ref 4.6–6.5)

## 2024-01-20 NOTE — Progress Notes (Addendum)
 Chief Complaint: Pancreatic Cancer screening, ATM gene mutation,   Referring Provider:     Marijo Shove [facility])    HPI:     Christina [name] is a 58 y.o. 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  Due to updated guidelines, she was contacted by the [facility] and referred to the GI clinic for consideration of Pancreatic cancer screening due to ATM gene mutation.  She is otherwise without any GI symptoms.  No change in bowel habits, abdominal pain, change in p.o. intake, fever, nausea/vomiting.  Last abdominal imaging was CT abdomen/pelvis in 03/2020 which showed a normal-appearing pancreas, liver, and no duct dilation.  Reviewed labs from 08/2023: Normal CBC.  ALT 61, BG 128, otherwise normal CMP.  A1c 6.5% (stable from previous).   Not currently on any medications for diabetes.   Maternal grandmother with colon cancer. Otherwise, no known family history of liver disease, pancreatic disease, or IBD.   Last colonoscopy was about 3 years ago with Dr. Tova Fresh and notable for polyps per patient. Due for repeat in 06/2025. No prior EGD.     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,  multifocal IDC, DCIS, ER/PR+,  01-09-2016 s/p bilteral mastectomies w/ left sln bx;  no chemoradiation   Cataracts, bilateral    Depression    Diabetes mellitus without complication (HCC)    type 2   GAD (generalized anxiety disorder)    Gallbladder problem    History of ovarian cyst    IBS (irritable bowel syndrome)    Joint pain    OSA (obstructive sleep  apnea) 09/08/2023   PONV (postoperative nausea and vomiting)    does well with scop patch   Retinal detachment    Rheg OS   Right knee meniscal tear    Urgency of urination      Past Surgical History:  Procedure Laterality Date   ACHILLES TENDON REPAIR Right 2010;  revision 2011   BILATERAL TOTAL MASTECTOMY WITH AXILLARY LYMPH NODE DISSECTION     BLADDER SUSPENSION  2000   BREAST BIOPSY Left 10/2015   BREAST IMPLANT REMOVAL Bilateral 08/12/2017   Procedure: REMOVAL BILATERAL BREAST IMPLANTS;  Surgeon: Thornell Flirt, DO;  Location: [facility];  Service: Plastics;  Laterality: Bilateral;   BREAST RECONSTRUCTION WITH PLACEMENT OF TISSUE EXPANDER AND FLEX HD (ACELLULAR HYDRATED DERMIS) Bilateral 01/09/2016   BREAST RECONSTRUCTION WITH PLACEMENT OF TISSUE EXPANDER AND FLEX HD (ACELLULAR HYDRATED DERMIS) Bilateral 01/09/2016   Procedure: BREAST RECONSTRUCTION WITH PLACEMENT OF TISSUE EXPANDER AND FLEX HD (ACELLULAR HYDRATED DERMIS);  Surgeon: Lindaann Requena Dillingham, DO;  Location: [facility];  Service: Plastics;  Laterality: Bilateral;   BREAST RECONSTRUCTION WITH PLACEMENT OF TISSUE EXPANDER AND FLEX HD (ACELLULAR HYDRATED DERMIS) Bilateral 05/29/2016   Procedure: PLACEMENT OF BILATERAL TISSUE EXPANDER AND FLEX HD (ACELLULAR HYDRATED DERMIS);  Surgeon: Thornell Flirt, DO;  Location: [facility];  Service: Plastics;  Laterality: Bilateral;   BREAST REDUCTION SURGERY Bilateral 11/26/2016   Procedure: BILATERAL BREAST CAPSULE CONTRACTURE RELASE;  Surgeon: Thornell Flirt, DO;  Location: [facility];  Service: Plastics;  Laterality: Bilateral;   CESAREAN SECTION  1987; 1989; 1991   ELBOW SURGERY Right x3   last one 02-01-2019 @ [facility]   EYE SURGERY Left 10/01/2020   Pneumatic retinopexy for repair of rheg RD - Dr. Ronelle Coffee   EYE SURGERY Left 10/04/2020   PPV - Dr. Ronelle Coffee   FAT GRAFTING BILATERAL BREAST  08-09-2018  @[facility]    GAS  INSERTION Left 10/04/2020   Procedure: INSERTION OF GAS;  Surgeon: Ronelle Coffee, MD;  Location: [facility] OR;  Service: Ophthalmology;  Laterality: Left;   GAS/FLUID EXCHANGE Left 10/04/2020   Procedure: GAS/FLUID EXCHANGE;  Surgeon: Ronelle Coffee, MD;  Location: [facility] OR;  Service: Ophthalmology;  Laterality: Left;   INCISION AND DRAINAGE OF WOUND Bilateral 02/11/2016   Procedure: IRRIGATION AND DEBRIDEMENT OF BILATERAL BREAST POCKET;  Surgeon: Thornell Flirt, DO;  Location: [facility];  Service: Plastics;  Laterality: Bilateral;   KNEE ARTHROSCOPY WITH MEDIAL MENISECTOMY Right 12/20/2019   Procedure: KNEE ARTHROSCOPY WITH MEDIAL MENISECTOMY;  Surgeon: Osa Blase, MD;  Location: [facility];  Service: Orthopedics;  Laterality: Right;   KNEE ARTHROSCOPY WITH MEDIAL MENISECTOMY Right 10/30/2021   Procedure: RIGHT KNEE ARTHROSCOPY WITH PARTIAL MEDIAL MENISECTOMY SYNOVECTOMY;  Surgeon: Wes Hamman, MD;  Location: [facility];  Service: Orthopedics;  Laterality: Right;   LAPAROSCOPIC APPENDECTOMY  04-07-2011   @[facility]    w/ Excision peritoneal lipoma and lysis adhesions   LAPAROSCOPIC CHOLECYSTECTOMY  ~ 1999   LASER PHOTO ABLATION Right 10/04/2020   Procedure: LASER RETINOPEXY WITH INDIRECT LASER OPTHALMOSCOPE, RIGHT EYE;  Surgeon: Ronelle Coffee, MD;  Location: [facility] OR;  Service: Ophthalmology;  Laterality: Right;   LIPOSUCTION WITH LIPOFILLING Bilateral 11/26/2016   Procedure: LIPOFILLING FOR SYMMETRY;  Surgeon: Thornell Flirt, DO;  Location: [facility];  Service: Plastics;  Laterality: Bilateral;   LIPOSUCTION WITH LIPOFILLING Bilateral 01/21/2017   Procedure: BILATERAL BREAST  LIPOFILLING FOR ASYMMETRY;  Surgeon: Thornell Flirt, DO;  Location: [facility];  Service: Plastics;  Laterality: Bilateral;   LIPOSUCTION WITH LIPOFILLING Bilateral 06/28/2020   Procedure: Lipofilling bilateral breasts for asymmetry;  Surgeon:  Thornell Flirt, DO;  Location: [facility];  Service: Plastics;  Laterality: Bilateral;  90 min   MASTECTOMY Bilateral 01/09/2016   NIPPLE SPARING MASTECTOMY/SENTINAL LYMPH NODE BIOPSY/RECONSTRUCTION/PLACEMENT OF TISSUE EXPANDER Bilateral 01/09/2016   Procedure: BILATERAL NIPPLE SPARING MASTECTOMY WITH LEFT SENTINAL LYMPH NODE BIOPSY ;  Surgeon: Lockie Rima, MD;  Location[facility];  Service: General;  Laterality: Bilateral;   PHOTOCOAGULATION WITH LASER Left 10/04/2020   Procedure: PHOTOCOAGULATION WITH LASER;  Surgeon: Ronelle Coffee, MD;  Location: [name] OR;  Service: Ophthalmology;  Laterality: Left;   REMOVAL OF BILATERAL TISSUE EXPANDERS WITH PLACEMENT OF BILATERAL BREAST IMPLANTS Bilateral 08/20/2016   Procedure: REMOVAL OF BILATERAL TISSUE EXPANDERS WITH PLACEMENT OF BILATERAL SILICONE IMPLANTS;  Surgeon: Thornell Flirt, DO;  Location: [facility];  Service: Plastics;  Laterality: Bilateral;   REMOVAL OF BILATERAL TISSUE EXPANDERS WITH PLACEMENT OF BILATERAL BREAST IMPLANTS Bilateral 11/05/2017   Procedure: REMOVAL OF BILATERAL TISSUE EXPANDERS WITH PLACEMENT OF BILATERAL BREAST SILICONE IMPLANTS;  Surgeon: Thornell Flirt, DO;  Location: [facility];  Service: Plastics;  Laterality: Bilateral;   REMOVAL OF TISSUE EXPANDER Bilateral 02/11/2016   Procedure: REMOVAL OF BILATERAL TISSUE EXPANDERS AND FLEX HD REMOVAL;  Surgeon: Lindaann Requena Dillingham, DO;  Location: [facility];  Service: Plastics;  Laterality: Bilateral;   RETINAL DETACHMENT SURGERY Left 10/01/2020   Pneumatic retinopexy for repair of rheg RD - Dr. Ronelle Coffee   RETINAL DETACHMENT SURGERY Left 10/04/2020   PPV - Dr. Ronelle Coffee   SCLERAL BUCKLE Left 10/04/2020   Procedure: SCLERAL BUCKLE LEFT EYE;  Surgeon: Ronelle Coffee, MD;  Location: [facility] OR;  Service: Ophthalmology;  Laterality: Left;   TISSUE EXPANDER PLACEMENT Bilateral 08/12/2017   Procedure: PLACEMENT OF  BILATERAL TISSUE EXPANDER;  Surgeon: Thornell Flirt, DO;  Location:  SURGERY CENTER;  Service: Plastics;  Laterality: Bilateral;   TOTAL ABDOMINAL HYSTERECTOMY  05/1996   endometriosis   TOTAL KNEE ARTHROPLASTY Right 03/31/2022   Procedure: RIGHT TOTAL KNEE REPLACEMENT;  Surgeon: Wes Hamman, MD;  [facility];  Service: Orthopedics;  Laterality: Right;   TUBAL LIGATION Bilateral 1991   VITRECTOMY 25 GAUGE WITH SCLERAL BUCKLE Left 10/04/2020   Procedure: 25 GAUGE PARS PLANA VITRECTOMY LEFT EYE ;  Surgeon: Ronelle Coffee, MD;  Location: [facility] OR;  Service: Ophthalmology;  Laterality: Left;   Family History  Problem Relation Age of Onset   Colon polyps Mother        approx 2   Other Mother        hx HPV and hysterectomy due to precancerous cells   Depression Mother    Anxiety disorder Mother    Heart failure Father    Prostate cancer Father 54   Retinal detachment Father    Allergic rhinitis Sister    Other Sister 70       hx of hysterectomy for unspecified reason; still has ovaries   Other Sister 78       paternal half-sister hx of hysterectomy for unspecified reason; still has ovaries   Kidney failure Maternal Grandmother    Congestive Heart Failure Maternal Grandmother    Colon cancer Maternal Grandmother 27   Diabetes Maternal Grandmother    Lung cancer Maternal Grandfather 51       smoker   Breast cancer Paternal Grandmother        dx. early 18s; w/ hx of trauma to breast   Crohn's disease Daughter    Bladder Cancer Maternal Uncle 21       not a smoker   Lung cancer Maternal Uncle 37       smoker   Liver disease Neg Hx    Esophageal cancer Neg Hx    Social History   Tobacco Use   Smoking status: Former    Current packs/day: 0.00    Average packs/day: 1 pack/day for 10.0 years (10.0 ttl pk-yrs)    Types: Cigarettes    Start date: 06/08/1998    Quit date: 06/08/2008    Years since quitting: 15.6    Passive exposure: Never   Smokeless tobacco: Never   Vaping Use   Vaping status: Never Used  Substance Use Topics   Alcohol use: No   Drug use: Never   Current Outpatient Medications  Medication Sig Dispense Refill   desvenlafaxine  (PRISTIQ ) 50 MG 24 hr tablet Take 1 tablet (50 mg total) by mouth daily. 90 tablet 1   hydrochlorothiazide  (HYDRODIURIL ) 12.5 MG tablet Take 1 tablet (12.5 mg total) by mouth daily. 90 tablet 1   hydrOXYzine  (ATARAX ) 50 MG tablet Take 1/2 to 1 tablet (25 to 50 mg total) every 8 hours as needed for breakthrough anxiety. 30 tablet 1   estradiol  (ESTRACE ) 0.1 MG/GM vaginal cream  Place 0.5 g vaginally 2 (two) times a week. Place 0.5g nightly for two weeks then twice a week after (Patient not taking: Reported on 01/20/2024) 42.5 g 11   meloxicam  (MOBIC ) 15 MG tablet Take 1 tablet (15 mg total) by mouth daily. Take with food. (Patient not taking: Reported on 01/20/2024) 14 tablet 0   methocarbamol  (ROBAXIN -750) 750 MG tablet Take 1 tablet (750 mg total) by mouth every 8 (eight) hours as needed for muscle spasms. (Patient not taking: Reported on 01/20/2024) 20 tablet 0   methylPREDNISolone  (MEDROL  DOSEPAK) 4 MG TBPK tablet Follow Dosepak instructions for daily dosing (Patient not taking: Reported on 01/20/2024) 21 tablet 0   mirabegron  ER (MYRBETRIQ ) 25 MG TB24 tablet Take 1 tablet (25 mg total) by mouth daily. (Patient not taking: Reported on 01/15/2024) 30 tablet 5   traMADol  (ULTRAM ) 50 MG tablet Take 1 tablet (50 mg total) by mouth 2 (two) times daily as needed. (Patient not taking: Reported on 01/20/2024) 30 tablet 2   No current facility-administered medications for this visit.   No Known Allergies   Review of Systems: All systems reviewed and negative except where noted in HPI.     Physical Exam:    Wt Readings from Last 3 Encounters:  01/20/24 219 lb (99.3 kg)  01/15/24 221 lb 12.8 oz (100.6 kg)  12/09/23 199 lb 15.3 oz (90.7 kg)    BP (!) 140/80   Pulse 61   Ht 5\' 7"  (1.702 m)   Wt 219 lb (99.3 kg)   LMP   (LMP Unknown)   BMI 34.30 kg/m  Constitutional:  Pleasant, in no acute distress. Psychiatric: Normal mood and affect. Behavior is normal. Neurological: Alert and oriented to person place and time. Skin: Skin is warm and dry. No rashes noted.   ASSESSMENT AND PLAN;   1) ATM gene mutation 2) Increased risk Pancreatic Cancer screening From most recent International CAPS Consortium, ASGE, and AGA guidelines, screening for pancreatic cancer recommended for select high risk individuals.  This patient meets the high risk screening cohort based on the following: -Per most recent guidelines, patients with personal history of pathogenic ATM, BRCA1, BRCA2, or PALB2 mutation should undergo Pancreatic Cancer screening regardless of family history  Recommended age to start surveillance varies by gene mutation status and family history, as follows: -ATM-start at age 36 or 58, or 10 years younger than the youngest affected blood relative  Screening techniques include the following: -Baseline: MRI/MRCP plus EUS plus fasting blood glucose and/or hemoglobin A1c  -Follow-up/surveillance phase: Alternate MRI/MRCP and EUS plus routine fasting blood glucose and/or hemoglobin A1c -If concerning features on imaging, check CA 19-9 -EUS with FNA for solid lesions >=5 mm, cystic lesions with worrisome features, or asymptomatic main pancreatic duct (MPD) strictures (with or without mass) -CT only for solid lesions, regardless of size, or asymptomatic MPD strictures of unknown etiology (without mass)  -Screening interval every 12 months in patients with no abnormalities/nonconcerning abnormalities -Screening interval every 3-6 months in patients with abnormalities that are NOT suspicious for malignancy, but are concerning -EUS evaluation should be performed within 3-6 months for indeterminate lesions (abnormalities that are NOT suspicious for malignancy, but are concerning) -EUS evaluation should be performed within  3 months for high-risk lesions, if surgical resection is not planned. -Immediate surgical referral for abnormality suspicious for malignancy  -New-onset diabetes in a high-risk individual should lead to additional diagnostic studies or change in surveillance interval  -Genetic testing and counseling should be considered for familial  pancreas cancer relatives who are eligible for surveillance. A positive germline mutation is associated with an increased risk of neoplastic progression and may also lead to screening for other relevant associated cancers -Participation in a registry or referral to a pancreas Center of Excellence should be pursued when possible for high-risk patients undergoing pancreas cancer screening -The target detectable pancreatic neoplasms are resectable stage I pancreatic ductal adenocarcinoma and high-risk precursor neoplasms, such as intraductal papillary mucinous neoplasms with high-grade dysplasia and some enlarged pancreatic intraepithelial neoplasias  -We discussed the limitations and potential risks of pancreas cancer screening prior to initiating any screening program, and the patient  wishes to proceed with screening. Plan for the following:  - MRI/MRCP now  - Hgb A1c now (patient does have a known history of diabetes and not currently on any diabetic medications) - EUS in 6 months - RTC every 6 months - If index MRI/MRCP and EUS are unrevealing/normal, per protocol, will liberalize to annual screening, alternating between MRI/MRCP and EUS  3) History of colon polyps: - Per patient, last colonoscopy notable for polyps but also incomplete bowel preparation - Plan for Sutabs when preparing for colonoscopy per patient request -Patient would like to transfer all of her GI care here.  Will request records from Dr. Tova Fresh  I spent 60 minutes of time, including in depth chart review, independent review of results as outlined above, communicating results with the patient  directly, face-to-face time with the patient, coordinating care, ordering studies and medications as appropriate, and documentation.       Annis Kinder, DO, FACG  01/20/2024, 2:08 PM   Noreene Bearded, PA  Addendum: Received prior colonoscopy report from Dr. Tova Fresh which was notable for the following: - 07/18/2019: Colonoscopy for CRC screening: 7 mm distal transverse colon polyp removed with hot snare (path: Benign colonic mucosa), lots of residual stool in the colon despite aggressive lavage and recommended repeat in 5 years with extended bowel prep.  Based on these results, recommend repeat colonoscopy this year due to previous fair prep and for ongoing surveillance.  Plan for Sutab's along with extended 2-day bowel preparation.  Harry Lindau, DO, [facility]

## 2024-01-20 NOTE — Patient Instructions (Signed)
 _______________________________________________________  If your blood pressure at your visit was 140/90 or greater, please contact your primary care physician to follow up on this.  If you are age 56 or younger, your body mass index should be between 19-25. Your Body mass index is 34.3 kg/m. If this is out of the aformentioned range listed, please consider follow up with your Primary Care Provider.  ________________________________________________________  The [facility] providers would like to encourage you to use MYCHART to communicate with providers for non-urgent requests or questions.  Due to long hold times on the telephone, sending your provider a message by [facility] may be a faster and more efficient way to get a response.  Please allow 48 business hours for a response.  Please remember that this is for non-urgent requests.  _______________________________________________________  Your provider has requested that you go to the basement level for lab work before leaving today. Press "B" on the elevator. The lab is located at the first door on the left as you exit the elevator.  You have been scheduled for an MRI/MCRP at [facility] on 01-26-24. Your appointment time is 9am. Please arrive to admitting (at main entrance of the hospital) 30 minutes prior to your appointment time for registration purposes. Please make certain not to have anything to eat or drink 4 hours prior to your test. In addition, if you have any metal in your body, have a pacemaker or defibrillator, please be sure to let your ordering physician know. This test typically takes 45 minutes to 1 hour to complete. Should you need to reschedule, please call [phone] to do so.  Due to recent changes in healthcare laws, you may see the results of your imaging and laboratory studies on MyChart before your provider has had a chance to review them.  We understand that in some cases there may be results that are confusing or  concerning to you. Not all laboratory results come back in the same time frame and the provider may be waiting for multiple results in order to interpret others.  Please give us  48 hours in order for your provider to thoroughly review all the results before contacting the office for clarification of your results.   It was a pleasure to see you today!  Vito Cirigliano, D.O.

## 2024-01-21 ENCOUNTER — Ambulatory Visit: Payer: Self-pay | Admitting: Gastroenterology

## 2024-01-21 ENCOUNTER — Ambulatory Visit (HOSPITAL_BASED_OUTPATIENT_CLINIC_OR_DEPARTMENT_OTHER)
Admission: RE | Admit: 2024-01-21 | Discharge: 2024-01-21 | Disposition: A | Discharge status: Home / Self Care | Source: Ambulatory Visit | Attending: Plastic Surgery | Admitting: Plastic Surgery

## 2024-01-21 DIAGNOSIS — N651 Disproportion of reconstructed breast: Secondary | ICD-10-CM | POA: Insufficient documentation

## 2024-01-21 DIAGNOSIS — Z9013 Acquired absence of bilateral breasts and nipples: Secondary | ICD-10-CM | POA: Diagnosis not present

## 2024-01-21 DIAGNOSIS — R222 Localized swelling, mass and lump, trunk: Secondary | ICD-10-CM | POA: Insufficient documentation

## 2024-01-22 ENCOUNTER — Telehealth: Payer: Self-pay | Admitting: Gastroenterology

## 2024-01-22 NOTE — Telephone Encounter (Signed)
 Spoke with NIA for [facility] and was informed the clinicals for MRI were received but the case is still pending review, they did not want to give me a turnaround time for approval. Will MRI need to be rescheduled?

## 2024-01-25 NOTE — Telephone Encounter (Signed)
 Insurance has not approved MRI/MRCP for 01-26-24.  Spoke with Collette who canceled the test.   Left message on patient voicemail that once prior Siegfried Dress has been received, she can be rescheduled.

## 2024-01-26 ENCOUNTER — Ambulatory Visit (HOSPITAL_COMMUNITY)

## 2024-01-26 NOTE — Telephone Encounter (Signed)
 Called [employer] and spoke with Chantel K. I was transferred to complete a peer-to-peer review for MRCP. I spoke with Dr. Rozelle Corning for about 30 minutes regarding this claim. Dr. Rozelle Corning stated that with the ATM gene mutation patient must have a [facility] that has pancreatic cancer for approval. I did not see this in chart. I reached out to patient to clarify family history, but her vm is full and not accepting any new messages at this time. Dr. Karene Oto, please see note and advise. Thanks     CB [phone] Press 8 Enter tracking# [id number]  Press 4

## 2024-01-26 NOTE — Telephone Encounter (Signed)
 [place],  Could you please contact [employer] to see if you can get this MRI/MCRP approved.  I tried, but they have to speak with a RN or LPN.   Dr Karene Oto is the ordering provider, but I think I ordered it under Deanna.    Thank you

## 2024-01-26 NOTE — Telephone Encounter (Signed)
 Spoke with [employer] and was informed a clinical person or provider can call to further discuss this case.  CB [phone] Press 8 Enter tracking# [id number]  Press 4

## 2024-01-27 ENCOUNTER — Encounter: Payer: Self-pay | Admitting: Obstetrics

## 2024-01-27 ENCOUNTER — Ambulatory Visit (INDEPENDENT_AMBULATORY_CARE_PROVIDER_SITE_OTHER): Admitting: Obstetrics

## 2024-01-27 ENCOUNTER — Other Ambulatory Visit (HOSPITAL_COMMUNITY)
Admission: RE | Admit: 2024-01-27 | Discharge: 2024-01-27 | Disposition: A | Discharge status: Home / Self Care | Source: Other Acute Inpatient Hospital | Attending: Obstetrics | Admitting: Obstetrics

## 2024-01-27 ENCOUNTER — Ambulatory Visit: Admitting: Gastroenterology

## 2024-01-27 VITALS — BP 119/71 | HR 65

## 2024-01-27 DIAGNOSIS — N329 Bladder disorder, unspecified: Secondary | ICD-10-CM

## 2024-01-27 DIAGNOSIS — E119 Type 2 diabetes mellitus without complications: Secondary | ICD-10-CM

## 2024-01-27 DIAGNOSIS — N3281 Overactive bladder: Secondary | ICD-10-CM

## 2024-01-27 DIAGNOSIS — R319 Hematuria, unspecified: Secondary | ICD-10-CM | POA: Diagnosis not present

## 2024-01-27 DIAGNOSIS — Z86711 Personal history of pulmonary embolism: Secondary | ICD-10-CM | POA: Diagnosis not present

## 2024-01-27 DIAGNOSIS — N816 Rectocele: Secondary | ICD-10-CM

## 2024-01-27 DIAGNOSIS — R8290 Unspecified abnormal findings in urine: Secondary | ICD-10-CM

## 2024-01-27 DIAGNOSIS — N393 Stress incontinence (female) (male): Secondary | ICD-10-CM | POA: Diagnosis not present

## 2024-01-27 LAB — POCT URINALYSIS DIPSTICK
Bilirubin, UA: NEGATIVE
Blood, UA: POSITIVE
Glucose, UA: NEGATIVE
Ketones, UA: NEGATIVE
Leukocytes, UA: NEGATIVE
Nitrite, UA: NEGATIVE
Protein, UA: NEGATIVE
Spec Grav, UA: 1.02 (ref 1.010–1.025)
Urobilinogen, UA: 0.2 U/dL
pH, UA: 5.5 (ref 5.0–8.0)

## 2024-01-27 LAB — URINALYSIS, ROUTINE W REFLEX MICROSCOPIC
Bilirubin Urine: NEGATIVE
Glucose, UA: NEGATIVE mg/dL
Ketones, ur: NEGATIVE mg/dL
Leukocytes,Ua: NEGATIVE
Nitrite: NEGATIVE
Protein, ur: NEGATIVE mg/dL
Specific Gravity, Urine: 1.018 (ref 1.005–1.030)
pH: 5 (ref 5.0–8.0)

## 2024-01-27 MED ORDER — GEMTESA 75 MG PO TABS: 75.0000 mg | ORAL_TABLET | Freq: Every day | ORAL | Status: DC

## 2024-01-27 NOTE — Assessment & Plan Note (Addendum)
-   HbA1C 6.5 on 09/03/23, discussed need to maintain goal < 8 prior to surgery - discussed increased risk of perioperative complications, CV/pulm complications, infection, wound complications with DM - discussed possible need for adjustment of medication postop if glucose > 200 - continue to monitor

## 2024-01-27 NOTE — Assessment & Plan Note (Signed)
-   encouraged to continue caffeine reduction  - Trial of gemtesa  for 4 weeks with samples, minimal relief. Provided an additional 4 weeks of samples - We discussed the symptoms of overactive bladder (OAB), which include urinary urgency, urinary frequency, nocturia, with or without urge incontinence.  While we do not know the exact etiology of OAB, several treatment options exist. We discussed management including behavioral therapy (decreasing bladder irritants, urge suppression strategies, timed voids, bladder retraining), physical therapy, medication; for refractory cases posterior tibial nerve stimulation, sacral neuromodulation, and intravesical botulinum toxin injection.  For anticholinergic medications, we discussed the potential side effects of anticholinergics including dry eyes, dry mouth, constipation, cognitive impairment and urinary retention. For Beta-3 agonist medication, we discussed the potential side effect of elevated blood pressure which is more likely to occur in individuals with uncontrolled hypertension. - discussed cystoscopy needed due to h/o bladder surgery to r/o foreign material in bladder. Pt reports possible mesh complicated by bladder injury and discharged home with foley for 1 week.  - discussed possible need for additional OAB treatment postop

## 2024-01-27 NOTE — Progress Notes (Signed)
 [facility] [facility] Return Visit  SUBJECTIVE  History of Present Illness: Christina [name] is a 58 y.o. female seen in follow-up for mixed urinary incontinence, nocturia, history of pelvic surgery, bladder ulcers, and stage III pelvic organ prolapse. Plan at last visit was trial of gemtesa .   Urinary leakage is most bothersome, prior anti-incontinence procedure in the past with possible groin incisions Reports bladder discomfort that persist after urination. Prior Rx bactrim  12/17/23 due to culture with 60K Klebsiells pneumoniae resistant to ampicillin, augmentin , zosyn and intermediate to macrobid. SUI 5-6x/day, with small volume leakage UUI 3-4x/day on gemtesa , 5-6x/day with less bothersome, Gemtesa  for 4 weeks without relief Did not start mirabegron  25mg  due to insurance Tried oxybutynin  without relief with dry mouth, eyes and constipation Leaks 7-8 time(s) per days.  Drinks: 4-6 Diet Pepsi per day reduced to 1.5 bottle/day, increased to 16oz water  per day  Reports OSA, denies CPAP coverage by insurance Leg swelling due to h/o R knee replacement, difficulty putting compression socks on T2DM with HbA1C 6.5 on 09/03/23 Reports history of IBS-C with intermittent Linzess use H/o PONV Last HbA1C 7 on 01/20/24 History of ATM mutation and breast cancer   Urodynamic Impression 10/14/23:  Uroflow Qmax 72mL/sec, voided , PVR 20mL 1. Sensation was increased; capacity was normal 2. Stress Incontinence was demonstrated at normal pressures; 3. 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 4. Emptying was dysfunctional with a normal PVR 40, a sustained detrusor contraction present,  abdominal straining present, dyssynergic urethral sphincter activity on EMG. Pdet at Qmax was 14.5 cm of water .  Qmax was 16.2 mL/sec.    History of PE 05/08/22 after knee surgery, anti-coagulation for 6 months.  Reports history of bladder surgery at [facility] over 20 yrs ago with possible mesh  complicated by bladder injury and discharged home with foley for 1 week.     Past Medical History: Patient  has a past medical history of Anemia, Arthritis, 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, left (oncologist--- dr Lee Public), Cataracts, bilateral, Depression, Diabetes mellitus without complication (HCC), GAD (generalized anxiety disorder), Gallbladder problem, History of ovarian cyst, IBS (irritable bowel syndrome), Joint pain, OSA (obstructive sleep apnea) (09/08/2023), PONV (postoperative nausea and vomiting), Retinal detachment, Right knee meniscal tear, and Urgency of urination.   Past Surgical History: She  has a past surgical history that includes Cesarean section (4098; 1989; 1991); Achilles tendon repair (Right, 2010;  revision 2011); Breast reconstruction with placement of tissue expander and flex hd (acellular hydrated dermis) (Bilateral, 01/09/2016); Nipple sparing mastectomy/sentinal lymph node biopsy/reconstruction/placement of tissue expander (Bilateral, 01/09/2016); Breast reconstruction with placement of tissue expander and flex hd (acellular hydrated dermis) (Bilateral, 01/09/2016); Incision and drainage of wound (Bilateral, 02/11/2016); Removal of tissue expander (Bilateral, 02/11/2016); Breast reconstruction with placement of tissue expander and flex hd (acellular hydrated dermis) (Bilateral, 05/29/2016); Removal of bilateral tissue expanders with placement of bilateral breast implants (Bilateral, 08/20/2016); Breast reduction surgery (Bilateral, 11/26/2016); Liposuction with lipofilling (Bilateral, 11/26/2016); Liposuction with lipofilling (Bilateral, 01/21/2017); Breast implant removal (Bilateral, 08/12/2017); Tissue expander placement (Bilateral, 08/12/2017); Removal of bilateral tissue expanders with placement of bilateral breast implants (Bilateral, 11/05/2017); Mastectomy (Bilateral, 01/09/2016); Total abdominal hysterectomy (05/1996); Laparoscopic cholecystectomy (~ 1999);  Tubal ligation (Bilateral, 1991); Breast biopsy (Left, 10/2015); Laparoscopic appendectomy (04-07-2011   @[facility] ); Elbow surgery (Right, x3   last one 02-01-2019 @ [facility]); Bladder suspension (2000); FAT GRAFTING BILATERAL BREAST (08-09-2018  @[facility] ); Knee arthroscopy with medial menisectomy (Right, 12/20/2019); Liposuction with lipofilling (Bilateral, 06/28/2020); Retinal detachment surgery (Left, 10/01/2020); Retinal  detachment surgery (Left, 10/04/2020); Eye surgery (Left, 10/01/2020); Eye surgery (Left, 10/04/2020); Vitrectomy 25 gauge with scleral buckle (Left, 10/04/2020); Scleral buckle (Left, 10/04/2020); Laser photo ablation (Right, 10/04/2020); Gas/fluid exchange (Left, 10/04/2020); Gas insertion (Left, 10/04/2020); Photocoagulation with laser (Left, 10/04/2020); Knee arthroscopy with medial menisectomy (Right, 10/30/2021); Total knee arthroplasty (Right, 03/31/2022); and Bilateral total mastectomy with axillary lymph node dissection.   Medications: She has a current medication list which includes the following prescription(s): desvenlafaxine , hydrochlorothiazide , hydroxyzine , restasis , estradiol , and gemtesa .   Allergies: Patient has no known allergies.   Social History: Patient  reports that she quit smoking about 15 years ago. Her smoking use included cigarettes. She started smoking about 25 years ago. She has a 10 pack-year smoking history. She has never been exposed to tobacco smoke. She has never used smokeless tobacco. She reports that she does not drink alcohol and does not use drugs.     OBJECTIVE     Physical Exam: Vitals:   01/27/24 1057  BP: 119/71  Pulse: 65   Gen: No apparent distress, A&O x 3.  Detailed Urogynecologic Evaluation:  Deferred. Prior exam showed:  POP-Q  -2                                            Aa   -2                                           Ba  -8                                              C   4                                             Gh  5                                            Pb  9                                            tvl   1                                            Ap  1                                            Bp  D   C -7 in standing position   Lab Results  Component Value Date   COLORU Yellow 12/17/2023   CLARITYU Clear 12/17/2023   GLUCOSEUR Negative 12/17/2023   BILIRUBINUR Negative 12/17/2023   KETONESU Negative 12/17/2023   SPECGRAV 1.025 12/17/2023   RBCUR Moderate 12/17/2023   PHUR 5.5 12/17/2023   PROTEINUR Negative 12/17/2023   UROBILINOGEN 0.2 12/17/2023   LEUKOCYTESUR Negative 12/17/2023        No data to display             ASSESSMENT AND PLAN    Ms. Brophy is a 58 y.o. with:  1. OAB (overactive bladder)   2. SUI (stress urinary incontinence, female)   3. Lesion of bladder   4. Posterior vaginal wall prolapse   5. Type 2 diabetes mellitus without complication, without long-term current use of insulin  (HCC)   6. History of pulmonary embolism   7. Abnormal urinalysis     OAB (overactive bladder) Assessment & Plan: - encouraged to continue caffeine reduction  - Trial of gemtesa  for 4 weeks with samples, minimal relief. Provided an additional 4 weeks of samples - We discussed the symptoms of overactive bladder (OAB), which include urinary urgency, urinary frequency, nocturia, with or without urge incontinence.  While we do not know the exact etiology of OAB, several treatment options exist. We discussed management including behavioral therapy (decreasing bladder irritants, urge suppression strategies, timed voids, bladder retraining), physical therapy, medication; for refractory cases posterior tibial nerve stimulation, sacral neuromodulation, and intravesical botulinum toxin injection.  For anticholinergic medications, we discussed the potential side effects of anticholinergics including dry eyes, dry mouth,  constipation, cognitive impairment and urinary retention. For Beta-3 agonist medication, we discussed the potential side effect of elevated blood pressure which is more likely to occur in individuals with uncontrolled hypertension. - discussed cystoscopy needed due to h/o bladder surgery to r/o foreign material in bladder. Pt reports possible mesh complicated by bladder injury and discharged home with foley for 1 week.  - discussed possible need for additional OAB treatment postop   Orders: -     Gemtesa ; Take 1 tablet (75 mg total) by mouth daily. LOT: [id number] EXP: 09/2027 -     POCT urinalysis dipstick  SUI (stress urinary incontinence, female) Assessment & Plan: - most bothersome - For treatment of stress urinary incontinence,  non-surgical options include expectant management, weight loss, physical therapy, as well as a pessary.  Surgical options include a midurethral sling, Burch urethropexy, and transurethral injection of a bulking agent. - urodynamics with SUI and dysfunctional emptying, discussed increased risk of postoperative urinary retention - we discussed office procedure with urethral bulking (Bulkamid). We discussed success rate of approximately 70-80% and possible need for second injection. We reviewed that this is not a permanent procedure and the Bulkamid does dissolve over time. Risks reviewed including injury to bladder or urethra, UTI, urinary retention and hematuria.  - Sling: The effectiveness of a midurethral vaginal mesh sling is approximately 85%, and thus, there will be times when you may leak urine after surgery, especially if your bladder is full or if you have a strong cough. There is a balance between making the sling tight enough to treat your leakage but not too tight so that you have long-term difficulty emptying your bladder. A mesh sling will not directly treat overactive bladder/urge incontinence and may worsen it.  There is an FDA safety notification on vaginal  mesh procedures for prolapse but NOT mesh slings. We  have extensive experience and training with mesh placement and we have close postoperative follow up to identify any potential complications from mesh. It is important to realize that this mesh is a permanent implant that cannot be easily removed. There are rare risks of mesh exposure (2-4%), pain with intercourse (0-7%), and infection (<1%). The risk of mesh exposure if more likely in a woman with risks for poor healing (prior radiation, poorly controlled diabetes, or immunocompromised). The risk of new or worsened chronic pain after mesh implant is more common in women with baseline chronic pain and/or poorly controlled anxiety or depression. Approximately 2-4% of patients will experience longer-term post-operative voiding dysfunction that may require surgical revision of the sling. We also reviewed that postoperatively, her stream may not be as strong as before surgery.  - pt desires to proceed with urethral bulking, cystourethroscopy  Orders: -     POCT urinalysis dipstick  Lesion of bladder Assessment & Plan: - discussed bladder biopsy at the time of prolapse repair - history of ATM mutation and breast cancer - if positive for malignancy, will refer to urology   Posterior vaginal wall prolapse Assessment & Plan: - For treatment of pelvic organ prolapse, we previously discussed options for management including expectant management, conservative management, and surgical management, such as Kegels, a pessary, pelvic floor physical therapy, and specific surgical procedures. - pt desires surgical intervention, discussed need to optimize stool consistency prior to surgery. For constipation, we reviewed the importance of a better bowel regimen.  We also discussed the importance of avoiding chronic straining, as it can exacerbate her pelvic floor symptoms; we discussed treating constipation and straining prior to surgery, as postoperative straining  can lead to damage to the repair and recurrence of symptoms. We discussed initiating therapy with increasing fluid intake, fiber supplementation, stool softeners, and laxatives such as miralax.  - repeat exam standing with C at -7 - encouraged weight reduction and Kegel exercises - discussed risks and benefits of anterior posterior repair, perineorrhaphy   Type 2 diabetes mellitus without complication, without long-term current use of insulin  [facility]) Assessment & Plan: - HbA1C 6.5 on 09/03/23, discussed need to maintain goal < 8 prior to surgery - discussed increased risk of perioperative complications, CV/pulm complications, infection, wound complications with DM - discussed possible need for adjustment of medication postop if glucose > 200 - continue to monitor   History of pulmonary embolism Assessment & Plan: - history of PE after knee surgery with anti-coagulation for 6 months - discussed need for perioperative anti-coagluation for 4 weeks postop   Abnormal urinalysis Assessment & Plan: - UA + heme on 12/17/23, repeat POCT UA + heme - pending UA microscopy and culture - if positive for microscopic hematuria, will order CT urogram  - cystoscopy 12/17/23 with bladder ulcers and history of ATM mutation - pending bladder biopsy    Time spent: I spent 32 minutes dedicated to the care of this patient on the date of this encounter to include pre-visit review of records, face-to-face time with the patient discussing stage II pelvic organ prolapse, mixed urinary incontinence, bladder lesions with abnormal urinalysis, T2DM, history of PE, and post visit documentation and ordering medication/ testing.   Darlene Ehlers, MD

## 2024-01-27 NOTE — Assessment & Plan Note (Signed)
-   most bothersome - For treatment of stress urinary incontinence,  non-surgical options include expectant management, weight loss, physical therapy, as well as a pessary.  Surgical options include a midurethral sling, Burch urethropexy, and transurethral injection of a bulking agent. - urodynamics with SUI and dysfunctional emptying, discussed increased risk of postoperative urinary retention - we discussed office procedure with urethral bulking (Bulkamid). We discussed success rate of approximately 70-80% and possible need for second injection. We reviewed that this is not a permanent procedure and the Bulkamid does dissolve over time. Risks reviewed including injury to bladder or urethra, UTI, urinary retention and hematuria.  - Sling: The effectiveness of a midurethral vaginal mesh sling is approximately 85%, and thus, there will be times when you may leak urine after surgery, especially if your bladder is full or if you have a strong cough. There is a balance between making the sling tight enough to treat your leakage but not too tight so that you have long-term difficulty emptying your bladder. A mesh sling will not directly treat overactive bladder/urge incontinence and may worsen it.  There is an FDA safety notification on vaginal mesh procedures for prolapse but NOT mesh slings. We have extensive experience and training with mesh placement and we have close postoperative follow up to identify any potential complications from mesh. It is important to realize that this mesh is a permanent implant that cannot be easily removed. There are rare risks of mesh exposure (2-4%), pain with intercourse (0-7%), and infection (<1%). The risk of mesh exposure if more likely in a woman with risks for poor healing (prior radiation, poorly controlled diabetes, or immunocompromised). The risk of new or worsened chronic pain after mesh implant is more common in women with baseline chronic pain and/or poorly controlled  anxiety or depression. Approximately 2-4% of patients will experience longer-term post-operative voiding dysfunction that may require surgical revision of the sling. We also reviewed that postoperatively, her stream may not be as strong as before surgery.  - pt desires to proceed with urethral bulking, cystourethroscopy

## 2024-01-27 NOTE — Assessment & Plan Note (Addendum)
-   UA + heme on 12/17/23, repeat POCT UA + heme - pending UA microscopy and culture - if positive for microscopic hematuria, will order CT urogram  - cystoscopy 12/17/23 with bladder ulcers and history of ATM mutation - pending bladder biopsy - Prior Rx bactrim  12/17/23 due to culture with 60K Klebsiells pneumoniae resistant to ampicillin, augmentin , zosyn and intermediate to macrobid. Repeat treatment if culture positive prior to surgery

## 2024-01-27 NOTE — Assessment & Plan Note (Signed)
-   history of PE after knee surgery with anti-coagulation for 6 months - discussed need for perioperative anti-coagluation for 4 weeks postop

## 2024-01-27 NOTE — Assessment & Plan Note (Addendum)
-   For treatment of pelvic organ prolapse, we previously discussed options for management including expectant management, conservative management, and surgical management, such as Kegels, a pessary, pelvic floor physical therapy, and specific surgical procedures. - pt desires surgical intervention, discussed need to optimize stool consistency prior to surgery. For constipation, we reviewed the importance of a better bowel regimen.  We also discussed the importance of avoiding chronic straining, as it can exacerbate her pelvic floor symptoms; we discussed treating constipation and straining prior to surgery, as postoperative straining can lead to damage to the repair and recurrence of symptoms. We discussed initiating therapy with increasing fluid intake, fiber supplementation, stool softeners, and laxatives such as miralax.  - repeat exam standing with C at -7 - encouraged weight reduction and Kegel exercises - discussed risks and benefits of anterior posterior repair, perineorrhaphy

## 2024-01-27 NOTE — Telephone Encounter (Signed)
 Called [employer] and spoke with Dr. Nalani Ave. They denied MRCP. They have to go based on the insurance ([employer])guidelines and those have not been updated. We are going to start an appeal, I am just waiting on the information from pre-certification team.  Dr. Karene Oto has been notified.

## 2024-01-27 NOTE — Assessment & Plan Note (Signed)
-   discussed bladder biopsy at the time of prolapse repair - history of ATM mutation and breast cancer - if positive for malignancy, will refer to urology

## 2024-01-27 NOTE — Patient Instructions (Addendum)
 We discussed anterior and posterior repair with perineorrhaphy for prolapse repair:  Vaginal repair without mesh - Pros - safer, no mesh complications - Cons - not as strong as mesh repair, higher risk of recurrence  Continue to monitor your blood sugar.   For treatment of stress urinary incontinence,  non-surgical options include expectant management, weight loss, physical therapy, as well as a pessary.  Surgical options include a midurethral sling, Burch urethropexy, and transurethral injection of a bulking agent.  1) We discussed an office procedure with urethral bulking (Bulkamid). We discussed success rate of approximately 70-80% and possible need for second injection. We reviewed that this is not a permanent procedure and the Bulkamid does dissolve over time. Risks reviewed including injury to bladder or urethra, UTI, urinary retention and hematuria.   2) Sling: The effectiveness of a midurethral vaginal mesh sling is approximately 85%, and thus, there will be times when you may leak urine after surgery, especially if your bladder is full or if you have a strong cough. There is a balance between making the sling tight enough to treat your leakage but not too tight so that you have long-term difficulty emptying your bladder. A mesh sling will not directly treat overactive bladder/urge incontinence and may worsen it.  There is an FDA safety notification on vaginal mesh procedures for prolapse but NOT mesh slings. We have extensive experience and training with mesh placement and we have close postoperative follow up to identify any potential complications from mesh. It is important to realize that this mesh is a permanent implant that cannot be easily removed. There are rare risks of mesh exposure (2-4%), pain with intercourse (0-7%), and infection (<1%). The risk of mesh exposure if more likely in a woman with risks for poor healing (prior radiation, poorly controlled diabetes, or immunocompromised).  The risk of new or worsened chronic pain after mesh implant is more common in women with baseline chronic pain and/or poorly controlled anxiety or depression. Approximately 2-4% of patients will experience longer-term post-operative voiding dysfunction that may require surgical revision of the sling. We also reviewed that postoperatively, her stream may not be as strong as before surgery.   We will need to continue blood thinners after your surgery due to your history of pulmonary embolism.  We discussed risks of bleeding, infection, damage to surrounding organs including bowel, bladder, blood vessels, ureters and nerves, need for further surgery, numbness and weakness at any body site, buttock pain, postoperative cognitive dysfunction, and the rarer risks of blood clot, heart attack, pneumonia, death.

## 2024-01-28 LAB — URINE CULTURE: Culture: NO GROWTH

## 2024-01-29 ENCOUNTER — Ambulatory Visit: Payer: Self-pay | Admitting: Obstetrics

## 2024-01-30 ENCOUNTER — Ambulatory Visit (HOSPITAL_COMMUNITY)

## 2024-02-02 ENCOUNTER — Telehealth: Payer: Self-pay

## 2024-02-02 NOTE — Telephone Encounter (Signed)
-----   Message from Annis Kinder sent at 01/29/2024  1:37 PM EDT ----- Received prior colonoscopy report from Dr. Tova Fresh which was notable for the following: - 07/18/2019: Colonoscopy for CRC screening: 7 mm distal transverse colon polyp removed with hot snare (path: Benign colonic mucosa), lots of residual stool in the colon despite aggressive lavage and recommended repeat in 5 years with extended bowel prep.  Based on these results, recommend repeat colonoscopy this year due to previous fair prep and for ongoing surveillance.  Plan for Sutab per patient request, along with extended 2-day bowel preparation.  Thanks!

## 2024-02-02 NOTE — Telephone Encounter (Signed)
Left message for patient to return call to further discuss. Will continue efforts.  

## 2024-02-03 ENCOUNTER — Telehealth: Payer: Self-pay | Admitting: Gastroenterology

## 2024-02-03 NOTE — Telephone Encounter (Signed)
Attempted to reach patient by phone, but there was no answer and no voicemail had been set up.  Will continue efforts.  

## 2024-02-03 NOTE — Telephone Encounter (Signed)
 Good morning Dr. Karene Oto,   Patient's colonoscopy report from 2020 with [facility] is now available for you to review in [employer].   Thank you.

## 2024-02-05 NOTE — Telephone Encounter (Signed)
Attempted to reach patient by phone, but there was no answer and no voicemail had been set up.  Will continue efforts.  

## 2024-02-08 ENCOUNTER — Ambulatory Visit: Payer: Self-pay

## 2024-02-08 ENCOUNTER — Other Ambulatory Visit: Payer: Self-pay

## 2024-02-08 MED ORDER — METFORMIN HCL 500 MG PO TABS: 500.0000 mg | ORAL_TABLET | Freq: Every day | ORAL | 3 refills | Status: DC

## 2024-02-08 NOTE — Telephone Encounter (Signed)
 Left message on voicemail for patient to return call to further discuss. Will continue efforts.

## 2024-02-08 NOTE — Telephone Encounter (Signed)
  Chief Complaint: hyperglycemia Symptoms:  blood glucose 200-300s range, frequent urination, nausea, shakiness, fatigue Frequency: x 1-2 days Pertinent Negatives: Patient denies fever, increased thirst, difficulty breathing, dizziness, weakness, vomiting. Disposition: [] ED /[] Urgent Care (no appt availability in office) / [] Appointment(In office/virtual)/ []  [facility]/ [] Home Care/ [] Refused Recommended Disposition /[] [facility]/ [x]  Follow-up with PCP Additional Notes: Patient states she has an appointment on 02/15/24 with her new provider at [facility]. She is not on insulin  or any oral diabetes medications. Called [facility] and spoke with Odilia Bennett, informed sending high priority message for provider.  Copied from CRM [id number]. Topic: Clinical - Red Word Triage >> Feb 08, 2024  9:10 AM Everette C wrote: [facility] that prompted transfer to Nurse Triage: The patient has called to share that their blood sugar was greater than 300 when last checked Reason for Disposition  [1] Blood glucose > 300 mg/dL (40.9 mmol/L) AND [8] two or more times in a row  Answer Assessment - Initial Assessment Questions 1. BLOOD GLUCOSE: "What is your blood glucose level?"      380, 336, 330. (Friday it was 169)  2. ONSET: "When did you check the blood glucose?"     This morning when waking up.  3. USUAL RANGE: "What is your glucose level usually?" (e.g., usual fasting morning value, usual evening value)     She states she is not sure, she has not been checking it. She states she just got the test strips yesterday.  4. KETONES: "Do you check for ketones (urine or blood test strips)?" If Yes, ask: "What does the test show now?"      No.  5. TYPE 1 or 2:  "Do you know what type of diabetes you have?"  (e.g., Type 1, Type 2, Gestational; doesn't know)      Type 2.  6. INSULIN : "Do you take insulin ?" "What type of insulin (s) do you use? What is the mode of delivery? (syringe, pen; injection or  pump)?"      No.  7. DIABETES PILLS: "Do you take any pills for your diabetes?" If Yes, ask: "Have you missed taking any pills recently?"     No.  8. OTHER SYMPTOMS: "Do you have any symptoms?" (e.g., fever, frequent urination, difficulty breathing, dizziness, weakness, vomiting)     Frequent urination, nausea, shakiness, fatigue  9. PREGNANCY: "Is there any chance you are pregnant?" "When was your last menstrual period?"     N/A.  Protocols used: Diabetes - High Blood Sugar-A-AH

## 2024-02-08 NOTE — Telephone Encounter (Signed)
 Pt called reporting recent blood sugars in the 300's. Not currently on any insulin -lowering medications. A1c increased from 6.5 to 7 - checked two weeks ago by another provider. Patient agreeable to starting Metformin  500 mg once daily. Pt advised to keep her appointment with me on 02/15/24 and we can discuss issue further and see how she is doing on the Metformin .

## 2024-02-10 ENCOUNTER — Ambulatory Visit: Admitting: Family Medicine

## 2024-02-11 NOTE — Telephone Encounter (Signed)
 After several attempts to reach the patient by phone with no success, a letter was mailed to the patient with Dr Pecola Bouquet recommendations as listed below"  "Thank you.  I received and reviewed the prior colonoscopy report from Dr. Tova Fresh and placed an addendum in the chart from my recent appointment.  Colonoscopy was notable for the following: - 07/18/2019: Colonoscopy for CRC screening: 7 mm distal transverse colon polyp removed with hot snare (path: Benign colonic mucosa), lots of residual stool in the colon despite aggressive lavage and recommended repeat in 5 years with extended bowel prep.   Based on these results, recommend repeat colonoscopy this year due to previous fair prep and for ongoing surveillance.  Plan for Sutab's (per patient preference) along with extended 2-day bowel preparation."

## 2024-02-12 ENCOUNTER — Other Ambulatory Visit: Payer: Self-pay

## 2024-02-12 ENCOUNTER — Ambulatory Visit: Admitting: Plastic Surgery

## 2024-02-12 DIAGNOSIS — Z860100 Personal history of colon polyps, unspecified: Secondary | ICD-10-CM

## 2024-02-12 DIAGNOSIS — R222 Localized swelling, mass and lump, trunk: Secondary | ICD-10-CM

## 2024-02-12 MED ORDER — SUTAB 1479-225-188 MG PO TABS: 24.0000 | ORAL_TABLET | ORAL | 0 refills | Status: DC

## 2024-02-12 NOTE — Progress Notes (Signed)
   Subjective:    Patient ID: Christina [name], female    DOB: 03-Oct-1965, 58 y.o.   MRN: [id number]  The patient is a 58 year old female joining me by phone for further discussion about the mass on her left back area.  We did an ultrasound in May which showed a large solid mass in the left posterior chest wall measuring 14 cm.  It was not specified in an MRI contrast enhancement was recommended.  I am talking to the patient today by phone.  She is in [place]  at the office.  She agrees      Review of Systems  Constitutional: Negative.   Eyes: Negative.   Respiratory: Negative.    Cardiovascular: Negative.        Objective:   Physical Exam        Assessment & Plan:     ICD-10-CM   1. Mass of skin of back  R22.2        Plan for MRI of left back for evaluation of 14 cm mass.  I connected with  Pranathi Winfree Rozman on 02/12/24 by phone and verified that I am speaking with the correct person using two identifiers.  We spent 2 minutes in discussion.  Will plan for the MRI.   I discussed the limitations of evaluation and management by telemedicine. The patient expressed understanding and agreed to proceed.

## 2024-02-12 NOTE — Telephone Encounter (Signed)
 Patient scheduled for procedure with provider on 7/29 at 9:30 am

## 2024-02-15 ENCOUNTER — Ambulatory Visit (INDEPENDENT_AMBULATORY_CARE_PROVIDER_SITE_OTHER)

## 2024-02-15 VITALS — BP 123/80 | HR 65 | Ht 67.0 in | Wt 219.0 lb

## 2024-02-15 DIAGNOSIS — F321 Major depressive disorder, single episode, moderate: Secondary | ICD-10-CM

## 2024-02-15 DIAGNOSIS — E119 Type 2 diabetes mellitus without complications: Secondary | ICD-10-CM | POA: Diagnosis not present

## 2024-02-15 DIAGNOSIS — F411 Generalized anxiety disorder: Secondary | ICD-10-CM

## 2024-02-15 DIAGNOSIS — G4733 Obstructive sleep apnea (adult) (pediatric): Secondary | ICD-10-CM | POA: Diagnosis not present

## 2024-02-15 DIAGNOSIS — I10 Essential (primary) hypertension: Secondary | ICD-10-CM | POA: Diagnosis not present

## 2024-02-15 DIAGNOSIS — R200 Anesthesia of skin: Secondary | ICD-10-CM

## 2024-02-15 DIAGNOSIS — Z7984 Long term (current) use of oral hypoglycemic drugs: Secondary | ICD-10-CM | POA: Diagnosis not present

## 2024-02-15 LAB — POCT UA - MICROALBUMIN
Albumin/Creatinine Ratio, Urine, POC: <30
Creatinine, POC: 300 mg/dL
Microalbumin Ur, POC: 30 mg/L

## 2024-02-15 NOTE — Assessment & Plan Note (Signed)
 Referral to PM&R placed today for nerve conduction study. Will review results with patient and discuss medication therapy (I.e Gabapentin) once the conduction study is complete. Stressed the importance of tight A1c control to prevent progression/worsening of numbness/pain.

## 2024-02-15 NOTE — Patient Instructions (Addendum)
 It was nice to see you today!  As we discussed in clinic:  -Continue your Metformin  500 mg daily for diabetes. Continue checking your blood sugar periodically. Aiming for readings between 75-120. -Continue hydrochlorothiazide  for blood pressure  -We will plan to increase your Pristiq  to 75 mg (1.5 pills) for 2 weeks. If you are tolerating well, then we can plan to increase to 100 mg tablets.  -I have placed the referral for your nerve conduction study to evaluate for neuropathy. They will call you to schedule this.   I will plan to see you in 3 months for follow up on diabetes and mood.    If you have any problems before your next visit feel free to message me via MyChart (minor issues or questions) or call the office, otherwise you may reach out to schedule an office visit.  Thank you! Meryl Acosta, PA-C

## 2024-02-15 NOTE — Assessment & Plan Note (Signed)
 Mood worsening since last visit in December due to family and health stress. Will plan to increase Pristiq  to 75 mg x 2 weeks for MDD and GAD. Instructed patient to take 1.5 pills for 2 weeks and if tolerating well, will continue to titrate up to 100 mg thereafter. Will plan to follow up in person on mood in 3 months.

## 2024-02-15 NOTE — Assessment & Plan Note (Signed)
 BP goal <130/80. BP at goal today and reportedly at goal on ambulatory BP monitoring. Continue hydrochlorothiazide  12.5 mg daily. Will continue to monitor.

## 2024-02-15 NOTE — Assessment & Plan Note (Signed)
 Stable. Continue wearing CPAP at night as prescribed. Will continue to monitor.

## 2024-02-15 NOTE — Progress Notes (Signed)
 Established Patient Office Visit  Subjective   Patient ID: MARKEETA SCALF, female    DOB: 29-May-1966  Age: 58 y.o. MRN: [id number]  No chief complaint on file.  HPI  Christina [name] is a 58 y.o. female presenting today for follow up of diabetes, HTN, sleep apnea, mood.    Diabetes: denies hypoglycemic events, wounds or sores that are not healing well, increased thirst or urination.  Was previously managing with nutrition and physical activity, however last week A1c came back elevated from 6.5-7.0.  Patient was agreeable to starting 500 mg of metformin  daily.  She reports compliance with this medication and that she is tolerating it well. She has been checking her BG at home and reports improved, but still elevated readings at 140's-160's after 1 wk of Metformin . Also endorses some worsening numbness/stabbing pain in her toes but reports it is not affecting her gait. She is UTD on diabetic eye exam through [facility] on [address] (will make an attempt to request records).  Mood: Patient is here to follow up for depression and anxiety, currently managing with Pristiq . Taking desvenlafaxine  50 mg daily medication without side effects, reports excellent compliance with treatment. Denies SI/HI. Reports that due to family related stress, she has felt more anxious than normal and feels that she needs to increase her dose of Pristiq .   Hypertension: Currently taking 12.5 mg HCTZ.  She reports excellent compliance with this medication.  She reports that she does check her blood pressure at home occasionally at home and it is normal.  Denies chest pain, palpitations, headache, vision changes, edema.  Sleep apnea: At her last visit with Britta Candy in January, she had Britta Candy went over the results of her sleep study which revealed sleep apnea.  Since then patient has been sleeping with a CPAP at night.  She reports she finally got her CPAP in May and has been using it the last few weeks.     ROS Per  HPI.    Objective:     BP 123/80   Pulse 65   Ht 5\' 7"  (1.702 m)   Wt 219 lb (99.3 kg)   LMP  (LMP Unknown)   SpO2 95%   BMI 34.30 kg/m    Physical Exam Constitutional:      General: She is not in acute distress.    Appearance: Normal appearance.  Cardiovascular:     Rate and Rhythm: Normal rate and regular rhythm.     Heart sounds: Normal heart sounds. No murmur heard.    No friction rub. No gallop.  Pulmonary:     Effort: Pulmonary effort is normal. No respiratory distress.     Breath sounds: Normal breath sounds.  Musculoskeletal:        General: No swelling.     Cervical back: Neck supple.  Skin:    General: Skin is warm and dry.  Neurological:     General: No focal deficit present.     Mental Status: She is alert.  Psychiatric:        Mood and Affect: Mood normal.        Behavior: Behavior normal.        Thought Content: Thought content normal.    Results for orders placed or performed in visit on 02/15/24  POCT UA - Microalbumin  Result Value Ref Range   Microalbumin Ur, POC 30 mg/L   Creatinine, POC 300 mg/dL   Albumin/Creatinine Ratio, Urine, POC <30  Last CBC Lab Results  Component Value Date   WBC 7.2 09/03/2023   HGB 13.7 09/03/2023   HCT 43.1 09/03/2023   MCV 85 09/03/2023   MCH 26.9 09/03/2023   RDW 13.7 09/03/2023   PLT 328 09/03/2023   Last metabolic panel Lab Results  Component Value Date   GLUCOSE 128 (H) 09/03/2023   NA 142 09/03/2023   K 4.4 09/03/2023   CL 109 (H) 09/03/2023   CO2 21 09/03/2023   BUN 15 09/03/2023   CREATININE 0.67 09/03/2023   EGFR 103 09/03/2023   CALCIUM 9.9 09/03/2023   PROT 6.1 09/03/2023   ALBUMIN 3.8 09/03/2023   LABGLOB 2.3 09/03/2023   AGRATIO 1.3 03/30/2023   BILITOT 0.2 09/03/2023   ALKPHOS 91 09/03/2023   AST 37 09/03/2023   ALT 61 (H) 09/03/2023   ANIONGAP 4 (L) 06/28/2022   Last lipids Lab Results  Component Value Date   CHOL 141 09/03/2023   HDL 40 09/03/2023   LDLCALC 86  09/03/2023   TRIG 78 09/03/2023   CHOLHDL 3.5 09/03/2023   Last hemoglobin A1c Lab Results  Component Value Date   HGBA1C 7.0 (H) 01/20/2024   Last thyroid  functions Lab Results  Component Value Date   TSH 2.270 02/12/2023   T3TOTAL 146 09/16/2021      The 10-year ASCVD risk score (Arnett DK, et al., 2019) is: 4.9%    Assessment & Plan:   Type 2 diabetes mellitus without complication, without long-term current use of insulin  (HCC) Assessment & Plan: A1c goal <7. UTD on diabetic eye exam. Most recent A1c increased from 6.5 to 7.0. Started Metformin  500 mg every day 1 wk ago. Continue this medication and will recheck A1c in 3 months. Foot exam and UACR performed today.   Orders: -     POCT UA - Microalbumin -     Ambulatory referral to Physical Medicine Rehab  Primary hypertension Assessment & Plan: BP goal <130/80. BP at goal today and reportedly at goal on ambulatory BP monitoring. Continue hydrochlorothiazide  12.5 mg daily. Will continue to monitor.   Numbness of toes Assessment & Plan: Referral to PM&R placed today for nerve conduction study. Will review results with patient and discuss medication therapy (I.e Gabapentin) once the conduction study is complete. Stressed the importance of tight A1c control to prevent progression/worsening of numbness/pain.  Orders: -     Ambulatory referral to Physical Medicine Rehab  OSA (obstructive sleep apnea) Assessment & Plan: Stable. Continue wearing CPAP at night as prescribed. Will continue to monitor.   MDD (major depressive disorder), single episode, moderate (HCC) Assessment & Plan: Mood worsening since last visit in December due to family and health stress. Will plan to increase Pristiq  to 75 mg x 2 weeks for MDD and GAD. Instructed patient to take 1.5 pills for 2 weeks and if tolerating well, will continue to titrate up to 100 mg thereafter. Will plan to follow up in person on mood in 3 months.   GAD (generalized  anxiety disorder) Assessment & Plan: Mood worsening since last visit in December due to family and health stress. Will plan to increase Pristiq  to 75 mg x 2 weeks for MDD and GAD. Instructed patient to take 1.5 pills for 2 weeks and if tolerating well, will continue to titrate up to 100 mg thereafter. Will plan to follow up in person on mood in 3 months.     Return in about 3 months (around 05/17/2024) for Mood, HTN, DM.  Odilia Bennett, PA-C

## 2024-02-15 NOTE — Assessment & Plan Note (Signed)
 A1c goal <7. UTD on diabetic eye exam. Most recent A1c increased from 6.5 to 7.0. Started Metformin  500 mg every day 1 wk ago. Continue this medication and will recheck A1c in 3 months. Foot exam and UACR performed today.

## 2024-02-16 ENCOUNTER — Telehealth: Admitting: Plastic Surgery

## 2024-02-18 ENCOUNTER — Ambulatory Visit (HOSPITAL_BASED_OUTPATIENT_CLINIC_OR_DEPARTMENT_OTHER): Admission: RE | Admit: 2024-02-18 | Source: Ambulatory Visit

## 2024-02-18 DIAGNOSIS — Z419 Encounter for procedure for purposes other than remedying health state, unspecified: Secondary | ICD-10-CM | POA: Diagnosis not present

## 2024-02-22 ENCOUNTER — Ambulatory Visit: Admitting: Obstetrics

## 2024-02-22 DIAGNOSIS — G4733 Obstructive sleep apnea (adult) (pediatric): Secondary | ICD-10-CM | POA: Diagnosis not present

## 2024-02-23 ENCOUNTER — Ambulatory Visit: Admitting: Obstetrics

## 2024-02-23 ENCOUNTER — Telehealth: Payer: Self-pay

## 2024-02-23 ENCOUNTER — Telehealth: Payer: Self-pay | Admitting: Gastroenterology

## 2024-02-23 NOTE — Telephone Encounter (Signed)
 Copied from CRM [id number]. Topic: Clinical - Lab/Test Results >> Feb 23, 2024  1:04 PM Santiya F wrote: Reason for CRM: Patient is calling in because she has questions about her lab results. She called in yesterday and sent a message on MyChart. Please follow up with patient.

## 2024-02-23 NOTE — Telephone Encounter (Signed)
 Joey, please see note. Patient does need MRI, looks like insurance company is reaching out. Will you follow up with them? Thanks

## 2024-02-23 NOTE — Telephone Encounter (Signed)
 Scheduled the lab appt for the week before also she was just wondering if her urine was normal due to her not seeing the normal ranges. Questions were answered

## 2024-02-23 NOTE — Telephone Encounter (Signed)
 Well rep named [place] called and stated that she is needing to know weather this patient is still needing to have an MRI of her abdomin with contrast. A good contact number for well care is [phone]. Please advise.

## 2024-02-24 ENCOUNTER — Encounter: Admitting: Obstetrics and Gynecology

## 2024-02-25 ENCOUNTER — Other Ambulatory Visit: Payer: Self-pay | Admitting: Obstetrics and Gynecology

## 2024-02-25 ENCOUNTER — Ambulatory Visit (INDEPENDENT_AMBULATORY_CARE_PROVIDER_SITE_OTHER): Admitting: Obstetrics and Gynecology

## 2024-02-25 VITALS — BP 134/69 | HR 59 | Wt 221.8 lb

## 2024-02-25 DIAGNOSIS — N3281 Overactive bladder: Secondary | ICD-10-CM

## 2024-02-25 DIAGNOSIS — Z01818 Encounter for other preprocedural examination: Secondary | ICD-10-CM

## 2024-02-25 DIAGNOSIS — N393 Stress incontinence (female) (male): Secondary | ICD-10-CM

## 2024-02-25 MED ORDER — POLYETHYLENE GLYCOL 3350 17 GM/SCOOP PO POWD: 17.0000 g | Freq: Every day | ORAL | 0 refills | Status: AC

## 2024-02-25 MED ORDER — ACETAMINOPHEN 500 MG PO TABS: 500.0000 mg | ORAL_TABLET | Freq: Four times a day (QID) | ORAL | 0 refills | Status: DC | PRN

## 2024-02-25 MED ORDER — IBUPROFEN 600 MG PO TABS: 600.0000 mg | ORAL_TABLET | Freq: Four times a day (QID) | ORAL | 0 refills | Status: DC | PRN

## 2024-02-25 MED ORDER — OXYCODONE HCL 5 MG PO TABS: 5.0000 mg | ORAL_TABLET | ORAL | 0 refills | Status: DC | PRN

## 2024-02-25 MED ORDER — ENOXAPARIN SODIUM 40 MG/0.4ML IJ SOSY: 40.0000 mg | PREFILLED_SYRINGE | INTRAMUSCULAR | 0 refills | Status: DC

## 2024-02-25 NOTE — H&P (Signed)
 [facility] H&P  Subjective Chief Complaint: Christina [name] presents for a preoperative encounter.   History of Present Illness: Christina [name] is a 58 y.o. female who presents for preoperative visit.  She is scheduled to undergo Exam under anesthesia, anterior repair, posterior repair, perineoplasty, injection of urethral bulking agent, and cystoscopy on 03/14/24.  Her symptoms include pelvic organ prolapse and stress urinary incontinence, and she was was found to have Stage II anterior, Stage III posterior, Stage I apical prolapse.   Urodynamics showed: 1. Sensation was increased; capacity was normal 2. Stress Incontinence was demonstrated at normal pressures; 3. 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 4. Emptying was dysfunctional with a normal PVR, a sustained detrusor contraction present,  abdominal straining present, dyssynergic urethral sphincter activity on EMG.  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,  multifocal IDC, DCIS, ER/PR+,  01-09-2016 s/p bilteral mastectomies w/ left sln bx;  no chemoradiation   Cataracts, bilateral    Depression    Diabetes mellitus without complication (HCC)    type 2   GAD (generalized anxiety disorder)    Gallbladder problem    History of ovarian cyst    IBS (irritable bowel syndrome)    Joint pain    OSA (obstructive sleep apnea) 09/08/2023   PONV (postoperative nausea and vomiting)    does well with scop patch   Retinal detachment    Rheg OS   Right knee meniscal tear    Urgency of urination      Past Surgical History:  Procedure Laterality Date   ACHILLES TENDON REPAIR Right 2010;  revision 2011   BILATERAL TOTAL MASTECTOMY WITH AXILLARY LYMPH NODE DISSECTION     BLADDER SUSPENSION  2000   BREAST BIOPSY Left 10/2015   BREAST IMPLANT REMOVAL Bilateral 08/12/2017    Procedure: REMOVAL BILATERAL BREAST IMPLANTS;  Surgeon: Thornell Flirt, DO;  Location: [facility];  Service: Plastics;  Laterality: Bilateral;   BREAST RECONSTRUCTION WITH PLACEMENT OF TISSUE EXPANDER AND FLEX HD (ACELLULAR HYDRATED DERMIS) Bilateral 01/09/2016   BREAST RECONSTRUCTION WITH PLACEMENT OF TISSUE EXPANDER AND FLEX HD (ACELLULAR HYDRATED DERMIS) Bilateral 01/09/2016   Procedure: BREAST RECONSTRUCTION WITH PLACEMENT OF TISSUE EXPANDER AND FLEX HD (ACELLULAR HYDRATED DERMIS);  Surgeon: Lindaann Requena Dillingham, DO;  Location: [facility];  Service: Plastics;  Laterality: Bilateral;   BREAST RECONSTRUCTION WITH PLACEMENT OF TISSUE EXPANDER AND FLEX HD (ACELLULAR HYDRATED DERMIS) Bilateral 05/29/2016   Procedure: PLACEMENT OF BILATERAL TISSUE EXPANDER AND FLEX HD (ACELLULAR HYDRATED DERMIS);  Surgeon: Thornell Flirt, DO;  Location: [facility];  Service: Plastics;  Laterality: Bilateral;   BREAST REDUCTION SURGERY Bilateral 11/26/2016   Procedure: BILATERAL BREAST CAPSULE CONTRACTURE RELASE;  Surgeon: Thornell Flirt, DO;  Location: [facility];  Service: Plastics;  Laterality: Bilateral;   CESAREAN SECTION  1987; 1989; 1991   ELBOW SURGERY Right x3   last one 02-01-2019 @ [facility]   EYE SURGERY Left 10/01/2020   Pneumatic retinopexy for repair of rheg RD - Dr. Ronelle Coffee   EYE SURGERY Left 10/04/2020   PPV - Dr. Ronelle Coffee   FAT GRAFTING BILATERAL BREAST  08-09-2018  @[facility]    GAS INSERTION Left 10/04/2020   Procedure: INSERTION OF GAS;  Surgeon: Ronelle Coffee, MD;  Location: [facility] OR;  Service: Ophthalmology;  Laterality: Left;   GAS/FLUID EXCHANGE Left 10/04/2020   Procedure: GAS/FLUID EXCHANGE;  Surgeon: Ronelle Coffee, MD;  Location: [facility] OR;  Service: Ophthalmology;  Laterality: Left;   INCISION AND DRAINAGE OF WOUND Bilateral 02/11/2016   Procedure: IRRIGATION AND DEBRIDEMENT OF BILATERAL BREAST POCKET;  Surgeon: Thornell Flirt, DO;   Location: [facility];  Service: Plastics;  Laterality: Bilateral;   KNEE ARTHROSCOPY WITH MEDIAL MENISECTOMY Right 12/20/2019   Procedure: KNEE ARTHROSCOPY WITH MEDIAL MENISECTOMY;  Surgeon: Osa Blase, MD;  Location: [facility];  Service: Orthopedics;  Laterality: Right;   KNEE ARTHROSCOPY WITH MEDIAL MENISECTOMY Right 10/30/2021   Procedure: RIGHT KNEE ARTHROSCOPY WITH PARTIAL MEDIAL MENISECTOMY SYNOVECTOMY;  Surgeon: Wes Hamman, MD;  Location: [facility];  Service: Orthopedics;  Laterality: Right;   LAPAROSCOPIC APPENDECTOMY  04-07-2011   @[facility]    w/ Excision peritoneal lipoma and lysis adhesions   LAPAROSCOPIC CHOLECYSTECTOMY  ~ 1999   LASER PHOTO ABLATION Right 10/04/2020   Procedure: LASER RETINOPEXY WITH INDIRECT LASER OPTHALMOSCOPE, RIGHT EYE;  Surgeon: Ronelle Coffee, MD;  Location: [facility] OR;  Service: Ophthalmology;  Laterality: Right;   LIPOSUCTION WITH LIPOFILLING Bilateral 11/26/2016   Procedure: LIPOFILLING FOR SYMMETRY;  Surgeon: Thornell Flirt, DO;  Location: [facility];  Service: Plastics;  Laterality: Bilateral;   LIPOSUCTION WITH LIPOFILLING Bilateral 01/21/2017   Procedure: BILATERAL BREAST  LIPOFILLING FOR ASYMMETRY;  Surgeon: Thornell Flirt, DO;  Location: [facility];  Service: Plastics;  Laterality: Bilateral;   LIPOSUCTION WITH LIPOFILLING Bilateral 06/28/2020   Procedure: Lipofilling bilateral breasts for asymmetry;  Surgeon: Thornell Flirt, DO;  Location: [facility];  Service: Plastics;  Laterality: Bilateral;  90 min   MASTECTOMY Bilateral 01/09/2016   NIPPLE SPARING MASTECTOMY/SENTINAL LYMPH NODE BIOPSY/RECONSTRUCTION/PLACEMENT OF TISSUE EXPANDER Bilateral 01/09/2016   Procedure: BILATERAL NIPPLE SPARING MASTECTOMY WITH LEFT SENTINAL LYMPH NODE BIOPSY ;  Surgeon: Lockie Rima, MD;  Location: [facility];  Service: General;  Laterality: Bilateral;   PHOTOCOAGULATION WITH  LASER Left 10/04/2020   Procedure: PHOTOCOAGULATION WITH LASER;  Surgeon: Ronelle Coffee, MD;  Location: [facility] OR;  Service: Ophthalmology;  Laterality: Left;   REMOVAL OF BILATERAL TISSUE EXPANDERS WITH PLACEMENT OF BILATERAL BREAST IMPLANTS Bilateral 08/20/2016   Procedure: REMOVAL OF BILATERAL TISSUE EXPANDERS WITH PLACEMENT OF BILATERAL SILICONE IMPLANTS;  Surgeon: Thornell Flirt, DO;  Location: [facility];  Service: Plastics;  Laterality: Bilateral;   REMOVAL OF BILATERAL TISSUE EXPANDERS WITH PLACEMENT OF BILATERAL BREAST IMPLANTS Bilateral 11/05/2017   Procedure: REMOVAL OF BILATERAL TISSUE EXPANDERS WITH PLACEMENT OF BILATERAL BREAST SILICONE IMPLANTS;  Surgeon: Thornell Flirt, DO;  Location: [facility];  Service: Plastics;  Laterality: Bilateral;   REMOVAL OF TISSUE EXPANDER Bilateral 02/11/2016   Procedure: REMOVAL OF BILATERAL TISSUE EXPANDERS AND FLEX HD REMOVAL;  Surgeon: Thornell Flirt, DO;  Location: [facility];  Service: Plastics;  Laterality: Bilateral;   RETINAL DETACHMENT SURGERY Left 10/01/2020   Pneumatic retinopexy for repair of rheg RD - Dr. Ronelle Coffee   RETINAL DETACHMENT SURGERY Left 10/04/2020   PPV - Dr. Ronelle Coffee   SCLERAL BUCKLE Left 10/04/2020   Procedure: SCLERAL BUCKLE LEFT EYE;  Surgeon: Ronelle Coffee, MD;  Location: [facility] OR;  Service: Ophthalmology;  Laterality: Left;   TISSUE EXPANDER PLACEMENT Bilateral 08/12/2017   Procedure: PLACEMENT OF BILATERAL TISSUE EXPANDER;  Surgeon: Thornell Flirt, DO;  Location:  SURGERY CENTER;  Service: Plastics;  Laterality:  Bilateral;   TOTAL ABDOMINAL HYSTERECTOMY  05/1996   endometriosis   TOTAL KNEE ARTHROPLASTY Right 03/31/2022   Procedure: RIGHT TOTAL KNEE REPLACEMENT;  Surgeon: Wes Hamman, MD;  Location: [facility];  Service: Orthopedics;  Laterality: Right;   TUBAL LIGATION Bilateral 1991   VITRECTOMY 25 GAUGE WITH SCLERAL BUCKLE Left 10/04/2020    Procedure: 25 GAUGE PARS PLANA VITRECTOMY LEFT EYE ;  Surgeon: Ronelle Coffee, MD;  Location: [facility] OR;  Service: Ophthalmology;  Laterality: Left;    has no known allergies.   Family History  Problem Relation Age of Onset   Colon polyps Mother        approx 2   Other Mother        hx HPV and hysterectomy due to precancerous cells   Depression Mother    Anxiety disorder Mother    Heart failure Father    Prostate cancer Father 76   Retinal detachment Father    Allergic rhinitis Sister    Other Sister 93       hx of hysterectomy for unspecified reason; still has ovaries   Other Sister 55       paternal half-sister hx of hysterectomy for unspecified reason; still has ovaries   Kidney failure Maternal Grandmother    Congestive Heart Failure Maternal Grandmother    Colon cancer Maternal Grandmother 76   Diabetes Maternal Grandmother    Lung cancer Maternal Grandfather 86       smoker   Breast cancer Paternal Grandmother        dx. early 71s; w/ hx of trauma to breast   Crohn's disease Daughter    Bladder Cancer Maternal Uncle 53       not a smoker   Lung cancer Maternal Uncle 37       smoker   Liver disease Neg Hx    Esophageal cancer Neg Hx    Uterine cancer Neg Hx    Renal cancer Neg Hx     Social History   Tobacco Use   Smoking status: Former    Current packs/day: 0.00    Average packs/day: 1 pack/day for 10.0 years (10.0 ttl pk-yrs)    Types: Cigarettes    Start date: 06/08/1998    Quit date: 06/08/2008    Years since quitting: 15.7    Passive exposure: Never   Smokeless tobacco: Never  Vaping Use   Vaping status: Never Used  Substance Use Topics   Alcohol use: No   Drug use: Never     Review of Systems was negative for a full 10 system review except as noted in the History of Present Illness.  No current facility-administered medications for this encounter.  Current Outpatient Medications:    acetaminophen  (TYLENOL ) 500 MG tablet, Take 1 tablet (500 mg  total) by mouth every 6 (six) hours as needed (pain)., Disp: 30 tablet, Rfl: 0   enoxaparin (LOVENOX) 40 MG/0.4ML injection, Inject 0.4 mLs (40 mg total) into the skin daily., Disp: 11.2 mL, Rfl: 0   estradiol  (ESTRACE ) 0.1 MG/GM vaginal cream, Place 0.5 g vaginally 2 (two) times a week. Place 0.5g nightly for two weeks then twice a week after, Disp: 42.5 g, Rfl: 11   hydrochlorothiazide  (HYDRODIURIL ) 12.5 MG tablet, Take 1 tablet (12.5 mg total) by mouth daily., Disp: 90 tablet, Rfl: 1   hydrOXYzine  (ATARAX ) 50 MG tablet, Take 1/2 to 1 tablet (25 to 50 mg total) every 8 hours as needed for breakthrough anxiety., Disp: 30 tablet, Rfl:  1   ibuprofen  (ADVIL ) 600 MG tablet, Take 1 tablet (600 mg total) by mouth every 6 (six) hours as needed., Disp: 30 tablet, Rfl: 0   metFORMIN  (GLUCOPHAGE ) 500 MG tablet, Take 1 tablet (500 mg total) by mouth daily with breakfast., Disp: 90 tablet, Rfl: 3   oxyCODONE  (OXY IR/ROXICODONE ) 5 MG immediate release tablet, Take 1 tablet (5 mg total) by mouth every 4 (four) hours as needed for severe pain (pain score 7-10)., Disp: 15 tablet, Rfl: 0   polyethylene glycol powder (GLYCOLAX/MIRALAX) 17 GM/SCOOP powder, Take 17 g by mouth daily. Drink 17g (1 scoop) dissolved in water  per day., Disp: 510 g, Rfl: 0   RESTASIS  0.05 % ophthalmic emulsion, Place 1 drop into both eyes., Disp: , Rfl:    Sodium Sulfate-Mag Sulfate-KCl (SUTAB ) [phone] MG TABS, Take 24 tablets by mouth as directed., Disp: 24 tablet, Rfl: 0   Vibegron  (GEMTESA ) 75 MG TABS, Take 1 tablet (75 mg total) by mouth daily. LOT: [id number] EXP: 09/2027, Disp: , Rfl:    Objective There were no vitals filed for this visit.   Gen: NAD CV: S1 S2 RRR Lungs: Clear to auscultation bilaterally Abd: soft, nontender   Previous Pelvic Exam showed: POP-Q:    POP-Q   -1                                            Aa   -1                                           Ba   -6                                               C    5                                            Gh   5                                            Pb   8.5                                            tvl    1.5                                            Ap   1.5                                            Bp  Assessment/ Plan  Assessment: The patient is a 58 y.o. year old scheduled to undergo  Exam under anesthesia, anterior repair, posterior repair, perineoplasty, injection of urethral bulking agent, and cystoscopy . Verbal consent was obtained for these procedures.

## 2024-02-25 NOTE — Progress Notes (Signed)
 [facility] Pre-Operative Exam  Subjective Chief Complaint: Christina [name] presents for a preoperative encounter.   History of Present Illness: Christina [name] is a 58 y.o. female who presents for preoperative visit.  She is scheduled to undergo Exam under anesthesia, anterior repair, posterior repair, perineoplasty, injection of urethral bulking agent, and cystoscopy on 03/14/24.  Her symptoms include pelvic organ prolapse and stress urinary incontinence, and she was was found to have Stage II anterior, Stage III posterior, Stage I apical prolapse.   Urodynamics showed: 1. Sensation was increased; capacity was normal 2. Stress Incontinence was demonstrated at normal pressures; 3. 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 4. Emptying was dysfunctional with a normal PVR, a sustained detrusor contraction present,  abdominal straining present, dyssynergic urethral sphincter activity on EMG.  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,  multifocal IDC, DCIS, ER/PR+,  01-09-2016 s/p bilteral mastectomies w/ left sln bx;  no chemoradiation   Cataracts, bilateral    Depression    Diabetes mellitus without complication (HCC)    type 2   GAD (generalized anxiety disorder)    Gallbladder problem    History of ovarian cyst    IBS (irritable bowel syndrome)    Joint pain    OSA (obstructive sleep apnea) 09/08/2023   PONV (postoperative nausea and vomiting)    does well with scop patch   Retinal detachment    Rheg OS   Right knee meniscal tear    Urgency of urination      Past Surgical History:  Procedure Laterality Date   ACHILLES TENDON REPAIR Right 2010;  revision 2011   BILATERAL TOTAL MASTECTOMY WITH AXILLARY LYMPH NODE DISSECTION     BLADDER SUSPENSION  2000   BREAST BIOPSY Left 10/2015   BREAST IMPLANT REMOVAL Bilateral  08/12/2017   Procedure: REMOVAL BILATERAL BREAST IMPLANTS;  Surgeon: Thornell Flirt, DO;  Location: [facility];  Service: Plastics;  Laterality: Bilateral;   BREAST RECONSTRUCTION WITH PLACEMENT OF TISSUE EXPANDER AND FLEX HD (ACELLULAR HYDRATED DERMIS) Bilateral 01/09/2016   BREAST RECONSTRUCTION WITH PLACEMENT OF TISSUE EXPANDER AND FLEX HD (ACELLULAR HYDRATED DERMIS) Bilateral 01/09/2016   Procedure: BREAST RECONSTRUCTION WITH PLACEMENT OF TISSUE EXPANDER AND FLEX HD (ACELLULAR HYDRATED DERMIS);  Surgeon: Lindaann Requena Dillingham, DO;  Location: [facility];  Service: Plastics;  Laterality: Bilateral;   BREAST RECONSTRUCTION WITH PLACEMENT OF TISSUE EXPANDER AND FLEX HD (ACELLULAR HYDRATED DERMIS) Bilateral 05/29/2016   Procedure: PLACEMENT OF BILATERAL TISSUE EXPANDER AND FLEX HD (ACELLULAR HYDRATED DERMIS);  Surgeon: Thornell Flirt, DO;  Location: [facility];  Service: Plastics;  Laterality: Bilateral;   BREAST REDUCTION SURGERY Bilateral 11/26/2016   Procedure: BILATERAL BREAST CAPSULE CONTRACTURE RELASE;  Surgeon: Thornell Flirt, DO;  Location: [facility];  Service: Plastics;  Laterality: Bilateral;   CESAREAN SECTION  1987; 1989; 1991   ELBOW SURGERY Right x3   last one 02-01-2019 @ [facility]   EYE SURGERY Left 10/01/2020   Pneumatic retinopexy for repair of rheg RD - Dr. Ronelle Coffee   EYE SURGERY Left 10/04/2020   PPV - Dr. Ronelle Coffee   FAT GRAFTING BILATERAL BREAST  08-09-2018  @[facility]    GAS INSERTION Left 10/04/2020   Procedure: INSERTION OF GAS;  Surgeon: Ronelle Coffee, MD;  Location: [facility];  Service: Ophthalmology;  Laterality: Left;   GAS/FLUID EXCHANGE Left 10/04/2020   Procedure: GAS/FLUID EXCHANGE;  Surgeon: Ronelle Coffee, MD;  Location: [facility] OR;  Service: Ophthalmology;  Laterality: Left;   INCISION AND DRAINAGE OF WOUND Bilateral 02/11/2016   Procedure: IRRIGATION AND DEBRIDEMENT OF BILATERAL BREAST POCKET;  Surgeon: Thornell Flirt, DO;  Location: [facility];  Service: Plastics;  Laterality: Bilateral;   KNEE ARTHROSCOPY WITH MEDIAL MENISECTOMY Right 12/20/2019   Procedure: KNEE ARTHROSCOPY WITH MEDIAL MENISECTOMY;  Surgeon: Osa Blase, MD;  Location: [facility];  Service: Orthopedics;  Laterality: Right;   KNEE ARTHROSCOPY WITH MEDIAL MENISECTOMY Right 10/30/2021   Procedure: RIGHT KNEE ARTHROSCOPY WITH PARTIAL MEDIAL MENISECTOMY SYNOVECTOMY;  Surgeon: Wes Hamman, MD;  Location: [facility];  Service: Orthopedics;  Laterality: Right;   LAPAROSCOPIC APPENDECTOMY  04-07-2011   @[facility]    w/ Excision peritoneal lipoma and lysis adhesions   LAPAROSCOPIC CHOLECYSTECTOMY  ~ 1999   LASER PHOTO ABLATION Right 10/04/2020   Procedure: LASER RETINOPEXY WITH INDIRECT LASER OPTHALMOSCOPE, RIGHT EYE;  Surgeon: Ronelle Coffee, MD;  Location: [facility] OR;  Service: Ophthalmology;  Laterality: Right;   LIPOSUCTION WITH LIPOFILLING Bilateral 11/26/2016   Procedure: LIPOFILLING FOR SYMMETRY;  Surgeon: Thornell Flirt, DO;  Location: [facility];  Service: Plastics;  Laterality: Bilateral;   LIPOSUCTION WITH LIPOFILLING Bilateral 01/21/2017   Procedure: BILATERAL BREAST  LIPOFILLING FOR ASYMMETRY;  Surgeon: Thornell Flirt, DO;  Location: [facility];  Service: Plastics;  Laterality: Bilateral;   LIPOSUCTION WITH LIPOFILLING Bilateral 06/28/2020   Procedure: Lipofilling bilateral breasts for asymmetry;  Surgeon: Thornell Flirt, DO;  Location: [facility];  Service: Plastics;  Laterality: Bilateral;  90 min   MASTECTOMY Bilateral 01/09/2016   NIPPLE SPARING MASTECTOMY/SENTINAL LYMPH NODE BIOPSY/RECONSTRUCTION/PLACEMENT OF TISSUE EXPANDER Bilateral 01/09/2016   Procedure: BILATERAL NIPPLE SPARING MASTECTOMY WITH LEFT SENTINAL LYMPH NODE BIOPSY ;  Surgeon: Lockie Rima, MD;  Location: [facility];  Service: General;  Laterality: Bilateral;    PHOTOCOAGULATION WITH LASER Left 10/04/2020   Procedure: PHOTOCOAGULATION WITH LASER;  Surgeon: Ronelle Coffee, MD;  Location: [facility] OR;  Service: Ophthalmology;  Laterality: Left;   REMOVAL OF BILATERAL TISSUE EXPANDERS WITH PLACEMENT OF BILATERAL BREAST IMPLANTS Bilateral 08/20/2016   Procedure: REMOVAL OF BILATERAL TISSUE EXPANDERS WITH PLACEMENT OF BILATERAL SILICONE IMPLANTS;  Surgeon: Thornell Flirt, DO;  Location: [facility];  Service: Plastics;  Laterality: Bilateral;   REMOVAL OF BILATERAL TISSUE EXPANDERS WITH PLACEMENT OF BILATERAL BREAST IMPLANTS Bilateral 11/05/2017   Procedure: REMOVAL OF BILATERAL TISSUE EXPANDERS WITH PLACEMENT OF BILATERAL BREAST SILICONE IMPLANTS;  Surgeon: Thornell Flirt, DO;  Location: [facility];  Service: Plastics;  Laterality: Bilateral;   REMOVAL OF TISSUE EXPANDER Bilateral 02/11/2016   Procedure: REMOVAL OF BILATERAL TISSUE EXPANDERS AND FLEX HD REMOVAL;  Surgeon: Thornell Flirt, DO;  Location: [facility];  Service: Plastics;  Laterality: Bilateral;   RETINAL DETACHMENT SURGERY Left 10/01/2020   Pneumatic retinopexy for repair of rheg RD - Dr. Ronelle Coffee   RETINAL DETACHMENT SURGERY Left 10/04/2020   PPV - Dr. Ronelle Coffee   SCLERAL BUCKLE Left 10/04/2020   Procedure: SCLERAL BUCKLE LEFT EYE;  Surgeon: Ronelle Coffee, MD;  Location: [facility] OR;  Service: Ophthalmology;  Laterality: Left;   TISSUE EXPANDER PLACEMENT Bilateral 08/12/2017   Procedure: PLACEMENT OF BILATERAL TISSUE EXPANDER;  Surgeon: Thornell Flirt, DO;  Location: [facility];  Service: Plastics;  Laterality:  Bilateral;   TOTAL ABDOMINAL HYSTERECTOMY  05/1996   endometriosis   TOTAL KNEE ARTHROPLASTY Right 03/31/2022   Procedure: RIGHT TOTAL KNEE REPLACEMENT;  Surgeon: Wes Hamman, MD;  Location: [facility];  Service: Orthopedics;  Laterality: Right;   TUBAL LIGATION Bilateral 1991   VITRECTOMY 25 GAUGE WITH SCLERAL  BUCKLE Left 10/04/2020   Procedure: 25 GAUGE PARS PLANA VITRECTOMY LEFT EYE ;  Surgeon: Ronelle Coffee, MD;  Location: [facility] OR;  Service: Ophthalmology;  Laterality: Left;    has no known allergies.   Family History  Problem Relation Age of Onset   Colon polyps Mother        approx 2   Other Mother        hx HPV and hysterectomy due to precancerous cells   Depression Mother    Anxiety disorder Mother    Heart failure Father    Prostate cancer Father 28   Retinal detachment Father    Allergic rhinitis Sister    Other Sister 103       hx of hysterectomy for unspecified reason; still has ovaries   Other Sister 61       paternal half-sister hx of hysterectomy for unspecified reason; still has ovaries   Kidney failure Maternal Grandmother    Congestive Heart Failure Maternal Grandmother    Colon cancer Maternal Grandmother 26   Diabetes Maternal Grandmother    Lung cancer Maternal Grandfather 79       smoker   Breast cancer Paternal Grandmother        dx. early 77s; w/ hx of trauma to breast   Crohn's disease Daughter    Bladder Cancer Maternal Uncle 65       not a smoker   Lung cancer Maternal Uncle 37       smoker   Liver disease Neg Hx    Esophageal cancer Neg Hx    Uterine cancer Neg Hx    Renal cancer Neg Hx     Social History   Tobacco Use   Smoking status: Former    Current packs/day: 0.00    Average packs/day: 1 pack/day for 10.0 years (10.0 ttl pk-yrs)    Types: Cigarettes    Start date: 06/08/1998    Quit date: 06/08/2008    Years since quitting: 15.7    Passive exposure: Never   Smokeless tobacco: Never  Vaping Use   Vaping status: Never Used  Substance Use Topics   Alcohol use: No   Drug use: Never     Review of Systems was negative for a full 10 system review except as noted in the History of Present Illness.   Current Outpatient Medications:    estradiol  (ESTRACE ) 0.1 MG/GM vaginal cream, Place 0.5 g vaginally 2 (two) times a week. Place 0.5g  nightly for two weeks then twice a week after, Disp: 42.5 g, Rfl: 11   hydrochlorothiazide  (HYDRODIURIL ) 12.5 MG tablet, Take 1 tablet (12.5 mg total) by mouth daily., Disp: 90 tablet, Rfl: 1   hydrOXYzine  (ATARAX ) 50 MG tablet, Take 1/2 to 1 tablet (25 to 50 mg total) every 8 hours as needed for breakthrough anxiety., Disp: 30 tablet, Rfl: 1   metFORMIN  (GLUCOPHAGE ) 500 MG tablet, Take 1 tablet (500 mg total) by mouth daily with breakfast., Disp: 90 tablet, Rfl: 3   RESTASIS  0.05 % ophthalmic emulsion, Place 1 drop into both eyes., Disp: , Rfl:    Sodium Sulfate-Mag Sulfate-KCl (SUTAB ) [phone] MG TABS, Take 24 tablets by mouth as  directed., Disp: 24 tablet, Rfl: 0   Vibegron  (GEMTESA ) 75 MG TABS, Take 1 tablet (75 mg total) by mouth daily. LOT: [id number] EXP: 09/2027, Disp: , Rfl:    Objective Vitals:   02/25/24 1441  BP: 134/69  Pulse: (!) 59    Gen: NAD CV: S1 S2 RRR Lungs: Clear to auscultation bilaterally Abd: soft, nontender   Previous Pelvic Exam showed: POP-Q:    POP-Q   -1                                            Aa   -1                                           Ba   -6                                              C    5                                            Gh   5                                            Pb   8.5                                            tvl    1.5                                            Ap   1.5                                            Bp           Assessment/ Plan  Assessment: The patient is a 58 y.o. year old scheduled to undergo  Exam under anesthesia, anterior repair, posterior repair, perineoplasty, injection of urethral bulking agent, and cystoscopy . Verbal consent was obtained for these procedures.  Plan: General Surgical Consent: The patient has previously been counseled on alternative treatments, and the decision by the patient and provider was to proceed with the procedure listed above.  For all  procedures, there are risks of bleeding, infection, damage to surrounding organs including but not limited to bowel, bladder, blood vessels, ureters and nerves, and need for further surgery if an injury were to occur. These risks are all low with minimally invasive surgery.   There are risks of numbness and weakness at any body site or buttock/rectal pain.  It is possible that baseline  pain can be worsened by surgery, either with or without mesh. If surgery is vaginal, there is also a low risk of possible conversion to laparoscopy or open abdominal incision where indicated. Very rare risks include blood transfusion, blood clot, heart attack, pneumonia, or death.   There is also a risk of short-term postoperative urinary retention with need to use a catheter. About half of patients need to go home from surgery with a catheter, which is then later removed in the office. The risk of long-term need for a catheter is very low. There is also a risk of worsening of overactive bladder.    Prolapse (with or without mesh): Risk factors for surgical failure  include things that put pressure on your pelvis and the surgical repair, including obesity, chronic cough, and heavy lifting or straining (including lifting children or adults, straining on the toilet, or lifting heavy objects such as furniture or anything weighing >25 lbs. Risks of recurrence is 20-30% with vaginal native tissue repair and a less than 10% with sacrocolpopexy with mesh.    We discussed consent for blood products. Risks for blood transfusion include allergic reactions, other reactions that can affect different body organs and managed accordingly, transmission of infectious diseases such as HIV or Hepatitis. However, the blood is screened. Patient consents for blood products.  Pre-operative instructions:  She was instructed to not take Aspirin /NSAIDs x 7days prior to surgery. She may continue her 81mg  ASA. Antibiotic prophylaxis was ordered as  indicated.  Catheter use: Patient will go home with foley if needed after post-operative voiding trial.  Post-operative instructions:  She was provided with specific post-operative instructions, including precautions and signs/symptoms for which we would recommend contacting us , in addition to daytime and after-hours contact phone numbers. This was provided on a handout.   Post-operative medications: Prescriptions for motrin , tylenol , miralax, Lovenox and oxycodone  were sent to her pharmacy. Discussed using ibuprofen  and tylenol  on a schedule to limit use of narcotics.   Caprini score of 6 due to elevated risk of blood clot  Laboratory testing:  We will check labs: As requested by anesthesia   Preoperative clearance:  She does not require surgical clearance.    Post-operative follow-up:  A post-operative appointment will be made for 6 weeks from the date of surgery. If she needs a post-operative nurse visit for a voiding trial, that will be set up after she leaves the hospital.    Patient will call the clinic or use MyChart should anything change or any new issues arise.   Alexie Lanni G Markan Cazarez, NP

## 2024-02-26 NOTE — Telephone Encounter (Signed)
 [employer] rep named [place] stated that this is the second attempt that she has made to see if we are needing to still make an appeal for the patient to have an MRI. [place] is requesting a call back at [phone]. Please advise.

## 2024-02-29 NOTE — Telephone Encounter (Signed)
 Called [facility] and was informed the appeal apparently did not turn into an appeal, the case was escalated to 2-4 business days to the appeals dept.   Appeal#: [id number]

## 2024-03-01 ENCOUNTER — Other Ambulatory Visit: Payer: Self-pay | Admitting: Family Medicine

## 2024-03-01 DIAGNOSIS — F411 Generalized anxiety disorder: Secondary | ICD-10-CM

## 2024-03-01 NOTE — Telephone Encounter (Signed)
 Inbound call from [facility], need some further information, Need clinical notes that 2 or more family have  hx of pancreatic cancer or that patient has already had a Pancreatic ultrasound and needs the MRI in order to complete appeal.

## 2024-03-01 NOTE — Telephone Encounter (Signed)
 Are you able to set up a peer to peer? No family history of pancreatic cancer - this is the note Dr. San wrote last month regarding MRI denial:  San Sandor GAILS, DO to Me    01/27/24  8:06 AM Guidelines for screening for Pancreatic cancer with ATM gene mutation has become a bit of a gray area over the last couple of years.  The earlier NCCN guidelines had recommended screening should be for patients with ATM gene mutation and 1 first-degree relative.  However, the updated guidelines from this year recommend screening patients with ATM mutation for Pancreatic Cancer regardless of family history, starting at age 56.  This is in response to the elevated lifetime risk for Pancreatic Cancer in these individuals of 5-10% in most recent studies.  Based on these updated guidelines from NCCN, we have updated our protocol guidelines here, and request that her insurance review the most recent updated guidelines from 11/2023.

## 2024-03-02 ENCOUNTER — Encounter (HOSPITAL_COMMUNITY): Payer: Self-pay | Admitting: Obstetrics

## 2024-03-02 NOTE — Progress Notes (Signed)
 Spoke w/ via phone for pre-op interview--- Christina [name] needs dos----   BMP, EKG and CBG per anesthesia.      [name] results------Current A1C-7.0  dated 01/20/24 COVID test -----patient states asymptomatic no test needed Arrive at -------1045 NPO after MN NO Solid Food.  Clear liquids from MN until---0945 Pre-Surgery Ensure or G2:  Med rec completed Medications to take morning of surgery -----NONE Diabetic medication -----NONE AM of surgery  GLP1 agonist last dose: GLP1 instructions:  Patient instructed no nail polish to be worn day of surgery Patient instructed to bring photo id and insurance card day of surgery Patient aware to have Driver (ride ) / caregiver    for 24 hours after surgery - Husband [facility] Patient Special Instructions ----- shower with antibacterial soap. Pre-Op special Instructions -----  Patient verbalized understanding of instructions that were given at this phone interview. Patient denies chest pain, sob, fever, cough at the interview.

## 2024-03-07 ENCOUNTER — Encounter (HOSPITAL_COMMUNITY): Payer: Self-pay

## 2024-03-07 ENCOUNTER — Ambulatory Visit (HOSPITAL_COMMUNITY)
Admission: RE | Admit: 2024-03-07 | Discharge: 2024-03-07 | Disposition: A | Discharge status: Home / Self Care | Source: Ambulatory Visit | Attending: Plastic Surgery | Admitting: Plastic Surgery

## 2024-03-07 ENCOUNTER — Telehealth: Payer: Self-pay | Admitting: Plastic Surgery

## 2024-03-07 ENCOUNTER — Other Ambulatory Visit: Payer: Self-pay | Admitting: Student

## 2024-03-07 DIAGNOSIS — R222 Localized swelling, mass and lump, trunk: Secondary | ICD-10-CM

## 2024-03-07 NOTE — Addendum Note (Signed)
 Addended by: ANDRIS STAGGER on: 03/07/2024 02:35 PM   Modules accepted: Orders

## 2024-03-07 NOTE — Telephone Encounter (Signed)
 Patient is saying that she needs a new referral for her MRI, stating that it need to be with contrast.

## 2024-03-07 NOTE — Progress Notes (Signed)
 Updated MRI orders.

## 2024-03-10 ENCOUNTER — Ambulatory Visit (HOSPITAL_COMMUNITY): Admission: RE | Admit: 2024-03-10 | Source: Ambulatory Visit

## 2024-03-10 ENCOUNTER — Ambulatory Visit (HOSPITAL_COMMUNITY)
Admission: RE | Admit: 2024-03-10 | Discharge: 2024-03-10 | Disposition: A | Discharge status: Home / Self Care | Source: Ambulatory Visit | Attending: Student | Admitting: Student

## 2024-03-10 DIAGNOSIS — R222 Localized swelling, mass and lump, trunk: Secondary | ICD-10-CM | POA: Insufficient documentation

## 2024-03-10 MED ORDER — GADOBUTROL 1 MMOL/ML IV SOLN
10.0000 mL | Freq: Once | INTRAVENOUS | Status: AC | PRN
  Administered 2024-03-10: 10 mL via INTRAVENOUS

## 2024-03-10 NOTE — Telephone Encounter (Signed)
 Updated the orders on 6/30

## 2024-03-14 ENCOUNTER — Ambulatory Visit (HOSPITAL_COMMUNITY)

## 2024-03-14 ENCOUNTER — Ambulatory Visit (HOSPITAL_COMMUNITY)
Admission: RE | Admit: 2024-03-14 | Discharge: 2024-03-14 | Disposition: A | Discharge status: Home / Self Care | Attending: Obstetrics | Admitting: Obstetrics

## 2024-03-14 ENCOUNTER — Encounter (HOSPITAL_COMMUNITY)
Admission: RE | Disposition: A | Discharge status: Home / Self Care | Payer: Self-pay | Source: Home / Self Care | Attending: Obstetrics

## 2024-03-14 ENCOUNTER — Encounter (HOSPITAL_COMMUNITY): Payer: Self-pay | Admitting: Obstetrics

## 2024-03-14 ENCOUNTER — Other Ambulatory Visit: Payer: Self-pay

## 2024-03-14 DIAGNOSIS — Z7984 Long term (current) use of oral hypoglycemic drugs: Secondary | ICD-10-CM | POA: Insufficient documentation

## 2024-03-14 DIAGNOSIS — N329 Bladder disorder, unspecified: Secondary | ICD-10-CM | POA: Diagnosis not present

## 2024-03-14 DIAGNOSIS — N819 Female genital prolapse, unspecified: Secondary | ICD-10-CM

## 2024-03-14 DIAGNOSIS — Z87891 Personal history of nicotine dependence: Secondary | ICD-10-CM | POA: Diagnosis not present

## 2024-03-14 DIAGNOSIS — F419 Anxiety disorder, unspecified: Secondary | ICD-10-CM | POA: Diagnosis not present

## 2024-03-14 DIAGNOSIS — D649 Anemia, unspecified: Secondary | ICD-10-CM | POA: Insufficient documentation

## 2024-03-14 DIAGNOSIS — R351 Nocturia: Secondary | ICD-10-CM | POA: Insufficient documentation

## 2024-03-14 DIAGNOSIS — N393 Stress incontinence (female) (male): Secondary | ICD-10-CM | POA: Diagnosis not present

## 2024-03-14 DIAGNOSIS — N3946 Mixed incontinence: Secondary | ICD-10-CM | POA: Diagnosis not present

## 2024-03-14 DIAGNOSIS — N816 Rectocele: Secondary | ICD-10-CM | POA: Diagnosis present

## 2024-03-14 DIAGNOSIS — I10 Essential (primary) hypertension: Secondary | ICD-10-CM | POA: Insufficient documentation

## 2024-03-14 DIAGNOSIS — Z79899 Other long term (current) drug therapy: Secondary | ICD-10-CM | POA: Insufficient documentation

## 2024-03-14 DIAGNOSIS — Z9889 Other specified postprocedural states: Secondary | ICD-10-CM | POA: Diagnosis not present

## 2024-03-14 DIAGNOSIS — I459 Conduction disorder, unspecified: Secondary | ICD-10-CM | POA: Insufficient documentation

## 2024-03-14 DIAGNOSIS — Z01818 Encounter for other preprocedural examination: Secondary | ICD-10-CM

## 2024-03-14 DIAGNOSIS — E119 Type 2 diabetes mellitus without complications: Secondary | ICD-10-CM | POA: Diagnosis not present

## 2024-03-14 DIAGNOSIS — G4733 Obstructive sleep apnea (adult) (pediatric): Secondary | ICD-10-CM | POA: Diagnosis not present

## 2024-03-14 DIAGNOSIS — N813 Complete uterovaginal prolapse: Secondary | ICD-10-CM | POA: Insufficient documentation

## 2024-03-14 DIAGNOSIS — Z833 Family history of diabetes mellitus: Secondary | ICD-10-CM | POA: Insufficient documentation

## 2024-03-14 DIAGNOSIS — N3289 Other specified disorders of bladder: Secondary | ICD-10-CM | POA: Diagnosis not present

## 2024-03-14 DIAGNOSIS — M1990 Unspecified osteoarthritis, unspecified site: Secondary | ICD-10-CM | POA: Diagnosis not present

## 2024-03-14 DIAGNOSIS — N3281 Overactive bladder: Secondary | ICD-10-CM | POA: Diagnosis not present

## 2024-03-14 DIAGNOSIS — F32A Depression, unspecified: Secondary | ICD-10-CM | POA: Insufficient documentation

## 2024-03-14 HISTORY — PX: EXAM UNDER ANESTHESIA, PELVIC: SHX7461

## 2024-03-14 HISTORY — DX: Essential (primary) hypertension: I10

## 2024-03-14 HISTORY — PX: INJECTION, BULKING AGENT, URETHRA: SHX7596

## 2024-03-14 HISTORY — PX: PERINEOPLASTY: SHX2218

## 2024-03-14 HISTORY — PX: ANTERIOR AND POSTERIOR REPAIR: SHX5121

## 2024-03-14 HISTORY — PX: CYSTOSCOPY: SHX5120

## 2024-03-14 LAB — BASIC METABOLIC PANEL WITH GFR
Anion gap: 9 (ref 5–15)
BUN: 15 mg/dL (ref 6–20)
CO2: 23 mmol/L (ref 22–32)
Calcium: 9.9 mg/dL (ref 8.9–10.3)
Chloride: 110 mmol/L (ref 98–111)
Creatinine, Ser: 0.7 mg/dL (ref 0.44–1.00)
GFR, Estimated: >60 mL/min (ref >60)
Glucose, Bld: 117 mg/dL — ABNORMAL HIGH (ref 70–99)
Potassium: 3.9 mmol/L (ref 3.5–5.1)
Sodium: 142 mmol/L (ref 135–145)

## 2024-03-14 LAB — GLUCOSE, CAPILLARY
Glucose-Capillary: 106 mg/dL — ABNORMAL HIGH (ref 70–99)
Glucose-Capillary: 141 mg/dL — ABNORMAL HIGH (ref 70–99)

## 2024-03-14 SURGERY — ANTERIOR (CYSTOCELE) AND POSTERIOR REPAIR (RECTOCELE)
Anesthesia: General | Site: Vagina

## 2024-03-14 MED ORDER — SODIUM CHLORIDE 0.9 % IV SOLN
2.0000 g | INTRAVENOUS | Status: AC
  Administered 2024-03-14: 2 g via INTRAVENOUS

## 2024-03-14 MED ORDER — PHENAZOPYRIDINE HCL 100 MG PO TABS: 200.0000 mg | ORAL_TABLET | ORAL | Status: AC

## 2024-03-14 MED ORDER — ROCURONIUM BROMIDE 10 MG/ML (PF) SYRINGE
PREFILLED_SYRINGE | INTRAVENOUS | Status: AC
  Filled 2024-03-14: qty 10

## 2024-03-14 MED ORDER — MIDAZOLAM HCL 2 MG/2ML IJ SOLN
INTRAMUSCULAR | Status: AC
  Filled 2024-03-14: qty 2

## 2024-03-14 MED ORDER — GABAPENTIN 300 MG PO CAPS
ORAL_CAPSULE | ORAL | Status: AC
  Administered 2024-03-14: 300 mg via ORAL
  Filled 2024-03-14: qty 1

## 2024-03-14 MED ORDER — CHLORHEXIDINE GLUCONATE 0.12 % MT SOLN: 15.0000 mL | Freq: Once | OROMUCOSAL | Status: AC

## 2024-03-14 MED ORDER — SCOPOLAMINE 1 MG/3DAYS TD PT72
MEDICATED_PATCH | TRANSDERMAL | Status: AC
  Filled 2024-03-14: qty 1

## 2024-03-14 MED ORDER — STERILE WATER FOR IRRIGATION IR SOLN
Status: DC | PRN
  Administered 2024-03-14: 1500 mL

## 2024-03-14 MED ORDER — MIDAZOLAM HCL 2 MG/2ML IJ SOLN
INTRAMUSCULAR | Status: AC
Start: 2024-03-14 — End: 2024-03-14
  Filled 2024-03-14: qty 2

## 2024-03-14 MED ORDER — PHENYLEPHRINE HCL-NACL 20-0.9 MG/250ML-% IV SOLN
INTRAVENOUS | Status: DC | PRN
  Administered 2024-03-14: 20 ug/min via INTRAVENOUS

## 2024-03-14 MED ORDER — FENTANYL CITRATE (PF) 250 MCG/5ML IJ SOLN
INTRAMUSCULAR | Status: AC
  Filled 2024-03-14: qty 5

## 2024-03-14 MED ORDER — SODIUM CHLORIDE 0.9 % IV SOLN
INTRAVENOUS | Status: AC
  Filled 2024-03-14: qty 2

## 2024-03-14 MED ORDER — LIDOCAINE 2% (20 MG/ML) 5 ML SYRINGE
INTRAMUSCULAR | Status: AC
  Filled 2024-03-14: qty 5

## 2024-03-14 MED ORDER — SCOPOLAMINE 1 MG/3DAYS TD PT72
1.0000 | MEDICATED_PATCH | TRANSDERMAL | Status: DC
  Administered 2024-03-14: 1.5 mg via TRANSDERMAL

## 2024-03-14 MED ORDER — DEXMEDETOMIDINE HCL IN NACL 80 MCG/20ML IV SOLN
INTRAVENOUS | Status: AC
  Filled 2024-03-14: qty 20

## 2024-03-14 MED ORDER — SODIUM CHLORIDE 0.9 % IR SOLN
Status: DC | PRN
  Administered 2024-03-14: 600 mL via INTRAVESICAL

## 2024-03-14 MED ORDER — ACETAMINOPHEN 500 MG PO TABS
ORAL_TABLET | ORAL | Status: AC
  Administered 2024-03-14: 1000 mg via ORAL
  Filled 2024-03-14: qty 2

## 2024-03-14 MED ORDER — ONDANSETRON HCL 4 MG/2ML IJ SOLN
INTRAMUSCULAR | Status: DC | PRN
  Administered 2024-03-14: 4 mg via INTRAVENOUS

## 2024-03-14 MED ORDER — PHENAZOPYRIDINE HCL 100 MG PO TABS
ORAL_TABLET | ORAL | Status: AC
  Administered 2024-03-14: 200 mg via ORAL
  Filled 2024-03-14: qty 2

## 2024-03-14 MED ORDER — ACETAMINOPHEN 500 MG PO TABS: 1000.0000 mg | ORAL_TABLET | ORAL | Status: AC

## 2024-03-14 MED ORDER — MIDAZOLAM HCL 2 MG/2ML IJ SOLN
INTRAMUSCULAR | Status: DC | PRN
  Administered 2024-03-14: 4 mg via INTRAVENOUS

## 2024-03-14 MED ORDER — HYDROMORPHONE HCL 1 MG/ML IJ SOLN: 0.2500 mg | INTRAMUSCULAR | Status: DC | PRN

## 2024-03-14 MED ORDER — ENOXAPARIN SODIUM 40 MG/0.4ML IJ SOSY
PREFILLED_SYRINGE | INTRAMUSCULAR | Status: AC
  Administered 2024-03-14: 40 mg via SUBCUTANEOUS
  Filled 2024-03-14: qty 0.4

## 2024-03-14 MED ORDER — CHLORHEXIDINE GLUCONATE 0.12 % MT SOLN
OROMUCOSAL | Status: AC
  Administered 2024-03-14: 15 mL via OROMUCOSAL
  Filled 2024-03-14: qty 15

## 2024-03-14 MED ORDER — ORAL CARE MOUTH RINSE: 15.0000 mL | Freq: Once | OROMUCOSAL | Status: AC

## 2024-03-14 MED ORDER — SUGAMMADEX SODIUM 200 MG/2ML IV SOLN
INTRAVENOUS | Status: AC
  Filled 2024-03-14: qty 2

## 2024-03-14 MED ORDER — LACTATED RINGERS IV SOLN: INTRAVENOUS | Status: DC

## 2024-03-14 MED ORDER — FENTANYL CITRATE (PF) 250 MCG/5ML IJ SOLN
INTRAMUSCULAR | Status: DC | PRN
  Administered 2024-03-14: 100 ug via INTRAVENOUS
  Administered 2024-03-14: 50 ug via INTRAVENOUS

## 2024-03-14 MED ORDER — LIDOCAINE-EPINEPHRINE 1 %-1:100000 IJ SOLN
INTRAMUSCULAR | Status: AC
  Filled 2024-03-14: qty 1

## 2024-03-14 MED ORDER — LIDOCAINE-EPINEPHRINE 1 %-1:100000 IJ SOLN
INTRAMUSCULAR | Status: DC | PRN
  Administered 2024-03-14: 20 mL

## 2024-03-14 MED ORDER — ROCURONIUM BROMIDE 10 MG/ML (PF) SYRINGE
PREFILLED_SYRINGE | INTRAVENOUS | Status: DC | PRN
  Administered 2024-03-14: 60 mg via INTRAVENOUS
  Administered 2024-03-14: 20 mg via INTRAVENOUS

## 2024-03-14 MED ORDER — PHENYLEPHRINE 80 MCG/ML (10ML) SYRINGE FOR IV PUSH (FOR BLOOD PRESSURE SUPPORT)
PREFILLED_SYRINGE | INTRAVENOUS | Status: DC | PRN
Start: 2024-03-14 — End: 2024-03-14
  Administered 2024-03-14 (×2): 80 ug via INTRAVENOUS

## 2024-03-14 MED ORDER — PROPOFOL 10 MG/ML IV BOLUS
INTRAVENOUS | Status: DC | PRN
  Administered 2024-03-14: 200 mg via INTRAVENOUS

## 2024-03-14 MED ORDER — 0.9 % SODIUM CHLORIDE (POUR BTL) OPTIME
TOPICAL | Status: DC | PRN
  Administered 2024-03-14: 1000 mL

## 2024-03-14 MED ORDER — ENOXAPARIN SODIUM 40 MG/0.4ML IJ SOSY: 40.0000 mg | PREFILLED_SYRINGE | INTRAMUSCULAR | Status: AC

## 2024-03-14 MED ORDER — ONDANSETRON HCL 4 MG/2ML IJ SOLN
INTRAMUSCULAR | Status: AC
  Filled 2024-03-14: qty 2

## 2024-03-14 MED ORDER — DEXAMETHASONE SODIUM PHOSPHATE 10 MG/ML IJ SOLN
INTRAMUSCULAR | Status: DC | PRN
  Administered 2024-03-14: 4 mg via INTRAVENOUS

## 2024-03-14 MED ORDER — SUGAMMADEX SODIUM 200 MG/2ML IV SOLN
INTRAVENOUS | Status: DC | PRN
  Administered 2024-03-14: 400 mg via INTRAVENOUS

## 2024-03-14 MED ORDER — GABAPENTIN 300 MG PO CAPS: 300.0000 mg | ORAL_CAPSULE | ORAL | Status: AC

## 2024-03-14 MED ORDER — DEXMEDETOMIDINE HCL IN NACL 80 MCG/20ML IV SOLN
INTRAVENOUS | Status: DC | PRN
Start: 2024-03-14 — End: 2024-03-14
  Administered 2024-03-14: 8 ug via INTRAVENOUS

## 2024-03-14 MED ORDER — FENTANYL CITRATE (PF) 100 MCG/2ML IJ SOLN
INTRAMUSCULAR | Status: AC
  Filled 2024-03-14: qty 4

## 2024-03-14 SURGICAL SUPPLY — 34 items
BAG URINE DRAIN 2000ML AR STRL (UROLOGICAL SUPPLIES) IMPLANT
BLADE SURG 15 STRL LF DISP TIS (BLADE) ×5 IMPLANT
CATH FOLEY 2WAY SLVR 5CC 12FR (CATHETERS) IMPLANT
ELECTRODE REM PT RTRN 9FT ADLT (ELECTROSURGICAL) IMPLANT
GLOVE BIOGEL PI IND STRL 6 (GLOVE) ×5 IMPLANT
GLOVE BIOGEL PI IND STRL 7.0 (GLOVE) IMPLANT
GLOVE BIOGEL PI IND STRL 7.5 (GLOVE) IMPLANT
GLOVE BIOGEL PI MICRO STRL 5.5 (GLOVE) ×5 IMPLANT
GLOVE BIOGEL PI MICRO STRL 7 (GLOVE) IMPLANT
GLOVE SS PI 5.5 STRL (GLOVE) ×10 IMPLANT
GOWN STRL REUS W/ TWL LRG LVL3 (GOWN DISPOSABLE) ×5 IMPLANT
GOWN STRL REUS W/ TWL XL LVL3 (GOWN DISPOSABLE) IMPLANT
HIBICLENS CHG 4% 4OZ BTL (MISCELLANEOUS) ×5 IMPLANT
HOLDER FOLEY CATH W/STRAP (MISCELLANEOUS) IMPLANT
KIT TURNOVER KIT B (KITS) ×5 IMPLANT
MANIFOLD NEPTUNE II (INSTRUMENTS) ×5 IMPLANT
NDL HYPO 22X1.5 SAFETY MO (MISCELLANEOUS) ×5 IMPLANT
NEEDLE HYPO 22X1.5 SAFETY MO (MISCELLANEOUS) ×5 IMPLANT
NS IRRIG 1000ML POUR BTL (IV SOLUTION) ×5 IMPLANT
PACK VAGINAL WOMENS (CUSTOM PROCEDURE TRAY) ×5 IMPLANT
RETRACTOR LONE STAR DISPOSABLE (INSTRUMENTS) ×5 IMPLANT
RETRACTOR STAY HOOK 5MM (MISCELLANEOUS) ×5 IMPLANT
SET CYSTO W/LG BORE CLAMP LF (SET/KITS/TRAYS/PACK) ×5 IMPLANT
SLEEVE SCD COMPRESS KNEE MED (STOCKING) ×5 IMPLANT
SPIKE FLUID TRANSFER (MISCELLANEOUS) IMPLANT
SUT PDS AB 2-0 CT2 27 (SUTURE) ×5 IMPLANT
SUT VIC AB 2-0 SH 27XBRD (SUTURE) ×5 IMPLANT
SUT VICRYL 2-0 SH 8X27 (SUTURE) IMPLANT
SYR BULB EAR ULCER 3OZ GRN STR (SYRINGE) ×5 IMPLANT
SYSTEM URETHRAL BULK BULKAMID (Female Continence) IMPLANT
TOWEL GREEN STERILE (TOWEL DISPOSABLE) ×5 IMPLANT
TRAY FOLEY W/BAG SLVR 14FR (SET/KITS/TRAYS/PACK) IMPLANT
TUBE CONNECTING 12X1/4 (SUCTIONS) IMPLANT
WATER STERILE IRR 3000ML UROMA (IV SOLUTION) IMPLANT

## 2024-03-14 NOTE — Discharge Instructions (Addendum)
 POST OPERATIVE INSTRUCTIONS  General Instructions Recovery (not bed rest) will last approximately 6 weeks Walking is encouraged, but refrain from strenuous exercise/ housework/ heavy lifting. No lifting >10lbs  Nothing in the vagina- NO intercourse, tampons or douching Bathing:  Do not submerge in water  (NO swimming, bath, hot tub, etc) until after your postop visit. You can shower starting the day after surgery.  No driving until you are not taking narcotic pain medicine and until your pain is well enough controlled that you can slam on the breaks or make sudden movements if needed.   Taking your medications Please take your acetaminophen  and ibuprofen  on a schedule for the first 48 hours. Take 600mg  ibuprofen , then take 500mg  acetaminophen  3 hours later, then continue to alternate ibuprofen  and acetaminophen . That way you are taking each type of medication every 6 hours. Take the prescribed narcotic (oxycodone , tramadol , etc) as needed, with a maximum being every 4 hours.  Take a stool softener daily to keep your stools soft and preventing you from straining. If you have diarrhea, you decrease your stool softener. This is explained more below. We have prescribed you Miralax .  Reasons to Call the Nurse (see last page for phone numbers) Heavy Bleeding (changing your pad every 1-2 hours) Persistent nausea/vomiting Fever (100.4 degrees or more) Incision problems (pus or other fluid coming out, redness, warmth, increased pain)  Things to Expect After Surgery Mild to Moderate pain is normal during the first day or two after surgery. If prescribed, take Ibuprofen  or Tylenol  first and use the stronger medicine for "break-through" pain. You can overlap these medicines because they work differently.   Constipation   To Prevent Constipation:  Eat a well-balanced diet including protein, grains, fresh fruit and vegetables.  Drink plenty of fluids. Walk regularly.  Depending on specific instructions  from your physician: take Miralax  daily and additionally you can add a stool softener (colace/ docusate) and fiber supplement. Continue as long as you're on pain medications.   To Treat Constipation:  If you do not have a bowel movement in 2 days after surgery, you can take 2 Tbs of Milk of Magnesia 1-2 times a day until you have a bowel movement. If diarrhea occurs, decrease the amount or stop the laxative. If no results with Milk of Magnesia, you can drink a bottle of magnesium citrate which you can purchase over the counter.  Fatigue:  This is a normal response to surgery and will improve with time.  Plan frequent rest periods throughout the day.  Gas Pain:  This is very common but can also be very painful! Drink warm liquids such as herbal teas, bouillon or soup. Walking will help you pass more gas.  Mylicon or Gas-X can be taken over the counter.  Leaking Urine:  Varying amounts of leakage may occur after surgery.  This should improve with time. Your bladder needs at least 3 months to recover from surgery. If you leak after surgery, be sure to mention this to your doctor at your post-op visit. If you were taking medications for overactive bladder prior to surgery, be sure to restart the medications immediately after surgery.  Incisions: If you have incisions on your abdomen, the skin glue will dissolve on its own over time. It is ok to gently rinse with soap and water  over these incisions but do not scrub.  Catheter Approximately 50% of patients are unable to urinate after surgery and need to go home with a catheter. This allows your bladder to  rest so it can return to full function. If you go home with a catheter, the office will call to set up a voiding trial a few days after surgery. For most patients, by this visit, they are able to urinate on their own. Long term catheter use is rare.   Return to Work  As work demands and recovery times vary widely, it is hard to predict when you will want  to return to work. If you have a desk job with no strenuous physical activity, and if you would like to return sooner than generally recommended, discuss this with your provider or call our office.   Post op concerns  For non-emergent issues, please call the Urogynecology Nurse. Please leave a message and someone will contact you within one business day.  You can also send a message through MyChart.   AFTER HOURS (After 5:00 PM and on weekends):  For urgent matters that cannot wait until the next business day. Call our office [phone] and connect to the doctor on call.  Please reserve this for important issues.   **FOR ANY TRUE EMERGENCY ISSUES CALL 911 OR GO TO THE NEAREST EMERGENCY ROOM.** Please inform our office or the doctor on call of any emergency.     APPOINTMENTS: Call [phone]  Please start daily lovenox  injection to reduce risk of blood clots.        Post Anesthesia Home Care Instructions  Activity: Get plenty of rest for the remainder of the day. A responsible individual must stay with you for 24 hours following the procedure.  For the next 24 hours, DO NOT: -Drive a car -Advertising copywriter -Drink alcoholic beverages -Take any medication unless instructed by your physician -Make any legal decisions or sign important papers.  Meals: Start with liquid foods such as gelatin or soup. Progress to regular foods as tolerated. Avoid greasy, spicy, heavy foods. If nausea and/or vomiting occur, drink only clear liquids until the nausea and/or vomiting subsides. Call your physician if vomiting continues.  Special Instructions/Symptoms: Your throat may feel dry or sore from the anesthesia or the breathing tube placed in your throat during surgery. If this causes discomfort, gargle with warm salt water . The discomfort should disappear within 24 hours.  If you had a scopolamine  patch placed behind your ear for the management of post- operative nausea and/or  vomiting:  1. The medication in the patch is effective for 72 hours, after which it should be removed.  Wrap patch in a tissue and discard in the trash. Wash hands thoroughly with soap and water . 2. You may remove the patch earlier than 72 hours if you experience unpleasant side effects which may include dry mouth, dizziness or visual disturbances. 3. Avoid touching the patch. Wash your hands with soap and water  after contact with the patch.

## 2024-03-14 NOTE — Interval H&P Note (Signed)
 History and Physical Interval Note:  03/14/2024 1:15 PM  Christina [name]  has presented today for surgery, with the diagnosis of rectocele; overactive bladder; stress urinary incontinence; lesion of bladder.  The various methods of treatment have been discussed with the patient and family. After consideration of risks, benefits and other options for treatment, the patient has consented to  Procedure(s) with comments: ANTERIOR (CYSTOCELE) AND POSTERIOR REPAIR (RECTOCELE) (N/A) PERINEOPLASTY (N/A) - perineorrhaphy INJECTION, BULKING AGENT, URETHRA (N/A) CYSTOSCOPY (N/A) - bladder biopsy with fulguration EXAM UNDER ANESTHESIA, PELVIC COLPORRHAPHY, POSTERIOR, FOR RECTOCELE REPAIR as a surgical intervention.  The patient's history has been reviewed, patient examined, no change in status, stable for surgery.  I have reviewed the patient's chart and labs.  Questions were answered to the patient's satisfaction.     Vishnu Moeller ONEIDA Gillis

## 2024-03-14 NOTE — Anesthesia Postprocedure Evaluation (Signed)
 Anesthesia Post Note  Patient: Christina [name]  Procedure(s) Performed: ANTERIOR (CYSTOCELE) AND POSTERIOR REPAIR (RECTOCELE) (Vagina ) PERINEOPLASTY (Perineum) INJECTION, BULKING AGENT, URETHRA (Urethra) CYSTOSCOPY WITH BLADDER BIOPSY AND FULGURATION (Bladder) EXAM UNDER ANESTHESIA, PELVIC (Vagina )     Patient location during evaluation: PACU Anesthesia Type: General Level of consciousness: awake and alert Pain management: pain level controlled Vital Signs Assessment: post-procedure vital signs reviewed and stable Respiratory status: spontaneous breathing, nonlabored ventilation and respiratory function stable Cardiovascular status: blood pressure returned to baseline and stable Postop Assessment: no apparent nausea or vomiting Anesthetic complications: no   No notable events documented.  Last Vitals:  Vitals:   03/14/24 1700 03/14/24 1715  BP: 136/73 129/88  Pulse: (!) 59 63  Resp: 17 17  Temp:    SpO2: 100% 100%    Last Pain:  Vitals:   03/14/24 1536  TempSrc:   PainSc: Asleep                 Andriea Hasegawa,W. EDMOND

## 2024-03-14 NOTE — Op Note (Signed)
 Operative Note  Preoperative Diagnosis: Mixed urinary incontinence, stage III pelvic organ prolapse, bladder ulcers, nocturia, history of pelvic surgery  Postoperative Diagnosis: Mixed urinary incontinence, stage III pelvic organ prolapse, bladder ulcers, nocturia, history of pelvic surgery  Procedures performed:  Exam under anesthesia, anterior repair, posterior repair, perineoplasty, bladder biopsy and fulguration of bladder lesion, injection of urethral bulking agent, and cystoscopy   Implants:  Implant Name Type Inv. Item Serial No. Manufacturer Lot No. LRB No. Used Action  SYSTEM PHILL COLVIN LUEVENIA [employer] [id number] Female Continence SYSTEM URETHRAL [employer]IN [employer] [id number]  1 Implanted    Attending Surgeon: Lianne Leila Gillis, MD  Assistant Surgeon: n/a  Assistant: Jorene Moats, PA  Anesthesia: General endotracheal  Findings: 1. On vaginal exam, stage III posterior and stage II anterior vaginal wall prolapse present with well suspended apex.   2. On cystoscopy, 2 erythematous 2-57mm circular shallow ulcer on right and left posterior bladder mucosa.  Right circular shallow posterior bladder wall ulcer was biopsied with fulguration performed. Normal urethral mucosa without injury or lesion. Brisk bilateral ureteral efflux present.    Specimens:  ID Type Source Tests Collected by Time Destination  1 : right bladder dome biopsy Tissue PATH GU biopsy SURGICAL PATHOLOGY Gillis Lianne T, MD 03/14/2024 1420     Estimated blood loss: 50 mL  IV fluids: 1350 mL  Urine output: 200 mL  Complications: none  Procedure in Detail: After informed consent was obtained, the patient was taken to the operating room where she was placed under anesthesia.  She was then placed in the dorsal lithotomy position with Allen stirrups and prepped and draped in the usual sterile fashion.  Care was taken to avoid hyperflexion or hyperextension of her lower extremities.    A self-retaining retractor was  placed, and a foley catheter was placed. For the anterior repair, two Allis clamps were placed along the midline of the anterior vaginal wall.  1% lidocaine  with epinephrine  was injected into the vaginal mucosa.  A 15 blade scalpel was used to incise the vaginal mucosa in the midline. Allis clamps were placed along this incision and Metzenbaum scissors were used to sharply dissect the epithelium off of the vesicovaginal septum bilaterally to the level of the pubic rami. Anterior plication of the vesicovaginal septum was then performed using 2-0 PDS. The vaginal mucosal edges were trimmed and the incision reapproximated with 2-0 Vicryl in a running locked fashion. Hemostasis was noted. The Foley catheter was removed. A 70-degree cystoscope was introduced, and 360-degree inspection revealed no trauma in the bladder, with bilateral ureteral efflux. The bladder was drained.   Endoscopic bladder biopsy forceps were introduced under direct visualization.  The right circular shallow posterior bladder wall ulcer was biopsied and sent to pathology with the cystoscope removed. The biopsy site was fulgurated with a Bugbee for hemostasis after the cystoscope was reintroduced under direct visualization and the bladder was distended with sterile water . The bladder was decompressed and refilled to reassess hemostasis.  The bladder was drained and the cystoscope was removed.    The Bulkamid needle was primed.  The Bulkamid urethroscope was inserted to the level of the bladder neck.  The needle was inserted 2 cm and the scope was pulled back into the urethra 2 cm.  The needle was inserted bevel up at the 5 o'clock position and the Bulkamid was injected to obtain coaptation.  This was repeated at the 2 o'clock,  10 o'clock and 7 o'clock positions.   A  total of 2- 1ml syringes were used and good circumferential coaptation was noted. A 12 Fr Foley catheter was reinserted.  Attention was then turned to the posterior vagina.  Two  Allis clamps were placed at the introitus approximately 3cm from the urethra meatus. 1% lidocaine  with epinephrine  was injected into the vaginal mucosa in the posterior vaginal wall and perineum for hydrodissection and hemostasis. A vertical incision was made between these clamps with a 15 blade scalpel and a diamond shaped area of epithelium was cut at the introitus.  The rectovaginal septum was then dissected off the vaginal mucosa bilaterally. The rectovaginal septum was then plicated in a continuous running fashion with 2-0 PDS while one finger was placed in the rectum to prevent rectal penetration.  After placement of the first plication stitch two fingers were inserted into the vaginal to confirm adequate caliber.  The suture incorporated the perineal body in a U stitch fashion and the bulbocavernosus muscles. A 2-0 Vicryl was used in a subcuticular fashion to re-approximate the hymenal ring. After plication, the excess vaginal mucosa was trimmed and the vaginal mucosa was reapproximated using 2-0 Vicryl sutures.  The vagina was copiously irrigated.  An additional 2-0 Vicryl suture was placed along the anterior vaginal wall for additional hemostasis. The vagina was irrigated and hemostasis was noted. A rectal examination was normal and confirmed no sutures within the rectum. Three fingers passed through the vaginal opening without difficulty.  The patient tolerated the procedure well.  She was awakened from anesthesia and transferred to the recovery room in stable condition. All needle and sponge counts were correct x 2.

## 2024-03-14 NOTE — Progress Notes (Signed)
 Results of voiding trial: 250 ml instilled 225 ml voided Bladder scan 0 ml x 2

## 2024-03-14 NOTE — Transfer of Care (Signed)
 Immediate Anesthesia Transfer of Care Note  Patient: Christina [name]  Procedure(s) Performed: ANTERIOR (CYSTOCELE) AND POSTERIOR REPAIR (RECTOCELE) (Vagina ) PERINEOPLASTY (Perineum) INJECTION, BULKING AGENT, URETHRA (Urethra) CYSTOSCOPY WITH BLADDER BIOPSY AND FULGURATION (Bladder) EXAM UNDER ANESTHESIA, PELVIC (Vagina )  Patient Location: PACU  Anesthesia Type:General  Level of Consciousness: drowsy  Airway & Oxygen Therapy: Patient Spontanous Breathing and Patient connected to face mask oxygen  Post-op Assessment: Report given to RN and Post -op Vital signs reviewed and stable  Post vital signs: Reviewed and stable  Last Vitals:  Vitals Value Taken Time  BP 128/71 03/14/24 15:35  Temp    Pulse 65 03/14/24 15:38  Resp 25 03/14/24 15:38  SpO2 94 % 03/14/24 15:38  Vitals shown include unfiled device data.  Last Pain:  Vitals:   03/14/24 1111  TempSrc: Oral  PainSc: 0-No pain         Complications: No notable events documented.

## 2024-03-14 NOTE — Anesthesia Procedure Notes (Signed)
 Procedure Name: Intubation Date/Time: 03/14/2024 1:30 PM  Performed by: Viviana Almarie DASEN, CRNAPre-anesthesia Checklist: Patient identified, Emergency Drugs available, Suction available and Patient being monitored Patient Re-evaluated:Patient Re-evaluated prior to induction Oxygen Delivery Method: Circle system utilized Preoxygenation: Pre-oxygenation with 100% oxygen Induction Type: IV induction Ventilation: Mask ventilation without difficulty Tube type: Oral Tube size: 7.0 mm Number of attempts: 2 Airway Equipment and Method: Stylet, Oral airway and Bite block Placement Confirmation: ETT inserted through vocal cords under direct vision, positive ETCO2 and breath sounds checked- equal and bilateral Secured at: 21 cm Tube secured with: Tape Dental Injury: Teeth and Oropharynx as per pre-operative assessment

## 2024-03-14 NOTE — Anesthesia Preprocedure Evaluation (Addendum)
 Anesthesia Evaluation  Patient identified by MRN, date of birth, ID band Patient awake    Reviewed: Allergy  & Precautions, H&P , NPO status , Patient's Chart, lab work & pertinent test results  History of Anesthesia Complications (+) PONV and history of anesthetic complications  Airway Mallampati: II  TM Distance: >3 FB Neck ROM: Full    Dental no notable dental hx. (+) Edentulous Upper, Dental Advisory Given   Pulmonary sleep apnea and Continuous Positive Airway Pressure Ventilation , former smoker   Pulmonary exam normal breath sounds clear to auscultation       Cardiovascular hypertension, Pt. on medications  Rhythm:Regular Rate:Normal     Neuro/Psych   Anxiety Depression    negative neurological ROS     GI/Hepatic negative GI ROS, Neg liver ROS,,,  Endo/Other  diabetes, Type 2, Oral Hypoglycemic Agents    Renal/GU negative Renal ROS  negative genitourinary   Musculoskeletal  (+) Arthritis , Osteoarthritis,    Abdominal   Peds  Hematology  (+) Blood dyscrasia, anemia   Anesthesia Other Findings   Reproductive/Obstetrics negative OB ROS                              Anesthesia Physical Anesthesia Plan  ASA: 3  Anesthesia Plan: General   Post-op Pain Management: Tylenol  PO (pre-op)*   Induction: Intravenous  PONV Risk Score and Plan: 4 or greater and Ondansetron , Dexamethasone  and Midazolam   Airway Management Planned: Oral ETT  Additional Equipment:   Intra-op Plan:   Post-operative Plan: Extubation in OR  Informed Consent: I have reviewed the patients History and Physical, chart, labs and discussed the procedure including the risks, benefits and alternatives for the proposed anesthesia with the patient or authorized representative who has indicated his/her understanding and acceptance.     Dental advisory given  Plan Discussed with: CRNA  Anesthesia Plan Comments:           Anesthesia Quick Evaluation

## 2024-03-15 ENCOUNTER — Telehealth: Payer: Self-pay | Admitting: Obstetrics

## 2024-03-15 ENCOUNTER — Encounter (HOSPITAL_COMMUNITY): Payer: Self-pay | Admitting: Obstetrics

## 2024-03-15 NOTE — Telephone Encounter (Signed)
 Exam under anesthesia, anterior repair, posterior repair, perineoplasty, bladder biopsy and fulguration of bladder lesion, injection of urethral bulking agent, and cystoscopy on 03/14/24 Passed void trial.  Please call to review lovenox  use and postop call.

## 2024-03-15 NOTE — Telephone Encounter (Signed)
 Christina [name]  underwent Anterior (cystocele) And Posterior Repair (rectocele), Perineoplasty, Injection, Bulking Agent, Urethra, Cystoscopy With Bladder Biopsy And Fulguration, and Exam Under Anesthesia, Pelvic  on 03/14/2024  with [] Dr Marilynne [x] Dr Guadlupe.  The patient reports that her pain is controlled.  She is taking [] No Medication [x] Acetaminophen  500mg  every 6 hours [x] Ibuprofen  600mg  every 6 hours or []  Prescribed Narcotic.  Her pain level is 9[] with medication [x] Without medication. With Tylenol  and ibuprofen  she states her pain is a 0.She has not needed the narcotic medication yet.  She denies vaginal bleeding.  The patient is tolerating PO fluids and solids. She has not had a bowel movement and is taking Miralax  for a bowel regimen. She is passing gas.  She was discharged without a catheter.   [x]  Discharged without a catheter, the patient does feel as if she is emptying her bladder.  Patient has not yet picked uo her Lovenox  from her pharmacy. Patient will call today and pick, so she has it for next week. She was given instructions upon discharge at the hospital.   She does not having any additional questions.  Reviewed Post operative instructions as needed to answer additional questions.   CC'd note to patient's provider.

## 2024-03-16 LAB — SURGICAL PATHOLOGY

## 2024-03-17 ENCOUNTER — Ambulatory Visit: Admitting: Family Medicine

## 2024-03-17 ENCOUNTER — Ambulatory Visit: Payer: Self-pay | Admitting: Obstetrics

## 2024-03-18 ENCOUNTER — Telehealth: Admitting: Plastic Surgery

## 2024-03-18 ENCOUNTER — Encounter: Payer: Self-pay | Admitting: Plastic Surgery

## 2024-03-18 DIAGNOSIS — Z170 Estrogen receptor positive status [ER+]: Secondary | ICD-10-CM

## 2024-03-18 DIAGNOSIS — D1722 Benign lipomatous neoplasm of skin and subcutaneous tissue of left arm: Secondary | ICD-10-CM

## 2024-03-18 NOTE — Progress Notes (Signed)
   Subjective:    Patient ID: Christina [name], female    DOB: [birth year], 58 y.o.   MRN: [id number]  The patient is a 58 year old female joining me by phone for further discussion about the MRI she had at the beginning of the month.  It does show what looks to be a lipoma in the left posterior shoulder.  Patient says it seems to be getting bigger and is becoming more obvious.  She would like to get it taken care of.      Review of Systems  Constitutional: Negative.   Eyes: Negative.   Respiratory: Negative.    Cardiovascular: Negative.   Gastrointestinal: Negative.   Endocrine: Negative.        Objective:   Physical Exam      Assessment & Plan:     ICD-10-CM   1. Malignant neoplasm of central portion of left breast in female, estrogen receptor positive (HCC)  C50.112    Z17.0       I connected with  Joshalyn Ancheta Pata on 03/18/24 by phone and verified that I am speaking with the correct person using two identifiers.  The patient is in [place]  and I was at the office.  We spent 5 minutes in discussion.  Plan will be for excision of left shoulder posterior lipoma.   I discussed the limitations of evaluation and management by telemedicine. The patient expressed understanding and agreed to proceed.   Plan for excision of left shoulder lipoma

## 2024-03-19 DIAGNOSIS — Z419 Encounter for procedure for purposes other than remedying health state, unspecified: Secondary | ICD-10-CM | POA: Diagnosis not present

## 2024-03-25 ENCOUNTER — Telehealth: Payer: Self-pay | Admitting: Plastic Surgery

## 2024-03-25 NOTE — Telephone Encounter (Signed)
 error

## 2024-03-28 ENCOUNTER — Ambulatory Visit: Admitting: Family Medicine

## 2024-03-29 DIAGNOSIS — G4733 Obstructive sleep apnea (adult) (pediatric): Secondary | ICD-10-CM | POA: Diagnosis not present

## 2024-03-31 ENCOUNTER — Encounter: Admitting: Physician Assistant

## 2024-04-01 ENCOUNTER — Other Ambulatory Visit: Payer: Self-pay

## 2024-04-01 ENCOUNTER — Telehealth: Payer: Self-pay

## 2024-04-01 ENCOUNTER — Telehealth: Payer: Self-pay | Admitting: Gastroenterology

## 2024-04-01 ENCOUNTER — Encounter: Admitting: Physician Assistant

## 2024-04-01 MED ORDER — SUTAB 1479-225-188 MG PO TABS: ORAL_TABLET | ORAL | 0 refills | Status: DC

## 2024-04-01 NOTE — Telephone Encounter (Signed)
 Inbound call from patient stating she was never sent her prep medication for upcoming procedure on 7/29. Requesting a call back  Please advise  Thank you

## 2024-04-01 NOTE — Addendum Note (Signed)
 Addended by: BENJAMINE NAT DEL on: 04/01/2024 04:13 PM   Modules accepted: Orders

## 2024-04-01 NOTE — Telephone Encounter (Signed)
 Patient called and stated that she has not received her prep medication as of yet and does not have her prep instruction either. Patient is requesting that we send her, her prep instruction to her mychart and her prep over to the pharmacy. Patient is requesting a call back. .Please advise.

## 2024-04-01 NOTE — Telephone Encounter (Signed)
 RN returned patient call d/t patient requesting the need for colonoscopy prep instructions as well as the need for her prep medication to be sent to her pharmacy.  Upon calling patient, phone went immediately to patient vm. RN left message letting patient know she was returning her call and that if someone has not already contacted her to resolve her needs, patient should call back before 4:30 today, Friday, 04/01/24.  Of note, RN did not locate an [facility] pre-visit prior to her upcoming procedure on 04/05/24.

## 2024-04-01 NOTE — Telephone Encounter (Signed)
 RN returned patient call d/t her request for colonoscopy prep instructions needed, as well as request for prep medication to be sent to patient's pharmacy.  Upon calling, patient phone went immediately to vm. RN left message letting patient know that she was returning her call. RN instructed patient that if she has not already talked with someone from [facility] to resolve her needs that she please call back at our main phone number before 4:30 today, Friday, 04/01/24.

## 2024-04-01 NOTE — Telephone Encounter (Signed)
 Rx for Subtab resent to pharmacy.  Sutab  (2day prep) instructions sent to patient MyChart.   Attempted to reach patient by phone to make her aware that prep had been resent and instructions sent to her MyChart, but it went straight to voicemail and the voicemail box was full.

## 2024-04-05 ENCOUNTER — Ambulatory Visit: Admitting: Gastroenterology

## 2024-04-05 ENCOUNTER — Encounter: Payer: Self-pay | Admitting: Gastroenterology

## 2024-04-05 VITALS — BP 114/66 | HR 61 | Temp 97.8°F | Resp 14 | Ht 67.0 in | Wt 219.0 lb

## 2024-04-05 DIAGNOSIS — E119 Type 2 diabetes mellitus without complications: Secondary | ICD-10-CM | POA: Diagnosis not present

## 2024-04-05 DIAGNOSIS — G4733 Obstructive sleep apnea (adult) (pediatric): Secondary | ICD-10-CM | POA: Diagnosis not present

## 2024-04-05 DIAGNOSIS — F411 Generalized anxiety disorder: Secondary | ICD-10-CM | POA: Diagnosis not present

## 2024-04-05 DIAGNOSIS — Z860100 Personal history of colon polyps, unspecified: Secondary | ICD-10-CM

## 2024-04-05 DIAGNOSIS — K5730 Diverticulosis of large intestine without perforation or abscess without bleeding: Secondary | ICD-10-CM

## 2024-04-05 DIAGNOSIS — K635 Polyp of colon: Secondary | ICD-10-CM | POA: Diagnosis not present

## 2024-04-05 DIAGNOSIS — Z1509 Genetic susceptibility to other malignant neoplasm: Secondary | ICD-10-CM

## 2024-04-05 DIAGNOSIS — Z800 Family history of malignant neoplasm of digestive organs: Secondary | ICD-10-CM | POA: Diagnosis not present

## 2024-04-05 DIAGNOSIS — D122 Benign neoplasm of ascending colon: Secondary | ICD-10-CM | POA: Diagnosis not present

## 2024-04-05 DIAGNOSIS — F32A Depression, unspecified: Secondary | ICD-10-CM | POA: Diagnosis not present

## 2024-04-05 DIAGNOSIS — Z1211 Encounter for screening for malignant neoplasm of colon: Secondary | ICD-10-CM

## 2024-04-05 MED ORDER — SODIUM CHLORIDE 0.9 % IV SOLN: 500.0000 mL | Freq: Once | INTRAVENOUS | Status: DC

## 2024-04-05 NOTE — Op Note (Signed)
 [facility] Patient Name: Christina [name] Procedure Date: 04/05/2024 10:24 AM MRN: [id number] Endoscopist: Sandor Flatter , MD, [id number] Age: 58 Referring MD:  Date of Birth: Jun 12, 1966 Gender: Female Account #: [id number] Procedure:                Colonoscopy Indications:              Surveillance: Personal history of colonic polyps                            (unknown histology) on last colonoscopy 5 years ago                           Last colonoscopy was 07/2019 at outside facility                            and notable for and notable for at least 1 polyp                            and fair prep with recommendation to repeat in 5                            years. Medicines:                Monitored Anesthesia Care Procedure:                Pre-Anesthesia Assessment:                           - Prior to the procedure, a History and Physical                            was performed, and patient medications and                            allergies were reviewed. The patient's tolerance of                            previous anesthesia was also reviewed. The risks                            and benefits of the procedure and the sedation                            options and risks were discussed with the patient.                            All questions were answered, and informed consent                            was obtained. Prior Anticoagulants: The patient has                            taken Lovenox  (enoxaparin ), last dose was 1 day  prior to procedure. ASA Grade Assessment: II - A                            patient with mild systemic disease. After reviewing                            the risks and benefits, the patient was deemed in                            satisfactory condition to undergo the procedure.                           After obtaining informed consent, the colonoscope                            was passed under direct vision.  Throughout the                            procedure, the patient's blood pressure, pulse, and                            oxygen saturations were monitored continuously. The                            Olympus Scope SN: [id number] was introduced through                            the anus and advanced to the the cecum, identified                            by appendiceal orifice and ileocecal valve. The                            colonoscopy was performed without difficulty. The                            patient tolerated the procedure well. The quality                            of the bowel preparation was good. The ileocecal                            valve, appendiceal orifice, and rectum were                            photographed. Scope In: 10:46:11 AM Scope Out: 11:06:19 AM Scope Withdrawal Time: 0 hours 14 minutes 0 seconds  Total Procedure Duration: 0 hours 20 minutes 8 seconds  Findings:                 The perianal and digital rectal examinations were                            normal.  Two sessile polyps were found in the ascending                            colon. The polyps were 4 to 8 mm in size. These                            polyps were removed with a cold snare. Resection                            and retrieval were complete. Estimated blood loss                            was minimal.                           Multiple small-mouthed diverticula were found in                            the sigmoid colon.                           The retroflexed view of the distal rectum and anal                            verge was normal and showed no anal or rectal                            abnormalities. Complications:            No immediate complications. Estimated Blood Loss:     Estimated blood loss was minimal. Impression:               - Two 4 to 8 mm polyps in the ascending colon,                            removed with a cold snare. Resected and  retrieved.                           - Diverticulosis in the sigmoid colon.                           - The distal rectum and anal verge are normal on                            retroflexion view. Recommendation:           - Patient has a contact number available for                            emergencies. The signs and symptoms of potential                            delayed complications were discussed with the  patient. Return to normal activities tomorrow.                            Written discharge instructions were provided to the                            patient.                           - Resume previous diet.                           - Continue present medications.                           - Await pathology results.                           - Repeat colonoscopy for surveillance based on                            pathology results.                           - Return to GI office PRN.                           - Resume Lovenox  (enoxaparin ) at prior dose                            tomorrow. Sandor Flatter, MD 04/05/2024 11:12:00 AM

## 2024-04-05 NOTE — Patient Instructions (Signed)

## 2024-04-05 NOTE — Progress Notes (Signed)
 GASTROENTEROLOGY PROCEDURE H&P NOTE   Primary Care Physician: Gayle Saddie FALCON, PA-C    Reason for Procedure:  Colon polyp surveillance  Plan:    Colonoscopy  Patient is appropriate for endoscopic procedure(s) in the ambulatory ([facility]) setting.  The nature of the procedure, as well as the risks, benefits, and alternatives were carefully and thoroughly reviewed with the patient. Ample time for discussion and questions allowed. The patient understood, was satisfied, and agreed to proceed.     HPI: Christina [name] is a 58 y.o. female who presents for colonoscopy for ongoing colon polyp surveillance and colon cancer screening.  No active GI symptoms.    Last colonoscopy was 07/2019 and notable for and notable for at least 1 polyp and fair prep with recommendation to repeat in 5 years.   Fhx notable for MGM with colon cancer.   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,  multifocal IDC, DCIS, ER/PR+,  01-09-2016 s/p bilteral mastectomies w/ left sln bx;  no chemoradiation   Cataracts, bilateral    Depression    Diabetes mellitus without complication (HCC)    type 2   GAD (generalized anxiety disorder)    Gallbladder problem    History of ovarian cyst    Hypertension    IBS (irritable bowel syndrome)    Joint pain    OSA (obstructive sleep apnea) 09/08/2023   PONV (postoperative nausea and vomiting)    does well with scop patch   Retinal detachment    Rheg OS   Right knee meniscal tear    Urgency of urination     Past Surgical History:  Procedure Laterality Date   ACHILLES TENDON REPAIR Right 2010;  revision 2011   ANTERIOR AND POSTERIOR REPAIR N/A 03/14/2024   Procedure: ANTERIOR (CYSTOCELE) AND POSTERIOR REPAIR (RECTOCELE);  Surgeon: Guadlupe Lianne DASEN, MD;  Location: [facility] OR;  Service: Gynecology;  Laterality: N/A;   BILATERAL TOTAL MASTECTOMY WITH  AXILLARY LYMPH NODE DISSECTION     BLADDER SUSPENSION  2000   BREAST BIOPSY Left 10/2015   BREAST IMPLANT REMOVAL Bilateral 08/12/2017   Procedure: REMOVAL BILATERAL BREAST IMPLANTS;  Surgeon: Lowery Estefana RAMAN, DO;  Location: [facility];  Service: Plastics;  Laterality: Bilateral;   BREAST RECONSTRUCTION WITH PLACEMENT OF TISSUE EXPANDER AND FLEX HD (ACELLULAR HYDRATED DERMIS) Bilateral 01/09/2016   BREAST RECONSTRUCTION WITH PLACEMENT OF TISSUE EXPANDER AND FLEX HD (ACELLULAR HYDRATED DERMIS) Bilateral 01/09/2016   Procedure: BREAST RECONSTRUCTION WITH PLACEMENT OF TISSUE EXPANDER AND FLEX HD (ACELLULAR HYDRATED DERMIS);  Surgeon: Estefana RAMAN Dillingham, DO;  Location: [facility] OR;  Service: Plastics;  Laterality: Bilateral;   BREAST RECONSTRUCTION WITH PLACEMENT OF TISSUE EXPANDER AND FLEX HD (ACELLULAR HYDRATED DERMIS) Bilateral 05/29/2016   Procedure: PLACEMENT OF BILATERAL TISSUE EXPANDER AND FLEX HD (ACELLULAR HYDRATED DERMIS);  Surgeon: Estefana RAMAN Lowery, DO;  Location: [facility];  Service: Plastics;  Laterality: Bilateral;   BREAST REDUCTION SURGERY Bilateral 11/26/2016   Procedure: BILATERAL BREAST CAPSULE CONTRACTURE RELASE;  Surgeon: Estefana RAMAN Lowery, DO;  Location: [facility];  Service: Plastics;  Laterality: Bilateral;   CESAREAN SECTION  1987; 1989; 1991   CYSTOSCOPY N/A 03/14/2024   Procedure: CYSTOSCOPY WITH BLADDER BIOPSY AND FULGURATION;  Surgeon: Guadlupe Lianne DASEN, MD;  Location: [facility] OR;  Service: Gynecology;  Laterality: N/A;  bladder biopsy with fulguration   ELBOW SURGERY Right x3   last one 02-01-2019 @ [facility]   EXAM UNDER ANESTHESIA, PELVIC  03/14/2024   Procedure: EXAM UNDER ANESTHESIA, PELVIC;  Surgeon: Guadlupe Lianne DASEN, MD;  Location: [facility] OR;  Service: Gynecology;;   EYE SURGERY Left 10/01/2020   Pneumatic retinopexy for repair of rheg RD - Dr. Redell Hans   EYE SURGERY Left 10/04/2020   PPV - Dr. Redell Hans   FAT GRAFTING BILATERAL  BREAST  08-09-2018  @[facility]    GAS INSERTION Left 10/04/2020   Procedure: INSERTION OF GAS;  Surgeon: Hans Redell, MD;  Location: [facility] OR;  Service: Ophthalmology;  Laterality: Left;   GAS/FLUID EXCHANGE Left 10/04/2020   Procedure: GAS/FLUID EXCHANGE;  Surgeon: Hans Redell, MD;  Location: [facility] OR;  Service: Ophthalmology;  Laterality: Left;   INCISION AND DRAINAGE OF WOUND Bilateral 02/11/2016   Procedure: IRRIGATION AND DEBRIDEMENT OF BILATERAL BREAST POCKET;  Surgeon: Estefana GORMAN Fritter, DO;  Location: [facility];  Service: Plastics;  Laterality: Bilateral;   INJECTION, BULKING AGENT, URETHRA N/A 03/14/2024   Procedure: INJECTION, BULKING AGENT, URETHRA;  Surgeon: Guadlupe Lianne DASEN, MD;  Location: [facility] OR;  Service: Gynecology;  Laterality: N/A;   KNEE ARTHROSCOPY WITH MEDIAL MENISECTOMY Right 12/20/2019   Procedure: KNEE ARTHROSCOPY WITH MEDIAL MENISECTOMY;  Surgeon: Josefina Chew, MD;  Location: [facility];  Service: Orthopedics;  Laterality: Right;   KNEE ARTHROSCOPY WITH MEDIAL MENISECTOMY Right 10/30/2021   Procedure: RIGHT KNEE ARTHROSCOPY WITH PARTIAL MEDIAL MENISECTOMY SYNOVECTOMY;  Surgeon: Jerri Kay HERO, MD;  Location: [facility];  Service: Orthopedics;  Laterality: Right;   LAPAROSCOPIC APPENDECTOMY  04-07-2011   @[facility]    w/ Excision peritoneal lipoma and lysis adhesions   LAPAROSCOPIC CHOLECYSTECTOMY  ~ 1999   LASER PHOTO ABLATION Right 10/04/2020   Procedure: LASER RETINOPEXY WITH INDIRECT LASER OPTHALMOSCOPE, RIGHT EYE;  Surgeon: Hans Redell, MD;  Location: [facility] OR;  Service: Ophthalmology;  Laterality: Right;   LIPOSUCTION WITH LIPOFILLING Bilateral 11/26/2016   Procedure: LIPOFILLING FOR SYMMETRY;  Surgeon: Estefana GORMAN Fritter, DO;  Location: [facility];  Service: Plastics;  Laterality: Bilateral;   LIPOSUCTION WITH LIPOFILLING Bilateral 01/21/2017   Procedure: BILATERAL BREAST  LIPOFILLING FOR ASYMMETRY;  Surgeon: Fritter Estefana GORMAN, DO;  Location: [facility];  Service: Plastics;  Laterality: Bilateral;   LIPOSUCTION WITH LIPOFILLING Bilateral 06/28/2020   Procedure: Lipofilling bilateral breasts for asymmetry;  Surgeon: Fritter Estefana GORMAN, DO;  Location: [facility];  Service: Plastics;  Laterality: Bilateral;  90 min   MASTECTOMY Bilateral 01/09/2016   NIPPLE SPARING MASTECTOMY/SENTINAL LYMPH NODE BIOPSY/RECONSTRUCTION/PLACEMENT OF TISSUE EXPANDER Bilateral 01/09/2016   Procedure: BILATERAL NIPPLE SPARING MASTECTOMY WITH LEFT SENTINAL LYMPH NODE BIOPSY ;  Surgeon: Jina Nephew, MD;  Location: [facility] OR;  Service: General;  Laterality: Bilateral;   PERINEOPLASTY N/A 03/14/2024   Procedure: PERINEOPLASTY;  Surgeon: Guadlupe Lianne DASEN, MD;  Location: [facility] OR;  Service: Gynecology;  Laterality: N/A;  perineorrhaphy   PHOTOCOAGULATION WITH LASER Left 10/04/2020   Procedure: PHOTOCOAGULATION WITH LASER;  Surgeon: Hans Redell, MD;  Location: [facility] OR;  Service: Ophthalmology;  Laterality: Left;   REMOVAL OF BILATERAL TISSUE EXPANDERS WITH PLACEMENT OF BILATERAL BREAST IMPLANTS Bilateral 08/20/2016   Procedure: REMOVAL OF BILATERAL TISSUE EXPANDERS WITH PLACEMENT OF BILATERAL SILICONE IMPLANTS;  Surgeon: Estefana GORMAN Fritter, DO;  Location: [facility];  Service: Plastics;  Laterality: Bilateral;   REMOVAL OF BILATERAL TISSUE EXPANDERS WITH PLACEMENT OF  BILATERAL BREAST IMPLANTS Bilateral 11/05/2017   Procedure: REMOVAL OF BILATERAL TISSUE EXPANDERS WITH PLACEMENT OF BILATERAL BREAST SILICONE IMPLANTS;  Surgeon: Lowery Estefana RAMAN, DO;  Location: [facility];  Service: Plastics;  Laterality: Bilateral;   REMOVAL OF TISSUE EXPANDER Bilateral 02/11/2016   Procedure: REMOVAL OF BILATERAL TISSUE EXPANDERS AND FLEX HD REMOVAL;  Surgeon: Estefana RAMAN Lowery, DO;  Location: [facility];  Service: Plastics;  Laterality: Bilateral;   RETINAL DETACHMENT SURGERY Left 10/01/2020    Pneumatic retinopexy for repair of rheg RD - Dr. Redell Hans   RETINAL DETACHMENT SURGERY Left 10/04/2020   PPV - Dr. Redell Hans   SCLERAL BUCKLE Left 10/04/2020   Procedure: SCLERAL BUCKLE LEFT EYE;  Surgeon: Hans Redell, MD;  Location: [facility] OR;  Service: Ophthalmology;  Laterality: Left;   TISSUE EXPANDER PLACEMENT Bilateral 08/12/2017   Procedure: PLACEMENT OF BILATERAL TISSUE EXPANDER;  Surgeon: Lowery Estefana RAMAN, DO;  Location: [facility];  Service: Plastics;  Laterality: Bilateral;   TOTAL ABDOMINAL HYSTERECTOMY  05/1996   endometriosis   TOTAL KNEE ARTHROPLASTY Right 03/31/2022   Procedure: RIGHT TOTAL KNEE REPLACEMENT;  Surgeon: Jerri Kay HERO, MD;  Location: [facility] OR;  Service: Orthopedics;  Laterality: Right;   TUBAL LIGATION Bilateral 1991   VITRECTOMY 25 GAUGE WITH SCLERAL BUCKLE Left 10/04/2020   Procedure: 25 GAUGE PARS PLANA VITRECTOMY LEFT EYE ;  Surgeon: Hans Redell, MD;  Location: [facility] OR;  Service: Ophthalmology;  Laterality: Left;    Prior to Admission medications   Medication Sig Start Date End Date Taking? Authorizing Provider  cromolyn (OPTICROM) 4 % ophthalmic solution SMARTSIG:1 Drop(s) In Eye(s) Twice Daily PRN 11/30/23  Yes [provider]  enoxaparin  (LOVENOX ) 40 MG/0.4ML injection Inject 0.4 mLs (40 mg total) into the skin daily. 02/25/24  Yes Zuleta, Kaitlin G, NP  hydrochlorothiazide  (HYDRODIURIL ) 12.5 MG tablet Take 1 tablet (12.5 mg total) by mouth daily. 09/08/23  Yes Wallace Joesph LABOR, PA  metFORMIN  (GLUCOPHAGE ) 500 MG tablet Take 1 tablet (500 mg total) by mouth daily with breakfast. 02/08/24  Yes Clapp, Kara F, PA-C  hydrOXYzine  (ATARAX ) 50 MG tablet TAKE 1/2 TO 1 (ONE-HALF TO ONE) TABLET BY MOUTH EVERY 8 HOURS AS NEEDED FOR  BREAKTHROUGH  ANXIETY 03/02/24   Gayle Numbers F, PA-C  ibuprofen  (ADVIL ) 600 MG tablet Take 1 tablet (600 mg total) by mouth every 6 (six) hours as needed. 02/25/24   Zuleta, Kaitlin G, NP  oxyCODONE  (OXY  IR/ROXICODONE ) 5 MG immediate release tablet Take 1 tablet (5 mg total) by mouth every 4 (four) hours as needed for severe pain (pain score 7-10). Patient not taking: Reported on 04/05/2024 02/25/24   Zuleta, Kaitlin G, NP    Current Outpatient Medications  Medication Sig Dispense Refill   cromolyn (OPTICROM) 4 % ophthalmic solution SMARTSIG:1 Drop(s) In Eye(s) Twice Daily PRN     enoxaparin  (LOVENOX ) 40 MG/0.4ML injection Inject 0.4 mLs (40 mg total) into the skin daily. 11.2 mL 0   hydrochlorothiazide  (HYDRODIURIL ) 12.5 MG tablet Take 1 tablet (12.5 mg total) by mouth daily. 90 tablet 1   metFORMIN  (GLUCOPHAGE ) 500 MG tablet Take 1 tablet (500 mg total) by mouth daily with breakfast. 90 tablet 3   hydrOXYzine  (ATARAX ) 50 MG tablet TAKE 1/2 TO 1 (ONE-HALF TO ONE) TABLET BY MOUTH EVERY 8 HOURS AS NEEDED FOR  BREAKTHROUGH  ANXIETY 30 tablet 0   ibuprofen  (ADVIL ) 600 MG tablet Take 1 tablet (600 mg total) by mouth every 6 (six)  hours as needed. 30 tablet 0   oxyCODONE  (OXY IR/ROXICODONE ) 5 MG immediate release tablet Take 1 tablet (5 mg total) by mouth every 4 (four) hours as needed for severe pain (pain score 7-10). (Patient not taking: Reported on 04/05/2024) 15 tablet 0   Current Facility-Administered Medications  Medication Dose Route Frequency Provider Last Rate Last Admin   0.9 %  sodium chloride  infusion  500 mL Intravenous Once Sharai Overbay V, DO        Allergies as of 04/05/2024   (No Known Allergies)    Family History  Problem Relation Age of Onset   Colon polyps Mother        approx 2   Other Mother        hx HPV and hysterectomy due to precancerous cells   Depression Mother    Anxiety disorder Mother    Heart failure Father    Prostate cancer Father 32   Retinal detachment Father    Allergic rhinitis Sister    Other Sister 12       hx of hysterectomy for unspecified reason; still has ovaries   Other Sister 99       paternal half-sister hx of hysterectomy for  unspecified reason; still has ovaries   Kidney failure Maternal Grandmother    Congestive Heart Failure Maternal Grandmother    Colon cancer Maternal Grandmother 48   Diabetes Maternal Grandmother    Lung cancer Maternal Grandfather 20       smoker   Breast cancer Paternal Grandmother        dx. early 52s; w/ hx of trauma to breast   Crohn's disease Daughter    Bladder Cancer Maternal Uncle 49       not a smoker   Lung cancer Maternal Uncle 37       smoker   Liver disease Neg Hx    Esophageal cancer Neg Hx    Uterine cancer Neg Hx    Renal cancer Neg Hx     Social History   Socioeconomic History   Marital status: Divorced    Spouse name: Investment banker, operational   Number of children: 3   Years of education: Not on file   Highest education level: Not on file  Occupational History   Occupation: Cabin crew: Astronomer   Occupation: Sport and exercise psychologist  Tobacco Use   Smoking status: Former    Current packs/day: 0.00    Average packs/day: 1 pack/day for 10.0 years (10.0 ttl pk-yrs)    Types: Cigarettes    Start date: 06/08/1998    Quit date: 06/08/2008    Years since quitting: 15.8    Passive exposure: Never   Smokeless tobacco: Never  Vaping Use   Vaping status: Never Used  Substance and Sexual Activity   Alcohol use: No   Drug use: Never   Sexual activity: Yes    Birth control/protection: Surgical    Comment: hysterectomy  Other Topics Concern   Not on file  Social History Narrative   Not on file   Social Drivers of Health   Financial Resource Strain: Not on file  Food Insecurity: Not on file  Transportation Needs: Not on file  Physical Activity: Not on file  Stress: Not on file  Social Connections: Unknown (01/17/2022)   Received [facility]   Social Network    Social Network: Not on file  Intimate Partner Violence: Not At Risk (08/01/2023)   [facility]  HITS    Over the last 12 months how often did your partner  physically hurt you?: Never    Over the last 12 months how often did your partner insult you or talk down to you?: Never    Over the last 12 months how often did your partner threaten you with physical harm?: Never    Over the last 12 months how often did your partner scream or curse at you?: Never    Physical Exam: Vital signs in last 24 hours: @BP  138/76   Pulse 61   Temp 97.8 F (36.6 C) (Temporal)   Resp 20   Ht 5' 7 (1.702 m)   Wt 219 lb (99.3 kg)   LMP  (LMP Unknown)   SpO2 97%   BMI 34.30 kg/m  GEN: NAD EYE: Sclerae anicteric ENT: MMM CV: Non-tachycardic Pulm: CTA b/l GI: Soft, NT/ND NEURO:  Alert & Oriented x 3   Sandor Flatter, DO [facility]   04/05/2024 10:37 AM

## 2024-04-05 NOTE — Progress Notes (Signed)
 Called to room to assist during endoscopic procedure.  Patient ID and intended procedure confirmed with present staff. Received instructions for my participation in the procedure from the performing physician.

## 2024-04-05 NOTE — Progress Notes (Signed)
 Report to PACU, RN, vss, BBS= Clear.

## 2024-04-06 ENCOUNTER — Telehealth: Payer: Self-pay

## 2024-04-06 NOTE — Telephone Encounter (Signed)
 Left message

## 2024-04-07 ENCOUNTER — Ambulatory Visit (INDEPENDENT_AMBULATORY_CARE_PROVIDER_SITE_OTHER): Admitting: Physician Assistant

## 2024-04-07 VITALS — BP 135/84 | HR 64 | Ht 67.0 in | Wt 219.2 lb

## 2024-04-07 DIAGNOSIS — R222 Localized swelling, mass and lump, trunk: Secondary | ICD-10-CM

## 2024-04-07 LAB — SURGICAL PATHOLOGY

## 2024-04-07 NOTE — H&P (View-Only) (Signed)
 Patient ID: Christina [name], female    DOB: 08/23/1966, 58 y.o.   MRN: [id number]  Chief Complaint  Patient presents with   Pre-op Exam      ICD-10-CM   1. Mass of skin of back  R22.2        History of Present Illness: Christina [name] is a 58 y.o.  female  with a history of left shoulder neoplasm.  She presents for preoperative evaluation for upcoming procedure, excision of left shoulder neoplasm, scheduled for 04/20/2024 with Dr. Lowery.  The patient has not had problems with anesthesia aside from mild PONV controlled with scopolamine  patches during surgery.  She does have OSA and will mention this to the anesthesia team, as well.  She denies any family history of DVT/PE, but did have a pulmonary embolism following right total knee replacement surgery a couple years ago.  For that reason, she was recently placed on Lovenox  following a bladder surgery, but will conclude well before the excision of left shoulder lipoma.  She denies any history of significant cardiac or pulmonary disease, CVA/MI, ongoing nicotine use (quit 16 years ago), or varicosities.  Discussed risks of surgery and she is agreeable to proceed.  She will have compressive bras that we will maintain compression over the excision site after surgery.  Summary of Previous Visit: She was seen via video visit most recently on 03/18/2024.  At that time, reviewed MRI demonstrating a lipoma in the left posterior shoulder that patient believes is getting bigger.  Discussed surgical excision.  Job: Not currently employed, watching for grandchildren under the age of 42.  Understands no lifting, pushing, or pulling postoperatively.  She states it will not be a problem.  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  She was recently placed on Lovenox  following bladder surgery 03/14/2024, but tells me that it will conclude in approximately 5 days, well  over a week before scheduled surgery.   Past Medical History: Allergies: No Known Allergies  Current Medications:  Current Outpatient Medications:    cromolyn (OPTICROM) 4 % ophthalmic solution, SMARTSIG:1 Drop(s) In Eye(s) Twice Daily PRN, Disp: , Rfl:    enoxaparin  (LOVENOX ) 40 MG/0.4ML injection, Inject 0.4 mLs (40 mg total) into the skin daily., Disp: 11.2 mL, Rfl: 0   hydrochlorothiazide  (HYDRODIURIL ) 12.5 MG tablet, Take 1 tablet (12.5 mg total) by mouth daily., Disp: 90 tablet, Rfl: 1   hydrOXYzine  (ATARAX ) 50 MG tablet, TAKE 1/2 TO 1 (ONE-HALF TO ONE) TABLET BY MOUTH EVERY 8 HOURS AS NEEDED FOR  BREAKTHROUGH  ANXIETY, Disp: 30 tablet, Rfl: 0   ibuprofen  (ADVIL ) 600 MG tablet, Take 1 tablet (600 mg total) by mouth every 6 (six) hours as needed., Disp: 30 tablet, Rfl: 0   metFORMIN  (GLUCOPHAGE ) 500 MG tablet, Take 1 tablet (500 mg total) by mouth daily with breakfast., Disp: 90 tablet, Rfl: 3   oxyCODONE  (OXY IR/ROXICODONE ) 5 MG immediate release tablet, Take 1 tablet (5 mg total) by mouth every 4 (four) hours as needed for severe pain (pain score 7-10). 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,  multifocal IDC, DCIS, ER/PR+,  01-09-2016 s/p bilteral mastectomies  w/ left sln bx;  no chemoradiation   Cataracts, bilateral    Depression    Diabetes mellitus without complication (HCC)    type 2   GAD (generalized anxiety disorder)    Gallbladder problem    History of ovarian cyst    Hypertension    IBS (irritable bowel syndrome)    Joint pain    OSA (obstructive sleep apnea) 09/08/2023   PONV (postoperative nausea and vomiting)    does well with scop patch   Retinal detachment    Rheg OS   Right knee meniscal tear    Urgency of urination     Past  Surgical History: Past Surgical History:  Procedure Laterality Date   ACHILLES TENDON REPAIR Right 2010;  revision 2011   ANTERIOR AND POSTERIOR REPAIR N/A 03/14/2024   Procedure: ANTERIOR (CYSTOCELE) AND POSTERIOR REPAIR (RECTOCELE);  Surgeon: Guadlupe Lianne DASEN, MD;  Location: [facility] OR;  Service: Gynecology;  Laterality: N/A;   BILATERAL TOTAL MASTECTOMY WITH AXILLARY LYMPH NODE DISSECTION     BLADDER SUSPENSION  2000   BREAST BIOPSY Left 10/2015   BREAST IMPLANT REMOVAL Bilateral 08/12/2017   Procedure: REMOVAL BILATERAL BREAST IMPLANTS;  Surgeon: Lowery Estefana RAMAN, DO;  Location: [facility];  Service: Plastics;  Laterality: Bilateral;   BREAST RECONSTRUCTION WITH PLACEMENT OF TISSUE EXPANDER AND FLEX HD (ACELLULAR HYDRATED DERMIS) Bilateral 01/09/2016   BREAST RECONSTRUCTION WITH PLACEMENT OF TISSUE EXPANDER AND FLEX HD (ACELLULAR HYDRATED DERMIS) Bilateral 01/09/2016   Procedure: BREAST RECONSTRUCTION WITH PLACEMENT OF TISSUE EXPANDER AND FLEX HD (ACELLULAR HYDRATED DERMIS);  Surgeon: Estefana RAMAN Dillingham, DO;  Location: [facility] OR;  Service: Plastics;  Laterality: Bilateral;   BREAST RECONSTRUCTION WITH PLACEMENT OF TISSUE EXPANDER AND FLEX HD (ACELLULAR HYDRATED DERMIS) Bilateral 05/29/2016   Procedure: PLACEMENT OF BILATERAL TISSUE EXPANDER AND FLEX HD (ACELLULAR HYDRATED DERMIS);  Surgeon: Estefana RAMAN Lowery, DO;  Location: [facility];  Service: Plastics;  Laterality: Bilateral;   BREAST REDUCTION SURGERY Bilateral 11/26/2016   Procedure: BILATERAL BREAST CAPSULE CONTRACTURE RELASE;  Surgeon: Estefana RAMAN Lowery, DO;  Location: [facility];  Service: Plastics;  Laterality: Bilateral;   CESAREAN SECTION  1987; 1989; 1991   CYSTOSCOPY N/A 03/14/2024   Procedure: CYSTOSCOPY WITH BLADDER BIOPSY AND FULGURATION;  Surgeon: Guadlupe Lianne DASEN, MD;  Location: [facility] OR;  Service: Gynecology;  Laterality: N/A;  bladder biopsy with fulguration   ELBOW SURGERY Right x3   last one  02-01-2019 @ [facility]   EXAM UNDER ANESTHESIA, PELVIC  03/14/2024   Procedure: EXAM UNDER ANESTHESIA, PELVIC;  Surgeon: Guadlupe Lianne DASEN, MD;  Location: [facility] OR;  Service: Gynecology;;   EYE SURGERY Left 10/01/2020   Pneumatic retinopexy for repair of rheg RD - Dr. Redell Hans   EYE SURGERY Left 10/04/2020   PPV - Dr. Redell Hans   FAT GRAFTING BILATERAL BREAST  08-09-2018  @[facility]    GAS INSERTION Left 10/04/2020   Procedure: INSERTION OF GAS;  Surgeon: Hans Redell, MD;  Location: [facility] OR;  Service: Ophthalmology;  Laterality: Left;   GAS/FLUID EXCHANGE Left 10/04/2020   Procedure: GAS/FLUID EXCHANGE;  Surgeon: Hans Redell, MD;  Location: [facility] OR;  Service: Ophthalmology;  Laterality: Left;   INCISION AND DRAINAGE OF WOUND Bilateral 02/11/2016   Procedure: IRRIGATION AND DEBRIDEMENT OF BILATERAL BREAST POCKET;  Surgeon: Estefana RAMAN Lowery, DO;  Location: [facility];  Service: Plastics;  Laterality: Bilateral;   INJECTION, BULKING AGENT, URETHRA N/A 03/14/2024   Procedure: INJECTION, BULKING AGENT, URETHRA;  Surgeon: Guadlupe Lianne DASEN, MD;  Location: [facility] OR;  Service: Gynecology;  Laterality: N/A;   KNEE ARTHROSCOPY WITH MEDIAL MENISECTOMY Right 12/20/2019   Procedure: KNEE ARTHROSCOPY WITH MEDIAL MENISECTOMY;  Surgeon: Josefina Chew, MD;  Location: [facility];  Service: Orthopedics;  Laterality: Right;   KNEE ARTHROSCOPY WITH MEDIAL MENISECTOMY Right 10/30/2021   Procedure: RIGHT KNEE ARTHROSCOPY WITH PARTIAL MEDIAL MENISECTOMY SYNOVECTOMY;  Surgeon: Jerri Kay HERO, MD;  Location: [facility];  Service: Orthopedics;  Laterality: Right;   LAPAROSCOPIC APPENDECTOMY  04-07-2011   @[facility]    w/ Excision peritoneal lipoma and lysis adhesions   LAPAROSCOPIC CHOLECYSTECTOMY  ~ 1999   LASER PHOTO ABLATION Right 10/04/2020   Procedure: LASER RETINOPEXY WITH INDIRECT LASER OPTHALMOSCOPE, RIGHT EYE;  Surgeon: Valdemar Rogue, MD;  Location: [facility] OR;  Service: Ophthalmology;  Laterality:  Right;   LIPOSUCTION WITH LIPOFILLING Bilateral 11/26/2016   Procedure: LIPOFILLING FOR SYMMETRY;  Surgeon: Estefana GORMAN Fritter, DO;  Location: [facility];  Service: Plastics;  Laterality: Bilateral;   LIPOSUCTION WITH LIPOFILLING Bilateral 01/21/2017   Procedure: BILATERAL BREAST  LIPOFILLING FOR ASYMMETRY;  Surgeon: Fritter Estefana GORMAN, DO;  Location: [facility];  Service: Plastics;  Laterality: Bilateral;   LIPOSUCTION WITH LIPOFILLING Bilateral 06/28/2020   Procedure: Lipofilling bilateral breasts for asymmetry;  Surgeon: Fritter Estefana GORMAN, DO;  Location: [facility];  Service: Plastics;  Laterality: Bilateral;  90 min   MASTECTOMY Bilateral 01/09/2016   NIPPLE SPARING MASTECTOMY/SENTINAL LYMPH NODE BIOPSY/RECONSTRUCTION/PLACEMENT OF TISSUE EXPANDER Bilateral 01/09/2016   Procedure: BILATERAL NIPPLE SPARING MASTECTOMY WITH LEFT SENTINAL LYMPH NODE BIOPSY ;  Surgeon: Jina Nephew, MD;  Location: [facility] OR;  Service: General;  Laterality: Bilateral;   PERINEOPLASTY N/A 03/14/2024   Procedure: PERINEOPLASTY;  Surgeon: Guadlupe Lianne DASEN, MD;  Location: [facility] OR;  Service: Gynecology;  Laterality: N/A;  perineorrhaphy   PHOTOCOAGULATION WITH LASER Left 10/04/2020   Procedure: PHOTOCOAGULATION WITH LASER;  Surgeon: Valdemar Rogue, MD;  Location: [facility] OR;  Service: Ophthalmology;  Laterality: Left;   REMOVAL OF BILATERAL TISSUE EXPANDERS WITH PLACEMENT OF BILATERAL BREAST IMPLANTS Bilateral 08/20/2016   Procedure: REMOVAL OF BILATERAL TISSUE EXPANDERS WITH PLACEMENT OF BILATERAL SILICONE IMPLANTS;  Surgeon: Estefana GORMAN Fritter, DO;  Location: [facility];  Service: Plastics;  Laterality: Bilateral;   REMOVAL OF BILATERAL TISSUE EXPANDERS WITH PLACEMENT OF BILATERAL BREAST IMPLANTS Bilateral 11/05/2017   Procedure: REMOVAL OF BILATERAL TISSUE EXPANDERS WITH PLACEMENT OF BILATERAL BREAST SILICONE IMPLANTS;  Surgeon: Fritter Estefana GORMAN, DO;  Location: [facility]  ;  Service: Plastics;  Laterality: Bilateral;   REMOVAL OF TISSUE EXPANDER Bilateral 02/11/2016   Procedure: REMOVAL OF BILATERAL TISSUE EXPANDERS AND FLEX HD REMOVAL;  Surgeon: Estefana GORMAN Fritter, DO;  Location: [facility];  Service: Plastics;  Laterality: Bilateral;   RETINAL DETACHMENT SURGERY Left 10/01/2020   Pneumatic retinopexy for repair of rheg RD - Dr. Rogue Valdemar   RETINAL DETACHMENT SURGERY Left 10/04/2020   PPV - Dr. Rogue Valdemar   SCLERAL BUCKLE Left 10/04/2020   Procedure: SCLERAL BUCKLE LEFT EYE;  Surgeon: Valdemar Rogue, MD;  Location: [facility] OR;  Service: Ophthalmology;  Laterality: Left;   TISSUE EXPANDER PLACEMENT Bilateral 08/12/2017   Procedure: PLACEMENT OF BILATERAL TISSUE EXPANDER;  Surgeon: Fritter Estefana GORMAN, DO;  Location: [facility];  Service: Plastics;  Laterality: Bilateral;   TOTAL ABDOMINAL HYSTERECTOMY  05/1996   endometriosis   TOTAL KNEE ARTHROPLASTY Right 03/31/2022   Procedure: RIGHT TOTAL  KNEE REPLACEMENT;  Surgeon: Jerri Kay HERO, MD;  Location: [facility] OR;  Service: Orthopedics;  Laterality: Right;   TUBAL LIGATION Bilateral 1991   VITRECTOMY 25 GAUGE WITH SCLERAL BUCKLE Left 10/04/2020   Procedure: 25 GAUGE PARS PLANA VITRECTOMY LEFT EYE ;  Surgeon: Valdemar Rogue, MD;  Location: [facility] OR;  Service: Ophthalmology;  Laterality: Left;    Social History: Social History   Socioeconomic History   Marital status: Divorced    Spouse name: Investment banker, operational   Number of children: 3   Years of education: Not on file   Highest education level: Not on file  Occupational History   Occupation: Cabin crew: Astronomer   Occupation: Sport and exercise psychologist  Tobacco Use   Smoking status: Former    Current packs/day: 0.00    Average packs/day: 1 pack/day for 10.0 years (10.0 ttl pk-yrs)    Types: Cigarettes    Start date: 06/08/1998    Quit date: 06/08/2008    Years since quitting: 15.8    Passive exposure:  Never   Smokeless tobacco: Never  Vaping Use   Vaping status: Never Used  Substance and Sexual Activity   Alcohol use: No   Drug use: Never   Sexual activity: Yes    Birth control/protection: Surgical    Comment: hysterectomy  Other Topics Concern   Not on file  Social History Narrative   Not on file   Social Drivers of Health   Financial Resource Strain: Not on file  Food Insecurity: Not on file  Transportation Needs: Not on file  Physical Activity: Not on file  Stress: Not on file  Social Connections: Unknown (01/17/2022)   Received from [facility]   Social Network    Social Network: Not on file  Intimate Partner Violence: Not At Risk (08/01/2023)   Received from [facility]   HITS    Over the last 12 months how often did your partner physically hurt you?: Never    Over the last 12 months how often did your partner insult you or talk down to you?: Never    Over the last 12 months how often did your partner threaten you with physical harm?: Never    Over the last 12 months how often did your partner scream or curse at you?: Never    Family History: Family History  Problem Relation Age of Onset   Colon polyps Mother        approx 2   Other Mother        hx HPV and hysterectomy due to precancerous cells   Depression Mother    Anxiety disorder Mother    Heart failure Father    Prostate cancer Father 24   Retinal detachment Father    Allergic rhinitis Sister    Other Sister 6       hx of hysterectomy for unspecified reason; still has ovaries   Other Sister 51       paternal half-sister hx of hysterectomy for unspecified reason; still has ovaries   Kidney failure Maternal Grandmother    Congestive Heart Failure Maternal Grandmother    Colon cancer Maternal Grandmother 7   Diabetes Maternal Grandmother    Lung cancer Maternal Grandfather 55       smoker   Breast cancer Paternal Grandmother        dx. early 65s; w/ hx of trauma to breast   Crohn's disease  Daughter    Bladder Cancer Maternal Uncle 2  not a smoker   Lung cancer Maternal Uncle 37       smoker   Liver disease Neg Hx    Esophageal cancer Neg Hx    Uterine cancer Neg Hx    Renal cancer Neg Hx     Review of Systems: ROS Recent bladder surgery, denies other issues or complications.  Feels well  Physical Exam: Vital Signs BP 135/84 (BP Location: Left Arm, Patient Position: Sitting, Cuff Size: Large)   Pulse 64   Ht 5' 7 (1.702 m)   Wt 219 lb 3.2 oz (99.4 kg)   LMP  (LMP Unknown)   SpO2 99%   BMI 34.33 kg/m   Physical Exam Constitutional:      General: Not in acute distress.    Appearance: Normal appearance. Not ill-appearing.  HENT:     Head: Normocephalic and atraumatic.  Eyes:     Pupils: Pupils are equal, round. Cardiovascular:     Rate and Rhythm: Normal rate.    Pulses: Normal pulses.  Pulmonary:     Effort: No respiratory distress or increased work of breathing.  Speaks in full sentences. Abdominal:     General: Abdomen is flat. No distension.   Musculoskeletal: Normal range of motion. No lower extremity swelling or edema. No varicosities. Skin:    General: Skin is warm and dry.     Findings: No erythema or rash.  Neurological:     Mental Status: Alert and oriented to person, place, and time.  Psychiatric:        Mood and Affect: Mood normal.        Behavior: Behavior normal.    Assessment/Plan: The patient is scheduled for excision of left shoulder mass with Dr. Lowery.  Risks, benefits, and alternatives of procedure discussed, questions answered and consent obtained.    Smoking Status: Former smoker, quit 16 years ago.  Caprini Score: 9; Risk Factors include: Age, BMI greater than 25, history of DVT/PE, history of breast cancer, and length of planned surgery. Recommendation for mechanical and possible pharmacological prophylaxis.  Will discuss with Dr. Lowery and prescribe Lovenox  if she deems indicated.  Otherwise, we will  encourage early ambulation and compression stockings.  Pictures obtained: 01/15/2024  Post-op Rx sent to pharmacy: Keflex  and Zofran .  Patient reports having oxycodone  already available at home.  Patient was provided with the General Surgical Risk consent document and Pain Medication Agreement prior to their appointment.  They had adequate time to read through the risk consent documents and Pain Medication Agreement. We also discussed them in person together during this preop appointment. All of their questions were answered to their satisfaction.  Recommended calling if they have any further questions.  Risk consent form and Pain Medication Agreement to be scanned into patient's chart.    Electronically signed by: Honora Seip, PA-C 04/07/2024 9:43 AM

## 2024-04-07 NOTE — Progress Notes (Signed)
 Patient ID: Christina [name], female    DOB: 06-20-1966, 58 y.o.   MRN: [id number]  Chief Complaint  Patient presents with   Pre-op Exam      ICD-10-CM   1. Mass of skin of back  R22.2        History of Present Illness: Christina [name] is a 58 y.o.  female  with a history of left shoulder neoplasm.  She presents for preoperative evaluation for upcoming procedure, excision of left shoulder neoplasm, scheduled for 04/20/2024 with Dr. Lowery.  The patient has not had problems with anesthesia aside from mild PONV controlled with scopolamine  patches during surgery.  She does have OSA and will mention this to the anesthesia team, as well.  She denies any family history of DVT/PE, but did have a pulmonary embolism following right total knee replacement surgery a couple years ago.  For that reason, she was recently placed on Lovenox  following a bladder surgery, but will conclude well before the excision of left shoulder lipoma.  She denies any history of significant cardiac or pulmonary disease, CVA/MI, ongoing nicotine use (quit 16 years ago), or varicosities.  Discussed risks of surgery and she is agreeable to proceed.  She will have compressive bras that we will maintain compression over the excision site after surgery.  Summary of Previous Visit: She was seen via video visit most recently on 03/18/2024.  At that time, reviewed MRI demonstrating a lipoma in the left posterior shoulder that patient believes is getting bigger.  Discussed surgical excision.  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  Understands no lifting, pushing, or pulling postoperatively.  She states it will not be a problem.  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  She was recently placed on Lovenox  following bladder surgery 03/14/2024, but tells me that it will conclude in approximately 5 days, well  over a week before scheduled surgery.   Past Medical History: Allergies: No Known Allergies  Current Medications:  Current Outpatient Medications:    cromolyn (OPTICROM) 4 % ophthalmic solution, SMARTSIG:1 Drop(s) In Eye(s) Twice Daily PRN, Disp: , Rfl:    enoxaparin  (LOVENOX ) 40 MG/0.4ML injection, Inject 0.4 mLs (40 mg total) into the skin daily., Disp: 11.2 mL, Rfl: 0   hydrochlorothiazide  (HYDRODIURIL ) 12.5 MG tablet, Take 1 tablet (12.5 mg total) by mouth daily., Disp: 90 tablet, Rfl: 1   hydrOXYzine  (ATARAX ) 50 MG tablet, TAKE 1/2 TO 1 (ONE-HALF TO ONE) TABLET BY MOUTH EVERY 8 HOURS AS NEEDED FOR  BREAKTHROUGH  ANXIETY, Disp: 30 tablet, Rfl: 0   ibuprofen  (ADVIL ) 600 MG tablet, Take 1 tablet (600 mg total) by mouth every 6 (six) hours as needed., Disp: 30 tablet, Rfl: 0   metFORMIN  (GLUCOPHAGE ) 500 MG tablet, Take 1 tablet (500 mg total) by mouth daily with breakfast., Disp: 90 tablet, Rfl: 3   oxyCODONE  (OXY IR/ROXICODONE ) 5 MG immediate release tablet, Take 1 tablet (5 mg total) by mouth every 4 (four) hours as needed for severe pain (pain score 7-10). 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,  multifocal IDC, DCIS, ER/PR+,  01-09-2016 s/p bilteral mastectomies  w/ left sln bx;  no chemoradiation   Cataracts, bilateral    Depression    Diabetes mellitus without complication (HCC)    type 2   GAD (generalized anxiety disorder)    Gallbladder problem    History of ovarian cyst    Hypertension    IBS (irritable bowel syndrome)    Joint pain    OSA (obstructive sleep apnea) 09/08/2023   PONV (postoperative nausea and vomiting)    does well with scop patch   Retinal detachment    Rheg OS   Right knee meniscal tear    Urgency of urination     Past  Surgical History: Past Surgical History:  Procedure Laterality Date   ACHILLES TENDON REPAIR Right 2010;  revision 2011   ANTERIOR AND POSTERIOR REPAIR N/A 03/14/2024   Procedure: ANTERIOR (CYSTOCELE) AND POSTERIOR REPAIR (RECTOCELE);  Surgeon: Guadlupe Lianne DASEN, MD;  Location: [facility] OR;  Service: Gynecology;  Laterality: N/A;   BILATERAL TOTAL MASTECTOMY WITH AXILLARY LYMPH NODE DISSECTION     BLADDER SUSPENSION  2000   BREAST BIOPSY Left 10/2015   BREAST IMPLANT REMOVAL Bilateral 08/12/2017   Procedure: REMOVAL BILATERAL BREAST IMPLANTS;  Surgeon: Lowery Estefana RAMAN, DO;  Location: [facility];  Service: Plastics;  Laterality: Bilateral;   BREAST RECONSTRUCTION WITH PLACEMENT OF TISSUE EXPANDER AND FLEX HD (ACELLULAR HYDRATED DERMIS) Bilateral 01/09/2016   BREAST RECONSTRUCTION WITH PLACEMENT OF TISSUE EXPANDER AND FLEX HD (ACELLULAR HYDRATED DERMIS) Bilateral 01/09/2016   Procedure: BREAST RECONSTRUCTION WITH PLACEMENT OF TISSUE EXPANDER AND FLEX HD (ACELLULAR HYDRATED DERMIS);  Surgeon: Estefana RAMAN Dillingham, DO;  Location: [facility] OR;  Service: Plastics;  Laterality: Bilateral;   BREAST RECONSTRUCTION WITH PLACEMENT OF TISSUE EXPANDER AND FLEX HD (ACELLULAR HYDRATED DERMIS) Bilateral 05/29/2016   Procedure: PLACEMENT OF BILATERAL TISSUE EXPANDER AND FLEX HD (ACELLULAR HYDRATED DERMIS);  Surgeon: Estefana RAMAN Lowery, DO;  Location: [facility];  Service: Plastics;  Laterality: Bilateral;   BREAST REDUCTION SURGERY Bilateral 11/26/2016   Procedure: BILATERAL BREAST CAPSULE CONTRACTURE RELASE;  Surgeon: Estefana RAMAN Lowery, DO;  Location: [facility];  Service: Plastics;  Laterality: Bilateral;   CESAREAN SECTION  1987; 1989; 1991   CYSTOSCOPY N/A 03/14/2024   Procedure: CYSTOSCOPY WITH BLADDER BIOPSY AND FULGURATION;  Surgeon: Guadlupe Lianne DASEN, MD;  Location: [facility] OR;  Service: Gynecology;  Laterality: N/A;  bladder biopsy with fulguration   ELBOW SURGERY Right x3   last one  02-01-2019 @ [facility]   EXAM UNDER ANESTHESIA, PELVIC  03/14/2024   Procedure: EXAM UNDER ANESTHESIA, PELVIC;  Surgeon: Guadlupe Lianne DASEN, MD;  Location: [facility] OR;  Service: Gynecology;;   EYE SURGERY Left 10/01/2020   Pneumatic retinopexy for repair of rheg RD - Dr. Redell Hans   EYE SURGERY Left 10/04/2020   PPV - Dr. Redell Hans   FAT GRAFTING BILATERAL BREAST  08-09-2018  @[facility]    GAS INSERTION Left 10/04/2020   Procedure: INSERTION OF GAS;  Surgeon: Hans Redell, MD;  Location: [facility] OR;  Service: Ophthalmology;  Laterality: Left;   GAS/FLUID EXCHANGE Left 10/04/2020   Procedure: GAS/FLUID EXCHANGE;  Surgeon: Hans Redell, MD;  Location: [facility] OR;  Service: Ophthalmology;  Laterality: Left;   INCISION AND DRAINAGE OF WOUND Bilateral 02/11/2016   Procedure: IRRIGATION AND DEBRIDEMENT OF BILATERAL BREAST POCKET;  Surgeon: Estefana RAMAN Lowery, DO;  Location:  SURGERY CENTER;  Service: Plastics;  Laterality: Bilateral;   INJECTION, BULKING AGENT, URETHRA N/A 03/14/2024   Procedure: INJECTION, BULKING AGENT, URETHRA;  Surgeon: Guadlupe Lianne DASEN, MD;  Location: [facility] OR;  Service: Gynecology;  Laterality: N/A;   KNEE ARTHROSCOPY WITH MEDIAL MENISECTOMY Right 12/20/2019   Procedure: KNEE ARTHROSCOPY WITH MEDIAL MENISECTOMY;  Surgeon: Josefina Chew, MD;  Location: [facility];  Service: Orthopedics;  Laterality: Right;   KNEE ARTHROSCOPY WITH MEDIAL MENISECTOMY Right 10/30/2021   Procedure: RIGHT KNEE ARTHROSCOPY WITH PARTIAL MEDIAL MENISECTOMY SYNOVECTOMY;  Surgeon: Jerri Kay HERO, MD;  Location: [facility];  Service: Orthopedics;  Laterality: Right;   LAPAROSCOPIC APPENDECTOMY  04-07-2011   @[facility]    w/ Excision peritoneal lipoma and lysis adhesions   LAPAROSCOPIC CHOLECYSTECTOMY  ~ 1999   LASER PHOTO ABLATION Right 10/04/2020   Procedure: LASER RETINOPEXY WITH INDIRECT LASER OPTHALMOSCOPE, RIGHT EYE;  Surgeon: Valdemar Rogue, MD;  Location: [facility] OR;  Service: Ophthalmology;  Laterality:  Right;   LIPOSUCTION WITH LIPOFILLING Bilateral 11/26/2016   Procedure: LIPOFILLING FOR SYMMETRY;  Surgeon: Estefana GORMAN Fritter, DO;  Location: [facility];  Service: Plastics;  Laterality: Bilateral;   LIPOSUCTION WITH LIPOFILLING Bilateral 01/21/2017   Procedure: BILATERAL BREAST  LIPOFILLING FOR ASYMMETRY;  Surgeon: Fritter Estefana GORMAN, DO;  Location: [facility];  Service: Plastics;  Laterality: Bilateral;   LIPOSUCTION WITH LIPOFILLING Bilateral 06/28/2020   Procedure: Lipofilling bilateral breasts for asymmetry;  Surgeon: Fritter Estefana GORMAN, DO;  Location: [facility];  Service: Plastics;  Laterality: Bilateral;  90 min   MASTECTOMY Bilateral 01/09/2016   NIPPLE SPARING MASTECTOMY/SENTINAL LYMPH NODE BIOPSY/RECONSTRUCTION/PLACEMENT OF TISSUE EXPANDER Bilateral 01/09/2016   Procedure: BILATERAL NIPPLE SPARING MASTECTOMY WITH LEFT SENTINAL LYMPH NODE BIOPSY ;  Surgeon: Jina Nephew, MD;  Location: [facility] OR;  Service: General;  Laterality: Bilateral;   PERINEOPLASTY N/A 03/14/2024   Procedure: PERINEOPLASTY;  Surgeon: Guadlupe Lianne DASEN, MD;  Location: [facility] OR;  Service: Gynecology;  Laterality: N/A;  perineorrhaphy   PHOTOCOAGULATION WITH LASER Left 10/04/2020   Procedure: PHOTOCOAGULATION WITH LASER;  Surgeon: Valdemar Rogue, MD;  Location: [facility] OR;  Service: Ophthalmology;  Laterality: Left;   REMOVAL OF BILATERAL TISSUE EXPANDERS WITH PLACEMENT OF BILATERAL BREAST IMPLANTS Bilateral 08/20/2016   Procedure: REMOVAL OF BILATERAL TISSUE EXPANDERS WITH PLACEMENT OF BILATERAL SILICONE IMPLANTS;  Surgeon: Estefana GORMAN Fritter, DO;  Location: [facility];  Service: Plastics;  Laterality: Bilateral;   REMOVAL OF BILATERAL TISSUE EXPANDERS WITH PLACEMENT OF BILATERAL BREAST IMPLANTS Bilateral 11/05/2017   Procedure: REMOVAL OF BILATERAL TISSUE EXPANDERS WITH PLACEMENT OF BILATERAL BREAST SILICONE IMPLANTS;  Surgeon: Fritter Estefana GORMAN, DO;  Location: [facility];  Service: Plastics;  Laterality: Bilateral;   REMOVAL OF TISSUE EXPANDER Bilateral 02/11/2016   Procedure: REMOVAL OF BILATERAL TISSUE EXPANDERS AND FLEX HD REMOVAL;  Surgeon: Estefana GORMAN Fritter, DO;  Location:  SURGERY CENTER;  Service: Plastics;  Laterality: Bilateral;   RETINAL DETACHMENT SURGERY Left 10/01/2020   Pneumatic retinopexy for repair of rheg RD - Dr. Rogue Valdemar   RETINAL DETACHMENT SURGERY Left 10/04/2020   PPV - Dr. Rogue Valdemar   SCLERAL BUCKLE Left 10/04/2020   Procedure: SCLERAL BUCKLE LEFT EYE;  Surgeon: Valdemar Rogue, MD;  Location: [facility] OR;  Service: Ophthalmology;  Laterality: Left;   TISSUE EXPANDER PLACEMENT Bilateral 08/12/2017   Procedure: PLACEMENT OF BILATERAL TISSUE EXPANDER;  Surgeon: Fritter Estefana GORMAN, DO;  Location: [facility];  Service: Plastics;  Laterality: Bilateral;   TOTAL ABDOMINAL HYSTERECTOMY  05/1996   endometriosis   TOTAL KNEE ARTHROPLASTY Right 03/31/2022   Procedure: RIGHT TOTAL  KNEE REPLACEMENT;  Surgeon: Jerri Kay HERO, MD;  Location: [facility] OR;  Service: Orthopedics;  Laterality: Right;   TUBAL LIGATION Bilateral 1991   VITRECTOMY 25 GAUGE WITH SCLERAL BUCKLE Left 10/04/2020   Procedure: 25 GAUGE PARS PLANA VITRECTOMY LEFT EYE ;  Surgeon: Valdemar Rogue, MD;  Location: [facility] OR;  Service: Ophthalmology;  Laterality: Left;    Social History: Social History   Socioeconomic History   Marital status: Divorced    Spouse name: Investment banker, operational   Number of children: 3   Years of education: Not on file   Highest education level: Not on file  Occupational History   Occupation: Cabin crew: Astronomer   Occupation: Sport and exercise psychologist  Tobacco Use   Smoking status: Former    Current packs/day: 0.00    Average packs/day: 1 pack/day for 10.0 years (10.0 ttl pk-yrs)    Types: Cigarettes    Start date: 06/08/1998    Quit date: 06/08/2008    Years since quitting: 15.8    Passive exposure:  Never   Smokeless tobacco: Never  Vaping Use   Vaping status: Never Used  Substance and Sexual Activity   Alcohol use: No   Drug use: Never   Sexual activity: Yes    Birth control/protection: Surgical    Comment: hysterectomy  Other Topics Concern   Not on file  Social History Narrative   Not on file   Social Drivers of Health   Financial Resource Strain: Not on file  Food Insecurity: Not on file  Transportation Needs: Not on file  Physical Activity: Not on file  Stress: Not on file  Social Connections: Unknown (01/17/2022)   Received from [facility]   Social Network    Social Network: Not on file  Intimate Partner Violence: Not At Risk (08/01/2023)   Received from [facility]   HITS    Over the last 12 months how often did your partner physically hurt you?: Never    Over the last 12 months how often did your partner insult you or talk down to you?: Never    Over the last 12 months how often did your partner threaten you with physical harm?: Never    Over the last 12 months how often did your partner scream or curse at you?: Never    Family History: Family History  Problem Relation Age of Onset   Colon polyps Mother        approx 2   Other Mother        hx HPV and hysterectomy due to precancerous cells   Depression Mother    Anxiety disorder Mother    Heart failure Father    Prostate cancer Father 66   Retinal detachment Father    Allergic rhinitis Sister    Other Sister 52       hx of hysterectomy for unspecified reason; still has ovaries   Other Sister 36       paternal half-sister hx of hysterectomy for unspecified reason; still has ovaries   Kidney failure Maternal Grandmother    Congestive Heart Failure Maternal Grandmother    Colon cancer Maternal Grandmother 24   Diabetes Maternal Grandmother    Lung cancer Maternal Grandfather 9       smoker   Breast cancer Paternal Grandmother        dx. early 18s; w/ hx of trauma to breast   Crohn's disease  Daughter    Bladder Cancer Maternal Uncle 25  not a smoker   Lung cancer Maternal Uncle 37       smoker   Liver disease Neg Hx    Esophageal cancer Neg Hx    Uterine cancer Neg Hx    Renal cancer Neg Hx     Review of Systems: ROS Recent bladder surgery, denies other issues or complications.  Feels well  Physical Exam: Vital Signs BP 135/84 (BP Location: Left Arm, Patient Position: Sitting, Cuff Size: Large)   Pulse 64   Ht 5' 7 (1.702 m)   Wt 219 lb 3.2 oz (99.4 kg)   LMP  (LMP Unknown)   SpO2 99%   BMI 34.33 kg/m   Physical Exam Constitutional:      General: Not in acute distress.    Appearance: Normal appearance. Not ill-appearing.  HENT:     Head: Normocephalic and atraumatic.  Eyes:     Pupils: Pupils are equal, round. Cardiovascular:     Rate and Rhythm: Normal rate.    Pulses: Normal pulses.  Pulmonary:     Effort: No respiratory distress or increased work of breathing.  Speaks in full sentences. Abdominal:     General: Abdomen is flat. No distension.   Musculoskeletal: Normal range of motion. No lower extremity swelling or edema. No varicosities. Skin:    General: Skin is warm and dry.     Findings: No erythema or rash.  Neurological:     Mental Status: Alert and oriented to person, place, and time.  Psychiatric:        Mood and Affect: Mood normal.        Behavior: Behavior normal.    Assessment/Plan: The patient is scheduled for excision of left shoulder mass with Dr. Lowery.  Risks, benefits, and alternatives of procedure discussed, questions answered and consent obtained.    Smoking Status: Former smoker, quit 16 years ago.  Caprini Score: 9; Risk Factors include: Age, BMI greater than 25, history of DVT/PE, history of breast cancer, and length of planned surgery. Recommendation for mechanical and possible pharmacological prophylaxis.  Will discuss with Dr. Lowery and prescribe Lovenox  if she deems indicated.  Otherwise, we will  encourage early ambulation and compression stockings.  Pictures obtained: 01/15/2024  Post-op Rx sent to pharmacy: Keflex  and Zofran .  Patient reports having oxycodone  already available at home.  Patient was provided with the General Surgical Risk consent document and Pain Medication Agreement prior to their appointment.  They had adequate time to read through the risk consent documents and Pain Medication Agreement. We also discussed them in person together during this preop appointment. All of their questions were answered to their satisfaction.  Recommended calling if they have any further questions.  Risk consent form and Pain Medication Agreement to be scanned into patient's chart.    Electronically signed by: Honora Seip, PA-C 04/07/2024 9:43 AM

## 2024-04-08 ENCOUNTER — Encounter: Admitting: Physical Medicine & Rehabilitation

## 2024-04-11 ENCOUNTER — Ambulatory Visit: Payer: Self-pay | Admitting: Gastroenterology

## 2024-04-19 ENCOUNTER — Encounter (HOSPITAL_BASED_OUTPATIENT_CLINIC_OR_DEPARTMENT_OTHER)
Admission: RE | Admit: 2024-04-19 | Discharge: 2024-04-19 | Disposition: A | Discharge status: Home / Self Care | Source: Ambulatory Visit | Attending: Plastic Surgery | Admitting: Plastic Surgery

## 2024-04-19 DIAGNOSIS — F419 Anxiety disorder, unspecified: Secondary | ICD-10-CM | POA: Diagnosis not present

## 2024-04-19 DIAGNOSIS — Z7984 Long term (current) use of oral hypoglycemic drugs: Secondary | ICD-10-CM | POA: Diagnosis not present

## 2024-04-19 DIAGNOSIS — D1722 Benign lipomatous neoplasm of skin and subcutaneous tissue of left arm: Secondary | ICD-10-CM | POA: Diagnosis not present

## 2024-04-19 DIAGNOSIS — Z419 Encounter for procedure for purposes other than remedying health state, unspecified: Secondary | ICD-10-CM | POA: Diagnosis not present

## 2024-04-19 DIAGNOSIS — I10 Essential (primary) hypertension: Secondary | ICD-10-CM | POA: Diagnosis not present

## 2024-04-19 DIAGNOSIS — F32A Depression, unspecified: Secondary | ICD-10-CM | POA: Diagnosis not present

## 2024-04-19 DIAGNOSIS — Z87891 Personal history of nicotine dependence: Secondary | ICD-10-CM | POA: Diagnosis not present

## 2024-04-19 DIAGNOSIS — M1990 Unspecified osteoarthritis, unspecified site: Secondary | ICD-10-CM | POA: Diagnosis not present

## 2024-04-19 DIAGNOSIS — E119 Type 2 diabetes mellitus without complications: Secondary | ICD-10-CM | POA: Diagnosis not present

## 2024-04-19 DIAGNOSIS — R222 Localized swelling, mass and lump, trunk: Secondary | ICD-10-CM | POA: Diagnosis present

## 2024-04-19 DIAGNOSIS — Z833 Family history of diabetes mellitus: Secondary | ICD-10-CM | POA: Diagnosis not present

## 2024-04-19 DIAGNOSIS — G4733 Obstructive sleep apnea (adult) (pediatric): Secondary | ICD-10-CM | POA: Diagnosis not present

## 2024-04-19 DIAGNOSIS — Z79899 Other long term (current) drug therapy: Secondary | ICD-10-CM | POA: Diagnosis not present

## 2024-04-19 LAB — BASIC METABOLIC PANEL WITH GFR
Anion gap: 5 (ref 5–15)
BUN: 14 mg/dL (ref 6–20)
CO2: 22 mmol/L (ref 22–32)
Calcium: 9.7 mg/dL (ref 8.9–10.3)
Chloride: 110 mmol/L (ref 98–111)
Creatinine, Ser: 0.62 mg/dL (ref 0.44–1.00)
GFR, Estimated: >60 mL/min (ref >60)
Glucose, Bld: 164 mg/dL — ABNORMAL HIGH (ref 70–99)
Potassium: 4.4 mmol/L (ref 3.5–5.1)
Sodium: 137 mmol/L (ref 135–145)

## 2024-04-20 ENCOUNTER — Ambulatory Visit (HOSPITAL_BASED_OUTPATIENT_CLINIC_OR_DEPARTMENT_OTHER): Admitting: Anesthesiology

## 2024-04-20 ENCOUNTER — Encounter (HOSPITAL_BASED_OUTPATIENT_CLINIC_OR_DEPARTMENT_OTHER): Payer: Self-pay | Admitting: Plastic Surgery

## 2024-04-20 ENCOUNTER — Ambulatory Visit (HOSPITAL_BASED_OUTPATIENT_CLINIC_OR_DEPARTMENT_OTHER)
Admission: RE | Admit: 2024-04-20 | Discharge: 2024-04-20 | Disposition: A | Discharge status: Home / Self Care | Attending: Plastic Surgery | Admitting: Plastic Surgery

## 2024-04-20 ENCOUNTER — Other Ambulatory Visit: Payer: Self-pay

## 2024-04-20 ENCOUNTER — Encounter (HOSPITAL_BASED_OUTPATIENT_CLINIC_OR_DEPARTMENT_OTHER)
Admission: RE | Disposition: A | Discharge status: Home / Self Care | Payer: Self-pay | Source: Home / Self Care | Attending: Plastic Surgery

## 2024-04-20 DIAGNOSIS — F32A Depression, unspecified: Secondary | ICD-10-CM | POA: Insufficient documentation

## 2024-04-20 DIAGNOSIS — I10 Essential (primary) hypertension: Secondary | ICD-10-CM | POA: Insufficient documentation

## 2024-04-20 DIAGNOSIS — D1722 Benign lipomatous neoplasm of skin and subcutaneous tissue of left arm: Secondary | ICD-10-CM

## 2024-04-20 DIAGNOSIS — M1990 Unspecified osteoarthritis, unspecified site: Secondary | ICD-10-CM | POA: Insufficient documentation

## 2024-04-20 DIAGNOSIS — Z79899 Other long term (current) drug therapy: Secondary | ICD-10-CM | POA: Insufficient documentation

## 2024-04-20 DIAGNOSIS — Z01818 Encounter for other preprocedural examination: Secondary | ICD-10-CM

## 2024-04-20 DIAGNOSIS — Z87891 Personal history of nicotine dependence: Secondary | ICD-10-CM | POA: Insufficient documentation

## 2024-04-20 DIAGNOSIS — Z7984 Long term (current) use of oral hypoglycemic drugs: Secondary | ICD-10-CM | POA: Insufficient documentation

## 2024-04-20 DIAGNOSIS — Z833 Family history of diabetes mellitus: Secondary | ICD-10-CM | POA: Insufficient documentation

## 2024-04-20 DIAGNOSIS — F419 Anxiety disorder, unspecified: Secondary | ICD-10-CM | POA: Insufficient documentation

## 2024-04-20 DIAGNOSIS — E119 Type 2 diabetes mellitus without complications: Secondary | ICD-10-CM | POA: Insufficient documentation

## 2024-04-20 DIAGNOSIS — G4733 Obstructive sleep apnea (adult) (pediatric): Secondary | ICD-10-CM | POA: Insufficient documentation

## 2024-04-20 HISTORY — PX: EXCISION MASS, BACK: SHX7560

## 2024-04-20 LAB — GLUCOSE, CAPILLARY
Glucose-Capillary: 138 mg/dL — ABNORMAL HIGH (ref 70–99)
Glucose-Capillary: 136 mg/dL — ABNORMAL HIGH (ref 70–99)

## 2024-04-20 SURGERY — EXCISION MASS, BACK
Anesthesia: General | Site: Shoulder | Laterality: Left

## 2024-04-20 MED ORDER — ACETAMINOPHEN 325 MG RE SUPP: 650.0000 mg | RECTAL | Status: DC | PRN

## 2024-04-20 MED ORDER — OXYCODONE HCL 5 MG/5ML PO SOLN: 5.0000 mg | Freq: Once | ORAL | Status: AC | PRN

## 2024-04-20 MED ORDER — SUGAMMADEX SODIUM 200 MG/2ML IV SOLN
INTRAVENOUS | Status: DC | PRN
  Administered 2024-04-20 (×2): 250 mg via INTRAVENOUS

## 2024-04-20 MED ORDER — OXYCODONE HCL 5 MG PO TABS
5.0000 mg | ORAL_TABLET | Freq: Once | ORAL | Status: AC | PRN
  Administered 2024-04-20 (×2): 5 mg via ORAL

## 2024-04-20 MED ORDER — LACTATED RINGERS IV SOLN: INTRAVENOUS | Status: DC

## 2024-04-20 MED ORDER — SODIUM CHLORIDE 0.9% FLUSH: 3.0000 mL | Freq: Two times a day (BID) | INTRAVENOUS | Status: DC

## 2024-04-20 MED ORDER — FENTANYL CITRATE (PF) 100 MCG/2ML IJ SOLN: 25.0000 ug | INTRAMUSCULAR | Status: DC | PRN

## 2024-04-20 MED ORDER — ROCURONIUM BROMIDE 10 MG/ML (PF) SYRINGE
PREFILLED_SYRINGE | INTRAVENOUS | Status: AC
  Filled 2024-04-20: qty 10

## 2024-04-20 MED ORDER — SCOPOLAMINE 1 MG/3DAYS TD PT72
1.0000 | MEDICATED_PATCH | Freq: Once | TRANSDERMAL | Status: DC
Start: 2024-04-20 — End: 2024-04-20
  Administered 2024-04-20 (×2): 1.5 mg via TRANSDERMAL

## 2024-04-20 MED ORDER — CHLORHEXIDINE GLUCONATE CLOTH 2 % EX PADS: 6.0000 | MEDICATED_PAD | Freq: Once | CUTANEOUS | Status: DC

## 2024-04-20 MED ORDER — SCOPOLAMINE 1 MG/3DAYS TD PT72
MEDICATED_PATCH | TRANSDERMAL | Status: AC
  Filled 2024-04-20: qty 1

## 2024-04-20 MED ORDER — VASHE WOUND IRRIGATION OPTIME
TOPICAL | Status: DC | PRN
  Administered 2024-04-20 (×2): 34 [oz_av]

## 2024-04-20 MED ORDER — OXYCODONE HCL 5 MG PO TABS
ORAL_TABLET | ORAL | Status: AC
Start: 2024-04-20 — End: 2024-04-20
  Filled 2024-04-20: qty 1

## 2024-04-20 MED ORDER — LIDOCAINE HCL (CARDIAC) PF 100 MG/5ML IV SOSY
PREFILLED_SYRINGE | INTRAVENOUS | Status: DC | PRN
  Administered 2024-04-20 (×2): 50 mg via INTRAVENOUS

## 2024-04-20 MED ORDER — OXYCODONE HCL 5 MG PO TABS: 5.0000 mg | ORAL_TABLET | ORAL | Status: DC | PRN

## 2024-04-20 MED ORDER — CEFAZOLIN SODIUM-DEXTROSE 2-4 GM/100ML-% IV SOLN
2.0000 g | INTRAVENOUS | Status: AC
  Administered 2024-04-20 (×2): 2 g via INTRAVENOUS

## 2024-04-20 MED ORDER — PHENYLEPHRINE 80 MCG/ML (10ML) SYRINGE FOR IV PUSH (FOR BLOOD PRESSURE SUPPORT)
PREFILLED_SYRINGE | INTRAVENOUS | Status: AC
  Filled 2024-04-20: qty 10

## 2024-04-20 MED ORDER — ACETAMINOPHEN 325 MG PO TABS: 650.0000 mg | ORAL_TABLET | ORAL | Status: DC | PRN

## 2024-04-20 MED ORDER — ONDANSETRON HCL 4 MG/2ML IJ SOLN
INTRAMUSCULAR | Status: DC | PRN
Start: 2024-04-20 — End: 2024-04-20
  Administered 2024-04-20 (×2): 4 mg via INTRAVENOUS

## 2024-04-20 MED ORDER — LIDOCAINE-EPINEPHRINE 1 %-1:100000 IJ SOLN
INTRAMUSCULAR | Status: DC | PRN
  Administered 2024-04-20 (×2): 20 mL

## 2024-04-20 MED ORDER — FENTANYL CITRATE (PF) 100 MCG/2ML IJ SOLN
INTRAMUSCULAR | Status: DC | PRN
  Administered 2024-04-20 (×2): 100 ug via INTRAVENOUS

## 2024-04-20 MED ORDER — FENTANYL CITRATE (PF) 100 MCG/2ML IJ SOLN
INTRAMUSCULAR | Status: AC
  Filled 2024-04-20: qty 2

## 2024-04-20 MED ORDER — DEXAMETHASONE SODIUM PHOSPHATE 4 MG/ML IJ SOLN
INTRAMUSCULAR | Status: DC | PRN
  Administered 2024-04-20 (×2): 5 mg via INTRAVENOUS

## 2024-04-20 MED ORDER — ROCURONIUM BROMIDE 100 MG/10ML IV SOLN
INTRAVENOUS | Status: DC | PRN
  Administered 2024-04-20 (×2): 50 mg via INTRAVENOUS

## 2024-04-20 MED ORDER — CEFAZOLIN SODIUM-DEXTROSE 2-4 GM/100ML-% IV SOLN
INTRAVENOUS | Status: AC
  Filled 2024-04-20: qty 100

## 2024-04-20 MED ORDER — SODIUM CHLORIDE 0.9 % IV SOLN
250.0000 mL | INTRAVENOUS | Status: DC | PRN
Start: 2024-04-20 — End: 2024-04-20

## 2024-04-20 MED ORDER — DEXAMETHASONE SODIUM PHOSPHATE 10 MG/ML IJ SOLN
INTRAMUSCULAR | Status: AC
  Filled 2024-04-20: qty 1

## 2024-04-20 MED ORDER — MIDAZOLAM HCL 2 MG/2ML IJ SOLN
INTRAMUSCULAR | Status: AC
  Filled 2024-04-20: qty 2

## 2024-04-20 MED ORDER — ATROPINE SULFATE 0.4 MG/ML IV SOLN
INTRAVENOUS | Status: AC
  Filled 2024-04-20: qty 1

## 2024-04-20 MED ORDER — EPHEDRINE 5 MG/ML INJ
INTRAVENOUS | Status: AC
  Filled 2024-04-20: qty 5

## 2024-04-20 MED ORDER — SUCCINYLCHOLINE CHLORIDE 200 MG/10ML IV SOSY
PREFILLED_SYRINGE | INTRAVENOUS | Status: AC
  Filled 2024-04-20: qty 10

## 2024-04-20 MED ORDER — LIDOCAINE 2% (20 MG/ML) 5 ML SYRINGE
INTRAMUSCULAR | Status: AC
  Filled 2024-04-20: qty 5

## 2024-04-20 MED ORDER — ONDANSETRON HCL 4 MG/2ML IJ SOLN: 4.0000 mg | Freq: Four times a day (QID) | INTRAMUSCULAR | Status: DC | PRN

## 2024-04-20 MED ORDER — MIDAZOLAM HCL 5 MG/5ML IJ SOLN
INTRAMUSCULAR | Status: DC | PRN
  Administered 2024-04-20 (×2): 2 mg via INTRAVENOUS

## 2024-04-20 MED ORDER — PROPOFOL 10 MG/ML IV BOLUS
INTRAVENOUS | Status: DC | PRN
  Administered 2024-04-20 (×2): 150 mg via INTRAVENOUS

## 2024-04-20 MED ORDER — SODIUM CHLORIDE 0.9% FLUSH
3.0000 mL | INTRAVENOUS | Status: DC | PRN
Start: 2024-04-20 — End: 2024-04-20

## 2024-04-20 MED ORDER — ONDANSETRON HCL 4 MG/2ML IJ SOLN
INTRAMUSCULAR | Status: AC
  Filled 2024-04-20: qty 2

## 2024-04-20 SURGICAL SUPPLY — 42 items
BINDER BREAST XXLRG (GAUZE/BANDAGES/DRESSINGS) IMPLANT
BLADE CLIPPER SURG (BLADE) IMPLANT
BLADE HEX COATED 2.75 (ELECTRODE) IMPLANT
BLADE SURG 15 STRL LF DISP TIS (BLADE) ×1 IMPLANT
CANISTER SUCT 1200ML W/VALVE (MISCELLANEOUS) IMPLANT
CLEANSER WND VASHE 34 (WOUND CARE) IMPLANT
COVER BACK TABLE 60X90IN (DRAPES) ×1 IMPLANT
COVER MAYO STAND STRL (DRAPES) ×1 IMPLANT
DERMABOND ADVANCED .7 DNX12 (GAUZE/BANDAGES/DRESSINGS) IMPLANT
DRAPE LAPAROTOMY 100X72 PEDS (DRAPES) IMPLANT
DRAPE U-SHAPE 76X120 STRL (DRAPES) IMPLANT
DRESSING MEPILEX FLEX 4X4 (GAUZE/BANDAGES/DRESSINGS) IMPLANT
ELECT COATED BLADE 2.86 ST (ELECTRODE) IMPLANT
ELECTRODE REM PT RTRN 9FT ADLT (ELECTROSURGICAL) IMPLANT
GAUZE PAD ABD 8X10 STRL (GAUZE/BANDAGES/DRESSINGS) IMPLANT
GAUZE SPONGE 2X2 STRL 8-PLY (GAUZE/BANDAGES/DRESSINGS) IMPLANT
GAUZE SPONGE 4X4 12PLY STRL LF (GAUZE/BANDAGES/DRESSINGS) IMPLANT
GLOVE BIO SURGEON STRL SZ 6.5 (GLOVE) ×1 IMPLANT
GLOVE BIO SURGEON STRL SZ7.5 (GLOVE) IMPLANT
GLOVE BIOGEL PI IND STRL 7.0 (GLOVE) IMPLANT
GLOVE BIOGEL PI IND STRL 8 (GLOVE) IMPLANT
GOWN STRL REUS W/ TWL LRG LVL3 (GOWN DISPOSABLE) ×1 IMPLANT
GOWN STRL REUS W/TWL XL LVL3 (GOWN DISPOSABLE) IMPLANT
NDL HYPO 25X1 1.5 SAFETY (NEEDLE) IMPLANT
NEEDLE HYPO 25X1 1.5 SAFETY (NEEDLE) ×1 IMPLANT
NS IRRIG 1000ML POUR BTL (IV SOLUTION) IMPLANT
PACK BASIN DAY SURGERY FS (CUSTOM PROCEDURE TRAY) ×1 IMPLANT
PENCIL SMOKE EVACUATOR (MISCELLANEOUS) IMPLANT
STRIP CLOSURE SKIN 1/2X4 (GAUZE/BANDAGES/DRESSINGS) IMPLANT
STRIP SUTURE WOUND CLOSURE 1/2 (MISCELLANEOUS) IMPLANT
SUCTION TUBE FRAZIER 10FR DISP (SUCTIONS) IMPLANT
SUT ETHILON 4 0 PS 2 18 (SUTURE) IMPLANT
SUT MNCRL AB 4-0 PS2 18 (SUTURE) IMPLANT
SUT MON AB 3-0 SH27 (SUTURE) IMPLANT
SUT MON AB 5-0 PS2 18 (SUTURE) ×1 IMPLANT
SUT PDS II 3-0 CT2 27 ABS (SUTURE) IMPLANT
SYR BULB EAR ULCER 3OZ GRN STR (SYRINGE) IMPLANT
SYR CONTROL 10ML LL (SYRINGE) ×1 IMPLANT
TOWEL GREEN STERILE FF (TOWEL DISPOSABLE) ×1 IMPLANT
TRAY DSU PREP LF (CUSTOM PROCEDURE TRAY) ×1 IMPLANT
TUBE CONNECTING 20X1/4 (TUBING) IMPLANT
YANKAUER SUCT BULB TIP NO VENT (SUCTIONS) IMPLANT

## 2024-04-20 NOTE — Interval H&P Note (Signed)
 History and Physical Interval Note:  04/20/2024 7:45 AM  Reena ORN Kaczmarek  has presented today for surgery, with the diagnosis of lipoma shoulder.  The various methods of treatment have been discussed with the patient and family. After consideration of risks, benefits and other options for treatment, the patient has consented to  Procedure(s): EXCISION MASS, BACK (Left) as a surgical intervention.  The patient's history has been reviewed, patient examined, no change in status, stable for surgery.  I have reviewed the patient's chart and labs.  Questions were answered to the patient's satisfaction.     Estefana RAMAN Ly Bacchi

## 2024-04-20 NOTE — Discharge Instructions (Addendum)
 INSTRUCTIONS FOR AFTER SURGERY  You will likely have some questions about what to expect following your operation.  The following information will help you and your family understand what to expect when you get home.  Following these guidelines will help ensure a smooth recovery and reduce risks of complications.  Postoperative instructions include information on: diet, wound care, medications and physical activity.  AFTER SURGERY Expect to go home after the procedure.  In some cases, you may need to spend one night in the hospital for observation.  DIET This surgery does not require a specific diet.  However, the healthier you eat the better your body can heal. It is important to increasing your protein intake.  Limit foods with high sugar and  carbohydrate content.  Focus on vegetables, meat and other protein sources if you are vegan or vegetarian.  If you undergo liposuction during your procedure it is very important to drink 8 oz of water  every hour while awake for 2 days.  If your urine is bright yellow, then it is concentrated, and you need to drink more water .  If you find you are persistently nauseated or unable to take in liquids let us  know.  NO TOBACCO USE or EXPOSURE.  This will slow your healing process and increase the risk of a wound.  WOUND CARE You may have Topifoam or Lipofoam on.  It is soft and spongy and helps keep you from getting creases if you have liposuction.  This can be removed before the shower and then replaced.  If you need more it is available on [employer] as lipofoam.  If you have steri-strips / tape directly attached to your skin leave them in place. It is OK to get these wet.  No baths, pools or hot tubs for four weeks. We close your incision to leave the smallest and best-looking scar. No ointment or creams on your incisions until cleared by your surgeon.  No Neosporin (Too many skin reactions with this one).  After the steri-strips are off can use Mederma or Skinuva  and start massaging the scar. Continue to wear the binder/spanx or Ace wrap around the clock, including while sleeping, for 6 weeks. This provides added comfort and helps reduce the fluid accumulation at the surgery site.  ACTIVITY No heavy lifting until cleared by the doctor.  For example, no more than a half-gallon of milk.  It is OK to walk and you are encouraged to move your legs to help decrease your risk of getting a blood clot.  It will also help keep you from getting deconditioned.  Every 1 to 2 hours get up and walk for 5 minutes. This will help with a quicker recovery back to normal.  Let pain be your guide so you don't do too much.     SLEEPING / RESTING Be comfortable.   WORK Everyone returns to work at different times. As a rough guide, most people take 1 - 2 weeks off prior to returning to work. If you need documentation for your job give them to the front staff for processing.  DRIVING Arrange for someone to bring you home from the hospital.  You may be able to drive a few days after surgery but not while taking any narcotics or valium.  This is for your safety as well as others sharing the road with you.  BOWEL MOVEMENTS Constipation can occur after anesthesia and while taking pain medication.  It is important to stay ahead for your comfort.  We recommend taking Milk of Magnesia (2 tablespoons; twice a day) while taking the pain pills.  MEDICATIONS (you may receive and should be started after surgery) At your preoperative visit for you history and physical you were given the following medications: Antibiotic: Start this medication when you get home and take according to the instructions on the bottle. Zofran  4 mg:  This is to treat nausea and vomiting.  You can take this every 6 hours as needed and only if needed. Norco (hydrocodone /acetaminophen ) 5/325 mg:  This is only to be used after you have taken the Motrin  or the Tylenol . Every 8 hours as needed.  Over the counter  Medication to take: Ibuprofen  (Motrin ) 600 mg:  Take this every 6 hours.  If you have additional pain then take 500 mg of the Tylenol .  Only take the Norco after you have tried these two. MiraLAX  or stool softener of choice: Take this according to the bottle if you take the Norco.  WHEN TO CALL Call your surgeon's office if any of the following occur:  Fever 101 degrees F or greater  Excessive bleeding or fluid from the incision site.  Pain that increases over time without aid from the medications  Redness, warmth, or pus draining from incision sites  Persistent nausea or inability to take in liquids  Severe misshapen area that underwent the operation.   Post Anesthesia Home Care Instructions  Activity: Get plenty of rest for the remainder of the day. A responsible individual must stay with you for 24 hours following the procedure.  For the next 24 hours, DO NOT: -Drive a car -Advertising copywriter -Drink alcoholic beverages -Take any medication unless instructed by your physician -Make any legal decisions or sign important papers.  Meals: Start with liquid foods such as gelatin or soup. Progress to regular foods as tolerated. Avoid greasy, spicy, heavy foods. If nausea and/or vomiting occur, drink only clear liquids until the nausea and/or vomiting subsides. Call your physician if vomiting continues.  Special Instructions/Symptoms: Your throat may feel dry or sore from the anesthesia or the breathing tube placed in your throat during surgery. If this causes discomfort, gargle with warm salt water . The discomfort should disappear within 24 hours.  If you had a scopolamine  patch placed behind your ear for the management of post- operative nausea and/or vomiting:  1. The medication in the patch is effective for 72 hours, after which it should be removed.  Wrap patch in a tissue and discard in the trash. Wash hands thoroughly with soap and water . 2. You may remove the patch earlier than 72  hours if you experience unpleasant side effects which may include dry mouth, dizziness or visual disturbances. 3. Avoid touching the patch. Wash your hands with soap and water  after contact with the patch.

## 2024-04-20 NOTE — Anesthesia Procedure Notes (Signed)
 Procedure Name: Intubation Date/Time: 04/20/2024 8:39 AM  Performed by: Maryclare Cornet, MDPre-anesthesia Checklist: Patient identified, Emergency Drugs available, Suction available and Patient being monitored Patient Re-evaluated:Patient Re-evaluated prior to induction Oxygen Delivery Method: Circle system utilized Preoxygenation: Pre-oxygenation with 100% oxygen Induction Type: IV induction Ventilation: Mask ventilation without difficulty Laryngoscope Size: Mac and 3 Grade View: Grade I Tube type: Oral Tube size: 7.0 mm Number of attempts: 1 Airway Equipment and Method: Stylet and Oral airway Placement Confirmation: ETT inserted through vocal cords under direct vision, positive ETCO2 and breath sounds checked- equal and bilateral Secured at: 22 cm Tube secured with: Tape Dental Injury: Teeth and Oropharynx as per pre-operative assessment

## 2024-04-20 NOTE — Anesthesia Preprocedure Evaluation (Signed)
 Anesthesia Evaluation  Patient identified by MRN, date of birth, ID band Patient awake    Reviewed: Allergy  & Precautions, H&P , NPO status , Patient's Chart, lab work & pertinent test results  History of Anesthesia Complications (+) PONV and history of anesthetic complications  Airway Mallampati: II   Neck ROM: full    Dental   Pulmonary sleep apnea , former smoker   breath sounds clear to auscultation       Cardiovascular hypertension,  Rhythm:regular Rate:Normal     Neuro/Psych  PSYCHIATRIC DISORDERS Anxiety Depression       GI/Hepatic   Endo/Other  diabetes, Type 2    Renal/GU      Musculoskeletal  (+) Arthritis ,    Abdominal   Peds  Hematology   Anesthesia Other Findings   Reproductive/Obstetrics                              Anesthesia Physical Anesthesia Plan  ASA: 2  Anesthesia Plan: General   Post-op Pain Management:    Induction: Intravenous  PONV Risk Score and Plan: 4 or greater and Ondansetron , Dexamethasone , Midazolam  and Treatment may vary due to age or medical condition  Airway Management Planned: Oral ETT  Additional Equipment:   Intra-op Plan:   Post-operative Plan: Extubation in OR  Informed Consent: I have reviewed the patients History and Physical, chart, labs and discussed the procedure including the risks, benefits and alternatives for the proposed anesthesia with the patient or authorized representative who has indicated his/her understanding and acceptance.     Dental advisory given  Plan Discussed with: CRNA, Anesthesiologist and Surgeon  Anesthesia Plan Comments:         Anesthesia Quick Evaluation

## 2024-04-20 NOTE — Transfer of Care (Signed)
 Immediate Anesthesia Transfer of Care Note  Patient: Christina [name]  Procedure(s) Performed: EXCISION MASS, BACK (Left: Shoulder)  Patient Location: PACU  Anesthesia Type:General  Level of Consciousness: awake, alert , oriented, drowsy, and patient cooperative  Airway & Oxygen Therapy: Patient Spontanous Breathing and Patient connected to face mask oxygen  Post-op Assessment: Report given to RN and Post -op Vital signs reviewed and stable  Post vital signs: Reviewed and stable  Last Vitals:  Vitals Value Taken Time  BP    Temp    Pulse    Resp    SpO2      Last Pain:  Vitals:   04/20/24 0712  TempSrc: Temporal  PainSc: 0-No pain         Complications: No notable events documented.

## 2024-04-20 NOTE — Op Note (Signed)
 DATE OF OPERATION: 04/20/2024  LOCATION: [facility]  PREOPERATIVE DIAGNOSIS: Left shoulder mass  POSTOPERATIVE DIAGNOSIS: lipoma  PROCEDURE: Excision of left shoulder mass 8 x 8 cm.  SURGEON: Estefana Fritter, DO  ASSISTANT: Honora Seip, PA  EBL: 2 cc  CONDITION: Stable  COMPLICATIONS: None  INDICATION: The patient, Christina [name], is a 58 y.o. female born on Sep 07, 1966, is here for treatment of a left shoulder mass that has been getting larger over the past several years.   PROCEDURE DETAILS:  The patient was seen prior to surgery and marked.  The IV antibiotics were given. The patient was taken to the operating room and given a general anesthetic. A standard time out was performed and all information was confirmed by those in the room. SCDs were placed.   The patient was placed in the prone position.  The local was injected into the marked site.  The #15 blade was used to make a 3 cm incision in the skin.  The bovie was used to dissect to the mass.  It appeared to be a lipoma.  The lipoma was freed from the surrounding tissue with blunt dissection.  The bovie was used when needed for hemostasis.  The local was injected deep for postoperative pain control.  The deep layers were closed with the 3-0 PDS and the skin with the 3-0 Monocryl.  Derma bond and steri strips with a sterile dressing were applied. The lesion was 8 x 8 cm in size.  The patient was allowed to wake up and taken to recovery room in stable condition at the end of the case. The family was notified at the end of the case.   The advanced practice practitioner (APP) assisted throughout the case.  The APP was essential in retraction and counter traction when needed to make the case progress smoothly.  This retraction and assistance made it possible to see the tissue plans for the procedure.  The assistance was needed for blood control, tissue re-approximation and assisted with closure of the incision site.

## 2024-04-21 ENCOUNTER — Encounter (HOSPITAL_BASED_OUTPATIENT_CLINIC_OR_DEPARTMENT_OTHER): Payer: Self-pay | Admitting: Plastic Surgery

## 2024-04-21 LAB — SURGICAL PATHOLOGY

## 2024-04-21 NOTE — Anesthesia Postprocedure Evaluation (Signed)
 Anesthesia Post Note  Patient: Christina [name]  Procedure(s) Performed: EXCISION MASS, BACK (Left: Shoulder)     Patient location during evaluation: PACU Anesthesia Type: General Level of consciousness: awake and alert Pain management: pain level controlled Vital Signs Assessment: post-procedure vital signs reviewed and stable Respiratory status: spontaneous breathing, nonlabored ventilation, respiratory function stable and patient connected to nasal cannula oxygen Cardiovascular status: blood pressure returned to baseline and stable Postop Assessment: no apparent nausea or vomiting Anesthetic complications: no   No notable events documented.  Last Vitals:  Vitals:   04/20/24 1007 04/20/24 1015  BP:  (!) 166/90  Pulse: 68 72  Resp: 18 18  Temp:  (!) 36.2 C  SpO2: 97% 94%    Last Pain:  Vitals:   04/20/24 1015  TempSrc:   PainSc: 5                  Matraca Hunkins S

## 2024-04-22 ENCOUNTER — Telehealth: Payer: Self-pay

## 2024-04-22 NOTE — Telephone Encounter (Signed)
 I attempted to call the patient about her procedure results and there was no answer. I left a message for her to call the office.

## 2024-04-25 NOTE — Telephone Encounter (Signed)
 I was able to speak with the patient today. Patient confirmed DOB and I let her know the results from her surgery. The mass was a lipoma. Patient conveyed understanding.

## 2024-04-26 ENCOUNTER — Ambulatory Visit: Payer: Self-pay

## 2024-04-26 ENCOUNTER — Other Ambulatory Visit (HOSPITAL_COMMUNITY)
Admission: RE | Admit: 2024-04-26 | Discharge: 2024-04-26 | Disposition: A | Discharge status: Home / Self Care | Source: Ambulatory Visit | Attending: Obstetrics | Admitting: Obstetrics

## 2024-04-26 ENCOUNTER — Encounter: Payer: Self-pay | Admitting: Obstetrics

## 2024-04-26 ENCOUNTER — Ambulatory Visit (INDEPENDENT_AMBULATORY_CARE_PROVIDER_SITE_OTHER): Admitting: Obstetrics

## 2024-04-26 VITALS — BP 133/84 | HR 65

## 2024-04-26 DIAGNOSIS — N3281 Overactive bladder: Secondary | ICD-10-CM

## 2024-04-26 DIAGNOSIS — Z48816 Encounter for surgical aftercare following surgery on the genitourinary system: Secondary | ICD-10-CM

## 2024-04-26 DIAGNOSIS — N898 Other specified noninflammatory disorders of vagina: Secondary | ICD-10-CM | POA: Insufficient documentation

## 2024-04-26 NOTE — Patient Instructions (Addendum)
 Constipation: Our goal is to achieve formed bowel movements daily or every-other-day.  You may need to try different combinations of the following options to find what works best for you - everybody's body works differently so feel free to adjust the dosages as needed.  Some options to help maintain bowel health include:  Dietary changes (more leafy greens, vegetables and fruits; less processed foods) Fiber supplementation (Benefiber, FiberCon, Metamucil or Psyllium). Start slow and increase gradually to full dose. Over-the-counter agents such as: stool softeners (Docusate or Colace) and/or laxatives (Miralax , milk of magnesia)  Power Pudding is a natural mixture that may help your constipation.  To make blend 1 cup applesauce, 1 cup wheat bran, and 3/4 cup prune juice, refrigerate and then take 1 tablespoon daily with a large glass of water  as needed.   Women should try to eat at least 21 to 25 grams of fiber a day, while men should aim for 30 to 38 grams a day. You can add fiber to your diet with food or a fiber supplement such as psyllium (metamucil), benefiber, or fibercon.   Here's a look at how much dietary fiber is found in some common foods. When buying packaged foods, check the Nutrition Facts label for fiber content. It can vary among brands.  Fruits Serving size Total fiber (grams)*  Raspberries 1 cup 8.0  Pear 1 medium 5.5  Apple, with skin 1 medium 4.5  Banana 1 medium 3.0  Orange 1 medium 3.0  Strawberries 1 cup 3.0   Vegetables Serving size Total fiber (grams)*  Green peas, boiled 1 cup 9.0  Broccoli, boiled 1 cup chopped 5.0  Turnip greens, boiled 1 cup 5.0  Brussels sprouts, boiled 1 cup 4.0  Potato, with skin, baked 1 medium 4.0  Sweet corn, boiled 1 cup 3.5  Cauliflower, raw 1 cup chopped 2.0  Carrot, raw 1 medium 1.5   Grains Serving size Total fiber (grams)*  Spaghetti, whole-wheat, cooked 1 cup 6.0  Barley, pearled, cooked 1 cup 6.0  Bran flakes 3/4 cup 5.5   Quinoa, cooked 1 cup 5.0  Oat bran muffin 1 medium 5.0  Oatmeal, instant, cooked 1 cup 5.0  Popcorn, air-popped 3 cups 3.5  Brown rice, cooked 1 cup 3.5  Bread, whole-wheat 1 slice 2.0  Bread, rye 1 slice 2.0   Legumes, nuts and seeds Serving size Total fiber (grams)*  Split peas, boiled 1 cup 16.0  Lentils, boiled 1 cup 15.5  Black beans, boiled 1 cup 15.0  Baked beans, canned 1 cup 10.0  Chia seeds 1 ounce 10.0  Almonds 1 ounce (23 nuts) 3.5  Pistachios 1 ounce (49 nuts) 3.0  Sunflower kernels 1 ounce 3.0  *Rounded to nearest 0.5 gram. Source: [employer] for [employer], Legacy Release    Avoid straining.   Continue to monitor caffeine use and fluid management.

## 2024-04-26 NOTE — Progress Notes (Signed)
 [facility]  Date of Visit: 04/26/2024  History of Present Illness: Christina [name] is a 58 y.o. female scheduled today for a post-operative visit.   Surgery: s/p Exam under anesthesia, anterior repair, posterior repair, perineoplasty, bladder biopsy and fulguration of bladder lesion, injection of urethral bulking agent, and cystoscopy on 03/14/24  She passed her postoperative void trial.   Postoperative course was uncomplicated.   Today she reports doing well and would like to resume swimming due to caring for 8yo grandchildren  UTI in the last 6 weeks? No , reports intermittent itching with some spotting after bowel movements. Denies pad use Pain? No  She has returned to her normal activity (except for postop restrictions) Vaginal bulge? No  Stress incontinence: No  Urgency/frequency: No , voids 5-6x/day Urge incontinence: Yes , UUI 1x/week down from down from baseline UUI 3-4x/day Did not start mirabegron  25mg  due to insurance Tried oxybutynin  without relief with dry mouth, eyes and constipation Tried Gemtesa  but ran out after completion of samples prior to surgery Voiding dysfunction: No  Bowel issues: intermittent miralax  use,history of IBS-C with prior intermittent Linzess use. Reports rare straining with BM 4-5x/week  Drinks: 4-6 Diet Pepsi per day reduced to 20oz/day, increased to 16oz water  per day   Subjective Success: Do you usually have a bulge or something falling out that you can see or feel in the vaginal area? No  Retreatment Success: Any retreatment with surgery or pessary for any compartment? No   Pathology results:  A. BLADDER DOME, RIGHT, BIOPSY:  - Benign urothelial mucosa, negative for carcinoma   Medications: She has a current medication list which includes the following prescription(s): cromolyn, hydrochlorothiazide , hydroxyzine , ibuprofen , and metformin .   Allergies: Patient has no known allergies.   Physical Exam: BP 133/84   Pulse 65   LMP   (LMP Unknown)   Abdomen: soft, non-tender, without masses or organomegaly Pelvic Examination: Vagina: Incisions healing well. Sutures are not present at incision line and there is not granulation tissue. No tenderness along the anterior or posterior vagina. No apical tenderness. No pelvic masses. Superficial separation around 2mm at posterior fourchette. No active bleeding or pooling of fluid at vaginal apex  POP-Q: POP-Q  -3                                            Aa   -3                                           Ba  -7                                              C   4                                            Gh  5  Pb  8                                            tvl   -2                                            Ap  -2                                            Bp                                                 D    ---------------------------------------------------------  Assessment and Plan:  1. Vaginal itching   2. OAB (overactive bladder)    Vaginal itching Assessment & Plan: - minimal discharge on exam with intermittent symptoms - pending Nuswab to r/o infectious etiology  Orders: -     Cervicovaginal ancillary only  OAB (overactive bladder) Assessment & Plan: - encouraged to continue caffeine reduction and fluid management - Trial of gemtesa  for 8 weeks with minimal relief - Tried oxybutynin  without relief with dry mouth, eyes and constipation. Mirabegron  cost prohibitive - improved since surgery per pt, continue to monitor  - We discussed the symptoms of overactive bladder (OAB), which include urinary urgency, urinary frequency, nocturia, with or without urge incontinence.  While we do not know the exact etiology of OAB, several treatment options exist. We discussed management including behavioral therapy (decreasing bladder irritants, urge suppression strategies, timed voids, bladder retraining), physical  therapy, medication; for refractory cases posterior tibial nerve stimulation, sacral neuromodulation, and intravesical botulinum toxin injection.  For anticholinergic medications, we discussed the potential side effects of anticholinergics including dry eyes, dry mouth, constipation, cognitive impairment and urinary retention. For Beta-3 agonist medication, we discussed the potential side effect of elevated blood pressure which is more likely to occur in individuals with uncontrolled hypertension. - discussed possible need for additional OAB treatment if persistent or worsening symptoms at follow-up, consider Trospium. Declines additional treatments at this time   - Pathology results were reviewed with the patient today and she verbalized understanding that the results were benign.  - Can resume regular activity including exercise and intercourse,  if desired.  - Discussed avoidance of heavy lifting and straining long term to reduce the risk of recurrence. Encouraged fiber supplementation to reduce straining with bowel movements.  All questions answered.   Return in about 6 weeks (around 06/07/2024).

## 2024-04-26 NOTE — Assessment & Plan Note (Addendum)
-   minimal discharge on exam with intermittent symptoms - pending Nuswab to r/o infectious etiology

## 2024-04-26 NOTE — Telephone Encounter (Signed)
 FYI Only or Action Required?: Action required by provider: request for appointment.  Patient was last seen in primary care on 11/18/2023 by Chandra Toribio POUR, MD.  Called Nurse Triage reporting Breathing Problem and Depression.  Symptoms began several months ago.  Interventions attempted: Prescription medications: states hydroxyzine  is not working like it use to, pt states that she has been off her pristiq  for about 2 months.  Symptoms are: gradually worsening.  Triage Disposition: See PCP When Office is Open (Within 3 Days)  Patient/caregiver understands and will follow disposition?: Yes, will follow disposition  Copied from CRM #[id number]. Topic: Clinical - Red Word Triage >> Apr 26, 2024  9:08 AM Essie A wrote: Red Word that prompted transfer to Nurse Triage: Suffering with anxiety, depression, have trouble breathing at night when laying down.  Been going on for about a month. Reason for Disposition  MODERATE anxiety (e.g., persistent or frequent anxiety symptoms; interferes with sleep, school, or work)  Answer Assessment - Initial Assessment Questions 1. CONCERN: Did anything happen that prompted you to call today?      Unbelievable amount of stress in life 2. ANXIETY SYMPTOMS: Can you describe how you (your loved one; patient) have been feeling? (e.g., tense, restless, panicky, anxious, keyed up, overwhelmed, sense of impending doom).      Anxious, tense, restless 3. ONSET: How long have you been feeling this way? (e.g., hours, days, weeks)     About 2 months when stopped her Pristiq  4. SEVERITY: How would you rate the level of anxiety? (e.g., 0 - 10; or mild, moderate, severe).     Severe when she is sleeping 5. FUNCTIONAL IMPAIRMENT: How have these feelings affected your ability to do daily activities? Have you had more difficulty than usual doing your normal daily activities? (e.g., getting better, same, worse; self-care, school, work, interactions)     Denies  impairment 6. HISTORY: Have you felt this way before? Have you ever been diagnosed with an anxiety problem in the past? (e.g., generalized anxiety disorder, panic attacks, PTSD). If Yes, ask: How was this problem treated? (e.g., medicines, counseling, etc.)     Hx of using pristiq  7. RISK OF HARM - SUICIDAL IDEATION: Do you ever have thoughts of hurting or killing yourself? If Yes, ask:  Do you have these feelings now? Do you have a plan on how you would do this?     Denies SI/HI 8. TREATMENT:  What has been done so far to treat this anxiety? (e.g., medicines, relaxation strategies). What has helped?     Pristiq  was helping 10. POTENTIAL TRIGGERS: Do you drink caffeinated beverages (e.g., coffee, colas, teas), and how much daily? Do you drink alcohol or use any drugs? Have you started any new medicines recently?       Stressors at this time in life.  11. PATIENT SUPPORT: Who is with you now? Who do you live with? Do you have family or friends who you can talk to?        Yes good support system 12. OTHER SYMPTOMS: Do you have any other symptoms? (e.g., feeling depressed, trouble concentrating, trouble sleeping, trouble breathing, palpitations or fast heartbeat, chest pain, sweating, nausea, or diarrhea)       Trouble concentrating, trouble sleeping,    Pt states that she was taking pristiq , states she stopped taking it without doctor advisement, states that she stopped taking it abruptly. States that s/s of anxiety and depression started at that time and have been ongoing since.  Answer  Assessment - Initial Assessment Questions 1. RESPIRATORY STATUS: Describe your breathing? (e.g., wheezing, shortness of breath, unable to speak, severe coughing)      Supine SOB 2. ONSET: When did this breathing problem begin?      About a month and half 3. PATTERN Does the difficult breathing come and go, or has it been constant since it started?      Only at noc 4.  SEVERITY: How bad is your breathing? (e.g., mild, moderate, severe)      Severe when it occurs, states she wakes up gasping for air 5. RECURRENT SYMPTOM: Have you had difficulty breathing before? If Yes, ask: When was the last time? and What happened that time?      denies 6. CARDIAC HISTORY: Do you have any history of heart disease? (e.g., heart attack, angina, bypass surgery, angioplasty)      denies 7. LUNG HISTORY: Do you have any history of lung disease?  (e.g., pulmonary embolus, asthma, emphysema)     OSA,  8. CAUSE: What do you think is causing the breathing problem?      Unsure, pt is concerned that it could be anxiety 9. OTHER SYMPTOMS: Do you have any other symptoms? (e.g., chest pain, cough, dizziness, fever, runny nose)     Cough states she has had the cough for months 12. TRAVEL: Have you traveled out of the country in the last month? (e.g., travel history, exposures)       denies  Pt states that she has CPAP machine but states that she has not been using it because she feels like she cannot get enough air in. Pt started using CPAP in April.  Protocols used: Breathing Difficulty-A-AH, Anxiety and Panic Attack-A-AH

## 2024-04-26 NOTE — Assessment & Plan Note (Signed)
-   encouraged to continue caffeine reduction and fluid management - Trial of gemtesa  for 8 weeks with minimal relief - Tried oxybutynin  without relief with dry mouth, eyes and constipation. Mirabegron  cost prohibitive - improved since surgery per pt, continue to monitor  - We discussed the symptoms of overactive bladder (OAB), which include urinary urgency, urinary frequency, nocturia, with or without urge incontinence.  While we do not know the exact etiology of OAB, several treatment options exist. We discussed management including behavioral therapy (decreasing bladder irritants, urge suppression strategies, timed voids, bladder retraining), physical therapy, medication; for refractory cases posterior tibial nerve stimulation, sacral neuromodulation, and intravesical botulinum toxin injection.  For anticholinergic medications, we discussed the potential side effects of anticholinergics including dry eyes, dry mouth, constipation, cognitive impairment and urinary retention. For Beta-3 agonist medication, we discussed the potential side effect of elevated blood pressure which is more likely to occur in individuals with uncontrolled hypertension. - discussed possible need for additional OAB treatment if persistent or worsening symptoms at follow-up, consider Trospium. Declines additional treatments at this time

## 2024-04-27 LAB — CERVICOVAGINAL ANCILLARY ONLY
Bacterial Vaginitis (gardnerella): NEGATIVE
Candida Glabrata: NEGATIVE
Candida Vaginitis: NEGATIVE
Comment: NEGATIVE
Comment: NEGATIVE
Comment: NEGATIVE

## 2024-04-28 ENCOUNTER — Ambulatory Visit

## 2024-04-28 ENCOUNTER — Ambulatory Visit (INDEPENDENT_AMBULATORY_CARE_PROVIDER_SITE_OTHER)

## 2024-04-28 ENCOUNTER — Ambulatory Visit: Payer: Self-pay | Admitting: Obstetrics

## 2024-04-28 ENCOUNTER — Ambulatory Visit: Payer: Self-pay | Admitting: *Deleted

## 2024-04-28 VITALS — BP 119/73 | HR 69 | Temp 97.7°F | Ht 67.0 in | Wt 224.0 lb

## 2024-04-28 DIAGNOSIS — G4733 Obstructive sleep apnea (adult) (pediatric): Secondary | ICD-10-CM

## 2024-04-28 DIAGNOSIS — F411 Generalized anxiety disorder: Secondary | ICD-10-CM

## 2024-04-28 DIAGNOSIS — D229 Melanocytic nevi, unspecified: Secondary | ICD-10-CM

## 2024-04-28 MED ORDER — ZEPBOUND 2.5 MG/0.5ML ~~LOC~~ SOAJ: 2.5000 mg | SUBCUTANEOUS | 0 refills | Status: DC

## 2024-04-28 MED ORDER — DESVENLAFAXINE SUCCINATE ER 50 MG PO TB24: 50.0000 mg | ORAL_TABLET | Freq: Every day | ORAL | 2 refills | Status: DC

## 2024-04-28 MED ORDER — DESVENLAFAXINE SUCCINATE ER 25 MG PO TB24: 25.0000 mg | ORAL_TABLET | Freq: Every day | ORAL | 0 refills | Status: DC

## 2024-04-28 MED ORDER — HYDROXYZINE PAMOATE 100 MG PO CAPS: 100.0000 mg | ORAL_CAPSULE | Freq: Three times a day (TID) | ORAL | 2 refills | Status: DC | PRN

## 2024-04-28 NOTE — Assessment & Plan Note (Addendum)
 Exacerbation of anxiety symptoms, worsened by stress and discontinuation of Pristiq . Hydroxyzine  used for symptom management. - Restart Pristiq : 25 mg daily for 7 days, then 50 mg daily for 2 weeks, consider 75 mg daily if needed. - Continue hydroxyzine , increase to 100 mg as needed, up to 3 times a day. - Discuss Zepbound  to aid in managing obstructive sleep apnea and  subsequent anxiety around breathing at night time. Expressed importance regarding the combination of this medication with increased exercise (walking) and low calorie diet (<1800 calories per day).  - Educated on medication adherence and gradual titration.

## 2024-04-28 NOTE — Telephone Encounter (Signed)
 Appt was kept today.

## 2024-04-28 NOTE — Telephone Encounter (Signed)
 Reason for Disposition . [1] Caller requesting NON-URGENT health information AND [2] PCP's office is the best resource  Answer Assessment - Initial Assessment Questions Recommended if sx return chest discomfort anxiety / depression go to ED for evaluation. Reviewed with patient NT will send requesting to provider to keep appt for today that she does have transportation.   Attempted to contact [employer] for assist and unsuccessful.     1. REASON FOR CALL: What is the main reason for your call? or How can I best help you?     Wants to keep appt today due to getting transportation 2. SYMPTOMS : Do you have any symptoms?      Chest discomfort with anxiety episodes  3. OTHER QUESTIONS: Do you have any other questions?     Can patient keep appt today . Canceled before getting transportation issues confirmed.  Protocols used: Information Only Call - No Triage-A-AH

## 2024-04-28 NOTE — Progress Notes (Signed)
 Established Patient Office Visit  Subjective   Patient ID: Christina [name], female    DOB: [birth year]  Age: 58 y.o. MRN: [id number]  Chief Complaint  Patient presents with   Medical Management of Chronic Issues    Depression    HPI  History of Present Illness   Christina [name] is a 58 year old female with anxiety and sleep apnea who presents with worsening anxiety and breathing difficulties.  Anxiety symptoms - Significant anxiety, unprecedented in severity, worsening over the summer - Increased stress attributed to caring for four grandchildren (all under age 77) and daughters' struggles with addiction - Abrupt discontinuation of Pristiq  75 mg approximately one month ago, without medical advice - No resumption of Pristiq  since discontinuation as she wanted to make sure it was safe to restart it.  Dyspnea and nocturnal respiratory symptoms - Episodes of dyspnea, particularly when lying down - Nocturnal awakening due to shortness of breath, relieved by sitting up - No shortness of breath during ambulation or daytime - No cold symptoms or productive cough - Hydroxyzine  50 mg used primarily at night due to anxiety and associated breathing difficulties, for the past three to four days, which she reports has helped.   Obstructive sleep apnea and cpap nonadherence - History of obstructive sleep apnea - CPAP machine available but not used recently due to perception of inadequate airflow - Would like to discuss other treatment options for OSA   Cutaneous lesions - Concern regarding suspicious moles, particularly one that is raised and scabbed over on her chest  - No rapid changes in the lesions - Lesions first noticed after a recent trip to the beach       ROS Per HPI.    Objective:     BP 119/73   Pulse 69   Temp 97.7 F (36.5 C) (Oral)   Ht 5' 7 (1.702 m)   Wt 224 lb 0.6 oz (101.6 kg)   LMP  (LMP Unknown)   SpO2 96%   BMI 35.09 kg/m    Physical  Exam Constitutional:      General: She is not in acute distress.    Appearance: Normal appearance.  Cardiovascular:     Rate and Rhythm: Normal rate and regular rhythm.     Heart sounds: Normal heart sounds. No murmur heard.    No friction rub. No gallop.  Pulmonary:     Effort: Pulmonary effort is normal. No respiratory distress.     Breath sounds: Normal breath sounds.  Musculoskeletal:        General: No swelling.  Skin:    General: Skin is warm and dry.     Comments: Small red, raised papule on chest with superficial scabbing  Neurological:     General: No focal deficit present.     Mental Status: She is alert.  Psychiatric:        Mood and Affect: Mood normal.        Behavior: Behavior normal.        Thought Content: Thought content normal.    No results found for any visits on 04/28/24.    The 10-year ASCVD risk score (Arnett DK, et al., 2019) is: 4.6%    Assessment & Plan:   Change in multiple nevi -     Ambulatory referral to Dermatology  GAD (generalized anxiety disorder) Assessment & Plan: Exacerbation of anxiety symptoms, worsened by stress and discontinuation of Pristiq . Hydroxyzine  used for symptom management. - Restart Pristiq : 25 mg  daily for 7 days, then 50 mg daily for 2 weeks, consider 75 mg daily if needed. - Continue hydroxyzine , increase to 100 mg as needed, up to 3 times a day. - Discuss Zepbound  to aid in managing obstructive sleep apnea and  subsequent anxiety around breathing at night time. Expressed importance regarding the combination of this medication with increased exercise (walking) and low calorie diet (<1800 calories per day).  - Educated on medication adherence and gradual titration.   OSA (obstructive sleep apnea) Assessment & Plan: Difficulty with CPAP use due to sensation of insufficient air, contributing to anxiety and nocturnal awakenings. - Encourage CPAP use while sitting up or with head elevated. - Discuss Zepbound  to improve  symptoms. Referred to [employer] for insurance coverage assistance. - Discussed importance of combining this medication with low calorie diet (<1800 calories per day) and increased exercise (at least 30 min per day) for best results in treatment of OSA. Patient verbalized understanding and was in agreement with the plan.   Orders: -     Zepbound ; Inject 2.5 mg into the skin once a week.  Dispense: 2 mL; Refill: 0  Other orders -     hydrOXYzine  Pamoate; Take 1 capsule (100 mg total) by mouth 3 (three) times daily as needed for itching.  Dispense: 30 capsule; Refill: 2 -     Desvenlafaxine  Succinate ER; Take 1 tablet (25 mg total) by mouth daily.  Dispense: 7 tablet; Refill: 0 -     Desvenlafaxine  Succinate ER; Take 1 tablet (50 mg total) by mouth daily.  Dispense: 30 tablet; Refill: 2  No follow-ups on file.    Saddie JULIANNA Sacks, PA-C

## 2024-04-28 NOTE — Patient Instructions (Signed)
 VISIT SUMMARY: During today's visit, we discussed your worsening anxiety, breathing difficulties, and concerns about a suspicious skin lesion. We reviewed your symptoms and made a plan to address each issue.  YOUR PLAN: -GENERALIZED ANXIETY DISORDER: Generalized anxiety disorder is a condition characterized by excessive worry and anxiety. Your anxiety has worsened due to stress and stopping your medication. We will restart Pristiq  at 25 mg daily for 7 days, then increase to 50 mg daily for 30 days. Continue taking hydroxyzine , and you can increase the dose to 100 mg as needed, up to three times a day. We also discussed the potential benefits of Zepbound  for weight loss, which may help manage your anxiety and sleep apnea. Please adhere to the medication schedule and titrate gradually.  -OBSTRUCTIVE SLEEP APNEA: Obstructive sleep apnea is a condition where your breathing stops and starts during sleep. You have had difficulty using your CPAP machine due to feeling like there isn't enough airflow. Try using the CPAP while sitting up or with your head elevated. We also discussed Zepbound  for weight loss, which could improve your symptoms. You have been referred to [facility] for assistance with insurance coverage.  -COUGH: Your intermittent cough may be related to anxiety, but we need to rule out other causes. Monitor your cough after restarting Pristiq , and we will evaluate your lung sounds to ensure there are no other issues.  -SKIN LESION SUSPICIOUS FOR MALIGNANCY: You have a raised and scabbed skin lesion that could be concerning for skin cancer. We will refer you to dermatology for a thorough evaluation.  INSTRUCTIONS: Please follow up with dermatology for the evaluation of your skin lesion. Continue using your CPAP machine as discussed, and monitor your cough after restarting Pristiq . If you have any concerns or your symptoms worsen, contact our office.  If you have any problems before your next  visit feel free to message me via MyChart (minor issues or questions) or call the office, otherwise you may reach out to schedule an office visit.  Thank you! Saddie Sacks, PA-C

## 2024-04-28 NOTE — Progress Notes (Deleted)
 Patient is a pleasant 58 year old female s/p excision of left shoulder lipoma performed 04/20/2024 by Dr. Lowery who returns for postoperative follow-up.  Reviewed operative report and layered closure was performed.  Incision dressed with Dermabond, Steri-Strips, and sterile dressing.  Today,

## 2024-04-28 NOTE — Telephone Encounter (Addendum)
 Called patient back to inform patient CAL able to add appt back on for today at 3:30 pm with PCP due to patient getting transportation . Patient verbalized understanding.     FYI Only or Action Required?: Action required by provider: request for appointment and to keep appt today that was cancelled and now patient has transportation.  Patient was last seen in primary care on 11/18/2023 by Chandra Toribio POUR, MD.  Called Nurse Triage reporting Advice Only.  Symptoms began today.  Interventions attempted: Other: na.  Symptoms are: unchanged.  Triage Disposition: Call PCP When Office is Open  Patient/caregiver understands and will follow disposition?: No, wishes to speak with PCP

## 2024-04-28 NOTE — Assessment & Plan Note (Signed)
 Difficulty with CPAP use due to sensation of insufficient air, contributing to anxiety and nocturnal awakenings. - Encourage CPAP use while sitting up or with head elevated. - Discuss Zepbound  to improve symptoms. Referred to [employer] for insurance coverage assistance. - Discussed importance of combining this medication with low calorie diet (<1800 calories per day) and increased exercise (at least 30 min per day) for best results in treatment of OSA. Patient verbalized understanding and was in agreement with the plan.

## 2024-04-29 ENCOUNTER — Other Ambulatory Visit: Payer: Self-pay

## 2024-04-29 ENCOUNTER — Encounter: Admitting: Physician Assistant

## 2024-04-29 DIAGNOSIS — G4733 Obstructive sleep apnea (adult) (pediatric): Secondary | ICD-10-CM

## 2024-05-04 ENCOUNTER — Other Ambulatory Visit: Payer: Self-pay

## 2024-05-04 DIAGNOSIS — E559 Vitamin D deficiency, unspecified: Secondary | ICD-10-CM

## 2024-05-04 DIAGNOSIS — E119 Type 2 diabetes mellitus without complications: Secondary | ICD-10-CM

## 2024-05-04 DIAGNOSIS — E66811 Obesity, class 1: Secondary | ICD-10-CM

## 2024-05-04 NOTE — Progress Notes (Unsigned)
 Patient is a pleasant 58 year old female s/p excision of left shoulder lipoma performed 04/20/2024 by Dr. Lowery who returns for postoperative follow-up.  Reviewed operative report and layered closure was performed.  Incision dressed with Dermabond, Steri-Strips, and sterile dressing.  Today,

## 2024-05-05 ENCOUNTER — Ambulatory Visit: Admitting: Physician Assistant

## 2024-05-05 VITALS — BP 115/73 | HR 67

## 2024-05-05 DIAGNOSIS — D171 Benign lipomatous neoplasm of skin and subcutaneous tissue of trunk: Secondary | ICD-10-CM

## 2024-05-10 ENCOUNTER — Other Ambulatory Visit

## 2024-05-10 DIAGNOSIS — E66811 Obesity, class 1: Secondary | ICD-10-CM | POA: Diagnosis not present

## 2024-05-10 DIAGNOSIS — E559 Vitamin D deficiency, unspecified: Secondary | ICD-10-CM

## 2024-05-10 DIAGNOSIS — E119 Type 2 diabetes mellitus without complications: Secondary | ICD-10-CM

## 2024-05-11 ENCOUNTER — Ambulatory Visit: Payer: Self-pay

## 2024-05-11 LAB — LIPID PANEL
Chol/HDL Ratio: 4.2 ratio (ref 0.0–4.4)
Cholesterol, Total: 133 mg/dL (ref 100–199)
HDL: 32 mg/dL — ABNORMAL LOW (ref >39)
LDL Chol Calc (NIH): 82 mg/dL (ref 0–99)
Triglycerides: 99 mg/dL (ref 0–149)
VLDL Cholesterol Cal: 19 mg/dL (ref 5–40)

## 2024-05-11 LAB — COMPREHENSIVE METABOLIC PANEL WITH GFR
ALT: 90 IU/L — ABNORMAL HIGH (ref 0–32)
AST: 63 IU/L — ABNORMAL HIGH (ref 0–40)
Albumin: 4.4 g/dL (ref 3.8–4.9)
Alkaline Phosphatase: 88 IU/L (ref 44–121)
BUN/Creatinine Ratio: 25 — ABNORMAL HIGH (ref 9–23)
BUN: 15 mg/dL (ref 6–24)
Bilirubin Total: 0.5 mg/dL (ref 0.0–1.2)
CO2: 21 mmol/L (ref 20–29)
Calcium: 10.3 mg/dL — ABNORMAL HIGH (ref 8.7–10.2)
Chloride: 105 mmol/L (ref 96–106)
Creatinine, Ser: 0.6 mg/dL (ref 0.57–1.00)
Globulin, Total: 2.9 g/dL (ref 1.5–4.5)
Glucose: 118 mg/dL — ABNORMAL HIGH (ref 70–99)
Potassium: 4.9 mmol/L (ref 3.5–5.2)
Sodium: 141 mmol/L (ref 134–144)
Total Protein: 7.3 g/dL (ref 6.0–8.5)
eGFR: 105 mL/min/1.73 (ref >59)

## 2024-05-11 LAB — CBC WITH DIFFERENTIAL/PLATELET
Basophils Absolute: 0 x10E3/uL (ref 0.0–0.2)
Basos: 0 %
EOS (ABSOLUTE): 0.2 x10E3/uL (ref 0.0–0.4)
Eos: 3 %
Hematocrit: 44.5 % (ref 34.0–46.6)
Hemoglobin: 14.1 g/dL (ref 11.1–15.9)
Immature Grans (Abs): 0 x10E3/uL (ref 0.0–0.1)
Immature Granulocytes: 0 %
Lymphocytes Absolute: 2.6 x10E3/uL (ref 0.7–3.1)
Lymphs: 35 %
MCH: 27 pg (ref 26.6–33.0)
MCHC: 31.7 g/dL (ref 31.5–35.7)
MCV: 85 fL (ref 79–97)
Monocytes Absolute: 0.6 x10E3/uL (ref 0.1–0.9)
Monocytes: 8 %
Neutrophils Absolute: 3.9 x10E3/uL (ref 1.4–7.0)
Neutrophils: 54 %
Platelets: 341 x10E3/uL (ref 150–450)
RBC: 5.22 x10E6/uL (ref 3.77–5.28)
RDW: 13.4 % (ref 11.7–15.4)
WBC: 7.3 x10E3/uL (ref 3.4–10.8)

## 2024-05-11 LAB — HEMOGLOBIN A1C
Est. average glucose Bld gHb Est-mCnc: 157 mg/dL
Hgb A1c MFr Bld: 7.1 % — ABNORMAL HIGH (ref 4.8–5.6)

## 2024-05-11 LAB — VITAMIN D 25 HYDROXY (VIT D DEFICIENCY, FRACTURES): Vit D, 25-Hydroxy: 21.4 ng/mL — ABNORMAL LOW (ref 30.0–100.0)

## 2024-05-11 LAB — TSH: TSH: 2.86 u[IU]/mL (ref 0.450–4.500)

## 2024-05-13 ENCOUNTER — Encounter: Admitting: Physician Assistant

## 2024-05-17 ENCOUNTER — Ambulatory Visit

## 2024-05-20 DIAGNOSIS — Z419 Encounter for procedure for purposes other than remedying health state, unspecified: Secondary | ICD-10-CM | POA: Diagnosis not present

## 2024-05-27 ENCOUNTER — Encounter: Admitting: Physician Assistant

## 2024-05-27 ENCOUNTER — Emergency Department (HOSPITAL_COMMUNITY)
Admission: EM | Admit: 2024-05-27 | Discharge: 2024-05-27 | Disposition: A | Discharge status: Home / Self Care | Attending: Emergency Medicine | Admitting: Emergency Medicine

## 2024-05-27 ENCOUNTER — Emergency Department (HOSPITAL_COMMUNITY)

## 2024-05-27 DIAGNOSIS — M5416 Radiculopathy, lumbar region: Secondary | ICD-10-CM | POA: Insufficient documentation

## 2024-05-27 DIAGNOSIS — M47816 Spondylosis without myelopathy or radiculopathy, lumbar region: Secondary | ICD-10-CM | POA: Diagnosis not present

## 2024-05-27 DIAGNOSIS — M545 Low back pain, unspecified: Secondary | ICD-10-CM | POA: Diagnosis present

## 2024-05-27 DIAGNOSIS — M5126 Other intervertebral disc displacement, lumbar region: Secondary | ICD-10-CM | POA: Diagnosis not present

## 2024-05-27 MED ORDER — HYDROCODONE-ACETAMINOPHEN 5-325 MG PO TABS: 1.0000 | ORAL_TABLET | Freq: Four times a day (QID) | ORAL | 0 refills | Status: AC | PRN

## 2024-05-27 MED ORDER — PREDNISONE 10 MG PO TABS: ORAL_TABLET | ORAL | 0 refills | Status: DC

## 2024-05-27 NOTE — ED Provider Notes (Signed)
 [place] EMERGENCY DEPARTMENT AT [facility] Provider Note   CSN: [id number] Arrival date & time: 05/27/24  1225     Patient presents with: Back Pain   Christina [name] is a 58 y.o. female.   Pt complains of low back pain.  Pt reports she has pain in her right low back that radiates down her right leg.  The history is provided by the patient. No language interpreter was used.  Back Pain Location:  Lumbar spine Quality:  Aching Radiates to:  R thigh Pain severity:  Moderate Pain is:  Same all the time Timing:  Constant Progression:  Worsening Chronicity:  New Relieved by:  Nothing Worsened by:  Nothing Ineffective treatments:  None tried Associated symptoms: no dysuria        Prior to Admission medications   Medication Sig Start Date End Date Taking? Authorizing Provider  cromolyn (OPTICROM) 4 % ophthalmic solution SMARTSIG:1 Drop(s) In Eye(s) Twice Daily PRN 11/30/23   [provider]  desvenlafaxine  (PRISTIQ ) 50 MG 24 hr tablet Take 1 tablet (50 mg total) by mouth daily. 04/28/24   Gayle Saddie FALCON, PA-C  hydrochlorothiazide  (HYDRODIURIL ) 12.5 MG tablet Take 1 tablet (12.5 mg total) by mouth daily. 09/08/23   Wallace Joesph LABOR, PA  hydrOXYzine  (VISTARIL ) 100 MG capsule Take 1 capsule (100 mg total) by mouth 3 (three) times daily as needed for itching. 04/28/24   Gayle Saddie FALCON, PA-C  ibuprofen  (ADVIL ) 600 MG tablet Take 1 tablet (600 mg total) by mouth every 6 (six) hours as needed. 02/25/24   Zuleta, Kaitlin G, NP  metFORMIN  (GLUCOPHAGE ) 500 MG tablet Take 1 tablet (500 mg total) by mouth daily with breakfast. 02/08/24   Gayle, Kara F, PA-C  semaglutide -weight management (WEGOVY ) 0.25 MG/0.5ML SOAJ SQ injection Inject 0.25 mg into the skin once a week. 05/03/24   Gayle Saddie FALCON, PA-C    Allergies: Patient has no known allergies.    Review of Systems  Genitourinary:  Negative for dysuria.  Musculoskeletal:  Positive for back pain.  All other systems  reviewed and are negative.   Updated Vital Signs BP (!) 147/84 (BP Location: Left Arm)   Pulse 70   Temp 98.5 F (36.9 C) (Oral)   Resp 18   LMP  (LMP Unknown)   SpO2 98%   Physical Exam Vitals reviewed.  Constitutional:      Appearance: Normal appearance.  HENT:     Head: Normocephalic.     Mouth/Throat:     Mouth: Mucous membranes are moist.  Eyes:     Extraocular Movements: Extraocular movements intact.     Pupils: Pupils are equal, round, and reactive to light.  Cardiovascular:     Rate and Rhythm: Normal rate and regular rhythm.  Pulmonary:     Effort: Pulmonary effort is normal.     Breath sounds: Normal breath sounds.  Musculoskeletal:        General: Normal range of motion.     Comments: Tender lower lumbar spine  from  nv and ns intact   Skin:    General: Skin is warm.  Neurological:     General: No focal deficit present.     Mental Status: She is alert.  Psychiatric:        Mood and Affect: Mood normal.     (all labs ordered are listed, but only abnormal results are displayed) Labs Reviewed - No data to display  EKG: None  Radiology: DG Lumbar Spine Complete Result  Date: 05/27/2024 EXAM: 4 VIEW(S) XRAY OF THE LUMBAR SPINE 05/27/2024 02:02:18 PM COMPARISON: None available. CLINICAL HISTORY: Back pain. Patient in today reporting right sided back pain radiating down in hip. No injury. FINDINGS: LUMBAR SPINE: BONES: No acute fracture. No aggressive appearing osseous lesion. Conventional lumbosacral anatomy is assumed with 5 non-rib-bearing, lumbar-type vertebral bodies. 5 mm retrolisthesis of L3 on L4. DISCS AND DEGENERATIVE CHANGES: Moderate disc height loss at L2-3 and L3-4. Lower lumbar facet arthropathy. SOFT TISSUES: Vascular calcifications. No acute abnormality. IMPRESSION: 1. 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 Electronically signed by: Ryan Chess MD 05/27/2024 02:25 PM EDT RP Workstation: [id number]     Procedures    Medications Ordered in the ED - No data to display                                  Medical Decision Making Pt complains of low back pain that goes down her right leg.   Amount and/or Complexity of Data Reviewed Radiology: ordered and independent interpretation performed. Decision-making details documented in ED Course.    Details: LS-spine x-ray shows multilevel spondylosis with 5 mm retrolisthesis of L3 on L4  Risk Prescription drug management. Risk Details: Pt complains of low back pain that radiates down her right leg.  Pt given prescription for prednisone  and hydrocodone          Final diagnoses:  Acute right-sided low back pain without sciatica  Lumbar radiculopathy    ED Discharge Orders          Ordered    predniSONE  (DELTASONE ) 10 MG tablet       Note to Pharmacy: Please provide 6 day taper dosepack   05/27/24 1441    HYDROcodone -acetaminophen  (NORCO/VICODIN) 5-325 MG tablet  Every 6 hours PRN        05/27/24 1441            An After Visit Summary was printed and given to the patient.    Flint Sonny POUR, [place] 05/27/24 1442    Dasie Faden, MD 05/30/24 [phone]

## 2024-05-27 NOTE — ED Triage Notes (Signed)
 Patient in today reporting right sided back pain radiating down in hip.

## 2024-05-27 NOTE — Discharge Instructions (Addendum)
See your Physician for recheck in 3-4 days.  Return if any problems.  °

## 2024-05-30 ENCOUNTER — Encounter: Admitting: Student

## 2024-05-30 ENCOUNTER — Ambulatory Visit: Payer: Self-pay | Admitting: *Deleted

## 2024-05-30 ENCOUNTER — Ambulatory Visit: Admitting: Family Medicine

## 2024-05-30 NOTE — Telephone Encounter (Signed)
 Copied from CRM [id number]. Topic: Clinical - Red Word Triage >> May 30, 2024  8:57 AM Delon HERO wrote: Red Word that prompted transfer to Nurse Triage: Patient is calling with back pain that started over a week a go. Went to the ED on Friday. Calling to schedule follow up as she is still in pain. Reason for Disposition  [1] MODERATE back pain (e.g., interferes with normal activities) AND [2] present > 3 days  Answer Assessment - Initial Assessment Questions 1. ONSET: When did the pain begin? (e.g., minutes, hours, days)     I was seen in ED on Friday.   They told me to f/u with my PCP. I have back pain.    It's gotten better but it's still there.   I have lumbar disc out of place or something.   It's slid over.  2. LOCATION: Where does it hurt? (upper, mid or lower back)     Lumbar area lower back.  3. SEVERITY: How bad is the pain?  (e.g., Scale 1-10; mild, moderate, or severe)     Still in pain but it's better 4. PATTERN: Is the pain constant? (e.g., yes, no; constant, intermittent)      It's constant but not as bad as it was. 5. RADIATION: Does the pain shoot into your legs or somewhere else?     No 6. CAUSE:  What do you think is causing the back pain?      They said I have a disc that has slid over. 7. BACK OVERUSE:  Any recent lifting of heavy objects, strenuous work or exercise?     It started on its own. 8. MEDICINES: What have you taken so far for the pain? (e.g., nothing, acetaminophen , NSAIDS)     Pain med and prednisone  given to me in the ED 9. NEUROLOGIC SYMPTOMS: Do you have any weakness, numbness, or problems with bowel/bladder control?     Not asked since seen in ED 10. OTHER SYMPTOMS: Do you have any other symptoms? (e.g., fever, abdomen pain, burning with urination, blood in urine)       No 11. PREGNANCY: Is there any chance you are pregnant? When was your last menstrual period?       N/A due to age  Protocols used: Back Pain-A-AH FYI Only or  Action Required?: Action required by provider: request for appointment.  Patient was last seen in primary care on 04/28/2024 by Gayle Saddie FALCON, PA-C.  Called Nurse Triage reporting Back Pain. Needs ED follow up appt.   Seen 05/27/2024 told she has a slipped disc in lumbar area.  Symptoms began several days ago.  Interventions attempted: Prescription medications: given pain medication and steroids in ED   Feeling better but still having lower back pain.  Symptoms are: gradually improving.  Triage Disposition: See PCP When Office is Open (Within 3 Days) No appts available until Oct 22,    Needs hospital follow up appt.    Patient/caregiver understands and will follow disposition?: Yes

## 2024-05-31 ENCOUNTER — Ambulatory Visit (INDEPENDENT_AMBULATORY_CARE_PROVIDER_SITE_OTHER)

## 2024-05-31 VITALS — BP 124/71 | HR 63 | Temp 97.4°F | Ht 67.0 in | Wt 220.0 lb

## 2024-05-31 DIAGNOSIS — Z6831 Body mass index (BMI) 31.0-31.9, adult: Secondary | ICD-10-CM | POA: Diagnosis not present

## 2024-05-31 DIAGNOSIS — M545 Low back pain, unspecified: Secondary | ICD-10-CM

## 2024-05-31 DIAGNOSIS — F411 Generalized anxiety disorder: Secondary | ICD-10-CM

## 2024-05-31 MED ORDER — WEGOVY 0.5 MG/0.5ML ~~LOC~~ SOAJ: 0.5000 mg | SUBCUTANEOUS | 2 refills | Status: DC

## 2024-05-31 MED ORDER — MELOXICAM 15 MG PO TABS: 15.0000 mg | ORAL_TABLET | Freq: Every day | ORAL | 2 refills | Status: DC

## 2024-05-31 MED ORDER — CYCLOBENZAPRINE HCL 10 MG PO TABS: 10.0000 mg | ORAL_TABLET | Freq: Three times a day (TID) | ORAL | 0 refills | Status: DC | PRN

## 2024-05-31 NOTE — Assessment & Plan Note (Signed)
 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 Right-sided pain likely from vertebral misalignment. No neurosurgical intervention needed unless symptoms worsen. Prefers to avoid surgery unless necessary. - Continue hydrocodone  5/325 as prescribed by the ED  as needed for severe pain, use sparingly. - Finish current course of prednisone  (one day left) - Start meloxicam  15 mg after completing prednisone  for ongoing anti-inflammatory management. - Prescribe Flexeril  (cyclobenzaprine ) for muscle relaxation, to be taken at night as needed. - Encourage continued movement as tolerated, avoid prolonged rest. - Consider referral to neurosurgery if symptoms do not improve or if flares become more frequent.

## 2024-05-31 NOTE — Patient Instructions (Signed)
 VISIT SUMMARY: During your visit, we discussed your recent back pain and the results of your ER visit. We also reviewed your current weight management therapy and made adjustments to your treatment plan.  YOUR PLAN: -LUMBAR SPONDYLOSIS WITH RETROLISTHESIS AND RIGHT-SIDED PAIN: You have degenerative changes in your lower spine with a slight misalignment of the vertebrae at L3 and L4, causing pain on your right side. We will continue your current pain management with hydrocodone  as needed and finish your prednisone  course. After that, you will start meloxicam  for ongoing inflammation. We have also prescribed Flexeril  to help with muscle relaxation at night. It's important to keep moving as much as you can and avoid prolonged rest. If your symptoms do not improve or worsen, we may consider a referral to neurosurgery.  -WEIGHT LOSS: You are currently on Wegovy  for weight management and have experienced minimal weight loss. We will increase your dose to 0.5 mg and monitor for any side effects, especially nausea. We will continue to assess your appetite suppression and weight loss, and adjust the dosage as needed based on your response and tolerance.  INSTRUCTIONS: Please continue taking hydrocodone  as needed for severe pain and finish your current course of prednisone . After completing prednisone , start taking meloxicam  for ongoing inflammation. Take Flexeril  at night as needed for muscle relaxation. Increase your Wegovy  dose to 0.5 mg and monitor for any side effects. If your back pain does not improve or worsens, we may need to consider a referral to neurosurgery.  If you have any problems before your next visit feel free to message me via MyChart (minor issues or questions) or call the office, otherwise you may reach out to schedule an office visit.  Thank you! Saddie Sacks, PA-C

## 2024-05-31 NOTE — Progress Notes (Signed)
 Established Patient Office Visit  Subjective   Patient ID: Christina [name], female    DOB: 11-Jan-1966  Age: 59 y.o. MRN: [id number]  Chief Complaint  Patient presents with   Hospitalization Follow-up    HPI  History of Present Illness   Christina [name] is a 58 year old female who presents with back pain following a recent ER visit.  Lumbar back pain - Severe lumbar back pain onset prior to September 19th, prompting ER visit on September 19th - Pain localized to the back, without radiation, numbness, tingling, or sciatic symptoms - Pain intensity was severe enough to prevent standing upright and required leaving work early - Occupational duties at a [employer] involving lifting and bending exacerbate symptoms; pain remains challenging to manage at work despite some improvement since Friday - Spinal x-ray in ER showed degenerative changes and retrolisthesis at L3 and L4 - No current use of muscle relaxants; Robaxin  previously ineffective, open to trial of Flexeril   Analgesic and anti-inflammatory medication use - Prescribed hydrocodone  5/325 mg tablets in the ER; has taken only three tablets due to concerns about addiction - Currently taking prednisone  with one or two days remaining in the course  Weight management therapy - Currently on Wegovy  for weight management - Ready to increase dose from the lowest dose to 0.5 mg due to minimal weight loss - Mild nausea as a side effect, manageable - Weight loss of four pounds since last month          ROS Per HPI.    Objective:     BP 124/71   Pulse 63   Temp (!) 97.4 F (36.3 C) (Oral)   Ht 5' 7 (1.702 m)   Wt 220 lb (99.8 kg)   LMP  (LMP Unknown)   SpO2 98%   BMI 34.46 kg/m    Physical Exam Constitutional:      General: She is not in acute distress.    Appearance: Normal appearance.  Cardiovascular:     Rate and Rhythm: Normal rate and regular rhythm.     Heart sounds: Normal heart sounds. No murmur heard.    No  friction rub. No gallop.  Pulmonary:     Effort: Pulmonary effort is normal. No respiratory distress.     Breath sounds: Normal breath sounds.  Musculoskeletal:        General: No swelling.     Lumbar back: Spasms and tenderness present. Decreased range of motion.     Comments: + muscle tightness of lower right sided paraspinal muscles   Skin:    General: Skin is warm and dry.  Neurological:     General: No focal deficit present.     Mental Status: She is alert.  Psychiatric:        Mood and Affect: Mood normal.        Behavior: Behavior normal.        Thought Content: Thought content normal.      No results found for any visits on 05/31/24.    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 Right-sided pain likely from vertebral misalignment. No neurosurgical intervention needed unless symptoms worsen. Prefers to avoid surgery unless necessary. - Continue hydrocodone  5/325 as prescribed by the ED  as needed for severe pain, use sparingly. - Finish current course of prednisone  (one day left) - Start meloxicam  15 mg after completing prednisone  for ongoing anti-inflammatory management. - Prescribe Flexeril  (cyclobenzaprine ) for muscle relaxation, to be taken at night as needed. - Encourage continued movement as tolerated, avoid prolonged rest. - Consider referral to neurosurgery if symptoms do not improve or if flares become more frequent.   BMI 31.0-31.9,adult Assessment & Plan: Patient currently on Wegovy  0.25 mg. Has completed her first month of this and ready to go up on dose. Will send in 0.5 mg weekly dose. -4 lbs since starting   GAD (generalized anxiety disorder) Assessment & Plan: Improved and doing  well on the Pristiq  50 mg daily and hydroxyzine  as needed. PHQ9 and GAD7 scores have improved from previous. If needed, can go up to 75 mg on the Pristiq  but will hold off for now. Will cont to monitor.   Other orders -     Wegovy ; Inject 0.5 mg into the skin once a week.  Dispense: 2 mL; Refill: 2 -     Meloxicam ; Take 1 tablet (15 mg total) by mouth daily.  Dispense: 30 tablet; Refill: 2 -     Cyclobenzaprine  HCl; Take 1 tablet (10 mg total) by mouth 3 (three) times daily as needed for muscle spasms.  Dispense: 30 tablet; Refill: 0    Return if symptoms worsen or fail to improve.    Saddie JULIANNA Sacks, PA-C

## 2024-05-31 NOTE — Assessment & Plan Note (Signed)
 Patient currently on Wegovy  0.25 mg. Has completed her first month of this and ready to go up on dose. Will send in 0.5 mg weekly dose. -4 lbs since starting

## 2024-05-31 NOTE — Assessment & Plan Note (Addendum)
 Improved and doing well on the Pristiq  50 mg daily and hydroxyzine  as needed. PHQ9 and GAD7 scores have improved from previous. If needed, can go up to 75 mg on the Pristiq  but will hold off for now. Will cont to monitor.

## 2024-06-02 ENCOUNTER — Encounter: Admitting: Orthopaedic Surgery

## 2024-06-03 ENCOUNTER — Inpatient Hospital Stay: Admitting: Family Medicine

## 2024-06-06 ENCOUNTER — Other Ambulatory Visit: Payer: Self-pay

## 2024-06-06 MED ORDER — WEGOVY 1 MG/0.5ML ~~LOC~~ SOAJ: 1.0000 mg | SUBCUTANEOUS | 0 refills | Status: DC

## 2024-06-14 ENCOUNTER — Encounter: Attending: Physical Medicine & Rehabilitation | Admitting: Physical Medicine & Rehabilitation

## 2024-06-14 ENCOUNTER — Encounter: Payer: Self-pay | Admitting: Physical Medicine & Rehabilitation

## 2024-06-14 VITALS — BP 126/77 | HR 77 | Ht 67.0 in | Wt 216.0 lb

## 2024-06-14 DIAGNOSIS — R200 Anesthesia of skin: Secondary | ICD-10-CM | POA: Diagnosis not present

## 2024-06-14 NOTE — Progress Notes (Signed)
 EMG/NCV of both lower extremities performed at the request of primary care provider Saddie Sacks.  This was to evaluate lower extremity numbness and burning pain.  The EMG report is filed under chart review, media tab.  May not be scanned for up to 1 week post procedure.

## 2024-06-20 ENCOUNTER — Ambulatory Visit: Admitting: Family Medicine

## 2024-06-23 ENCOUNTER — Ambulatory Visit

## 2024-06-23 VITALS — BP 109/71 | HR 67 | Temp 97.6°F | Ht 67.0 in | Wt 210.1 lb

## 2024-06-23 DIAGNOSIS — Z7984 Long term (current) use of oral hypoglycemic drugs: Secondary | ICD-10-CM | POA: Diagnosis not present

## 2024-06-23 DIAGNOSIS — R197 Diarrhea, unspecified: Secondary | ICD-10-CM | POA: Diagnosis not present

## 2024-06-23 DIAGNOSIS — Z0000 Encounter for general adult medical examination without abnormal findings: Secondary | ICD-10-CM

## 2024-06-23 DIAGNOSIS — F321 Major depressive disorder, single episode, moderate: Secondary | ICD-10-CM | POA: Diagnosis not present

## 2024-06-23 DIAGNOSIS — Z6831 Body mass index (BMI) 31.0-31.9, adult: Secondary | ICD-10-CM | POA: Diagnosis not present

## 2024-06-23 DIAGNOSIS — E119 Type 2 diabetes mellitus without complications: Secondary | ICD-10-CM | POA: Diagnosis not present

## 2024-06-23 DIAGNOSIS — M545 Low back pain, unspecified: Secondary | ICD-10-CM | POA: Diagnosis not present

## 2024-06-23 DIAGNOSIS — Z01818 Encounter for other preprocedural examination: Secondary | ICD-10-CM

## 2024-06-23 DIAGNOSIS — R200 Anesthesia of skin: Secondary | ICD-10-CM | POA: Diagnosis not present

## 2024-06-23 MED ORDER — IBUPROFEN 600 MG PO TABS: 600.0000 mg | ORAL_TABLET | Freq: Four times a day (QID) | ORAL | 0 refills | Status: DC | PRN

## 2024-06-23 NOTE — Patient Instructions (Signed)
 VISIT SUMMARY: Today, we addressed your ongoing issues with diabetes, sleep apnea, diarrhea, and other health concerns. We discussed your current medications, potential changes, and strategies to improve your overall health and well-being.  YOUR PLAN: TYPE 2 DIABETES MELLITUS: Your diabetes is well-controlled with Wegovy , but your A1c is slightly above the target. You are not taking metformin  regularly, which could help improve your control. -Please resume taking metformin  as prescribed. -We will monitor your A1c levels in early December. -If Wegovy  is not covered by your insurance, we may consider switching to Ozempic .  OBESITY: You have lost a significant amount of weight since August with the help of Wegovy , which is positively affecting your health. -Continue taking Wegovy . -We will monitor your weight and adjust the treatment as needed.  DIARRHEA: You have been experiencing diarrhea for the past four days, which may be related to your Wegovy  injection or a viral infection. -Consider switching the injection site to your thigh. -Use Imodium as needed, but avoid daily use.  OBSTRUCTIVE SLEEP APNEA: You are having difficulty with your CPAP machine due to issues with the mask and cords, and you are interested in the Inspire device. -Contact your CPAP supplier to explore alternative masks or cordless options. -We will consider alternative treatments if your compliance does not improve.  PERIPHERAL NEUROPATHY (SUSPECTED): You have symptoms consistent with neuropathy, and we are waiting for the results of your recent nerve conduction study. -We will follow up on the results of your nerve conduction study. -Consider taking gabapentin  for neuropathy and sleep issues after we receive the study results.  DEPRESSION AND ANXIETY: Your anxiety has improved, but you are not taking Pristiq  regularly. Screening suggests reduced anxiety. -We should discuss resuming Pristiq , starting with a lower  dose.  CHRONIC BACK PAIN: You find ibuprofen  more effective than meloxicam  for your back pain. -Discontinue meloxicam . -Refill ibuprofen  600 mg as needed.  If you have any problems before your next visit feel free to message me via MyChart (minor issues or questions) or call the office, otherwise you may reach out to schedule an office visit.  Thank you! Saddie Sacks, PA-C

## 2024-06-24 ENCOUNTER — Other Ambulatory Visit: Payer: Self-pay

## 2024-06-24 DIAGNOSIS — Z0000 Encounter for general adult medical examination without abnormal findings: Secondary | ICD-10-CM | POA: Insufficient documentation

## 2024-06-24 DIAGNOSIS — R197 Diarrhea, unspecified: Secondary | ICD-10-CM | POA: Insufficient documentation

## 2024-06-24 NOTE — Assessment & Plan Note (Signed)
 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 Right-sided pain likely from vertebral misalignment. No neurosurgical intervention needed unless symptoms worsen. Prefers to avoid surgery unless necessary. -Ibuprofen  more effective than meloxicam  for back pain. - Discontinue meloxicam . - Refill ibuprofen  600 mg as needed.  - Consider referral to neurosurgery if symptoms do not improve or if flares become more frequent.

## 2024-06-24 NOTE — Assessment & Plan Note (Signed)
 Diarrhea for four days post-Wegovy  injection, possibly medication-related or viral. - Consider switching injection site to thigh. - Use Imodium as needed, avoid daily use.

## 2024-06-24 NOTE — Assessment & Plan Note (Signed)
 Went over labs in detail. Declined flu vaccine today. Colonoscopy and other age-appropriate health screenings up-to-date.

## 2024-06-24 NOTE — Assessment & Plan Note (Signed)
 Patient down 14 lbs since January 2025. Currently on Wegovy  0.5 mg. Insurance likely to stop coverage when she is due for her next refill. Will plan to swap for Ozempic  given concurrent diagnosis of type 2 diabetes.

## 2024-06-24 NOTE — Assessment & Plan Note (Signed)
 Symptoms consistent with neuropathy, nerve conduction study results pending. - Follow up on nerve conduction study results. - Consider gabapentin  for neuropathy and sleep issues post-results.

## 2024-06-24 NOTE — Assessment & Plan Note (Signed)
 Improvement in anxiety, not taking Pristiq  regularly, screening suggests reduced anxiety. - Discuss resuming Pristiq  at 25 mg, starting with lower dose, with titration up to 50 mg if needed. - Hydroxyzine  effective when needed Will cont to monitor.

## 2024-06-24 NOTE — Progress Notes (Signed)
 Complete physical exam  Patient: Christina [name]   DOB: [birth year]   58 y.o. Female  MRN: [id number]  Subjective:    Chief Complaint  Patient presents with   Annual Exam    Physical     History of Present Illness   Christina [name] is a 58 year old female who presents for CPE  Diarrhea and gastrointestinal symptoms - Diarrhea for the past four days, onset after most recent Wegovy  injection - Currently on third week of Wegovy  0.5 mg dose; no prior episodes of diarrhea with previous doses - Significant weight loss over the last two days, suspected to be related to diarrhea - No blood in stool - Grandson also experiencing stomach pain and diarrhea this week so potential for it to be infectious in etiology  Sleep apnea and cpap intolerance - Difficulty tolerating CPAP machine since August, initially managed well but now intolerable due to getting tangled up in the cords - Disrupted sleep due to issues with CPAP cords and mask - Inquiring about cordless options for sleep apnea treatment   Diabetes mellitus and medication adherence - Not taking metformin  regularly. Reports she has been forgetting to take it  - Blood sugar levels around 90, attributed to Wegovy  - History of A1c 7.1, goal less than 7% - Difficulty remembering to take her daily medications due to responsibilities with four children at home  Hypertension and medication adherence - Not taking blood pressure medication regularly - Blood pressure readings have been good and likely due to recent weight loss   Sleep disturbance and restless legs - Frequent nighttime awakenings - Restless leg symptoms  Peripheral neuropathy evaluation - Recent nerve conduction study performed for neuropathy evaluation - Results of the study have not yet been communicated in her chart    Most recent fall risk assessment:    06/23/2024    1:50 PM  Fall Risk   Falls in the past year? 0  Follow up Falls evaluation completed      Most recent depression screenings:    06/23/2024    1:51 PM 06/14/2024    3:16 PM  PHQ 2/9 Scores  PHQ - 2 Score 4 4  PHQ- 9 Score 11         Patient Care Team: Gayle Saddie JULIANNA DEVONNA as PCP - General (Physician Assistant) Cleotilde Ronal RAMAN, MD as Consulting Physician (Gynecology) Moses Powell Hummer, NP as Nurse Practitioner (Hematology and Oncology)   Outpatient Medications Prior to Visit  Medication Sig   cromolyn (OPTICROM) 4 % ophthalmic solution SMARTSIG:1 Drop(s) In Eye(s) Twice Daily PRN   cyclobenzaprine  (FLEXERIL ) 10 MG tablet Take 1 tablet (10 mg total) by mouth 3 (three) times daily as needed for muscle spasms.   desvenlafaxine  (PRISTIQ ) 50 MG 24 hr tablet Take 1 tablet (50 mg total) by mouth daily.   hydrOXYzine  (VISTARIL ) 100 MG capsule Take 1 capsule (100 mg total) by mouth 3 (three) times daily as needed for itching.   metFORMIN  (GLUCOPHAGE ) 500 MG tablet Take 1 tablet (500 mg total) by mouth daily with breakfast.   semaglutide -weight management (WEGOVY ) 1 MG/0.5ML SOAJ SQ injection Inject 1 mg into the skin once a week.   [DISCONTINUED] hydrochlorothiazide  (HYDRODIURIL ) 12.5 MG tablet Take 1 tablet (12.5 mg total) by mouth daily.   [DISCONTINUED] ibuprofen  (ADVIL ) 600 MG tablet Take 1 tablet (600 mg total) by mouth every 6 (six) hours as needed.   [DISCONTINUED] meloxicam  (MOBIC ) 15 MG tablet Take 1 tablet (15 mg total)  by mouth daily.   [DISCONTINUED] predniSONE  (DELTASONE ) 10 MG tablet 6,5,4,3,2,1 taper   No facility-administered medications prior to visit.    ROS   Per HPI     Objective:     BP 109/71   Pulse 67   Temp 97.6 F (36.4 C) (Oral)   Ht 5' 7 (1.702 m)   Wt 210 lb 1.3 oz (95.3 kg)   LMP  (LMP Unknown)   SpO2 98%   BMI 32.90 kg/m    Physical Exam Constitutional:      General: She is not in acute distress.    Appearance: Normal appearance.  Cardiovascular:     Rate and Rhythm: Normal rate and regular rhythm.     Heart sounds:  Normal heart sounds. No murmur heard.    No friction rub. No gallop.  Pulmonary:     Effort: Pulmonary effort is normal. No respiratory distress.     Breath sounds: Normal breath sounds.  Abdominal:     General: Abdomen is flat. Bowel sounds are normal.     Palpations: Abdomen is soft.  Musculoskeletal:        General: No swelling.     Cervical back: Neck supple.  Lymphadenopathy:     Cervical: No cervical adenopathy.  Skin:    General: Skin is warm and dry.  Neurological:     General: No focal deficit present.     Mental Status: She is alert.  Psychiatric:        Mood and Affect: Mood normal.        Behavior: Behavior normal.        Thought Content: Thought content normal.      No results found for any visits on 06/23/24. Last CBC Lab Results  Component Value Date   WBC 7.3 05/10/2024   HGB 14.1 05/10/2024   HCT 44.5 05/10/2024   MCV 85 05/10/2024   MCH 27.0 05/10/2024   RDW 13.4 05/10/2024   PLT 341 05/10/2024   Last metabolic panel Lab Results  Component Value Date   GLUCOSE 118 (H) 05/10/2024   NA 141 05/10/2024   K 4.9 05/10/2024   CL 105 05/10/2024   CO2 21 05/10/2024   BUN 15 05/10/2024   CREATININE 0.60 05/10/2024   EGFR 105 05/10/2024   CALCIUM 10.3 (H) 05/10/2024   PROT 7.3 05/10/2024   ALBUMIN 4.4 05/10/2024   LABGLOB 2.9 05/10/2024   AGRATIO 1.3 03/30/2023   BILITOT 0.5 05/10/2024   ALKPHOS 88 05/10/2024   AST 63 (H) 05/10/2024   ALT 90 (H) 05/10/2024   ANIONGAP 5 04/19/2024   Last lipids Lab Results  Component Value Date   CHOL 133 05/10/2024   HDL 32 (L) 05/10/2024   LDLCALC 82 05/10/2024   TRIG 99 05/10/2024   CHOLHDL 4.2 05/10/2024   Last hemoglobin A1c Lab Results  Component Value Date   HGBA1C 7.1 (H) 05/10/2024   Last thyroid  functions Lab Results  Component Value Date   TSH 2.860 05/10/2024   T3TOTAL 146 09/16/2021   Last vitamin D  Lab Results  Component Value Date   VD25OH 21.4 (L) 05/10/2024        Assessment  & Plan:    Routine Health Maintenance and Physical Exam  Health Maintenance  Topic Date Due   Pneumococcal Vaccine for age over 39 (1 of 2 - PCV) Never done   Hepatitis B Vaccine (1 of 3 - 19+ 3-dose series) Never done   COVID-19 Vaccine (3 - Pfizer risk  series) 01/04/2020   Flu Shot  04/08/2024   Hemoglobin A1C  11/07/2024   Eye exam for diabetics  12/15/2024   Yearly kidney health urinalysis for diabetes  02/14/2025   Complete foot exam   02/14/2025   Yearly kidney function blood test for diabetes  05/10/2025   Colon Cancer Screening  04/05/2029   DTaP/Tdap/Td vaccine (3 - Td or Tdap) 05/11/2029   Hepatitis C Screening  Completed   HIV Screening  Completed   Zoster (Shingles) Vaccine  Completed   HPV Vaccine  Aged Out   Meningitis B Vaccine  Aged Out   Breast Cancer Screening  Discontinued    Discussed health benefits of physical activity, and encouraged her to engage in regular exercise appropriate for her age and condition.  Type 2 diabetes mellitus without complication, without long-term current use of insulin  (HCC) Assessment & Plan: A1c goal <7. UTD on diabetic eye exam. Most recent A1c increased from 7.0 to 7.1.  -Discussed importance of compliance to Metformin  for tighter A1c control. She is also on Wegovy  0.5 mg weekly. Insurance likely to stop covering when she is due for her next refill. Will likely need to switch to Ozempic  by then.  Foot exam and UACR performed today.    Preop examination -     Ibuprofen ; Take 1 tablet (600 mg total) by mouth every 6 (six) hours as needed.  Dispense: 30 tablet; Refill: 0  BMI 31.0-31.9,adult Assessment & Plan: Patient down 14 lbs since January 2025. Currently on Wegovy  0.5 mg. Insurance likely to stop coverage when she is due for her next refill. Will plan to swap for Ozempic  given concurrent diagnosis of type 2 diabetes.   Diarrhea, unspecified type Assessment & Plan: Diarrhea for four days post-Wegovy  injection, possibly  medication-related or viral. - Consider switching injection site to thigh. - Use Imodium as needed, avoid daily use.   Numbness of toes Assessment & Plan: Symptoms consistent with neuropathy, nerve conduction study results pending. - Follow up on nerve conduction study results. - Consider gabapentin  for neuropathy and sleep issues post-results.   MDD (major depressive disorder), single episode, moderate (HCC) Assessment & Plan: Improvement in anxiety, not taking Pristiq  regularly, screening suggests reduced anxiety. - Discuss resuming Pristiq  at 25 mg, starting with lower dose, with titration up to 50 mg if needed. - Hydroxyzine  effective when needed Will cont to monitor.   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 Right-sided pain likely from vertebral misalignment. No neurosurgical intervention needed unless symptoms worsen. Prefers to avoid surgery unless necessary. -Ibuprofen  more effective than meloxicam  for back pain. - Discontinue meloxicam . - Refill ibuprofen  600 mg as needed.  - Consider referral to neurosurgery if symptoms do not improve or if flares become more frequent.    Healthcare maintenance Assessment & Plan: Santina over labs in detail. Declined flu vaccine today. Colonoscopy and other age-appropriate health screenings up-to-date.          Return in about 3 months (around 09/23/2024) for DM, Weight, Mood.     Saddie JULIANNA Sacks, PA-C

## 2024-06-24 NOTE — Assessment & Plan Note (Signed)
 A1c goal <7. UTD on diabetic eye exam. Most recent A1c increased from 7.0 to 7.1.  -Discussed importance of compliance to Metformin  for tighter A1c control. She is also on Wegovy  0.5 mg weekly. Insurance likely to stop covering when she is due for her next refill. Will likely need to switch to Ozempic  by then.  Foot exam and UACR performed today.

## 2024-06-28 ENCOUNTER — Other Ambulatory Visit: Payer: Self-pay

## 2024-06-28 ENCOUNTER — Encounter: Payer: Self-pay | Admitting: Physical Medicine & Rehabilitation

## 2024-06-28 DIAGNOSIS — E119 Type 2 diabetes mellitus without complications: Secondary | ICD-10-CM

## 2024-06-28 MED ORDER — TIRZEPATIDE 2.5 MG/0.5ML ~~LOC~~ SOAJ: 2.5000 mg | SUBCUTANEOUS | 0 refills | Status: DC

## 2024-06-29 ENCOUNTER — Other Ambulatory Visit: Payer: Self-pay

## 2024-06-29 MED ORDER — GABAPENTIN 100 MG PO CAPS: 100.0000 mg | ORAL_CAPSULE | Freq: Every day | ORAL | 3 refills | Status: DC

## 2024-06-30 ENCOUNTER — Encounter: Payer: Self-pay | Admitting: Family Medicine

## 2024-06-30 ENCOUNTER — Ambulatory Visit: Admitting: Family Medicine

## 2024-06-30 ENCOUNTER — Other Ambulatory Visit: Payer: Self-pay

## 2024-06-30 VITALS — BP 137/94 | Ht 67.0 in | Wt 210.0 lb

## 2024-06-30 DIAGNOSIS — M25462 Effusion, left knee: Secondary | ICD-10-CM

## 2024-06-30 DIAGNOSIS — M25562 Pain in left knee: Secondary | ICD-10-CM | POA: Diagnosis not present

## 2024-06-30 DIAGNOSIS — G8929 Other chronic pain: Secondary | ICD-10-CM

## 2024-06-30 DIAGNOSIS — E119 Type 2 diabetes mellitus without complications: Secondary | ICD-10-CM

## 2024-06-30 MED ORDER — METHYLPREDNISOLONE ACETATE 40 MG/ML IJ SUSP
40.0000 mg | Freq: Once | INTRAMUSCULAR | Status: AC
  Administered 2024-06-30: 40 mg via INTRA_ARTICULAR

## 2024-06-30 NOTE — Progress Notes (Signed)
 DATE OF VISIT: 06/30/2024        Christina [name] DOB: 08-21-1966 MRN: [id number]  Discussed the use of AI scribe software for clinical note transcription with the patient, who gave verbal consent to proceed.  History of Present Illness Christina [name] is a 58 year old female with a history of right total knee arthroplasty who presents with left knee pain.  Left knee pain - Escalating pain for one week, described as acute on chronic - Dull, achy pain localized to the anterior knee - Worsened by prolonged standing at work in a [employer] - Alleviated by rest - No use of analgesics such as Tylenol  or ibuprofen  - No engagement in physical therapy - Pain does not wake her from sleep  Mechanical symptoms and swelling of left knee - Crackling and popping sounds with movement - Pain during leg extension - Increased swelling of the knee - No history of gout or pseudogout - No fevers, chills, night sweats, or new rashes - No new sexual partners  Prior interventions and imaging - Previous corticosteroid injections in the knees provided modest but short-lived relief - No recent imaging of the left knee  Associated musculoskeletal symptoms - No pain in the hips or contralateral (right) knee  Relevant medical history - Diabetes, well controlled, last A1c= 7.1 on 05/10/24 - Smoker - History of right total knee arthroplasty -symptoms left knee feels very similar to her prior symptoms in the right    Medications:  Outpatient Encounter Medications as of 06/30/2024  Medication Sig   cromolyn (OPTICROM) 4 % ophthalmic solution SMARTSIG:1 Drop(s) In Eye(s) Twice Daily PRN   cyclobenzaprine  (FLEXERIL ) 10 MG tablet Take 1 tablet (10 mg total) by mouth 3 (three) times daily as needed for muscle spasms.   desvenlafaxine  (PRISTIQ ) 50 MG 24 hr tablet Take 1 tablet (50 mg total) by mouth daily.   gabapentin  (NEURONTIN ) 100 MG capsule Take 1 capsule (100 mg total) by mouth at bedtime.   hydrOXYzine   (VISTARIL ) 100 MG capsule Take 1 capsule (100 mg total) by mouth 3 (three) times daily as needed for itching.   ibuprofen  (ADVIL ) 600 MG tablet Take 1 tablet (600 mg total) by mouth every 6 (six) hours as needed.   metFORMIN  (GLUCOPHAGE ) 500 MG tablet Take 1 tablet (500 mg total) by mouth daily with breakfast.   Semaglutide ,0.25 or 0.5MG /DOS, (OZEMPIC , 0.25 OR 0.5 MG/DOSE,) 2 MG/3ML SOPN Inject 0.25 mg into the skin once a week.   [EXPIRED] methylPREDNISolone  acetate (DEPO-MEDROL ) injection 40 mg    No facility-administered encounter medications on file as of 06/30/2024.    Allergies: has no known allergies.  Physical Examination: Vitals: BP (!) 137/94   Ht 5' 7 (1.702 m)   Wt 210 lb (95.3 kg)   LMP  (LMP Unknown)   BMI 32.89 kg/m  GENERAL:  [name] is a 58 y.o. female appearing their stated age, alert and oriented x 3, in no apparent distress.  SKIN: no rashes or lesions, skin clean, dry, intact MSK: Knee: Left knee with small to moderate effusion.  No increased redness or warmth.  Near full range of motion with pain at terminal flexion.  Tender palpation along the medial joint line, no lateral joint line tenderness.  No tenderness over the patella, patellar tendon, quad tendon.  Positive McMurray with associated pain, no palpable click.  Negative Lachman, negative varus and valgus stress. Right knee with well-healed anterior surgical scar from prior knee replacement.  Full range of motion  without pain, weakness, instability Walking with a slight limp NEURO: sensation intact to light touch VASC: pulses 2+ and symmetric DP/PT bilaterally, no edema  Assessment & Plan Acute on chronic flare of chronic left knee pain likely due to underlying osteoarthritis, worsening symptoms over the last week left knee, escalating pain, mechanical symptoms, and swelling.  Differential includes arthritis flare, meniscus tear   -Diagnosis and treatment discussed.  She would be candidate for  cortisone injection as well as aspiration to help with her symptoms.  She does have underlying diabetes, with this is currently well-controlled.  Reviewed last A1c as noted in the HPI.  She would like to proceed with injection.  Ultrasound-guided aspiration and injection completed as noted below - Recommend use of a knee sleeve for support and stability.  She will plan to purchase over-the-counter - Would benefit from physical therapy.  Discussed formal PT versus home exercise program.  She prefers home exercises, HEP was given - Can use over-the-counter NSAIDs or Voltaren gel as needed - Will obtain x-rays of the left knee to assess underlying degree of osteoarthritis.  I will reach out with results once available - Follow-up 6 to 8 weeks to reevaluate, sooner as needed  PROCEDURE:  Risks & benefits of LT knee ultrasound-guided aspiration & cortisone injection reviewed. Consent obtained. Time-out completed. Patient prepped and draped in the normal fashion. Ultrasound used to identify suprapatellar pouch.  Noted to have moderate effusion.  Area cleansed with chlorhexidene.  Ethyl chloride spray used to anesthetize the skin. Solution of 3 mL 1% lidocaine  injected into superior lateral aspect of left knee for local anesthesia.  After ensuring adequate anesthesia an 18-g 1.5-inch needle on 60-mL syringe inserted into left knee via superior-lateral approach.  Aspirated 20 cc of yellow synovial fluid - was not sent for analysis.  Needle left in place, syringe exchanged for syringe with solution of 4 mL 1% lidocaine  with 1 mL methylprednisolone  (Depo-medrol ) 40mg /mL.  This was injected into the left knee without complication. Patient tolerated procedure well without any complications. Area covered with adhesive bandage. Post-procedure care reviewed.  All questions answered. - Images of ultrasound not saved, ultrasound used for academic purposes only today   Type 2 diabetes mellitus, well controlled -A1c 7.1 on  05/10/2024.  Discussed potential risks of cortisone injection, expect should not affect her blood sugars.  She would like to proceed    Patient expressed understanding & agreement with above.  Encounter Diagnoses  Name Primary?   Chronic pain of left knee Yes   Knee effusion, left    Type 2 diabetes mellitus without complication, without long-term current use of insulin  (HCC)     Orders Placed This Encounter  Procedures   DG Knee 4 Views W/Patella Left     VISIT SUMMARY: You came in today due to escalating pain in your left knee, which has been worsening over the past week. You described the pain as dull and achy, and it is aggravated by prolonged standing at work. You also reported mechanical symptoms like crackling and popping sounds, as well as increased swelling in the knee. We discussed your history of diabetes and smoking, and reviewed your previous treatments and imaging results.  YOUR PLAN: -LEFT KNEE OSTEOARTHRITIS WITH ACUTE ON CHRONIC FLARE: You have osteoarthritis in your left knee, which means the cartilage in your knee is wearing down, causing pain and swelling. To manage this, I am prescribing a two-week course of meloxicam , an anti-inflammatory medication. We may also consider a corticosteroid  injection since your diabetes is well controlled. I recommend starting physical therapy to strengthen your quadriceps and prevent further injury. Additionally, I am ordering X-rays of your left knee to check for any structural changes. Using a compression knee sleeve can provide extra support.  -TYPE 2 DIABETES MELLITUS, WELL CONTROLLED: Your type 2 diabetes is well controlled, which means your blood sugar levels are within a target range. Keep up the good work with your current management plan.  -NICOTINE DEPENDENCE: You have nicotine dependence, which means you are addicted to tobacco. Quitting smoking is highly recommended for your overall health, especially to improve your knee  condition and diabetes management.  INSTRUCTIONS: Please follow up with the recommended X-rays of your left knee. Start taking meloxicam  as prescribed and consider scheduling an appointment for a corticosteroid injection if needed. Begin physical therapy sessions to strengthen your knee. Continue managing your diabetes as you have been, and consider seeking help to quit smoking for better overall health. Contains text generated by [employer].

## 2024-06-30 NOTE — Patient Instructions (Signed)

## 2024-07-11 ENCOUNTER — Encounter: Payer: Self-pay | Admitting: Radiology

## 2024-07-15 ENCOUNTER — Telehealth: Payer: Self-pay

## 2024-07-15 NOTE — Telephone Encounter (Signed)
 Patient called the office reporting she feels like her prolapse is coming back. She has been scheduled for 11/11 for appt.

## 2024-07-19 ENCOUNTER — Ambulatory Visit: Admitting: Obstetrics

## 2024-07-20 DIAGNOSIS — Z419 Encounter for procedure for purposes other than remedying health state, unspecified: Secondary | ICD-10-CM | POA: Diagnosis not present

## 2024-07-21 ENCOUNTER — Ambulatory Visit: Admitting: Gastroenterology

## 2024-07-21 NOTE — Progress Notes (Deleted)
 Christina [name]

## 2024-07-22 ENCOUNTER — Telehealth: Payer: Self-pay

## 2024-07-22 DIAGNOSIS — G4733 Obstructive sleep apnea (adult) (pediatric): Secondary | ICD-10-CM | POA: Diagnosis not present

## 2024-07-22 DIAGNOSIS — Z1509 Genetic susceptibility to other malignant neoplasm: Secondary | ICD-10-CM

## 2024-07-22 DIAGNOSIS — Z860100 Personal history of colon polyps, unspecified: Secondary | ICD-10-CM

## 2024-07-22 DIAGNOSIS — Z13818 Encounter for screening for other digestive system disorders: Secondary | ICD-10-CM

## 2024-07-22 NOTE — Telephone Encounter (Signed)
-----   Message from Cathryne PARAS May sent at 07/21/2024  2:15 PM EST ----- Pod B- Do we know what happened with this MRI appeal?  Deanna, NP

## 2024-07-25 ENCOUNTER — Ambulatory Visit
Admission: RE | Admit: 2024-07-25 | Discharge: 2024-07-25 | Disposition: A | Discharge status: Home / Self Care | Source: Ambulatory Visit | Attending: Family Medicine | Admitting: Family Medicine

## 2024-07-25 DIAGNOSIS — M1712 Unilateral primary osteoarthritis, left knee: Secondary | ICD-10-CM | POA: Diagnosis not present

## 2024-07-25 DIAGNOSIS — M25462 Effusion, left knee: Secondary | ICD-10-CM | POA: Diagnosis not present

## 2024-07-25 DIAGNOSIS — G8929 Other chronic pain: Secondary | ICD-10-CM

## 2024-07-26 ENCOUNTER — Other Ambulatory Visit: Payer: Self-pay

## 2024-07-26 ENCOUNTER — Ambulatory Visit: Payer: Self-pay | Admitting: Family Medicine

## 2024-07-26 MED ORDER — MELOXICAM 15 MG PO TABS: 15.0000 mg | ORAL_TABLET | Freq: Every day | ORAL | 1 refills | Status: DC | PRN

## 2024-07-26 NOTE — Progress Notes (Signed)
 Xrays reviewed, show mild knee OA.  Patient with limited improvement with CSI 06/30/24.  Rx Mobic  15mg  daily prn sent to pharmacy.  Consider HA injections.  She will f/u with me in 2-4 weeks.  Staff to call with results.

## 2024-07-27 MED ORDER — DICLOFENAC SODIUM 75 MG PO TBEC: 75.0000 mg | DELAYED_RELEASE_TABLET | Freq: Two times a day (BID) | ORAL | 1 refills | Status: DC | PRN

## 2024-07-28 ENCOUNTER — Ambulatory Visit: Admitting: Family Medicine

## 2024-07-28 ENCOUNTER — Other Ambulatory Visit: Payer: Self-pay

## 2024-07-28 DIAGNOSIS — E119 Type 2 diabetes mellitus without complications: Secondary | ICD-10-CM

## 2024-07-28 NOTE — Telephone Encounter (Signed)
 Letter faxed to [facility] as requested below.

## 2024-08-08 ENCOUNTER — Encounter: Payer: Self-pay | Admitting: Dermatology

## 2024-08-08 ENCOUNTER — Ambulatory Visit: Admitting: Dermatology

## 2024-08-08 DIAGNOSIS — D485 Neoplasm of uncertain behavior of skin: Secondary | ICD-10-CM

## 2024-08-08 DIAGNOSIS — L820 Inflamed seborrheic keratosis: Secondary | ICD-10-CM | POA: Diagnosis not present

## 2024-08-08 NOTE — Patient Instructions (Addendum)

## 2024-08-08 NOTE — Progress Notes (Signed)
   New Patient Visit   History of Present Illness Christina [name] is a 58 year old female who presents with skin lesions for evaluation.  She has two skin lesions that have been present for over a year. Not previously treated.  She has no prior history of skin cancer, but she has a history of breast cancer.  She underwent a knee replacement three years ago and continues to experience issues related to the recovery. She dislikes pain, referencing her knee replacement experience.  Pt has growths on her chest she'd like evaluated. She has no hx of skin cancer  The following portions of the chart were reviewed this encounter and updated as appropriate: medications, allergies, medical history  Review of Systems:  No other skin or systemic complaints except as noted in HPI or Assessment and Plan.  Objective  Well appearing patient in no apparent distress; mood and affect are within normal limits.  A focused examination was performed of the following areas: chest  Relevant exam findings are noted in the Assessment and Plan. mid chest 0.8 mm pink pearly papule  Right Breast 0.5 mm pink pearly papule  Assessment & Plan   NEOPLASM OF UNCERTAIN BEHAVIOR OF SKIN (2) mid chest Skin / nail biopsy Type of biopsy: tangential   Informed consent: discussed and consent obtained   Timeout: patient name, date of birth, surgical site, and procedure verified   Procedure prep:  Patient was prepped and draped in usual sterile fashion Prep type:  Isopropyl alcohol Anesthesia: the lesion was anesthetized in a standard fashion   Anesthetic:  1% lidocaine  w/ epinephrine  1-100,000 buffered w/ 8.4% NaHCO3 Instrument used: DermaBlade   Hemostasis achieved with: aluminum chloride   Outcome: patient tolerated procedure well   Post-procedure details: sterile dressing applied and wound care instructions given   Dressing type: bandage and pressure dressing    Specimen 1 - Surgical pathology Differential  Diagnosis: R/O NMSC  Check Margins: No Right Breast Skin / nail biopsy Type of biopsy: tangential   Informed consent: discussed and consent obtained   Timeout: patient name, date of birth, surgical site, and procedure verified   Procedure prep:  Patient was prepped and draped in usual sterile fashion Prep type:  Isopropyl alcohol Anesthesia: the lesion was anesthetized in a standard fashion   Anesthetic:  1% lidocaine  w/ epinephrine  1-100,000 buffered w/ 8.4% NaHCO3 Instrument used: DermaBlade   Hemostasis achieved with: aluminum chloride   Outcome: patient tolerated procedure with difficulty   Post-procedure details: sterile dressing applied and wound care instructions given   Dressing type: bandage and pressure dressing    Specimen 2 - Surgical pathology Differential Diagnosis: R/O NMSC  Check Margins: No  Return for TBSE with brenda.  I, Darice Smock, CMA, am acting as scribe for RUFUS CHRISTELLA HOLY, MD.   Documentation: I have reviewed the above documentation for accuracy and completeness, and I agree with the above.  RUFUS CHRISTELLA HOLY, MD

## 2024-08-09 ENCOUNTER — Ambulatory Visit: Payer: Self-pay | Admitting: Dermatology

## 2024-08-09 LAB — SURGICAL PATHOLOGY

## 2024-08-09 NOTE — Telephone Encounter (Signed)
 I spoke with [facility] and they have received the letter. They state this doesn't qualify for the MRI to be approved so they are recommending a peer to peer to be done. The rep informed me anyone could call for the peer to peer review.  CB [phone]  Case# [id number]

## 2024-08-10 NOTE — Telephone Encounter (Signed)
 Order has been re-entered. Sula, please see note regarding new auth. Not sure what provider they had listed but it needs to be under Dr. San - this is an urgent request

## 2024-08-10 NOTE — Telephone Encounter (Signed)
 I called [facility] back and for some reason the rep had selected a wrong provider. I fixed the issue providing Dr Rennis information. I apologize for that mistake but Dr San should be able to call now to speak with a provider about this case  Call ref#: [id number]

## 2024-08-15 NOTE — Telephone Encounter (Signed)
 MyChart message sent to patient with approval and made her aware that radiology scheduling will reach out to her to set up appt.   Secure staff message sent to radiology scheduling to contact patient to set up appt.

## 2024-08-18 DIAGNOSIS — E119 Type 2 diabetes mellitus without complications: Secondary | ICD-10-CM

## 2024-08-19 MED ORDER — SEMAGLUTIDE (1 MG/DOSE) 4 MG/3ML ~~LOC~~ SOPN: 1.0000 mg | PEN_INJECTOR | SUBCUTANEOUS | 0 refills | Status: DC

## 2024-08-24 ENCOUNTER — Ambulatory Visit (HOSPITAL_COMMUNITY): Admission: RE | Admit: 2024-08-24 | Discharge: 2024-08-24 | Attending: Gastroenterology

## 2024-08-24 ENCOUNTER — Other Ambulatory Visit: Payer: Self-pay | Admitting: Gastroenterology

## 2024-08-24 DIAGNOSIS — Z13818 Encounter for screening for other digestive system disorders: Secondary | ICD-10-CM | POA: Insufficient documentation

## 2024-08-24 DIAGNOSIS — Z1501 Genetic susceptibility to malignant neoplasm of breast: Secondary | ICD-10-CM

## 2024-08-24 DIAGNOSIS — Z1509 Genetic susceptibility to other malignant neoplasm: Secondary | ICD-10-CM | POA: Insufficient documentation

## 2024-08-24 MED ORDER — GADOBUTROL 1 MMOL/ML IV SOLN
9.0000 mL | Freq: Once | INTRAVENOUS | Status: AC | PRN
  Administered 2024-08-24: 11:00:00 9 mL via INTRAVENOUS

## 2024-09-03 ENCOUNTER — Encounter (HOSPITAL_COMMUNITY): Payer: Self-pay | Admitting: Emergency Medicine

## 2024-09-06 ENCOUNTER — Other Ambulatory Visit: Payer: Self-pay

## 2024-09-06 ENCOUNTER — Other Ambulatory Visit: Payer: Self-pay | Admitting: Family Medicine

## 2024-09-06 ENCOUNTER — Ambulatory Visit: Admitting: Family Medicine

## 2024-09-06 ENCOUNTER — Encounter: Payer: Self-pay | Admitting: Family Medicine

## 2024-09-06 VITALS — BP 132/88 | Ht 67.0 in | Wt 205.0 lb

## 2024-09-06 DIAGNOSIS — G8929 Other chronic pain: Secondary | ICD-10-CM | POA: Diagnosis not present

## 2024-09-06 DIAGNOSIS — M79671 Pain in right foot: Secondary | ICD-10-CM

## 2024-09-06 DIAGNOSIS — M25562 Pain in left knee: Secondary | ICD-10-CM

## 2024-09-06 MED ORDER — KETOROLAC TROMETHAMINE 60 MG/2ML IM SOLN
60.0000 mg | Freq: Once | INTRAMUSCULAR | Status: AC
  Administered 2024-09-06: 60 mg via INTRA_ARTICULAR

## 2024-09-06 NOTE — Patient Instructions (Signed)
 Today you received an injection with a NSAID (aka: nonsteroidal antiinflammatory injection with a medication called Toradol ). This injection is usually done in response to pain and inflammation. There is some numbing medicine (Lidocaine ) in the shot, so the injected area may be numb and feel really good for the next couple of hours. The numbing medicine usually wears off in 2-3 hours, and then your pain level may be back to where it was before the injection until the cortisone starts working.    Things to watch out for that you should contact us  or a health care provider urgently would include: 1. Unusual (as in more than 10%) increase in pain 2. New fever > 101.5 3. New swelling or redness of the injected area. 4. Streaking of red lines around the area injected.  Do not hesitate to call or reach out with any questions or concerns.

## 2024-09-06 NOTE — Progress Notes (Signed)
 " PCP: Gayle Saddie FALCON, PA-C  Subjective:   HPI: Patient is a 58 y.o. female here for follow-up left knee pain as well as follow-up right foot pain.  Patient was seen on 06/30/2024 for left knee pain.  Had aspiration and injection of steroid.  She states that the steroid injection only helped for maybe a week.  She states that most of her pain is present after sitting for prolonged period of time, which she will have medial knee pain.  She states it will pop and click at times.  She has been taking diclofenac  twice daily which has not been helpful.  She denies any new injuries, redness, fevers.  Patient has chronic right foot issue that has been present for more than a year.  She has previously evaluated an ultrasound find some irregularity in her cuboid.  She has a history of diabetes and states this area will burn, tingle and be numb randomly.  She has previously worn a boot which was helpful for her pain.  She denies any new injury to the area.  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,  multifocal IDC, DCIS, ER/PR+,  01-09-2016 s/p bilteral mastectomies w/ left sln bx;  no chemoradiation   Cataracts, bilateral    Depression    Diabetes mellitus without complication (HCC)    type 2   GAD (generalized anxiety disorder)    Gallbladder problem    History of ovarian cyst    Hypertension    IBS (irritable bowel syndrome)    Joint pain    OSA (obstructive sleep apnea) 09/08/2023   PONV (postoperative nausea and vomiting)    does well with scop patch   Retinal detachment    Rheg OS   Right knee meniscal tear    Urgency of urination     Medications Ordered Prior to Encounter[1]  BP 132/88   Ht 5' 7 (1.702 m)   Wt 205 lb (93 kg)   LMP  (LMP Unknown)   BMI 32.11 kg/m        Objective:   Physical Exam:  Gen: NAD, comfortable in exam room Left Knee   Inspection: No erythema, mild effusion, no warmth Palpation: TTP at the medial joint line ROM: Full extension and flexion with some crepitus Special Tests: Positive McMurray's medially, negative valgus and varus testing, negative Lachman's Neuro: Neurovascular intact distally  Right foot Inspection: Right lateral midfoot with some soft tissue edema Palpation: Tender to palpate over the cuboid, no fifth metatarsal tenderness ROM: Full dorsiflexion plantarflexion Special Tests: Negative anterior drawer Neuro: Sensation intact distally  Left knee Limited ultrasound Lateral and medial suprapatellar pouch to show fluid present  Previous MRI from 2024 of the right foot reviewed, as well as ultrasound of the right foot reviewed.  Assessment/Plan:   Christina [name] is a 58 y.o. female who was seen today for the following: 1. Chronic pain of left knee (Primary) - US  LIMITED JOINT SPACE STRUCTURES LOW LEFT; Future - MR Knee Left  Wo Contrast; Future - Patient had limited relief with previous steroid injection - She did have some fluid aspirated today from her knee - We will obtain an MRI as patient does have a positive McMurray's - We will follow-up with her post MRI - She can continue her diclofenac   2. Chronic foot pain,  right - Previous images reviewed which did show cuboid irregularity as well as arthritis - Discussed conservative management patient this time with continuing medication - She still has the boot and could wear this if she feels that it could help with her swelling - We did discuss sports insoles that may be relief to her knee and foot - At this time she cannot afford this but may look into it in the future  Procedure: Aspiration/injection of the left knee Indications: pain 2/2 effusion Patient: Christina [name] August 20, 1966 Procedure date: 09/06/2024  Risks, benefits, and alternatives were discussed with the patient.  Verbal and written, informed consent obtained.   Time-out conducted.  Noted no overlying erythema, induration, or other signs of local infection.  Skin prepped in a sterile fashion using Chloraprep.  Local anesthesia: Topical Ethyl chloride and 3 cc of 1% lidocaine  w/out epi Anesthetic Needle: 25 ga 1 1/2 A wheal was injected in subcutaneous fashion and then a tract was injected using the lateral suprapatellar approach to the joint capsul Aspirate needle: 18 gauge 1 1/2 The same tract was then followed while aspirating until the joint space was reached Aspirate: 5 cc cc of oily yellow fluid Injection: 60 mg of Toradol  Using careful sterile technique the syringe was replaced with the 18ga needle still in place and the joint space was injected.   Sterile 2x2 gauze w/ bandage was placed.   The procedure was completed without difficulty. Patient tolerated well and no immediate complications or after 5 minutes of monitoring.   Advised to monitor for and call if fevers/chills, erythema, warmth, induration, drainage, or persistent bleeding.  Post procedural instructions were discussed and printed material given.   Follow-up/Education:   Return if symptoms worsen or fail to improve.   May return sooner as needed and encouraged to call/e-mail for additional questions or  worsening symptoms in the interim.  Krystal Lowing, DO Sports Medicine Fellow 09/06/2024 9:36 AM     [1]  Current Outpatient Medications on File Prior to Visit  Medication Sig Dispense Refill   cromolyn (OPTICROM) 4 % ophthalmic solution SMARTSIG:1 Drop(s) In Eye(s) Twice Daily PRN     cyclobenzaprine  (FLEXERIL ) 10 MG tablet Take 1 tablet (10 mg total) by mouth 3 (three) times daily as needed for muscle spasms. 30 tablet 0   desvenlafaxine  (PRISTIQ ) 50 MG 24 hr tablet Take 1 tablet (50 mg total) by mouth daily. 30 tablet 2   diclofenac  (VOLTAREN ) 75 MG EC tablet Take 1 tablet (75 mg total) by mouth 2 (two) times daily as needed (take with food). 60 tablet 1    gabapentin  (NEURONTIN ) 100 MG capsule Take 1 capsule (100 mg total) by mouth at bedtime. 30 capsule 3   hydrOXYzine  (VISTARIL ) 100 MG capsule Take 1 capsule (100 mg total) by mouth 3 (three) times daily as needed for itching. 30 capsule 2   ibuprofen  (ADVIL ) 600 MG tablet Take 1 tablet (600 mg total) by mouth every 6 (six) hours as needed. 30 tablet 0   metFORMIN  (GLUCOPHAGE ) 500 MG tablet Take 1 tablet (500 mg total) by mouth daily with breakfast. 90 tablet 3   Semaglutide , 1 MG/DOSE, 4 MG/3ML SOPN Inject 1 mg as directed once a week. 3 mL 0   No current facility-administered medications on file prior to visit.   "

## 2024-09-11 ENCOUNTER — Other Ambulatory Visit

## 2024-09-16 ENCOUNTER — Ambulatory Visit: Payer: Self-pay | Admitting: Family Medicine

## 2024-09-16 ENCOUNTER — Ambulatory Visit
Admission: RE | Admit: 2024-09-16 | Discharge: 2024-09-16 | Disposition: A | Source: Ambulatory Visit | Attending: Family Medicine | Admitting: Family Medicine

## 2024-09-16 DIAGNOSIS — G8929 Other chronic pain: Secondary | ICD-10-CM

## 2024-09-16 NOTE — Progress Notes (Signed)
X-rays reviewed MyChart message sent

## 2024-09-19 ENCOUNTER — Encounter: Payer: Self-pay | Admitting: *Deleted

## 2024-09-23 ENCOUNTER — Ambulatory Visit

## 2024-09-23 VITALS — BP 112/72 | HR 63 | Temp 98.3°F | Ht 67.0 in | Wt 204.0 lb

## 2024-09-23 DIAGNOSIS — Z7984 Long term (current) use of oral hypoglycemic drugs: Secondary | ICD-10-CM | POA: Diagnosis not present

## 2024-09-23 DIAGNOSIS — Z7985 Long-term (current) use of injectable non-insulin antidiabetic drugs: Secondary | ICD-10-CM

## 2024-09-23 DIAGNOSIS — R200 Anesthesia of skin: Secondary | ICD-10-CM

## 2024-09-23 DIAGNOSIS — Z6831 Body mass index (BMI) 31.0-31.9, adult: Secondary | ICD-10-CM

## 2024-09-23 DIAGNOSIS — E119 Type 2 diabetes mellitus without complications: Secondary | ICD-10-CM | POA: Diagnosis not present

## 2024-09-23 DIAGNOSIS — F321 Major depressive disorder, single episode, moderate: Secondary | ICD-10-CM | POA: Diagnosis not present

## 2024-09-23 DIAGNOSIS — F411 Generalized anxiety disorder: Secondary | ICD-10-CM

## 2024-09-23 DIAGNOSIS — K760 Fatty (change of) liver, not elsewhere classified: Secondary | ICD-10-CM | POA: Diagnosis not present

## 2024-09-23 LAB — POCT GLYCOSYLATED HEMOGLOBIN (HGB A1C): Hemoglobin A1C: 5.8 % — AB (ref 4.0–5.6)

## 2024-09-23 MED ORDER — HYDROXYZINE PAMOATE 100 MG PO CAPS: 100.0000 mg | ORAL_CAPSULE | Freq: Three times a day (TID) | ORAL | 2 refills | Status: AC | PRN

## 2024-09-23 MED ORDER — ROSUVASTATIN CALCIUM 5 MG PO TABS: 2.5000 mg | ORAL_TABLET | Freq: Every day | ORAL | 1 refills | Status: AC

## 2024-09-23 MED ORDER — ONDANSETRON HCL 4 MG PO TABS: 4.0000 mg | ORAL_TABLET | Freq: Three times a day (TID) | ORAL | 0 refills | Status: AC | PRN

## 2024-09-23 MED ORDER — DICLOFENAC SODIUM 75 MG PO TBEC: 75.0000 mg | DELAYED_RELEASE_TABLET | Freq: Two times a day (BID) | ORAL | 1 refills | Status: AC | PRN

## 2024-09-23 MED ORDER — SEMAGLUTIDE (1 MG/DOSE) 4 MG/3ML ~~LOC~~ SOPN: 1.0000 mg | PEN_INJECTOR | SUBCUTANEOUS | 0 refills | Status: DC

## 2024-09-23 MED ORDER — METFORMIN HCL 500 MG PO TABS: 500.0000 mg | ORAL_TABLET | Freq: Every day | ORAL | 3 refills | Status: AC

## 2024-09-23 MED ORDER — GABAPENTIN 100 MG PO CAPS: 100.0000 mg | ORAL_CAPSULE | Freq: Every day | ORAL | 3 refills | Status: AC

## 2024-09-23 MED ORDER — CYCLOBENZAPRINE HCL 10 MG PO TABS: 10.0000 mg | ORAL_TABLET | Freq: Three times a day (TID) | ORAL | 0 refills | Status: AC | PRN

## 2024-09-23 MED ORDER — DESVENLAFAXINE SUCCINATE ER 100 MG PO TB24: 100.0000 mg | ORAL_TABLET | Freq: Every day | ORAL | 3 refills | Status: AC

## 2024-09-23 NOTE — Patient Instructions (Signed)
 VISIT SUMMARY: During your visit, we discussed your weight management, type 2 diabetes, fatty liver disease, peripheral neuropathy, and other health concerns. We reviewed your current medications and made some adjustments to better manage your conditions.  YOUR PLAN: TYPE 2 DIABETES MELLITUS: Your diabetes is well-controlled with a recent A1c of 5.8. -Continue taking semaglutide  at the current dose of 1 mg weekly. -Continue taking metformin  500 mg daily. -Monitor your A1c levels regularly.  NONALCOHOLIC FATTY LIVER DISEASE: You have moderate hepatomegaly and diffuse hepatic steatosis, likely causing elevated liver function tests. -Start taking rosuvastatin  at 2.5 mg, with a potential increase to 5 mg if tolerated. -Monitor your liver function tests. -Consider a liver ultrasound if follow-up imaging is not scheduled. -Continue taking semaglutide  for its liver health benefits.  GENERALIZED ANXIETY DISORDER: Your anxiety is managed with Pristiq , and additional support through therapy is considered. -Increase your dose of Pristiq . -You are referred to therapy for anxiety management.  PERIPHERAL NEUROPATHY: You experience numbness and tingling, primarily at night. -Continue taking gabapentin  with flexibility to take 1-3 capsules at bedtime as needed. -Monitor your symptoms and adjust the dosage as necessary.  If you have any problems before your next visit feel free to message me via MyChart (minor issues or questions) or call the office, otherwise you may reach out to schedule an office visit.  Thank you! Saddie Sacks, PA-C

## 2024-09-25 DIAGNOSIS — K760 Fatty (change of) liver, not elsewhere classified: Secondary | ICD-10-CM | POA: Insufficient documentation

## 2024-09-25 NOTE — Progress Notes (Signed)
 "  Established Patient Office Visit  Subjective   Patient ID: Christina [name], female    DOB: 06-12-1966  Age: 59 y.o. MRN: [id number]  Chief Complaint  Patient presents with   Medical Management of Chronic Issues    HPI  Discussed the use of AI scribe software for clinical note transcription with the patient, who gave verbal consent to proceed.  History of Present Illness   Christina [name] is a 59 year old female with type 2 diabetes who presents for follow-up regarding weight management and fatty liver disease.  Weight management and semaglutide  adverse effects - Currently taking semaglutide  1 mg weekly on Fridays for weight management and T2CM control. - Experiences nausea on the day of and the day after semaglutide  administration - Last Friday, nausea was severe enough to result in a day without eating  Type 2 diabetes mellitus - Managed with metformin  500 mg - Recent hemoglobin A1c is 5.8, indicating good glycemic control  Hepatic steatosis - Moderate hepatomegaly and diffuse hepatic steatosis identified during pancreatic screening MRCP last month - No alcohol consumption; notes that she has never consumed alcohol regularly - Specialist told her to follow up with PCP regarding incidental finding of fatty liver  - Pancreatic screening performed due to genetic predisposition to pancreatic cancer  Peripheral neuropathy symptoms - Manages numbness and tingling with gabapentin , primarily at night as needed - Has only been using the 100 mg dose and has not tried to increase the dose.   Mood - Has been on Pristiq  50 mg for a few months now  - Tolerating medication well and is without side effects - Notes that she has felt more anxious recently   Chronic knee pain and functional impairment - History of knee pain; previously suspected torn meniscus was ruled out [employer] does not cover gel injections - Considering knee replacement surgery with her orthopedic specialist. -  Significant pain with prolonged standing, impacting ability to work        ROS Per HPI.    Objective:     BP 112/72   Pulse 63   Temp 98.3 F (36.8 C) (Oral)   Ht 5' 7 (1.702 m)   Wt 204 lb 0.6 oz (92.6 kg)   LMP  (LMP Unknown)   SpO2 98%   BMI 31.96 kg/m    Physical Exam Constitutional:      General: She is not in acute distress.    Appearance: Normal appearance.  Cardiovascular:     Rate and Rhythm: Normal rate and regular rhythm.     Heart sounds: Normal heart sounds. No murmur heard.    No friction rub. No gallop.  Pulmonary:     Effort: Pulmonary effort is normal. No respiratory distress.     Breath sounds: Normal breath sounds.  Musculoskeletal:        General: No swelling.  Skin:    General: Skin is warm and dry.  Neurological:     General: No focal deficit present.     Mental Status: She is alert.  Psychiatric:        Mood and Affect: Mood normal.        Behavior: Behavior normal.        Thought Content: Thought content normal.      Results for orders placed or performed in visit on 09/23/24  POCT glycosylated hemoglobin (Hb A1C)  Result Value Ref Range   Hemoglobin A1C 5.8 (A) 4.0 - 5.6 %   HbA1c POC (<>  result, manual entry)     HbA1c, POC (prediabetic range)     HbA1c, POC (controlled diabetic range)      Last CBC Lab Results  Component Value Date   WBC 7.3 05/10/2024   HGB 14.1 05/10/2024   HCT 44.5 05/10/2024   MCV 85 05/10/2024   MCH 27.0 05/10/2024   RDW 13.4 05/10/2024   PLT 341 05/10/2024   Last metabolic panel Lab Results  Component Value Date   GLUCOSE 118 (H) 05/10/2024   NA 141 05/10/2024   K 4.9 05/10/2024   CL 105 05/10/2024   CO2 21 05/10/2024   BUN 15 05/10/2024   CREATININE 0.60 05/10/2024   EGFR 105 05/10/2024   CALCIUM  10.3 (H) 05/10/2024   PROT 7.3 05/10/2024   ALBUMIN 4.4 05/10/2024   LABGLOB 2.9 05/10/2024   AGRATIO 1.3 03/30/2023   BILITOT 0.5 05/10/2024   ALKPHOS 88 05/10/2024   AST 63 (H)  05/10/2024   ALT 90 (H) 05/10/2024   ANIONGAP 5 04/19/2024   Last lipids Lab Results  Component Value Date   CHOL 133 05/10/2024   HDL 32 (L) 05/10/2024   LDLCALC 82 05/10/2024   TRIG 99 05/10/2024   CHOLHDL 4.2 05/10/2024   Last hemoglobin A1c Lab Results  Component Value Date   HGBA1C 5.8 (A) 09/23/2024   Last thyroid  functions Lab Results  Component Value Date   TSH 2.860 05/10/2024   T3TOTAL 146 09/16/2021   FREET4 1.49 02/12/2023   Last vitamin D  Lab Results  Component Value Date   VD25OH 21.4 (L) 05/10/2024      The 10-year ASCVD risk score (Arnett DK, et al., 2019) is: 3.5%    Assessment & Plan:   Type 2 diabetes mellitus without complication, without long-term current use of insulin  (HCC) Assessment & Plan: A1c has made significant improvement to 5.8. At goal. Continue Metformin  and Ozempic  1 mg. Foot exam, eye exam, and UACR UTD.  Orders: -     POCT glycosylated hemoglobin (Hb A1C) -     Semaglutide  (1 MG/DOSE); Inject 1 mg as directed once a week.  Dispense: 3 mL; Refill: 0  GAD (generalized anxiety disorder) Assessment & Plan: Generalized anxiety disorder Managed with Pristiq . Therapy considered for additional support. - Increased Pristiq  to higher dose (100 mg) - Referred to therapy for anxiety management.  Orders: -     Ambulatory referral to Psychology  BMI 31.0-31.9,adult Assessment & Plan: Weight currently stable on Ozempic  1 mg weekly.   MDD (major depressive disorder), single episode, moderate (HCC) Assessment & Plan: Managed with Pristiq . Therapy considered for additional support. - Increased Pristiq  to higher dose. - Referred to therapy for anxiety management.   Numbness of toes Assessment & Plan: Symptoms include numbness and tingling with some improvement. Nerve conduction study done October 2025. - Continue gabapentin  with flexibility to take 1-3 capsules at bedtime as needed. - Monitor symptoms and adjust dosage as  necessary.    Nonalcoholic fatty liver disease Assessment & Plan: Nonalcoholic fatty liver disease Moderate hepatomegaly and diffuse hepatic steatosis. Elevated liver function tests likely due to fatty liver. Semaglutide  will benefit liver health. - Started rosuvastatin  at 2.5 mg, potential increase to 5 mg if tolerated after 1-2 weeks. - Monitor liver function tests. - Consider liver ultrasound if follow-up imaging is not scheduled. - Continue semaglutide  for liver health benefits.   Other orders -     Rosuvastatin  Calcium ; Take 0.5 tablets (2.5 mg total) by mouth daily.  Dispense: 90 tablet;  Refill: 1 -     Ondansetron  HCl; Take 1 tablet (4 mg total) by mouth every 8 (eight) hours as needed for nausea or vomiting.  Dispense: 20 tablet; Refill: 0 -     Desvenlafaxine  Succinate ER; Take 1 tablet (100 mg total) by mouth daily.  Dispense: 90 tablet; Refill: 3 -     metFORMIN  HCl; Take 1 tablet (500 mg total) by mouth daily with breakfast.  Dispense: 90 tablet; Refill: 3 -     hydrOXYzine  Pamoate; Take 1 capsule (100 mg total) by mouth 3 (three) times daily as needed for itching.  Dispense: 30 capsule; Refill: 2 -     Gabapentin ; Take 1 capsule (100 mg total) by mouth at bedtime.  Dispense: 30 capsule; Refill: 3 -     Cyclobenzaprine  HCl; Take 1 tablet (10 mg total) by mouth 3 (three) times daily as needed for muscle spasms.  Dispense: 30 tablet; Refill: 0 -     Diclofenac  Sodium; Take 1 tablet (75 mg total) by mouth 2 (two) times daily as needed (take with food).  Dispense: 60 tablet; Refill: 1    Return in about 6 weeks (around 11/04/2024) for DM, HLD, Mood.    Saddie JULIANNA Sacks, PA-C "

## 2024-09-25 NOTE — Assessment & Plan Note (Signed)
 Generalized anxiety disorder Managed with Pristiq . Therapy considered for additional support. - Increased Pristiq  to higher dose (100 mg) - Referred to therapy for anxiety management.

## 2024-09-25 NOTE — Assessment & Plan Note (Signed)
 Nonalcoholic fatty liver disease Moderate hepatomegaly and diffuse hepatic steatosis. Elevated liver function tests likely due to fatty liver. Semaglutide  will benefit liver health. - Started rosuvastatin  at 2.5 mg, potential increase to 5 mg if tolerated after 1-2 weeks. - Monitor liver function tests. - Consider liver ultrasound if follow-up imaging is not scheduled. - Continue semaglutide  for liver health benefits.

## 2024-09-25 NOTE — Assessment & Plan Note (Signed)
 Managed with Pristiq . Therapy considered for additional support. - Increased Pristiq  to higher dose. - Referred to therapy for anxiety management.

## 2024-09-25 NOTE — Assessment & Plan Note (Signed)
 Weight currently stable on Ozempic  1 mg weekly.

## 2024-09-25 NOTE — Assessment & Plan Note (Signed)
 A1c has made significant improvement to 5.8. At goal. Continue Metformin  and Ozempic  1 mg. Foot exam, eye exam, and UACR UTD.

## 2024-09-25 NOTE — Assessment & Plan Note (Signed)
 Symptoms include numbness and tingling with some improvement. Nerve conduction study done October 2025. - Continue gabapentin  with flexibility to take 1-3 capsules at bedtime as needed. - Monitor symptoms and adjust dosage as necessary.

## 2024-10-04 ENCOUNTER — Ambulatory Visit (INDEPENDENT_AMBULATORY_CARE_PROVIDER_SITE_OTHER): Admitting: Family Medicine

## 2024-10-04 ENCOUNTER — Encounter: Payer: Self-pay | Admitting: Family Medicine

## 2024-10-04 ENCOUNTER — Other Ambulatory Visit: Payer: Self-pay

## 2024-10-04 VITALS — BP 110/74 | Ht 67.0 in | Wt 199.0 lb

## 2024-10-04 DIAGNOSIS — M7660 Achilles tendinitis, unspecified leg: Secondary | ICD-10-CM

## 2024-10-04 DIAGNOSIS — M7661 Achilles tendinitis, right leg: Secondary | ICD-10-CM | POA: Diagnosis present

## 2024-10-04 DIAGNOSIS — M1712 Unilateral primary osteoarthritis, left knee: Secondary | ICD-10-CM | POA: Insufficient documentation

## 2024-10-04 MED ORDER — NITROGLYCERIN 0.2 MG/HR TD PT24: MEDICATED_PATCH | TRANSDERMAL | 1 refills | Status: AC

## 2024-10-04 NOTE — Assessment & Plan Note (Signed)
 Ongoing left knee pain with underlying osteoarthritis, limited improvement with prior cortisone and Toradol  injections.  Was not approved for hyaluronic acid injections - Prior MRI results reviewed as noted above - Would be candidate for geniculate nerve blocks.  Will get her scheduled with Dr. Redell Reid in our office for consideration of this. - Did discuss possible low-dose radiation therapy as well, but will attempt geniculate nerve blocks first - She will follow-up with me as noted above, should plan to see Dr. Reid as discussed.

## 2024-10-04 NOTE — Patient Instructions (Signed)
 Nitroglycerin Protocol  Apply 1/4 nitroglycerin patch to affected area daily. Change position of patch within the affected area every 24 hours. You may experience a headache during the first 1-2 weeks of using the patch, these should subside. If you experience headaches after beginning nitroglycerin patch treatment, you may take your preferred over the counter pain reliever. Another side effect of the nitroglycerin patch is skin irritation or rash related to patch adhesive. Please notify our office if you develop more severe headaches or rash, and stop the patch. Tendon healing with nitroglycerin patch may require 12 to 24 weeks depending on the extent of injury. Men should not use if taking Viagra, Cialis, or Levitra.  Do not use if you have migraines or rosacea.

## 2024-10-04 NOTE — Progress Notes (Signed)
 DATE OF VISIT: 10/04/2024        Christina [name] DOB: 11-25-1965 MRN: [id number]  CC:  Rt achilles pain, f/u left knee pain  History of present Illness: Christina [name] is a 59 y.o. female who presents for a follow-up visit left knee pain, now new right Achilles pain  Last seen by me 09/06/2024 for left knee pain, underwent ultrasound-guided Toradol  injection at that time. Toradol  was not overly helpful. Continues to have ongoing pain Attempted authorization for hyaluronic acid, but not approved by her insurance.  She is unable to pay out-of-pocket. Had MRI of the left knee confirming osteoarthritis  Today she complains of new right Achilles pain Reports remote history of Achilles surgery for partial tearing and spur many years ago. Acute pain started 3 to 4 days ago, no new injury or trauma Feels pain along the back of her foot Has been limping No improvement with ibuprofen   Denies adhesive allergy  Denies any history of headaches or migraines   Medications:  Outpatient Encounter Medications as of 10/04/2024  Medication Sig   nitroGLYCERIN  (NITRODUR - DOSED IN MG/24 HR) 0.2 mg/hr patch Use 1/4 patch daily to the affected area.   cromolyn (OPTICROM) 4 % ophthalmic solution SMARTSIG:1 Drop(s) In Eye(s) Twice Daily PRN   cyclobenzaprine  (FLEXERIL ) 10 MG tablet Take 1 tablet (10 mg total) by mouth 3 (three) times daily as needed for muscle spasms.   desvenlafaxine  (PRISTIQ ) 100 MG 24 hr tablet Take 1 tablet (100 mg total) by mouth daily.   diclofenac  (VOLTAREN ) 75 MG EC tablet Take 1 tablet (75 mg total) by mouth 2 (two) times daily as needed (take with food).   gabapentin  (NEURONTIN ) 100 MG capsule Take 1 capsule (100 mg total) by mouth at bedtime.   hydrOXYzine  (VISTARIL ) 100 MG capsule Take 1 capsule (100 mg total) by mouth 3 (three) times daily as needed for itching.   metFORMIN  (GLUCOPHAGE ) 500 MG tablet Take 1 tablet (500 mg total) by mouth daily with breakfast.   ondansetron   (ZOFRAN ) 4 MG tablet Take 1 tablet (4 mg total) by mouth every 8 (eight) hours as needed for nausea or vomiting.   rosuvastatin  (CRESTOR ) 5 MG tablet Take 0.5 tablets (2.5 mg total) by mouth daily.   Semaglutide , 1 MG/DOSE, 4 MG/3ML SOPN Inject 1 mg as directed once a week.   No facility-administered encounter medications on file as of 10/04/2024.    Allergies: has no known allergies.  Physical Examination: Vitals: BP 110/74   Ht 5' 7 (1.702 m)   Wt 199 lb (90.3 kg)   LMP  (LMP Unknown)   BMI 31.17 kg/m  GENERAL:  Christina [name] is a 59 y.o. female appearing their stated age, alert and oriented x 3, in no apparent distress.  SKIN: no rashes or lesions, skin clean, dry, intact MSK: Left knee without swelling or effusion. Foot/ankle: Right foot and ankle with mild soft tissue swelling along the distal Achilles.  No increased redness or warmth.  Tender to palpation of the insertion of the Achilles on the calcaneus, but also some tenderness more proximal.  Achilles does feel thickened.  No palpable defects.  Decreased dorsiflexion due to pain, good plantarflexion.  Left foot and ankle with good range of motion without pain.  No Achilles tenderness. Walking with an antalgic gait Neurovascular intact distally  Radiology: Limited MSK ultrasound right Achilles Date: 10/04/2024 Indication: Right Achilles pain Findings: - Multiple calcifications noted at the insertion of the Achilles on the calcaneus.  Does have associated acoustic shadowing. - Achilles is quite thickened measuring approximately 1.1 cm.  Associated hypoechoic changes and partial tearing is noted.  No full-thickness tearing.  Significantly increased Doppler flow and hyperemia is noted.  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 There is a small reactive joint effusion. - There is no fracture or acute abnormality. Ligaments are intact. - Slight blunting of the posterior horn the medial meniscus without evidence of a discrete medial meniscal tear.  Assessment & Plan Insertional tendinopathy of right Achilles tendon Acute on chronic right Achilles pain, prior history of Achilles tendon surgery many years ago, MSK ultrasound showing Illes thickening, hypoechoic change, associated calcific tendinopathy  Plan: - MSK ultrasound completed as noted above - Recommend topical nitroglycerin  therapy.  She has no contraindications.  Explained how to take medication common side effects.  Advised most common side effect is headache, can use Tylenol  or NSAIDs as needed.  If headache too bothersome can remove the patch and let us  know. - Was fitted with a short cam boot with a heel lift to unload the Achilles and help with her ambulation over the next 1 to 2 weeks to calm down the inflammation. - Was given home exercise program, can start in the next 1 to 2 weeks as she is feeling better - Follow-up 4 to 6 weeks for reevaluation, sooner as needed.  Did discuss possible role of shockwave therapy in the future, but may be cost prohibitive since this is out-of-pocket. Primary osteoarthritis of left knee Ongoing left knee pain with underlying osteoarthritis, limited improvement with prior cortisone and Toradol  injections.  Was not approved for hyaluronic acid injections - Prior MRI results reviewed as noted above - Would be candidate for geniculate nerve blocks.  Will get her scheduled with Dr. Redell Reid in our office for consideration of this. - Did discuss possible low-dose radiation therapy as well, but will attempt geniculate nerve blocks first - She will follow-up with me as noted above, should plan to see Dr. Reid  as discussed.   Patient expressed understanding & agreement with above.  Encounter Diagnoses  Name Primary?   Insertional tendinopathy of right Achilles tendon Yes   Primary osteoarthritis of left knee     Orders Placed This Encounter  Procedures   US  LIMITED JOINT SPACE STRUCTURES LOW LEFT

## 2024-10-07 ENCOUNTER — Other Ambulatory Visit: Payer: Self-pay

## 2024-10-07 ENCOUNTER — Ambulatory Visit (INDEPENDENT_AMBULATORY_CARE_PROVIDER_SITE_OTHER): Admitting: Pain Medicine

## 2024-10-07 VITALS — BP 124/84 | Ht 67.0 in | Wt 199.0 lb

## 2024-10-07 DIAGNOSIS — M1712 Unilateral primary osteoarthritis, left knee: Secondary | ICD-10-CM

## 2024-10-07 NOTE — Progress Notes (Unsigned)
 Patient seen by myself after referral from Dr. Teressa.  Agree with attempting left knee genicular blocks for L knee OA  PRE-OP DIAGNOSIS: Left knee osteoarthritis PROCEDURE: Left knee genicular nerve blocks under US  guidance Performing Physician: Redell Reid, MD   Total Dose:       bupivacaine  0.5%      1.5  mL     R/B/A of procedure provided.  Risks of infection, bleeding, damage to other structures and nerves explained.    Procedure: The area was prepped in the usual sterile manner with chlorhexidine . Sterile ultrasound gel was applied.  Ultrasound was used to identify the femoral shaft and condyles, along with the tibia shaft and head on the side noted above.  The inflection points were noted at the areas of the superior medial, superior lateral, and inferomedial genicular nerves.  A 25g 3.5 inch Quinky spinal needle was inserted through the skin and to the area of the nerves.  After negative aspiration, 0.5 ml of the above solution was injected at each of the 3 sites above.   There were no complications during this procedure.   Patient reported mild relief upon standing after the procedure.  No weakness noted.   Followup: The patient tolerated the procedure well without complications.  Standard post-procedure care is explained and return precautions are given.

## 2024-10-09 ENCOUNTER — Other Ambulatory Visit: Payer: Self-pay

## 2024-10-09 DIAGNOSIS — E119 Type 2 diabetes mellitus without complications: Secondary | ICD-10-CM

## 2024-10-10 ENCOUNTER — Ambulatory Visit: Admitting: Licensed Clinical Social Worker

## 2024-10-10 DIAGNOSIS — F419 Anxiety disorder, unspecified: Secondary | ICD-10-CM

## 2024-10-10 NOTE — Progress Notes (Signed)
..  A user error has taken place:   patient shared that they had been ready for session but received a message the provider was running late, provider was not late but patient icon had not indicated session entry until 3:12 pm, session began, patient had a 4 and 57 yo child in the home and no ear buds for privacy addressed and session continued, patient at one point left the home and went outside with someone (unable to see) standing at the door behind them and then ended the session, provider called three times and patient did not answer or return to session, provider ended session and notes at 3:33 pm.

## 2024-10-11 DIAGNOSIS — E119 Type 2 diabetes mellitus without complications: Secondary | ICD-10-CM

## 2024-10-11 MED ORDER — OZEMPIC (1 MG/DOSE) 4 MG/3ML ~~LOC~~ SOPN: 1.0000 mg | PEN_INJECTOR | SUBCUTANEOUS | 2 refills | Status: AC

## 2024-10-21 ENCOUNTER — Ambulatory Visit: Admitting: Pain Medicine

## 2024-10-28 ENCOUNTER — Other Ambulatory Visit

## 2024-11-04 ENCOUNTER — Ambulatory Visit

## 2024-11-15 ENCOUNTER — Ambulatory Visit: Admitting: Physician Assistant
